# Patient Record
Sex: Male | Born: 1945 | Race: White | Hispanic: No | Marital: Married | Smoking: Former smoker
Health system: Southern US, Community
[De-identification: ages and names within clinical notes are randomized; demographics above are authoritative.]

## PROBLEM LIST (undated history)

## (undated) DIAGNOSIS — Z95810 Presence of automatic (implantable) cardiac defibrillator: Secondary | ICD-10-CM

## (undated) DIAGNOSIS — I428 Other cardiomyopathies: Secondary | ICD-10-CM

## (undated) DIAGNOSIS — I499 Cardiac arrhythmia, unspecified: Secondary | ICD-10-CM

## (undated) DIAGNOSIS — K432 Incisional hernia without obstruction or gangrene: Secondary | ICD-10-CM

## (undated) DIAGNOSIS — I4819 Other persistent atrial fibrillation: Secondary | ICD-10-CM

## (undated) DIAGNOSIS — I219 Acute myocardial infarction, unspecified: Secondary | ICD-10-CM

## (undated) DIAGNOSIS — I2510 Atherosclerotic heart disease of native coronary artery without angina pectoris: Secondary | ICD-10-CM

## (undated) DIAGNOSIS — F32A Depression, unspecified: Secondary | ICD-10-CM

## (undated) DIAGNOSIS — Z950 Presence of cardiac pacemaker: Secondary | ICD-10-CM

## (undated) DIAGNOSIS — I509 Heart failure, unspecified: Secondary | ICD-10-CM

## (undated) DIAGNOSIS — I739 Peripheral vascular disease, unspecified: Secondary | ICD-10-CM

## (undated) DIAGNOSIS — N189 Chronic kidney disease, unspecified: Secondary | ICD-10-CM

## (undated) DIAGNOSIS — E785 Hyperlipidemia, unspecified: Secondary | ICD-10-CM

## (undated) DIAGNOSIS — N529 Male erectile dysfunction, unspecified: Secondary | ICD-10-CM

## (undated) DIAGNOSIS — F419 Anxiety disorder, unspecified: Secondary | ICD-10-CM

## (undated) DIAGNOSIS — D649 Anemia, unspecified: Secondary | ICD-10-CM

## (undated) DIAGNOSIS — F329 Major depressive disorder, single episode, unspecified: Secondary | ICD-10-CM

## (undated) DIAGNOSIS — I255 Ischemic cardiomyopathy: Secondary | ICD-10-CM

## (undated) DIAGNOSIS — K219 Gastro-esophageal reflux disease without esophagitis: Secondary | ICD-10-CM

## (undated) DIAGNOSIS — IMO0001 Reserved for inherently not codable concepts without codable children: Secondary | ICD-10-CM

## (undated) HISTORY — DX: Other cardiomyopathies: I42.8

## (undated) HISTORY — DX: Other persistent atrial fibrillation: I48.19

## (undated) HISTORY — DX: Hyperlipidemia, unspecified: E78.5

## (undated) HISTORY — DX: Major depressive disorder, single episode, unspecified: F32.9

## (undated) HISTORY — PX: CORONARY STENT PLACEMENT: SHX1402

## (undated) HISTORY — DX: Depression, unspecified: F32.A

## (undated) HISTORY — DX: Male erectile dysfunction, unspecified: N52.9

## (undated) HISTORY — PX: MULTIPLE TOOTH EXTRACTIONS: SHX2053

## (undated) HISTORY — DX: Ischemic cardiomyopathy: I25.5

## (undated) HISTORY — DX: Chronic kidney disease, unspecified: N18.9

## (undated) HISTORY — PX: BREAST LUMPECTOMY: SHX2

## (undated) HISTORY — PX: INSERT / REPLACE / REMOVE PACEMAKER: SUR710

## (undated) HISTORY — DX: Acute myocardial infarction, unspecified: I21.9

## (undated) HISTORY — DX: Atherosclerotic heart disease of native coronary artery without angina pectoris: I25.10

## (undated) NOTE — *Deleted (*Deleted)
Discharge Summary  [name] VHQ:[id number] DOB: [birth year]  PCP: Gwenlyn Found, MD  Admit date: 08/25/2020 Discharge date: 09/26/2020  Time spent: , more than 50% time spent on coordination of care.   Recommendations for Outpatient Follow-up:  1. F/u with PCP within a week  for hospital discharge follow up, repeat cbc/bmp at follow up 2. Insurance denied inpatient rehab, patient declined snf placement 3. Maximize home health /home equipment  Discharge Diagnoses:  Active Hospital Problems   Diagnosis Date Noted  . Pneumonia due to COVID-19 virus 08/26/2020  . Acute respiratory failure with hypoxia (HCC)   . CKD (chronic kidney disease) stage 3, GFR 30-59 ml/min (HCC) 02/18/2016  . Nonischemic cardiomyopathy (HCC)   . Sleep apnea 04/05/2011  . CAD S/P LAD PCI- 2000 03/09/2010  . Essential hypertension 03/09/2010  . Atrial fibrillation (HCC) 03/09/2010  . Hyperlipidemia 03/09/2010  . Chronic systolic heart failure (HCC) 03/09/2010    Resolved Hospital Problems  No resolved problems to display.    Discharge Condition: stable  Diet recommendation: heart healthy/carb modified  Filed Weights   09/23/20 0525 09/25/20 0425 09/26/20 0500  Weight: 79.4 kg 79.8 kg 79.9 kg    History of present illness:  [facility] Course:  Principal Problem:   Pneumonia due to COVID-19 virus Active Problems:   Essential hypertension   CAD S/P LAD PCI- 2000   Atrial fibrillation (HCC)   Chronic systolic heart failure (HCC)   Sleep apnea   Nonischemic cardiomyopathy (HCC)   CKD (chronic kidney disease) stage 3, GFR 30-59 ml/min (HCC)   Acute respiratory failure with hypoxia (HCC)   Hyperlipidemia   Procedures:  *  Consultations:  *  Discharge Exam: BP 104/61 (BP Location: Right Arm)   Pulse 69   Temp (!) 97.4 F (36.3 C) (Oral)   Resp 16   Ht 5\' 10"  (1.778 m)   Wt 79.9 kg   SpO2 98%   BMI 25.27 kg/m   General: * Cardiovascular: * Respiratory: *   Discharge Instructions You were cared for by a hospitalist during your hospital stay. If you have any questions about your discharge medications or the care you received while you were in the hospital after you are discharged, you can call the unit and asked to speak with the hospitalist on call if the hospitalist that took care of you is not available. Once you are discharged, your primary care physician will handle any further medical issues. Please note that NO REFILLS for any discharge medications will be authorized once you are discharged, as it is imperative that you return to your primary care physician (or establish a relationship with a primary care physician if you do not have one) for your aftercare needs so that they can reassess your need for medications and monitor your lab values.  Discharge Instructions    Diet - low sodium heart healthy   Complete by: As directed    Increase activity slowly   Complete by: As directed      Allergies as of 09/26/2020      Reactions   Keflex [cephalexin] Other (See Comments)   dizziness    Med Rec must be completed prior to using this [facility]***        Durable Medical Equipment  (From admission, onward)         Start     Ordered   09/26/20 0931  DME 3-in-1  Once        09/26/20 0930  09/26/20 0930  DME Walker  Once       Question Answer Comment  Walker: With 5 Inch Wheels   Patient needs a walker to treat with the following condition SOB (shortness of breath)      09/26/20 0930   09/26/20 0925  For home use only DME oxygen  Once       Question Answer Comment  Length of Need 6 Months   Frequency Continuous (stationary and portable oxygen unit needed)   Oxygen conserving device Yes   Oxygen delivery system Gas      09/26/20 0924         Allergies  Allergen Reactions  . Keflex [Cephalexin] Other (See Comments)    dizziness    Follow-up Information    Home[facility] Follow up.   Specialty: Home Health Services  Why: HHPT Contact information: [address] [place] [address] [phone]        Gwenlyn Found, MD Follow up in 1 week(s).   Specialty: Family Medicine Why: hospital discharge follow up, repeat basic labs ( cbc/bmp ) at hospital discharge follow up pcp to repeat chest x ray in 2-3 weeks to ensure resolution of lung infiltrate.  Contact information: [address] [place] [address] [phone]        [place], Peter M, MD .   Specialty: Cardiology Contact information: [address] [place] [address] [phone]        Duke Salvia, MD .   Specialty: Cardiology Contact information: [phone] [address] [place] [address] [phone]                The results of significant diagnostics from this hospitalization (including imaging, microbiology, ancillary and laboratory) are listed below for reference.    Significant Diagnostic Studies: CT ANGIO CHEST PE W OR WO CONTRAST  Result Date: 09/01/2020 CLINICAL DATA:  COVID positive, positive D-dimer, PE suspected. EXAM: CT ANGIOGRAPHY CHEST WITH CONTRAST TECHNIQUE: Multidetector CT imaging of the chest was performed using the standard protocol during bolus administration of intravenous contrast. Multiplanar CT image reconstructions and MIPs were obtained to evaluate the vascular anatomy. CONTRAST:  OMNIPAQUE IOHEXOL 350 MG/ML SOLN COMPARISON:  Chest x-ray 08/31/2020, CT angio chest 02/22/2016 FINDINGS: Lines: Right chest wall three lead cardiac defibrillator and pacemaker. Cardiovascular: Satisfactory opacification of the pulmonary arteries to the segmental level. No evidence of pulmonary embolism. Prominent right atrium and left ventricle. No significant pericardial effusion. At least moderate 4 vessel coronary artery calcifications. 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 Mediastinum/Nodes: Palpable of prominent, likely reactive mediastinal lymph nodes with as an example a 1.2 cm right paratracheal lymph node (6:101) and a 1 cm precarinal lymph node on the left (6:109). There is suggestion of a 1 cm right infrahilar lymph node (6:159). As well as suggestion of a prominent left infrahilar lymph node (05/29/2040). No axillary lymph nodes. Thyroid gland, trachea, and esophagus demonstrate no significant findings. Lungs/Pleura: Diffuse, bilateral lower lobe and left upper lobe predominant, peripheral and peribronchovascular patchy and consolidative airspace opacities. No pleural effusion or pneumothorax bilaterally. Upper Abdomen: Partially visualized 4.1 cm fluid density lesion within the left kidney likely represents a renal cyst. Musculoskeletal: No chest wall abnormality No suspicious lytic or blastic osseous lesions. No acute displaced fracture. Multilevel degenerative changes of the spine. Review of the MIP images confirms  the above findings. IMPRESSION: 1. No central or segmental pulmonary embolus. Limited evaluation of the subsegmental level due to timing of contrast. 2. Diffuse pulmonary findings consistent with COVID infection with likely reactive infrahilar and mediastinal lymphadenopathy. 3. 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 4.  Aortic Atherosclerosis (ICD10-I70.0). Electronically Signed   By: Tish Frederickson M.D.   On: 09/01/2020 21:51   DG CHEST PORT 1 VIEW  Result Date: 09/08/2020 CLINICAL DATA:  Shortness of breath, COVID-19 positive EXAM: PORTABLE CHEST 1 VIEW COMPARISON:  08/31/2020, 09/01/2020 FINDINGS: Right-sided implanted cardiac device, unchanged. Cardiomegaly. Aortic atherosclerosis. Diffuse airspace consolidation throughout the left lung has become slightly more consolidative compared to the prior x-ray. Progressive predominantly interstitial opacities  throughout the right lung. No large pleural fluid collection. No pneumothorax identified. IMPRESSION: Slight interval worsening of diffuse bilateral airspace opacities, left greater than right. Electronically Signed   By: Duanne Guess D.O.   On: 09/08/2020 10:07   DG CHEST PORT 1 VIEW  Result Date: 08/31/2020 CLINICAL DATA:  Dyspnea. EXAM: PORTABLE CHEST 1 VIEW COMPARISON:  08/25/2020 FINDINGS: Right chest wall ICD is noted with leads in the right atrial appendage, coronary sinus and right ventricle. Stable cardiomediastinal contours. Aortic atherosclerotic calcifications noted. Diffuse airspace and interstitial opacities in the left lung have increased in the interval. New, diffuse interstitial opacities within the right lung are noted on today's exam. IMPRESSION: 1. Progressive interstitial and airspace opacities throughout the left lung with new diffuse interstitial opacities in the right lung. Electronically Signed   By: Signa Kell M.D.   On: 08/31/2020 09:36   VAS [place] LOWER EXTREMITY VENOUS (DVT)  Result Date: 09/01/2020  Lower Venous DVTStudy Indications: Elevated D dimer.  Risk Factors: + Covid. Performing Technologist: Jannet Askew RCT RDMS  Examination Guidelines: A complete evaluation includes B-mode imaging, spectral Doppler, color Doppler, and power Doppler as needed of all accessible portions of each vessel. Bilateral testing is considered an integral part of a complete examination. Limited examinations for reoccurring indications may be performed as noted. The reflux portion of the exam is performed with the patient in reverse Trendelenburg.  +---------+---------------+---------+-----------+----------+--------------+ RIGHT    CompressibilityPhasicitySpontaneityPropertiesThrombus Aging +---------+---------------+---------+-----------+----------+--------------+ CFV      Full           Yes      Yes                                  +---------+---------------+---------+-----------+----------+--------------+ SFJ      Full                                                        +---------+---------------+---------+-----------+----------+--------------+ FV Prox  Full                                                        +---------+---------------+---------+-----------+----------+--------------+ FV Mid   Full                                                        +---------+---------------+---------+-----------+----------+--------------+  FV DistalFull                                                        +---------+---------------+---------+-----------+----------+--------------+ PFV      Full                                                        +---------+---------------+---------+-----------+----------+--------------+ POP      Full           Yes      Yes                                 +---------+---------------+---------+-----------+----------+--------------+ PTV      Full                                                        +---------+---------------+---------+-----------+----------+--------------+ PERO     None                                         Acute          +---------+---------------+---------+-----------+----------+--------------+   +---------+---------------+---------+-----------+----------+--------------+ LEFT     CompressibilityPhasicitySpontaneityPropertiesThrombus Aging +---------+---------------+---------+-----------+----------+--------------+ CFV      Full           Yes      Yes                                 +---------+---------------+---------+-----------+----------+--------------+ SFJ      Full                                                        +---------+---------------+---------+-----------+----------+--------------+ FV Prox  Full                                                         +---------+---------------+---------+-----------+----------+--------------+ FV Mid   Full                                                        +---------+---------------+---------+-----------+----------+--------------+ FV DistalFull                                                        +---------+---------------+---------+-----------+----------+--------------+  PFV      Full                                                        +---------+---------------+---------+-----------+----------+--------------+ POP      Full           Yes      Yes                                 +---------+---------------+---------+-----------+----------+--------------+ PTV      Full                                                        +---------+---------------+---------+-----------+----------+--------------+ PERO     None                                         Acute          +---------+---------------+---------+-----------+----------+--------------+     Summary: RIGHT: - Findings consistent with acute deep vein thrombosis involving the right peroneal veins. - No cystic structure found in the popliteal fossa.  LEFT: - Findings consistent with acute deep vein thrombosis involving the left peroneal veins. - No cystic structure found in the popliteal fossa.  *See table(s) above for measurements and observations. Electronically signed by Gretta Began MD on 09/01/2020 at 5:00:44 PM.    Final    ECHOCARDIOGRAM LIMITED  Result Date: 09/17/2020    ECHOCARDIOGRAM LIMITED REPORT   Patient Name:   TRAEGER SULTANA [facility] Date of Exam: 09/17/2020 Medical Rec #:  [id number]       Height:       70.0 in Accession #:    [id number]      Weight:       180.8 lb Date of Birth:  08-13-1946       BSA:          2.000 m Patient Age:    73 years        BP:           104/59 mmHg Patient Gender: M               HR:           72 bpm. Exam Location:  Inpatient Procedure: Limited Echo, Intracardiac Opacification Agent, Cardiac  Doppler and            Limited Color Doppler Indications:    Acute Respiratory Insufficiency 518.82 / R06.89  History:        Patient has prior history of Echocardiogram examinations, most                 recent 10/01/2017. CHF, CAD and Previous Myocardial Infarction,                 Defibrillator, PAD, Arrythmias:Atrial Fibrillation; Risk                 Factors:Dyslipidemia. Rencent COVID pneumon, Ischemic  cardiomyopathy, Chronic renal insufficiency.  Sonographer:    Leta Jungling RDCS Referring Phys: [id number] [facility]  Sonographer Comments: Technically challenging study due to limited acoustic windows. Image acquisition challenging due to respiratory motion. IMPRESSIONS  1. There is akinesis of all mid-to-distal septal, mid-to-distal inferior, mid-to-distal anterior and apical segments. All other segments are severely hypokinetic.Marland Kitchen Left ventricular ejection fraction, by estimation, is 25 to 30%. The left ventricle has severely decreased function. The left ventricular internal cavity size was mildly dilated.  2. Right ventricular systolic function is normal. The right ventricular size is normal. There is mildly elevated pulmonary artery systolic pressure.  3. The aortic valve is tricuspid. There is mild calcification of the aortic valve. There is mild thickening of the aortic valve. Aortic valve regurgitation is not visualized. Comparison(s): No significant change from prior study. Compared to prior TTE in 2018, there is no significant change. FINDINGS  Left Ventricle: There is akinesis of all mid-to-distal septal, mid-to-distal inferior, mid-to-distal anterior and apical segments.All other segments are severely hypokinetic. Left ventricular ejection fraction, by estimation, is 25 to 30%. The left ventricle has severely decreased function. The left ventricular internal cavity size was moderately dilated. There is no left ventricular hypertrophy. Right Ventricle: The right ventricular size is  normal. Right ventricular systolic function is normal. There is mildly elevated pulmonary artery systolic pressure. The tricuspid regurgitant velocity is 2.76 m/s, and with an assumed right atrial pressure of 8 mmHg, the estimated right ventricular systolic pressure is 38.5 mmHg. Mitral Valve: There is mild thickening of the mitral valve leaflet(s). Mild mitral annular calcification. Tricuspid Valve: The tricuspid valve is normal in structure. Tricuspid valve regurgitation is mild. Aortic Valve: The aortic valve is tricuspid. There is mild calcification of the aortic valve. There is mild thickening of the aortic valve. Aortic valve regurgitation is not visualized. Pulmonic Valve: The pulmonic valve was grossly normal. Pulmonic valve regurgitation is trivial. Aorta: The aortic root and ascending aorta are structurally normal, with no evidence of dilitation. Additional Comments: A pacer wire is visualized. LEFT VENTRICLE PLAX 2D LVIDd:         6.30 cm      Diastology LVIDs:         4.80 cm      LV e' medial:    6.43 cm/s LV PW:         0.90 cm      LV E/e' medial:  9.5 LV IVS:        0.90 cm      LV e' lateral:   5.40 cm/s                             LV E/e' lateral: 11.3  LV Volumes (MOD) LV vol d, MOD A2C: 184.0 ml LV vol d, MOD A4C: 193.0 ml LV vol s, MOD A2C: 136.0 ml LV vol s, MOD A4C: 143.0 ml LV SV MOD A2C:     48.0 ml LV SV MOD A4C:     193.0 ml LV SV MOD BP:      50.0 ml LEFT ATRIUM         Index LA diam:    4.10 cm 2.05 cm/m  AORTIC VALVE LVOT Vmax:   75.10 cm/s LVOT Vmean:  46.400 cm/s LVOT VTI:    0.096 m MITRAL VALVE               TRICUSPID VALVE MV Area (PHT): 4.14 cm  TR Peak grad:   30.5 mmHg MV Decel Time: 183 msec    TR Vmax:        276.00 cm/s MV E velocity: 61.13 cm/s                            SHUNTS                            Systemic VTI: 0.10 m Laurance Flatten MD Electronically signed by Laurance Flatten MD Signature Date/Time: 09/17/2020/4:36:40 PM    Final     Microbiology: No  results found for this or any previous visit (from the past 240 hour(s)).   Labs: Basic Metabolic Panel: Recent Labs  Lab 09/22/20 0536 09/23/20 0731 09/24/20 0619 09/25/20 0324 09/26/20 0658  NA 142 141 142 140 142  K 3.6 3.8 3.3* 3.0* 3.7  CL 99 99 99 97* 99  CO2 30 27 31 31  32  GLUCOSE 88 88 95 108* 96  BUN 21 16 21 23 21   CREATININE 1.32* 1.40* 1.47* 1.46* 1.52*  CALCIUM 9.5 9.4 9.4 9.4 9.2   Liver Function Tests: No results for input(s): AST, ALT, ALKPHOS, BILITOT, PROT, ALBUMIN in the last 168 hours. No results for input(s): LIPASE, AMYLASE in the last 168 hours. No results for input(s): AMMONIA in the last 168 hours. CBC: Recent Labs  Lab 09/22/20 0536 09/23/20 0952 09/24/20 0619 09/25/20 0324 09/26/20 0658  WBC 8.3 8.7 9.1 8.4 7.8  HGB 11.8* 11.8* 12.1* 11.9* 11.5*  HCT 38.0* 38.9* 38.7* 38.9* 38.0*  MCV 82.8 81.7 82.2 81.7 82.3  PLT 340 237 375 372 385   Cardiac Enzymes: No results for input(s): CKTOTAL, CKMB, CKMBINDEX, TROPONINI in the last 168 hours. BNP: BNP (last 3 results) Recent Labs    08/25/20 1813  BNP 429.0*    ProBNP (last 3 results) No results for input(s): PROBNP in the last 8760 hours.  CBG: No results for input(s): GLUCAP in the last 168 hours.     Signed:  Albertine Grates MD, PhD, FACP  [facility] 09/26/2020, 9:30 AM

---

## 1970-12-19 HISTORY — PX: FRACTURE SURGERY: SHX138

## 1970-12-19 HISTORY — PX: WRIST FRACTURE SURGERY: SHX121

## 1998-12-19 DIAGNOSIS — I219 Acute myocardial infarction, unspecified: Secondary | ICD-10-CM

## 1998-12-19 HISTORY — PX: CORONARY ANGIOPLASTY: SHX604

## 1998-12-19 HISTORY — DX: Acute myocardial infarction, unspecified: I21.9

## 2006-04-05 ENCOUNTER — Encounter (INDEPENDENT_AMBULATORY_CARE_PROVIDER_SITE_OTHER): Payer: Self-pay | Admitting: *Deleted

## 2006-04-05 ENCOUNTER — Ambulatory Visit (HOSPITAL_COMMUNITY): Admission: RE | Admit: 2006-04-05 | Discharge: 2006-04-05 | Payer: Self-pay | Admitting: Gastroenterology

## 2007-12-20 HISTORY — PX: CARDIAC DEFIBRILLATOR PLACEMENT: SHX171

## 2008-02-01 ENCOUNTER — Ambulatory Visit: Payer: Self-pay | Admitting: Internal Medicine

## 2008-02-05 ENCOUNTER — Inpatient Hospital Stay (HOSPITAL_BASED_OUTPATIENT_CLINIC_OR_DEPARTMENT_OTHER): Admission: RE | Admit: 2008-02-05 | Discharge: 2008-02-05 | Payer: Self-pay | Admitting: *Deleted

## 2008-02-05 HISTORY — PX: CARDIAC CATHETERIZATION: SHX172

## 2008-02-14 ENCOUNTER — Ambulatory Visit: Payer: Self-pay | Admitting: Internal Medicine

## 2008-02-14 ENCOUNTER — Ambulatory Visit (HOSPITAL_COMMUNITY): Admission: RE | Admit: 2008-02-14 | Discharge: 2008-02-15 | Payer: Self-pay | Admitting: Internal Medicine

## 2008-02-27 ENCOUNTER — Ambulatory Visit: Payer: Self-pay

## 2008-04-22 ENCOUNTER — Ambulatory Visit: Payer: Self-pay | Admitting: Internal Medicine

## 2008-05-08 ENCOUNTER — Ambulatory Visit: Payer: Self-pay

## 2008-05-08 ENCOUNTER — Encounter: Payer: Self-pay | Admitting: Internal Medicine

## 2008-07-10 ENCOUNTER — Encounter (HOSPITAL_COMMUNITY): Admission: RE | Admit: 2008-07-10 | Discharge: 2008-10-08 | Payer: Self-pay | Admitting: *Deleted

## 2009-02-13 ENCOUNTER — Encounter: Payer: Self-pay | Admitting: Internal Medicine

## 2009-02-17 ENCOUNTER — Ambulatory Visit: Payer: Self-pay | Admitting: Internal Medicine

## 2009-12-04 ENCOUNTER — Encounter: Payer: Self-pay | Admitting: Internal Medicine

## 2009-12-23 ENCOUNTER — Encounter: Payer: Self-pay | Admitting: Internal Medicine

## 2010-03-09 ENCOUNTER — Ambulatory Visit: Payer: Self-pay | Admitting: Internal Medicine

## 2010-03-09 DIAGNOSIS — I2510 Atherosclerotic heart disease of native coronary artery without angina pectoris: Secondary | ICD-10-CM | POA: Insufficient documentation

## 2010-03-09 DIAGNOSIS — I5022 Chronic systolic (congestive) heart failure: Secondary | ICD-10-CM

## 2010-03-09 DIAGNOSIS — N259 Disorder resulting from impaired renal tubular function, unspecified: Secondary | ICD-10-CM | POA: Insufficient documentation

## 2010-03-09 DIAGNOSIS — I4891 Unspecified atrial fibrillation: Secondary | ICD-10-CM | POA: Insufficient documentation

## 2010-03-09 DIAGNOSIS — Z9861 Coronary angioplasty status: Secondary | ICD-10-CM

## 2010-03-09 DIAGNOSIS — I10 Essential (primary) hypertension: Secondary | ICD-10-CM | POA: Insufficient documentation

## 2010-03-09 DIAGNOSIS — E785 Hyperlipidemia, unspecified: Secondary | ICD-10-CM

## 2010-05-26 ENCOUNTER — Encounter: Payer: Self-pay | Admitting: Internal Medicine

## 2010-05-26 ENCOUNTER — Ambulatory Visit: Payer: Self-pay

## 2010-08-05 ENCOUNTER — Ambulatory Visit: Payer: Self-pay | Admitting: Cardiology

## 2010-08-30 ENCOUNTER — Ambulatory Visit: Payer: Self-pay

## 2010-08-30 ENCOUNTER — Encounter: Payer: Self-pay | Admitting: Internal Medicine

## 2010-09-02 ENCOUNTER — Ambulatory Visit: Payer: Self-pay | Admitting: Cardiology

## 2010-09-30 ENCOUNTER — Ambulatory Visit: Payer: Self-pay | Admitting: Cardiology

## 2010-10-28 ENCOUNTER — Ambulatory Visit: Payer: Self-pay | Admitting: Cardiology

## 2010-11-24 ENCOUNTER — Ambulatory Visit: Payer: Self-pay

## 2010-11-25 ENCOUNTER — Ambulatory Visit: Payer: Self-pay | Admitting: Cardiology

## 2010-12-19 HISTORY — PX: TRANSTHORACIC ECHOCARDIOGRAM: SHX275

## 2011-01-18 NOTE — Cardiovascular Report (Signed)
Summary: Office Visit   Office Visit   Imported By: Roderic Ovens 06/04/2010 12:00:11  _____________________________________________________________________  External Attachment:    Type:   Image     Comment:   External Document

## 2011-01-18 NOTE — Letter (Signed)
Summary: [facility]   Imported By: Kassie Mends 01/27/2010 09:20:23  _____________________________________________________________________  External Attachment:    Type:   Image     Comment:   External Document

## 2011-01-18 NOTE — Procedures (Signed)
Summary: device/saf   Current Medications (verified): 1)  Coreg 6.25 Mg Tabs (Carvedilol) .... Take One Tablet Once Daily 2)  Furosemide 40 Mg Tabs (Furosemide) .... Take One Tablet Once Daily 3)  Potassium Chloride Crys Cr 20 Meq Cr-Tabs (Potassium Chloride Crys Cr) .... Take 1/2 Tablet Once Daily 4)  Zoloft 100 Mg Tabs (Sertraline Hcl) .... Once Daily 5)  Tricor 145 Mg Tabs (Fenofibrate) .... Take One Tablet Once Daily 6)  Simvastatin 40 Mg Tabs (Simvastatin) .... Take One Tablet Once Daily 7)  Coumadin 5 Mg Tabs (Warfarin Sodium) .... Take 2.5 Mg 5 Days Weekly, Take 5 Mg The Remaining 2 Days 8)  Aspirin 81 Mg Tabs (Aspirin) .... Take One Tablet Once Daily 9)  Vitamin C 250 Mg Tabs (Ascorbic Acid) .... Take One Tablet Once Daily 10)  Multivitamins  Tabs (Multiple Vitamin) .... Once Daily 11)  Lisinopril 10 Mg Tabs (Lisinopril) .... Take One Tablet Once Daily 12)  Folic Acid 800 Mcg Tabs (Folic Acid) .... Once Daily 13)  Eplerenone 25 Mg Tabs (Eplerenone) .... Take One Tablet Daily 14)  Pacerone 100 Mg Tabs (Amiodarone Hcl) .... Take One Tablet Once Daily 15)  Lorazepam 1 Mg Tabs (Lorazepam) .... As Needed 16)  Nitrostat 0.4 Mg Subl (Nitroglycerin) .... Uad  Allergies (verified): No Known Drug Allergies   ICD Specifications Following MD:  Sherryl Manges, MD     ICD Vendor:  [employer]     ICD Model Number:  [id number]     ICD Serial Number:  [id number] ICD DOI:  0226/2009     ICD Implanting MD:  Sherryl Manges, MD  Lead 1:    Location: RA     DOI: 0226/2009     Model #: [id number]     Serial #: [id number]     Status: active Lead 2:    Location: RV     DOI: 02/14/2008     Model #: [id number]     Serial #: [id number]     Status: active Lead 3:    Location: LV     DOI: 02/14/2008     Model #: [id number]     Serial #: [id number]     Status: active  Indications::  ICM&NICM  Explantation Comments: Follows with Dr. Reyes Ivan  ICD Follow Up Remote Check?  No Battery Voltage:  3.11 V     Charge Time:  10.0 seconds      Underlying rhythm:  SR ICD Dependent:  No       ICD Device Measurements Atrium:  Amplitude: 3.0 mV, Impedance: 520 ohms, Threshold: 0.5 V at 0.4 msec Right Ventricle:  Amplitude: 13.8 mV, Impedance: 424 ohms, Threshold: 1.0 V at 0.6 msec Left Ventricle:  Impedance: 832 ohms, Threshold: 1.5 V at 0.4 msec Shock Impedance: 56/72 ohms   Episodes MS Episodes:  0     Percent Mode Switch:  0     Coumadin:  Yes Shock:  0     ATP:  0     Nonsustained:  0     Atrial Therapies:  0 Atrial Pacing:  3%     Ventricular Pacing:  99%  Brady Parameters Mode DDD     Lower Rate Limit:  50     Upper Rate Limit 130 PAV 130     Sensed AV Delay:  80  Tachy Zones VF:  200     VT:  176     Tech Comments:  NORMAL DEVICE FUNCTION.  INCREASE IN OPTIVOL FROM 05-14-10 AND  ONGOING. PT GIVEN INSTRUCTIONS TO INCREASE LASIX AND POTASSIUM IF NOTICE 2 LB WEIGHT INCREASE DAILY OR 5 LB INCREASE WEEKLY.  NO CHANGES MADE.  Vella Kohler  May 26, 2010 11:31 AM

## 2011-01-18 NOTE — Assessment & Plan Note (Signed)
Summary: defib [email or web address]   CC:  defib Curator.  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 He is status post CRT-D for the above.  He also has atrial fibrillation previously and now again on amiodarone.  surveillance laboratories have not been drawn recently.    Current Medications (verified): 1)  Coreg 6.25 Mg Tabs (Carvedilol) .... Take One Tablet Once Daily 2)  Furosemide 40 Mg Tabs (Furosemide) .... Take One Tablet Once Daily 3)  Potassium Chloride Crys Cr 20 Meq Cr-Tabs (Potassium Chloride Crys Cr) .... Take 1/2 Tablet Once Daily 4)  Zoloft 100 Mg Tabs (Sertraline Hcl) .... Once Daily 5)  Tricor 145 Mg Tabs (Fenofibrate) .... Take One Tablet Once Daily 6)  Simvastatin 40 Mg Tabs (Simvastatin) .... Take One Tablet Once Daily 7)  Coumadin 5 Mg Tabs (Warfarin Sodium) .... Take 2.5 Mg 5 Days Weekly, Take 5 Mg The Remaining 2 Days 8)  Aspirin 81 Mg Tabs (Aspirin) .... Take One Tablet Once Daily 9)  Vitamin C 250 Mg Tabs (Ascorbic Acid) .... Take One Tablet Once Daily 10)  Multivitamins  Tabs (Multiple Vitamin) .... Once Daily 11)  Lisinopril 10 Mg Tabs (Lisinopril) .... Take One Tablet Once Daily 12)  Folic Acid 800 Mcg Tabs (Folic Acid) .... Once Daily 13)  Eplerenone 25 Mg Tabs (Eplerenone) .... Take One Tablet Daily 14)  Pacerone 100 Mg Tabs (Amiodarone Hcl) .... Take One Tablet Once Daily 15)  Lorazepam 1 Mg Tabs (Lorazepam) .... As Needed 16)  Nitrostat 0.4 Mg Subl (Nitroglycerin) .... Uad  Allergies (verified): No Known Drug Allergies  Past History:  Past Surgical History: Last updated: 03/09/2010 ICD placement-Medtronic Concerto [id number]  Family History: Last updated: 03/09/2010  Positive for heart disease with his father who died at   age 64 from a heart attack.  His mother had breast cancer and   depression.   Social History: Last  updated: 03/09/2010  He is married.  He smoked x34 pack years.  He quit 8   years ago.  No alcohol or caffeine.      Past Medical History:  1. History of anterior wall myocardial infarction 2000.       a.     Emergent stent to the proximal LAD.   2. Ischemia of cardiomyopathy. Echocardiogram April 2008.  Ejection       fraction of 30-35%. -Medtronic Concerto [id number]      a.     Myoview study January 03, 2008. 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   3. Hypertension.   4. Mild renal insufficiency.   5. Dyslipidemia.   6. New York Heart Association Class II chronic systolic congestive       heart failure.  7. history of alcohol and cigarette addictions     Vital Signs:  Patient profile:   65 year old male Height:      71 inches Weight:      240 pounds BMI:     33.59 Pulse rate:   67 / minute Pulse rhythm:   regular BP sitting:   132 / 66  (left arm) Cuff size:   regular  Vitals Entered By: Judithe Modest CMA (March 09, 2010 2:02 PM)  Physical Exam  General:  The patient  was alert and oriented in no acute distress. HEENT Normal.  Neck veins were flat, carotids were brisk.  Lungs were clear.  Heart sounds were regular without murmurs or gallops.  Abdomen was soft with active bowel sounds. There is no clubbing cyanosis or edema. Skin Warm and dry     ICD Specifications Following MD:  Sherryl Manges, MD     ICD Vendor:  Medtronic     ICD Model Number:  [phone]     ICD Serial Number:  [id number] ICD DOI:  0226/2009     ICD Implanting MD:  Sherryl Manges, MD  Lead 1:    Location: RA     DOI: 0226/2009     Model #: 1324     Serial #: [id number]     Status: active Lead 2:    Location: RV     DOI: 02/14/2008     Model #: 6440     Serial #: [id number]     Status: active Lead 3:    Location: LV     DOI: 02/14/2008     Model #: 4194     Serial #: [id number]     Status: active  Indications::   ICM&NICM  Explantation Comments: Follows with Dr. Reyes Ivan  ICD Follow Up Remote Check?  No Battery Voltage:  3.11 V     Charge Time:  10.0 seconds     Underlying rhythm:  SR ICD Dependent:  No       ICD Device Measurements Atrium:  Amplitude: 3.6 mV, Impedance: 512 ohms, Threshold: 0.5 V at 0.4 msec Right Ventricle:  Amplitude: 13.8 mV, Impedance: 424 ohms, Threshold: 1.0 V at 0.6 msec Left Ventricle:  Impedance: 768 ohms, Threshold: 1.0 V at 0.4 msec Shock Impedance: 54/62 ohms   Episodes MS Episodes:  41     Percent Mode Switch:  <0.1%     Coumadin:  Yes Shock:  0     ATP:  0     Nonsustained:  0     Atrial Pacing:  1.8%     Ventricular Pacing:  100%  Brady Parameters Mode DDD     Lower Rate Limit:  50     Upper Rate Limit 130 PAV 130     Sensed AV Delay:  80  Tachy Zones VF:  200     VT:  176     Next Cardiology Appt Due:  05/19/2010 Tech Comments:  RV 2.5@0 .4.  [name] will follow with [place] for his EP care.  He will let us know if he wants to do [facility].  At this point we will schedule him to be seen in the clinic in 3 months. Altha Harm, LPN  March 09, 2010 2:18 PM   Impression & Recommendations:  Problem # 1:  IMPLANTATION OF DEFIBRILLATOR-MEDTRONIC CONCERTO [id number] (ICD-V45.02) Device parameters and data were reviewed and no changes were made  Problem # 2:  CHF (ICD-428.0)  His updated medication list for this problem includes:    Coreg 6.25 Mg Tabs (Carvedilol) .Marland Kitchen... Take one tablet once daily    Furosemide 40 Mg Tabs (Furosemide) .Marland Kitchen... Take one tablet once daily    Coumadin 5 Mg Tabs (Warfarin sodium) .Marland Kitchen... Take 2.5 mg 5 days weekly, take 5 mg the remaining 2 days    Aspirin 81 Mg Tabs (Aspirin) .Marland Kitchen... Take one tablet once daily    Lisinopril 10 Mg Tabs (Lisinopril) .Marland Kitchen... Take one tablet once daily    Pacerone 100 Mg Tabs (Amiodarone hcl) .Marland KitchenMarland KitchenMarland KitchenMarland Kitchen  Take one tablet once daily    Nitrostat 0.4 Mg Subl (Nitroglycerin) ..... Uad  Problem # 3:  CARDIOMYOPATHY, ISCHEMIC  AND NONISCHEMIC (ICD-414.8) stable on current medications His updated medication list for this problem includes:    Coreg 6.25 Mg Tabs (Carvedilol) .Marland Kitchen... Take one tablet once daily    Furosemide 40 Mg Tabs (Furosemide) .Marland Kitchen... Take one tablet once daily    Coumadin 5 Mg Tabs (Warfarin sodium) .Marland Kitchen... Take 2.5 mg 5 days weekly, take 5 mg the remaining 2 days    Aspirin 81 Mg Tabs (Aspirin) .Marland Kitchen... Take one tablet once daily    Lisinopril 10 Mg Tabs (Lisinopril) .Marland Kitchen... Take one tablet once daily    Pacerone 100 Mg Tabs (Amiodarone hcl) .Marland Kitchen... Take one tablet once daily    Nitrostat 0.4 Mg Subl (Nitroglycerin) ..... Uad  Problem # 4:  ATRIAL FIBRILLATION (ICD-427.31) atrial fibrillation remains quiet back on amiodarone. Surveillance laboratories are deferred to Dr.Jordan His updated medication list for this problem includes:    Coreg 6.25 Mg Tabs (Carvedilol) .Marland Kitchen... Take one tablet once daily    Coumadin 5 Mg Tabs (Warfarin sodium) .Marland Kitchen... Take 2.5 mg 5 days weekly, take 5 mg the remaining 2 days    Aspirin 81 Mg Tabs (Aspirin) .Marland Kitchen... Take one tablet once daily    Pacerone 100 Mg Tabs (Amiodarone hcl) .Marland Kitchen... Take one tablet once daily  Patient Instructions: 1)  Your physician recommends that you schedule a follow-up appointment in:  3 months with device clinic.  2)  Your physician recommends that you continue on your current medications as directed. Please refer to the Current Medication list given to you today.

## 2011-01-18 NOTE — Letter (Signed)
Summary: [facility] Office Note  [facility] Office Note   Imported By: Roderic Ovens 03/26/2010 16:07:58  _____________________________________________________________________  External Attachment:    Type:   Image     Comment:   External Document

## 2011-01-18 NOTE — Procedures (Signed)
Summary: DEVICE/SAF   Current Medications (verified): 1)  Coreg 6.25 Mg Tabs (Carvedilol) .... Take One Tablet Once Daily 2)  Furosemide 40 Mg Tabs (Furosemide) .... Take One Tablet Once Daily 3)  Potassium Chloride Crys Cr 20 Meq Cr-Tabs (Potassium Chloride Crys Cr) .... Take 1/2 Tablet Once Daily 4)  Zoloft 100 Mg Tabs (Sertraline Hcl) .... Once Daily 5)  Tricor 145 Mg Tabs (Fenofibrate) .... Take One Tablet Once Daily 6)  Coumadin 5 Mg Tabs (Warfarin Sodium) .... Take 2.5 Mg 5 Days Weekly, Take 5 Mg The Remaining 2 Days 7)  Aspirin 81 Mg Tabs (Aspirin) .... Take One Tablet Once Daily 8)  Vitamin C 250 Mg Tabs (Ascorbic Acid) .... Take One Tablet Once Daily 9)  Multivitamins  Tabs (Multiple Vitamin) .... Once Daily 10)  Lisinopril 10 Mg Tabs (Lisinopril) .... Take One Tablet Once Daily 11)  Folic Acid 800 Mcg Tabs (Folic Acid) .... Once Daily 12)  Eplerenone 25 Mg Tabs (Eplerenone) .... Take One Tablet Daily 13)  Pacerone 100 Mg Tabs (Amiodarone Hcl) .... Take One Tablet Once Daily 14)  Lorazepam 1 Mg Tabs (Lorazepam) .... As Needed 15)  Nitrostat 0.4 Mg Subl (Nitroglycerin) .... Uad 16)  Pravastatin Sodium 20 Mg Tabs (Pravastatin Sodium) .... Take 1 Tablet By Mouth Once Daily 17)  Digoxin 0.125 Mg Tabs (Digoxin) .... Take 1 Tablet By Mouth Once Daily 18)  Omeprazole 40 Mg Cpdr (Omeprazole) .... Take 1 Capsule By Mouth Once Daily  Allergies (verified): No Known Drug Allergies   ICD Specifications Following MD:  Sherryl Manges, MD     ICD Vendor:  Medtronic     ICD Model Number:  [phone]     ICD Serial Number:  [id number] ICD DOI:  0226/2009     ICD Implanting MD:  Sherryl Manges, MD  Lead 1:    Location: RA     DOI: 0226/2009     Model #: 2130     Serial #: [id number]     Status: active Lead 2:    Location: RV     DOI: 02/14/2008     Model #: 9528     Serial #: [id number]     Status: active Lead 3:    Location: LV     DOI: 02/14/2008     Model #: 2725     Serial #: [id number]      Status: active  Indications::  ICM&NICM  Explantation Comments: Follows with Dr. Reyes Ivan  ICD Follow Up Battery Voltage:  3.09 V     Charge Time:  10.4 seconds     Underlying rhythm:  SR ICD Dependent:  No       ICD Device Measurements Atrium:  Amplitude: 4.3 mV, Impedance: 624 ohms, Threshold: 0.5 V at 0.4 msec Right Ventricle:  Amplitude: 14.2 mV, Impedance: 432 ohms, Threshold: 1.0 V at 0.6 msec Left Ventricle:  Impedance: 824 ohms, Threshold: 1.0 V at 0.4 msec Shock Impedance: 58/76 ohms   Episodes MS Episodes:  88     Percent Mode Switch:  0.6%     Coumadin:  Yes Shock:  0     ATP:  0     Nonsustained:  0     Atrial Therapies:  0 Atrial Pacing:  2.6%     Ventricular Pacing:  99.7%  Brady Parameters Mode DDD     Lower Rate Limit:  50     Upper Rate Limit 130 PAV 130     Sensed AV Delay:  80  Tachy Zones VF:  200     VT:  176     Next Cardiology Appt Due:  03/15/2011 Tech Comments:  89 AF EPISODES--LONGEST WAS 3 HRS. + COUMADIN.  NORMAL DEVICE FUNCTION.  NO CHANGES MADE. DEMONSTRATED TONE FOR PT AND PT UNDERSTANDS PROTOCOL.  SPOKE W/PT ABOUT CARELINK AND PT TO THINK ABOUT IT AND WILL LET KNOW AT NEXT VISIT. ROV 03-15-11 W/SK. Vella Kohler  November 24, 2010 10:03 AM   Patient Instructions: 1)  Return office visit with Dr Graciela Husbands in 3 months.

## 2011-01-18 NOTE — Procedures (Signed)
Summary: device/saf   Current Medications (verified): 1)  Coreg 6.25 Mg Tabs (Carvedilol) .... Take One Tablet Once Daily 2)  Furosemide 40 Mg Tabs (Furosemide) .... Take One Tablet Once Daily 3)  Potassium Chloride Crys Cr 20 Meq Cr-Tabs (Potassium Chloride Crys Cr) .... Take 1/2 Tablet Once Daily 4)  Zoloft 100 Mg Tabs (Sertraline Hcl) .... Once Daily 5)  Tricor 145 Mg Tabs (Fenofibrate) .... Take One Tablet Once Daily 6)  Simvastatin 40 Mg Tabs (Simvastatin) .... Take 1/2 Tablet By Mouth Once Daily 7)  Coumadin 5 Mg Tabs (Warfarin Sodium) .... Take 2.5 Mg 5 Days Weekly, Take 5 Mg The Remaining 2 Days 8)  Aspirin 81 Mg Tabs (Aspirin) .... Take One Tablet Once Daily 9)  Vitamin C 250 Mg Tabs (Ascorbic Acid) .... Take One Tablet Once Daily 10)  Multivitamins  Tabs (Multiple Vitamin) .... Once Daily 11)  Lisinopril 10 Mg Tabs (Lisinopril) .... Take One Tablet Once Daily 12)  Folic Acid 800 Mcg Tabs (Folic Acid) .... Once Daily 13)  Eplerenone 25 Mg Tabs (Eplerenone) .... Take One Tablet Daily 14)  Pacerone 100 Mg Tabs (Amiodarone Hcl) .... Take One Tablet Once Daily 15)  Lorazepam 1 Mg Tabs (Lorazepam) .... As Needed 16)  Nitrostat 0.4 Mg Subl (Nitroglycerin) .... Uad  Allergies (verified): No Known Drug Allergies   ICD Specifications Following MD:  Sherryl Manges, MD     ICD Vendor:  Medtronic     ICD Model Number:  [phone]     ICD Serial Number:  [id number] ICD DOI:  0226/2009     ICD Implanting MD:  Sherryl Manges, MD  Lead 1:    Location: RA     DOI: 0226/2009     Model #: 1478     Serial #: [id number]     Status: active Lead 2:    Location: RV     DOI: 02/14/2008     Model #: 5784     Serial #: [id number]     Status: active Lead 3:    Location: LV     DOI: 02/14/2008     Model #: 1324     Serial #: [id number]     Status: active  Indications::  ICM&NICM  Explantation Comments: Follows with Dr. Reyes Ivan  ICD Follow Up Battery Voltage:  3.09 V     Charge Time:  10.4 seconds      Underlying rhythm:  SR ICD Dependent:  No       ICD Device Measurements Atrium:  Amplitude: 4.6 mV, Impedance: 576 ohms, Threshold: 0.5 V at 0.4 msec Right Ventricle:  Amplitude: 14.4 mV, Impedance: 440 ohms, Threshold: 1.0 V at 0.6 msec Left Ventricle:  Impedance: 848 ohms, Threshold: 1.0 V at 0.4 msec Shock Impedance: 59/82 ohms   Episodes MS Episodes:  0     Coumadin:  Yes Shock:  0     ATP:  0     Nonsustained:  0     Atrial Therapies:  0 Atrial Pacing:  3.0%     Ventricular Pacing:  99.9%  Brady Parameters Mode DDD     Lower Rate Limit:  50     Upper Rate Limit 130 PAV 130     Sensed AV Delay:  80  Tachy Zones VF:  200     VT:  176     Next Cardiology Appt Due:  11/18/2010 Tech Comments:  NORMAL DEVICE FUNCTION.  OPTIVOL INCREASE FROM 07-24-10 THRU 08-01-10--PT WAS ON VACATION AND SKIPPED  SOME DOSES OF FUROSEMIDE.  NO EPISODES SINCE LAST CHECK.  TURNED 1:1 SVT ON.  ROV IN 3 MTHS W/DEVICE CLINIC. Vella Kohler  August 30, 2010 9:38 AM

## 2011-01-18 NOTE — Cardiovascular Report (Signed)
Summary: Office Visit   Office Visit   Imported By: Roderic Ovens 03/10/2010 14:17:11  _____________________________________________________________________  External Attachment:    Type:   Image     Comment:   External Document

## 2011-01-18 NOTE — Cardiovascular Report (Signed)
Summary: Office Visit   Office Visit   Imported By: Roderic Ovens 09/13/2010 16:20:29  _____________________________________________________________________  External Attachment:    Type:   Image     Comment:   External Document

## 2011-04-05 ENCOUNTER — Ambulatory Visit (INDEPENDENT_AMBULATORY_CARE_PROVIDER_SITE_OTHER): Payer: 59 | Admitting: Internal Medicine

## 2011-04-05 ENCOUNTER — Encounter: Payer: Self-pay | Admitting: Internal Medicine

## 2011-04-05 DIAGNOSIS — I428 Other cardiomyopathies: Secondary | ICD-10-CM

## 2011-04-05 DIAGNOSIS — I4891 Unspecified atrial fibrillation: Secondary | ICD-10-CM

## 2011-04-05 DIAGNOSIS — I2510 Atherosclerotic heart disease of native coronary artery without angina pectoris: Secondary | ICD-10-CM

## 2011-04-05 DIAGNOSIS — Z95810 Presence of automatic (implantable) cardiac defibrillator: Secondary | ICD-10-CM | POA: Insufficient documentation

## 2011-04-05 DIAGNOSIS — I509 Heart failure, unspecified: Secondary | ICD-10-CM

## 2011-04-05 DIAGNOSIS — G4730 Sleep apnea, unspecified: Secondary | ICD-10-CM

## 2011-04-05 NOTE — Assessment & Plan Note (Signed)
Stable on current medications. We spent more than 40 minutes reviewing the value of his medications in terms of risk reduction of death as well as progression of heart failure. I told did not know about the value of the fenofibrate in this regard.

## 2011-04-05 NOTE — Patient Instructions (Signed)
Your physician recommends that you schedule a follow-up appointment in: 3 MONTHS WITH KRISTIN AND PAULA Your physician has recommended you make the following change in your medication: STOP ASPIRIN

## 2011-04-05 NOTE — Assessment & Plan Note (Signed)
The patient's device was interrogated.  The information was reviewed. No changes were made in the programming.    He may benefit from AV optimization

## 2011-04-05 NOTE — Assessment & Plan Note (Signed)
Pt hsas symtoms consistent with sleep apnea,  Given his concern about the value of testing and procedures we will wait to hear from him about this prior to settig up a sleep study

## 2011-04-05 NOTE — Progress Notes (Signed)
  HPI  [name] is a 65 y.o. male seen in followup for congestive heart failure in the setting of a mixed cardiomyopathy. He is status post CRT-D for the above.  He also has atrial fibrillation previously and now again on amiodarone.  surveillance laboratories have not been drawn recently.    He comes in today extremely frustrated with care, cost of medications, and the lack of improvement in his symptoms despite multiple interventions.  The patient denies chest pain, shortness of breath, nocturnal dyspnea, orthopnea or peripheral edema.  There have been no palpitations, lightheadedness or syncope.   He has significant snoring. He has daytime somnolence and chronic fatigue  No past medical history on file.  No past surgical history on file.  Current Outpatient Prescriptions  Medication Sig Dispense Refill  . amiodarone (PACERONE) 200 MG tablet Take 200 mg by mouth daily.        . Ascorbic Acid (VITAMIN C) 250 MG tablet Take 250 mg by mouth daily.        Marland Kitchen aspirin 81 MG tablet Take 81 mg by mouth daily.        . carvedilol (COREG) 6.25 MG tablet Take 6.25 mg by mouth. 1/2 TAB  BID       . digoxin (LANOXIN) 0.125 MG tablet Take 125 mcg by mouth daily.        Marland Kitchen eplerenone (INSPRA) 25 MG tablet Take 25 mg by mouth daily.        . fenofibrate 160 MG tablet Take 160 mg by mouth daily.        . folic acid (FOLVITE) 800 MCG tablet Take 400 mcg by mouth daily.        . furosemide (LASIX) 40 MG tablet Take 40 mg by mouth 2 (two) times daily. 1/2 tablet         . lisinopril (PRINIVIL,ZESTRIL) 10 MG tablet Take 10 mg by mouth daily.        Marland Kitchen LORazepam (ATIVAN) 1 MG tablet Take 1 mg by mouth every 8 (eight) hours.        . Multiple Vitamins-Minerals (MULTIVITAMIN WITH MINERALS) tablet Take 1 tablet by mouth daily.        . nitroGLYCERIN (NITROSTAT) 0.4 MG SL tablet Place 0.4 mg under the tongue every 5 (five) minutes as needed.        Marland Kitchen omeprazole (PRILOSEC) 40 MG capsule Take 40 mg by  mouth daily.        . potassium chloride (KLOR-CON) 10 MEQ CR tablet Take 10 mEq by mouth daily.        . pravastatin (PRAVACHOL) 40 MG tablet Take 40 mg by mouth daily.        . sertraline (ZOLOFT) 100 MG tablet Take 100 mg by mouth daily.        Marland Kitchen warfarin (COUMADIN) 5 MG tablet Take 5 mg by mouth daily. UAD         No Known Allergies  Review of Systems negative except from HPI and PMH  Physical Exam Well developed and well nourished in no acute distress HENT normal E scleral and icterus clear Neck Supple JVP flat; carotids brisk and full Clear to ausculation Regular rate and rhythm, no murmurs gallops or rub Soft with active bowel sounds No clubbing cyanosis and edema Alert and oriented, grossly normal motor and sensory function Skin Warm and Dry      Assessment and  Plan

## 2011-04-05 NOTE — Assessment & Plan Note (Addendum)
He has had infrequent atrial fibrillation since the resumption of amiodarone. Amiodarone surveillance laboratories are due at his next visit. He is not get his INR checked because of frustrations with cough. I've encouraged him to followup with [name] about this  I've asked him to stop his aspirin

## 2011-04-06 ENCOUNTER — Telehealth: Payer: Self-pay | Admitting: Cardiology

## 2011-04-06 NOTE — Telephone Encounter (Signed)
SAW DR. Seward Speck AND HE TOOK HIM OFF LOW DOSE ASPRIN. HE HAS NOT HAD HIS COUMADIN CHECKED IN A WHILE. HOW LONG DOES HE NEED TO WAIT TO HAVE IT RECHECKED.

## 2011-04-06 NOTE — Telephone Encounter (Signed)
Spoke with patient and apologized for not calling him sooner.  He expressed his concern that he has to pay a co-pay for his coumadin check.  Gave him the option to have primary care doctor check.  He told me about his appointment with Dr. Marikay Alar and that he was advised to stop the low dose asa (81mg ).  He also mentioned other blood thinning medications but that they would be expensive.  He was very nice but had some financial concerns with regard to coumadin checks.  Also to mention he has not had his coumadin checked since December 2011.

## 2011-04-07 NOTE — Telephone Encounter (Signed)
Spoke w/ Mr. Bordas at great length. He has talked w/ Natasha Bence about his co-pay for Coumadin. Has not been checked since Dec. He does not feel he can afford the charge. Per [name] gave him alternative of using Xarelto or Pradaxa or seeing Dr. Doristine Counter to monitor his coumadin.  States he expressed  to Dr. Clide Cliff that he  had the ICD done and adjusts his medicine as instructed but still does not feel any better. Does not know why he needs to be seen 4 times a year "when I do everything the doctors say and I still don't feel any better. Explained to him the importance of being seen due to all of his medical problems and the need to get coumadin checked on regular basis. He will call Dr. Mellody Life office to see what their charge is for INR check. Will relay his concerns to [name]. Is on recall to be seen in July here. Will call us back re coumadin.

## 2011-05-03 NOTE — Letter (Signed)
February 01, 2008    Rosine Abe, M.D.  [address]  [place], [place] [address]   RE:  [name], [name]  MRN:  [id number]  /  DOB:  26-Jun-1946   Dear Aurther Loft:   It was a pleasure to see Buddy Loeffelholz today at your request regarding  ICD implantation.   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  He has had progressive problems with LV dysfunction as  manifested by changing and deteriorating ejection fraction most recently  assessed at 25% as well as progressive functional impairment, short of  breath climbing up from his driveway, short of breath walking behind his  lawnmower for 10 or 15 minutes.  He has not had nocturnal dyspnea.  He  does have occasional peripheral edema.   He has a history of recurrent tachy palpitations that last about 5  minutes accompanied by shortness of breath.  They are without  lightheadedness.  They are frog negative and diuretic negative.   He also has some lightheadedness with exertion as well as some with  orthostasis, the former occurring after walking what is for him  relatively prolonged periods of time, i.e. 5-10 minutes.  He has had no  syncope except something a bizillion years ago.   PAST MEDICAL HISTORY:  In addition to the above is notable for GE reflux  disease, anxiety/depression.   PAST SURGICAL HISTORY:  His past surgical history is notable for  multiple cystectomies and a fracture of his right leg.   SOCIAL HISTORY:  As noted previously he does not use cigarettes, alcohol  or recreational drugs.  He has 2 children, both of whom are in the fire  department in [facility] and they have moved down from [place]  about 4 years ago.   MEDICATIONS:  1. His medications up titration which has been limited by hypotension      include carvedilol 6.25 b.i.d.  2. Digoxin 0.125.  3. Diovan 80.  4. Furosemide 40.  5.  Metolazone 2.5 every other day.  6. Potassium.  7. Protonix.  8. Sertraline.  9. Simvastatin 40.  10.Warfarin 5.  (I failed to ask him why he was on the warfarin).  11.Aspirin.   ALLERGIES:  He has no known drug allergies.   PHYSICAL EXAMINATION:  GENERAL:  He is a middle to older Caucasian male  appearing somewhat older than his stated age of 58.  VITAL SIGNS:  His blood pressure is 101/63.  His pulse was 70.  His  weight was 240.  HEENT:  Demonstrated no icterus or xanthoma.  NECK:  The neck veins were flat.  The carotids were brisk and full  bilaterally without bruits.  BACK:  Without kyphosis, scoliosis.  LUNGS:  Lungs were clear.  HEART:  Sounds were regular with an S4 without significant murmurs.  ABDOMEN:  Soft with active bowel sounds.  It was protuberant.  Femoral  pulses were 2+.  Distal pulses were intact.  EXTREMITIES:  There was no clubbing, cyanosis or edema.  NEUROLOGICAL:  Exam was grossly normal.  SKIN:  His skin was warm and dry.   Electrocardiogram which you kindly sent from your office demonstrated  sinus rhythm with a QRS duration of 130 milliseconds with a left bundle  branch block like appearance although the axis was northwestward.   IMPRESSION:  1. Ischemic cardiomyopathy.  a.     With prior myocardial infarction.      b.     Ejection fraction of 23%.      c.     Negative Myoview.      d.     No recent catheterization.  2. Congestive heart failure - class III.  3. Broad QRS with a northwest axis.  4. Abdominal obesity.  5. Question obstructive sleep apnea.   DISCUSSION:  Aurther Loft, Mr. Rohleder has significant ischemic cardiomyopathy  and concomitant congestive heart failure.  His Myoview is very  reassuring but as we spoke about on the phone a catheterization to  exclude underlying and balanced ischemia would be important prior to  proceed with ICD implantation.   In the event that that is negative proceeding with CRT-D implantation  would be  appropriate given his symptoms, depressed LV function and broad  QRS.  I have reviewed with him and his wife potential benefits as well  as potential risks and limitations including but not limited to death,  perforation, lead dislodgement, infection, lack of response.  He  understands these risks and would like to proceed.   Thank you very much for the consultation.    Sincerely,      Duke Salvia, MD, [facility]  Electronically Signed    SCK/MedQ  DD: 02/01/2008  DT: 02/03/2008  Job #: [id number]   CC:    Evelena Peat, M.D.

## 2011-05-03 NOTE — Op Note (Signed)
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   POSTOPERATIVE DIAGNOSIS:  Ischemic and nonischemic cardiomyopathy with  left bundle branch block and class III congestive failure;  recurrent atrial arrhythmias including atrial flutter with rapid  ventricular response.   Following obtaining informed consent, the patient was brought to the  electrophysiology laboratory and placed on the fluoroscopic table in the  supine position.  After routine prep and drape of his right upper chest,  lidocaine was infiltrated in the prepectoral subclavicular region.  Incision was made and carried down to the layer of the prepectoral  fascia using electrocautery and sharp dissection.  A pocket was formed  similarly.  Hemostasis was obtained.   Thereafter, attention was turned to gaining access to the extrathoracic  right subclavian vein which was accomplished without difficulty without  the aspiration of air or puncture of the artery.  Three separate  venipunctures were accomplished.  Guidewires were placed and retained  and a 0 silk suture was placed in a figure-of-eight fashion and allowed  to hang loosely.   Sequentially a 9-French and 9.5 [place] and 7-French sheath were placed,  through which were passed a Medtronic 6947, 65 cm dual coil active  fixation defibrillator lead, serial number [id number] and Medtronic MB2  coronary sinus cannulation catheter through which was ultimately passed  a 4194, 88-cm bipolar left ventricular lead, serial number [id number],  and a  Medtronic 5076, 52 cm active fixation atrial lead, serial number  [id number].  Under fluoroscopic guidance the initial lead was moved to  the right ventricular apex where the bipolar R-wave was 13.8 with a pace  impedance of 1138 ohms, a pacing threshold of 1.3 volts at 0.5 ms.  Current of threshold was 1.7 mA.  There was no diaphragmatic pacing at  10 volts and the current of injury was brisk.  This lead was secured to  the prepectoral fascia.   The coronary sinus was then cannulated with the aforementioned sheath  and a Wholey wire with a little difficulty.  A total of 4 mL of contrast  was utilized in a 2:1 mixture to first demonstrate patency of the  coronary sinus and then an occlusive venogram demonstrated a high  lateral branch which was then targeted with the aforementioned lead.  In  its bipolar configuration there was diaphragmatic stimulation.  In the  ring coil configuration there was none and in this configuration the  sensed L-wave was 23 with a pace impedance of 46 and a threshold of 1  volt at 0.5 ms and the current of threshold was 2.3 mA.  The 9.5-French  sheath was removed and the aforementioned atrial lead was manipulated to  the right atrial appendage where the bipolar flutter wave was 3 with a  pace impedance of 1126 ohms and the current of injury was also brisk.  These leads were secured to the prepectoral fascia.  With the  aforementioned acceptable parameters recorded, all the leads were  secured to the prepectoral fascia and the coronary sinus delivery system  was removed, the leads reinterrogated, parameters confirmed and leads  secured.  The leads were then attached to a Medtronic Concerto bi-V ICD  model number [id number]  , serial number [id number].  Through the device  the bipolar now sinus P-wave was 1.6 with a pace impedance of 800 ohms,  a threshold of 1 volt at 0.2, the R-wave was 16.5 with impedance of 664,  and a threshold of 1 volt at 0.2, and the LV  impedance was 920 with a  threshold 1.5 volts at 0.3.  Proximal coil impedance was 63, distal coil  impedance was 53.   DFT testing was undertaken.   Ventricular fibrillation was induced via the T-wave shock after a total  duration of 7 seconds, a 20 joule shock was delivered through a measured  resistance of 48 ohms terminating ventricular fibrillation and restoring  sinus rhythm.  At this point the device was implanted.  The pocket was  copiously irrigated with antibiotic containing saline solution.  Hemostasis was assured and the leads and the pulse generator were placed  in the pocket and secured to the prepectoral fascia.  A little bit of  Surgicel was used at the caudal aspect of the pocket.  The wound was  then closed in 3 layers in the normal fashion.  The wound was washed,  dried and a benzoin and Steri-Strip dressing was applied.  Needle  counts, sponge counts and instrument counts were correct at the end of  procedure according to the staff.  The patient tolerated the procedure  without apparent complication.      Duke Salvia, MD, [facility]  Electronically Signed     SCK/MEDQ  D:  02/14/2008  T:  02/15/2008  Job:  [id number]   cc:   Elmore Guise., M.D.  Electrophysiiology Laboratory  [facility]

## 2011-05-03 NOTE — Assessment & Plan Note (Signed)
[facility]                         ELECTROPHYSIOLOGY OFFICE NOTE   Kori, Colin GREYSYN VANDERBERG                      MRN:          [id number]  DATE:02/17/2009                            DOB:          1946/05/22    Mr. Geiger is seen in followup for an ICD implanted for congestive heart  failure.  He received a CRT device.  He has had no significant  improvement, although as he tells his story now he is clearly not  symptomatic as he previously was.   MEDICATIONS:  1. Carvedilol 6.25 b.i.d.  2. Furosemide.  3. Potassium.  4. Sertraline.  5. Protonix.  6. Simvastatin 40.  7. Warfarin.  8. Lisinopril 10  9. Amiodarone has been intercurrently discontinued.    He has had a little bit of a cough since the ACE inhibitor was resumed.   PHYSICAL EXAMINATION:  VITAL SIGNS:  His blood pressure was 120/60, his  pulse was 73, weight was 229 which represents a 14-pound weight loss  since May.  LUNGS:  Clear.  HEART:  Sounds were regular.  ABDOMEN:  Soft.  EXTREMITIES:  Without edema.   Interrogation of his Medtronic ICD demonstrates a 2.6 mV.  P-wave with  impedance of 744.  Threshold volt of 0.4.  The RV was 14.7 with  impedance of 440 with threshold volt of 0.6.  The LV impedance was 840  with threshold volt of 0.4.  Battery voltage was 3.16.  He was 99%  ventricularly paced and 4% atrial paced.  Review of his cardiac compass  demonstrates no episodes of atrial fibrillation, since the middle of  July and just 1 since the beginning of June.  Intercurrently, I should  note that his amiodarone has been discontinued.   IMPRESSION:  1. Ischemic and nonischemic cardiomyopathy.  2. Status post CRTD for the above.  3. Congestive heart failure - chronic - systolic - somewhat improved.  4. Atrial fibrillation, now quiescent.  5. Amiodarone therapy previously for #4 now discontinued.  6. Ongoing Coumadin therapy for the above.   Mr. Housey is doing quite well.  I  agree with Dr. Reyes Ivan that he should  be on Coumadin indefinitely.  He should be on Coumadin at this point  until we have better data to help [place] understand when Coumadin can be  discontinued in patients in whom  internal monitoring demonstrates no  atrial fibrillation.   We will see him again in 1 year's time.  He will follow up with Dr.  Reyes Ivan in the interim.     Duke Salvia, MD, [facility]  Electronically Signed    SCK/MedQ  DD: 02/17/2009  DT: 02/18/2009  Job #: [id number]   cc:   Elmore Guise., M.D.

## 2011-05-03 NOTE — H&P (Signed)
NAME:  AMIT, LEECE NO.:  [id number]   MEDICAL RECORD NO.:  [id number]           PATIENT TYPE:   LOCATION:                                 FACILITY:   PHYSICIAN:  Elmore Guise., M.D.DATE OF BIRTH:  12-13-1946   DATE OF ADMISSION:  02/04/2008  DATE OF DISCHARGE:                              HISTORY & PHYSICAL   REASON FOR ADMISSION:  Evaluate for balanced ischemia prior to  biventricular defibrillator placement.   HISTORY OF PRESENT ILLNESS:  [name] is a very pleasant, 65 year old,  white male with past medical history of ischemic cardiomyopathy (EF  approximately 20%), history of single vessel coronary artery disease  (status post emergent PCI in 2000), dyslipidemia, hypertension, chronic  renal insufficiency with baseline creatinine 1.8 to 1.2 who has had  increasing dyspnea.  We have been limited on pushing his  antihypertensives because of his blood pressure running a little on the  lower side.  He has been running anywhere from 90/50 to a low of 85/60.  He had a stress test done on January 03, 2008.  This was abnormal  showing a moderate to large, fixed, anteroseptal and periapical defect  more with moderate LV enlargement and severe LV dysfunction with an EF  of 23%.  He was then sent to [name] for evaluation of  resynchronization therapy.  He does have known large, left bundle type  of defect.  He also had brief episode of rapid atrial fibrillation  during his Cardiolite, but has had no recurrences with that.  He denies  any exertional chest pain, just dyspnea.  He can walk up to a block or  two at a time, but then will have to sit down and rest.  He has had no  fever or cough.  He has stable two-pillow orthopnea.   CURRENT MEDICATIONS:  1. Coumadin (currently on hold).  2. Coreg 6.25 mg twice daily.  3. Digoxin 0.125 mg daily.  4. Lasix 40 mg daily.  5. Metolazone 2.5 mg every other day.  6. Diovan 80 mg daily.  7. Inspra 25 mg  daily.  8. Zoloft 75 mg daily.  9. Potassium 10 mEq twice daily.  10.Lorazepam p.r.n.  11.Aspirin 81 mg daily.  12.Vitamin C 250 mg daily.  13.Multivitamin once daily.  14.Folic acid 1 mg daily.  15.Protonix 40 mg daily.  16.Tricor 145 mg daily.  17.Simvastatin 40 mg daily.   ALLERGIES:  No known drug allergies.   FAMILY HISTORY:  Positive for heart disease with his father who died at  age 48 from a heart attack.  His mother had breast cancer and  depression.   SOCIAL HISTORY:  He is married.  He smoked x34 pack years.  He quit 8  years ago.  No alcohol or caffeine.   PHYSICAL EXAMINATION:  VITAL SIGNS:  Weight 240 pounds, blood pressure  84/58, heart rate 56 and regular.  GENERAL:  He is a very pleasant, middle-age, white male, alert and  oriented x4 in no acute distress.  NECK:  No JVD.  LUNGS:  Clear with good  breath sounds to the bases.  HEART:  Regular with 2/6 holosystolic murmur noted.  ABDOMEN:  Soft, nontender, nondistended, no rebound or guarding.  EXTREMITIES:  Warm with 2+ pulses and trace pretibial edema.  His EKG  shows normal sinus rhythm with old anterolateral MI.  He has a  nonspecific intraventricular conduction delay with QRS of 127 msec.  His  blood work today shows a BUN and creatinine of 37 and 2.0 with potassium  level of 3.8.  He had a white blood cell count of 7.9, hemoglobin 13.5  and platelet count 346.  His PT/INR is 16.2 and 1.3.   IMPRESSION:  1. History of old anterior myocardial infarction, now referred for      cardiac catheterization to rule out balanced ischemia prior to      resynchronization therapy.  2. History of dilated cardiomyopathy with an ejection fraction of 20-      25%.  3. Hypertension.  4. Dyslipidemia.   PLAN:  The patient does have mild chronic renal insufficiency.  He will  be started on Mucomyst 600 mg twice daily.  We will hold his Lasix,  Diovan, Inspra as well as metolazone prior to the procedure.  His  Coumadin  has been on hold and his INR is safe to do his catheterization.  Will try to limit his contrast exposure.  I will hydrate him with bicarb  infusion once he arrives for his catheterization.  All his questions  were answered.      Elmore Guise., M.D.  Electronically Signed     TWK/MEDQ  D:  02/04/2008  T:  02/04/2008  Job:  [id number]

## 2011-05-03 NOTE — Letter (Signed)
Apr 22, 2008    Rosine Abe, M.D.  [address]  [place], [place] [address]   RE:  [name], [name]  MRN:  [id number]  /  DOB:  09-18-1946   Dear Aurther Loft:   [name] came in today in follow-up for his CRT device.  This  appointment was moved up for reasons that were not entirely clear, but  he is strikingly unimpressed with the benefits from his device.  At this  point I am not sure if he going to grow into it or not.  There has  been some diaphragmatic stimulation, which has been positional.   His medication list is lengthy and is unchanged.  He is on amiodarone at  200 mg a day for frequent atrial fibrillation episodes.  He did describe  an episode about 3 or 4 weeks ago where he was severely dyspneic with  exertion (see below).   His other medications, including the above, include:  1. Carvedilol at a relatively low dose of 6.25 mg b.i.d.  2. He is also on Lanoxin 0.125 mg.  3. Diovan 80 mg.  4. Furosemide 40 mg.  5. Sertraline 50 mg.  6. Simvastatin.  7. Warfarin.  8. Eplerenone.   EXAMINATION:  His blood pressure was 124/70 with a pulse of 55.  His lungs were clear.  His heart sounds were regular.  His neck veins were flat.  His abdomen was soft.  The extremities were without edema.  His right-sided device pocket was well-healed.   Interrogation of his Medtronic device demonstrated a Concerto with a P  wave of 2.6 with impedance of 704, threshold of 1 V at 0.4.  The R wave  was 32 with an impedance of 416 and a threshold of 1.5 at 0.4 or 1 V at  0.6.  LV impedance was 760 with a threshold of 1 V at 0.4.   The device was reprogrammed to maximize longevity.  There were no  intercurrent episodes.  There was episode of atrial fibrillation that  corresponded in rough time with the episode that he described above that  was both prolonged and associated with a rapid ventricular response.  There has been a significant decrease in the frequency of his atrial  fibrillation as well as the duration of the atrial fibrillation episodes  over the last couple of weeks.  This has been associated with increased  ventricular pacing but, unfortunately, not attended with an improvement  in his symptoms.   IMPRESSION:  1. Congestive heart failure. class III.  2. Ischemic and nonischemic cardiomyopathy.  3. Status post CRT-D for the above.  4. Paroxysmal atrial arrhythmias with a rapid ventricular response on      amiodarone, with a significant decrease in the frequency and      duration of episodes.   Chest x-ray today demonstrated good lead position.   DISCUSSION:  Aurther Loft, [name] is quite is quite clearly not responding  to CRT.  Based on the recent [facility] algorithm, we went ahead  and checked a chest x-ray today, which is okay, and we will submit him  for AV optimization echo in the next couple of weeks.   I trust that you are following his amiodarone surveillance laboratories.   If there is anything further I can do in the interim, please do not  hesitate to contact me.    Sincerely,      Duke Salvia, MD, [facility]  Electronically Signed    SCK/MedQ  DD: 04/22/2008  DT: 04/22/2008  Job #: [id number]

## 2011-05-03 NOTE — Discharge Summary (Signed)
NAMECRIAG, WICKLUND NO.:  [id number]   MEDICAL RECORD NO.:  [id number]          PATIENT TYPE:  OIB   LOCATION:  2004                         FACILITY:  [facility]   PHYSICIAN:  Rosine Abe, M.D.  DATE OF BIRTH:  1946-10-10   DATE OF ADMISSION:  02/14/2008  DATE OF DISCHARGE:  02/15/2008                               DISCHARGE SUMMARY   PRIMARY CARE PHYSICIAN:  Evelena Peat, M.D.   Dictation and examination and preparation are greater than 45 minutes.   ALLERGIES:  THIS PATIENT HAS NO KNOWN DRUG ALLERGIES.   FINAL DIAGNOSES:  1. Discharge day 1: Status post implant of Medtronic CONCERTO CRT-D      system.  2. Elevated creatinine to 1.94;  day of discharge the patient received      IV hydration and will have close followup Monday, March 2nd for a      complete metabolic panel to check creatinine also liver function      studies.  3. Paroxysmal atrial flutter with  a rather incessant on/off pattern      this admission.      a.     Possibly contributing to cardiomyopathy.      b.     Patient will start on Amiodarone therapy this admission.      c.     Possible implant in the future after complete healing of the       cardiac resynchronization system.   SECONDARY DIAGNOSES:  1. History of anterior wall myocardial infarction 2000.      a.     Emergent stent to the proximal LAD.  2. Ischemia of cardiomyopathy. Echocardiogram April 2008.  Ejection      fraction of 30-35%.      a.     Myoview study January 03, 2008. Ejection fraction 23% with a       finding of moderate to large fixed, anterior, anteroseptal,       periapical defects.  3. Hypertension.  4. Mild renal insufficiency.  5. Dyslipidemia.  6. New York Heart Association Class II chronic systolic congestive      heart failure.   PROCEDURE:  February 14, 2008: Implant of a Medtronic CONCERTO BiV  cardioverter-defibrillator, Dr. Duke Salvia.   BRIEF HISTORY:  Mr. Downs is a 65 year old male  with widow maker acute  myocardial infarction in 2000. He had a stent placed emergently to the  proximal LAD.   The patient has declining ejection fraction. The last Myoview on January 03, 2008 showed an ejection fraction of 23%. No ischemia, but there were  fixed defects as mentioned above. The patient has New York Heart  Association Class II-III chronic systolic congestive heart failure  symptoms. However, he has never been hospitalized for acute and chronic  decompensation of his congestive failure. The patient has a wide QRS  with left bundle branch block-like appearance.   The patient had an office visit with [name].  [name] suggested  implantation of a BiV ICD. The patient had catheterization on February 05, 2008. The left main was normal.  The LAD had a proximal stent with a  20-30% in-stent restenosis. The LAD had mid to distal irregularities.  The left circumflex had mild luminal irregularities. The right coronary  artery had mild to moderate irregularities. It was the dominant artery.  Ejection fraction 25-30%. The patient has no history of syncope. The  patient does note  recurrent and recent tachy palpitations. He is on  Coumadin for thrombosis prophylaxis due to a weak left ventricular, but  also secondary to atrial arrhythmias, most likely atrial flutter.  The  patient has a history of dyslipidemia and a strong family history of  premature coronary artery disease and occasional lower extremity edema.  He denies diabetes, stroke, or GI bleed. He has mild renal  insufficiency. His baseline is 1.8 to 2.0 creatinine. INR today on  admission was 1.4. He is on Coumadin 2.5/5.0 alternating. The plan was  to admit the patient to implant BiV ICD on the right side.   HOSPITAL COURSE:  The patient presented electively February 26th. He  underwent implantation of a Medtronic CONCERTO CRT-D system by Dr.  Sherryl Manges. He had mild incisional pain and soreness which was well   controlled with Percocet, but the patient says that he will be fine on  Tylenol going home. He did have incessant paroxysms of atrial flutter,  rates to 140, 150 throughout his postprocedure course. [name] and Dr.  Reyes Ivan have talked well.  The patient will be started on Amiodarone in  addition to his other medical management. I would imagine that after the  device is healed in well, the patient could be a candidate for atrial  flutter ablation. This will be discussed with the patient and Dr. Reyes Ivan  in followup appointments:  1. At [facility] blood work will be done Monday, March 24th      anytime after 8:30 and then he will see Dr. Reyes Ivan on Thursday,      March 5th at 1:45 p.m. . He is asked to keep his right arm quiet      for the next 4 days especially and to keep his incision dry for the      next 7 days, to sponge bathe until Thursday, March 5th.  2. He follows up at [facility] [address]  3. For the ICD Clinic to check the incision Wednesday, March 11th at 9      o'clock and he will see [name] in June. His office will call      with that appointment. Once again, the question of atrial flutter      ablation is posed and is a possibility for the future.   MEDICATIONS AT DISCHARGE:  1. Coreg 6.25 mg twice daily.  2. Digoxin 0.125 mg daily.  3. Diovan 80 mg daily.  4. Lasix 40 mg daily.  5. Metolazone 2.5 mg every other day.  6. Potassium chloride 20 mEq daily.  7. Protonix 40 mg daily.  8. Sertraline 50 mg in the morning.  9. Tricor 145 mg at bedtime.  10.Simvastatin 40 mg at bedtime.  11.Enteric coated aspirin 81 mg daily.  12.Folic acid 800 mg daily.  13.Vitamin C, 250 mg daily.  14.Inspra, the patient is to take it as he has prior to this      admission.  15.Coumadin 5 mg alternating with 2.5 mg daily and the new medication      which is Amiodarone, he is to add taking this. These are 200 mg  tablets, 2 tabs 3 times daily,  basically at 7 o'clock in the      morning, at 3 o'clock in the afternoon, and 10 o'clock in the      evening. He is to take them from February 27th to March 8th. Then      starting March 9th, he is to take 200 mg 2 tabs daily, 400 mg      daily.   LABORATORY STUDIES THIS ADMISSION:  Serum electrolytes: Sodium is 141,  potassium 3.5, chloride 98, carbonate 32, BUN 36, creatinine 1.94,  glucose 109. Complete blood count: White cells are 9.2, hemoglobin 13,  hematocrit 39.2, and platelets 291. Pro time is 19. INR 1.6.      Maple Mirza, PA      ______________________________  Rosine Abe, M.D.    GM/MEDQ  D:  02/15/2008  T:  02/16/2008  Job:  [id number]   cc:   Duke Salvia, MD, [facility]  Evelena Peat, M.D.

## 2011-05-03 NOTE — Cardiovascular Report (Signed)
NAME:  [name], [name] NO.:  [id number]   MEDICAL RECORD NO.:  [id number]          PATIENT TYPE:  OIB   LOCATION:  1963                         FACILITY:  [facility]   PHYSICIAN:  Elmore Guise., M.D.DATE OF BIRTH:  October 30, 1946   DATE OF PROCEDURE:  02/05/2008  DATE OF DISCHARGE:                            CARDIAC CATHETERIZATION   INDICATIONS FOR PROCEDURE:  Evaluate for balance ischemia prior to  possible bi-V ICD implant.   HISTORY OF PRESENT ILLNESS:  [name] is a very pleasant 65 year old  white male with past medical history of ischemic cardiomyopathy (EF  approximately 20%), history of single-vessel coronary artery disease  status post emergent PCI of his proximal LAD in 2000), dyslipidemia,  hypertension, chronic renal insufficiency, who now is referred for a  cardiac catheterization.   DESCRIPTION OF PROCEDURE:  The patient was brought to the cardiac  catheterization lab.  After appropriate informed consent, he was prepped  and draped in sterile fashion.  Approximately 10 mL of 1% lidocaine was  used for local anesthesia.  A 5-French sheath was placed in the right  femoral artery without difficulty.  Coronary angiography and LV  angiography were performed.  The patient tolerated the procedure well  with no apparent complications.  The patient was noted to have  paroxysmal rapid atrial flutter and given metoprolol 2.5 mg x3 with rate  control and conversion to normal sinus rhythm.   FINDINGS:  1. Left Main:  Normal-appearing.  2. LAD:  Patent proximal stent with mild 20-30% in-stent restenosis,      with mid and distal luminal irregularities.  3. LCX:  Nondominant with mild luminal irregularities.  4. OM-1/OM-2:  Moderate-sized vessels with mild to moderate luminal      irregularities.  5. RCA:  Dominant with mild to moderate luminal irregularities.  6. LV:  EF is 25-30%.  LVEDP is 25 mmHg.   IMPRESSION:  1. Nonobstructive coronary arteries with  patent proximal left anterior      descending artery stent with mild 20-30% in-stent restenosis noted.  2. Moderate to severe left ventricular dysfunction with an ejection      fraction of 25-30%.  3. Patent left circumflex, right coronary artery with no significant      obstructive disease noted.   PLAN:  At this time we will continue maximal medical treatment and the  patient will be referred for a bi-V ICD implant.  He does have mild  chronic renal insufficiency with baseline creatinine 1.8 to 2..0.  He  was given hydration with bicarb drip, Mucomyst.  We will hold his Lasix,  Inspra, metolazone and Diovan today.  He can resume his Coumadin  tomorrow.  He is on Coumadin because of his intermittent atrial  arrhythmias.      Elmore Guise., M.D.  Electronically Signed     TWK/MEDQ  D:  02/05/2008  T:  02/06/2008  Job:  [id number]   cc:   Duke Salvia, MD, [facility]

## 2011-05-06 NOTE — Op Note (Signed)
[name] [name]               ACCOUNT NO.:  [id number]   MEDICAL RECORD NO.:  [id number]          PATIENT TYPE:  AMB   LOCATION:  ENDO                         FACILITY:  [facility]   PHYSICIAN:  [name] [name], MDDATE OF BIRTH:  [birth year]   DATE OF PROCEDURE:  04/05/2006  DATE OF DISCHARGE:                                 OPERATIVE REPORT   PROCEDURE:  Upper endoscopy.   INDICATIONS:  Barrett's esophagus.   MEDICATIONS:  Fentanyl 75 mcg IV, Versed 7 mg IV .   FINDINGS:  The endoscope was inserted through the oropharynx and the  esophagus was intubated.  The esophagus was normal in its entirety.  The GE  junction was noted to be approximately 42 cm.  No salmon-colored mucosa and  no Barrett-s appearing esophageal mucosa was seen.  The endoscope was  advanced down into the stomach, which revealed a normal body, antrum, cardia  and fundus.  The endoscope was straightened and there were mild erythematous  linear streaks in the antrum consistent with mild gastritis.  No ulcer was  seen.  The endoscope was advanced down to the duodenal bulb and the second  portion of the duodenum, which were both normal.  The endoscope was then  withdrawn and the esophagus was then reevaluated and again no Barrett's  esophagus was seen.  The endoscope was withdrawn, confirming the above  findings.   ASSESSMENT:  1.  Mild antral gastritis.  2.  No Barrett's esophagus seen endoscopically.   PLAN:  1.  Repeat upper endoscopy in three years due to history of Barrett's      esophagus.  2.  Proceed with colonoscopy.      [name] Friar, MD  Electronically Signed     VCS/MEDQ  D:  04/05/2006  T:  04/06/2006  Job:  [id number]   cc:   [name] [name]., M.D.  Fax: [phone]

## 2011-05-06 NOTE — Op Note (Signed)
[name], [name]               ACCOUNT NO.:  [id number]   MEDICAL RECORD NO.:  [id number]          PATIENT TYPE:  AMB   LOCATION:  ENDO                         FACILITY:  [facility]   PHYSICIAN:  Shirley Friar, MDDATE OF BIRTH:  Apr 22, 1946   DATE OF PROCEDURE:  04/05/2006  DATE OF DISCHARGE:                                 OPERATIVE REPORT   INDICATIONS:  History of colon polyps.   MEDICATIONS:  Fentanyl 25 mcg IV, Versed 4 mg IV.  See EGD report for  remaining sedation.   FINDINGS:  An adult adjustable colonoscope was inserted into an adequately  prepped colon and advanced to the cecum, where the ileocecal valve and  appendiceal orifice were identified.  The terminal ileum was intubated and  was normal in appearance.  Careful withdrawal of the colonoscope revealed  scattered sigmoid shallow diverticulosis.  A 2 mm sessile polyp was noted in  the sigmoid colon, which was removed with cold biopsy with minimal bleeding.  No other polyps and no other mucosal abnormalities were seen.  Retroflexion  was normal in appearance.   ASSESSMENT:  1.  Scattered diverticulosis.  2.  Colon polyp (2 mm), removed with cold biopsy, otherwise normal      colonoscopy.   PLAN:  1.  High fiber diet.  2.  Follow up on pathology.  3.  Hold Coumadin for the next 10 days.  4.  Hold aspirin products for the next 14 days.  5.  Repeat colonoscopy in 3-5 years.      Shirley Friar, MD  Electronically Signed     VCS/MEDQ  D:  04/05/2006  T:  04/06/2006  Job:  [id number]   cc:   Elmore Guise., M.D.  Fax: [phone]   Evelena Peat, M.D.  Fax: [phone]

## 2011-05-26 ENCOUNTER — Other Ambulatory Visit: Payer: Self-pay | Admitting: Cardiology

## 2011-05-26 MED ORDER — AMIODARONE HCL 200 MG PO TABS: 200.0000 mg | ORAL_TABLET | Freq: Every day | ORAL | Status: DC

## 2011-05-26 NOTE — Telephone Encounter (Signed)
Called requesting refill on Amiodarone be sent to [facility]. Done.

## 2011-05-26 NOTE — Telephone Encounter (Signed)
Called in wanting a refill of Amiodarone 200mg  90 day at [facility]. And he sends his warmest reguards.

## 2011-06-02 ENCOUNTER — Encounter: Payer: Self-pay | Admitting: Cardiology

## 2011-06-08 ENCOUNTER — Encounter: Payer: Self-pay | Admitting: Cardiology

## 2011-06-08 ENCOUNTER — Ambulatory Visit (INDEPENDENT_AMBULATORY_CARE_PROVIDER_SITE_OTHER): Payer: 59 | Admitting: Cardiology

## 2011-06-08 VITALS — BP 130/78 | HR 60 | Wt 264.0 lb

## 2011-06-08 DIAGNOSIS — I509 Heart failure, unspecified: Secondary | ICD-10-CM

## 2011-06-08 DIAGNOSIS — E785 Hyperlipidemia, unspecified: Secondary | ICD-10-CM

## 2011-06-08 DIAGNOSIS — I4891 Unspecified atrial fibrillation: Secondary | ICD-10-CM

## 2011-06-08 DIAGNOSIS — I2510 Atherosclerotic heart disease of native coronary artery without angina pectoris: Secondary | ICD-10-CM

## 2011-06-08 DIAGNOSIS — Z91199 Patient's noncompliance with other medical treatment and regimen due to unspecified reason: Secondary | ICD-10-CM

## 2011-06-08 DIAGNOSIS — E7800 Pure hypercholesterolemia, unspecified: Secondary | ICD-10-CM

## 2011-06-08 DIAGNOSIS — Z9119 Patient's noncompliance with other medical treatment and regimen: Secondary | ICD-10-CM

## 2011-06-08 MED ORDER — FUROSEMIDE 40 MG PO TABS: 40.0000 mg | ORAL_TABLET | Freq: Two times a day (BID) | ORAL | Status: DC

## 2011-06-08 MED ORDER — CARVEDILOL 6.25 MG PO TABS: ORAL_TABLET | ORAL | Status: DC

## 2011-06-08 NOTE — Patient Instructions (Signed)
I recommend you continue on your current medications.  Sodium restriction.  You need fasting lab work to monitor your medical care.

## 2011-06-08 NOTE — Assessment & Plan Note (Signed)
He has chronic congestive heart failure with class 2-3 symptoms. He does appear to be volume overloaded today. He has been noncompliant with his dietary therapy. He is on appropriate therapy with carvedilol, ACE inhibitors, digoxin, diuretics, and Inspra. I question his medical compliance. He is status post biventricular ICD. I stressed the importance of dietary restriction particularly of sodium. He needs to lose weight. We will continue with his current medical therapy pending the results of his lab work.

## 2011-06-08 NOTE — Assessment & Plan Note (Signed)
His atrial fibrillation appears to be well-controlled on amiodarone therapy. He needs followup surveillance blood work including livers studies, electrolytes, dig level, and TSH. We will schedule him for fasting lab work.

## 2011-06-08 NOTE — Progress Notes (Signed)
[name] Date of Birth: [birth year]   History of Present Illness: [name] is seen for followup today. His last visit was in July of 2011. He has failed his followup visits. Since his last visit he has gained 22 pounds. He admits that he been noncompliant with his diet and is drinking a lot of sodas. He is not exercising. He has chronic shortness of breath with activity which has been stable. He has had some increased lower extremity edema. He denies any significant orthopnea or cough. He denies any chest pain. He's had no defibrillator discharges or palpitations. His last defibrillator check with [name] showed rare episodes of atrial fibrillation. He remains on amiodarone.  [name] is upset today. He complains about the high cost of his medical care and the fact that he doesn't feel any better despite advanced cardiac therapy. He reports that he previously lost down to 180 pounds and is exercising regularly but that he felt no better and so he didn't see the point in continuing. He is frustrated that he doesn't feel any better and questioned why he should continue to take medications and have regular medical followup when he doesn't feel any different. I note  on his evaluation with [name] he complained of similar issues.  Current Outpatient Prescriptions on File Prior to Visit  Medication Sig Dispense Refill  . amiodarone (PACERONE) 200 MG tablet Take 1 tablet (200 mg total) by mouth daily.  90 tablet  3  . Ascorbic Acid (VITAMIN C) 250 MG tablet Take 250 mg by mouth daily.        . digoxin (LANOXIN) 0.125 MG tablet Take 125 mcg by mouth daily.       Marland Kitchen eplerenone (INSPRA) 25 MG tablet Take 25 mg by mouth daily.        . fenofibrate 160 MG tablet Take 160 mg by mouth daily.        . folic acid (FOLVITE) 800 MCG tablet Take 400 mcg by mouth daily.        Marland Kitchen lisinopril (PRINIVIL,ZESTRIL) 10 MG tablet Take 10 mg by mouth daily.        Marland Kitchen LORazepam (ATIVAN) 1 MG tablet Take 1 mg by  mouth every 8 (eight) hours.        . Multiple Vitamins-Minerals (MULTIVITAMIN WITH MINERALS) tablet Take 1 tablet by mouth daily.        . nitroGLYCERIN (NITROSTAT) 0.4 MG SL tablet Place 0.4 mg under the tongue every 5 (five) minutes as needed.        Marland Kitchen omeprazole (PRILOSEC) 40 MG capsule Take 40 mg by mouth daily.        . potassium chloride (KLOR-CON) 10 MEQ CR tablet Take 10 mEq by mouth daily.        . pravastatin (PRAVACHOL) 40 MG tablet Take 40 mg by mouth daily.        . sertraline (ZOLOFT) 100 MG tablet Take 100 mg by mouth daily.        Marland Kitchen warfarin (COUMADIN) 5 MG tablet Take 5 mg by mouth daily. UAD       . DISCONTD: carvedilol (COREG) 6.25 MG tablet Take 6.25 mg by mouth. 1/2 TAB  BID       . DISCONTD: furosemide (LASIX) 40 MG tablet Take 40 mg by mouth 2 (two) times daily. 1/2 tablet         . DISCONTD: aspirin 81 MG tablet Take 81 mg by mouth daily.  No Known Allergies  Past Medical History  Diagnosis Date  . Coronary artery disease   . MI (myocardial infarction)     ANTERIOR  . Ischemic cardiomyopathy     EF 23%  . PAF (paroxysmal atrial fibrillation)   . Chronic renal insufficiency   . Dyslipidemia   . Depression   . ED (erectile dysfunction)     Past Surgical History  Procedure Date  . Cardiac catheterization 02/05/2008    MODERATE TO SEVERE LEFT VENTRICULAR  DYSFUNCTION WITH EF 25-30%  . Coronary stent placement     LAD  . Transthoracic echocardiogram 12/2010    EF 30-35%  . Cardiac defibrillator placement     BI-VENTRICULAR ICD  . Wrist fracture surgery   . Breast lumpectomy     History  Smoking status  . Former Smoker  . Types: Cigarettes  . Quit date: 12/23/1998  Smokeless tobacco  . Not on file    History  Alcohol Use: Not on file    Family History  Problem Relation Age of Onset  . Breast cancer Mother   . Depression Mother   . Heart attack Father   . Breast cancer Sister     Review of Systems: The review of systems is  positive for chronic dyspnea on exertion.  He has chronic fatigue. He has insomnia. He has lower extremity edema. He has a 22 pound weight gain.All other systems were reviewed and are negative.  Physical Exam: BP 130/78  Pulse 60  Wt 264 lb (119.75 kg) He is an obese white male in no acute distress. His HEENT exam is unremarkable. He is normocephalic, atraumatic. Pupils are round and reactive. Sclera clear. Oropharynx is clear. Neck is supple without JVD, adenopathy, or bruits. Lungs are clear. Cardiac exam reveals a regular rate and rhythm with a grade 2/6 systolic murmur at apex. There is no S3. Abdomen is obese, soft, nontender. He has no masses or bruits. He has 2+ lower extremity edema. Skin is warm and dry. He is alert and oriented x3. Cranial nerves II through XII are intact. LABORATORY DATA:   Assessment / Plan:

## 2011-06-08 NOTE — Assessment & Plan Note (Signed)
He has had no followup of his lipid status in the past year. We will schedule him for fasting lab work.

## 2011-06-08 NOTE — Assessment & Plan Note (Signed)
I spent over 30 minutes with the patient today discussing the goals of his medical therapy. I understand his frustration in not feeling better but I stressed the importance of other goals including reduction in mortality and prevention of progression of his disease. He has very little understanding of this concept. I think there is a good chance with his noncompliance that he will negate the positive effects of his aggressive medical therapy. I think it is clear that he can certainly be a lot worse and without appropriate medical therapy I think he will be. I stressed the importance of regular laboratory data to followup on his medications which are complex and high risk. This includes regular monitoring of his Coumadin. It also includes regular monitoring of his chemistries and thyroid studies on amiodarone. I recommended followup with me in 6 months. He is to continue followup with [name] in our pacemaker clinic. [name] in mention a sleep study to evaluate him for sleep apnea to see if this would offer an avenue for improvement in his symptoms. He is not interested in our ICD clinic. I told him that if he was not happy with the care we were providing that he had the option of obtaining a second opinion or receiving care elsewhere.

## 2011-06-08 NOTE — Assessment & Plan Note (Addendum)
The patient has had optimal revascularization. He is having no current anginal symptoms. We'll continue with his medical therapy.

## 2011-06-09 ENCOUNTER — Other Ambulatory Visit: Payer: Self-pay | Admitting: *Deleted

## 2011-06-09 NOTE — Telephone Encounter (Signed)
Received fax from [facility] to clarify Furosemide dose. Pt is taking 40 mg 1/2 tablet twice a day; faxed back to [facility].

## 2011-06-24 ENCOUNTER — Encounter: Payer: Self-pay | Admitting: Internal Medicine

## 2011-07-06 ENCOUNTER — Ambulatory Visit (INDEPENDENT_AMBULATORY_CARE_PROVIDER_SITE_OTHER): Payer: 59 | Admitting: *Deleted

## 2011-07-06 DIAGNOSIS — I428 Other cardiomyopathies: Secondary | ICD-10-CM

## 2011-07-06 NOTE — Progress Notes (Signed)
ICD checked in device clinic. 

## 2011-07-12 ENCOUNTER — Other Ambulatory Visit: Payer: Self-pay | Admitting: Cardiology

## 2011-07-12 ENCOUNTER — Other Ambulatory Visit (INDEPENDENT_AMBULATORY_CARE_PROVIDER_SITE_OTHER): Payer: 59 | Admitting: *Deleted

## 2011-07-12 DIAGNOSIS — I2510 Atherosclerotic heart disease of native coronary artery without angina pectoris: Secondary | ICD-10-CM

## 2011-07-12 DIAGNOSIS — E7800 Pure hypercholesterolemia, unspecified: Secondary | ICD-10-CM

## 2011-07-12 LAB — LIPID PANEL
Cholesterol: 238 mg/dL — ABNORMAL HIGH (ref 0–200)
VLDL: 40.2 mg/dL — ABNORMAL HIGH (ref 0.0–40.0)

## 2011-07-12 LAB — HEPATIC FUNCTION PANEL
AST: 30 U/L (ref 0–37)
Albumin: 4.2 g/dL (ref 3.5–5.2)
Alkaline Phosphatase: 34 U/L — ABNORMAL LOW (ref 39–117)
Total Protein: 7.6 g/dL (ref 6.0–8.3)

## 2011-07-12 LAB — BASIC METABOLIC PANEL
BUN: 26 mg/dL — ABNORMAL HIGH (ref 6–23)
GFR: 40.37 mL/min — ABNORMAL LOW (ref >60.00)
Potassium: 4.5 mEq/L (ref 3.5–5.1)

## 2011-07-14 ENCOUNTER — Telehealth: Payer: Self-pay | Admitting: *Deleted

## 2011-07-14 NOTE — Telephone Encounter (Signed)
Notified of lab results. Will send copy to Dr. Burnett 

## 2011-07-14 NOTE — Telephone Encounter (Signed)
Message copied by Lorayne Bender on Thu Jul 14, 2011  3:58 PM ------      Message from: [place], PETER M      Created: Wed Jul 13, 2011  9:47 AM       Dig level is good.

## 2011-07-14 NOTE — Telephone Encounter (Signed)
Message copied by Lorayne Bender on Thu Jul 14, 2011  4:05 PM ------      Message from: [place], PETER M      Created: Wed Jul 13, 2011  9:48 AM       Maybe Mr. Pullara is still being seen at [facility]. May need to send results there.            ----- Message -----         From: Kristian Covey         Sent: 07/12/2011   9:44 PM           To: Peter [place], MD            Theron Arista,            This patient's labs were forwarded to me.  I have not seen him since leaving [place].            Bruce      ----- Message -----         From: Peter [place], MD         Sent: 07/12/2011   8:50 PM           To: Lorayne Bender, RN, Elberta Fortis Burchette            Chemistries are significant for elevation of his creatnine. I have no baseline for comparison.      Tsh and liver function look good. Lipids are elevated consistent with his lack of compliance with his dietary recommendations and significant weight gain. I would continue his prescribed meds and encourage a heart healthy diet and weight loss.

## 2011-08-19 ENCOUNTER — Other Ambulatory Visit: Payer: Self-pay | Admitting: Cardiology

## 2011-08-19 NOTE — Telephone Encounter (Signed)
escribe medication per fax request  

## 2011-09-09 LAB — CBC
HCT: 39.2
Hemoglobin: 13
RBC: 4.99
WBC: 9.2

## 2011-09-09 LAB — PROTIME-INR
INR: 1.4
Prothrombin Time: 19 — ABNORMAL HIGH

## 2011-09-09 LAB — BASIC METABOLIC PANEL
BUN: 36 — ABNORMAL HIGH
CO2: 32
Chloride: 98
Creatinine, Ser: 1.94 — ABNORMAL HIGH

## 2011-09-09 LAB — APTT: aPTT: 27

## 2011-09-29 ENCOUNTER — Other Ambulatory Visit: Payer: Self-pay | Admitting: Cardiology

## 2011-09-30 ENCOUNTER — Other Ambulatory Visit: Payer: Self-pay

## 2011-09-30 ENCOUNTER — Other Ambulatory Visit: Payer: Self-pay | Admitting: Cardiology

## 2011-09-30 MED ORDER — LISINOPRIL 10 MG PO TABS: 10.0000 mg | ORAL_TABLET | Freq: Every day | ORAL | Status: DC

## 2011-10-03 ENCOUNTER — Other Ambulatory Visit: Payer: Self-pay | Admitting: Cardiology

## 2011-10-06 ENCOUNTER — Ambulatory Visit (INDEPENDENT_AMBULATORY_CARE_PROVIDER_SITE_OTHER): Payer: 59 | Admitting: *Deleted

## 2011-10-06 ENCOUNTER — Encounter: Payer: Self-pay | Admitting: Internal Medicine

## 2011-10-06 DIAGNOSIS — I428 Other cardiomyopathies: Secondary | ICD-10-CM

## 2011-10-06 DIAGNOSIS — I509 Heart failure, unspecified: Secondary | ICD-10-CM

## 2011-10-06 LAB — ICD DEVICE OBSERVATION
AL AMPLITUDE: 7.9976 mv
AL IMPEDENCE ICD: 752 Ohm
ATRIAL PACING ICD: 4.58 pct
BAMS-0001: 170 {beats}/min
BATTERY VOLTAGE: 3.02 V
CHARGE TIME: 10.94 s
FVT: 0
LV LEAD IMPEDENCE ICD: 752 Ohm
RV LEAD THRESHOLD: 0.5 V
TZAT-0001ATACH: 2
TZAT-0002ATACH: NEGATIVE
TZAT-0004SLOWVT: 8
TZAT-0004SLOWVT: 8
TZAT-0005SLOWVT: 88 pct
TZAT-0011SLOWVT: 10 ms
TZAT-0011SLOWVT: 10 ms
TZAT-0012ATACH: 150 ms
TZAT-0012ATACH: 150 ms
TZAT-0012FASTVT: 200 ms
TZAT-0012SLOWVT: 200 ms
TZAT-0012SLOWVT: 200 ms
TZAT-0018ATACH: NEGATIVE
TZAT-0018FASTVT: NEGATIVE
TZAT-0019ATACH: 6 V
TZAT-0020ATACH: 1.5 ms
TZAT-0020ATACH: 1.5 ms
TZAT-0020ATACH: 1.5 ms
TZAT-0020FASTVT: 1.5 ms
TZAT-0020SLOWVT: 1.5 ms
TZON-0003ATACH: 350 ms
TZON-0003SLOWVT: 340 ms
TZON-0003VSLOWVT: 400 ms
TZST-0001ATACH: 4
TZST-0001FASTVT: 2
TZST-0001FASTVT: 6
TZST-0001SLOWVT: 4
TZST-0001SLOWVT: 5
TZST-0002FASTVT: NEGATIVE
TZST-0002FASTVT: NEGATIVE
TZST-0002FASTVT: NEGATIVE
TZST-0003SLOWVT: 20 J
TZST-0003SLOWVT: 35 J
TZST-0003SLOWVT: 35 J
VF: 0

## 2011-10-06 NOTE — Progress Notes (Signed)
ICD with ICM

## 2011-10-20 ENCOUNTER — Other Ambulatory Visit: Payer: Self-pay | Admitting: Cardiology

## 2011-10-25 ENCOUNTER — Telehealth: Payer: Self-pay | Admitting: Internal Medicine

## 2011-10-25 NOTE — Telephone Encounter (Signed)
Called pt to make an appt for fu of a-fib, pt having a lot of questions and requests call from paula

## 2011-10-28 NOTE — Telephone Encounter (Signed)
Pt calling to speak with Gunnar Fusi. Please call back.

## 2011-10-28 NOTE — Telephone Encounter (Signed)
Patient was questioning why he needed to come in for an office visit.  I explained there was and increase in the % of time he was in A-fib and [name] wanted to see him.  Patient is in agreement.  Visit scheduled for 11/27 @ 12:15pm.

## 2011-11-15 ENCOUNTER — Encounter: Payer: 59 | Admitting: Internal Medicine

## 2011-11-22 ENCOUNTER — Other Ambulatory Visit: Payer: Self-pay | Admitting: Cardiology

## 2011-12-09 ENCOUNTER — Encounter: Payer: Self-pay | Admitting: Internal Medicine

## 2011-12-09 ENCOUNTER — Telehealth: Payer: Self-pay | Admitting: Internal Medicine

## 2011-12-09 ENCOUNTER — Ambulatory Visit (INDEPENDENT_AMBULATORY_CARE_PROVIDER_SITE_OTHER): Payer: 59 | Admitting: Internal Medicine

## 2011-12-09 ENCOUNTER — Other Ambulatory Visit: Payer: Self-pay | Admitting: Internal Medicine

## 2011-12-09 VITALS — BP 118/68 | HR 60 | Ht 70.0 in | Wt 268.0 lb

## 2011-12-09 DIAGNOSIS — Z95810 Presence of automatic (implantable) cardiac defibrillator: Secondary | ICD-10-CM

## 2011-12-09 DIAGNOSIS — I428 Other cardiomyopathies: Secondary | ICD-10-CM | POA: Insufficient documentation

## 2011-12-09 DIAGNOSIS — Z9119 Patient's noncompliance with other medical treatment and regimen: Secondary | ICD-10-CM

## 2011-12-09 DIAGNOSIS — Z91199 Patient's noncompliance with other medical treatment and regimen due to unspecified reason: Secondary | ICD-10-CM

## 2011-12-09 DIAGNOSIS — I4891 Unspecified atrial fibrillation: Secondary | ICD-10-CM

## 2011-12-09 DIAGNOSIS — E785 Hyperlipidemia, unspecified: Secondary | ICD-10-CM

## 2011-12-09 DIAGNOSIS — I2589 Other forms of chronic ischemic heart disease: Secondary | ICD-10-CM

## 2011-12-09 DIAGNOSIS — I509 Heart failure, unspecified: Secondary | ICD-10-CM

## 2011-12-09 DIAGNOSIS — G4730 Sleep apnea, unspecified: Secondary | ICD-10-CM

## 2011-12-09 LAB — ICD DEVICE OBSERVATION
AL THRESHOLD: 0.5 V
BAMS-0001: 170 {beats}/min
BATTERY VOLTAGE: 3 V
LV LEAD THRESHOLD: 1 V
PACEART VT: 0
RV LEAD AMPLITUDE: 12.7082 mv
TOT-0002: 0
TZAT-0001ATACH: 2
TZAT-0001ATACH: 3
TZAT-0001FASTVT: 1
TZAT-0001SLOWVT: 1
TZAT-0001SLOWVT: 2
TZAT-0002ATACH: NEGATIVE
TZAT-0002FASTVT: NEGATIVE
TZAT-0004SLOWVT: 8
TZAT-0004SLOWVT: 8
TZAT-0005SLOWVT: 88 pct
TZAT-0005SLOWVT: 91 pct
TZAT-0012ATACH: 150 ms
TZAT-0012ATACH: 150 ms
TZAT-0012ATACH: 150 ms
TZAT-0012FASTVT: 200 ms
TZAT-0013SLOWVT: 2
TZAT-0018ATACH: NEGATIVE
TZAT-0018ATACH: NEGATIVE
TZAT-0018FASTVT: NEGATIVE
TZAT-0019FASTVT: 8 V
TZAT-0020ATACH: 1.5 ms
TZAT-0020SLOWVT: 1.5 ms
TZAT-0020SLOWVT: 1.5 ms
TZON-0003ATACH: 350 ms
TZON-0003VSLOWVT: 400 ms
TZON-0004VSLOWVT: 20
TZST-0001ATACH: 6
TZST-0001FASTVT: 4
TZST-0001FASTVT: 6
TZST-0001SLOWVT: 3
TZST-0001SLOWVT: 5
TZST-0002ATACH: NEGATIVE
TZST-0002FASTVT: NEGATIVE
TZST-0002FASTVT: NEGATIVE
TZST-0002FASTVT: NEGATIVE
TZST-0002FASTVT: NEGATIVE
TZST-0003SLOWVT: 20 J
TZST-0003SLOWVT: 25 J
TZST-0003SLOWVT: 35 J
VENTRICULAR PACING ICD: 92.11 pct
VF: 0

## 2011-12-09 MED ORDER — CARVEDILOL 12.5 MG PO TABS: 12.5000 mg | ORAL_TABLET | Freq: Two times a day (BID) | ORAL | Status: DC

## 2011-12-09 NOTE — Assessment & Plan Note (Signed)
Patient has an increasing burden of atrial fibrillation up to about 16 or 17%. As noted there is no clear correlation between his symptoms of fatigue which are somewhat variable and atrial fibrillation. He is unaware of his atrial fibrillation. Because of that we have elected to stop the amiodarone and the digoxin and see how he does over the next 3-6 months.

## 2011-12-09 NOTE — Assessment & Plan Note (Signed)
I am not sure why he is not on a statin but this needs to be reviewed at his next visit

## 2011-12-09 NOTE — Patient Instructions (Addendum)
Your physician recommends that you schedule a follow-up appointment in: 3 months with the device clinic and 6 months with Dr Graciela Husbands  Your physician has recommended you make the following change in your medication:  1) STOP Amiodarone 2) STOP Digoxin 3) Increase Carvedilol to 6.25mg  twice daily

## 2011-12-09 NOTE — Assessment & Plan Note (Signed)
The patient's device was interrogated.  The information was reviewed. No changes were made in the programming.    

## 2011-12-09 NOTE — Assessment & Plan Note (Signed)
Stable but not  very functional

## 2011-12-09 NOTE — Telephone Encounter (Signed)
ordered

## 2011-12-09 NOTE — Assessment & Plan Note (Signed)
I again encouraged him to get a sleep study

## 2011-12-09 NOTE — Progress Notes (Signed)
HPI  [name] is a 65 y.o. male seen in followup for congestive heart failure in the setting of a mixed cardiomyopathy. He is status post CRT-D for the above.  He also has atrial fibrillation previously and now again on amiodarone.  surveillance laboratories have not been drawn recently.    He comes in today extremely frustrated with care, cost of medications, and the lack of improvement in his symptoms despite multiple interventions.  The patient denies chest pain, shortness of breath, nocturnal dyspnea, orthopnea or peripheral edema.  There have been no palpitations, lightheadedness or syncope.   He has significant snoring. He has daytime somnolence and chronic fatigue. He has not been inclined to have a sleep study.  At his most recent visit with [name] there is a recommendation that with his frustration perhaps he would be best served by getting another opinion.  The patient has variable exercise tolerance. He had an increasing burden of atrial fibrillation. He is unaware of his atrial fibrillation. He is not all clear whether There is a relationship between exercise tolerance and the presence of atrial fibrillation   Current Outpatient Prescriptions  Medication Sig Dispense Refill  . amiodarone (PACERONE) 200 MG tablet Take 200 mg by mouth daily.        . Ascorbic Acid (VITAMIN C) 250 MG tablet Take 250 mg by mouth daily.        Marland Kitchen aspirin 81 MG tablet Take 81 mg by mouth daily.        . carvedilol (COREG) 6.25 MG tablet Take 6.25 mg by mouth. 1/2 TAB  BID       . digoxin (LANOXIN) 0.125 MG tablet Take 125 mcg by mouth daily.        Marland Kitchen eplerenone (INSPRA) 25 MG tablet Take 25 mg by mouth daily.        . fenofibrate 160 MG tablet Take 160 mg by mouth daily.        . folic acid (FOLVITE) 800 MCG tablet Take 400 mcg by mouth daily.        . furosemide (LASIX) 40 MG tablet Take 40 mg by mouth 2 (two) times daily. 1/2 tablet         . lisinopril (PRINIVIL,ZESTRIL) 10 MG tablet  Take 10 mg by mouth daily.        Marland Kitchen LORazepam (ATIVAN) 1 MG tablet Take 1 mg by mouth every 8 (eight) hours.        . Multiple Vitamins-Minerals (MULTIVITAMIN WITH MINERALS) tablet Take 1 tablet by mouth daily.        . nitroGLYCERIN (NITROSTAT) 0.4 MG SL tablet Place 0.4 mg under the tongue every 5 (five) minutes as needed.        Marland Kitchen omeprazole (PRILOSEC) 40 MG capsule Take 40 mg by mouth daily.        . potassium chloride (KLOR-CON) 10 MEQ CR tablet Take 10 mEq by mouth daily.        . pravastatin (PRAVACHOL) 40 MG tablet Take 40 mg by mouth daily.        . sertraline (ZOLOFT) 100 MG tablet Take 100 mg by mouth daily.        Marland Kitchen warfarin (COUMADIN) 5 MG tablet Take 5 mg by mouth daily. UAD        Past Medical History  Diagnosis Date  . Coronary artery disease   . MI (myocardial infarction)     ANTERIOR  . Ischemic cardiomyopathy     EF  23%  . PAF (paroxysmal atrial fibrillation)   . Chronic renal insufficiency   . Dyslipidemia   . Depression   . ED (erectile dysfunction)   . Other primary cardiomyopathies      No Known Allergies  Review of Systems negative except from HPI and PMH  Physical Exam Well developed and well nourished in no acute distress HENT normal E scleral and icterus clear Neck Supple JVP flat; carotids brisk and full Clear to ausculation Regular rate and rhythm, no murmurs gallops or rub Soft with active bowel sounds No clubbing cyanosis and edema Alert and oriented, grossly normal motor and sensory function Skin Warm and Dry Affect flat      Assessment and  Plan

## 2011-12-09 NOTE — Telephone Encounter (Signed)
New msg Pt was here today and was told to call back about carvedilol He get it by [employer] thru [employer]

## 2011-12-09 NOTE — Assessment & Plan Note (Signed)
I would not say that noncompliance as much of the issue as it is continuing frustration with his lack of feeling better. I have Suggested to him that he consider a university-based second opinion where his records can be reviewed in [place].

## 2011-12-26 ENCOUNTER — Other Ambulatory Visit: Payer: Self-pay | Admitting: Cardiology

## 2012-01-04 ENCOUNTER — Ambulatory Visit (INDEPENDENT_AMBULATORY_CARE_PROVIDER_SITE_OTHER): Payer: 59 | Admitting: Cardiovascular Disease

## 2012-01-04 ENCOUNTER — Encounter: Payer: Self-pay | Admitting: Cardiovascular Disease

## 2012-01-04 DIAGNOSIS — I509 Heart failure, unspecified: Secondary | ICD-10-CM

## 2012-01-04 DIAGNOSIS — I2510 Atherosclerotic heart disease of native coronary artery without angina pectoris: Secondary | ICD-10-CM

## 2012-01-04 DIAGNOSIS — E785 Hyperlipidemia, unspecified: Secondary | ICD-10-CM

## 2012-01-04 DIAGNOSIS — Z95810 Presence of automatic (implantable) cardiac defibrillator: Secondary | ICD-10-CM

## 2012-01-04 NOTE — Assessment & Plan Note (Signed)
He's not having any episodes of angina. He seems to be frustrated by his lack of progress. He'll continue the same medications. He'll return to see [name] in 6 months.

## 2012-01-04 NOTE — Patient Instructions (Signed)
Your physician wants you to follow-up in: 6 months  You will receive a reminder letter in the mail two months in advance. If you don't receive a letter, please call our office to schedule the follow-up appointment.  Your physician recommends that you return for a FASTING lipid profile: 6 months   

## 2012-01-04 NOTE — Progress Notes (Signed)
[name] Date of Birth  Aug 08, 1946 [facility]     [place] Office  [address]    [address]   [address] [place], [place]  [address]    [place], [place]  [address] [phone]  Fax  [phone]  [phone]  Fax [phone]   History of Present Illness:  [name] is a 66 yo gentleman with a hx of CAD - status post anterior wall myocardial infarction. He's had a stent to the left anterior descending artery in July 2000. He has a subsequent cardiomyopathy with an ejection fraction of around 30%. He is an  Adult nurse pacer /  ICD placed. Has history of atrial fibrillation. He is on chronic Coumadin therapy.  He has a hx of hyperlipidemia, chronic renal insufficiency.  He is a former smoker and a former alcoholic.  He is a retired Transport planner.   He has his coumadin checked by [name].  He was last seen in our office in June, 2012.  He continues to struggle with controlling his diet.  He does all of his yard work without much difficulty.  He takes frequent breaks but is overall able to work for several hours.    He does have some dyspnea which has worsened since he has gained weight.   He is frustrated with the fact that does not feel any better.  He has tried Zocor, lipitor, and crestor and can only take Pravachol.  He is also on Tricor.  He typically sees [name] but was put on my schedule today for reasons that are not entirely clear.  Current Outpatient Prescriptions on File Prior to Visit  Medication Sig Dispense Refill  . Ascorbic Acid (VITAMIN C) 250 MG tablet Take 250 mg by mouth daily.        . carvedilol (COREG) 12.5 MG tablet Take 1 tablet (12.5 mg total) by mouth 2 (two) times daily.  180 tablet  3  . eplerenone (INSPRA) 25 MG tablet TAKE 1 TABLET DAILY  90 tablet  1  . fenofibrate 160 MG tablet Take 160 mg by mouth daily.        . folic acid (FOLVITE) 800 MCG tablet Take 400 mcg by mouth daily.        .  furosemide (LASIX) 40 MG tablet Take 20 mg by mouth 2 (two) times daily. 1/2 tablet         . lisinopril (PRINIVIL,ZESTRIL) 10 MG tablet TAKE ONE TABLET BY MOUTH DAILY.  90 tablet  3  . LORazepam (ATIVAN) 1 MG tablet Take 1 mg by mouth as needed.       . Multiple Vitamins-Minerals (MULTIVITAMIN WITH MINERALS) tablet Take 1 tablet by mouth daily.        . nitroGLYCERIN (NITROSTAT) 0.4 MG SL tablet Place 0.4 mg under the tongue every 5 (five) minutes as needed.        Marland Kitchen omeprazole (PRILOSEC) 40 MG capsule Take 40 mg by mouth daily.        . potassium chloride SA (K-DUR,KLOR-CON) 20 MEQ tablet TAKE 1 TABLET DAILY  90 tablet  3  . pravastatin (PRAVACHOL) 40 MG tablet TAKE 1 TABLET AT BEDTIME  90 tablet  2  . sertraline (ZOLOFT) 100 MG tablet Take 100 mg by mouth daily.        Marland Kitchen warfarin (COUMADIN) 5 MG tablet Take 5 mg by mouth daily. UAD         No Known Allergies  Past Medical History  Diagnosis Date  . Coronary artery disease   . MI (myocardial infarction)     ANTERIOR  . Ischemic cardiomyopathy     EF 23%  . PAF (paroxysmal atrial fibrillation)   . Chronic renal insufficiency   . Dyslipidemia   . Depression   . ED (erectile dysfunction)   . Other primary cardiomyopathies     Past Surgical History  Procedure Date  . Cardiac catheterization 02/05/2008    MODERATE TO SEVERE LEFT VENTRICULAR  DYSFUNCTION WITH EF 25-30%  . Coronary stent placement     LAD  . Transthoracic echocardiogram 12/2010    EF 30-35%  . Cardiac defibrillator placement     BI-VENTRICULAR ICD  . Wrist fracture surgery   . Breast lumpectomy     History  Smoking status  . Former Smoker  . Types: Cigarettes  . Quit date: 12/23/1998  Smokeless tobacco  . Not on file    History  Alcohol Use: Not on file    Family History  Problem Relation Age of Onset  . Breast cancer Mother   . Depression Mother   . Heart attack Father   . Breast cancer Sister     Reviw of Systems:  Reviewed in the HPI.   All other systems are negative.  Physical Exam: BP 133/74  Pulse 64  Ht 5\' 10"  (1.778 m)  Wt 266 lb 12.8 oz (121.02 kg)  BMI 38.28 kg/m2 The patient is alert and oriented x 3.  The mood and affect are normal.   Skin: warm and dry.  Color is normal.    HEENT:   [place]/AT, normal carotids, no JVD  Lungs: clear   Heart: RR, normal S1,S2    Abdomen: mild obesity  Extremities:  Trace - 1+ bilateral leg edema  Neuro:  nonfocal    ECG: NSR with Ventricular pacing  Assessment / Plan:

## 2012-01-04 NOTE — Assessment & Plan Note (Signed)
The patient has an ischemic cardiopathy. He's a bit frustrated at his lack of feeling better.  I think he feels a little bit better after talking to [name]. Graciela Husbands informed him that he may not feel better but that he would live longer if he took these medications. We will have him see [name] in 6 months for followup visit.

## 2012-01-06 ENCOUNTER — Other Ambulatory Visit (INDEPENDENT_AMBULATORY_CARE_PROVIDER_SITE_OTHER): Payer: 59 | Admitting: *Deleted

## 2012-01-06 DIAGNOSIS — E785 Hyperlipidemia, unspecified: Secondary | ICD-10-CM

## 2012-01-06 DIAGNOSIS — I2510 Atherosclerotic heart disease of native coronary artery without angina pectoris: Secondary | ICD-10-CM

## 2012-01-06 LAB — LDL CHOLESTEROL, DIRECT: Direct LDL: 137.6 mg/dL

## 2012-01-06 LAB — BASIC METABOLIC PANEL
Chloride: 101 mEq/L (ref 96–112)
Potassium: 4.2 mEq/L (ref 3.5–5.1)
Sodium: 142 mEq/L (ref 135–145)

## 2012-01-06 LAB — LIPID PANEL
Cholesterol: 203 mg/dL — ABNORMAL HIGH (ref 0–200)
Total CHOL/HDL Ratio: 4
Triglycerides: 110 mg/dL (ref 0.0–149.0)
VLDL: 22 mg/dL (ref 0.0–40.0)

## 2012-01-06 LAB — HEPATIC FUNCTION PANEL
AST: 18 U/L (ref 0–37)
Albumin: 4.1 g/dL (ref 3.5–5.2)

## 2012-01-09 ENCOUNTER — Telehealth: Payer: Self-pay | Admitting: Cardiology

## 2012-01-09 NOTE — Telephone Encounter (Signed)
After reviewing pt's chart, it does not look like he needs an appt this week.  He was notified of that and is requesting that his appt be canceled and is requesting a note be made in his chart regarding this.  Appt was canceled per his request.

## 2012-01-09 NOTE — Telephone Encounter (Signed)
Pt calling re appt 1-23, said didn't know anything about it, didn't agree to it and doesn't know why he needs it and why it's with stuckey, pls advise

## 2012-01-10 NOTE — Telephone Encounter (Signed)
[name] states he does not need a return call.

## 2012-01-10 NOTE — Telephone Encounter (Signed)
Fu call °Patient returning your call °

## 2012-01-11 ENCOUNTER — Ambulatory Visit: Payer: 59 | Admitting: Cardiology

## 2012-02-15 ENCOUNTER — Other Ambulatory Visit: Payer: Self-pay | Admitting: Cardiology

## 2012-03-12 ENCOUNTER — Ambulatory Visit (INDEPENDENT_AMBULATORY_CARE_PROVIDER_SITE_OTHER): Payer: 59 | Admitting: *Deleted

## 2012-03-12 ENCOUNTER — Encounter: Payer: Self-pay | Admitting: Internal Medicine

## 2012-03-12 DIAGNOSIS — I509 Heart failure, unspecified: Secondary | ICD-10-CM

## 2012-03-12 DIAGNOSIS — I428 Other cardiomyopathies: Secondary | ICD-10-CM

## 2012-03-12 LAB — ICD DEVICE OBSERVATION
AL IMPEDENCE ICD: 520 Ohm
BATTERY VOLTAGE: 2.97 V
BRDY-0004LV: 120 {beats}/min
LV LEAD IMPEDENCE ICD: 720 Ohm
LV LEAD THRESHOLD: 1.5 V
TOT-0002: 0
TOT-0006: 20090226000000
TZAT-0001ATACH: 1
TZAT-0001ATACH: 2
TZAT-0001ATACH: 3
TZAT-0001SLOWVT: 1
TZAT-0001SLOWVT: 2
TZAT-0002ATACH: NEGATIVE
TZAT-0002ATACH: NEGATIVE
TZAT-0002FASTVT: NEGATIVE
TZAT-0004SLOWVT: 8
TZAT-0004SLOWVT: 8
TZAT-0005SLOWVT: 88 pct
TZAT-0005SLOWVT: 91 pct
TZAT-0012ATACH: 150 ms
TZAT-0012ATACH: 150 ms
TZAT-0012FASTVT: 200 ms
TZAT-0018ATACH: NEGATIVE
TZAT-0018ATACH: NEGATIVE
TZAT-0018ATACH: NEGATIVE
TZAT-0018FASTVT: NEGATIVE
TZAT-0018SLOWVT: NEGATIVE
TZAT-0018SLOWVT: NEGATIVE
TZAT-0019ATACH: 6 V
TZAT-0019ATACH: 6 V
TZAT-0019FASTVT: 8 V
TZAT-0020ATACH: 1.5 ms
TZAT-0020SLOWVT: 1.5 ms
TZAT-0020SLOWVT: 1.5 ms
TZON-0003VSLOWVT: 400 ms
TZON-0004VSLOWVT: 20
TZST-0001ATACH: 6
TZST-0001FASTVT: 5
TZST-0001FASTVT: 6
TZST-0001SLOWVT: 3
TZST-0001SLOWVT: 5
TZST-0001SLOWVT: 6
TZST-0002ATACH: NEGATIVE
TZST-0002ATACH: NEGATIVE
TZST-0002FASTVT: NEGATIVE
TZST-0002FASTVT: NEGATIVE
TZST-0003SLOWVT: 25 J
TZST-0003SLOWVT: 35 J
VENTRICULAR PACING ICD: 89.79 pct

## 2012-03-12 NOTE — Progress Notes (Signed)
ICD check with ICM 

## 2012-05-19 ENCOUNTER — Other Ambulatory Visit: Payer: Self-pay | Admitting: Cardiology

## 2012-06-01 ENCOUNTER — Encounter: Payer: Self-pay | Admitting: *Deleted

## 2012-06-14 ENCOUNTER — Encounter: Payer: Self-pay | Admitting: Internal Medicine

## 2012-06-14 ENCOUNTER — Ambulatory Visit (INDEPENDENT_AMBULATORY_CARE_PROVIDER_SITE_OTHER): Payer: 59 | Admitting: Internal Medicine

## 2012-06-14 VITALS — BP 121/78 | HR 71 | Ht 70.0 in | Wt 258.1 lb

## 2012-06-14 DIAGNOSIS — I4891 Unspecified atrial fibrillation: Secondary | ICD-10-CM

## 2012-06-14 DIAGNOSIS — I2510 Atherosclerotic heart disease of native coronary artery without angina pectoris: Secondary | ICD-10-CM

## 2012-06-14 DIAGNOSIS — Z95810 Presence of automatic (implantable) cardiac defibrillator: Secondary | ICD-10-CM

## 2012-06-14 DIAGNOSIS — I509 Heart failure, unspecified: Secondary | ICD-10-CM

## 2012-06-14 LAB — ICD DEVICE OBSERVATION
AL AMPLITUDE: 3.5497 mv
ATRIAL PACING ICD: 7.76 pct
CHARGE TIME: 11.411 s
LV LEAD IMPEDENCE ICD: 744 Ohm
RV LEAD AMPLITUDE: 11.1 mv
RV LEAD IMPEDENCE ICD: 416 Ohm
TOT-0001: 1
TOT-0006: 20090226000000
TZAT-0001SLOWVT: 1
TZAT-0001SLOWVT: 2
TZAT-0002ATACH: NEGATIVE
TZAT-0002ATACH: NEGATIVE
TZAT-0002FASTVT: NEGATIVE
TZAT-0011SLOWVT: 10 ms
TZAT-0011SLOWVT: 10 ms
TZAT-0012FASTVT: 200 ms
TZAT-0018ATACH: NEGATIVE
TZAT-0018SLOWVT: NEGATIVE
TZAT-0018SLOWVT: NEGATIVE
TZAT-0019ATACH: 6 V
TZAT-0019ATACH: 6 V
TZAT-0019SLOWVT: 8 V
TZAT-0019SLOWVT: 8 V
TZAT-0020ATACH: 1.5 ms
TZAT-0020SLOWVT: 1.5 ms
TZAT-0020SLOWVT: 1.5 ms
TZON-0005SLOWVT: 12
TZST-0001ATACH: 5
TZST-0001FASTVT: 3
TZST-0001FASTVT: 4
TZST-0001FASTVT: 5
TZST-0001SLOWVT: 4
TZST-0002ATACH: NEGATIVE
TZST-0002ATACH: NEGATIVE
TZST-0002FASTVT: NEGATIVE
TZST-0002FASTVT: NEGATIVE
TZST-0003SLOWVT: 35 J
TZST-0003SLOWVT: 35 J
VF: 0

## 2012-06-14 NOTE — Assessment & Plan Note (Signed)
Intercurrent AF 18%>>4%

## 2012-06-14 NOTE — Patient Instructions (Signed)
Your physician recommends that you continue on your current medications as directed. Please refer to the Current Medication list given to you today.  Your physician recommends that you schedule a follow-up appointment in: 3 months with [name] for a device check.  Your physician wants you to follow-up in: 6 months with [name] & 1 year with [name]. You will receive a reminder letter in the mail two months in advance. If you don't receive a letter, please call our office to schedule the follow-up appointment.

## 2012-06-14 NOTE — Assessment & Plan Note (Signed)
withougt chest pain

## 2012-06-14 NOTE — Assessment & Plan Note (Signed)
Continue Guideline directed medical therapy euvolemic

## 2012-06-14 NOTE — Progress Notes (Signed)
HPI  [name] is a 66 y.o. male seen in followup for congestive heart failure in the setting of a mixed cardiomyopathy. He is status post CRT-D for the above.  He also has atrial fibrillation previously and now again on amiodarone  We stopped his amio He is doing pretty well without worseing of dyspnea and edema    Past Medical History  Diagnosis Date  . Coronary artery disease   . MI (myocardial infarction)     ANTERIOR  . Ischemic cardiomyopathy     EF 23%  . PAF (paroxysmal atrial fibrillation)   . Chronic renal insufficiency   . Dyslipidemia   . Depression   . ED (erectile dysfunction)   . Other primary cardiomyopathies     Past Surgical History  Procedure Date  . Cardiac catheterization 02/05/2008    MODERATE TO SEVERE LEFT VENTRICULAR  DYSFUNCTION WITH EF 25-30%  . Coronary stent placement     LAD  . Transthoracic echocardiogram 12/2010    EF 30-35%  . Cardiac defibrillator placement     BI-VENTRICULAR ICD  . Wrist fracture surgery   . Breast lumpectomy     Current Outpatient Prescriptions  Medication Sig Dispense Refill  . acetaminophen-codeine (TYLENOL #3) 300-30 MG per tablet Take 1-2 tablets by mouth every 6 (six) hours as needed. As needed for dental procedure.      . Ascorbic Acid (VITAMIN C) 250 MG tablet Take 250 mg by mouth daily.        . carvedilol (COREG) 12.5 MG tablet Take 1 tablet (12.5 mg total) by mouth 2 (two) times daily.  180 tablet  3  . doxycycline (VIBRA-TABS) 100 MG tablet Take 100 mg by mouth 2 (two) times daily.       Marland Kitchen eplerenone (INSPRA) 25 MG tablet TAKE 1 TABLET DAILY  90 tablet  0  . fenofibrate 160 MG tablet TAKE 1 TABLET DAILY  90 tablet  2  . folic acid (FOLVITE) 800 MCG tablet Take 400 mcg by mouth daily.        . furosemide (LASIX) 40 MG tablet Take 20 mg by mouth 2 (two) times daily. 1/2 tablet         . lisinopril (PRINIVIL,ZESTRIL) 10 MG tablet TAKE ONE TABLET BY MOUTH DAILY.  90 tablet  3  . LORazepam (ATIVAN) 1 MG  tablet Take 1 mg by mouth as needed.       . Multiple Vitamins-Minerals (MULTIVITAMIN WITH MINERALS) tablet Take 1 tablet by mouth daily.        . nitroGLYCERIN (NITROSTAT) 0.4 MG SL tablet Place 0.4 mg under the tongue every 5 (five) minutes as needed.        Marland Kitchen omeprazole (PRILOSEC) 40 MG capsule Take 40 mg by mouth daily.        . potassium chloride SA (K-DUR,KLOR-CON) 20 MEQ tablet Take 10 mEq by mouth daily.       . pravastatin (PRAVACHOL) 40 MG tablet TAKE 1 TABLET AT BEDTIME  90 tablet  2  . sertraline (ZOLOFT) 100 MG tablet Take 100 mg by mouth daily.        Marland Kitchen warfarin (COUMADIN) 5 MG tablet Take 5 mg by mouth daily. UAD         No Known Allergies  Review of Systems negative except from HPI and PMH  Physical Exam BP 121/78  Pulse 71  Ht 5\' 10"  (1.778 m)  Wt 258 lb 1.9 oz (117.082 kg)  BMI 37.04 kg/m2 Well  developed and well nourished in no acute distress HENT normal E scleral and icterus clear Neck Supple JVP flat; carotids brisk and full Clear to ausculation Regular rate and rhythm, no murmurs gallops or rub Soft with active bowel sounds No clubbing cyanosis none Edema Alert and oriented, grossly normal motor and sensory function Skin Warm and Dry    Assessment and  Plan

## 2012-06-14 NOTE — Assessment & Plan Note (Signed)
The patient's device was interrogated.  The information was reviewed. No changes were made in the programming.    

## 2012-06-17 ENCOUNTER — Other Ambulatory Visit: Payer: Self-pay | Admitting: Cardiology

## 2012-07-22 ENCOUNTER — Other Ambulatory Visit: Payer: Self-pay | Admitting: Cardiology

## 2012-07-23 NOTE — Telephone Encounter (Signed)
Fax Received. Refill Completed. [name] (R.M.A)   

## 2012-08-29 ENCOUNTER — Telehealth: Payer: Self-pay

## 2012-08-29 MED ORDER — EPLERENONE 25 MG PO TABS: 25.0000 mg | ORAL_TABLET | Freq: Every day | ORAL | Status: DC

## 2012-08-29 NOTE — Telephone Encounter (Signed)
Received fax request for refill on eplerenone 25 mg daily.Refill sent to [facility].

## 2012-09-03 ENCOUNTER — Other Ambulatory Visit: Payer: Self-pay | Admitting: Internal Medicine

## 2012-09-03 NOTE — Telephone Encounter (Signed)
[address] [facility]  30 day supply .

## 2012-09-04 NOTE — Telephone Encounter (Signed)
Pt called stated he has not received his eplerenone (INSPRA) 25 MH tablets - 1 qd - 90 day supple from [facility], he was requesting a 30 day supple be sent to [facility] [address] [place], [place].  Caralee Ates,  CMA  Called in eplerenone (INSPRA) 25 MH tablets - 1 qd - 30 day supply with 1 refill.  [facility] [address] [place], [place].  Caralee Ates, CMA  Called the pt back informed him I had just called and spoke with the pharmacy, and that I had called in eplerenone (INSPRA) 25 MH tablets - 1 qd - 30 day supply with 1 refill.  Caralee Ates, CMA

## 2012-09-17 ENCOUNTER — Ambulatory Visit (INDEPENDENT_AMBULATORY_CARE_PROVIDER_SITE_OTHER): Payer: 59 | Admitting: *Deleted

## 2012-09-17 ENCOUNTER — Encounter: Payer: Self-pay | Admitting: Internal Medicine

## 2012-09-17 DIAGNOSIS — I509 Heart failure, unspecified: Secondary | ICD-10-CM

## 2012-09-17 DIAGNOSIS — I428 Other cardiomyopathies: Secondary | ICD-10-CM

## 2012-09-17 LAB — ICD DEVICE OBSERVATION
AL IMPEDENCE ICD: 512 Ohm
ATRIAL PACING ICD: 11.07 pct
CHARGE TIME: 11.931 s
PACEART VT: 0
RV LEAD AMPLITUDE: 12.7082 mv
RV LEAD IMPEDENCE ICD: 408 Ohm
TOT-0001: 1
TOT-0002: 0
TZAT-0001ATACH: 1
TZAT-0001ATACH: 3
TZAT-0001FASTVT: 1
TZAT-0001SLOWVT: 1
TZAT-0001SLOWVT: 2
TZAT-0002ATACH: NEGATIVE
TZAT-0002FASTVT: NEGATIVE
TZAT-0012ATACH: 150 ms
TZAT-0012ATACH: 150 ms
TZAT-0012SLOWVT: 200 ms
TZAT-0012SLOWVT: 200 ms
TZAT-0013SLOWVT: 2
TZAT-0013SLOWVT: 2
TZAT-0018ATACH: NEGATIVE
TZAT-0018ATACH: NEGATIVE
TZAT-0018FASTVT: NEGATIVE
TZAT-0018SLOWVT: NEGATIVE
TZAT-0018SLOWVT: NEGATIVE
TZAT-0019ATACH: 6 V
TZAT-0020ATACH: 1.5 ms
TZAT-0020SLOWVT: 1.5 ms
TZAT-0020SLOWVT: 1.5 ms
TZON-0003ATACH: 350 ms
TZON-0003SLOWVT: 340 ms
TZON-0004SLOWVT: 16
TZON-0004VSLOWVT: 20
TZON-0005SLOWVT: 12
TZST-0001ATACH: 4
TZST-0001ATACH: 5
TZST-0001ATACH: 6
TZST-0001FASTVT: 3
TZST-0001FASTVT: 4
TZST-0001SLOWVT: 3
TZST-0001SLOWVT: 5
TZST-0001SLOWVT: 6
TZST-0002ATACH: NEGATIVE
TZST-0002ATACH: NEGATIVE
TZST-0002ATACH: NEGATIVE
TZST-0002FASTVT: NEGATIVE
TZST-0002FASTVT: NEGATIVE
TZST-0002FASTVT: NEGATIVE
TZST-0003SLOWVT: 25 J
TZST-0003SLOWVT: 35 J

## 2012-09-17 NOTE — Progress Notes (Signed)
ICD check with ICM 

## 2012-09-28 ENCOUNTER — Telehealth: Payer: Self-pay | Admitting: Cardiology

## 2012-09-28 NOTE — Telephone Encounter (Signed)
Potassium chloride er , [facility] mail order 90 days, as well as 30 day supply at [facility], pls call when done pt now out [phone] ok to leave message, pt is now out said [facility]-30 as of today no response, this also happened last month where they faxed twice with no response, pt would like [place] to fin out what the problem is so he doesn't have to go through this every time, pt very understanding but would like to get it resolved

## 2012-10-01 NOTE — Telephone Encounter (Signed)
RX sent into [facility] verbally and RX called into [facility] on [address] for a 30 day supply due to the patient being out of medications.

## 2012-10-03 ENCOUNTER — Other Ambulatory Visit: Payer: Self-pay | Admitting: Gastroenterology

## 2012-11-07 ENCOUNTER — Encounter: Payer: Self-pay | Admitting: Cardiology

## 2012-11-21 ENCOUNTER — Other Ambulatory Visit: Payer: Self-pay

## 2012-11-21 MED ORDER — FENOFIBRATE 160 MG PO TABS: 160.0000 mg | ORAL_TABLET | Freq: Every day | ORAL | Status: DC

## 2012-11-28 ENCOUNTER — Other Ambulatory Visit: Payer: Self-pay

## 2012-11-28 ENCOUNTER — Telehealth: Payer: Self-pay

## 2012-11-28 MED ORDER — FENOFIBRATE 160 MG PO TABS: 160.0000 mg | ORAL_TABLET | Freq: Every day | ORAL | Status: DC

## 2012-11-28 NOTE — Telephone Encounter (Signed)
Called and spoke with pt and he agreed to plan of sending in [facility] day supply to [facility] [address] and a 90 day supply to [facility]

## 2012-11-28 NOTE — Telephone Encounter (Signed)
Doctor, general practice pharmacy and spoke to pharmacy tech KIM to make sure they receiver refill for Fenofibrate 160 MG 30 day supply no refills and that it will be ready for pickup by pt.

## 2012-12-05 ENCOUNTER — Encounter: Payer: Self-pay | Admitting: Cardiology

## 2012-12-05 LAB — PROTIME-INR: INR: 2.2 — AB (ref 0.9–1.1)

## 2012-12-17 ENCOUNTER — Ambulatory Visit (INDEPENDENT_AMBULATORY_CARE_PROVIDER_SITE_OTHER): Payer: 59 | Admitting: *Deleted

## 2012-12-17 ENCOUNTER — Encounter: Payer: Self-pay | Admitting: Internal Medicine

## 2012-12-17 DIAGNOSIS — Z95810 Presence of automatic (implantable) cardiac defibrillator: Secondary | ICD-10-CM

## 2012-12-17 DIAGNOSIS — I428 Other cardiomyopathies: Secondary | ICD-10-CM

## 2012-12-17 DIAGNOSIS — I509 Heart failure, unspecified: Secondary | ICD-10-CM

## 2012-12-17 LAB — ICD DEVICE OBSERVATION
ATRIAL PACING ICD: 4.01 pct
BAMS-0001: 170 {beats}/min
CHARGE TIME: 11.931 s
LV LEAD IMPEDENCE ICD: 768 Ohm
RV LEAD IMPEDENCE ICD: 392 Ohm
RV LEAD THRESHOLD: 1 V
TOT-0001: 1
TOT-0006: 20090226000000
TZAT-0001FASTVT: 1
TZAT-0002ATACH: NEGATIVE
TZAT-0011SLOWVT: 10 ms
TZAT-0011SLOWVT: 10 ms
TZAT-0012ATACH: 150 ms
TZAT-0012SLOWVT: 200 ms
TZAT-0012SLOWVT: 200 ms
TZAT-0013SLOWVT: 2
TZAT-0013SLOWVT: 2
TZAT-0018ATACH: NEGATIVE
TZAT-0018SLOWVT: NEGATIVE
TZAT-0018SLOWVT: NEGATIVE
TZAT-0019ATACH: 6 V
TZAT-0019ATACH: 6 V
TZAT-0019SLOWVT: 8 V
TZAT-0019SLOWVT: 8 V
TZAT-0020ATACH: 1.5 ms
TZAT-0020ATACH: 1.5 ms
TZAT-0020FASTVT: 1.5 ms
TZON-0003ATACH: 350 ms
TZON-0003SLOWVT: 340 ms
TZON-0005SLOWVT: 12
TZST-0001ATACH: 4
TZST-0001ATACH: 5
TZST-0001FASTVT: 2
TZST-0001FASTVT: 3
TZST-0001FASTVT: 4
TZST-0001SLOWVT: 4
TZST-0002ATACH: NEGATIVE
TZST-0002ATACH: NEGATIVE
TZST-0002FASTVT: NEGATIVE
TZST-0003SLOWVT: 20 J
TZST-0003SLOWVT: 35 J
VENTRICULAR PACING ICD: 79.02 pct
VF: 0

## 2012-12-17 NOTE — Progress Notes (Signed)
Pt seen in clinic for follow up of ICD.  No complaints of chest pain, shortness of breath, dizziness, palpitations, or shocks.  Device functioning normally at this time. Pt in underlying atrial flutter with RVR today at a rate of 110bpm.  Pt denies symptoms currently.  Will let Dr Graciela Husbands and [place] know.   For full details, see [facility] report.  No programming changes made today.  Plan to follow up in 3 months with clinic  Gypsy Balsam, RN, BSN 12/17/2012 9:49 AM

## 2012-12-19 HISTORY — PX: IMPLANTABLE CARDIOVERTER DEFIBRILLATOR GENERATOR CHANGE: SHX5859

## 2012-12-25 ENCOUNTER — Ambulatory Visit (INDEPENDENT_AMBULATORY_CARE_PROVIDER_SITE_OTHER): Payer: 59 | Admitting: Cardiology

## 2012-12-25 ENCOUNTER — Encounter: Payer: Self-pay | Admitting: Cardiology

## 2012-12-25 ENCOUNTER — Telehealth: Payer: Self-pay | Admitting: Cardiology

## 2012-12-25 VITALS — BP 104/70 | HR 103 | Ht 70.0 in | Wt 261.2 lb

## 2012-12-25 DIAGNOSIS — Z95810 Presence of automatic (implantable) cardiac defibrillator: Secondary | ICD-10-CM

## 2012-12-25 DIAGNOSIS — I4891 Unspecified atrial fibrillation: Secondary | ICD-10-CM

## 2012-12-25 DIAGNOSIS — I509 Heart failure, unspecified: Secondary | ICD-10-CM

## 2012-12-25 DIAGNOSIS — I10 Essential (primary) hypertension: Secondary | ICD-10-CM

## 2012-12-25 DIAGNOSIS — I2510 Atherosclerotic heart disease of native coronary artery without angina pectoris: Secondary | ICD-10-CM

## 2012-12-25 MED ORDER — NITROGLYCERIN 0.4 MG SL SUBL: 0.4000 mg | SUBLINGUAL_TABLET | SUBLINGUAL | Status: DC | PRN

## 2012-12-25 MED ORDER — CARVEDILOL 25 MG PO TABS: 25.0000 mg | ORAL_TABLET | Freq: Two times a day (BID) | ORAL | Status: DC

## 2012-12-25 NOTE — Telephone Encounter (Signed)
pt calling back re dosage of carvedilol , 12.5mg  , ok to call and leave a message if needed

## 2012-12-25 NOTE — Patient Instructions (Addendum)
Continue your current medication except we will double your dose of carvedilol. Let me know what size tablet you have.  I will see you again in 6 months

## 2012-12-25 NOTE — Telephone Encounter (Signed)
Patient called stated he is taking carvedilol 12.5 mg twice a day.Dr.Jordan advised to increase to 25 mg twice a day.

## 2012-12-25 NOTE — Progress Notes (Signed)
[name] Date of Birth: 06-06-1946 Medical Record #[id number]  History of Present Illness: [name] is seen today for followup. He was last seen by me in June of 2012. He has a history of remote anterior myocardial infarction with an ischemic cardiomyopathy and ejection fraction of 23%. He is status post CRT D. he also has a history of atrial fibrillation. He was on amiodarone and digoxin in the past. These were discontinued this past year since he was having little episodes of atrial fibrillation and complained of constant fatigue. He actually is asymptomatic from his episodes of atrial fibrillation. On his recent pacemaker evaluation he was noted to have more frequent atrial fibrillation up to 45% of the time and his heart rate was not adequately controlled. He is unaware of any of this. He currently denies any dyspnea. He has had no chest pain or syncope. His weight has been stable.  Current Outpatient Prescriptions on File Prior to Visit  Medication Sig Dispense Refill  . acetaminophen-codeine (TYLENOL #3) 300-30 MG per tablet Take 1-2 tablets by mouth every 6 (six) hours as needed. As needed for dental procedure.      . Ascorbic Acid (VITAMIN C) 250 MG tablet Take 250 mg by mouth daily.        Marland Kitchen doxycycline (VIBRA-TABS) 100 MG tablet Take 100 mg by mouth 2 (two) times daily.       Marland Kitchen eplerenone (INSPRA) 25 MG tablet Take 1 tablet (25 mg total) by mouth daily.  90 tablet  3  . fenofibrate (TRICOR) 48 MG tablet Take 48 mg by mouth daily.      . fenofibrate 160 MG tablet Take 1 tablet (160 mg total) by mouth daily.  90 tablet  1  . folic acid (FOLVITE) 800 MCG tablet Take 400 mcg by mouth daily.        . furosemide (LASIX) 40 MG tablet Take 20 mg by mouth 2 (two) times daily. 1/2 tablet         . lisinopril (PRINIVIL,ZESTRIL) 10 MG tablet TAKE ONE TABLET BY MOUTH DAILY.  90 tablet  3  . LORazepam (ATIVAN) 1 MG tablet Take 1 mg by mouth as needed.       . Multiple Vitamins-Minerals  (MULTIVITAMIN WITH MINERALS) tablet Take 1 tablet by mouth daily.        . nitroGLYCERIN (NITROSTAT) 0.4 MG SL tablet Place 1 tablet (0.4 mg total) under the tongue every 5 (five) minutes as needed.  25 tablet  6  . omeprazole (PRILOSEC) 40 MG capsule Take 40 mg by mouth daily.        . potassium chloride SA (K-DUR,KLOR-CON) 20 MEQ tablet Take 10 mEq by mouth daily.       . pravastatin (PRAVACHOL) 40 MG tablet TAKE 1 TABLET AT BEDTIME  90 tablet  3  . sertraline (ZOLOFT) 100 MG tablet Take 100 mg by mouth daily.        Marland Kitchen warfarin (COUMADIN) 5 MG tablet Take 5 mg by mouth daily. UAD         No Known Allergies  Past Medical History  Diagnosis Date  . Coronary artery disease   . MI (myocardial infarction)     ANTERIOR  . Ischemic cardiomyopathy     EF 23%  . PAF (paroxysmal atrial fibrillation)   . Chronic renal insufficiency   . Dyslipidemia   . Depression   . ED (erectile dysfunction)   . Other primary cardiomyopathies  Past Surgical History  Procedure Date  . Cardiac catheterization 02/05/2008    MODERATE TO SEVERE LEFT VENTRICULAR  DYSFUNCTION WITH EF 25-30%  . Coronary stent placement     LAD  . Transthoracic echocardiogram 12/2010    EF 30-35%  . Cardiac defibrillator placement     BI-VENTRICULAR ICD  . Wrist fracture surgery   . Breast lumpectomy     History  Smoking status  . Former Smoker  . Types: Cigarettes  . Quit date: 12/23/1998  Smokeless tobacco  . Not on file    History  Alcohol Use: Not on file    Family History  Problem Relation Age of Onset  . Breast cancer Mother   . Depression Mother   . Heart attack Father   . Breast cancer Sister     Review of Systems: The review of systems is positive for laser, surgery this year. He did fall a couple of months ago and has some persistent pain below his left knee. All other systems were reviewed and are negative.  Physical Exam: BP 104/70  Pulse 103  Ht 5\' 10"  (1.778 m)  Wt 261 lb 3.2 oz  (118.48 kg)  BMI 37.48 kg/m2  SpO2 97% He is an obese white male in no acute distress. HEENT: Unremarkable No JVD or bruits. Lungs: Clear Cardiovascular: Regular rate and rhythm, normal S1 and S2. Grade 2/6 systolic murmur at apex. Abdomen: Obese, soft, nontender. No masses or bruits. Extremities: Chronic 2+ edema below the knees. Skin: Warm and dry Neuro: Alert oriented x3. Cranial nerves II through XII are intact. LABORATORY DATA: ECG today demonstrates atrial fibrillation with paced ventricular rhythm and rate of 108 beats per minute.  Assessment / Plan: 1. Atrial fibrillation. He has more frequent breakthrough now that his amiodarone was was discontinued last year. He is asymptomatic. I recommended long-term therapy with rate control and anticoagulation. We will increase his carvedilol to 25 mg twice a day. We'll reassess his heart rate control and his next pacemaker check.  2. Coronary disease with remote anterior myocardial infarction. He is asymptomatic.  3. Chronic systolic congestive heart failure. He is on optimal therapy with carvedilol, ACE inhibitor, diuretics, and Inspra. I stressed the importance of dietary sodium restriction. I recommended regular aerobic exercise to improve his overall cardiovascular conditioning.

## 2012-12-28 ENCOUNTER — Telehealth: Payer: Self-pay | Admitting: Cardiology

## 2012-12-28 MED ORDER — LISINOPRIL 10 MG PO TABS: 10.0000 mg | ORAL_TABLET | Freq: Every day | ORAL | Status: DC

## 2012-12-28 NOTE — Telephone Encounter (Signed)
New Problem:    Patient called in needing a 90 day refill of his lisinopril (PRINIVIL,ZESTRIL) 10 MG tablet.  Please call back once this has been filled.

## 2013-01-25 ENCOUNTER — Telehealth: Payer: Self-pay | Admitting: Internal Medicine

## 2013-01-25 NOTE — Telephone Encounter (Signed)
New problem    C/O weight gain of  5 lbs. Swelling in ankles & feet . Should lasix be adjustment.

## 2013-01-25 NOTE — Telephone Encounter (Signed)
Spoke to patient he stated he has gained 5 lbs over the past week.States he has swelling in ankles and feet,sob.States he has ate more this past week. Advised of a better diet.  Dr.Jordan is out of office today will check with Norma Fredrickson NP and call back.

## 2013-01-25 NOTE — Telephone Encounter (Signed)
Patient called was told spoke to Dr.Brackbill he advised ok to increase lasix to 40 mg twice a day for 2 days then back to normal dose 20 mg twice a day.Advised needs bmet in 1 week.Patient stated he will have done at PCP Dr.Burnett's office.Advised to have faxed to Dr.Jordan.

## 2013-01-25 NOTE — Telephone Encounter (Signed)
[name] is the patient's primary cardiologist. He just saw him on 12/25/12- will forward to [facility], Dr. Elvis Coil nurse.

## 2013-02-26 ENCOUNTER — Encounter (HOSPITAL_BASED_OUTPATIENT_CLINIC_OR_DEPARTMENT_OTHER): Payer: Self-pay

## 2013-02-26 ENCOUNTER — Emergency Department (HOSPITAL_BASED_OUTPATIENT_CLINIC_OR_DEPARTMENT_OTHER): Payer: 59

## 2013-02-26 ENCOUNTER — Other Ambulatory Visit: Payer: Self-pay

## 2013-02-26 ENCOUNTER — Emergency Department (HOSPITAL_BASED_OUTPATIENT_CLINIC_OR_DEPARTMENT_OTHER)
Admission: EM | Admit: 2013-02-26 | Discharge: 2013-02-26 | Disposition: A | Discharge status: Home / Self Care | Payer: 59 | Attending: Emergency Medicine | Admitting: Emergency Medicine

## 2013-02-26 DIAGNOSIS — I252 Old myocardial infarction: Secondary | ICD-10-CM | POA: Insufficient documentation

## 2013-02-26 DIAGNOSIS — N189 Chronic kidney disease, unspecified: Secondary | ICD-10-CM | POA: Insufficient documentation

## 2013-02-26 DIAGNOSIS — J209 Acute bronchitis, unspecified: Secondary | ICD-10-CM | POA: Insufficient documentation

## 2013-02-26 DIAGNOSIS — Z87891 Personal history of nicotine dependence: Secondary | ICD-10-CM | POA: Insufficient documentation

## 2013-02-26 DIAGNOSIS — F329 Major depressive disorder, single episode, unspecified: Secondary | ICD-10-CM | POA: Insufficient documentation

## 2013-02-26 DIAGNOSIS — R5383 Other fatigue: Secondary | ICD-10-CM | POA: Insufficient documentation

## 2013-02-26 DIAGNOSIS — J3489 Other specified disorders of nose and nasal sinuses: Secondary | ICD-10-CM | POA: Insufficient documentation

## 2013-02-26 DIAGNOSIS — Z87448 Personal history of other diseases of urinary system: Secondary | ICD-10-CM | POA: Insufficient documentation

## 2013-02-26 DIAGNOSIS — Z8673 Personal history of transient ischemic attack (TIA), and cerebral infarction without residual deficits: Secondary | ICD-10-CM | POA: Insufficient documentation

## 2013-02-26 DIAGNOSIS — Z7901 Long term (current) use of anticoagulants: Secondary | ICD-10-CM | POA: Insufficient documentation

## 2013-02-26 DIAGNOSIS — E785 Hyperlipidemia, unspecified: Secondary | ICD-10-CM | POA: Insufficient documentation

## 2013-02-26 DIAGNOSIS — Z8679 Personal history of other diseases of the circulatory system: Secondary | ICD-10-CM | POA: Insufficient documentation

## 2013-02-26 DIAGNOSIS — R5381 Other malaise: Secondary | ICD-10-CM | POA: Insufficient documentation

## 2013-02-26 DIAGNOSIS — F3289 Other specified depressive episodes: Secondary | ICD-10-CM | POA: Insufficient documentation

## 2013-02-26 DIAGNOSIS — I2510 Atherosclerotic heart disease of native coronary artery without angina pectoris: Secondary | ICD-10-CM | POA: Insufficient documentation

## 2013-02-26 DIAGNOSIS — Z79899 Other long term (current) drug therapy: Secondary | ICD-10-CM | POA: Insufficient documentation

## 2013-02-26 LAB — CBC
MCH: 26 pg (ref 26.0–34.0)
MCV: 78.9 fL (ref 78.0–100.0)
Platelets: 227 10*3/uL (ref 150–400)
RDW: 15.3 % (ref 11.5–15.5)

## 2013-02-26 LAB — COMPREHENSIVE METABOLIC PANEL
Alkaline Phosphatase: 35 U/L — ABNORMAL LOW (ref 39–117)
BUN: 33 mg/dL — ABNORMAL HIGH (ref 6–23)
Calcium: 9.6 mg/dL (ref 8.4–10.5)
GFR calc Af Amer: 39 mL/min — ABNORMAL LOW (ref >90)
Glucose, Bld: 105 mg/dL — ABNORMAL HIGH (ref 70–99)
Total Protein: 7.2 g/dL (ref 6.0–8.3)

## 2013-02-26 MED ORDER — BENZONATATE 100 MG PO CAPS: 100.0000 mg | ORAL_CAPSULE | Freq: Three times a day (TID) | ORAL | Status: DC | PRN

## 2013-02-26 MED ORDER — ALBUTEROL SULFATE HFA 108 (90 BASE) MCG/ACT IN AERS
1.0000 | INHALATION_SPRAY | RESPIRATORY_TRACT | Status: DC
  Administered 2013-02-26: 2 via RESPIRATORY_TRACT
  Filled 2013-02-26: qty 6.7

## 2013-02-26 MED ORDER — GUAIFENESIN ER 1200 MG PO TB12: 1.0000 | ORAL_TABLET | Freq: Two times a day (BID) | ORAL | Status: DC

## 2013-02-26 NOTE — ED Notes (Signed)
Chest/head congestion/chills/prod cough/fatigue x 4 days

## 2013-02-26 NOTE — ED Provider Notes (Signed)
History    CSN: [id number] Arrival date & time 02/26/13  1353 First MD Initiated Contact with Patient 02/26/13 1404      Chief Complaint  Patient presents with  . Cough    HPI The patient presents to the emergency room with complaints of cough and congestion. He said symptoms for at least the last 4 days. Started with nasal and sinus congestion. He has also had a productive cough as well as some fatigue for the last 4 days. Patient does have history of ischemic cardiomyopathy and he states generally he does get some shortness of breath when he exerts himself.  This may be slightly worse but not significantly so. He  also had some  Mild to moderatediscomfort in his chest when he coughs. It's in the bilateral lower chest area. He denies any leg swelling. He does not have any history of pulmonary and was DVT  Patient went to see his primary doctor today. They were unable to do an x-ray in the office. He was sent to the emergency room for further evaluation  Past Medical History  Diagnosis Date  . Coronary artery disease   . MI (myocardial infarction)     ANTERIOR  . Ischemic cardiomyopathy     EF 23%  . PAF (paroxysmal atrial fibrillation)   . Chronic renal insufficiency   . Dyslipidemia   . Depression   . ED (erectile dysfunction)   . Other primary cardiomyopathies     Past Surgical History  Procedure Laterality Date  . Cardiac catheterization  02/05/2008    MODERATE TO SEVERE LEFT VENTRICULAR  DYSFUNCTION WITH EF 25-30%  . Coronary stent placement      LAD  . Transthoracic echocardiogram  12/2010    EF 30-35%  . Cardiac defibrillator placement      BI-VENTRICULAR ICD  . Wrist fracture surgery    . Breast lumpectomy    . Cardiac defibrillator placement      Family History  Problem Relation Age of Onset  . Breast cancer Mother   . Depression Mother   . Heart attack Father   . Breast cancer Sister     History  Substance Use Topics  . Smoking status: Former Smoker   Types: Cigarettes    Quit date: 12/23/1998  . Smokeless tobacco: Not on file  . Alcohol Use: No      Review of Systems  All other systems reviewed and are negative.    Allergies  Review of patient's allergies indicates no known allergies.  Home Medications   Current Outpatient Rx  Name  Route  Sig  Dispense  Refill  . acetaminophen-codeine (TYLENOL #3) 300-30 MG per tablet   Oral   Take 1-2 tablets by mouth every 6 (six) hours as needed. As needed for dental procedure.         . Ascorbic Acid (VITAMIN C) 250 MG tablet   Oral   Take 250 mg by mouth daily.           . benzonatate (TESSALON) 100 MG capsule   Oral   Take 1 capsule (100 mg total) by mouth 3 (three) times daily as needed for cough.   21 capsule   0   . carvedilol (COREG) 25 MG tablet   Oral   Take 1 tablet (25 mg total) by mouth 2 (two) times daily.   180 tablet   3   . doxycycline (VIBRA-TABS) 100 MG tablet   Oral   Take 100 mg by  mouth 2 (two) times daily.          Marland Kitchen eplerenone (INSPRA) 25 MG tablet   Oral   Take 1 tablet (25 mg total) by mouth daily.   90 tablet   3   . fenofibrate (TRICOR) 48 MG tablet   Oral   Take 48 mg by mouth daily.         . fenofibrate 160 MG tablet   Oral   Take 1 tablet (160 mg total) by mouth daily.   90 tablet   1   . folic acid (FOLVITE) 800 MCG tablet   Oral   Take 400 mcg by mouth daily.           . furosemide (LASIX) 40 MG tablet   Oral   Take 20 mg by mouth 2 (two) times daily. 1/2 tablet            . Guaifenesin 1200 MG TB12   Oral   Take 1 tablet (1,200 mg total) by mouth 2 (two) times daily at 10 AM and 5 PM.   14 each   0   . lisinopril (PRINIVIL,ZESTRIL) 10 MG tablet   Oral   Take 1 tablet (10 mg total) by mouth daily.   90 tablet   3   . LORazepam (ATIVAN) 1 MG tablet   Oral   Take 1 mg by mouth as needed.          . Multiple Vitamins-Minerals (MULTIVITAMIN WITH MINERALS) tablet   Oral   Take 1 tablet by mouth  daily.           . nitroGLYCERIN (NITROSTAT) 0.4 MG SL tablet   Sublingual   Place 1 tablet (0.4 mg total) under the tongue every 5 (five) minutes as needed.   25 tablet   6   . omeprazole (PRILOSEC) 40 MG capsule   Oral   Take 40 mg by mouth daily.           . potassium chloride SA (K-DUR,KLOR-CON) 20 MEQ tablet   Oral   Take 10 mEq by mouth daily.          . pravastatin (PRAVACHOL) 40 MG tablet      TAKE 1 TABLET AT BEDTIME   90 tablet   3   . sertraline (ZOLOFT) 100 MG tablet   Oral   Take 100 mg by mouth daily.           Marland Kitchen warfarin (COUMADIN) 5 MG tablet   Oral   Take 5 mg by mouth daily. UAD            BP 109/67  Pulse 110  Temp(Src) 98.6 F (37 C) (Oral)  Resp 18  SpO2 95%  Physical Exam  Nursing note and vitals reviewed. Constitutional: No distress.  Obese  HENT:  Head: Normocephalic and atraumatic.  Right Ear: External ear normal.  Left Ear: External ear normal.  Nasal congestion, nasal sounding voice  Eyes: Conjunctivae are normal. Right eye exhibits no discharge. Left eye exhibits no discharge. No scleral icterus.  Neck: Neck supple. No tracheal deviation present.  Cardiovascular: Normal rate and intact distal pulses.  An irregular rhythm present.  Pulmonary/Chest: Effort normal and breath sounds normal. No stridor. No respiratory distress. He has no wheezes. He has no rales.  Abdominal: Soft. Bowel sounds are normal. He exhibits no distension. There is no tenderness. There is no rebound and no guarding.  Musculoskeletal: He exhibits no edema and no tenderness.  Neurological: He is alert. He has normal strength. No sensory deficit. Cranial nerve deficit:  no gross defecits noted. He exhibits normal muscle tone. He displays no seizure activity. Coordination normal.  Skin: Skin is warm and dry. No rash noted.  Psychiatric: He has a normal mood and affect.    ED Course  Procedures (including critical care time) EKG  rate 106 Ventricular  paced with underlying atrial fibrillation Wide-complex QRS Normal ST-T wave No significant changes when compared to prior EKG   Labs Reviewed  CBC - Abnormal; Notable for the following:    WBC 14.2 (*)    Hemoglobin 12.3 (*)    HCT 37.3 (*)    All other components within normal limits  PRO B NATRIURETIC PEPTIDE - Abnormal; Notable for the following:    Pro B Natriuretic peptide (BNP) 4401.0 (*)    All other components within normal limits  COMPREHENSIVE METABOLIC PANEL - Abnormal; Notable for the following:    Glucose, Bld 105 (*)    BUN 33 (*)    Creatinine, Ser 2.00 (*)    Alkaline Phosphatase 35 (*)    GFR calc non Af Amer 33 (*)    GFR calc Af Amer 39 (*)    All other components within normal limits  D-DIMER, QUANTITATIVE   Dg Chest 2 View  02/26/2013  *RADIOLOGY REPORT*  Clinical Data: Cough.  Chills.  CHEST - 2 VIEW  Comparison: 02/15/2008  Findings: There is a right chest wall AICD with lead in the right atrial appendage, right ventricle, and coronary sinus.  The heart size appears normal.  No pleural effusion or edema.  No airspace consolidation.  The visualized osseous structures are unremarkable.  IMPRESSION:  1.  No acute cardiopulmonary abnormalities.   Original Report Authenticated By: Signa Kell, M.D.      1. Bronchitis       MDM  No sign of pna.  Low risk for pe with negative d dimer.  His symptoms are consistent with a URI, nasal congestion cough.  BNP is elevated but he does not have pulmonary edema or leg swelling.  I suspect this is more chronically associated with his ischemic cardiomyopathy and CHF.  Continue current medications.  Monitor for worsening symptoms.        Celene Kras, MD 02/26/13 [phone]

## 2013-02-26 NOTE — ED Notes (Signed)
Pt states he was sent from PCP office

## 2013-02-26 NOTE — ED Notes (Signed)
Patient was instructed on proper MDI technique with spacer. Patient demonstrated technique well and has no questions currently. Patient is in no apparent distress and RT will continue to monitor.

## 2013-03-18 ENCOUNTER — Ambulatory Visit (INDEPENDENT_AMBULATORY_CARE_PROVIDER_SITE_OTHER): Payer: 59 | Admitting: *Deleted

## 2013-03-18 ENCOUNTER — Encounter: Payer: Self-pay | Admitting: Internal Medicine

## 2013-03-18 ENCOUNTER — Other Ambulatory Visit: Payer: Self-pay

## 2013-03-18 DIAGNOSIS — I4891 Unspecified atrial fibrillation: Secondary | ICD-10-CM

## 2013-03-18 DIAGNOSIS — I509 Heart failure, unspecified: Secondary | ICD-10-CM

## 2013-03-18 DIAGNOSIS — I428 Other cardiomyopathies: Secondary | ICD-10-CM

## 2013-03-18 LAB — ICD DEVICE OBSERVATION
CHARGE TIME: 13.012 s
FVT: 0
LV LEAD IMPEDENCE ICD: 760 Ohm
LV LEAD THRESHOLD: 1 V
RV LEAD IMPEDENCE ICD: 408 Ohm
RV LEAD THRESHOLD: 1 V
TOT-0001: 1
TOT-0002: 0
TZAT-0001ATACH: 1
TZAT-0002ATACH: NEGATIVE
TZAT-0002ATACH: NEGATIVE
TZAT-0005SLOWVT: 88 pct
TZAT-0005SLOWVT: 91 pct
TZAT-0011SLOWVT: 10 ms
TZAT-0011SLOWVT: 10 ms
TZAT-0012ATACH: 150 ms
TZAT-0012SLOWVT: 200 ms
TZAT-0012SLOWVT: 200 ms
TZAT-0013SLOWVT: 2
TZAT-0018ATACH: NEGATIVE
TZAT-0018SLOWVT: NEGATIVE
TZAT-0018SLOWVT: NEGATIVE
TZAT-0019ATACH: 6 V
TZAT-0019ATACH: 6 V
TZAT-0019FASTVT: 8 V
TZAT-0020ATACH: 1.5 ms
TZAT-0020FASTVT: 1.5 ms
TZON-0003SLOWVT: 340 ms
TZON-0004SLOWVT: 32
TZON-0005SLOWVT: 12
TZST-0001ATACH: 5
TZST-0001FASTVT: 2
TZST-0001FASTVT: 3
TZST-0001SLOWVT: 4
TZST-0001SLOWVT: 6
TZST-0002ATACH: NEGATIVE
TZST-0002FASTVT: NEGATIVE
TZST-0002FASTVT: NEGATIVE
TZST-0003SLOWVT: 35 J

## 2013-03-18 NOTE — Progress Notes (Signed)
Dual chamber ICD check in clinic. Normal device function. Pt in AF 100% of time. + Warfarin. Battery voltage 2.66 V---pt aware of alert tone. Changed VF NID to 30/40 and VT NID to 32. ROV 06-14-13 @ 1015 with SK.

## 2013-03-19 ENCOUNTER — Other Ambulatory Visit: Payer: Self-pay | Admitting: Cardiology

## 2013-03-23 ENCOUNTER — Encounter (HOSPITAL_COMMUNITY): Payer: Self-pay | Admitting: *Deleted

## 2013-03-23 ENCOUNTER — Emergency Department (HOSPITAL_COMMUNITY)
Admission: EM | Admit: 2013-03-23 | Discharge: 2013-03-23 | Disposition: A | Discharge status: Home / Self Care | Payer: 59 | Attending: Emergency Medicine | Admitting: Emergency Medicine

## 2013-03-23 ENCOUNTER — Emergency Department (HOSPITAL_COMMUNITY): Payer: 59

## 2013-03-23 DIAGNOSIS — E785 Hyperlipidemia, unspecified: Secondary | ICD-10-CM | POA: Insufficient documentation

## 2013-03-23 DIAGNOSIS — Y9389 Activity, other specified: Secondary | ICD-10-CM | POA: Insufficient documentation

## 2013-03-23 DIAGNOSIS — Z8679 Personal history of other diseases of the circulatory system: Secondary | ICD-10-CM | POA: Insufficient documentation

## 2013-03-23 DIAGNOSIS — Z87891 Personal history of nicotine dependence: Secondary | ICD-10-CM | POA: Insufficient documentation

## 2013-03-23 DIAGNOSIS — S51011A Laceration without foreign body of right elbow, initial encounter: Secondary | ICD-10-CM

## 2013-03-23 DIAGNOSIS — S51009A Unspecified open wound of unspecified elbow, initial encounter: Secondary | ICD-10-CM | POA: Insufficient documentation

## 2013-03-23 DIAGNOSIS — F3289 Other specified depressive episodes: Secondary | ICD-10-CM | POA: Insufficient documentation

## 2013-03-23 DIAGNOSIS — Z79899 Other long term (current) drug therapy: Secondary | ICD-10-CM | POA: Insufficient documentation

## 2013-03-23 DIAGNOSIS — W1789XA Other fall from one level to another, initial encounter: Secondary | ICD-10-CM | POA: Insufficient documentation

## 2013-03-23 DIAGNOSIS — Z87448 Personal history of other diseases of urinary system: Secondary | ICD-10-CM | POA: Insufficient documentation

## 2013-03-23 DIAGNOSIS — I252 Old myocardial infarction: Secondary | ICD-10-CM | POA: Insufficient documentation

## 2013-03-23 DIAGNOSIS — W19XXXA Unspecified fall, initial encounter: Secondary | ICD-10-CM

## 2013-03-23 DIAGNOSIS — Z23 Encounter for immunization: Secondary | ICD-10-CM | POA: Insufficient documentation

## 2013-03-23 DIAGNOSIS — I2510 Atherosclerotic heart disease of native coronary artery without angina pectoris: Secondary | ICD-10-CM | POA: Insufficient documentation

## 2013-03-23 DIAGNOSIS — N189 Chronic kidney disease, unspecified: Secondary | ICD-10-CM | POA: Insufficient documentation

## 2013-03-23 DIAGNOSIS — I4891 Unspecified atrial fibrillation: Secondary | ICD-10-CM | POA: Insufficient documentation

## 2013-03-23 DIAGNOSIS — F329 Major depressive disorder, single episode, unspecified: Secondary | ICD-10-CM | POA: Insufficient documentation

## 2013-03-23 DIAGNOSIS — Y92009 Unspecified place in unspecified non-institutional (private) residence as the place of occurrence of the external cause: Secondary | ICD-10-CM | POA: Insufficient documentation

## 2013-03-23 DIAGNOSIS — Z7901 Long term (current) use of anticoagulants: Secondary | ICD-10-CM | POA: Insufficient documentation

## 2013-03-23 LAB — PROTIME-INR: Prothrombin Time: 28.3 seconds — ABNORMAL HIGH (ref 11.6–15.2)

## 2013-03-23 MED ORDER — TETANUS-DIPHTH-ACELL PERTUSSIS 5-2.5-18.5 LF-MCG/0.5 IM SUSP
0.5000 mL | Freq: Once | INTRAMUSCULAR | Status: AC
  Administered 2013-03-23: 0.5 mL via INTRAMUSCULAR
  Filled 2013-03-23: qty 0.5

## 2013-03-23 NOTE — ED Provider Notes (Signed)
History     CSN: [id number]  Arrival date & time 03/23/13  1514   First MD Initiated Contact with Patient 03/23/13 1516      Chief Complaint  Patient presents with  . Fall  . Extremity Laceration    (Consider location/radiation/quality/duration/timing/severity/associated sxs/prior treatment) Patient is a 67 y.o. male presenting with fall.  Fall The accident occurred less than 1 hour ago. Fall occurred: while working in the yard. Distance fallen: 2 feet. Impact surface: wheelbarrow. The volume of blood lost was moderate. Point of impact: right elbow. Pain location: r elbow. The pain is mild. He was ambulatory at the scene. Pertinent negatives include no numbness, no vomiting, no hematuria, no loss of consciousness and no tingling. The symptoms are aggravated by use of the injured limb. He has tried nothing for the symptoms.    Past Medical History  Diagnosis Date  . Coronary artery disease   . MI (myocardial infarction)     ANTERIOR  . Ischemic cardiomyopathy     EF 23%  . PAF (paroxysmal atrial fibrillation)   . Chronic renal insufficiency   . Dyslipidemia   . Depression   . ED (erectile dysfunction)   . Other primary cardiomyopathies     Past Surgical History  Procedure Laterality Date  . Cardiac catheterization  02/05/2008    MODERATE TO SEVERE LEFT VENTRICULAR  DYSFUNCTION WITH EF 25-30%  . Coronary stent placement      LAD  . Transthoracic echocardiogram  12/2010    EF 30-35%  . Cardiac defibrillator placement      BI-VENTRICULAR ICD  . Wrist fracture surgery    . Breast lumpectomy    . Cardiac defibrillator placement      Family History  Problem Relation Age of Onset  . Breast cancer Mother   . Depression Mother   . Heart attack Father   . Breast cancer Sister     History  Substance Use Topics  . Smoking status: Former Smoker    Types: Cigarettes    Quit date: 12/23/1998  . Smokeless tobacco: Not on file  . Alcohol Use: No      Review of Systems   Gastrointestinal: Negative for vomiting.  Genitourinary: Negative for hematuria.  Neurological: Positive for dizziness. Negative for tingling, loss of consciousness and numbness.  All other systems reviewed and are negative.    Allergies  Review of patient's allergies indicates no known allergies.  Home Medications   Current Outpatient Rx  Name  Route  Sig  Dispense  Refill  . acetaminophen-codeine (TYLENOL #3) 300-30 MG per tablet   Oral   Take 1-2 tablets by mouth every 6 (six) hours as needed. As needed for dental procedure.         . Ascorbic Acid (VITAMIN C) 250 MG tablet   Oral   Take 250 mg by mouth daily.           . benzonatate (TESSALON) 100 MG capsule   Oral   Take 1 capsule (100 mg total) by mouth 3 (three) times daily as needed for cough.   21 capsule   0   . carvedilol (COREG) 25 MG tablet   Oral   Take 1 tablet (25 mg total) by mouth 2 (two) times daily.   180 tablet   3   . eplerenone (INSPRA) 25 MG tablet   Oral   Take 1 tablet (25 mg total) by mouth daily.   90 tablet   3   .  fenofibrate 160 MG tablet      Take 1 tablet by mouth  daily   90 tablet   1   . folic acid (FOLVITE) 800 MCG tablet   Oral   Take 400 mcg by mouth daily.           . furosemide (LASIX) 40 MG tablet   Oral   Take 20 mg by mouth 2 (two) times daily. 1/2 tablet            . lisinopril (PRINIVIL,ZESTRIL) 10 MG tablet   Oral   Take 1 tablet (10 mg total) by mouth daily.   90 tablet   3   . LORazepam (ATIVAN) 1 MG tablet   Oral   Take 1 mg by mouth as needed.          . Multiple Vitamins-Minerals (MULTIVITAMIN WITH MINERALS) tablet   Oral   Take 1 tablet by mouth daily.           . nitroGLYCERIN (NITROSTAT) 0.4 MG SL tablet   Sublingual   Place 1 tablet (0.4 mg total) under the tongue every 5 (five) minutes as needed.   25 tablet   6   . omeprazole (PRILOSEC) 40 MG capsule   Oral   Take 40 mg by mouth daily.           . potassium chloride  SA (K-DUR,KLOR-CON) 20 MEQ tablet   Oral   Take 10 mEq by mouth daily.          . pravastatin (PRAVACHOL) 40 MG tablet      TAKE 1 TABLET AT BEDTIME   90 tablet   3   . sertraline (ZOLOFT) 100 MG tablet   Oral   Take 100 mg by mouth daily.           Marland Kitchen warfarin (COUMADIN) 5 MG tablet   Oral   Take 5 mg by mouth daily. UAD            BP 106/50  Pulse 80  Temp(Src) 98.6 F (37 C) (Oral)  Ht 5\' 10"  (1.778 m)  Wt 255 lb (115.667 kg)  BMI 36.59 kg/m2  SpO2 98%  Physical Exam  Nursing note and vitals reviewed. Constitutional: He is oriented to person, place, and time. He appears well-developed and well-nourished. No distress.  HENT:  Head: Normocephalic and atraumatic.  Mouth/Throat: Oropharynx is clear and moist.  Eyes: Conjunctivae are normal. Pupils are equal, round, and reactive to light. No scleral icterus.  Neck: Normal range of motion. Neck supple. No spinous process tenderness and no muscular tenderness present. Normal range of motion present.  Cardiovascular: Normal rate, regular rhythm, normal heart sounds and intact distal pulses.   No murmur heard. Pulmonary/Chest: Effort normal and breath sounds normal. No stridor. No respiratory distress. He has no wheezes. He has no rales.  Abdominal: Soft. He exhibits no distension. There is no tenderness.  Musculoskeletal: Normal range of motion. He exhibits no edema.       Right elbow: He exhibits laceration (large laceration over posterior distal elbow.  Tendon visible but not damaged.  NV intact distally.  Tendon function normal.  Full ROM of elbow.  ).       Thoracic back: He exhibits no bony tenderness.       Lumbar back: He exhibits no bony tenderness.  Neurological: He is alert and oriented to person, place, and time.  Skin: Skin is warm and dry. No rash noted.  Psychiatric: He has  a normal mood and affect. His behavior is normal.    ED Course  LACERATION REPAIR Date/Time: 03/23/2013 5:11 PM Performed by:  Rennis Petty Authorized by: Eber Hong D Consent: Verbal consent obtained. Risks and benefits: risks, benefits and alternatives were discussed Body area: upper extremity Location details: right elbow Laceration length: 5 cm Foreign bodies: no foreign bodies Tendon involvement: none Nerve involvement: none Vascular damage: no Local anesthetic: lidocaine 2% with epinephrine Preparation: Patient was prepped and draped in the usual sterile fashion. Irrigation solution: saline Irrigation method: jet lavage Amount of cleaning: extensive Skin closure: 3-0 Prolene Mucous membrane closure: 3-0 Chromic gut Number of sutures: 2 deep, 10 skin. Technique: simple Approximation: close Approximation difficulty: complex Dressing: antibiotic ointment Patient tolerance: Patient tolerated the procedure well with no immediate complications.   (including critical care time)  Labs Reviewed  PROTIME-INR - Abnormal; Notable for the following:    Prothrombin Time 28.3 (*)    INR 2.83 (*)    All other components within normal limits   Dg Elbow Complete Right  03/23/2013  *RADIOLOGY REPORT*  Clinical Data: Fall.  Extremity laceration.  RIGHT ELBOW - COMPLETE 3+ VIEW  Comparison: None  Findings: Large soft tissue defect is identified along the dorsal the aspect of the elbow. No radiopaque foreign bodies are identified.  No fracture or subluxation noted.  IMPRESSION: Soft tissue laceration.  No acute bony abnormality.   Original Report Authenticated By: Signa Kell, M.D.   All radiology studies independently viewed by me.      Date: 03/23/2013  Rate: 90  Rhythm: atrial fibrillation  QRS Axis: left  Intervals: QT prolonged  ST/T Wave abnormalities: nonspecific T wave changes  Conduction Disutrbances:left posterior fascicular block  Narrative Interpretation: a fib with occasional paced beats.   Old EKG Reviewed: changes noted   1. Fall, initial encounter   2. Elbow laceration, right,  initial encounter       MDM   67 yo male on coumadin for A fib presenting after a fall during which he sustained a large right elbow laceration.  No other injuries based on history or exam.  Did not hit head, did not lose consciousness.  He stated he stood up too fast and got dizzy, causing the fall.  Did not syncopize.  He states this happens from time to time when he stands up too fast.  Xray of elbow pending, plan to suture lac.    Lac repaired without complication.      Rennis Petty, MD 03/23/13 [phone]

## 2013-03-23 NOTE — ED Notes (Addendum)
Fell approx 2 feet landing on a wheel barrow. Large lac to right elbow, bleeding presently controlled by drsg applied by EMS, no obvious deformity. Denies LOC, or other injuries. Given fentanyl 150 mcg enroute

## 2013-03-23 NOTE — Progress Notes (Signed)
Orthopedic Tech Progress Note Patient Details:  [name] 07/04/1946 [id number]  Ortho Devices Type of Ortho Device: Arm sling Ortho Device/Splint Location: (R) UE Ortho Device/Splint Interventions: Ordered;Application   Jennye Moccasin 03/23/2013, 6:02 PM

## 2013-03-24 NOTE — ED Provider Notes (Signed)
I saw and evaluated the patient, reviewed the resident's note and I agree with the findings and plan.  Pertinent History: Pt with large laceration onset prior to arrival over the R prox forearm / elbow Pertinent Exam findings: large laceration, RUE, NV intact, bleeding controlled  I was personally present and directly supervised the following procedures:  Laceration repair of R elbow   I personally interpreted the EKG as well as the resident and agree with the interpretation on the resident's chart.   Vida Roller, MD 03/24/13 [phone]

## 2013-04-22 ENCOUNTER — Other Ambulatory Visit: Payer: Self-pay | Admitting: *Deleted

## 2013-04-22 DIAGNOSIS — I428 Other cardiomyopathies: Secondary | ICD-10-CM

## 2013-04-22 MED ORDER — PRAVASTATIN SODIUM 40 MG PO TABS: ORAL_TABLET | ORAL | Status: DC

## 2013-04-22 MED ORDER — FUROSEMIDE 40 MG PO TABS: 20.0000 mg | ORAL_TABLET | Freq: Two times a day (BID) | ORAL | Status: DC

## 2013-04-25 ENCOUNTER — Telehealth: Payer: Self-pay

## 2013-04-25 NOTE — Telephone Encounter (Signed)
Got another refill request from [facility] for Furosemide and Pravastatin. Called [facility] and explained they were refilled already on 04/22/13 90 tabs with 1 refill remaining for each. [facility] stated they never got the refills so I approved them over the phone.

## 2013-05-14 ENCOUNTER — Other Ambulatory Visit: Payer: Self-pay

## 2013-05-14 MED ORDER — POTASSIUM CHLORIDE CRYS ER 20 MEQ PO TBCR: 10.0000 meq | EXTENDED_RELEASE_TABLET | Freq: Every day | ORAL | Status: DC

## 2013-05-14 NOTE — Telephone Encounter (Signed)
potassium chloride SA (K-DUR,KLOR-CON) 20 MEQ tablet  Take 10 mEq by mouth daily.  Patient Instructions  Continue your current medication except we will double your dose of carvedilol. Let me know what size tablet you have. I will see you again in 6 months Patient Instructions History Recorded  Chart Reviewed By  Charna Elizabeth, LPN  on 12/24/1094  3:51 PM  Previous Visit  Provider Department Encounter #  12/17/2012  9:30 AM Peter [place], MD [facility] [place] [id number]

## 2013-05-21 ENCOUNTER — Telehealth: Payer: Self-pay | Admitting: Cardiology

## 2013-05-21 DIAGNOSIS — E785 Hyperlipidemia, unspecified: Secondary | ICD-10-CM

## 2013-05-21 NOTE — Telephone Encounter (Signed)
New Prob     Pt called in requesting some bloodwork. Scheduled for 6/23. Orders needed in EPIC.

## 2013-05-21 NOTE — Telephone Encounter (Signed)
Returned call to patient he stated he needed fasting lab work before he sees Dr.Jordan in July.Fasting lab scheduled 06/10/13.

## 2013-06-10 ENCOUNTER — Other Ambulatory Visit (INDEPENDENT_AMBULATORY_CARE_PROVIDER_SITE_OTHER): Payer: 59

## 2013-06-10 DIAGNOSIS — E785 Hyperlipidemia, unspecified: Secondary | ICD-10-CM

## 2013-06-10 LAB — LIPID PANEL
Cholesterol: 156 mg/dL (ref 0–200)
HDL: 42.4 mg/dL (ref >39.00)
LDL Cholesterol: 90 mg/dL (ref 0–99)
VLDL: 23.2 mg/dL (ref 0.0–40.0)

## 2013-06-10 LAB — HEPATIC FUNCTION PANEL
ALT: 22 U/L (ref 0–53)
AST: 24 U/L (ref 0–37)
Albumin: 4 g/dL (ref 3.5–5.2)
Alkaline Phosphatase: 27 U/L — ABNORMAL LOW (ref 39–117)
Bilirubin, Direct: 0.1 mg/dL (ref 0.0–0.3)
Total Bilirubin: 0.8 mg/dL (ref 0.3–1.2)
Total Protein: 7 g/dL (ref 6.0–8.3)

## 2013-06-10 LAB — BASIC METABOLIC PANEL
BUN: 39 mg/dL — ABNORMAL HIGH (ref 6–23)
CO2: 27 mEq/L (ref 19–32)
Calcium: 9.4 mg/dL (ref 8.4–10.5)
Chloride: 106 mEq/L (ref 96–112)
Creatinine, Ser: 1.8 mg/dL — ABNORMAL HIGH (ref 0.4–1.5)
GFR: 40.91 mL/min — ABNORMAL LOW (ref >60.00)
Glucose, Bld: 90 mg/dL (ref 70–99)
Potassium: 3.9 mEq/L (ref 3.5–5.1)
Sodium: 140 mEq/L (ref 135–145)

## 2013-06-14 ENCOUNTER — Ambulatory Visit (INDEPENDENT_AMBULATORY_CARE_PROVIDER_SITE_OTHER): Payer: 59 | Admitting: Internal Medicine

## 2013-06-14 ENCOUNTER — Encounter: Payer: Self-pay | Admitting: Internal Medicine

## 2013-06-14 ENCOUNTER — Encounter: Payer: Self-pay | Admitting: *Deleted

## 2013-06-14 VITALS — BP 108/62 | HR 85 | Ht 70.0 in | Wt 229.5 lb

## 2013-06-14 DIAGNOSIS — I509 Heart failure, unspecified: Secondary | ICD-10-CM

## 2013-06-14 DIAGNOSIS — I4891 Unspecified atrial fibrillation: Secondary | ICD-10-CM

## 2013-06-14 DIAGNOSIS — I428 Other cardiomyopathies: Secondary | ICD-10-CM

## 2013-06-14 LAB — ICD DEVICE OBSERVATION
AL AMPLITUDE: 1.2 mv
BATTERY VOLTAGE: 2.64 V
LV LEAD IMPEDENCE ICD: 760 Ohm
LV LEAD THRESHOLD: 1 V
RV LEAD AMPLITUDE: 9 mv
RV LEAD IMPEDENCE ICD: 400 Ohm
RV LEAD THRESHOLD: 1 V
TOT-0002: 0
TOT-0006: 20090226000000
TZAT-0001ATACH: 2
TZAT-0001SLOWVT: 1
TZAT-0001SLOWVT: 2
TZAT-0002ATACH: NEGATIVE
TZAT-0002ATACH: NEGATIVE
TZAT-0002ATACH: NEGATIVE
TZAT-0002FASTVT: NEGATIVE
TZAT-0005SLOWVT: 88 pct
TZAT-0011SLOWVT: 10 ms
TZAT-0011SLOWVT: 10 ms
TZAT-0012ATACH: 150 ms
TZAT-0012FASTVT: 200 ms
TZAT-0018ATACH: NEGATIVE
TZAT-0018FASTVT: NEGATIVE
TZAT-0018SLOWVT: NEGATIVE
TZAT-0018SLOWVT: NEGATIVE
TZAT-0019ATACH: 6 V
TZAT-0019ATACH: 6 V
TZAT-0019ATACH: 6 V
TZAT-0019FASTVT: 8 V
TZAT-0019SLOWVT: 8 V
TZAT-0019SLOWVT: 8 V
TZAT-0020ATACH: 1.5 ms
TZAT-0020FASTVT: 1.5 ms
TZON-0003VSLOWVT: 400 ms
TZON-0004VSLOWVT: 36
TZON-0005SLOWVT: 12
TZST-0001ATACH: 5
TZST-0001FASTVT: 5
TZST-0001SLOWVT: 3
TZST-0001SLOWVT: 5
TZST-0002ATACH: NEGATIVE
TZST-0002FASTVT: NEGATIVE
TZST-0002FASTVT: NEGATIVE
TZST-0002FASTVT: NEGATIVE
TZST-0003SLOWVT: 20 J
TZST-0003SLOWVT: 35 J
VENTRICULAR PACING ICD: 62.4 pct
VF: 0

## 2013-06-14 MED ORDER — CARVEDILOL 12.5 MG PO TABS: 12.5000 mg | ORAL_TABLET | Freq: Two times a day (BID) | ORAL | Status: DC

## 2013-06-14 MED ORDER — DIGOXIN 125 MCG PO TABS: 0.1250 mg | ORAL_TABLET | Freq: Every day | ORAL | Status: DC

## 2013-06-14 NOTE — Patient Instructions (Addendum)
Your physician has requested that you have an echocardiogram. Echocardiography is a painless test that uses sound waves to create images of your heart. It provides your doctor with information about the size and shape of your heart and how well your heart's chambers and valves are working. This procedure takes approximately one hour. There are no restrictions for this procedure.  MEDICATION CHANGES:  Decrease Carvedilol to 12.5mg  twice a day                                                       Start:     Digoxin 0.125mg  daily    You will need lab work in the next 10 days: Digoxin level   Same day as your echo if possible.  Your physician recommends that you schedule a follow-up appointment in: 3 months with the device clinic  Your physician wants you to follow-up in: 1 year with [name]. You will receive a reminder letter in the mail two months in advance. If you don't receive a letter, please call our office to schedule the follow-up appointment.

## 2013-06-14 NOTE — Assessment & Plan Note (Addendum)
His atrial fibrillation is now persistent. Heart rates are modestly elevated although they improved with the up titration of his carvedilol. This was unfortunately however associated with dropping blood pressure. Hence we will decrease his carvedilol to 25--12.5 and use digoxin 0.125 mg to see if not augment rate control without affecting his blood pressure adversely.  He has not previously tolerated amiodarone. He is QT interval is too long for dofetilide. We'll undertake an echo to look at left atrial dimension to assess question as to whether he might be a candidate for catheter ablation. It is not clear however that he has significant symptoms associated with the change to persistent atrial fibrillation identified at the end of the fall.  i spent > 40 min reviewing th above

## 2013-06-14 NOTE — Progress Notes (Signed)
f Patient Care Team: Delorse Lek, MD as PCP - General (Family Medicine)   HPI  [name] is a 67 y.o. male seen in followup for congestive heart failure in the setting of a mixed cardiomyopathy. He is status post CRT-D for the above.   He also has atrial fibrillation previously and was on amio .  He has been having more atrial fibrillation of late with rapid rates and Dr. Garth Bigness increased his carvedilol at his last visit January 2014 He is doing pretty well without worseing of dyspnea and edema is actually working on losing weight and there has been no significant edema.  His exercise tolerance remains unchanged.  He has had problems with low blood pressure was measured numbers in the 80s and 90s as well as lightheadedness with standing and bending of his head.   Past Medical History  Diagnosis Date  . Coronary artery disease   . MI (myocardial infarction)     ANTERIOR  . Ischemic cardiomyopathy     EF 23%  . PAF (paroxysmal atrial fibrillation)   . Chronic renal insufficiency   . Dyslipidemia   . Depression   . ED (erectile dysfunction)   . Other primary cardiomyopathies     Past Surgical History  Procedure Laterality Date  . Cardiac catheterization  02/05/2008    MODERATE TO SEVERE LEFT VENTRICULAR  DYSFUNCTION WITH EF 25-30%  . Coronary stent placement      LAD  . Transthoracic echocardiogram  12/2010    EF 30-35%  . Cardiac defibrillator placement      BI-VENTRICULAR ICD  . Wrist fracture surgery    . Breast lumpectomy    . Cardiac defibrillator placement      Current Outpatient Prescriptions  Medication Sig Dispense Refill  . Ascorbic Acid (VITAMIN C) 250 MG tablet Take 250 mg by mouth daily.        . benzonatate (TESSALON) 100 MG capsule Take 1 capsule (100 mg total) by mouth 3 (three) times daily as needed for cough.  21 capsule  0  . carvedilol (COREG) 25 MG tablet Take 1 tablet (25 mg total) by mouth 2 (two) times daily.  180 tablet  3  . eplerenone  (INSPRA) 25 MG tablet Take 1 tablet (25 mg total) by mouth daily.  90 tablet  3  . fenofibrate 160 MG tablet Take 1 tablet by mouth  daily  90 tablet  1  . folic acid (FOLVITE) 800 MCG tablet Take 800 mcg by mouth daily.       . furosemide (LASIX) 40 MG tablet Take 0.5 tablets (20 mg total) by mouth 2 (two) times daily. 1/2 tablet at breakfast and 1/2 at lunch  90 tablet  1  . lisinopril (PRINIVIL,ZESTRIL) 10 MG tablet Take 1 tablet (10 mg total) by mouth daily.  90 tablet  3  . LORazepam (ATIVAN) 1 MG tablet Take 1 mg by mouth as needed for anxiety.       . Multiple Vitamins-Minerals (MULTIVITAMIN WITH MINERALS) tablet Take 1 tablet by mouth daily.        . nitroGLYCERIN (NITROSTAT) 0.4 MG SL tablet Place 1 tablet (0.4 mg total) under the tongue every 5 (five) minutes as needed.  25 tablet  6  . potassium chloride SA (K-DUR,KLOR-CON) 20 MEQ tablet Take 0.5 tablets (10 mEq total) by mouth daily.  90 tablet  1  . pravastatin (PRAVACHOL) 40 MG tablet TAKE 1 TABLET AT BEDTIME  90 tablet  1  .  sertraline (ZOLOFT) 100 MG tablet Take 100 mg by mouth daily.        Marland Kitchen warfarin (COUMADIN) 5 MG tablet Take 2.5-5 mg by mouth daily. 5mg  on Thursday and 2.5mg  rest of week       No current facility-administered medications for this visit.    No Known Allergies  Review of Systems negative except from HPI and PMH  Physical Exam BP 108/62  Pulse 85  Ht 5\' 10"  (1.778 m)  Wt 229 lb 8 oz (104.101 kg)  BMI 32.93 kg/m2 Well developed and nourished in no acute distress HENT normal Neck supple with JVP-flat Carotids brisk and full without bruits Clear Regular rate and rhythm, no murmurs or gallops Abd-soft with active BS without hepatomegaly No Clubbing cyanosis edema Skin-warm and dry A & Oriented  Grossly normal sensory and motor function  ECG today demonstrates atrial fibrillation with intermittent ventricular pacing QTC is 500+ milliseconds with a JtC of greater than 350 milliseconds   Assessment  and  Plan

## 2013-06-14 NOTE — Assessment & Plan Note (Signed)
The patient's device was interrogated and the information was fully reviewed.  The device was reprogrammed to DD I. Prevent tracking of his atrial fibrillation

## 2013-06-14 NOTE — Assessment & Plan Note (Signed)
Blood pressures low. We will adjust as noted above.

## 2013-06-14 NOTE — Addendum Note (Signed)
Addended by: Lisabeth Devoid F on: 06/14/2013 01:05 PM   Modules accepted: Orders

## 2013-06-14 NOTE — Assessment & Plan Note (Signed)
Without symptoms of angina. Continue current medications

## 2013-06-24 ENCOUNTER — Other Ambulatory Visit: Payer: 59

## 2013-06-24 ENCOUNTER — Ambulatory Visit (HOSPITAL_COMMUNITY): Payer: 59 | Attending: Cardiology | Admitting: Radiology

## 2013-06-24 ENCOUNTER — Other Ambulatory Visit (HOSPITAL_COMMUNITY): Payer: Self-pay | Admitting: Cardiology

## 2013-06-24 DIAGNOSIS — I4891 Unspecified atrial fibrillation: Secondary | ICD-10-CM

## 2013-06-24 DIAGNOSIS — I509 Heart failure, unspecified: Secondary | ICD-10-CM

## 2013-06-24 DIAGNOSIS — I428 Other cardiomyopathies: Secondary | ICD-10-CM

## 2013-06-24 DIAGNOSIS — I2589 Other forms of chronic ischemic heart disease: Secondary | ICD-10-CM

## 2013-06-24 DIAGNOSIS — I2510 Atherosclerotic heart disease of native coronary artery without angina pectoris: Secondary | ICD-10-CM

## 2013-06-24 MED ORDER — PERFLUTREN PROTEIN A MICROSPH IV SUSP
3.0000 mL | Freq: Once | INTRAVENOUS | Status: AC
  Administered 2013-06-24: 3 mL via INTRAVENOUS

## 2013-06-24 NOTE — Progress Notes (Signed)
Echocardiogram performed with [employer].  

## 2013-06-25 LAB — DIGOXIN LEVEL: Digoxin Level: 1.2 ng/mL (ref 0.8–2.0)

## 2013-06-28 ENCOUNTER — Ambulatory Visit (INDEPENDENT_AMBULATORY_CARE_PROVIDER_SITE_OTHER): Payer: 59 | Admitting: Cardiology

## 2013-06-28 ENCOUNTER — Encounter: Payer: Self-pay | Admitting: Cardiology

## 2013-06-28 VITALS — BP 90/60 | HR 90 | Ht 70.0 in | Wt 224.2 lb

## 2013-06-28 DIAGNOSIS — I5022 Chronic systolic (congestive) heart failure: Secondary | ICD-10-CM

## 2013-06-28 DIAGNOSIS — Z95810 Presence of automatic (implantable) cardiac defibrillator: Secondary | ICD-10-CM

## 2013-06-28 DIAGNOSIS — I4891 Unspecified atrial fibrillation: Secondary | ICD-10-CM

## 2013-06-28 DIAGNOSIS — N259 Disorder resulting from impaired renal tubular function, unspecified: Secondary | ICD-10-CM

## 2013-06-28 DIAGNOSIS — I509 Heart failure, unspecified: Secondary | ICD-10-CM

## 2013-06-28 DIAGNOSIS — I2510 Atherosclerotic heart disease of native coronary artery without angina pectoris: Secondary | ICD-10-CM

## 2013-06-28 NOTE — Patient Instructions (Addendum)
Reduce Lasix to 20 mg once a day.  Reduce lisinopril to 5 mg daily.  Continue your other medication.  I will discuss with [name] options for ablation.  I will see you in 3 months.

## 2013-06-28 NOTE — Progress Notes (Signed)
[name] Date of Birth: 1946/01/18 Medical Record #[id number]  History of Present Illness: [name] is seen today for followup.  He has a history of remote anterior myocardial infarction with an ischemic cardiomyopathy and ejection fraction of 23%. He is status post CRT D. He also has a history of atrial fibrillation. He is intolerant of amiodarone. His QT interval is too long for Tikosyn. He previously had attempted to increase his carvedilol dose but he developed significant hypotension. When seen last month by [name] his dose was reduced and he was started on digoxin. At that time he was noted to be in persistent atrial fibrillation with ventricular rates of 100-140 beats per minute. He still has intermittent lightheadedness. He has joined [employer] and has lost 36 pounds. He states that his stamina is the same. He denies any edema. He has had no chest pain or significant shortness of breath.  Current Outpatient Prescriptions on File Prior to Visit  Medication Sig Dispense Refill  . Ascorbic Acid (VITAMIN C) 250 MG tablet Take 250 mg by mouth daily.        . carvedilol (COREG) 12.5 MG tablet Take 1 tablet (12.5 mg total) by mouth 2 (two) times daily.  180 tablet  3  . digoxin (LANOXIN) 0.125 MG tablet Take 1 tablet (0.125 mg total) by mouth daily.  30 tablet  3  . eplerenone (INSPRA) 25 MG tablet Take 1 tablet (25 mg total) by mouth daily.  90 tablet  3  . fenofibrate 160 MG tablet Take 1 tablet by mouth  daily  90 tablet  1  . folic acid (FOLVITE) 800 MCG tablet Take 800 mcg by mouth daily.       . furosemide (LASIX) 40 MG tablet Take 0.5 tablets (20 mg total) by mouth 2 (two) times daily. 1/2 tablet at breakfast and 1/2 at lunch  90 tablet  1  . lisinopril (PRINIVIL,ZESTRIL) 10 MG tablet Take 1 tablet (10 mg total) by mouth daily.  90 tablet  3  . LORazepam (ATIVAN) 1 MG tablet Take 1 mg by mouth as needed for anxiety.       . Multiple Vitamins-Minerals (MULTIVITAMIN WITH  MINERALS) tablet Take 1 tablet by mouth daily.        . nitroGLYCERIN (NITROSTAT) 0.4 MG SL tablet Place 1 tablet (0.4 mg total) under the tongue every 5 (five) minutes as needed.  25 tablet  6  . potassium chloride SA (K-DUR,KLOR-CON) 20 MEQ tablet Take 0.5 tablets (10 mEq total) by mouth daily.  90 tablet  1  . pravastatin (PRAVACHOL) 40 MG tablet TAKE 1 TABLET AT BEDTIME  90 tablet  1  . sertraline (ZOLOFT) 100 MG tablet Take 100 mg by mouth daily.        Marland Kitchen warfarin (COUMADIN) 5 MG tablet Take 2.5-5 mg by mouth daily. 5mg  on Thursday and 2.5mg  rest of week       No current facility-administered medications on file prior to visit.    No Known Allergies  Past Medical History  Diagnosis Date  . Coronary artery disease   . MI (myocardial infarction)     ANTERIOR  . Ischemic cardiomyopathy     EF 23%  . PAF (paroxysmal atrial fibrillation)   . Chronic renal insufficiency   . Dyslipidemia   . Depression   . ED (erectile dysfunction)   . Other primary cardiomyopathies     Past Surgical History  Procedure Laterality Date  . Cardiac catheterization  02/05/2008    MODERATE TO SEVERE LEFT VENTRICULAR  DYSFUNCTION WITH EF 25-30%  . Coronary stent placement      LAD  . Transthoracic echocardiogram  12/2010    EF 30-35%  . Cardiac defibrillator placement      BI-VENTRICULAR ICD  . Wrist fracture surgery    . Breast lumpectomy    . Cardiac defibrillator placement      History  Smoking status  . Former Smoker  . Types: Cigarettes  . Quit date: 12/23/1998  Smokeless tobacco  . Not on file    History  Alcohol Use No    Family History  Problem Relation Age of Onset  . Breast cancer Mother   . Depression Mother   . Heart attack Father   . Breast cancer Sister     Review of Systems: The review of systems is positive for a recent fall in his backyard resulting in a laceration of his elbow which required stitches. All other systems were reviewed and are negative.  Physical  Exam: BP 90/60  Pulse 90  Ht 5\' 10"  (1.778 m)  Wt 224 lb 3.2 oz (101.696 kg)  BMI 32.17 kg/m2  SpO2 98% He is an  white male in no acute distress. HEENT: Unremarkable No JVD or bruits. Lungs: Clear Cardiovascular: Regular rate and rhythm, normal S1 and S2. Grade 2/6 systolic murmur at apex. Abdomen: Obese, soft, nontender. No masses or bruits. Extremities: Trace edema below the knees. Large venous varicosities. Skin: Warm and dry Neuro: Alert oriented x3. Cranial nerves II through XII are intact. LABORATORY DATA: Echo:Study Conclusions  - Left ventricle: The cavity size was moderately dilated. Wall thickness was normal. Systolic function was severely reduced. The estimated ejection fraction was in the range of 25% to 30%. Diffuse hypokinesis. There is akinesis of the anteroseptal myocardium. - Mitral valve: Mild regurgitation. - Left atrium: The atrium was moderately to severely dilated. - Right ventricle: Systolic function was mildly reduced. - Right atrium: The atrium was mildly dilated. - Pulmonary arteries: PA peak pressure: 31mm Hg (S).    Assessment / Plan: 1. Atrial fibrillation. Now persistent with poor ventricular rate control. Digoxin was added last month. Recent dig level was 1.2. Recent echo shows moderate to severe left atrial enlargement so I think that he is unlikely to be a good candidate for atrial fibrillation ablation. He is not a candidate for antiarrhythmic drug therapy. The question at this point is whether he should have AV nodal ablation. I will pose this question to [name]. The patient is are ready paced.  2. Coronary disease with remote anterior myocardial infarction. He is asymptomatic.  3. Chronic systolic congestive heart failure. He is on optimal therapy with carvedilol, ACE inhibitor, diuretics, and Inspra. He is hypotensive. His carvedilol dose was reduced last month. We will reduce his Lasix to 20 mg daily and his lisinopril to 5 mg daily.  Ejection fraction is 25-30%. Continue sodium restriction. I am very pleased with his efforts at weight loss and he will continue with [employer].

## 2013-07-02 ENCOUNTER — Telehealth: Payer: Self-pay | Admitting: *Deleted

## 2013-07-02 NOTE — Telephone Encounter (Signed)
Notes Recorded by Duke Salvia, MD on 06/30/2013 at 1:34 PM i called and spoke with pt and told him dig level was too high and he should cut his dose in half. Also we should get a dig level when he returns on 7/31 for his device check Thanks i also told him hsi LA dimension was stillwithin range for catheter ablation, but at this point the paucity of symptoms does not prompt referral for that

## 2013-07-17 ENCOUNTER — Telehealth: Payer: Self-pay | Admitting: Internal Medicine

## 2013-07-17 DIAGNOSIS — R7889 Finding of other specified substances, not normally found in blood: Secondary | ICD-10-CM

## 2013-07-17 NOTE — Telephone Encounter (Signed)
Pt called re getting digoxin level checked while here tomorrow for his device check, lab order not in, per heather mcghee's message : i called and spoke with pt and told him dig level was too high and he should cut his dose in half. Also we should get a dig level when he returns on 7/31 for his device check, can oder be place and pt will go to lab after device check at 1000a/mt

## 2013-07-17 NOTE — Telephone Encounter (Signed)
Order has been placed for lab

## 2013-07-18 ENCOUNTER — Other Ambulatory Visit: Payer: 59

## 2013-07-18 ENCOUNTER — Ambulatory Visit (INDEPENDENT_AMBULATORY_CARE_PROVIDER_SITE_OTHER): Payer: 59 | Admitting: *Deleted

## 2013-07-18 DIAGNOSIS — R7889 Finding of other specified substances, not normally found in blood: Secondary | ICD-10-CM

## 2013-07-18 DIAGNOSIS — I509 Heart failure, unspecified: Secondary | ICD-10-CM

## 2013-07-18 DIAGNOSIS — I5022 Chronic systolic (congestive) heart failure: Secondary | ICD-10-CM

## 2013-07-18 DIAGNOSIS — I428 Other cardiomyopathies: Secondary | ICD-10-CM

## 2013-07-18 LAB — ICD DEVICE OBSERVATION
ATRIAL PACING ICD: 0.7 pct
BAMS-0001: 170 {beats}/min
BATTERY VOLTAGE: 2.62 V
LV LEAD IMPEDENCE ICD: 712 Ohm
TZAT-0001ATACH: 1
TZAT-0001ATACH: 2
TZAT-0001SLOWVT: 1
TZAT-0001SLOWVT: 2
TZAT-0002ATACH: NEGATIVE
TZAT-0002ATACH: NEGATIVE
TZAT-0002FASTVT: NEGATIVE
TZAT-0004SLOWVT: 8
TZAT-0004SLOWVT: 8
TZAT-0005SLOWVT: 88 pct
TZAT-0005SLOWVT: 91 pct
TZAT-0012ATACH: 150 ms
TZAT-0018ATACH: NEGATIVE
TZAT-0018ATACH: NEGATIVE
TZAT-0018ATACH: NEGATIVE
TZAT-0018FASTVT: NEGATIVE
TZAT-0019ATACH: 6 V
TZAT-0019FASTVT: 8 V
TZAT-0020FASTVT: 1.5 ms
TZON-0003ATACH: 350 ms
TZON-0004SLOWVT: 32
TZON-0004VSLOWVT: 36
TZST-0001FASTVT: 6
TZST-0001SLOWVT: 3
TZST-0001SLOWVT: 6
TZST-0002ATACH: NEGATIVE
TZST-0002ATACH: NEGATIVE
TZST-0002FASTVT: NEGATIVE
TZST-0002FASTVT: NEGATIVE
TZST-0002FASTVT: NEGATIVE
TZST-0003SLOWVT: 20 J
TZST-0003SLOWVT: 35 J
VENTRICULAR PACING ICD: 62.5 pct

## 2013-07-18 NOTE — Progress Notes (Signed)
Battery check for estimated longevity with ICM in office.

## 2013-07-29 NOTE — Progress Notes (Signed)
BiV ICD check in device clinic for battery. Patient is doing well without complaints.  Normal BiV ICD function. No programming changes made. Not PPM dependent. No ventricular arrhythmias. Battery voltage 2.62 but not at RRT yet. ROV with me in 1 month.

## 2013-07-30 ENCOUNTER — Ambulatory Visit (INDEPENDENT_AMBULATORY_CARE_PROVIDER_SITE_OTHER): Payer: 59 | Admitting: Cardiology

## 2013-07-30 ENCOUNTER — Encounter: Payer: Self-pay | Admitting: Cardiology

## 2013-07-30 VITALS — BP 122/66 | HR 71 | Ht 70.0 in | Wt 224.4 lb

## 2013-07-30 DIAGNOSIS — I2589 Other forms of chronic ischemic heart disease: Secondary | ICD-10-CM

## 2013-07-30 DIAGNOSIS — I255 Ischemic cardiomyopathy: Secondary | ICD-10-CM

## 2013-07-30 DIAGNOSIS — Z4502 Encounter for adjustment and management of automatic implantable cardiac defibrillator: Secondary | ICD-10-CM

## 2013-07-30 DIAGNOSIS — I4891 Unspecified atrial fibrillation: Secondary | ICD-10-CM

## 2013-07-30 DIAGNOSIS — I5022 Chronic systolic (congestive) heart failure: Secondary | ICD-10-CM

## 2013-07-30 DIAGNOSIS — Z95810 Presence of automatic (implantable) cardiac defibrillator: Secondary | ICD-10-CM

## 2013-07-30 LAB — ICD DEVICE OBSERVATION
BAMS-0001: 170 {beats}/min
BATTERY VOLTAGE: 2.62 V
FVT: 0
LV LEAD THRESHOLD: 1 V
PACEART VT: 0
TZAT-0001ATACH: 1
TZAT-0001FASTVT: 1
TZAT-0004SLOWVT: 8
TZAT-0004SLOWVT: 8
TZAT-0011SLOWVT: 10 ms
TZAT-0011SLOWVT: 10 ms
TZAT-0012ATACH: 150 ms
TZAT-0012ATACH: 150 ms
TZAT-0012ATACH: 150 ms
TZAT-0012SLOWVT: 200 ms
TZAT-0012SLOWVT: 200 ms
TZAT-0013SLOWVT: 2
TZAT-0013SLOWVT: 2
TZAT-0018ATACH: NEGATIVE
TZAT-0018ATACH: NEGATIVE
TZAT-0020ATACH: 1.5 ms
TZAT-0020ATACH: 1.5 ms
TZAT-0020ATACH: 1.5 ms
TZAT-0020FASTVT: 1.5 ms
TZAT-0020SLOWVT: 1.5 ms
TZAT-0020SLOWVT: 1.5 ms
TZON-0003ATACH: 350 ms
TZON-0003SLOWVT: 340 ms
TZON-0003VSLOWVT: 400 ms
TZON-0004SLOWVT: 32
TZST-0001ATACH: 4
TZST-0001FASTVT: 2
TZST-0001FASTVT: 4
TZST-0001FASTVT: 6
TZST-0001SLOWVT: 4
TZST-0001SLOWVT: 6
TZST-0002ATACH: NEGATIVE
TZST-0002ATACH: NEGATIVE
TZST-0002FASTVT: NEGATIVE
TZST-0002FASTVT: NEGATIVE
TZST-0003SLOWVT: 20 J
TZST-0003SLOWVT: 25 J
TZST-0003SLOWVT: 35 J
VF: 0

## 2013-07-30 NOTE — Patient Instructions (Addendum)
Your physician recommends that you schedule a follow-up appointment in: 1 MONTH WITH BROOKE EDMISTEN-PA  Your physician recommends that you continue on your current medications as directed. Please refer to the Current Medication list given to you today.

## 2013-08-02 NOTE — Telephone Encounter (Signed)
Spoke with pt, aware of lab results and to keep his meds the same.

## 2013-08-02 NOTE — Telephone Encounter (Signed)
Follow Up ° ° ° °Pt is following up on test results. Please call. °

## 2013-08-21 ENCOUNTER — Encounter: Payer: Self-pay | Admitting: Internal Medicine

## 2013-08-22 ENCOUNTER — Telehealth: Payer: Self-pay | Admitting: Internal Medicine

## 2013-08-22 NOTE — Telephone Encounter (Signed)
Spoke with pt about option to come into the devic clinic to have alarm turned off. Pt states that it has been alarming for one week now and he just realized what it was. He states that he is  fine to wait until follow up with [facility] next week. I will let device clinic know. Advised him to contact us with any issues that arise

## 2013-08-22 NOTE — Telephone Encounter (Signed)
New prob  Pt states the alarm went off on his device. He would like to speak with someone.

## 2013-08-30 ENCOUNTER — Encounter: Payer: 59 | Admitting: Cardiology

## 2013-09-03 ENCOUNTER — Encounter: Payer: Self-pay | Admitting: Cardiology

## 2013-09-03 ENCOUNTER — Ambulatory Visit (INDEPENDENT_AMBULATORY_CARE_PROVIDER_SITE_OTHER): Payer: 59 | Admitting: Cardiology

## 2013-09-03 ENCOUNTER — Encounter: Payer: Self-pay | Admitting: *Deleted

## 2013-09-03 VITALS — BP 120/72 | HR 110 | Ht 70.0 in | Wt 222.2 lb

## 2013-09-03 DIAGNOSIS — I509 Heart failure, unspecified: Secondary | ICD-10-CM

## 2013-09-03 DIAGNOSIS — I255 Ischemic cardiomyopathy: Secondary | ICD-10-CM

## 2013-09-03 DIAGNOSIS — I5022 Chronic systolic (congestive) heart failure: Secondary | ICD-10-CM

## 2013-09-03 DIAGNOSIS — Z95810 Presence of automatic (implantable) cardiac defibrillator: Secondary | ICD-10-CM

## 2013-09-03 DIAGNOSIS — I4891 Unspecified atrial fibrillation: Secondary | ICD-10-CM

## 2013-09-03 DIAGNOSIS — Z4502 Encounter for adjustment and management of automatic implantable cardiac defibrillator: Secondary | ICD-10-CM

## 2013-09-03 DIAGNOSIS — I2589 Other forms of chronic ischemic heart disease: Secondary | ICD-10-CM

## 2013-09-03 LAB — ICD DEVICE OBSERVATION
AL AMPLITUDE: 2.6 mv
ATRIAL PACING ICD: 0.4 pct
FVT: 0
TZAT-0001ATACH: 1
TZAT-0001ATACH: 2
TZAT-0001ATACH: 3
TZAT-0001FASTVT: 1
TZAT-0001SLOWVT: 1
TZAT-0001SLOWVT: 2
TZAT-0002ATACH: NEGATIVE
TZAT-0002FASTVT: NEGATIVE
TZAT-0004SLOWVT: 8
TZAT-0004SLOWVT: 8
TZAT-0005SLOWVT: 88 pct
TZAT-0005SLOWVT: 91 pct
TZAT-0012ATACH: 150 ms
TZAT-0012ATACH: 150 ms
TZAT-0012ATACH: 150 ms
TZAT-0012SLOWVT: 200 ms
TZAT-0013SLOWVT: 2
TZAT-0013SLOWVT: 2
TZAT-0018ATACH: NEGATIVE
TZAT-0018ATACH: NEGATIVE
TZAT-0019ATACH: 6 V
TZAT-0020ATACH: 1.5 ms
TZAT-0020SLOWVT: 1.5 ms
TZAT-0020SLOWVT: 1.5 ms
TZON-0003ATACH: 350 ms
TZON-0003VSLOWVT: 400 ms
TZON-0004VSLOWVT: 36
TZON-0005SLOWVT: 12
TZST-0001ATACH: 4
TZST-0001ATACH: 6
TZST-0001FASTVT: 3
TZST-0001FASTVT: 4
TZST-0001FASTVT: 5
TZST-0001SLOWVT: 3
TZST-0001SLOWVT: 5
TZST-0001SLOWVT: 6
TZST-0002FASTVT: NEGATIVE
TZST-0002FASTVT: NEGATIVE
TZST-0003SLOWVT: 20 J
TZST-0003SLOWVT: 25 J
TZST-0003SLOWVT: 35 J
VENTRICULAR PACING ICD: 62.4 pct
VF: 0

## 2013-09-03 NOTE — Patient Instructions (Addendum)
Your physician has recommended that you have a defibrillator inserted. An implantable cardioverter defibrillator (ICD) is a small device that is placed in your chest or, in rare cases, your abdomen. This device uses electrical pulses or shocks to help control life-threatening, irregular heartbeats that could lead the heart to suddenly stop beating (sudden cardiac arrest). Leads are attached to the ICD that goes into your heart. This is done in the hospital and usually requires an overnight stay. Please see the instruction sheet given to you today for more information.  Your physician has recommended you make the following change in your medication:   HOLD LASIX(FUROSEMIDE) THE MORNING OF PROCEDURE

## 2013-09-03 NOTE — Progress Notes (Signed)
 ELECTROPHYSIOLOGY OFFICE NOTE  Patient ID: Deo W Dauphinais MRN: [id number], DOB/AGE: 08/21/1946   Date of Visit: 09/03/2013  Primary Physician: BURNETT,BRENT A, MD Primary Cardiologist: Jordan, MD / Klein, MD Reason for Visit: EP/device follow-up  History of Present Illness  [name] is a 67 y.o. male with a mixed ischemic/nonischemic CM, EF 20-25%, s/p CRT-D, chronic systolic HF, CAD and AF who presents today for routine electrophysiology followup. His BiV ICD battery is at ERI.   Since last being seen in our clinic, he reports he is doing well and has no complaints. He has SOB with moderate exertion but this is not new or worse for him. He states he is able to walk his dog every day about 1/2 mile without stopping to rest or catch his breath. He denies any worsening DOE. He denies chest pain. He denies palpitations, dizziness, near syncope or syncope. He has intermittent LE swelling but denies worsening. He denies orthopnea, PND or recent weight gain. He is compliant and tolerating medications without difficulty.  Past Medical History Past Medical History  Diagnosis Date  . Coronary artery disease   . MI (myocardial infarction)     ANTERIOR  . Ischemic cardiomyopathy     EF 23%  . PAF (paroxysmal atrial fibrillation)   . Chronic renal insufficiency   . Dyslipidemia   . Depression   . ED (erectile dysfunction)   . Other primary cardiomyopathies     Past Surgical History Past Surgical History  Procedure Laterality Date  . Cardiac catheterization  02/05/2008    MODERATE TO SEVERE LEFT VENTRICULAR  DYSFUNCTION WITH EF 25-30%  . Coronary stent placement      LAD  . Transthoracic echocardiogram  12/2010    EF 30-35%  . Cardiac defibrillator placement      BI-VENTRICULAR ICD  . Wrist fracture surgery    . Breast lumpectomy    . Cardiac defibrillator placement      Allergies/Intolerances No Known Allergies  Current Home Medications Current Outpatient Prescriptions    Medication Sig Dispense Refill  . Ascorbic Acid (VITAMIN C) 250 MG tablet Take 250 mg by mouth daily.        . carvedilol (COREG) 12.5 MG tablet Take 1 tablet (12.5 mg total) by mouth 2 (two) times daily.  180 tablet  3  . digoxin (LANOXIN) 0.125 MG tablet Take 1 tablet (0.125 mg total) by mouth daily.  30 tablet  3  . eplerenone (INSPRA) 25 MG tablet Take 1 tablet (25 mg total) by mouth daily.  90 tablet  3  . fenofibrate 160 MG tablet Take 1 tablet by mouth  daily  90 tablet  1  . folic acid (FOLVITE) 800 MCG tablet Take 800 mcg by mouth daily.       . furosemide (LASIX) 40 MG tablet Take 10 mg by mouth daily.       . lisinopril (PRINIVIL,ZESTRIL) 10 MG tablet Take 10 mg by mouth daily. 1/2 (5mg) daily      . LORazepam (ATIVAN) 1 MG tablet Take 1 mg by mouth as needed for anxiety.       . Multiple Vitamins-Minerals (MULTIVITAMIN WITH MINERALS) tablet Take 1 tablet by mouth daily.        . nitroGLYCERIN (NITROSTAT) 0.4 MG SL tablet Place 1 tablet (0.4 mg total) under the tongue every 5 (five) minutes as needed.  25 tablet  6  . potassium chloride SA (K-DUR,KLOR-CON) 20 MEQ tablet Take 0.5 tablets (10 mEq total)   by mouth daily.  90 tablet  1  . pravastatin (PRAVACHOL) 40 MG tablet TAKE 1 TABLET AT BEDTIME  90 tablet  1  . sertraline (ZOLOFT) 100 MG tablet Take 100 mg by mouth daily.        . warfarin (COUMADIN) 5 MG tablet Take by mouth as directed.        No current facility-administered medications for this visit.    Social History History   Social History  . Marital Status: Married    Spouse Name: N/A    Number of Children: 2  . Years of Education: N/A   Occupational History  . disabled    Social History Main Topics  . Smoking status: Former Smoker    Types: Cigarettes    Quit date: 12/23/1998  . Smokeless tobacco: Not on file  . Alcohol Use: No  . Drug Use: No  . Sexual Activity: Not on file   Other Topics Concern  . Not on file   Social History Narrative  . No  narrative on file     Review of Systems General: No chills, fever, night sweats or weight changes Cardiovascular: No chest pain, dyspnea on exertion, edema, orthopnea, palpitations, paroxysmal nocturnal dyspnea Dermatological: No rash, lesions or masses Respiratory: No cough, dyspnea Urologic: No hematuria, dysuria Abdominal: No nausea, vomiting, diarrhea, bright red blood per rectum, melena, or hematemesis Neurologic: No visual changes, weakness, changes in mental status All other systems reviewed and are otherwise negative except as noted above.  Physical Exam Vitals: Blood pressure 120/72, pulse 110, height 5' 10" (1.778 m), weight 222 lb 3.2 oz (100.789 kg).  General: Well developed, well appearing 67 y.o. male in no acute distress. HEENT: Normocephalic, atraumatic. EOMs intact. Sclera nonicteric. Oropharynx clear.  Neck: Supple without bruits. No JVD. Lungs: Respirations regular and unlabored, CTA bilaterally. No wheezes, rales or rhonchi. Heart: RRR. S1, S2 present. No murmurs, rub, S3 or S4. Abdomen: Soft, non-tender, non-distended. BS present x 4 quadrants. No hepatosplenomegaly.  Extremities: No clubbing, cyanosis or edema. DP/PT/Radials 2+ and equal bilaterally. Psych: Normal affect. Neuro: Alert and oriented X 3. Moves all extremities spontaneously.   Diagnostics Device interrogation today - Battery at RRT. Thresholds and sensing consistent with previous device measurements. Lead impedance trends stable over time. Persistent Afib (100%) since Dec 2013. No ventricular arrhythmia episodes recorded. Patient bi-ventricularly pacing 66.8% of time, VSR pacing 33.6% of time. Device programmed with appropriate safety margins. Heart failure diagnostics reviewed and elevated since June 2014. Patient denies worsening HF symptoms. No changes made this session. Scheduled for BiV ICD gen change with Dr. Klein in next 1-2 weeks.  Assessment and Plan 1. BiV ICD battery at RRT 2. Mixed  ischemic/nonischemic CM, EF 25-30% 3. Chronic systolic HF, OptiVol increased although Mr. Voit denies worsening HF symptoms and is not volume overloaded on exam 4. CAD 5. Atrial fibrillation, LA dimension 48 mm  Mr. Altamura presents for EP/device follow-up. His BiV ICD battery is at RRT. I discussed the need for BiV ICD generator change with him. I reviewed the procedure involved, including risks and benefits, in detail. These risks include but are not limited to bleeding, infection and/or lead dislodgement / malfunction. Mr. Emminger expressed verbal understanding of the procedure including risks and benefits and wishes to proceed. This will be scheduled with Dr. Klein at the next available time.   Signed, Cannen Dupras, PA-C 09/03/2013, 11:33 AM    

## 2013-09-09 ENCOUNTER — Encounter (HOSPITAL_COMMUNITY): Payer: Self-pay | Admitting: Pharmacy Technician

## 2013-09-11 ENCOUNTER — Other Ambulatory Visit (INDEPENDENT_AMBULATORY_CARE_PROVIDER_SITE_OTHER): Payer: 59

## 2013-09-11 DIAGNOSIS — Z4502 Encounter for adjustment and management of automatic implantable cardiac defibrillator: Secondary | ICD-10-CM

## 2013-09-11 DIAGNOSIS — I5022 Chronic systolic (congestive) heart failure: Secondary | ICD-10-CM

## 2013-09-11 DIAGNOSIS — I4891 Unspecified atrial fibrillation: Secondary | ICD-10-CM

## 2013-09-11 DIAGNOSIS — Z95810 Presence of automatic (implantable) cardiac defibrillator: Secondary | ICD-10-CM

## 2013-09-11 DIAGNOSIS — I2589 Other forms of chronic ischemic heart disease: Secondary | ICD-10-CM

## 2013-09-11 DIAGNOSIS — I509 Heart failure, unspecified: Secondary | ICD-10-CM

## 2013-09-11 DIAGNOSIS — I255 Ischemic cardiomyopathy: Secondary | ICD-10-CM

## 2013-09-11 LAB — CBC WITH DIFFERENTIAL/PLATELET
Basophils Relative: 0.4 % (ref 0.0–3.0)
Eosinophils Relative: 1.7 % (ref 0.0–5.0)
HCT: 43.7 % (ref 39.0–52.0)
Lymphs Abs: 2.2 10*3/uL (ref 0.7–4.0)
MCV: 81.1 fl (ref 78.0–100.0)
Monocytes Absolute: 0.7 10*3/uL (ref 0.1–1.0)
Monocytes Relative: 8.2 % (ref 3.0–12.0)
Neutrophils Relative %: 65.9 % (ref 43.0–77.0)
Platelets: 259 10*3/uL (ref 150.0–400.0)
RBC: 5.39 Mil/uL (ref 4.22–5.81)
WBC: 9.1 10*3/uL (ref 4.5–10.5)

## 2013-09-11 LAB — BASIC METABOLIC PANEL
BUN: 29 mg/dL — ABNORMAL HIGH (ref 6–23)
CO2: 31 mEq/L (ref 19–32)
Chloride: 105 mEq/L (ref 96–112)
Creatinine, Ser: 1.5 mg/dL (ref 0.4–1.5)
GFR: 49.8 mL/min — ABNORMAL LOW (ref >60.00)
Potassium: 4.3 mEq/L (ref 3.5–5.1)
Sodium: 140 mEq/L (ref 135–145)

## 2013-09-11 LAB — PROTIME-INR
INR: 2.5 ratio — ABNORMAL HIGH (ref 0.8–1.0)
Prothrombin Time: 25.8 s — ABNORMAL HIGH (ref 10.2–12.4)

## 2013-09-13 ENCOUNTER — Telehealth: Payer: Self-pay | Admitting: Internal Medicine

## 2013-09-13 NOTE — Telephone Encounter (Signed)
I spoke with patient and explained that [name] has not seen labs yet, but that he should review them before patient's procedure. I sent [name] a message and told patient that we would be in touch if there were any instructions on his Coumadin. Patient agreeable to plan.

## 2013-09-13 NOTE — Telephone Encounter (Signed)
New problem:  Test results.  

## 2013-09-15 MED ORDER — SODIUM CHLORIDE 0.9 % IR SOLN
80.0000 mg | Status: AC
  Filled 2013-09-15: qty 2

## 2013-09-15 MED ORDER — CEFAZOLIN SODIUM-DEXTROSE 2-3 GM-% IV SOLR
2.0000 g | INTRAVENOUS | Status: AC
  Filled 2013-09-15: qty 50

## 2013-09-16 ENCOUNTER — Encounter (HOSPITAL_COMMUNITY)
Admission: RE | Disposition: A | Discharge status: Home / Self Care | Payer: Self-pay | Source: Ambulatory Visit | Attending: Internal Medicine

## 2013-09-16 ENCOUNTER — Ambulatory Visit (HOSPITAL_COMMUNITY)
Admission: RE | Admit: 2013-09-16 | Discharge: 2013-09-16 | Disposition: A | Discharge status: Home / Self Care | Payer: 59 | Source: Ambulatory Visit | Attending: Internal Medicine | Admitting: Internal Medicine

## 2013-09-16 DIAGNOSIS — I2510 Atherosclerotic heart disease of native coronary artery without angina pectoris: Secondary | ICD-10-CM | POA: Insufficient documentation

## 2013-09-16 DIAGNOSIS — E785 Hyperlipidemia, unspecified: Secondary | ICD-10-CM | POA: Insufficient documentation

## 2013-09-16 DIAGNOSIS — I2589 Other forms of chronic ischemic heart disease: Secondary | ICD-10-CM | POA: Insufficient documentation

## 2013-09-16 DIAGNOSIS — I5022 Chronic systolic (congestive) heart failure: Secondary | ICD-10-CM | POA: Insufficient documentation

## 2013-09-16 DIAGNOSIS — I4891 Unspecified atrial fibrillation: Secondary | ICD-10-CM | POA: Insufficient documentation

## 2013-09-16 DIAGNOSIS — Z9861 Coronary angioplasty status: Secondary | ICD-10-CM | POA: Insufficient documentation

## 2013-09-16 DIAGNOSIS — Z4502 Encounter for adjustment and management of automatic implantable cardiac defibrillator: Secondary | ICD-10-CM | POA: Insufficient documentation

## 2013-09-16 DIAGNOSIS — I428 Other cardiomyopathies: Secondary | ICD-10-CM | POA: Insufficient documentation

## 2013-09-16 HISTORY — PX: BIV ICD GENERTAOR CHANGE OUT: SHX5745

## 2013-09-16 LAB — SURGICAL PCR SCREEN
MRSA, PCR: NEGATIVE
Staphylococcus aureus: NEGATIVE

## 2013-09-16 LAB — PROTIME-INR
INR: 2.03 — ABNORMAL HIGH (ref 0.00–1.49)
Prothrombin Time: 22.3 seconds — ABNORMAL HIGH (ref 11.6–15.2)

## 2013-09-16 SURGERY — BIV ICD GENERTAOR CHANGE OUT
Anesthesia: LOCAL

## 2013-09-16 MED ORDER — ACETAMINOPHEN 325 MG PO TABS: 325.0000 mg | ORAL_TABLET | ORAL | Status: DC | PRN

## 2013-09-16 MED ORDER — FENTANYL CITRATE 0.05 MG/ML IJ SOLN
INTRAMUSCULAR | Status: AC
  Filled 2013-09-16: qty 2

## 2013-09-16 MED ORDER — MIDAZOLAM HCL 5 MG/5ML IJ SOLN
INTRAMUSCULAR | Status: AC
  Filled 2013-09-16: qty 5

## 2013-09-16 MED ORDER — ONDANSETRON HCL 4 MG/2ML IJ SOLN: 4.0000 mg | Freq: Four times a day (QID) | INTRAMUSCULAR | Status: DC | PRN

## 2013-09-16 MED ORDER — MUPIROCIN 2 % EX OINT: TOPICAL_OINTMENT | Freq: Two times a day (BID) | CUTANEOUS | Status: DC

## 2013-09-16 MED ORDER — MUPIROCIN 2 % EX OINT
TOPICAL_OINTMENT | CUTANEOUS | Status: AC
  Administered 2013-09-16: 1
  Filled 2013-09-16: qty 22

## 2013-09-16 MED ORDER — SODIUM CHLORIDE 0.9 % IV SOLN: INTRAVENOUS | Status: AC

## 2013-09-16 MED ORDER — SODIUM CHLORIDE 0.9 % IV SOLN
INTRAVENOUS | Status: DC
  Administered 2013-09-16: 10:00:00 via INTRAVENOUS

## 2013-09-16 MED ORDER — LIDOCAINE HCL (PF) 1 % IJ SOLN
INTRAMUSCULAR | Status: AC
  Filled 2013-09-16: qty 60

## 2013-09-16 MED ORDER — CHLORHEXIDINE GLUCONATE 4 % EX LIQD: 60.0000 mL | Freq: Once | CUTANEOUS | Status: DC

## 2013-09-16 NOTE — CV Procedure (Signed)
Preoperative diagnosis  Prev CRT-ICD with device at ERI; perm AFib  Postoperative diagnosis same/   Procedure: Generator replacement  Pocket revision  Following informed consent the patient was brought to the electrophysiology laboratory in place of the fluoroscopic table in the supine position after routine prep and drape lidocaine was infiltrated in the region of the previous incision and carried down to later the device pocket using sharp dissection and electrocautery. The pocket was opened the device was freed up and was explanted.  Interrogation of the previously implanted ICD ventricular lead Medtronic [phone]  demonstrated an R wave of 9.2  millivolts., and impedance of 440 ohms, and a pacing threshold of 0.9 volts at 0.5 msec.  HV impedance 330 prox and distal about       Interrogation of the previously implanted Left ventricular lead Medtronic [id number]  demonstrated an R wave of 20  millivolts., and impedance of 934 ohms, and a pacing threshold of 0.7 volts at 0.5 msec.    The previously implanted atrial lead Medtronic [id number] demonstrated a P-wave amplitude of 4.2_FIB milllivolts  and impedance of  399 ohms   The leads were inspected. Repair was not needed. The leads were then attached to a Medtronic VIVA  pulse generator, serial number [id number].    Through the device the P-wave amplitude  Was  3.0 milllivolts and impedance of  456 ohms,  ; the ICD ventricular lead demonstrated an R wave of 9.1  millivolts., and impedance of 380 ohms, and a pacing threshold of 1 volts at 0.6 msec and the  Left ventricular lead demonstrated an  impedance of 934 ohms, and a pacing threshold of 1 volts at 0.6 msec.    High voltage impedances were 60 and 52 ohms  The pocket was irrigated with antibiotic containing saline solution hemostasis was assured and the leads and the device were placed in the pocket. The wound was then closed in 3 layers in normal fashion. Dermabond was applied  The patient tolerated the  procedure without apparent complication.  DFT testing was not performed  Sherryl Manges   \  .

## 2013-09-16 NOTE — Interval H&P Note (Signed)
ICD Criteria  Current LVEF (within 6 months):25%  NYHA Functional Classification: Class II  Heart Failure History:  Yes, Duration of heart failure since onset is > 9 months  Non-Ischemic Dilated Cardiomyopathy History:  Yes, timeframe is > 9 months  Atrial Fibrillation/Atrial Flutter:  Yes, A-Fib/A-Flutter type: Permanent (>1 year).  Ventricular Tachycardia History:  No.  Cardiac Arrest History:  No  History of Syndromes with Risk of Sudden Death:  No.  Previous ICD:  Yes, ICD Type:  CRT-D, Reason for ICD:  Primary prevention.  25%  Electrophysiology Study: No.  Anticoagulation Therapy:  Patient is on anticoagulation therapy, anticoagulation was NOT held prior to procedure.   Beta Blocker Therapy:  Yes.   Ace Inhibitor/ARB Therapy:  Yes.History and Physical Interval Note:  09/16/2013 10:20 AM  [name]  has presented today for surgery, with the diagnosis of afib  The various methods of treatment have been discussed with the patient and family. After consideration of risks, benefits and other options for treatment, the patient has consented to  Procedure(s): BIV ICD GENERTAOR CHANGE OUT (N/A) as a surgical intervention .  The patient's history has been reviewed, patient examined, no change in status, stable for surgery.  I have reviewed the patient's chart and labs.  Questions were answered to the patient's satisfaction.     Sherryl Manges

## 2013-09-16 NOTE — H&P (View-Only) (Signed)
ELECTROPHYSIOLOGY OFFICE NOTE  Patient ID: [name] MRN: [id number], DOB/AGE: 1946-11-19   Date of Visit: 09/03/2013  Primary Physician: Delorse Lek, MD Primary Cardiologist: [place], MD / Graciela Husbands, MD Reason for Visit: EP/device follow-up  History of Present Illness  [name] is a 67 y.o. male with a mixed ischemic/nonischemic CM, EF 20-25%, s/p CRT-D, chronic systolic HF, CAD and AF who presents today for routine electrophysiology followup. His BiV ICD battery is at [facility].   Since last being seen in our clinic, he reports he is doing well and has no complaints. He has SOB with moderate exertion but this is not new or worse for him. He states he is able to walk his dog every day about 1/2 mile without stopping to rest or catch his breath. He denies any worsening DOE. He denies chest pain. He denies palpitations, dizziness, near syncope or syncope. He has intermittent LE swelling but denies worsening. He denies orthopnea, PND or recent weight gain. He is compliant and tolerating medications without difficulty.  Past Medical History Past Medical History  Diagnosis Date  . Coronary artery disease   . MI (myocardial infarction)     ANTERIOR  . Ischemic cardiomyopathy     EF 23%  . PAF (paroxysmal atrial fibrillation)   . Chronic renal insufficiency   . Dyslipidemia   . Depression   . ED (erectile dysfunction)   . Other primary cardiomyopathies     Past Surgical History Past Surgical History  Procedure Laterality Date  . Cardiac catheterization  02/05/2008    MODERATE TO SEVERE LEFT VENTRICULAR  DYSFUNCTION WITH EF 25-30%  . Coronary stent placement      LAD  . Transthoracic echocardiogram  12/2010    EF 30-35%  . Cardiac defibrillator placement      BI-VENTRICULAR ICD  . Wrist fracture surgery    . Breast lumpectomy    . Cardiac defibrillator placement      Allergies/Intolerances No Known Allergies  Current Home Medications Current Outpatient Prescriptions    Medication Sig Dispense Refill  . Ascorbic Acid (VITAMIN C) 250 MG tablet Take 250 mg by mouth daily.        . carvedilol (COREG) 12.5 MG tablet Take 1 tablet (12.5 mg total) by mouth 2 (two) times daily.  180 tablet  3  . digoxin (LANOXIN) 0.125 MG tablet Take 1 tablet (0.125 mg total) by mouth daily.  30 tablet  3  . eplerenone (INSPRA) 25 MG tablet Take 1 tablet (25 mg total) by mouth daily.  90 tablet  3  . fenofibrate 160 MG tablet Take 1 tablet by mouth  daily  90 tablet  1  . folic acid (FOLVITE) 800 MCG tablet Take 800 mcg by mouth daily.       . furosemide (LASIX) 40 MG tablet Take 10 mg by mouth daily.       Marland Kitchen lisinopril (PRINIVIL,ZESTRIL) 10 MG tablet Take 10 mg by mouth daily. 1/2 (5mg ) daily      . LORazepam (ATIVAN) 1 MG tablet Take 1 mg by mouth as needed for anxiety.       . Multiple Vitamins-Minerals (MULTIVITAMIN WITH MINERALS) tablet Take 1 tablet by mouth daily.        . nitroGLYCERIN (NITROSTAT) 0.4 MG SL tablet Place 1 tablet (0.4 mg total) under the tongue every 5 (five) minutes as needed.  25 tablet  6  . potassium chloride SA (K-DUR,KLOR-CON) 20 MEQ tablet Take 0.5 tablets (10 mEq total)  by mouth daily.  90 tablet  1  . pravastatin (PRAVACHOL) 40 MG tablet TAKE 1 TABLET AT BEDTIME  90 tablet  1  . sertraline (ZOLOFT) 100 MG tablet Take 100 mg by mouth daily.        Marland Kitchen warfarin (COUMADIN) 5 MG tablet Take by mouth as directed.        No current facility-administered medications for this visit.    Social History History   Social History  . Marital Status: Married    Spouse Name: N/A    Number of Children: 2  . Years of Education: N/A   Occupational History  . disabled    Social History Main Topics  . Smoking status: Former Smoker    Types: Cigarettes    Quit date: 12/23/1998  . Smokeless tobacco: Not on file  . Alcohol Use: No  . Drug Use: No  . Sexual Activity: Not on file   Other Topics Concern  . Not on file   Social History Narrative  . No  narrative on file     Review of Systems General: No chills, fever, night sweats or weight changes Cardiovascular: No chest pain, dyspnea on exertion, edema, orthopnea, palpitations, paroxysmal nocturnal dyspnea Dermatological: No rash, lesions or masses Respiratory: No cough, dyspnea Urologic: No hematuria, dysuria Abdominal: No nausea, vomiting, diarrhea, bright red blood per rectum, melena, or hematemesis Neurologic: No visual changes, weakness, changes in mental status All other systems reviewed and are otherwise negative except as noted above.  Physical Exam Vitals: Blood pressure 120/72, pulse 110, height 5\' 10"  (1.778 m), weight 222 lb 3.2 oz (100.789 kg).  General: Well developed, well appearing 67 y.o. male in no acute distress. HEENT: Normocephalic, atraumatic. EOMs intact. Sclera nonicteric. Oropharynx clear.  Neck: Supple without bruits. No JVD. Lungs: Respirations regular and unlabored, CTA bilaterally. No wheezes, rales or rhonchi. Heart: RRR. S1, S2 present. No murmurs, rub, S3 or S4. Abdomen: Soft, non-tender, non-distended. BS present x 4 quadrants. No hepatosplenomegaly.  Extremities: No clubbing, cyanosis or edema. DP/PT/Radials 2+ and equal bilaterally. Psych: Normal affect. Neuro: Alert and oriented X 3. Moves all extremities spontaneously.   Diagnostics Device interrogation today - Battery at [facility]. Thresholds and sensing consistent with previous device measurements. Lead impedance trends stable over time. Persistent Afib (100%) since Dec 2013. No ventricular arrhythmia episodes recorded. Patient bi-ventricularly pacing 65.8% of time, VSR pacing 33.6% of time. Device programmed with appropriate safety margins. Heart failure diagnostics reviewed and elevated since June 2014. Patient denies worsening HF symptoms. No changes made this session. Scheduled for BiV ICD gen change with [name] in next 1-2 weeks.  Assessment and Plan 1. BiV ICD battery at RRT 2. Mixed  ischemic/nonischemic CM, EF 25-30% 3. Chronic systolic HF, OptiVol increased although [name] denies worsening HF symptoms and is not volume overloaded on exam 4. CAD 5. Atrial fibrillation, LA dimension 48 mm  [name] presents for EP/device follow-up. His BiV ICD battery is at [facility]. I discussed the need for BiV ICD generator change with him. I reviewed the procedure involved, including risks and benefits, in detail. These risks include but are not limited to bleeding, infection and/or lead dislodgement / malfunction. [name] expressed verbal understanding of the procedure including risks and benefits and wishes to proceed. This will be scheduled with [name] at the next available time.   Signed, Rick Duff, PA-C 09/03/2013, 11:33 AM

## 2013-09-16 NOTE — Interval H&P Note (Signed)
History and Physical Interval Note:  09/16/2013 11:22 AM  [name]  has presented today for surgery, with the diagnosis of afib  The various methods of treatment have been discussed with the patient and family. After consideration of risks, benefits and other options for treatment, the patient has consented to  Procedure(s): BIV ICD GENERTAOR CHANGE OUT (N/A) as a surgical intervention .  The patient's history has been reviewed, patient examined, no change in status, stable for surgery.  I have reviewed the patient's chart and labs.  Questions were answered to the patient's satisfaction.     Kamri Gotsch   

## 2013-09-16 NOTE — Interval H&P Note (Signed)
History and Physical Interval Note:  09/16/2013 11:22 AM  [name]  has presented today for surgery, with the diagnosis of afib  The various methods of treatment have been discussed with the patient and family. After consideration of risks, benefits and other options for treatment, the patient has consented to  Procedure(s): BIV ICD GENERTAOR CHANGE OUT (N/A) as a surgical intervention .  The patient's history has been reviewed, patient examined, no change in status, stable for surgery.  I have reviewed the patient's chart and labs.  Questions were answered to the patient's satisfaction.     Sherryl Manges

## 2013-09-19 ENCOUNTER — Telehealth: Payer: Self-pay | Admitting: Internal Medicine

## 2013-09-19 NOTE — Telephone Encounter (Signed)
New problem  Pt wants to know if we are sharing information with his primary care at family practice. Please advise.

## 2013-09-23 ENCOUNTER — Other Ambulatory Visit: Payer: Self-pay | Admitting: Cardiology

## 2013-09-24 ENCOUNTER — Encounter: Payer: Self-pay | Admitting: Internal Medicine

## 2013-10-02 ENCOUNTER — Ambulatory Visit (INDEPENDENT_AMBULATORY_CARE_PROVIDER_SITE_OTHER): Payer: 59 | Admitting: *Deleted

## 2013-10-02 DIAGNOSIS — I428 Other cardiomyopathies: Secondary | ICD-10-CM

## 2013-10-02 DIAGNOSIS — I5022 Chronic systolic (congestive) heart failure: Secondary | ICD-10-CM

## 2013-10-02 DIAGNOSIS — I509 Heart failure, unspecified: Secondary | ICD-10-CM

## 2013-10-02 DIAGNOSIS — I4891 Unspecified atrial fibrillation: Secondary | ICD-10-CM

## 2013-10-02 LAB — ICD DEVICE OBSERVATION
AL AMPLITUDE: 1 mv
AL IMPEDENCE ICD: 513 Ohm
ATRIAL PACING ICD: 0 pct
FVT: 0
MODE SWITCH EPISODES: 1
PACEART VT: 0
RV LEAD AMPLITUDE: 16.1 mv
RV LEAD IMPEDENCE ICD: 399 Ohm
TZAT-0004SLOWVT: 8
TZAT-0011SLOWVT: 10 ms
TZAT-0013SLOWVT: 2
TZAT-0018SLOWVT: NEGATIVE
TZAT-0018SLOWVT: NEGATIVE
TZON-0003ATACH: 350.8 ms
TZON-0003SLOWVT: 340.9 ms
TZON-0003VSLOWVT: 400 ms
TZON-0004SLOWVT: 32
TZON-0005SLOWVT: 12
TZST-0001SLOWVT: 4
TZST-0001SLOWVT: 5
TZST-0003SLOWVT: 20 J
TZST-0003SLOWVT: 25 J
TZST-0003SLOWVT: 35 J

## 2013-10-02 NOTE — Progress Notes (Signed)
Wound check appointment. Steri-strips removed. Wound without redness or edema. Incision edges approximated, wound well healed. Normal device function. Thresholds, sensing, and impedances consistent with implant measurements. Histogram distribution appropriate for patient and level of activity. Patient has been in AF since at least his implant (9-29)---currently BiV pacing 65.5% (99.1% with 33.6% as VSRp). No ventricular arrhythmias. Patient educated about wound care, arm mobility, lifting restrictions, shock plan. ROV in 3 months with SK.

## 2013-10-03 ENCOUNTER — Encounter: Payer: Self-pay | Admitting: Internal Medicine

## 2013-10-04 NOTE — Telephone Encounter (Signed)
Last office visit and recent labs sent to patient's PCP per his request.

## 2013-10-14 ENCOUNTER — Ambulatory Visit (INDEPENDENT_AMBULATORY_CARE_PROVIDER_SITE_OTHER): Payer: 59 | Admitting: Cardiology

## 2013-10-14 ENCOUNTER — Encounter: Payer: Self-pay | Admitting: Cardiology

## 2013-10-14 VITALS — BP 110/79 | HR 75 | Ht 70.0 in | Wt 213.1 lb

## 2013-10-14 DIAGNOSIS — I509 Heart failure, unspecified: Secondary | ICD-10-CM

## 2013-10-14 DIAGNOSIS — E785 Hyperlipidemia, unspecified: Secondary | ICD-10-CM

## 2013-10-14 DIAGNOSIS — I4891 Unspecified atrial fibrillation: Secondary | ICD-10-CM

## 2013-10-14 DIAGNOSIS — I2510 Atherosclerotic heart disease of native coronary artery without angina pectoris: Secondary | ICD-10-CM

## 2013-10-14 DIAGNOSIS — I5022 Chronic systolic (congestive) heart failure: Secondary | ICD-10-CM

## 2013-10-14 MED ORDER — NITROGLYCERIN 0.4 MG SL SUBL: 0.4000 mg | SUBLINGUAL_TABLET | SUBLINGUAL | Status: DC | PRN

## 2013-10-14 NOTE — Patient Instructions (Signed)
Continue your current therapy  I will see you in 6 months.   

## 2013-10-14 NOTE — Progress Notes (Signed)
[name] Date of Birth: 1946/05/24 Medical Record #[id number]  History of Present Illness: [name] is seen today for followup.  He has a history of remote anterior myocardial infarction with an ischemic cardiomyopathy and ejection fraction of 23%. He is status post CRT D. He had a generator replacement in September 2014. He also has a history of atrial fibrillation. He is intolerant of amiodarone. His QT interval is too long for Tikosyn. He previously had attempted to increase his carvedilol dose but he developed significant hypotension. He is on Lanoxin for rate control. His last defibrillator check demonstrated that he was paced two thirds of the time and had intrinsic conduction one third of the time. He reports he is doing very well. He has lost an additional 11 pounds since July. He is doing this primarily by eating a healthy diet. His activity level is still fairly low. He denies any dyspnea, chest pain, edema, or palpitations.  Current Outpatient Prescriptions on File Prior to Visit  Medication Sig Dispense Refill  . Ascorbic Acid (VITAMIN C) 250 MG tablet Take 250 mg by mouth daily.        . carvedilol (COREG) 25 MG tablet Take 12.5 mg by mouth 2 (two) times daily with a meal.      . digoxin (LANOXIN) 0.125 MG tablet Take 0.0625 mg by mouth daily.      Marland Kitchen eplerenone (INSPRA) 25 MG tablet Take 1 tablet by mouth  daily  90 tablet  2  . fenofibrate 160 MG tablet Take 1 tablet by mouth  daily  90 tablet  2  . folic acid (FOLVITE) 800 MCG tablet Take 800 mcg by mouth daily.       . furosemide (LASIX) 40 MG tablet Take 20 mg by mouth daily.       Marland Kitchen lisinopril (PRINIVIL,ZESTRIL) 10 MG tablet Take 5 mg by mouth daily.       Marland Kitchen LORazepam (ATIVAN) 1 MG tablet Take 1 mg by mouth as needed for anxiety.       . Multiple Vitamins-Minerals (MULTIVITAMIN WITH MINERALS) tablet Take 1 tablet by mouth daily.        . potassium chloride SA (K-DUR,KLOR-CON) 20 MEQ tablet Take 10 mEq by mouth daily.       . pravastatin (PRAVACHOL) 40 MG tablet Take 40 mg by mouth at bedtime.      . sertraline (ZOLOFT) 100 MG tablet Take 100 mg by mouth daily.        Marland Kitchen warfarin (COUMADIN) 5 MG tablet Take 2.5-5 mg by mouth daily. Take one tablet (5mg ) on Monday and Thursday. Take half-tablet (2.5mg ) on Sunday, Tuesday, Wednesday, Friday, and Saturday.       No current facility-administered medications on file prior to visit.    No Known Allergies  Past Medical History  Diagnosis Date  . Coronary artery disease   . MI (myocardial infarction)     ANTERIOR  . Ischemic cardiomyopathy     EF 23%  . PAF (paroxysmal atrial fibrillation)   . Chronic renal insufficiency   . Dyslipidemia   . Depression   . ED (erectile dysfunction)   . Other primary cardiomyopathies     Past Surgical History  Procedure Laterality Date  . Cardiac catheterization  02/05/2008    MODERATE TO SEVERE LEFT VENTRICULAR  DYSFUNCTION WITH EF 25-30%  . Coronary stent placement      LAD  . Transthoracic echocardiogram  12/2010    EF 30-35%  . Cardiac  defibrillator placement      BI-VENTRICULAR ICD  . Wrist fracture surgery    . Breast lumpectomy    . Cardiac defibrillator placement      History  Smoking status  . Former Smoker  . Types: Cigarettes  . Quit date: 12/23/1998  Smokeless tobacco  . Not on file    History  Alcohol Use No    Family History  Problem Relation Age of Onset  . Breast cancer Mother   . Depression Mother   . Heart attack Father   . Breast cancer Sister     Review of Systems: As noted in history of present illness. All other systems were reviewed and are negative.  Physical Exam: BP 110/79  Pulse 75  Ht 5\' 10"  (1.778 m)  Wt 213 lb 1.9 oz (96.671 kg)  BMI 30.58 kg/m2  SpO2 99% He is an  white male in no acute distress. HEENT: Unremarkable No JVD or bruits. Lungs: Clear Cardiovascular: Regular rate and rhythm, normal S1 and S2. Grade 2/6 systolic murmur at apex. Abdomen: Obese,  soft, nontender. No masses or bruits. Extremities: Trace edema below the knees. Large venous varicosities. Skin: Warm and dry Neuro: Alert oriented x3. Cranial nerves II through XII are intact. LABORATORY DATA:     Assessment / Plan: 1. Atrial fibrillation. Rate control has improved with the addition of digoxin. He appears to be asymptomatic. We'll followup with [name]. If rate control is inadequate he could be considered for A-V node ablation.  2. Coronary disease with remote anterior myocardial infarction. He is asymptomatic.  3. Chronic systolic congestive heart failure. He is on optimal therapy with carvedilol, ACE inhibitor, diuretics, and Inspra. Further titration was limited by hypotension. Ejection fraction is 25-30%. Continue sodium restriction. I am very pleased with his efforts at weight loss and he will continue with [employer].

## 2013-10-29 ENCOUNTER — Encounter: Payer: Self-pay | Admitting: Internal Medicine

## 2013-11-18 ENCOUNTER — Telehealth: Payer: Self-pay | Admitting: Internal Medicine

## 2013-11-18 NOTE — Telephone Encounter (Signed)
New message    Have question about getting scheduled transmissions on defib from home.  He has a box but has not heard anything from [place].  He does not have a transmission schedule and want to know why?

## 2013-11-18 NOTE — Telephone Encounter (Signed)
Left message for patient that we will begin remote follow up after his January office visit.

## 2013-12-24 ENCOUNTER — Other Ambulatory Visit: Payer: Self-pay | Admitting: Internal Medicine

## 2014-01-09 ENCOUNTER — Encounter: Payer: Self-pay | Admitting: Internal Medicine

## 2014-01-09 ENCOUNTER — Ambulatory Visit (INDEPENDENT_AMBULATORY_CARE_PROVIDER_SITE_OTHER): Payer: 59 | Admitting: Internal Medicine

## 2014-01-09 VITALS — BP 107/70 | HR 75 | Ht 70.0 in | Wt 207.0 lb

## 2014-01-09 DIAGNOSIS — Z95810 Presence of automatic (implantable) cardiac defibrillator: Secondary | ICD-10-CM

## 2014-01-09 DIAGNOSIS — I4891 Unspecified atrial fibrillation: Secondary | ICD-10-CM

## 2014-01-09 DIAGNOSIS — I509 Heart failure, unspecified: Secondary | ICD-10-CM

## 2014-01-09 DIAGNOSIS — I255 Ischemic cardiomyopathy: Secondary | ICD-10-CM

## 2014-01-09 DIAGNOSIS — I428 Other cardiomyopathies: Secondary | ICD-10-CM

## 2014-01-09 DIAGNOSIS — I2589 Other forms of chronic ischemic heart disease: Secondary | ICD-10-CM

## 2014-01-09 DIAGNOSIS — I5022 Chronic systolic (congestive) heart failure: Secondary | ICD-10-CM

## 2014-01-09 LAB — MDC_IDC_ENUM_SESS_TYPE_INCLINIC
Battery Voltage: 3.03 V
Brady Statistic AP VS Percent: 0 %
Brady Statistic AS VP Percent: 65.2 %
Brady Statistic RA Percent Paced: 0.06 %
Brady Statistic RV Percent Paced: 67.31 %
Date Time Interrogation Session: 20150122120659
HIGH POWER IMPEDANCE MEASURED VALUE: 209 Ohm
HIGH POWER IMPEDANCE MEASURED VALUE: 81 Ohm
HighPow Impedance: 59 Ohm
Lead Channel Impedance Value: 323 Ohm
Lead Channel Impedance Value: 437 Ohm
Lead Channel Impedance Value: 513 Ohm
Lead Channel Impedance Value: 551 Ohm
Lead Channel Sensing Intrinsic Amplitude: 13.25 mV
Lead Channel Sensing Intrinsic Amplitude: 5.5 mV
Lead Channel Setting Pacing Amplitude: 2 V
MDC IDC MSMT BATTERY REMAINING LONGEVITY: 101 mo
MDC IDC MSMT LEADCHNL LV IMPEDANCE VALUE: 722 Ohm
MDC IDC MSMT LEADCHNL RA SENSING INTR AMPL: 1.125 mV
MDC IDC MSMT LEADCHNL RV SENSING INTR AMPL: 14.5 mV
MDC IDC SET LEADCHNL LV PACING AMPLITUDE: 2 V
MDC IDC SET LEADCHNL LV PACING PULSEWIDTH: 0.6 ms
MDC IDC SET LEADCHNL RV PACING PULSEWIDTH: 0.6 ms
MDC IDC SET LEADCHNL RV SENSING SENSITIVITY: 0.3 mV
MDC IDC SET ZONE DETECTION INTERVAL: 340 ms
MDC IDC STAT BRADY AP VP PERCENT: 0.06 %
MDC IDC STAT BRADY AS VS PERCENT: 34.74 %
Zone Setting Detection Interval: 300 ms
Zone Setting Detection Interval: 350 ms
Zone Setting Detection Interval: 400 ms

## 2014-01-09 NOTE — Assessment & Plan Note (Signed)
Chronic DOE but imporved with 50 lb weight loss

## 2014-01-09 NOTE — Patient Instructions (Addendum)
Your physician recommends that you continue on your current medications as directed. Please refer to the Current Medication list given to you today.  Remote monitoring is used to monitor your Pacemaker of ICD from home. This monitoring reduces the number of office visits required to check your device to one time per year. It allows [place] to keep an eye on the functioning of your device to ensure it is working properly. You are scheduled for a device check from home on 04/14/2014. You may send your transmission at any time that day. If you have a wireless device, the transmission will be sent automatically. After your physician reviews your transmission, you will receive a postcard with your next transmission date.  Your physician wants you to follow-up in: 9 months with Dr. Caryl Comes.  You will receive a reminder letter in the mail two months in advance. If you don't receive a letter, please call our office to schedule the follow-up appointment.   Your physician recommends that you schedule a follow-up appointment for new eval with Dr. Rayann Heman for Atrial Fibrillation ablation

## 2014-01-09 NOTE — Progress Notes (Signed)
Patient Care Team: [name], MD as PCP - General (Family Medicine)   HPI  [name] is a 68 y.o. male seen in followup for congestive heart failure in the setting of a mixed cardiomyopathy. He is status post CRT-D for the above. Underwent generator replacement 08/2013 and a  He also has atrial fibrillation previously and was on amio . He has been having more atrial fibrillation of late with rapid rates and Dr. Shirlee More increased his carvedilol at his last visit January 2014 cx by hypotension.    His left atrial dimension 7/14 was 48/2.17.  Dyspnes is stable   Weight loss >50 over 6 months     Past Medical History  Diagnosis Date  . Coronary artery disease   . MI (myocardial infarction)     ANTERIOR  . Ischemic cardiomyopathy     EF 23%  . PAF (paroxysmal atrial fibrillation)   . Chronic renal insufficiency   . Dyslipidemia   . Depression   . ED (erectile dysfunction)   . Other primary cardiomyopathies     Past Surgical History  Procedure Laterality Date  . Cardiac catheterization  02/05/2008    MODERATE TO SEVERE LEFT VENTRICULAR  DYSFUNCTION WITH EF 25-30%  . Coronary stent placement      LAD  . Transthoracic echocardiogram  12/2010    EF 30-35%  . Cardiac defibrillator placement      BI-VENTRICULAR ICD  . Wrist fracture surgery    . Breast lumpectomy    . Cardiac defibrillator placement      Current Outpatient Prescriptions  Medication Sig Dispense Refill  . Ascorbic Acid (VITAMIN C) 250 MG tablet Take 250 mg by mouth daily.        . carvedilol (COREG) 25 MG tablet Take 12.5 mg by mouth 2 (two) times daily with a meal.      . digoxin (LANOXIN) 0.125 MG tablet Take 0.0625 mg by mouth daily.      Marland Kitchen eplerenone (INSPRA) 25 MG tablet Take 1 tablet by mouth  daily  90 tablet  2  . fenofibrate 160 MG tablet Take 1 tablet by mouth  daily  90 tablet  2  . folic acid (FOLVITE) 630 MCG tablet Take 800 mcg by mouth daily.       . furosemide (LASIX) 40 MG  tablet Take 20 mg by mouth daily.       Marland Kitchen lisinopril (PRINIVIL,ZESTRIL) 10 MG tablet Take 5 mg by mouth daily.       Marland Kitchen LORazepam (ATIVAN) 1 MG tablet Take 1 mg by mouth as needed for anxiety.       . Multiple Vitamins-Minerals (MULTIVITAMIN WITH MINERALS) tablet Take 1 tablet by mouth daily.        . nitroGLYCERIN (NITROSTAT) 0.4 MG SL tablet Place 1 tablet (0.4 mg total) under the tongue every 5 (five) minutes as needed.  25 tablet  6  . potassium chloride SA (K-DUR,KLOR-CON) 20 MEQ tablet Take 10 mEq by mouth daily.      . pravastatin (PRAVACHOL) 40 MG tablet Take 40 mg by mouth at bedtime.      . sertraline (ZOLOFT) 100 MG tablet Take 100 mg by mouth daily.        Marland Kitchen warfarin (COUMADIN) 5 MG tablet Take 2.5-5 mg by mouth daily. Take one tablet (5mg ) on Monday and Thursday. Take half-tablet (2.5mg ) on Sunday, Tuesday, Wednesday, Friday, and Saturday.       No current facility-administered  medications for this visit.    No Known Allergies  Review of Systems negative except from HPI and PMH  Physical Exam BP 107/70  Pulse 75  Ht 5\' 10"  (1.778 m)  Wt 207 lb (93.895 kg)  BMI 29.70 kg/m2 Well developed and nourished in no acute distress HENT normal Neck supple with JVP-flat Device pocket well healed; without hematoma or erythema.  There is no tethering Clear Regular rate and rhythm, no murmurs or gallops Abd-soft with active BS No Clubbing cyanosis edema Skin-warm and dry A & Oriented  Grossly normal sensory and motor function     Assessment and  Plan

## 2014-01-09 NOTE — Assessment & Plan Note (Signed)
Very lengthy converstion regarding the options with atrial fibrillation, AVablation  Vs AFib ablation with data showing improved survival with Vpacing >98% pacing vs less and data showing improved survival with Afib ablation v AV node ablation in pts with CHF  His LA dimensions in 7/14 were reasonable for consideration of Afib ablation so we will ask him to see Dr Greggory Brandy and he is agreeable tothis  He is concerned as he was a non responder to CRT  > 50 min

## 2014-01-09 NOTE — Assessment & Plan Note (Signed)
The patient's device was interrogated.  The information was reviewed. No changes were made in the programming.    

## 2014-01-09 NOTE — Assessment & Plan Note (Signed)
Continue meds as above

## 2014-01-21 ENCOUNTER — Other Ambulatory Visit: Payer: Self-pay | Admitting: Cardiology

## 2014-01-23 ENCOUNTER — Ambulatory Visit (INDEPENDENT_AMBULATORY_CARE_PROVIDER_SITE_OTHER): Payer: 59 | Admitting: Internal Medicine

## 2014-01-23 ENCOUNTER — Encounter: Payer: Self-pay | Admitting: Internal Medicine

## 2014-01-23 VITALS — BP 128/68 | HR 76 | Ht 70.0 in | Wt 217.0 lb

## 2014-01-23 DIAGNOSIS — I509 Heart failure, unspecified: Secondary | ICD-10-CM

## 2014-01-23 DIAGNOSIS — I4891 Unspecified atrial fibrillation: Secondary | ICD-10-CM

## 2014-01-23 DIAGNOSIS — I5022 Chronic systolic (congestive) heart failure: Secondary | ICD-10-CM

## 2014-01-23 DIAGNOSIS — Z95810 Presence of automatic (implantable) cardiac defibrillator: Secondary | ICD-10-CM

## 2014-01-23 NOTE — Patient Instructions (Signed)
Your physician recommends that you schedule a follow-up appointment with Dr Caryl Comes as scheduled

## 2014-01-23 NOTE — Progress Notes (Signed)
Primary Care Physician: [name], MD Referring Physician:  Dr Caryl Comes Primary Cardiologist:  Dr Martinique  [name] is a 68 y.o. male with a h/o cad, ischemic CM, and persistent afib.  He reports having afib for several years.  He is mostly unaware of his afib.  He has occasional fluttering.  He has had difficulty with heart failure and is s/p BiV ICD.  He also has chronic renal disease.  He describes his overall health state as "fair".  He has decreased exercise tolerance.  He is SOB with moderate activity.  One flight of steps is about his limit.   He is concerned that he has not had significant improvement with medical therapy or CRT.  He has failed medical therapy with amiodarone. Today, he denies symptoms of chest pain, orthopnea, PND,  dizziness, presyncope, syncope, or neurologic sequela.  He has mild BLE edema.  The patient is tolerating medications without difficulties and is otherwise without complaint today.   Past Medical History  Diagnosis Date  . Coronary artery disease   . MI (myocardial infarction) 2000    ANTERIOR, s/p PCI  . Ischemic cardiomyopathy     EF 23%  . Persistent atrial fibrillation     on coumadin  . Chronic renal insufficiency   . Dyslipidemia   . Depression   . ED (erectile dysfunction)   . Other primary cardiomyopathies    Past Surgical History  Procedure Laterality Date  . Cardiac catheterization  02/05/2008    MODERATE TO SEVERE LEFT VENTRICULAR  DYSFUNCTION WITH EF 25-30%  . Coronary stent placement      LAD  . Transthoracic echocardiogram  12/2010    EF 30-35%  . Cardiac defibrillator placement  2009    MDT BI-VENTRICULAR ICD by Dr Caryl Comes  . Wrist fracture surgery    . Breast lumpectomy    . Implantable cardioverter defibrillator generator change  2014    Current Outpatient Prescriptions  Medication Sig Dispense Refill  . Ascorbic Acid (VITAMIN C) 250 MG tablet Take 250 mg by mouth daily.        . carvedilol (COREG) 25 MG tablet Take  12.5 mg by mouth 2 (two) times daily with a meal.      . digoxin (LANOXIN) 0.125 MG tablet Take 0.0625 mg by mouth daily.      Marland Kitchen eplerenone (INSPRA) 25 MG tablet Take 1 tablet by mouth  daily  90 tablet  2  . fenofibrate 160 MG tablet Take 1 tablet by mouth  daily  90 tablet  2  . folic acid (FOLVITE) 244 MCG tablet Take 800 mcg by mouth daily.       . furosemide (LASIX) 40 MG tablet Take 20 mg by mouth daily.       Marland Kitchen lisinopril (PRINIVIL,ZESTRIL) 10 MG tablet Take 5 mg by mouth daily.       Marland Kitchen LORazepam (ATIVAN) 1 MG tablet Take 1 mg by mouth as needed for anxiety.       . Multiple Vitamins-Minerals (MULTIVITAMIN WITH MINERALS) tablet Take 1 tablet by mouth daily.        . nitroGLYCERIN (NITROSTAT) 0.4 MG SL tablet Place 1 tablet (0.4 mg total) under the tongue every 5 (five) minutes as needed.  25 tablet  6  . potassium chloride SA (K-DUR,KLOR-CON) 20 MEQ tablet Take 10 mEq by mouth daily.      . pravastatin (PRAVACHOL) 40 MG tablet Take 1 tablet by mouth  every night at bedtime  90  tablet  1  . sertraline (ZOLOFT) 100 MG tablet Take 100 mg by mouth daily.        Marland Kitchen warfarin (COUMADIN) 5 MG tablet Take 2.5-5 mg by mouth daily. Take one tablet (5mg ) on Monday and Thursday. Take half-tablet (2.5mg ) on Sunday, Tuesday, Wednesday, Friday, and Saturday.       No current facility-administered medications for this visit.    No Known Allergies  History   Social History  . Marital Status: Married    Spouse Name: N/A    Number of Children: 2  . Years of Education: N/A   Occupational History  . disabled    Social History Main Topics  . Smoking status: Former Smoker    Types: Cigarettes    Quit date: 12/23/1998  . Smokeless tobacco: Not on file  . Alcohol Use: No  . Drug Use: No  . Sexual Activity: Not on file   Other Topics Concern  . Not on file   Social History Narrative   Lives in [place].  Retired Teacher, English as a foreign language          Family History  Problem Relation Age of Onset    . Breast cancer Mother   . Depression Mother   . Heart attack Father   . Breast cancer Sister     ROS- All systems are reviewed and negative except as per the HPI above  Physical Exam: Filed Vitals:   01/23/14 1535  BP: 128/68  Pulse: 76  Height: 5\' 10"  (1.778 m)  Weight: 217 lb (98.431 kg)    GEN- The patient is well appearing, alert and oriented x 3 today.   Head- normocephalic, atraumatic Eyes-  Sclera clear, conjunctiva pink Ears- hearing intact Oropharynx- clear Neck- supple, Lungs- Clear to ausculation bilaterally, normal work of breathing Heart- irregular rate and rhythm  GI- soft, NT, ND, + BS Extremities- no clubbing, cyanosis, +1 chronic edema MS- no significant deformity or atrophy Skin- no rash or lesion Psych- euthymic mood, full affect Neuro- strength and sensation are intact  EKGs and device interrogations are reviewed extensively Dr Aquilla Hacker notes are reviewed  Additional epic records are reviewed  Assessment and Plan:   1. Persistent afib/ chronic systolic dysfunction The patient has medicine refractory persistent afib.  He has advanced structural heart disease including an LVEDD of 71 and LA size of 34mm.  He appears to be asymptomatic with afib.  Upon review of notes, one that particularly stands out is Dr Lanny Hurst note from 01/04/12.  At that time, he was in sinus and biv pacing > 97%.  Dr Acie Fredrickson reports that the patient had SOB and was "frustrated with the fact that does not feel any better".  This would be the exact same description of the patient today and leads me to believe that I would not likely improve his symptoms with sinus or additional biv pacing presently. The patient does indeed seem frustrated with his overall health and this has been the case for at least several years.  He seems somewhat sceptical of medical care.  I do not think for many reasons that he would be a good candidate for catheter ablation.  Though AV nodal ablation may help with  increasing his % BiV pacing, I am not convinced that he would personally do better with this.  I will defer this decision to Dr Caryl Comes. The patient will follow-up with Dr Caryl Comes for further discussion.

## 2014-02-14 ENCOUNTER — Telehealth: Payer: Self-pay | Admitting: Internal Medicine

## 2014-02-14 NOTE — Telephone Encounter (Signed)
Per Dr Jackalyn Lombard note needs to have a discussion with Dr Caryl Comes about AV node ablation

## 2014-02-14 NOTE — Telephone Encounter (Signed)
New Message  Pt called. Requests a call back to discuss moving forward with ablation// Please call back to discuss.

## 2014-02-17 NOTE — Telephone Encounter (Signed)
Informed pt that I am looking into this and will be in touch with him once I discuss with Dr. Caryl Comes. Pt agreeable to plan.

## 2014-02-21 ENCOUNTER — Other Ambulatory Visit: Payer: Self-pay | Admitting: *Deleted

## 2014-02-21 MED ORDER — CARVEDILOL 25 MG PO TABS: 12.5000 mg | ORAL_TABLET | Freq: Two times a day (BID) | ORAL | Status: DC

## 2014-02-27 ENCOUNTER — Telehealth: Payer: Self-pay | Admitting: Internal Medicine

## 2014-02-27 ENCOUNTER — Other Ambulatory Visit: Payer: Self-pay | Admitting: *Deleted

## 2014-02-27 MED ORDER — CARVEDILOL 25 MG PO TABS: 12.5000 mg | ORAL_TABLET | Freq: Two times a day (BID) | ORAL | Status: DC

## 2014-02-27 NOTE — Telephone Encounter (Signed)
Called pt to set up fu with klein , pt has questions re what the appointment will entail, was here to see klein 1-22, and allred 2-5 , wasn't sure what all would be done, I told him it looked like a discussion to talk about next step for tx of his a-fib, he was also worried re insurance not paying for all these visits, I checked with Pam in billing and as long as Dr. Caryl Comes has documented ov notes and a diagnosis insurance will cover these visits. Pt aware Sherri out Thursday and will wait for a call back Friday.

## 2014-02-28 NOTE — Telephone Encounter (Signed)
Pt more concerned with making sure insurance will cover all these recent doctor visits - I explained what Pam from billing stated. Pt verbalized understanding   Late entry - pt & discussed need for f/u w/ Caryl Comes earlier this week via phone call.  Pt understandable of need for f/u

## 2014-03-03 ENCOUNTER — Encounter: Payer: Self-pay | Admitting: Internal Medicine

## 2014-03-03 ENCOUNTER — Ambulatory Visit (INDEPENDENT_AMBULATORY_CARE_PROVIDER_SITE_OTHER): Payer: 59 | Admitting: Internal Medicine

## 2014-03-03 VITALS — BP 111/72 | HR 76 | Ht 70.0 in | Wt 211.0 lb

## 2014-03-03 DIAGNOSIS — I509 Heart failure, unspecified: Secondary | ICD-10-CM

## 2014-03-03 DIAGNOSIS — I5022 Chronic systolic (congestive) heart failure: Secondary | ICD-10-CM

## 2014-03-03 DIAGNOSIS — I4891 Unspecified atrial fibrillation: Secondary | ICD-10-CM

## 2014-03-03 DIAGNOSIS — I428 Other cardiomyopathies: Secondary | ICD-10-CM

## 2014-03-03 LAB — MDC_IDC_ENUM_SESS_TYPE_INCLINIC
Battery Remaining Longevity: 97 mo
Brady Statistic AP VP Percent: 0 %
Brady Statistic AP VS Percent: 0 %
Brady Statistic AS VP Percent: 63.91 %
Brady Statistic AS VS Percent: 36.09 %
Brady Statistic RA Percent Paced: 0 %
Brady Statistic RV Percent Paced: 67.23 %
Date Time Interrogation Session: 20150316093747
HIGH POWER IMPEDANCE MEASURED VALUE: 209 Ohm
HighPow Impedance: 57 Ohm
HighPow Impedance: 83 Ohm
Lead Channel Impedance Value: 342 Ohm
Lead Channel Impedance Value: 551 Ohm
Lead Channel Impedance Value: 551 Ohm
Lead Channel Impedance Value: 779 Ohm
Lead Channel Pacing Threshold Pulse Width: 0.6 ms
Lead Channel Sensing Intrinsic Amplitude: 2.625 mV
Lead Channel Setting Pacing Amplitude: 2.5 V
Lead Channel Setting Pacing Pulse Width: 0.6 ms
MDC IDC MSMT BATTERY VOLTAGE: 3 V
MDC IDC MSMT LEADCHNL LV PACING THRESHOLD AMPLITUDE: 0.75 V
MDC IDC MSMT LEADCHNL LV PACING THRESHOLD PULSEWIDTH: 0.6 ms
MDC IDC MSMT LEADCHNL RV IMPEDANCE VALUE: 437 Ohm
MDC IDC MSMT LEADCHNL RV PACING THRESHOLD AMPLITUDE: 0.75 V
MDC IDC MSMT LEADCHNL RV SENSING INTR AMPL: 11 mV
MDC IDC SET LEADCHNL LV PACING AMPLITUDE: 2 V
MDC IDC SET LEADCHNL RV PACING PULSEWIDTH: 0.6 ms
MDC IDC SET LEADCHNL RV SENSING SENSITIVITY: 0.3 mV
Zone Setting Detection Interval: 300 ms
Zone Setting Detection Interval: 340 ms
Zone Setting Detection Interval: 350 ms
Zone Setting Detection Interval: 400 ms

## 2014-03-03 NOTE — Patient Instructions (Signed)
Your physician has requested that you have an exercise tolerance test. For further information please visit [email or web address]. Please also follow instruction sheet, as given.  Your physician recommends that you continue on your current medications as directed. Please refer to the Current Medication list given to you today.  Remote monitoring is used to monitor your Pacemaker of ICD from home. This monitoring reduces the number of office visits required to check your device to one time per year. It allows [place] to keep an eye on the functioning of your device to ensure it is working properly. You are scheduled for a device check from home on 06/04/14. You may send your transmission at any time that day. If you have a wireless device, the transmission will be sent automatically. After your physician reviews your transmission, you will receive a postcard with your next transmission date.  Your physician wants you to follow-up in: 1 year with Dr. Caryl Comes.  You will receive a reminder letter in the mail two months in advance. If you don't receive a letter, please call our office to schedule the follow-up appointment.

## 2014-03-03 NOTE — Progress Notes (Signed)
Patient Care Team: [name], MD as PCP - General (Family Medicine)   HPI  [name] is a 68 y.o. male Seen in followup of afib for which he recently saw Dr Greggory Brandy and there was a decision to not pursue PVI He also has ischemic cardiomyopathy and HFrEF with prior CRT-D from which he garnered little benefit. CXR demonstrates good lead location  He struggles with depression   It is not clear that he had his symptoms are related to his atrial fibrillation. Clearly he has no palpitations. Is also not certain that he can recall previously when his atrial fibrillation is paroxysmal that his symptoms were worse while in AF.  Past Medical History  Diagnosis Date  . Coronary artery disease   . MI (myocardial infarction) 2000    ANTERIOR, s/p PCI  . Ischemic cardiomyopathy     EF 23%  . Persistent atrial fibrillation     on coumadin  . Chronic renal insufficiency   . Dyslipidemia   . Depression   . ED (erectile dysfunction)   . Other primary cardiomyopathies     Past Surgical History  Procedure Laterality Date  . Cardiac catheterization  02/05/2008    MODERATE TO SEVERE LEFT VENTRICULAR  DYSFUNCTION WITH EF 25-30%  . Coronary stent placement      LAD  . Transthoracic echocardiogram  12/2010    EF 30-35%  . Cardiac defibrillator placement  2009    MDT BI-VENTRICULAR ICD by Dr Caryl Comes  . Wrist fracture surgery    . Breast lumpectomy    . Implantable cardioverter defibrillator generator change  2014    Current Outpatient Prescriptions  Medication Sig Dispense Refill  . Ascorbic Acid (VITAMIN C) 250 MG tablet Take 250 mg by mouth daily.        . carvedilol (COREG) 25 MG tablet Take 0.5 tablets (12.5 mg total) by mouth 2 (two) times daily with a meal.  90 tablet  0  . digoxin (LANOXIN) 0.125 MG tablet Take 0.0625 mg by mouth daily.      Marland Kitchen eplerenone (INSPRA) 25 MG tablet Take 1 tablet by mouth  daily  90 tablet  2  . fenofibrate 160 MG tablet Take 1 tablet by mouth   daily  90 tablet  2  . folic acid (FOLVITE) 989 MCG tablet Take 800 mcg by mouth daily.       . furosemide (LASIX) 40 MG tablet Take 20 mg by mouth daily.       Marland Kitchen lisinopril (PRINIVIL,ZESTRIL) 10 MG tablet Take 5 mg by mouth daily.       Marland Kitchen LORazepam (ATIVAN) 1 MG tablet Take 1 mg by mouth as needed for anxiety.       . Multiple Vitamins-Minerals (MULTIVITAMIN WITH MINERALS) tablet Take 1 tablet by mouth daily.        . nitroGLYCERIN (NITROSTAT) 0.4 MG SL tablet Place 1 tablet (0.4 mg total) under the tongue every 5 (five) minutes as needed.  25 tablet  6  . potassium chloride SA (K-DUR,KLOR-CON) 20 MEQ tablet Take 10 mEq by mouth daily.      . pravastatin (PRAVACHOL) 40 MG tablet Take 1 tablet by mouth  every night at bedtime  90 tablet  1  . sertraline (ZOLOFT) 100 MG tablet Take 100 mg by mouth daily.        Marland Kitchen warfarin (COUMADIN) 5 MG tablet Take 2.5-5 mg by mouth daily. Take one tablet (5mg ) on Monday, Wednesday,  and Friday. Take half-tablet (2.5mg ) on Tuesday, Thursday, Saturday, and Sunday.       No current facility-administered medications for this visit.    No Known Allergies  Review of Systems negative except from HPI and PMH  Physical Exam BP 111/72  Pulse 76  Ht 5\' 10"  (1.778 m)  Wt 211 lb (95.709 kg)  BMI 30.28 kg/m2 Well developed and nourished in no acute distress HENT normal Neck supple with JVP-flat Carotids brisk and full without bruits Clear Irregularly irregular rate and rhythm with controlled ventricular response, no murmurs or gallops Abd-soft with active BS without hepatomegaly No Clubbing cyanosis edema Skin-warm and dry A & Oriented  Grossly normal sensory and motor function    Assessment and  Plan  Atrial fibrillation   Congestive heart failure-chronic-systolic  Ischemic cardiomyopathy  CRT-D.  Depression  We spent about 1 hr with him and wife discussing various issues related to atrial fibrillation and his symptoms of dyspnea on exertion  which are limiting. We went over some of these points multiple times. He always asked good questions. He asked why he needed another ECG today. I told him we could use the last 1.  At the end, the point that I was trying to make was that one target of therapy is atrial fibrillation with a poorly controlled ventricular rate. His histograms would not suggest this but his symptoms are so limiting it is worth considering. Treadmill testing I think is the best choice for this and again he was agreeable to proceeding with this.  Atrial fibrillation is persistent and atrial dimension 48/2.2 in 7/14

## 2014-03-27 ENCOUNTER — Telehealth (HOSPITAL_COMMUNITY): Payer: Self-pay

## 2014-04-01 ENCOUNTER — Ambulatory Visit (HOSPITAL_COMMUNITY)
Admission: RE | Admit: 2014-04-01 | Discharge: 2014-04-01 | Disposition: A | Discharge status: Home / Self Care | Payer: 59 | Source: Ambulatory Visit | Attending: Cardiovascular Disease | Admitting: Cardiovascular Disease

## 2014-04-01 DIAGNOSIS — I4891 Unspecified atrial fibrillation: Secondary | ICD-10-CM | POA: Insufficient documentation

## 2014-04-01 DIAGNOSIS — I5022 Chronic systolic (congestive) heart failure: Secondary | ICD-10-CM

## 2014-04-01 DIAGNOSIS — I509 Heart failure, unspecified: Secondary | ICD-10-CM

## 2014-04-01 DIAGNOSIS — I428 Other cardiomyopathies: Secondary | ICD-10-CM | POA: Insufficient documentation

## 2014-04-17 ENCOUNTER — Telehealth: Payer: Self-pay | Admitting: Internal Medicine

## 2014-04-17 NOTE — Telephone Encounter (Signed)
New message     Want stress test results.  Pt went to [facility] to have this.  OK to leave msg on vm

## 2014-04-21 ENCOUNTER — Other Ambulatory Visit: Payer: Self-pay | Admitting: Cardiology

## 2014-04-23 NOTE — Telephone Encounter (Signed)
Informed pt that stress test showed a fast rate with exercise and Dr. Caryl Comes spoke with Dr. Martinique about this. Dr. Martinique will address this with patient at office visit 5/15. Patient verbalized understanding and agreeable to plan.

## 2014-05-02 ENCOUNTER — Ambulatory Visit (INDEPENDENT_AMBULATORY_CARE_PROVIDER_SITE_OTHER): Payer: 59 | Admitting: Cardiology

## 2014-05-02 ENCOUNTER — Encounter: Payer: Self-pay | Admitting: Cardiology

## 2014-05-02 VITALS — BP 110/70 | HR 72 | Ht 70.0 in | Wt 211.0 lb

## 2014-05-02 DIAGNOSIS — E785 Hyperlipidemia, unspecified: Secondary | ICD-10-CM

## 2014-05-02 DIAGNOSIS — I5022 Chronic systolic (congestive) heart failure: Secondary | ICD-10-CM

## 2014-05-02 DIAGNOSIS — I10 Essential (primary) hypertension: Secondary | ICD-10-CM

## 2014-05-02 DIAGNOSIS — I4891 Unspecified atrial fibrillation: Secondary | ICD-10-CM

## 2014-05-02 DIAGNOSIS — I2510 Atherosclerotic heart disease of native coronary artery without angina pectoris: Secondary | ICD-10-CM

## 2014-05-02 DIAGNOSIS — I509 Heart failure, unspecified: Secondary | ICD-10-CM

## 2014-05-02 NOTE — Progress Notes (Signed)
[name] Date of Birth: [birth year] Medical Record #[id number]  History of Present Illness: [name] is seen today for followup.  He has a history of remote anterior myocardial infarction with an ischemic cardiomyopathy and ejection fraction of 23%. He is status post CRT D. He had a generator replacement in September 2014. He also has a history of atrial fibrillation. He is intolerant of amiodarone. His QT interval is too long for Tikosyn. He previously had attempted to increase his carvedilol dose but he developed significant hypotension. He is on Lanoxin for rate control. His last defibrillator check demonstrated that he was paced two thirds of the time and had intrinsic conduction one third of the time. He reports he is doing very well.  His activity level is still fairly low. He denies any dyspnea, chest pain, edema, or palpitations. He does report feeling lightheaded with activity. Recent stress test results noted below.  Current Outpatient Prescriptions on File Prior to Visit  Medication Sig Dispense Refill  . Ascorbic Acid (VITAMIN C) 250 MG tablet Take 250 mg by mouth daily.        . carvedilol (COREG) 25 MG tablet Take 0.5 tablets (12.5 mg total) by mouth 2 (two) times daily with a meal.  90 tablet  0  . digoxin (LANOXIN) 0.125 MG tablet Take 0.0625 mg by mouth daily.      Marland Kitchen eplerenone (INSPRA) 25 MG tablet Take 1 tablet by mouth  daily  90 tablet  2  . fenofibrate 160 MG tablet Take 1 tablet by mouth  daily  90 tablet  2  . folic acid (FOLVITE) 956 MCG tablet Take 800 mcg by mouth daily.       . furosemide (LASIX) 40 MG tablet Take 20 mg by mouth daily.       . furosemide (LASIX) 40 MG tablet Take 0.5 tablets (20 mg total) by mouth daily.  15 tablet  0  . lisinopril (PRINIVIL,ZESTRIL) 10 MG tablet Take 5 mg by mouth daily.       Marland Kitchen LORazepam (ATIVAN) 1 MG tablet Take 1 mg by mouth as needed for anxiety.       . Multiple Vitamins-Minerals (MULTIVITAMIN WITH MINERALS) tablet Take 1  tablet by mouth daily.        . nitroGLYCERIN (NITROSTAT) 0.4 MG SL tablet Place 1 tablet (0.4 mg total) under the tongue every 5 (five) minutes as needed.  25 tablet  6  . potassium chloride SA (K-DUR,KLOR-CON) 20 MEQ tablet Take 10 mEq by mouth daily.      . pravastatin (PRAVACHOL) 40 MG tablet Take 1 tablet by mouth  every night at bedtime  90 tablet  1  . sertraline (ZOLOFT) 100 MG tablet Take 100 mg by mouth daily.        Marland Kitchen warfarin (COUMADIN) 5 MG tablet Take 2.5-5 mg by mouth daily. Take one tablet (5mg ) on Monday, Wednesday, and Friday. Take half-tablet (2.5mg ) on Tuesday, Thursday, Saturday, and Sunday.       No current facility-administered medications on file prior to visit.    No Known Allergies  Past Medical History  Diagnosis Date  . Coronary artery disease   . MI (myocardial infarction) 2000    ANTERIOR, s/p PCI  . Ischemic cardiomyopathy     EF 23%  . Persistent atrial fibrillation     on coumadin  . Chronic renal insufficiency   . Dyslipidemia   . Depression   . ED (erectile dysfunction)   .  Other primary cardiomyopathies     Past Surgical History  Procedure Laterality Date  . Cardiac catheterization  02/05/2008    MODERATE TO SEVERE LEFT VENTRICULAR  DYSFUNCTION WITH EF 25-30%  . Coronary stent placement      LAD  . Transthoracic echocardiogram  12/2010    EF 30-35%  . Cardiac defibrillator placement  2009    MDT BI-VENTRICULAR ICD by Dr [name]  . Wrist fracture surgery    . Breast lumpectomy    . Implantable cardioverter defibrillator generator change  2014    History  Smoking status  . Former Smoker  . Types: Cigarettes  . Quit date: 12/23/1998  Smokeless tobacco  . Not on file    History  Alcohol Use No    Family History  Problem Relation Age of Onset  . Breast cancer Mother   . Depression Mother   . Heart attack Father   . Breast cancer Sister     Review of Systems: As noted in history of present illness. All other systems were  reviewed and are negative.  Physical Exam: BP 110/70  Pulse 72  Ht 5\' 10"  (1.778 m)  Wt 211 lb (95.709 kg)  BMI 30.28 kg/m2 He is an overweight white male in no acute distress. HEENT: Unremarkable No JVD or bruits. Lungs: Clear Cardiovascular: Regular rate and rhythm, normal S1 and S2. Grade 2/6 systolic murmur at apex. Abdomen: Obese, soft, nontender. No masses or bruits. Extremities: Trace edema below the knees. Large venous varicosities. Skin: Warm and dry Neuro: Alert oriented x3. Cranial nerves II through XII are intact.  LABORATORY DATA:  EXERCISE STRESS TEST REPORT [name], [name] DOB: [id number] Patient ID: [id number] Age: 68 yr Height: 1 in Gender: Male Weight: 211 lb Race: Caucasian Study Date: 01-Apr-2014 Referring Physician: [name] Test Type: ETT Attending Physician: -- Protocol: CVN-BRUCE Technician: [name] Medications: -- Medical History: AICD, ISCHEMIC CARDIOMYOPATHY, MI, CAD, PTCAS, AF, LIPIDS, HTN, HX. SMOKING Reason for Exercise Test: DOE Exercise Test Summary Phase Stage Time Speed Grade HR BP Comment Name Name in Stage (mph) (%) (bpm) (mmHg) PRETEST STANDING 04:36 0.0 0.0 111 119/89 WARM-UP 00:27 1.0 0.0 109 Exercise STAGE 1 03:00 1.7 10.0 153 114/66 STAGE 2 01:02 2.5 12.0 164 Recovery 06:13 0.0 0.0 97 117/66 The patient exercised according to the CVN-BRUCE for 04:01 min:s, achieving a work level of Max. METS: 5.8. The resting heart rate of 95 bpm rose to a maximal heart rate of 176 bpm. This value represents 114 % of the maximal, age-predicted heart rate. The resting blood pressure of 119/89 mmHg , rose to a maximum blood pressure of 133/69 mmHg. The exercise test was stopped due to FATIGUE, SOB. Interpretation Summary: Functional Capacity: Decreased (> 40%). HR Response to Exercise: Exaggerated Overall HR Response To Exercise. BP Response to Exercise: Normal Resting BP with Appropriate Response. Chest Pain: No  Chest Pain. Arrhythmias: Arrhythmias Remain During Exercise. ST Changes: NONDIAGNOSTIC D/T BASELINE EKG. Conclusions Baseline ECG show atrial fibrillation, nonspecific IVCD and triggered BiV pacing. Overall BiV pacing percentage is low. Heart rate response during exercise is exaggerated with complete loss of BiV pacing after only 2 minutes in the first stage of the Bruce protocol ECG stress test is uninterpretable for ischemia due to baseline major IVCD. Confirmed by Sallyanne Kuster, MD, [facility] [phone]) on 04/01/2014 6:40:48 PM Also confirmed by Sallyanne Kuster, MD, [place] [phone]) on 04/01/2014 6:42:03 PM   Assessment / Plan: 1. Atrial fibrillation. Rate control at rest is adequate but poor  with activity. HR with Afib increased to 153 bpm within one minute of exercise. He will not tolerate additional coreg as noted above. Increasing digoxin is not likely to impact his exercise HR. Really no medical options. We discussed the possibility of AV node ablation. The advantage would be to increase his BiV pacing which may equate with improvement in CHF. It is unclear whether he would actually feel better. He is interested in considering this and after extended discussion we will refer him back to Dr. Caryl [name] to consider.   2. Coronary disease with remote anterior myocardial infarction. He is asymptomatic.  3. Chronic systolic congestive heart failure. He is on optimal therapy with carvedilol, ACE inhibitor, diuretics, and Inspra. Further titration was limited by hypotension. Ejection fraction is 25-30%. Continue sodium restriction. I am very pleased with his efforts at weight loss and he will continue with [employer].

## 2014-05-02 NOTE — Patient Instructions (Signed)
Continue your current therapy.  We will schedule follow up with Dr. Caryl Comes   I will see you in 6 months.

## 2014-05-14 ENCOUNTER — Encounter: Payer: Self-pay | Admitting: Internal Medicine

## 2014-05-14 ENCOUNTER — Other Ambulatory Visit: Payer: Self-pay | Admitting: Internal Medicine

## 2014-06-04 ENCOUNTER — Ambulatory Visit (INDEPENDENT_AMBULATORY_CARE_PROVIDER_SITE_OTHER): Payer: 59 | Admitting: *Deleted

## 2014-06-04 ENCOUNTER — Encounter: Payer: Self-pay | Admitting: Internal Medicine

## 2014-06-04 DIAGNOSIS — I5022 Chronic systolic (congestive) heart failure: Secondary | ICD-10-CM

## 2014-06-04 DIAGNOSIS — I428 Other cardiomyopathies: Secondary | ICD-10-CM

## 2014-06-04 DIAGNOSIS — I509 Heart failure, unspecified: Secondary | ICD-10-CM

## 2014-06-04 NOTE — Progress Notes (Signed)
Remote ICD transmission.   

## 2014-06-09 LAB — MDC_IDC_ENUM_SESS_TYPE_REMOTE
Battery Remaining Longevity: 91 mo
Brady Statistic AS VP Percent: 64 %
Brady Statistic AS VS Percent: 36 %
HighPow Impedance: 51 Ohm
Lead Channel Impedance Value: 380 Ohm
Lead Channel Impedance Value: 703 Ohm
Lead Channel Pacing Threshold Pulse Width: 0.6 ms
Lead Channel Sensing Intrinsic Amplitude: 1.5 mV
Lead Channel Setting Pacing Amplitude: 2.5 V
Lead Channel Setting Pacing Pulse Width: 0.6 ms
MDC IDC MSMT LEADCHNL LV PACING THRESHOLD AMPLITUDE: 0.625 V
MDC IDC MSMT LEADCHNL RV SENSING INTR AMPL: 9.9 mV
MDC IDC SET LEADCHNL LV PACING AMPLITUDE: 2 V
MDC IDC SET LEADCHNL RV PACING PULSEWIDTH: 0.6 ms
MDC IDC SET LEADCHNL RV SENSING SENSITIVITY: 0.3 mV
Zone Setting Detection Interval: 300 ms
Zone Setting Detection Interval: 340 ms
Zone Setting Detection Interval: 350 ms
Zone Setting Detection Interval: 400 ms

## 2014-06-14 ENCOUNTER — Other Ambulatory Visit: Payer: Self-pay | Admitting: Cardiology

## 2014-06-18 ENCOUNTER — Other Ambulatory Visit: Payer: Self-pay | Admitting: Cardiology

## 2014-06-18 ENCOUNTER — Other Ambulatory Visit: Payer: Self-pay | Admitting: Internal Medicine

## 2014-06-18 NOTE — Telephone Encounter (Signed)
Encounter complete. 

## 2014-06-24 ENCOUNTER — Encounter: Payer: Self-pay | Admitting: Cardiology

## 2014-07-08 ENCOUNTER — Telehealth: Payer: Self-pay | Admitting: *Deleted

## 2014-07-09 NOTE — Telephone Encounter (Signed)
Pt still have not received his Lisnopril #90.Please call today,he is completely out of it. Please call to [phone].

## 2014-07-10 ENCOUNTER — Other Ambulatory Visit: Payer: Self-pay

## 2014-07-10 MED ORDER — LISINOPRIL 10 MG PO TABS: 5.0000 mg | ORAL_TABLET | Freq: Every day | ORAL | Status: DC

## 2014-07-11 ENCOUNTER — Other Ambulatory Visit: Payer: Self-pay

## 2014-07-11 MED ORDER — FUROSEMIDE 40 MG PO TABS: 20.0000 mg | ORAL_TABLET | Freq: Every day | ORAL | Status: DC

## 2014-07-14 ENCOUNTER — Other Ambulatory Visit: Payer: Self-pay

## 2014-07-14 MED ORDER — FUROSEMIDE 40 MG PO TABS: 20.0000 mg | ORAL_TABLET | Freq: Every day | ORAL | Status: DC

## 2014-08-11 ENCOUNTER — Other Ambulatory Visit: Payer: Self-pay | Admitting: Cardiology

## 2014-08-15 ENCOUNTER — Other Ambulatory Visit: Payer: Self-pay

## 2014-08-15 ENCOUNTER — Other Ambulatory Visit: Payer: Self-pay | Admitting: Internal Medicine

## 2014-08-15 MED ORDER — DIGOXIN 125 MCG PO TABS: 0.0625 mg | ORAL_TABLET | Freq: Every day | ORAL | Status: DC

## 2014-08-15 MED ORDER — LISINOPRIL 10 MG PO TABS: 5.0000 mg | ORAL_TABLET | Freq: Every day | ORAL | Status: DC

## 2014-09-08 ENCOUNTER — Encounter: Payer: Self-pay | Admitting: Internal Medicine

## 2014-09-08 ENCOUNTER — Ambulatory Visit (INDEPENDENT_AMBULATORY_CARE_PROVIDER_SITE_OTHER): Payer: 59 | Admitting: *Deleted

## 2014-09-08 DIAGNOSIS — I428 Other cardiomyopathies: Secondary | ICD-10-CM

## 2014-09-08 DIAGNOSIS — I509 Heart failure, unspecified: Secondary | ICD-10-CM

## 2014-09-08 DIAGNOSIS — I5022 Chronic systolic (congestive) heart failure: Secondary | ICD-10-CM

## 2014-09-08 LAB — MDC_IDC_ENUM_SESS_TYPE_REMOTE
Battery Remaining Longevity: 89 mo
Battery Voltage: 2.99 V
Brady Statistic AP VP Percent: 0 %
Brady Statistic AS VS Percent: 35.92 %
Date Time Interrogation Session: 20150921084225
HIGH POWER IMPEDANCE MEASURED VALUE: 48 Ohm
HighPow Impedance: 171 Ohm
HighPow Impedance: 54 Ohm
Lead Channel Impedance Value: 380 Ohm
Lead Channel Impedance Value: 456 Ohm
Lead Channel Impedance Value: 494 Ohm
Lead Channel Pacing Threshold Amplitude: 0.75 V
Lead Channel Pacing Threshold Pulse Width: 0.6 ms
Lead Channel Sensing Intrinsic Amplitude: 1.25 mV
Lead Channel Sensing Intrinsic Amplitude: 1.25 mV
Lead Channel Sensing Intrinsic Amplitude: 9.5 mV
Lead Channel Sensing Intrinsic Amplitude: 9.5 mV
Lead Channel Setting Pacing Amplitude: 1.75 V
Lead Channel Setting Pacing Pulse Width: 0.6 ms
Lead Channel Setting Sensing Sensitivity: 0.3 mV
MDC IDC MSMT LEADCHNL LV IMPEDANCE VALUE: 285 Ohm
MDC IDC MSMT LEADCHNL LV IMPEDANCE VALUE: 665 Ohm
MDC IDC SET LEADCHNL LV PACING PULSEWIDTH: 0.6 ms
MDC IDC SET LEADCHNL RV PACING AMPLITUDE: 2.5 V
MDC IDC SET ZONE DETECTION INTERVAL: 340 ms
MDC IDC SET ZONE DETECTION INTERVAL: 350 ms
MDC IDC SET ZONE DETECTION INTERVAL: 400 ms
MDC IDC STAT BRADY AP VS PERCENT: 0 %
MDC IDC STAT BRADY AS VP PERCENT: 64.08 %
MDC IDC STAT BRADY RA PERCENT PACED: 0 %
MDC IDC STAT BRADY RV PERCENT PACED: 67.47 %
Zone Setting Detection Interval: 300 ms

## 2014-09-08 NOTE — Progress Notes (Signed)
Remote ICD transmission.   

## 2014-09-22 ENCOUNTER — Encounter: Payer: Self-pay | Admitting: Cardiology

## 2014-09-29 ENCOUNTER — Other Ambulatory Visit: Payer: Self-pay | Admitting: Cardiology

## 2014-11-07 ENCOUNTER — Encounter: Payer: Self-pay | Admitting: Cardiology

## 2014-11-07 ENCOUNTER — Ambulatory Visit (INDEPENDENT_AMBULATORY_CARE_PROVIDER_SITE_OTHER): Payer: 59 | Admitting: Cardiology

## 2014-11-07 VITALS — BP 110/75 | HR 69 | Ht 70.0 in | Wt 214.9 lb

## 2014-11-07 DIAGNOSIS — I10 Essential (primary) hypertension: Secondary | ICD-10-CM

## 2014-11-07 DIAGNOSIS — I480 Paroxysmal atrial fibrillation: Secondary | ICD-10-CM

## 2014-11-07 DIAGNOSIS — I5022 Chronic systolic (congestive) heart failure: Secondary | ICD-10-CM

## 2014-11-07 DIAGNOSIS — I2510 Atherosclerotic heart disease of native coronary artery without angina pectoris: Secondary | ICD-10-CM

## 2014-11-07 MED ORDER — NITROGLYCERIN 0.4 MG SL SUBL: 0.4000 mg | SUBLINGUAL_TABLET | SUBLINGUAL | Status: DC | PRN

## 2014-11-07 NOTE — Progress Notes (Signed)
[name] Date of Birth: 12-27-1945 Medical Record #[id number]  History of Present Illness: [name] is seen today for followup of atrial fibrillation and CAD.  He has a history of remote anterior myocardial infarction with an ischemic cardiomyopathy and ejection fraction of 23%. He is status post CRT D. He had a generator replacement in September 2014. He also has a history of atrial fibrillation. He is intolerant of amiodarone. His QT interval is too long for Tikosyn. He previously had attempted to increase his carvedilol dose but he developed significant hypotension. He is on Lanoxin for rate control. His last defibrillator check demonstrated that he was paced two thirds of the time and had intrinsic conduction one third of the time. His last stress test showed poor HR control with any exertion. We had requested follow up with Dr. Caryl [name] to discuss this but this never happened. He reports he is doing very well.  His activity level is still fairly low. He denies any dyspnea, chest pain, edema, or palpitations. He does report feeling lightheaded with activity or bending over.   Current Outpatient Prescriptions on File Prior to Visit  Medication Sig Dispense Refill  . Ascorbic Acid (VITAMIN C) 250 MG tablet Take 250 mg by mouth daily.      . carvedilol (COREG) 25 MG tablet Take one-half tablet by  mouth twice a day with  meals 90 tablet 2  . digoxin (LANOXIN) 0.125 MG tablet Take 0.5 tablets (0.0625 mg total) by mouth daily. 45 tablet 3  . eplerenone (INSPRA) 25 MG tablet Take 1 tablet by mouth  daily 90 tablet 1  . fenofibrate 160 MG tablet Take 1 tablet by mouth  daily 90 tablet 2  . folic acid (FOLVITE) 086 MCG tablet Take 800 mcg by mouth daily.     . furosemide (LASIX) 40 MG tablet Take 0.5 tablets (20 mg total) by mouth daily. 45 tablet 1  . lisinopril (PRINIVIL,ZESTRIL) 10 MG tablet Take 0.5 tablets (5 mg total) by mouth daily. 45 tablet 1  . LORazepam (ATIVAN) 1 MG tablet Take 1 mg  by mouth as needed for anxiety.     . Multiple Vitamins-Minerals (MULTIVITAMIN WITH MINERALS) tablet Take 1 tablet by mouth daily.      . potassium chloride SA (K-DUR,KLOR-CON) 20 MEQ tablet Take 10 mEq by mouth daily.    . pravastatin (PRAVACHOL) 40 MG tablet Take 1 tablet by mouth  every night at bedtime 90 tablet 2  . sertraline (ZOLOFT) 100 MG tablet Take 100 mg by mouth daily.      Marland Kitchen warfarin (COUMADIN) 5 MG tablet Take 2.5-5 mg by mouth daily. Take one tablet (5mg ) on Monday, Wednesday, and Friday. Take half-tablet (2.5mg ) on Tuesday, Thursday, Saturday, and Sunday.     No current facility-administered medications on file prior to visit.    No Known Allergies  Past Medical History  Diagnosis Date  . Coronary artery disease   . MI (myocardial infarction) 2000    ANTERIOR, s/p PCI  . Ischemic cardiomyopathy     EF 23%  . Persistent atrial fibrillation     on coumadin  . Chronic renal insufficiency   . Dyslipidemia   . Depression   . ED (erectile dysfunction)   . Other primary cardiomyopathies     Past Surgical History  Procedure Laterality Date  . Cardiac catheterization  02/05/2008    MODERATE TO SEVERE LEFT VENTRICULAR  DYSFUNCTION WITH EF 25-30%  . Coronary stent placement  LAD  . Transthoracic echocardiogram  12/2010    EF 30-35%  . Cardiac defibrillator placement  2009    MDT BI-VENTRICULAR ICD by Dr [name]  . Wrist fracture surgery    . Breast lumpectomy    . Implantable cardioverter defibrillator generator change  2014    History  Smoking status  . Former Smoker  . Types: Cigarettes  . Quit date: 12/23/1998  Smokeless tobacco  . Not on file    History  Alcohol Use No    Family History  Problem Relation Age of Onset  . Breast cancer Mother   . Depression Mother   . Heart attack Father   . Breast cancer Sister     Review of Systems: As noted in history of present illness. All other systems were reviewed and are negative.  Physical Exam: BP  110/75 mmHg  Pulse 69  Ht 5\' 10"  (1.778 m)  Wt 214 lb 14.4 oz (97.478 kg)  BMI 30.83 kg/m2 He is an overweight white male in no acute distress. HEENT: Unremarkable No JVD or bruits. Lungs: Clear Cardiovascular: Regular rate and rhythm, normal S1 and S2. Grade 2/6 systolic murmur at apex. Abdomen: Obese, soft, nontender. No masses or bruits. Extremities: Trace edema below the knees. Large venous varicosities. Skin: Warm and dry Neuro: Alert oriented x3. Cranial nerves II through XII are intact.  LABORATORY DATA:  EXERCISE STRESS TEST REPORT [name], [name] DOB: [id number] Patient ID: [id number] Age: 40 yr Height: 63 in Gender: Male Weight: 211 lb Race: Caucasian Study Date: 01-Apr-2014 Referring Physician: [name] Test Type: ETT Attending Physician: -- Protocol: CVN-BRUCE Technician: [name] Medications: -- Medical History: AICD, ISCHEMIC CARDIOMYOPATHY, MI, CAD, PTCAS, AF, LIPIDS, HTN, HX. SMOKING Reason for Exercise Test: DOE Exercise Test Summary Phase Stage Time Speed Grade HR BP Comment Name Name in Stage (mph) (%) (bpm) (mmHg) PRETEST STANDING 04:36 0.0 0.0 111 119/89 WARM-UP 00:27 1.0 0.0 109 Exercise STAGE 1 03:00 1.7 10.0 153 114/66 STAGE 2 01:02 2.5 12.0 164 Recovery 06:13 0.0 0.0 97 117/66 The patient exercised according to the CVN-BRUCE for 04:01 min:s, achieving a work level of Max. METS: 5.8. The resting heart rate of 95 bpm rose to a maximal heart rate of 176 bpm. This value represents 114 % of the maximal, age-predicted heart rate. The resting blood pressure of 119/89 mmHg , rose to a maximum blood pressure of 133/69 mmHg. The exercise test was stopped due to FATIGUE, SOB. Interpretation Summary: Functional Capacity: Decreased (> 40%). HR Response to Exercise: Exaggerated Overall HR Response To Exercise. BP Response to Exercise: Normal Resting BP with Appropriate Response. Chest Pain: No Chest Pain. Arrhythmias: Arrhythmias  Remain During Exercise. ST Changes: NONDIAGNOSTIC D/T BASELINE EKG. Conclusions Baseline ECG show atrial fibrillation, nonspecific IVCD and triggered BiV pacing. Overall BiV pacing percentage is low. Heart rate response during exercise is exaggerated with complete loss of BiV pacing after only 2 minutes in the first stage of the Bruce protocol ECG stress test is uninterpretable for ischemia due to baseline major IVCD. Confirmed by [name] Kuster, MD, [facility] [phone]) on 04/01/2014 6:40:48 PM Also confirmed by [name] Kuster, MD, [place] [phone]) on 04/01/2014 6:42:03 PM   Assessment / Plan: 1. Atrial fibrillation. Rate control at rest is adequate but poor with activity. HR with Afib increased to 153 bpm within one minute of exercise. He will not tolerate additional coreg as noted above. Increasing digoxin is not likely to impact his exercise HR. Really no medical options. Will schedule appointment  with Dr. Caryl [name] to discuss. ? If AV node ablation beneficial.   2. Coronary disease with remote anterior myocardial infarction. He is asymptomatic.  3. Chronic systolic congestive heart failure. He is on optimal therapy with carvedilol, ACE inhibitor, diuretics, and Inspra. Further titration was limited by hypotension. Ejection fraction is 25-30%. Continue sodium restriction.

## 2014-11-07 NOTE — Patient Instructions (Signed)
Continue your current therapy   We will get you an appointment with Dr. Caryl Comes  I will see you in 6 months.

## 2014-11-12 ENCOUNTER — Telehealth: Payer: Self-pay

## 2014-11-12 NOTE — Telephone Encounter (Signed)
Spoke to patient appointment scheduled with Dr.Klein 11/28/14 at 8:15 am.

## 2014-11-20 ENCOUNTER — Other Ambulatory Visit: Payer: Self-pay | Admitting: Cardiology

## 2014-11-27 ENCOUNTER — Encounter (HOSPITAL_COMMUNITY): Payer: Self-pay | Admitting: Internal Medicine

## 2014-11-28 ENCOUNTER — Encounter: Payer: Self-pay | Admitting: Internal Medicine

## 2014-11-28 ENCOUNTER — Ambulatory Visit (INDEPENDENT_AMBULATORY_CARE_PROVIDER_SITE_OTHER): Payer: 59 | Admitting: Internal Medicine

## 2014-11-28 VITALS — BP 114/70 | HR 71 | Ht 70.0 in | Wt 217.6 lb

## 2014-11-28 DIAGNOSIS — I482 Chronic atrial fibrillation, unspecified: Secondary | ICD-10-CM

## 2014-11-28 DIAGNOSIS — I255 Ischemic cardiomyopathy: Secondary | ICD-10-CM

## 2014-11-28 DIAGNOSIS — I5022 Chronic systolic (congestive) heart failure: Secondary | ICD-10-CM

## 2014-11-28 DIAGNOSIS — Z4502 Encounter for adjustment and management of automatic implantable cardiac defibrillator: Secondary | ICD-10-CM

## 2014-11-28 DIAGNOSIS — Z95810 Presence of automatic (implantable) cardiac defibrillator: Secondary | ICD-10-CM

## 2014-11-28 LAB — MDC_IDC_ENUM_SESS_TYPE_INCLINIC
Battery Remaining Longevity: 86 mo
Battery Voltage: 2.97 V
Brady Statistic AP VP Percent: 0 %
Brady Statistic AS VS Percent: 36.35 %
Brady Statistic RV Percent Paced: 67.13 %
HIGH POWER IMPEDANCE MEASURED VALUE: 171 Ohm
HighPow Impedance: 58 Ohm
HighPow Impedance: 80 Ohm
Lead Channel Sensing Intrinsic Amplitude: 10 mV
Lead Channel Sensing Intrinsic Amplitude: 14.125 mV
Lead Channel Sensing Intrinsic Amplitude: 2.375 mV
Lead Channel Setting Pacing Amplitude: 1.75 V
Lead Channel Setting Pacing Amplitude: 2.5 V
Lead Channel Setting Pacing Pulse Width: 0.6 ms
Lead Channel Setting Sensing Sensitivity: 0.3 mV
MDC IDC MSMT LEADCHNL LV IMPEDANCE VALUE: 342 Ohm
MDC IDC MSMT LEADCHNL LV IMPEDANCE VALUE: 570 Ohm
MDC IDC MSMT LEADCHNL LV IMPEDANCE VALUE: 722 Ohm
MDC IDC MSMT LEADCHNL LV PACING THRESHOLD AMPLITUDE: 0.75 V
MDC IDC MSMT LEADCHNL LV PACING THRESHOLD PULSEWIDTH: 0.6 ms
MDC IDC MSMT LEADCHNL RA IMPEDANCE VALUE: 551 Ohm
MDC IDC MSMT LEADCHNL RA SENSING INTR AMPL: 1.875 mV
MDC IDC MSMT LEADCHNL RV IMPEDANCE VALUE: 380 Ohm
MDC IDC SESS DTM: 20151211090931
MDC IDC SET LEADCHNL RV PACING PULSEWIDTH: 0.6 ms
MDC IDC SET ZONE DETECTION INTERVAL: 300 ms
MDC IDC STAT BRADY AP VS PERCENT: 0 %
MDC IDC STAT BRADY AS VP PERCENT: 63.65 %
MDC IDC STAT BRADY RA PERCENT PACED: 0 %
Zone Setting Detection Interval: 340 ms
Zone Setting Detection Interval: 350 ms
Zone Setting Detection Interval: 400 ms

## 2014-11-28 MED ORDER — EPLERENONE 25 MG PO TABS: ORAL_TABLET | ORAL | Status: DC

## 2014-11-28 MED ORDER — METOPROLOL SUCCINATE ER 25 MG PO TB24: 25.0000 mg | ORAL_TABLET | Freq: Every day | ORAL | Status: DC

## 2014-11-28 NOTE — Patient Instructions (Addendum)
Your physician has recommended you make the following change in your medication:  1) DECREASE Inspra (Eplerenone) to 1/2 tablet daily 2) START Metorpolol Succinate 25 mg daily  Lab today: Digoxin level  Your physician wants you to follow-up in: 3 months with Dr. Caryl Comes. You will receive a reminder letter in the mail two months in advance. If you don't receive a letter, please call our office to schedule the follow-up appointment.  Please call in 6 weeks and let us know progress/how things are going.

## 2014-11-28 NOTE — Progress Notes (Signed)
Patient Care Team: [name], MD as PCP - General (Family Medicine)   HPI  [name] is a 68 y.o. male Seen in followup of afib for which he recently saw Dr Greggory Brandy and there was a decision to not pursue PVI He also has ischemic cardiomyopathy and HFrEF with prior CRT-D from which he garnered little benefit. CXR demonstrates good lead location  He struggles with depression   It is not clear that he had his symptoms are related to his atrial fibrillation. Clearly he has no palpitations. Is also not certain that he can recall previously when his atrial fibrillation is paroxysmal that his symptoms were worse while in AF.  Past Medical History  Diagnosis Date  . Coronary artery disease   . MI (myocardial infarction) 2000    ANTERIOR, s/p PCI  . Ischemic cardiomyopathy     EF 23%  . Persistent atrial fibrillation     on coumadin  . Chronic renal insufficiency   . Dyslipidemia   . Depression   . ED (erectile dysfunction)   . Other primary cardiomyopathies     Past Surgical History  Procedure Laterality Date  . Cardiac catheterization  02/05/2008    MODERATE TO SEVERE LEFT VENTRICULAR  DYSFUNCTION WITH EF 25-30%  . Coronary stent placement      LAD  . Transthoracic echocardiogram  12/2010    EF 30-35%  . Cardiac defibrillator placement  2009    MDT BI-VENTRICULAR ICD by Dr Caryl Comes  . Wrist fracture surgery    . Breast lumpectomy    . Implantable cardioverter defibrillator generator change  2014  . Biv icd genertaor change out N/A 09/16/2013    Procedure: BIV ICD GENERTAOR CHANGE OUT;  Surgeon: [name], MD;  Location: [facility] CATH LAB;  Service: Cardiovascular;  Laterality: N/A;    Current Outpatient Prescriptions  Medication Sig Dispense Refill  . Ascorbic Acid (VITAMIN C) 250 MG tablet Take 250 mg by mouth daily.      . carvedilol (COREG) 25 MG tablet Take one-half tablet by  mouth twice a day with  meals 90 tablet 2  . digoxin (LANOXIN) 0.125 MG tablet Take  0.5 tablets (0.0625 mg total) by mouth daily. 45 tablet 3  . eplerenone (INSPRA) 25 MG tablet Take 1 tablet by mouth  daily 90 tablet 1  . fenofibrate 160 MG tablet Take 1 tablet by mouth  daily 90 tablet 2  . folic acid (FOLVITE) 599 MCG tablet Take 800 mcg by mouth daily.     . furosemide (LASIX) 40 MG tablet Take 0.5 tablets (20 mg total) by mouth daily. 45 tablet 1  . lisinopril (PRINIVIL,ZESTRIL) 10 MG tablet Take 0.5 tablets (5 mg total) by mouth daily. 45 tablet 1  . LORazepam (ATIVAN) 1 MG tablet Take 1 mg by mouth as needed for anxiety.     . Multiple Vitamins-Minerals (MULTIVITAMIN WITH MINERALS) tablet Take 1 tablet by mouth daily.      . nitroGLYCERIN (NITROSTAT) 0.4 MG SL tablet Place 1 tablet (0.4 mg total) under the tongue every 5 (five) minutes as needed. 25 tablet 11  . potassium chloride SA (K-DUR,KLOR-CON) 20 MEQ tablet Take one-half tablet by  mouth daily 45 tablet 1  . pravastatin (PRAVACHOL) 40 MG tablet Take 1 tablet by mouth  every night at bedtime 90 tablet 2  . sertraline (ZOLOFT) 100 MG tablet Take 100 mg by mouth daily.      Marland Kitchen warfarin (COUMADIN)  5 MG tablet Take 2.5-5 mg by mouth daily. Take one tablet (2.5mg ) on Monday, Wednesday, and Friday. Take half-tablet (5mg ) on Tuesday, Thursday, Saturday, and Sunday.     No current facility-administered medications for this visit.    No Known Allergies  Review of Systems negative except from HPI and PMH  Physical Exam BP 114/70 mmHg  Pulse 71  Ht 5\' 10"  (1.778 m)  Wt 217 lb 9.6 oz (98.703 kg)  BMI 31.22 kg/m2 Well developed and nourished in no acute distress HENT normal Neck supple with JVP-7 Carotids brisk and full without bruits Clear Irregularly irregular rate and rhythm with controlled ventricular response, no murmurs or gallops Abd-soft with active BS without hepatomegaly No Clubbing cyanosis edema Skin-warm and dry A & Oriented  Grossly normal sensory and motor function    Assessment and   Plan  Atrial fibrillation  Congestive heart failure-chronic-systolic  Ischemic cardiomyopathy  Hypotension  CRT-D.  Depression  His treadmill test which is lost to my follow-up from May had demonstrated very rapid increase in heart rate response to exercise rapidly exceeding 150 bpm. However, it is been clear over the years that we have not been able to clearly describe symptoms to atrial fibrillation compared to sinus. No matter what has been done, his dyspnea and his fatigue has been relatively constant not withstanding the very rapid atrial fibrillation ventricular rate response.  Hence, all we have discussed AV ablation and referral to [facility] for consideration of pulmonary vein isolation, in the absence of clearly ascribable symptoms I think neither of these procedures is indicated currently.  Hence, we will undertake augmented rate control by adding a little bit of an alternative beta blocker to his carvedilol using metoprolol succinate 25 mg. He will let us know next 4-6 weeks if there is any change in either his symptoms or his fatigue. So as to mitigate his tendency towards low perclosure we will decrease his eplerenone from 25--12.5 mg a day  We spent more than 50% of our >40 min visit in face to face counseling regarding the above

## 2014-11-29 LAB — DIGOXIN LEVEL: Digoxin Level: 0.8 ng/mL (ref 0.8–2.0)

## 2015-01-07 ENCOUNTER — Other Ambulatory Visit: Payer: Self-pay | Admitting: Cardiology

## 2015-01-07 NOTE — Telephone Encounter (Signed)
Pt need a prescription for his Carvedilol 25 mg #30. Please call to [phone].He needs this until his mail order comes in. Please call him,when you called this in.

## 2015-01-14 ENCOUNTER — Other Ambulatory Visit: Payer: Self-pay | Admitting: *Deleted

## 2015-01-14 MED ORDER — CARVEDILOL 25 MG PO TABS: ORAL_TABLET | ORAL | Status: DC

## 2015-01-19 ENCOUNTER — Encounter: Payer: Self-pay | Admitting: Internal Medicine

## 2015-01-19 ENCOUNTER — Ambulatory Visit (INDEPENDENT_AMBULATORY_CARE_PROVIDER_SITE_OTHER): Payer: 59 | Admitting: Internal Medicine

## 2015-01-19 VITALS — BP 118/66 | HR 90 | Ht 70.0 in | Wt 220.6 lb

## 2015-01-19 DIAGNOSIS — I4819 Other persistent atrial fibrillation: Secondary | ICD-10-CM

## 2015-01-19 DIAGNOSIS — I481 Persistent atrial fibrillation: Secondary | ICD-10-CM

## 2015-01-19 DIAGNOSIS — I255 Ischemic cardiomyopathy: Secondary | ICD-10-CM

## 2015-01-19 DIAGNOSIS — Z4502 Encounter for adjustment and management of automatic implantable cardiac defibrillator: Secondary | ICD-10-CM

## 2015-01-19 DIAGNOSIS — I5022 Chronic systolic (congestive) heart failure: Secondary | ICD-10-CM

## 2015-01-19 LAB — MDC_IDC_ENUM_SESS_TYPE_INCLINIC
Battery Remaining Longevity: 86 mo
Brady Statistic AP VS Percent: 0 %
Brady Statistic AS VP Percent: 63.18 %
Brady Statistic AS VS Percent: 36.82 %
Brady Statistic RA Percent Paced: 0 %
Date Time Interrogation Session: 20160201100643
HIGH POWER IMPEDANCE MEASURED VALUE: 171 Ohm
HighPow Impedance: 52 Ohm
HighPow Impedance: 67 Ohm
Lead Channel Impedance Value: 323 Ohm
Lead Channel Impedance Value: 551 Ohm
Lead Channel Impedance Value: 760 Ohm
Lead Channel Pacing Threshold Amplitude: 1 V
Lead Channel Pacing Threshold Pulse Width: 0.6 ms
Lead Channel Sensing Intrinsic Amplitude: 1.75 mV
Lead Channel Sensing Intrinsic Amplitude: 2.875 mV
Lead Channel Setting Pacing Amplitude: 1.75 V
Lead Channel Setting Pacing Amplitude: 2.5 V
Lead Channel Setting Pacing Pulse Width: 0.6 ms
Lead Channel Setting Sensing Sensitivity: 0.3 mV
MDC IDC MSMT BATTERY VOLTAGE: 2.98 V
MDC IDC MSMT LEADCHNL LV PACING THRESHOLD AMPLITUDE: 0.75 V
MDC IDC MSMT LEADCHNL LV PACING THRESHOLD PULSEWIDTH: 0.6 ms
MDC IDC MSMT LEADCHNL RA IMPEDANCE VALUE: 494 Ohm
MDC IDC MSMT LEADCHNL RV IMPEDANCE VALUE: 380 Ohm
MDC IDC MSMT LEADCHNL RV SENSING INTR AMPL: 10.5 mV
MDC IDC MSMT LEADCHNL RV SENSING INTR AMPL: 11.25 mV
MDC IDC SET LEADCHNL LV PACING PULSEWIDTH: 0.6 ms
MDC IDC SET ZONE DETECTION INTERVAL: 300 ms
MDC IDC SET ZONE DETECTION INTERVAL: 350 ms
MDC IDC SET ZONE DETECTION INTERVAL: 400 ms
MDC IDC STAT BRADY AP VP PERCENT: 0 %
MDC IDC STAT BRADY RV PERCENT PACED: 67.12 %
Zone Setting Detection Interval: 340 ms

## 2015-01-19 NOTE — Patient Instructions (Addendum)
Your physician has recommended you make the following change in your medication:  1) CHANGE Furosemide (Lasix) - take 20 mg twice daily, next day 20 mg once, next day 20 mg twice, next day 20 mg once - alternate this pattern for 10 days. Return to normal dosing at the end of the 10 days. 2) After the 10 days with Lasix - hold Metoprolol for 2 weeks.  Call Yazlyn Wentzel, RN at the end of this and let her know how things are going. [phone]  Your physician recommends that you schedule a follow-up appointment in: 4 months with Dr Caryl Comes.

## 2015-01-19 NOTE — Progress Notes (Signed)
Patient Care Team: [name], MD as PCP - General (Family Medicine)   HPI  [name] is a 69 y.o. male Seen in followup of afib for which he recently saw Dr Greggory Brandy and the This is true not withstandingre was a decision to not pursue P  VI He also has ichemic cardiomyopathy and HFrEF with prior CRT-D from which he garnered little benefit. CXR demonstrates good lead location  He struggles with depression   It is not clear that  he had his symptoms of dyspnea are related to his atrial fibrillation. Clearly he has no palpitations. Is also not certain that he can recall previously when his atrial fibrillation is paroxysmal that his symptoms were worse while in AF. At the last visit after long discussions, we decided to pursue augmented rate control with the introduction of low-dose beta blockers.  The absence of significant symptoms we also decided that pursuing ablation either of the AV node or of the pulmonary veins was not appropriate.  Last metabolic profile was 5/64. Dig level 0.8 on 12/15  Past Medical History  Diagnosis Date  . Coronary artery disease   . MI (myocardial infarction) 2000    ANTERIOR, s/p PCI  . Ischemic cardiomyopathy     EF 23%  . Persistent atrial fibrillation     on coumadin  . Chronic renal insufficiency   . Dyslipidemia   . Depression   . ED (erectile dysfunction)   . Other primary cardiomyopathies     Past Surgical History  Procedure Laterality Date  . Cardiac catheterization  02/05/2008    MODERATE TO SEVERE LEFT VENTRICULAR  DYSFUNCTION WITH EF 25-30%  . Coronary stent placement      LAD  . Transthoracic echocardiogram  12/2010    EF 30-35%  . Cardiac defibrillator placement  2009    MDT BI-VENTRICULAR ICD by Dr Caryl Comes  . Wrist fracture surgery    . Breast lumpectomy    . Implantable cardioverter defibrillator generator change  2014  . Biv icd genertaor change out N/A 09/16/2013    Procedure: BIV ICD GENERTAOR CHANGE OUT;   Surgeon: [name], MD;  Location: [facility] CATH LAB;  Service: Cardiovascular;  Laterality: N/A;    Current Outpatient Prescriptions  Medication Sig Dispense Refill  . Ascorbic Acid (VITAMIN C) 250 MG tablet Take 250 mg by mouth daily.      . carvedilol (COREG) 25 MG tablet Take one-half tablet by  mouth twice a day with  meals 30 tablet 0  . digoxin (LANOXIN) 0.125 MG tablet Take 0.5 tablets (0.0625 mg total) by mouth daily. 45 tablet 3  . eplerenone (INSPRA) 25 MG tablet Take 1 tablet by mouth  daily 90 tablet 2  . fenofibrate 160 MG tablet Take 1 tablet by mouth  daily 90 tablet 2  . folic acid (FOLVITE) 332 MCG tablet Take 800 mcg by mouth daily.     . furosemide (LASIX) 40 MG tablet Take one-half tablet by  mouth daily 45 tablet 2  . lisinopril (PRINIVIL,ZESTRIL) 10 MG tablet Take 0.5 tablets (5 mg total) by mouth daily. 45 tablet 1  . LORazepam (ATIVAN) 1 MG tablet Take 1 mg by mouth as needed for anxiety.     . metoprolol succinate (TOPROL-XL) 25 MG 24 hr tablet Take 1 tablet (25 mg total) by mouth daily. Take with or immediately following a meal. 30 tablet 2  . Multiple Vitamins-Minerals (MULTIVITAMIN WITH MINERALS) tablet Take 1  tablet by mouth daily.      . nitroGLYCERIN (NITROSTAT) 0.4 MG SL tablet Place 1 tablet (0.4 mg total) under the tongue every 5 (five) minutes as needed. 25 tablet 11  . potassium chloride SA (K-DUR,KLOR-CON) 20 MEQ tablet Take one-half tablet by  mouth daily 45 tablet 1  . pravastatin (PRAVACHOL) 40 MG tablet Take 1 tablet by mouth  every night at bedtime 90 tablet 2  . sertraline (ZOLOFT) 100 MG tablet Take 100 mg by mouth daily.      Marland Kitchen warfarin (COUMADIN) 5 MG tablet Take 2.5-5 mg by mouth daily. Take one tablet (2.5mg ) on Monday, Wednesday, and Friday. Take half-tablet (5mg ) on Tuesday, Thursday, Saturday, and Sunday.     No current facility-administered medications for this visit.    No Known Allergies  Review of Systems negative except from HPI and  PMH  Physical Exam There were no vitals taken for this visit. Well developed and nourished in no acute distress HENT normal Neck supple with JVP-7 Carotids brisk and full without bruits Clear Irregularly irregular rate and rhythm with controlled ventricular response, no murmurs or gallops Abd-soft with active BS without hepatomegaly No Clubbing cyanosis edema Skin-warm and dry A & Oriented  Grossly normal sensory and motor function    Assessment and  Plan  Atrial fibrillation  Congestive heart failure-chronic-systolic  Ischemic cardiomyopathy  Hypotension  CRT-D.  The patient's device was interrogated.  The information was reviewed. No changes were made in the programming.    Is permanent.  Depression  Irritability   He notes that his dyspnea is better on metoprolol.  This is true notwithstanding the fact that is heart rate excursion is no different  He also thinks he is more irritable and so we will plan an elimination trial and if this improves try to use nebivolol which is less lipophilic  He has accumulated modest volume  We will increase his furosemide to bid alternating Qd form 10 days and have discussed the importance of volume restrictions  We also discussed the importance of monitoring of his electrolytes especially in the context of his eplerenone. This needs to be done every 3 months. He would like to have this done with his PCP.  Blood pressure is well-controlled.  Without symptoms of ischemia  We spent more than 50% of our > 40  min visit in face to face counseling regarding the above

## 2015-02-19 ENCOUNTER — Telehealth: Payer: Self-pay | Admitting: Internal Medicine

## 2015-02-19 NOTE — Telephone Encounter (Signed)
New message      Calling to give [name] an update on his medication dosage change.

## 2015-02-19 NOTE — Telephone Encounter (Signed)
Calling stating he was suppose to call Sherri to let her know his status after Dr. Caryl Comes increase his dosage of Lasix.  States he is still taking Lasix 20 mg daily alternating with 40 mg.  According to Dr. Olin Pia note he was to do this for 10 days but states he has continued to take as he instructed on 01/19/15.  States he can't tell any difference in the swelling but he always has swelling in feet.  States he did stop the Metoprolol but is back on that now. No SOB.  Also states his case manager for CHF wants him to check with Dr. Caryl Comes since he is taking both Carvedilol 25 mg 1/2 tab BID; and Metoprolol XL 25 mg 1/2 tablet daily.  Advised will forward to Dr. Caryl Comes and Alver Sorrow for recommendations.  Advised him to go back to taking the 20 mg daily as Dr. Caryl Comes suggested in his note.

## 2015-02-24 ENCOUNTER — Encounter: Payer: Self-pay | Admitting: Cardiology

## 2015-02-25 NOTE — Telephone Encounter (Signed)
Dr. Caryl Comes seen, knows patient is on both medications (his words - this is intentional)

## 2015-03-20 ENCOUNTER — Other Ambulatory Visit: Payer: Self-pay | Admitting: *Deleted

## 2015-03-20 MED ORDER — LISINOPRIL 10 MG PO TABS: 5.0000 mg | ORAL_TABLET | Freq: Every day | ORAL | Status: DC

## 2015-05-20 ENCOUNTER — Other Ambulatory Visit: Payer: Self-pay | Admitting: Cardiology

## 2015-05-25 ENCOUNTER — Encounter: Payer: Self-pay | Admitting: Cardiology

## 2015-05-25 ENCOUNTER — Ambulatory Visit (INDEPENDENT_AMBULATORY_CARE_PROVIDER_SITE_OTHER): Payer: 59 | Admitting: Cardiology

## 2015-05-25 VITALS — BP 98/52 | HR 84 | Ht 70.0 in | Wt 217.3 lb

## 2015-05-25 DIAGNOSIS — I2510 Atherosclerotic heart disease of native coronary artery without angina pectoris: Secondary | ICD-10-CM

## 2015-05-25 DIAGNOSIS — Z95810 Presence of automatic (implantable) cardiac defibrillator: Secondary | ICD-10-CM

## 2015-05-25 DIAGNOSIS — I482 Chronic atrial fibrillation, unspecified: Secondary | ICD-10-CM

## 2015-05-25 DIAGNOSIS — I5022 Chronic systolic (congestive) heart failure: Secondary | ICD-10-CM | POA: Diagnosis not present

## 2015-05-25 MED ORDER — CARVEDILOL 25 MG PO TABS: ORAL_TABLET | ORAL | Status: DC

## 2015-05-25 MED ORDER — LOSARTAN POTASSIUM 25 MG PO TABS: 25.0000 mg | ORAL_TABLET | Freq: Every day | ORAL | Status: DC

## 2015-05-25 NOTE — Patient Instructions (Signed)
Stop taking lisinopril  Start taking losartan 25 mg daily. I think you will notice an improvement in the cough.  I will see you in 6 months.

## 2015-05-25 NOTE — Progress Notes (Signed)
[name] Date of Birth: 20-Nov-1946 Medical Record #[id number]  History of Present Illness: Mr. Lamagna is seen today for followup of atrial fibrillation and CAD.  He has a history of remote anterior myocardial infarction with an ischemic cardiomyopathy and ejection fraction of 23%. He is status post CRT D. He had a generator replacement in September 2014. He also has a history of atrial fibrillation. He is intolerant of amiodarone. His QT interval is too long for Tikosyn. He previously had attempted to increase his carvedilol dose but he developed significant hypotension. He is on Lanoxin and low dose metoprolol for rate control. Seen by Dr. Caryl Comes who felt that without significant symptoms Ablation was not recommended.   He reports he is doing very well.  His activity level is still fairly low. He denies any dyspnea, chest pain, edema, or palpitations. He does complain of a dry chronic cough.   Current Outpatient Prescriptions on File Prior to Visit  Medication Sig Dispense Refill  . Ascorbic Acid (VITAMIN C) 250 MG tablet Take 250 mg by mouth daily.      . digoxin (LANOXIN) 0.125 MG tablet Take 0.5 tablets (0.0625 mg total) by mouth daily. 45 tablet 3  . eplerenone (INSPRA) 25 MG tablet Take 1 tablet by mouth  daily 90 tablet 2  . fenofibrate 160 MG tablet Take 1 tablet by mouth  daily 90 tablet 2  . folic acid (FOLVITE) 696 MCG tablet Take 800 mcg by mouth daily.     . furosemide (LASIX) 40 MG tablet Take one-half tablet by  mouth daily 45 tablet 2  . LORazepam (ATIVAN) 1 MG tablet Take 1 mg by mouth as needed for anxiety.     . Multiple Vitamins-Minerals (MULTIVITAMIN WITH MINERALS) tablet Take 1 tablet by mouth daily.      . nitroGLYCERIN (NITROSTAT) 0.4 MG SL tablet Place 1 tablet (0.4 mg total) under the tongue every 5 (five) minutes as needed. 25 tablet 11  . potassium chloride SA (K-DUR,KLOR-CON) 20 MEQ tablet Take one-half tablet by  mouth daily 45 tablet 0  . pravastatin  (PRAVACHOL) 40 MG tablet Take 1 tablet by mouth  every night at bedtime 90 tablet 2  . sertraline (ZOLOFT) 100 MG tablet Take 100 mg by mouth daily.      Marland Kitchen warfarin (COUMADIN) 5 MG tablet Take 2.5-5 mg by mouth daily. Take one tablet (2.5mg ) on Monday, Wednesday, and Friday. Take half-tablet (5mg ) on Tuesday, Thursday, Saturday, and Sunday.     No current facility-administered medications on file prior to visit.    No Known Allergies  Past Medical History  Diagnosis Date  . Coronary artery disease   . MI (myocardial infarction) 2000    ANTERIOR, s/p PCI  . Ischemic cardiomyopathy     EF 23%  . Persistent atrial fibrillation     on coumadin  . Chronic renal insufficiency   . Dyslipidemia   . Depression   . ED (erectile dysfunction)   . Other primary cardiomyopathies     Past Surgical History  Procedure Laterality Date  . Cardiac catheterization  02/05/2008    MODERATE TO SEVERE LEFT VENTRICULAR  DYSFUNCTION WITH EF 25-30%  . Coronary stent placement      LAD  . Transthoracic echocardiogram  12/2010    EF 30-35%  . Cardiac defibrillator placement  2009    MDT BI-VENTRICULAR ICD by Dr Caryl Comes  . Wrist fracture surgery    . Breast lumpectomy    . Implantable  cardioverter defibrillator generator change  2014  . Biv icd genertaor change out N/A 09/16/2013    Procedure: BIV ICD GENERTAOR CHANGE OUT;  Surgeon: [name], MD;  Location: [facility] CATH LAB;  Service: Cardiovascular;  Laterality: N/A;    History  Smoking status  . Former Smoker  . Types: Cigarettes  . Quit date: 12/23/1998  Smokeless tobacco  . Not on file    History  Alcohol Use No    Family History  Problem Relation Age of Onset  . Breast cancer Mother   . Depression Mother   . Heart attack Father   . Breast cancer Sister     Review of Systems: As noted in history of present illness. All other systems were reviewed and are negative.  Physical Exam: BP 98/52 mmHg  Pulse 84  Ht 5\' 10"  (1.778 m)  Wt  98.567 kg (217 lb 4.8 oz)  BMI 31.18 kg/m2 He is an overweight white male in no acute distress. HEENT: Unremarkable No JVD or bruits. Lungs: Clear Cardiovascular: Regular rate and rhythm, normal S1 and S2. Grade 2/6 systolic murmur at apex. Abdomen: Obese, soft, nontender. No masses or bruits. Extremities: Trace edema below the knees. Large venous varicosities. Skin: Warm and dry Neuro: Alert oriented x3. Cranial nerves II through XII are intact.  LABORATORY DATA:   Assessment / Plan: 1. Atrial fibrillation. Rate control at rest is adequate. With prior stress test HR with Afib increased to 153 bpm within one minute of exercise. Followed by Dr. Caryl Comes who recommends continued rate control strategy with addition of low dose metoprolol.   2. Coronary disease with remote anterior myocardial infarction. He is asymptomatic.  3. Chronic systolic congestive heart failure. He is on optimal therapy with carvedilol, ACE inhibitor, diuretics, and Inspra. Further titration was limited by hypotension. Ejection fraction is 25-30%. Continue sodium restriction.   4. Cough. Probably related to ACEi. Will switch lisinopril to losartan 25 mg daily.   I will follow up in 6 months.

## 2015-07-09 ENCOUNTER — Other Ambulatory Visit: Payer: Self-pay | Admitting: Internal Medicine

## 2015-07-14 ENCOUNTER — Other Ambulatory Visit: Payer: Self-pay | Admitting: Cardiology

## 2015-07-14 ENCOUNTER — Other Ambulatory Visit: Payer: Self-pay | Admitting: Internal Medicine

## 2015-07-14 NOTE — Telephone Encounter (Signed)
Rx(s) sent to pharmacy electronically.  

## 2015-07-16 ENCOUNTER — Other Ambulatory Visit: Payer: Self-pay | Admitting: Cardiology

## 2015-07-16 NOTE — Telephone Encounter (Signed)
REFILL 

## 2015-08-18 ENCOUNTER — Other Ambulatory Visit: Payer: Self-pay | Admitting: Cardiology

## 2015-10-12 ENCOUNTER — Other Ambulatory Visit: Payer: Self-pay | Admitting: Cardiology

## 2015-10-28 ENCOUNTER — Telehealth: Payer: Self-pay | Admitting: Cardiology

## 2015-10-28 MED ORDER — NITROGLYCERIN 0.4 MG SL SUBL
0.4000 mg | SUBLINGUAL_TABLET | SUBLINGUAL | Status: DC | PRN
Start: 2015-10-28 — End: 2016-03-02

## 2015-10-28 NOTE — Telephone Encounter (Signed)
Per answering service:   *STAT* If patient is at the pharmacy, call can be transferred to refill team.   1. Which medications need to be refilled? (please list name of each medication and dose if known) Nitrostat   2. Which pharmacy/location (including street and city if local pharmacy) is medication to be sent to? [facility] on [address]   3. Do they need a 30 day or 90 day supply? [place]

## 2015-10-28 NOTE — Telephone Encounter (Signed)
Pt's medication was sent to his pharmacy. Confirmation received

## 2015-11-16 ENCOUNTER — Other Ambulatory Visit: Payer: Self-pay | Admitting: Cardiology

## 2015-11-16 NOTE — Telephone Encounter (Signed)
Rx(s) sent to pharmacy electronically.  

## 2015-12-13 ENCOUNTER — Other Ambulatory Visit: Payer: Self-pay | Admitting: Cardiology

## 2015-12-25 ENCOUNTER — Ambulatory Visit (INDEPENDENT_AMBULATORY_CARE_PROVIDER_SITE_OTHER): Payer: 59 | Admitting: Cardiology

## 2015-12-25 ENCOUNTER — Encounter: Payer: Self-pay | Admitting: Cardiology

## 2015-12-25 VITALS — BP 120/68 | HR 89 | Ht 70.0 in | Wt 233.0 lb

## 2015-12-25 DIAGNOSIS — I482 Chronic atrial fibrillation, unspecified: Secondary | ICD-10-CM

## 2015-12-25 DIAGNOSIS — Z95810 Presence of automatic (implantable) cardiac defibrillator: Secondary | ICD-10-CM | POA: Diagnosis not present

## 2015-12-25 DIAGNOSIS — I5022 Chronic systolic (congestive) heart failure: Secondary | ICD-10-CM

## 2015-12-25 DIAGNOSIS — I10 Essential (primary) hypertension: Secondary | ICD-10-CM

## 2015-12-25 NOTE — Patient Instructions (Signed)
Continue your current therapy  Work on losing weight.  We will schedule you for an Echocardiogram  We will check on your device follow up.

## 2015-12-25 NOTE — Progress Notes (Signed)
[name] Date of Birth: 10/10/1946 Medical Record [id number]  History of Present Illness: [name] is seen today for followup of atrial fibrillation and CAD.  He has a history of remote anterior myocardial infarction with an ischemic cardiomyopathy and ejection fraction of 23%. He is status post CRT D. He had a generator replacement in September 2014. He also has a history of atrial fibrillation. He is intolerant of amiodarone. His QT interval is too long for Tikosyn. He previously had attempted to increase his carvedilol dose but he developed significant hypotension.   He reports he is doing very well.  His activity level is still fairly low. He denies any dyspnea, chest pain, edema, or palpitations. He has gained 10-11 lbs over the holidays.  Recently he has had symptoms of nocturia, urinary frequency and some incontinence. He is being evaluated by urology for this.   Current Outpatient Prescriptions on File Prior to Visit  Medication Sig Dispense Refill  . Ascorbic Acid (VITAMIN C) 250 MG tablet Take 250 mg by mouth daily.      . carvedilol (COREG) 25 MG tablet Take one-half tablet by  mouth twice a day with  meals 90 tablet 3  . DIGOX 125 MCG tablet Take one-half tablet by  mouth daily 45 tablet 3  . eplerenone (INSPRA) 25 MG tablet Take 1 tablet by mouth  daily 90 tablet 1  . fenofibrate 160 MG tablet Take 1 tablet by mouth  daily 90 tablet 2  . folic acid (FOLVITE) Q000111Q MCG tablet Take 800 mcg by mouth daily.     . furosemide (LASIX) 40 MG tablet Take one-half tablet by  mouth daily 45 tablet 2  . LORazepam (ATIVAN) 1 MG tablet Take 1 mg by mouth as needed for anxiety.     Marland Kitchen losartan (COZAAR) 25 MG tablet Take 1 tablet (25 mg total) by mouth daily. 90 tablet 3  . metoprolol succinate (TOPROL-XL) 25 MG 24 hr tablet TAKE 1 TABLET BY MOUTH DAILY. TAKE WITH OR IMMEDIATELY FOLLOWING A MEAL 30 tablet 5  . nitroGLYCERIN (NITROSTAT) 0.4 MG SL tablet Place 1 tablet (0.4 mg total) under  the tongue every 5 (five) minutes as needed. 25 tablet 7  . potassium chloride SA (K-DUR,KLOR-CON) 20 MEQ tablet Take one-half tablet by  mouth daily 45 tablet 6  . pravastatin (PRAVACHOL) 40 MG tablet Take 1 tablet by mouth  daily 90 tablet 1  . sertraline (ZOLOFT) 100 MG tablet Take 100 mg by mouth daily.      Marland Kitchen warfarin (COUMADIN) 5 MG tablet Take 2.5-5 mg by mouth daily. Take one tablet (2.5mg ) on Monday, Wednesday, and Friday. Take half-tablet (5mg ) on Tuesday, Thursday, Saturday, and Sunday.     No current facility-administered medications on file prior to visit.    No Known Allergies  Past Medical History  Diagnosis Date  . Coronary artery disease   . MI (myocardial infarction) ([place]) 2000    ANTERIOR, s/p PCI  . Ischemic cardiomyopathy     EF 23%  . Persistent atrial fibrillation ([facility])     on coumadin  . Chronic renal insufficiency   . Dyslipidemia   . Depression   . ED (erectile dysfunction)   . Other primary cardiomyopathies     Past Surgical History  Procedure Laterality Date  . Cardiac catheterization  02/05/2008    MODERATE TO SEVERE LEFT VENTRICULAR  DYSFUNCTION WITH EF 25-30%  . Coronary stent placement      LAD  .  Transthoracic echocardiogram  12/2010    EF 30-35%  . Cardiac defibrillator placement  2009    MDT BI-VENTRICULAR ICD by Dr Caryl Comes  . Wrist fracture surgery    . Breast lumpectomy    . Implantable cardioverter defibrillator generator change  2014  . Biv icd genertaor change out N/A 09/16/2013    Procedure: BIV ICD GENERTAOR CHANGE OUT;  Surgeon: [name], MD;  Location: [facility] CATH LAB;  Service: Cardiovascular;  Laterality: N/A;    History  Smoking status  . Former Smoker  . Types: Cigarettes  . Quit date: 12/23/1998  Smokeless tobacco  . Not on file    History  Alcohol Use No    Family History  Problem Relation Age of Onset  . Breast cancer Mother   . Depression Mother   . Heart attack Father   . Breast cancer Sister     Review  of Systems: As noted in history of present illness. All other systems were reviewed and are negative.  Physical Exam: BP 120/68 mmHg  Pulse 89  Ht 5\' 10"  (1.778 m)  Wt 105.688 kg (233 lb)  BMI 33.43 kg/m2 He is an overweight white male in no acute distress. HEENT: Unremarkable No JVD or bruits. Lungs: Clear Cardiovascular: Regular rate and rhythm, normal S1 and S2. Grade 2/6 systolic murmur at apex. Abdomen: Obese, soft, nontender. No masses or bruits. Extremities: Trace edema below the knees. Large venous varicosities. Skin: Warm and dry Neuro: Alert oriented x3. Cranial nerves II through XII are intact.  LABORATORY DATA: Ecg today shows ventricular paced rhythm with occ. PVCs. Rate 87 bpm. Underlying Afib. I have personally reviewed and interpreted this study.   Assessment / Plan: 1. Atrial fibrillation. Rate control appears to be adequate. Continue rate control and anticoagulation.  2. Coronary disease with remote anterior myocardial infarction. He is asymptomatic.  3. Chronic systolic congestive heart failure. He is on optimal therapy with carvedilol, ARB, diuretics, and Inspra. Further titration was limited by hypotension. Ejection fraction is 25-30% in July 2014. Will update Echo. Continue sodium restriction.   4. Cough. Improved off ACEi. Now on ARB.  5. Urinary frequency and nocturia. Per urology  6. S/p CRT-D. No follow up since Feb 2016. Will check with device clinic about appropriate follow up .  I will follow up in 6 months.

## 2015-12-29 ENCOUNTER — Telehealth: Payer: Self-pay | Admitting: Cardiology

## 2015-12-29 NOTE — Telephone Encounter (Signed)
Expand All Collapse All   Pt needs clearance for a TURP.Please fax clearance [phone] Att:Pam       Letter drafted and sent to give cardiac clearance and permission to hold coumadin for 5 days prior to surgery

## 2015-12-29 NOTE — Telephone Encounter (Signed)
He is cleared for TURP from a cardiac standpoint. He may hold coumadin for 5 days prior to surgery.  Shanin Szymanowski Martinique MD, [facility]

## 2015-12-29 NOTE — Telephone Encounter (Signed)
Routed request to Dr. Martinique for clearance for TURP

## 2015-12-29 NOTE — Telephone Encounter (Signed)
Pt needs clearance for a TURP.Please fax clearance [phone] Att:Pam

## 2015-12-30 ENCOUNTER — Telehealth: Payer: Self-pay | Admitting: Internal Medicine

## 2015-12-30 NOTE — Telephone Encounter (Signed)
Spoke w/ pt and instructed him to send a manual transmission from home monitor b/c we have not received one since December 2015. Pt verbalized understanding. I also informed pt that it is the MD's decision weather or not he needs to be seen before surgery. Instructed pt that it is ok to wait for 1 year follow appt w/ SK after surgery. Pt verbalized understanding.

## 2015-12-30 NOTE — Telephone Encounter (Signed)
Pt had appt 01-19-15, wants to know when he needs to be seen, I told him it was a year appt and was due in February, he wanted to talk to nurse before scheduling appt also has a question re transmitter [phone]

## 2016-01-01 ENCOUNTER — Telehealth: Payer: Self-pay

## 2016-01-01 NOTE — Telephone Encounter (Signed)
Patient called spoke to Parks at [facility]'s office she verified they have been receiving Dr.Jordan's office notes.

## 2016-01-04 ENCOUNTER — Telehealth: Payer: Self-pay

## 2016-01-04 NOTE — Telephone Encounter (Signed)
Received surgical clearance from [facility].Dr.Jordan cleared patient for surgery.Advised ok to hold coumadin 5 days prior to TURP.Form faxed back to fax # [phone].

## 2016-01-06 ENCOUNTER — Other Ambulatory Visit: Payer: Self-pay | Admitting: Urology

## 2016-01-06 ENCOUNTER — Other Ambulatory Visit: Payer: Self-pay

## 2016-01-06 ENCOUNTER — Ambulatory Visit (HOSPITAL_COMMUNITY): Payer: Medicare Other | Attending: Cardiovascular Disease

## 2016-01-06 DIAGNOSIS — I482 Chronic atrial fibrillation, unspecified: Secondary | ICD-10-CM

## 2016-01-06 DIAGNOSIS — I252 Old myocardial infarction: Secondary | ICD-10-CM | POA: Insufficient documentation

## 2016-01-06 DIAGNOSIS — I517 Cardiomegaly: Secondary | ICD-10-CM | POA: Diagnosis not present

## 2016-01-06 DIAGNOSIS — I10 Essential (primary) hypertension: Secondary | ICD-10-CM

## 2016-01-06 DIAGNOSIS — Z95810 Presence of automatic (implantable) cardiac defibrillator: Secondary | ICD-10-CM | POA: Insufficient documentation

## 2016-01-06 DIAGNOSIS — R29898 Other symptoms and signs involving the musculoskeletal system: Secondary | ICD-10-CM | POA: Diagnosis not present

## 2016-01-06 DIAGNOSIS — I340 Nonrheumatic mitral (valve) insufficiency: Secondary | ICD-10-CM | POA: Insufficient documentation

## 2016-01-06 DIAGNOSIS — I5022 Chronic systolic (congestive) heart failure: Secondary | ICD-10-CM | POA: Diagnosis present

## 2016-01-06 MED ORDER — PERFLUTREN LIPID MICROSPHERE
1.0000 mL | INTRAVENOUS | Status: AC | PRN
  Administered 2016-01-06: 2 mL via INTRAVENOUS

## 2016-01-11 ENCOUNTER — Telehealth: Payer: Self-pay | Admitting: Internal Medicine

## 2016-01-11 NOTE — Telephone Encounter (Deleted)
Error

## 2016-01-14 ENCOUNTER — Encounter: Payer: Self-pay | Admitting: Internal Medicine

## 2016-01-14 ENCOUNTER — Ambulatory Visit (INDEPENDENT_AMBULATORY_CARE_PROVIDER_SITE_OTHER): Payer: 59 | Admitting: *Deleted

## 2016-01-14 DIAGNOSIS — I5022 Chronic systolic (congestive) heart failure: Secondary | ICD-10-CM

## 2016-01-14 DIAGNOSIS — Z95810 Presence of automatic (implantable) cardiac defibrillator: Secondary | ICD-10-CM

## 2016-01-14 DIAGNOSIS — I255 Ischemic cardiomyopathy: Secondary | ICD-10-CM

## 2016-01-16 LAB — CUP PACEART INCLINIC DEVICE CHECK
Brady Statistic AP VP Percent: 0 %
Brady Statistic AP VS Percent: 0 %
Brady Statistic RV Percent Paced: 68.21 %
HIGH POWER IMPEDANCE MEASURED VALUE: 56 Ohm
HighPow Impedance: 75 Ohm
Implantable Lead Implant Date: 20090226
Implantable Lead Location: 753859
Lead Channel Impedance Value: 399 Ohm
Lead Channel Impedance Value: 608 Ohm
Lead Channel Impedance Value: 817 Ohm
Lead Channel Sensing Intrinsic Amplitude: 1.375 mV
Lead Channel Sensing Intrinsic Amplitude: 14.5 mV
Lead Channel Setting Pacing Amplitude: 1.75 V
Lead Channel Setting Sensing Sensitivity: 0.3 mV
MDC IDC LEAD IMPLANT DT: 20090226
MDC IDC LEAD IMPLANT DT: 20090226
MDC IDC LEAD LOCATION: 753858
MDC IDC LEAD LOCATION: 753860
MDC IDC LEAD MODEL: 4194
MDC IDC MSMT BATTERY REMAINING LONGEVITY: 59 mo
MDC IDC MSMT BATTERY VOLTAGE: 2.96 V
MDC IDC MSMT LEADCHNL LV IMPEDANCE VALUE: 342 Ohm
MDC IDC MSMT LEADCHNL LV PACING THRESHOLD AMPLITUDE: 0.75 V
MDC IDC MSMT LEADCHNL LV PACING THRESHOLD PULSEWIDTH: 0.6 ms
MDC IDC MSMT LEADCHNL RA IMPEDANCE VALUE: 551 Ohm
MDC IDC MSMT LEADCHNL RV IMPEDANCE VALUE: 342 Ohm
MDC IDC MSMT LEADCHNL RV PACING THRESHOLD AMPLITUDE: 0.75 V
MDC IDC MSMT LEADCHNL RV PACING THRESHOLD PULSEWIDTH: 0.6 ms
MDC IDC SESS DTM: 20170126225629
MDC IDC SET LEADCHNL LV PACING PULSEWIDTH: 0.6 ms
MDC IDC SET LEADCHNL RV PACING AMPLITUDE: 2.5 V
MDC IDC SET LEADCHNL RV PACING PULSEWIDTH: 0.6 ms
MDC IDC STAT BRADY AS VP PERCENT: 64.42 %
MDC IDC STAT BRADY AS VS PERCENT: 35.58 %
MDC IDC STAT BRADY RA PERCENT PACED: 0 %

## 2016-01-16 NOTE — Progress Notes (Signed)
CRT-D device check in office. Thresholds and sensing consistent with previous device measurements. Lead impedance trends stable over time. 1 AT/AF episode (100% burden), AF +warfarin. No ventricular arrhythmia episodes recorded. Patient bi-ventricularly pacing 68.2%, VSRP 30.7% due to AF. Device programmed with appropriate safety margins. Heart failure diagnostics reviewed and trends are stable for patient. Audible alerts demonstrated for patient. No changes made this session. Estimated longevity 4.9 years. Patient enrolled in remote follow up. Patient education completed including shock plan. ROV with SK in 3 months per patient preference (12 month f/u was due in 12/2015).

## 2016-01-18 ENCOUNTER — Telehealth: Payer: Self-pay | Admitting: *Deleted

## 2016-01-18 NOTE — Progress Notes (Signed)
01/15/2016-Perioperative Prescription for Implanted Cardiac Device Programming consent from on chart from Dr. Caryl Comes. 01/14/2016-noted last device check in EPIC. 12/29/2015-Pre-operative clearance from Dr. Peter Martinique on chart . 12/25/2015-noted EKG in EPIC.

## 2016-01-18 NOTE — Patient Instructions (Addendum)
[name]  01/18/2016   Your procedure is scheduled on: Monday 01/25/2016  Report to [facility] Main  Entrance take [place]  elevators to 3rd floor to  [place] at  [place] AM.  Call this number if you have problems the morning of surgery [phone]   Remember: ONLY 1 PERSON MAY GO WITH YOU TO SHORT STAY TO GET  READY MORNING OF [place].   Do not eat food or drink liquids :After Midnight.     Take these medicines the morning of surgery with A SIP OF WATER: Carvedilol (Coreg), Metoprolol, Sertraline (Zoloft), Digox,                                 You may not have any metal on your body including hair pins and              piercings  Do not wear jewelry, make-up, lotions, powders or perfumes, deodorant             Do not wear nail polish.  Do not shave  48 hours prior to surgery.              Men may shave face and neck.   Do not bring valuables to the hospital. [place].  Contacts, dentures or bridgework may not be worn into surgery.  Leave suitcase in the car. After surgery it may be brought to your room.                   Please read over the following fact sheets you were given: _____________________________________________________________________             [facility] - Preparing for Surgery Before surgery, you can play an important role.  Because skin is not sterile, your skin needs to be as free of germs as possible.  You can reduce the number of germs on your skin by washing with CHG (chlorahexidine gluconate) soap before surgery.  CHG is an antiseptic cleaner which kills germs and bonds with the skin to continue killing germs even after washing. Please DO NOT use if you have an allergy to CHG or antibacterial soaps.  If your skin becomes reddened/irritated stop using the CHG and inform your nurse when you arrive at Short Stay. Do not shave (including legs and underarms) for at  least 48 hours prior to the first CHG shower.  You may shave your face/neck. Please follow these instructions carefully:  1.  Shower with CHG Soap the night before surgery and the  morning of Surgery.  2.  If you choose to wash your hair, wash your hair first as usual with your  normal  shampoo.  3.  After you shampoo, rinse your hair and body thoroughly to remove the  shampoo.                           4.  Use CHG as you would any other liquid soap.  You can apply chg directly  to the skin and wash                       Gently with a scrungie  or clean washcloth.  5.  Apply the CHG Soap to your body ONLY FROM THE NECK DOWN.   Do not use on face/ open                           Wound or open sores. Avoid contact with eyes, ears mouth and genitals (private parts).                       Wash face,  Genitals (private parts) with your normal soap.             6.  Wash thoroughly, paying special attention to the area where your surgery  will be performed.  7.  Thoroughly rinse your body with warm water from the neck down.  8.  DO NOT shower/wash with your normal soap after using and rinsing off  the CHG Soap.                9.  Pat yourself dry with a clean towel.            10.  Wear clean pajamas.            11.  Place clean sheets on your bed the night of your first shower and do not  sleep with pets. Day of Surgery : Do not apply any lotions/deodorants the morning of surgery.  Please wear clean clothes to the hospital/surgery center.  FAILURE TO FOLLOW THESE INSTRUCTIONS MAY RESULT IN THE CANCELLATION OF YOUR SURGERY PATIENT SIGNATURE_________________________________  NURSE SIGNATURE__________________________________  ________________________________________________________________________   [name]  An incentive spirometer is a tool that can help keep your lungs clear and active. This tool measures how well you are filling your lungs with each breath. Taking long deep breaths  may help reverse or decrease the chance of developing breathing (pulmonary) problems (especially infection) following:  A long period of time when you are unable to move or be active. BEFORE THE PROCEDURE   If the spirometer includes an indicator to show your best effort, your nurse or respiratory therapist will set it to a desired goal.  If possible, sit up straight or lean slightly forward. Try not to slouch.  Hold the incentive spirometer in an upright position. INSTRUCTIONS FOR USE  1. Sit on the edge of your bed if possible, or sit up as far as you can in bed or on a chair. 2. Hold the incentive spirometer in an upright position. 3. Breathe out normally. 4. Place the mouthpiece in your mouth and seal your lips tightly around it. 5. Breathe in slowly and as deeply as possible, raising the piston or the ball toward the top of the column. 6. Hold your breath for 3-5 seconds or for as long as possible. Allow the piston or ball to fall to the bottom of the column. 7. Remove the mouthpiece from your mouth and breathe out normally. 8. Rest for a few seconds and repeat Steps 1 through 7 at least 10 times every 1-2 hours when you are awake. Take your time and take a few normal breaths between deep breaths. 9. The spirometer may include an indicator to show your best effort. Use the indicator as a goal to work toward during each repetition. 10. After each set of 10 deep breaths, practice coughing to be sure your lungs are clear. If you have an incision (the cut made at the time of surgery), support your incision when  coughing by placing a pillow or rolled up towels firmly against it. Once you are able to get out of bed, walk around indoors and cough well. You may stop using the incentive spirometer when instructed by your caregiver.  RISKS AND COMPLICATIONS  Take your time so you do not get dizzy or light-headed.  If you are in pain, you may need to take or ask for pain medication before doing  incentive spirometry. It is harder to take a deep breath if you are having pain. AFTER USE  Rest and breathe slowly and easily.  It can be helpful to keep track of a log of your progress. Your caregiver can provide you with a simple table to help with this. If you are using the spirometer at home, follow these instructions: [place] IF:   You are having difficultly using the spirometer.  You have trouble using the spirometer as often as instructed.  Your pain medication is not giving enough relief while using the spirometer.  You develop fever of 100.5 F (38.1 C) or higher. SEEK IMMEDIATE MEDICAL CARE IF:   You cough up bloody sputum that had not been present before.  You develop fever of 102 F (38.9 C) or greater.  You develop worsening pain at or near the incision site. MAKE SURE YOU:   Understand these instructions.  Will watch your condition.  Will get help right away if you are not doing well or get worse. Document Released: 04/17/2007 Document Revised: 02/27/2012 Document Reviewed: 06/18/2007 [facility] Patient Information 2014 [place], [place].   ________________________________________________________________________

## 2016-01-18 NOTE — Telephone Encounter (Signed)
Called patient to update him about error code 5007.  Per [employer], this code means that the analog phone adapter is not connecting to the monitor.  This could be due to phone line updates in his area.  Their recommendation is to either use monitor without the analog adapter or to change patient's DOI in [employer] temporarily so that a 2490 monitor and Wire[employer] can be ordered.  Patient requested that I call him back tomorrow afternoon to discuss.  He denies questions at this time.

## 2016-01-19 ENCOUNTER — Ambulatory Visit (HOSPITAL_COMMUNITY)
Admission: RE | Admit: 2016-01-19 | Discharge: 2016-01-19 | Disposition: A | Discharge status: Home / Self Care | Payer: Medicare Other | Source: Ambulatory Visit | Attending: Anesthesiology | Admitting: Anesthesiology

## 2016-01-19 ENCOUNTER — Encounter (HOSPITAL_COMMUNITY): Payer: Self-pay

## 2016-01-19 ENCOUNTER — Encounter (HOSPITAL_COMMUNITY)
Admission: RE | Admit: 2016-01-19 | Discharge: 2016-01-19 | Disposition: A | Discharge status: Home / Self Care | Payer: Medicare Other | Source: Ambulatory Visit | Attending: Urology | Admitting: Urology

## 2016-01-19 DIAGNOSIS — Z01818 Encounter for other preprocedural examination: Secondary | ICD-10-CM | POA: Diagnosis not present

## 2016-01-19 DIAGNOSIS — Z955 Presence of coronary angioplasty implant and graft: Secondary | ICD-10-CM | POA: Diagnosis not present

## 2016-01-19 DIAGNOSIS — I509 Heart failure, unspecified: Secondary | ICD-10-CM | POA: Diagnosis present

## 2016-01-19 DIAGNOSIS — Z87891 Personal history of nicotine dependence: Secondary | ICD-10-CM | POA: Diagnosis not present

## 2016-01-19 DIAGNOSIS — I2510 Atherosclerotic heart disease of native coronary artery without angina pectoris: Secondary | ICD-10-CM | POA: Insufficient documentation

## 2016-01-19 HISTORY — DX: Heart failure, unspecified: I50.9

## 2016-01-19 HISTORY — DX: Cardiac arrhythmia, unspecified: I49.9

## 2016-01-19 HISTORY — DX: Presence of automatic (implantable) cardiac defibrillator: Z95.810

## 2016-01-19 HISTORY — DX: Presence of cardiac pacemaker: Z95.0

## 2016-01-19 HISTORY — DX: Reserved for inherently not codable concepts without codable children: IMO0001

## 2016-01-19 LAB — CBC
HCT: 46.4 % (ref 39.0–52.0)
Hemoglobin: 14.9 g/dL (ref 13.0–17.0)
MCH: 27 pg (ref 26.0–34.0)
MCHC: 32.1 g/dL (ref 30.0–36.0)
MCV: 84.2 fL (ref 78.0–100.0)
PLATELETS: 219 10*3/uL (ref 150–400)
RBC: 5.51 MIL/uL (ref 4.22–5.81)
RDW: 15.1 % (ref 11.5–15.5)
WBC: 7 10*3/uL (ref 4.0–10.5)

## 2016-01-19 LAB — BASIC METABOLIC PANEL
Anion gap: 7 (ref 5–15)
BUN: 32 mg/dL — AB (ref 6–20)
CO2: 29 mmol/L (ref 22–32)
Calcium: 9.7 mg/dL (ref 8.9–10.3)
Chloride: 105 mmol/L (ref 101–111)
Creatinine, Ser: 1.65 mg/dL — ABNORMAL HIGH (ref 0.61–1.24)
GFR calc Af Amer: 48 mL/min — ABNORMAL LOW (ref >60)
GFR, EST NON AFRICAN AMERICAN: 41 mL/min — AB (ref >60)
GLUCOSE: 98 mg/dL (ref 65–99)
POTASSIUM: 4.3 mmol/L (ref 3.5–5.1)
Sodium: 141 mmol/L (ref 135–145)

## 2016-01-19 LAB — PSA: PSA: 3.19 ng/mL (ref 0.00–4.00)

## 2016-01-19 LAB — PROTIME-INR
INR: 2.65 — ABNORMAL HIGH (ref 0.00–1.49)
PROTHROMBIN TIME: 27.9 s — AB (ref 11.6–15.2)

## 2016-01-19 LAB — APTT: APTT: 37 s (ref 24–37)

## 2016-01-19 NOTE — Progress Notes (Signed)
   01/19/16 1108  OBSTRUCTIVE SLEEP APNEA  Have you ever been diagnosed with sleep apnea through a sleep study? No  Do you snore loudly (loud enough to be heard through closed doors)?  1  Do you often feel tired, fatigued, or sleepy during the daytime (such as falling asleep during driving or talking to someone)? 1  Has anyone observed you stop breathing during your sleep? 0  Do you have, or are you being treated for high blood pressure? 0  BMI more than 35 kg/m2? 0  Age > 50 (1-yes) 1  Neck circumference greater than:Male 16 inches or larger, Male 17inches or larger? 1  Male Gender (Yes=1) 1  Obstructive Sleep Apnea Score 5  Score 5 or greater  Results sent to PCP

## 2016-01-19 NOTE — Telephone Encounter (Signed)
Called patient back to update him about the monitor troubleshooting options.  He will attempt to send a manual transmission without the analog adapter tonight and I will call him back tomorrow to let him know if it was received.  Patient verbalizes understanding of instructions and denies questions or concerns at this time.

## 2016-01-22 NOTE — Telephone Encounter (Signed)
Called patient back to let him know that transmission was successfully received.  Advised that we should anticipate that monitor will transmit automatically on remote transmission dates, but that he will receive a call on the day his transmission is due if it is not received automatically.  Patient is aware that he is due for follow-up with Dr. Caryl Comes.  He states he will call our office "soon" to schedule this appointment.  3 month recall letter added.  Patient is appreciative of call and denies questions or concerns at this time.

## 2016-01-24 MED ORDER — GENTAMICIN SULFATE 40 MG/ML IJ SOLN
420.0000 mg | INTRAVENOUS | Status: DC
  Filled 2016-01-24: qty 10.5

## 2016-01-24 MED ORDER — GENTAMICIN SULFATE 40 MG/ML IJ SOLN
440.0000 mg | INTRAVENOUS | Status: AC
  Administered 2016-01-25: 440 mg via INTRAVENOUS
  Filled 2016-01-24 (×2): qty 11

## 2016-01-25 ENCOUNTER — Inpatient Hospital Stay (HOSPITAL_COMMUNITY): Payer: Medicare Other | Admitting: Certified Registered"

## 2016-01-25 ENCOUNTER — Observation Stay (HOSPITAL_COMMUNITY)
Admission: RE | Admit: 2016-01-25 | Discharge: 2016-01-26 | Disposition: A | Discharge status: Home Health | Payer: Medicare Other | Source: Ambulatory Visit | Attending: Urology | Admitting: Urology

## 2016-01-25 ENCOUNTER — Encounter (HOSPITAL_COMMUNITY)
Admission: RE | Disposition: A | Discharge status: Home Health | Payer: Self-pay | Source: Ambulatory Visit | Attending: Urology

## 2016-01-25 ENCOUNTER — Encounter (HOSPITAL_COMMUNITY): Payer: Self-pay | Admitting: *Deleted

## 2016-01-25 DIAGNOSIS — K219 Gastro-esophageal reflux disease without esophagitis: Secondary | ICD-10-CM | POA: Diagnosis not present

## 2016-01-25 DIAGNOSIS — N138 Other obstructive and reflux uropathy: Secondary | ICD-10-CM | POA: Insufficient documentation

## 2016-01-25 DIAGNOSIS — N418 Other inflammatory diseases of prostate: Secondary | ICD-10-CM | POA: Insufficient documentation

## 2016-01-25 DIAGNOSIS — I509 Heart failure, unspecified: Secondary | ICD-10-CM | POA: Diagnosis not present

## 2016-01-25 DIAGNOSIS — I2510 Atherosclerotic heart disease of native coronary artery without angina pectoris: Secondary | ICD-10-CM | POA: Diagnosis not present

## 2016-01-25 DIAGNOSIS — I4891 Unspecified atrial fibrillation: Secondary | ICD-10-CM | POA: Insufficient documentation

## 2016-01-25 DIAGNOSIS — G4730 Sleep apnea, unspecified: Secondary | ICD-10-CM | POA: Insufficient documentation

## 2016-01-25 DIAGNOSIS — Z950 Presence of cardiac pacemaker: Secondary | ICD-10-CM | POA: Insufficient documentation

## 2016-01-25 DIAGNOSIS — F419 Anxiety disorder, unspecified: Secondary | ICD-10-CM | POA: Diagnosis not present

## 2016-01-25 DIAGNOSIS — N400 Enlarged prostate without lower urinary tract symptoms: Secondary | ICD-10-CM | POA: Diagnosis present

## 2016-01-25 DIAGNOSIS — Z79899 Other long term (current) drug therapy: Secondary | ICD-10-CM | POA: Diagnosis not present

## 2016-01-25 DIAGNOSIS — Z87891 Personal history of nicotine dependence: Secondary | ICD-10-CM | POA: Diagnosis not present

## 2016-01-25 DIAGNOSIS — N401 Enlarged prostate with lower urinary tract symptoms: Principal | ICD-10-CM | POA: Insufficient documentation

## 2016-01-25 DIAGNOSIS — I110 Hypertensive heart disease with heart failure: Secondary | ICD-10-CM | POA: Diagnosis not present

## 2016-01-25 DIAGNOSIS — R39198 Other difficulties with micturition: Secondary | ICD-10-CM | POA: Insufficient documentation

## 2016-01-25 DIAGNOSIS — I252 Old myocardial infarction: Secondary | ICD-10-CM | POA: Diagnosis not present

## 2016-01-25 DIAGNOSIS — Z955 Presence of coronary angioplasty implant and graft: Secondary | ICD-10-CM | POA: Diagnosis not present

## 2016-01-25 DIAGNOSIS — I5020 Unspecified systolic (congestive) heart failure: Secondary | ICD-10-CM | POA: Insufficient documentation

## 2016-01-25 DIAGNOSIS — Z7901 Long term (current) use of anticoagulants: Secondary | ICD-10-CM | POA: Diagnosis not present

## 2016-01-25 DIAGNOSIS — F329 Major depressive disorder, single episode, unspecified: Secondary | ICD-10-CM | POA: Insufficient documentation

## 2016-01-25 HISTORY — PX: CYSTOSCOPY: SHX5120

## 2016-01-25 HISTORY — PX: TRANSURETHRAL RESECTION OF PROSTATE: SHX73

## 2016-01-25 LAB — PROTIME-INR
INR: 1.29 (ref 0.00–1.49)
PROTHROMBIN TIME: 16.3 s — AB (ref 11.6–15.2)

## 2016-01-25 LAB — APTT: APTT: 28 s (ref 24–37)

## 2016-01-25 SURGERY — CYSTOSCOPY
Anesthesia: General

## 2016-01-25 MED ORDER — ZOLPIDEM TARTRATE 5 MG PO TABS: 5.0000 mg | ORAL_TABLET | Freq: Every evening | ORAL | Status: DC | PRN

## 2016-01-25 MED ORDER — SPIRONOLACTONE 25 MG PO TABS
25.0000 mg | ORAL_TABLET | Freq: Every day | ORAL | Status: DC
  Administered 2016-01-25 – 2016-01-26 (×2): 25 mg via ORAL
  Filled 2016-01-25 (×2): qty 1

## 2016-01-25 MED ORDER — HYDROMORPHONE HCL 1 MG/ML IJ SOLN: 0.5000 mg | INTRAMUSCULAR | Status: DC | PRN

## 2016-01-25 MED ORDER — SODIUM CHLORIDE 0.9 % IR SOLN
Status: DC | PRN
  Administered 2016-01-25: 12000 mL

## 2016-01-25 MED ORDER — LACTATED RINGERS IV SOLN
INTRAVENOUS | Status: DC | PRN
  Administered 2016-01-25: 10:00:00 via INTRAVENOUS

## 2016-01-25 MED ORDER — BELLADONNA ALKALOIDS-OPIUM 16.2-60 MG RE SUPP
RECTAL | Status: AC
  Filled 2016-01-25: qty 1

## 2016-01-25 MED ORDER — SULFAMETHOXAZOLE-TRIMETHOPRIM 800-160 MG PO TABS
1.0000 | ORAL_TABLET | Freq: Two times a day (BID) | ORAL | Status: DC
  Administered 2016-01-25 – 2016-01-26 (×3): 1 via ORAL
  Filled 2016-01-25 (×4): qty 1

## 2016-01-25 MED ORDER — CARVEDILOL 25 MG PO TABS
25.0000 mg | ORAL_TABLET | Freq: Two times a day (BID) | ORAL | Status: DC
  Administered 2016-01-25 – 2016-01-26 (×2): 25 mg via ORAL
  Filled 2016-01-25 (×2): qty 1

## 2016-01-25 MED ORDER — FENTANYL CITRATE (PF) 100 MCG/2ML IJ SOLN
INTRAMUSCULAR | Status: AC
  Filled 2016-01-25: qty 2

## 2016-01-25 MED ORDER — FENTANYL CITRATE (PF) 100 MCG/2ML IJ SOLN
25.0000 ug | INTRAMUSCULAR | Status: DC | PRN
  Administered 2016-01-25: 50 ug via INTRAVENOUS
  Administered 2016-01-25 (×2): 25 ug via INTRAVENOUS

## 2016-01-25 MED ORDER — FOLIC ACID 1 MG PO TABS
1.0000 mg | ORAL_TABLET | Freq: Every day | ORAL | Status: DC
  Administered 2016-01-25 – 2016-01-26 (×2): 1 mg via ORAL
  Filled 2016-01-25 (×2): qty 1

## 2016-01-25 MED ORDER — NITROGLYCERIN 0.4 MG SL SUBL: 0.4000 mg | SUBLINGUAL_TABLET | SUBLINGUAL | Status: DC | PRN

## 2016-01-25 MED ORDER — BELLADONNA ALKALOIDS-OPIUM 16.2-60 MG RE SUPP
RECTAL | Status: DC | PRN
  Administered 2016-01-25: 1 via RECTAL

## 2016-01-25 MED ORDER — LORAZEPAM 1 MG PO TABS: 1.0000 mg | ORAL_TABLET | Freq: Three times a day (TID) | ORAL | Status: DC | PRN

## 2016-01-25 MED ORDER — PHENYLEPHRINE 40 MCG/ML (10ML) SYRINGE FOR IV PUSH (FOR BLOOD PRESSURE SUPPORT)
PREFILLED_SYRINGE | INTRAVENOUS | Status: AC
  Filled 2016-01-25: qty 10

## 2016-01-25 MED ORDER — ETOMIDATE 2 MG/ML IV SOLN
INTRAVENOUS | Status: DC | PRN
  Administered 2016-01-25: 16 mg via INTRAVENOUS

## 2016-01-25 MED ORDER — DEXAMETHASONE SODIUM PHOSPHATE 10 MG/ML IJ SOLN
INTRAMUSCULAR | Status: DC | PRN
  Administered 2016-01-25: 5 mg via INTRAVENOUS

## 2016-01-25 MED ORDER — 0.9 % SODIUM CHLORIDE (POUR BTL) OPTIME
TOPICAL | Status: DC | PRN
  Administered 2016-01-25: 1000 mL

## 2016-01-25 MED ORDER — SODIUM CHLORIDE 0.45 % IV SOLN
INTRAVENOUS | Status: DC
  Administered 2016-01-25: 14:00:00 via INTRAVENOUS

## 2016-01-25 MED ORDER — MIDAZOLAM HCL 2 MG/2ML IJ SOLN
INTRAMUSCULAR | Status: AC
  Filled 2016-01-25: qty 2

## 2016-01-25 MED ORDER — POTASSIUM CHLORIDE CRYS ER 20 MEQ PO TBCR
20.0000 meq | EXTENDED_RELEASE_TABLET | Freq: Once | ORAL | Status: AC
  Administered 2016-01-25: 20 meq via ORAL
  Filled 2016-01-25: qty 1

## 2016-01-25 MED ORDER — PHENYLEPHRINE HCL 10 MG/ML IJ SOLN
INTRAMUSCULAR | Status: DC | PRN
  Administered 2016-01-25 (×2): 80 ug via INTRAVENOUS

## 2016-01-25 MED ORDER — OXYCODONE-ACETAMINOPHEN 5-325 MG PO TABS: 1.0000 | ORAL_TABLET | ORAL | Status: DC | PRN

## 2016-01-25 MED ORDER — FENOFIBRATE 160 MG PO TABS
160.0000 mg | ORAL_TABLET | Freq: Every day | ORAL | Status: DC
  Administered 2016-01-25 – 2016-01-26 (×2): 160 mg via ORAL
  Filled 2016-01-25 (×2): qty 1

## 2016-01-25 MED ORDER — STERILE WATER FOR IRRIGATION IR SOLN
Status: DC | PRN
  Administered 2016-01-25: 30 mL

## 2016-01-25 MED ORDER — LOSARTAN POTASSIUM 25 MG PO TABS
25.0000 mg | ORAL_TABLET | Freq: Every day | ORAL | Status: DC
  Administered 2016-01-25 – 2016-01-26 (×2): 25 mg via ORAL
  Filled 2016-01-25 (×2): qty 1

## 2016-01-25 MED ORDER — KETOROLAC TROMETHAMINE 15 MG/ML IJ SOLN
15.0000 mg | Freq: Four times a day (QID) | INTRAMUSCULAR | Status: DC
Start: 2016-01-25 — End: 2016-01-26
  Administered 2016-01-25 – 2016-01-26 (×4): 15 mg via INTRAVENOUS
  Filled 2016-01-25 (×8): qty 1

## 2016-01-25 MED ORDER — SENNOSIDES-DOCUSATE SODIUM 8.6-50 MG PO TABS
2.0000 | ORAL_TABLET | Freq: Every day | ORAL | Status: DC
  Administered 2016-01-25: 2 via ORAL
  Filled 2016-01-25: qty 2

## 2016-01-25 MED ORDER — CEFAZOLIN SODIUM-DEXTROSE 2-3 GM-% IV SOLR
2.0000 g | INTRAVENOUS | Status: AC
  Administered 2016-01-25: 2 g via INTRAVENOUS

## 2016-01-25 MED ORDER — DIGOXIN 125 MCG PO TABS
0.1250 mg | ORAL_TABLET | Freq: Every day | ORAL | Status: DC
  Administered 2016-01-25 – 2016-01-26 (×2): 0.125 mg via ORAL
  Filled 2016-01-25 (×2): qty 1

## 2016-01-25 MED ORDER — LIDOCAINE HCL (CARDIAC) 20 MG/ML IV SOLN
INTRAVENOUS | Status: DC | PRN
  Administered 2016-01-25: 80 mg via INTRAVENOUS

## 2016-01-25 MED ORDER — FUROSEMIDE 20 MG PO TABS
20.0000 mg | ORAL_TABLET | Freq: Every day | ORAL | Status: DC
  Administered 2016-01-25 – 2016-01-26 (×2): 20 mg via ORAL
  Filled 2016-01-25 (×2): qty 1

## 2016-01-25 MED ORDER — ACETAMINOPHEN 500 MG PO TABS
1000.0000 mg | ORAL_TABLET | Freq: Four times a day (QID) | ORAL | Status: DC
  Administered 2016-01-25 – 2016-01-26 (×3): 1000 mg via ORAL
  Filled 2016-01-25 (×6): qty 2

## 2016-01-25 MED ORDER — BACITRACIN-NEOMYCIN-POLYMYXIN 400-5-5000 EX OINT: 1.0000 "application " | TOPICAL_OINTMENT | Freq: Three times a day (TID) | CUTANEOUS | Status: DC | PRN

## 2016-01-25 MED ORDER — SERTRALINE HCL 100 MG PO TABS
100.0000 mg | ORAL_TABLET | Freq: Every day | ORAL | Status: DC
  Administered 2016-01-26: 100 mg via ORAL
  Filled 2016-01-25: qty 1

## 2016-01-25 MED ORDER — PROPOFOL 10 MG/ML IV BOLUS
INTRAVENOUS | Status: AC
  Filled 2016-01-25: qty 20

## 2016-01-25 MED ORDER — ETOMIDATE 2 MG/ML IV SOLN
INTRAVENOUS | Status: AC
  Filled 2016-01-25: qty 10

## 2016-01-25 MED ORDER — DEXAMETHASONE SODIUM PHOSPHATE 10 MG/ML IJ SOLN
INTRAMUSCULAR | Status: AC
  Filled 2016-01-25: qty 1

## 2016-01-25 MED ORDER — ONDANSETRON HCL 4 MG/2ML IJ SOLN: 4.0000 mg | INTRAMUSCULAR | Status: DC | PRN

## 2016-01-25 MED ORDER — ONDANSETRON HCL 4 MG/2ML IJ SOLN
INTRAMUSCULAR | Status: DC | PRN
  Administered 2016-01-25 (×4): 2 mg via INTRAVENOUS

## 2016-01-25 MED ORDER — PRAVASTATIN SODIUM 40 MG PO TABS
40.0000 mg | ORAL_TABLET | Freq: Every day | ORAL | Status: DC
  Administered 2016-01-25 – 2016-01-26 (×2): 40 mg via ORAL
  Filled 2016-01-25 (×2): qty 1

## 2016-01-25 MED ORDER — PHENYLEPHRINE HCL 10 MG/ML IJ SOLN
10.0000 mg | INTRAVENOUS | Status: DC | PRN
  Administered 2016-01-25: 40 ug/min via INTRAVENOUS

## 2016-01-25 MED ORDER — METOPROLOL SUCCINATE ER 25 MG PO TB24
25.0000 mg | ORAL_TABLET | Freq: Every day | ORAL | Status: DC
  Administered 2016-01-26: 25 mg via ORAL
  Filled 2016-01-25: qty 1

## 2016-01-25 MED ORDER — FENTANYL CITRATE (PF) 100 MCG/2ML IJ SOLN
INTRAMUSCULAR | Status: DC | PRN
  Administered 2016-01-25: 25 ug via INTRAVENOUS
  Administered 2016-01-25: 50 ug via INTRAVENOUS
  Administered 2016-01-25: 25 ug via INTRAVENOUS

## 2016-01-25 MED ORDER — PROPOFOL 10 MG/ML IV BOLUS
INTRAVENOUS | Status: DC | PRN
  Administered 2016-01-25: 40 mg via INTRAVENOUS

## 2016-01-25 MED ORDER — CEFAZOLIN SODIUM-DEXTROSE 2-3 GM-% IV SOLR
INTRAVENOUS | Status: AC
  Filled 2016-01-25: qty 50

## 2016-01-25 MED ORDER — ONDANSETRON HCL 4 MG/2ML IJ SOLN
INTRAMUSCULAR | Status: AC
  Filled 2016-01-25: qty 4

## 2016-01-25 MED ORDER — CETYLPYRIDINIUM CHLORIDE 0.05 % MT LIQD
7.0000 mL | Freq: Two times a day (BID) | OROMUCOSAL | Status: DC
  Administered 2016-01-25 – 2016-01-26 (×3): 7 mL via OROMUCOSAL

## 2016-01-25 SURGICAL SUPPLY — 16 items
BAG URINE DRAINAGE (UROLOGICAL SUPPLIES) IMPLANT
BAG URO CATCHER STRL LF (MISCELLANEOUS) ×3 IMPLANT
CATH HEMA 3WAY 30CC 22FR COUDE (CATHETERS) ×3 IMPLANT
CLOTH BEACON ORANGE TIMEOUT ST (SAFETY) ×3 IMPLANT
GLOVE BIOGEL M STRL SZ7.5 (GLOVE) ×3 IMPLANT
GOWN STRL REUS W/TWL LRG LVL3 (GOWN DISPOSABLE) ×3 IMPLANT
GOWN STRL REUS W/TWL XL LVL3 (GOWN DISPOSABLE) ×3 IMPLANT
HOLDER FOLEY CATH W/STRAP (MISCELLANEOUS) IMPLANT
KIT ASPIRATION TUBING (SET/KITS/TRAYS/PACK) ×1 IMPLANT
LOOP CUT BIPOLAR 24F LRG (ELECTROSURGICAL) ×2 IMPLANT
MANIFOLD NEPTUNE II (INSTRUMENTS) ×3 IMPLANT
PACK CYSTO (CUSTOM PROCEDURE TRAY) ×3 IMPLANT
SYR 30ML LL (SYRINGE) IMPLANT
SYRINGE IRR TOOMEY STRL 70CC (SYRINGE) ×3 IMPLANT
TUBING CONNECTING 10 (TUBING) ×4 IMPLANT
TUBING CONNECTING 10' (TUBING) ×2

## 2016-01-25 NOTE — H&P (Signed)
Reason For Visit Cystoscopy, PUS and review urodynamics   Active Problems Problems  1. 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 Intermittent urinary stream (R39.13)  History of Present Illness 70 male returns today for a cystoscopy, prostate u/s and to review urodynamic results. Originally referred by Dr. Tollie Pizza for evaluation of bladder outlet symptoms, with international prostate symptom score of 22. He complains of urinary urgency, frequency, nocturia, and urinary incontinence. His symptoms are influenced by taking Lasix 20 mg by mouth per day. On physical examination he has a 4+ prostate. He has an implantable pacemaker defibrillator.    Urodynamics are preformed in the sitting position, with maximum cystometric capacity of 175 cc. The first sensation of filling occurred at 78 cc with normal desire at 158 cc strong desire at 159 cc. The bladder does have an unstable bladder contraction at that occurs at 141 cc, with an unstable bladder contraction pressure of 78 cm of water pressure. The patient voids off this contraction.    The patient did not leak for leak point pressure determination, and abdominal pressure generation of 101 7 L of water pressure.    Pressure flow study shows a voided volume of 137 cc with a maximum flow rate of 7 cc/s and a detrusor pressure at maximum flow of 64 cm of water, and a post void residual of 20 cc/s. The flow pattern is obstructed.    VCUG shows trabeculation, but no reflux. Multiple diverticuli are noted. There were no bladder stones noted.    The patient has a maximum capacity which is small, of approximately 175 cc. He demonstrates instability, with multiple unstable contractions during the study. He voids off his unstable contraction. After refilling his bladder, he is able to generate a voluntary contraction, and voids with an obstructed flow pattern. He is able to empty somewhat efficiently, and  leaves only 20 cc residual. He has trabeculation, and diverticula, noted on cystogram. There is no reflux.   Past Medical History Problems  1. History of Anxiety (F41.9) 2. History of atrial fibrillation (Z86.79) 3. History of cardiac disorder (Z86.79) 4. History of depression (Z86.59) 5. History of esophageal reflux (Z87.19) 6. History of heart failure (Z86.79) 7. History of myocardial infarction (I25.2)  Surgical History Problems  1. History of Heart Surgery 2. History of Implantable Cardioverter-Defibrillator  Current Meds 1. Carvedilol 6.25 MG Oral Tablet;  Therapy: (Recorded:04Nov2016) to Recorded 2. Eplerenone 25 MG Oral Tablet;  Therapy: (Recorded:04Nov2016) to Recorded 3. Fenofibrate 160 MG Oral Tablet;  Therapy: (Recorded:04Nov2016) to Recorded 4. Folic Acid TABS;  Therapy: (Recorded:04Nov2016) to Recorded 5. Furosemide 20 MG Oral Tablet;  Therapy: (Recorded:04Nov2016) to Recorded 6. LORazepam 1 MG Oral Tablet;  Therapy: (Recorded:04Nov2016) to Recorded 7. Losartan Potassium 25 MG Oral Tablet;  Therapy: (Recorded:04Nov2016) to Recorded 8. Metoprolol Succinate ER TB24;  Therapy: (Recorded:04Nov2016) to Recorded 9. Multi Vitamin/Minerals TABS;  Therapy: (Recorded:04Nov2016) to Recorded 10. Nitrostat SUBL;   Therapy: (Recorded:04Nov2016) to Recorded 11. Potassium TABS;   Therapy: (Recorded:04Nov2016) to Recorded 12. Pravastatin Sodium 40 MG Oral Tablet;   Therapy: (Recorded:04Nov2016) to Recorded 13. Sertraline HCl - 100 MG Oral Tablet;   Therapy: (Recorded:04Nov2016) to Recorded 14. Vitamin C TABS;   Therapy: (Recorded:04Nov2016) to Recorded 15. Warfarin Sodium TABS;   Therapy: (Recorded:04Nov2016) to Recorded  Allergies Medication  1. No Known Drug Allergies  Family History Problems  1. Family history of myocardial infarction (Z82.49) : Father, Paternal Grandmother  Social History Problems  1. Denied: History of Alcohol use 2.  Denied: History of  Caffeine use 3. Former smoker [phone])   1 ppd/25 years- quit 2000 4. Married 5. Occupation   mfg designer  Review of Systems Genitourinary, constitutional, skin, eye, otolaryngeal, hematologic/lymphatic, cardiovascular, pulmonary, endocrine, musculoskeletal, gastrointestinal, neurological and psychiatric system(s) were reviewed and pertinent findings if present are noted and are otherwise negative.  Genitourinary: urinary frequency, feelings of urinary urgency, nocturia, incontinence, difficulty starting the urinary stream, weak urinary stream, urinary stream starts and stops, hematuria and erectile dysfunction.  Cardiovascular: leg swelling.  Psychiatric: depression.    Vitals Vital Signs [Data Includes: Last 1 Day]  Recorded: [id number] 02:12PM  Blood Pressure: 149 / 72 Temperature: 98.1 F Heart Rate: 88  Physical Exam Constitutional: Well nourished and well developed . No acute distress.  ENT:. The ears and nose are normal in appearance.  Neck: The appearance of the neck is normal and no neck mass is present.  Cardiovascular:. No peripheral edema.  Abdomen: The abdomen is mildly obese, but not distended. The abdomen is soft and nontender. No masses are palpated. The abdomen is normal to percussion. No CVA tenderness. No hernias are palpable. No hepatosplenomegaly noted.  Rectal: Rectal exam demonstrates normal sphincter tone. Estimated prostate size is 4+. Normal rectal tone, no rectal masses, prostate is smooth, symmetric and non-tender.  Genitourinary: Examination of the penis demonstrates no discharge, no masses, no lesions and a normal meatus. The penis is uncircumcised. The scrotum is without lesions. Examination of the right scrotum demonstrates no mass. Examination of the left scrotum demostrates no mass. The right vas deferens is is palpably normal. The left vas deferens is palpably normal. The right epididymis is palpably normal and non-tender. The left epididymis is  palpably normal and non-tender. The right testis is palpably normal, non-tender and without masses. The left testis is normal, non-tender and without masses.  Lymphatics: The femoral and inguinal nodes are not enlarged or tender.  Skin: Normal skin turgor, no visible rash and no visible skin lesions.  Neuro/Psych:. Mood and affect are appropriate.    Results/Data Urine [Data Includes: Last 1 Day]   [id number]  COLOR YELLOW   APPEARANCE CLEAR   SPECIFIC GRAVITY 1.020   pH 5.0   GLUCOSE NEGATIVE   BILIRUBIN NEGATIVE   KETONE NEGATIVE   BLOOD 1+   PROTEIN NEGATIVE   NITRITE NEGATIVE   LEUKOCYTE ESTERASE NEGATIVE   SQUAMOUS EPITHELIAL/HPF NONE SEEN HPF  WBC 0-5 WBC/HPF  RBC 0-2 RBC/HPF  BACTERIA NONE SEEN HPF  CRYSTALS NONE SEEN HPF  CASTS NONE SEEN LPF  Yeast NONE SEEN HPF   Procedure Prostate u/s today: Length - 4.97cm, Height - 4.20cm and Width - 4.66cm. Total volume - 50.99 grams. Some calcifications were noted.   Procedure: Cystoscopy  Chaperone Present: kim lewis.  Indication: Lower Urinary Tract Symptoms.  Informed Consent: Risks, benefits, and potential adverse events were discussed and informed consent was obtained from the patient.  Prep: The patient was prepped with betadine.  Anesthesia:. Local anesthesia was administered intraurethrally with 2% lidocaine jelly.  Antibiotic prophylaxis: Ciprofloxacin.  Procedure Note:  Urethral meatus:. No abnormalities.  Anterior urethra: No abnormalities.  Prostatic urethra:. The lateral and median prostatic lobes were enlarged. An enlarged intravesical median lobe was visualized.  Bladder: The ureteral orifices were in the normal anatomic position bilaterally. The mucosa was smooth without abnormalities. Examination of the bladder demonstrated severe, grade 3 trabeculation and a diverticulum, but no clot within the bladder cellules, but no fistula, no erythematous mucosa, no ulcer and no  edema. The patient tolerated the procedure  well.  Complications: None.    Assessment Assessed  1. 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 Intermittent urinary stream (R39.13)  70 yo male with significant bladder outlet obstruction, with significant median lobe and lateral lobes on cystoscopy, and significant IPSS= 22, and poor voiding with peak flow of 7cc/sec ( nml 25-35cc/sec; with high pressures of 137cm H20-should be 25-35cm H20).    We have reviewed the urodynamic findings, and have discussed the findings of trabeculation and cellules and diverticular formation.We have discussed his symptoms and his incontinence ( urge), and need for TURP. He is unsure if he has had PSA done this year. I will ask pt to get 2016 labs from Dr. Marlyn Corporal. ( PSA and kidney function). ( 45 minutes)   Plan Benign localized hyperplasia of prostate with urinary obstruction  1. Follow-up Schedule Surgery Office  Follow-up  Status: Hold For - Appointment   Requested for: [id number] Health Maintenance  2. UA With REFLEX; [Do Not Release]; Status:Resulted - Requires Verification;   [id number] 01:39PM  1. Pt to see Dr. Peter Martinique re: Coumadin around the time of TURP in pt with pacemaker/defibrillator.   2. Dr. Jerilynn Birkenhead: arranges Coumadin.   Discussion/Summary 1. Dr. Peter Martinique  2. Dr. Jerilynn Birkenhead     Signatures Electronically signed by : Carolan Clines, M.D.; Dec 28 2015  3:23PM EST

## 2016-01-25 NOTE — Transfer of Care (Signed)
Immediate Anesthesia Transfer of Care Note  Patient: [name]  Procedure(s) Performed: Procedure(s): CYSTOSCOPY (N/A) TRANSURETHRAL RESECTION OF THE PROSTATE (TURP) (N/A)  Patient Location: PACU  Anesthesia Type:General  Level of Consciousness:  sedated, patient cooperative and responds to stimulation  Airway & Oxygen Therapy:Patient Spontanous Breathing and Patient connected to face mask oxgen  Post-op Assessment:  Report given to PACU RN and Post -op Vital signs reviewed and stable  Post vital signs:  Reviewed and stable  Last Vitals:  Filed Vitals:   01/25/16 0808  BP: 115/60  Pulse: 80  Temp: 36.4 C  Resp: 18    Complications: No apparent anesthesia complications

## 2016-01-25 NOTE — Anesthesia Preprocedure Evaluation (Addendum)
Anesthesia Evaluation  Patient identified by MRN, date of birth, ID band Patient awake    Reviewed: Allergy & Precautions, H&P , NPO status , Patient's Chart, lab work & pertinent test results, reviewed documented beta blocker date and time   Airway Mallampati: II  TM Distance: >3 FB Neck ROM: full    Dental  (+) Dental Advisory Given, Missing Left upper front lateral incisor missing and one lower front tooth:   Pulmonary shortness of breath and with exertion, sleep apnea , former smoker,    Pulmonary exam normal breath sounds clear to auscultation       Cardiovascular hypertension, Pt. on home beta blockers and Pt. on medications + CAD, + Past MI, + Cardiac Stents and +CHF  Normal cardiovascular exam+ dysrhythmias Atrial Fibrillation + pacemaker + Cardiac Defibrillator  Rhythm:regular Rate:Normal  Systolic CHF.  EF 25%   Neuro/Psych negative neurological ROS  negative psych ROS   GI/Hepatic negative GI ROS, Neg liver ROS,   Endo/Other  negative endocrine ROS  Renal/GU Renal diseaseCRT 1.65  negative genitourinary   Musculoskeletal   Abdominal   Peds  Hematology negative hematology ROS (+)   Anesthesia Other Findings   Reproductive/Obstetrics negative OB ROS                           Anesthesia Physical Anesthesia Plan  ASA: IV  Anesthesia Plan: General   Post-op Pain Management:    Induction: Intravenous  Airway Management Planned: LMA  Additional Equipment:   Intra-op Plan:   Post-operative Plan:   Informed Consent: I have reviewed the patients History and Physical, chart, labs and discussed the procedure including the risks, benefits and alternatives for the proposed anesthesia with the patient or authorized representative who has indicated his/her understanding and acceptance.   Dental Advisory Given  Plan Discussed with: CRNA and Surgeon  Anesthesia Plan Comments:          Anesthesia Quick Evaluation

## 2016-01-25 NOTE — Interval H&P Note (Signed)
History and Physical Interval Note:  01/25/2016 10:55 AM  [name]  has presented today for surgery, with the diagnosis of BENIGN PROSTATIC HYPERPLASIA  The various methods of treatment have been discussed with the patient and family. After consideration of risks, benefits and other options for treatment, the patient has consented to  Procedure(s): CYSTOSCOPY (N/A) [place] (TURP) (N/A) as a surgical intervention .  The patient's history has been reviewed, patient examined, no change in status, stable for surgery.  I have reviewed the patient's chart and labs.  Questions were answered to the patient's satisfaction.     Corrina Steffensen I Angelin Cutrone

## 2016-01-25 NOTE — Anesthesia Postprocedure Evaluation (Signed)
Anesthesia Post Note  Patient: [name]  Procedure(s) Performed: Procedure(s) (LRB): CYSTOSCOPY (N/A) TRANSURETHRAL RESECTION OF THE PROSTATE (TURP) (N/A)  Patient location during evaluation: PACU Anesthesia Type: General Level of consciousness: awake and alert Pain management: pain level controlled Vital Signs Assessment: post-procedure vital signs reviewed and stable Respiratory status: spontaneous breathing, nonlabored ventilation, respiratory function stable and patient connected to nasal cannula oxygen Cardiovascular status: blood pressure returned to baseline and stable Postop Assessment: no signs of nausea or vomiting Anesthetic complications: no    Last Vitals:  Filed Vitals:   01/25/16 1300 01/25/16 1315  BP: 117/67 114/69  Pulse: 89 62  Temp: 36.3 C   Resp: 19 16    Last Pain:  Filed Vitals:   01/25/16 1318  PainSc: 5                  Tirza Senteno L

## 2016-01-25 NOTE — Op Note (Signed)
Pre-operative diagnosis :   BPH  Postoperative diagnosis:  Same  Operation:  Cystoscopy, TURP  Surgeon:  S. Gaynelle Arabian, MD  First assistant:  None  Anesthesia:  General LMA  Preparation:  After appropriate preanesthesia, the patient was brought to the operating, placed on the operating table in the dorsal supine position where general LMA anesthesia was introduced. The armband was double checked. The history was allergic. He was replaced in the dorsal lithotomy position with pubis was prepped with Betadine solution and draped in usual fashion.  Review history:  Active Problems Problems  1. 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 Intermittent urinary stream (R39.13)  History of Present Illness 76 male returns today for a cystoscopy, prostate u/s and to review urodynamic results. Originally referred by Dr. Tollie Pizza for evaluation of bladder outlet symptoms, with international prostate symptom score of 22. He complains of urinary urgency, frequency, nocturia, and urinary incontinence. His symptoms are influenced by taking Lasix 20 mg by mouth per day. On physical examination he has a 4+ prostate. He has an implantable pacemaker defibrillator.    Urodynamics are preformed in the sitting position, with maximum cystometric capacity of 175 cc. The first sensation of filling occurred at 78 cc with normal desire at 158 cc strong desire at 159 cc. The bladder does have an unstable bladder contraction at that occurs at 141 cc, with an unstable bladder contraction pressure of 78 cm of water pressure. The patient voids off this contraction.    The patient did not leak for leak point pressure determination, and abdominal pressure generation of 101 7 L of water pressure.    Pressure flow study shows a voided volume of 137 cc with a maximum flow rate of 7 cc/s and a detrusor pressure at maximum flow of 64 cm of water, and a post void residual of 20 cc/s. The  flow pattern is obstructed.    VCUG shows trabeculation, but no reflux. Multiple diverticuli are noted. There were no bladder stones noted.    The patient has a maximum capacity which is small, of approximately 175 cc. He demonstrates instability, with multiple unstable contractions during the study. He voids off his unstable contraction. After refilling his bladder, he is able to generate a voluntary contraction, and voids with an obstructed flow pattern. He is able to empty somewhat efficiently, and leaves only 20 cc residual. He has trabeculation, and diverticula, noted on cystogram. There is no reflux.   Past Medical History  Statement of  Likelihood of Success: Excellent. TIME-OUT observed.:  Procedure:  Cystourethroscopy was accomplished, and the patient was noted to have large lateral prostatic lobes and elevated bladder neck. A 0 was identified, and maintained in position for the entire procedure. The bladder showed normal trigone, with clear reflux of both overseas. Trabeculation was noted, moderate in nature. There was no evidence of bladder stone tumor or diverticular formation. It was early cellule formation noted.  Resection was accomplished from the 7:00 to the 5:00 position, from the 10:00 to the 7:00 position, and from the 1:00 to the 5:00 positions. Chips were then evacuated from the bladder, and extensive cauterization of the prostatic bed was accomplished. A size 22 three-way Foley catheter was then placed, 2 traction and 3-way irrigation. The chips were sent to the laboratory for examination after a second tissue timeout was obtained.  The patient was awakened and taken to recovery room in good condition. A B and O suppository was placed at the beginning of  the procedure.

## 2016-01-25 NOTE — Progress Notes (Signed)
Patient received from PACU, alert and oriented, VS obtained, telemetry applied, CBI , f/c and SCDs intact, oriented to unit, call light placed in reach.

## 2016-01-25 NOTE — Anesthesia Procedure Notes (Signed)
Procedure Name: LMA Insertion Date/Time: 01/25/2016 11:33 AM Performed by: Freddie Breech Pre-anesthesia Checklist: Patient identified, Emergency Drugs available, Suction available and Timeout performed Patient Re-evaluated:Patient Re-evaluated prior to inductionOxygen Delivery Method: Circle system utilized Preoxygenation: Pre-oxygenation with 100% oxygen Intubation Type: IV induction LMA: LMA inserted LMA Size: 4.0 Number of attempts: 1 Placement Confirmation: positive ETCO2,  CO2 detector and breath sounds checked- equal and bilateral Tube secured with: Tape Dental Injury: Teeth and Oropharynx as per pre-operative assessment

## 2016-01-26 DIAGNOSIS — N401 Enlarged prostate with lower urinary tract symptoms: Secondary | ICD-10-CM | POA: Diagnosis not present

## 2016-01-26 LAB — BASIC METABOLIC PANEL
ANION GAP: 10 (ref 5–15)
BUN: 37 mg/dL — ABNORMAL HIGH (ref 6–20)
CALCIUM: 8.9 mg/dL (ref 8.9–10.3)
CO2: 22 mmol/L (ref 22–32)
CREATININE: 1.93 mg/dL — AB (ref 0.61–1.24)
Chloride: 107 mmol/L (ref 101–111)
GFR, EST AFRICAN AMERICAN: 39 mL/min — AB (ref >60)
GFR, EST NON AFRICAN AMERICAN: 34 mL/min — AB (ref >60)
Glucose, Bld: 126 mg/dL — ABNORMAL HIGH (ref 65–99)
Potassium: 4.9 mmol/L (ref 3.5–5.1)
Sodium: 139 mmol/L (ref 135–145)

## 2016-01-26 LAB — HEMOGLOBIN AND HEMATOCRIT, BLOOD
HEMATOCRIT: 42.7 % (ref 39.0–52.0)
Hemoglobin: 13.6 g/dL (ref 13.0–17.0)

## 2016-01-26 MED ORDER — CEPHALEXIN 500 MG PO CAPS: 500.0000 mg | ORAL_CAPSULE | Freq: Two times a day (BID) | ORAL | Status: DC

## 2016-01-26 MED ORDER — UROGESIC-BLUE 81.6 MG PO TABS: ORAL_TABLET | ORAL | Status: DC

## 2016-01-26 MED ORDER — OXYCODONE-ACETAMINOPHEN 5-325 MG PO TABS: 1.0000 | ORAL_TABLET | ORAL | Status: DC | PRN

## 2016-01-26 NOTE — Progress Notes (Signed)
Patient is stable for discharge. Discharge instructions and medications have been reviewed with the patient and his wife and all questions answered. Reviewed foley care and leg bag teaching with the patient and his wife. Also reviewed how to shower and clean the foley catheter tubing and how to apply neosporin per MD orders. Patient showered without difficulty prior to discharge.   Othella Boyer [facility] 01/26/2016

## 2016-01-26 NOTE — Discharge Summary (Signed)
Physician Discharge Summary  Patient ID: [name] MRN: [id number] DOB/AGE: 07/12/1946 70 y.o.  Admit date: 01/25/2016 Discharge date: 01/26/2016  Admission Diagnoses: BENIGN PROSTATIC HYPERPLASIA  Discharge Diagnoses:  Active Problems:   Benign prostatic hypertrophy   Discharged Condition: stable  Hospital Course:  TURP  Significant Diagnostic Studies:  Discharge Exam: Blood pressure 102/57, pulse 76, temperature 97.9 F (36.6 C), temperature source Oral, resp. rate 16, height 5\' 10"  (1.778 m), weight 109.6 kg (241 lb 10 oz), SpO2 97 %.   Disposition: 01-Home or Self Care  Discharge Instructions    Care order/instruction    Complete by:  As directed      Continue foley catheter    Complete by:  As directed      Continuous Bladder Irrigation    Complete by:  As directed   Discontinue this AM. Place neosporin on penis after shower at junction of the catheter and penis.     Discharge patient    Complete by:  As directed      Discharge patient    Complete by:  As directed      Discontinue IV    Complete by:  As directed             Medication List    STOP taking these medications        warfarin 5 MG tablet  Commonly known as:  COUMADIN      TAKE these medications        carvedilol 25 MG tablet  Commonly known as:  COREG  Take one-half tablet by  mouth twice a day with  meals     cephALEXin 500 MG capsule  Commonly known as:  KEFLEX  Take 1 capsule (500 mg total) by mouth 2 (two) times daily.     DIGOX 0.125 MG tablet  Generic drug:  digoxin  Take one-half tablet by  mouth daily     eplerenone 25 MG tablet  Commonly known as:  INSPRA  Take 1 tablet by mouth  daily     fenofibrate 160 MG tablet  Take 1 tablet by mouth  daily     folic acid [id number] MCG tablet  Commonly known as:  FOLVITE  Take 800 mcg by mouth daily.     furosemide 40 MG tablet  Commonly known as:  LASIX  Take one-half tablet by  mouth daily     LORazepam 1 MG tablet   Commonly known as:  ATIVAN  Take 1 mg by mouth every 8 (eight) hours as needed for anxiety.     losartan 25 MG tablet  Commonly known as:  COZAAR  Take 1 tablet (25 mg total) by mouth daily.     metoprolol succinate 25 MG 24 hr tablet  Commonly known as:  TOPROL-XL  TAKE 1 TABLET BY MOUTH DAILY. TAKE WITH OR IMMEDIATELY FOLLOWING A MEAL     multivitamin with minerals Tabs tablet  Take 1 tablet by mouth daily.     nitroGLYCERIN 0.4 MG SL tablet  Commonly known as:  NITROSTAT  Place 1 tablet (0.4 mg total) under the tongue every 5 (five) minutes as needed.     oxyCODONE-acetaminophen 5-325 MG tablet  Commonly known as:  ROXICET  Take 1 tablet by mouth every 4 (four) hours as needed for severe pain.     potassium chloride SA 20 MEQ tablet  Commonly known as:  K-DUR,KLOR-CON  Take one-half tablet by  mouth daily     pravastatin 40  MG tablet  Commonly known as:  PRAVACHOL  Take 1 tablet by mouth  daily     sertraline 100 MG tablet  Commonly known as:  ZOLOFT  Take 100 mg by mouth daily.     UROGESIC-BLUE 81.6 MG Tabs  Take 1 tablet 2-4 x/day as needed for urinary spasm or  burning     vitamin C 1000 MG tablet  Take 500 mg by mouth daily.           Follow-up Information    Follow up with Ailene Rud, MD.   Specialty:  Urology   Why:  call Kim at [phone] for Thursday appointment for voiding trial   Contact information:   [place] [address] [phone]      D/c today. Shower. Triple antibiotic ointment to penis. Catheter to straight drain. Teach how to empty bag.  Keflex 500mg  BID Hold Coumadin until cather removed RTC Thursday for voiding trial: call Kim at [phone] for time to RTC for voiding trial  Signed: Akshar Starnes I Phil Michels 01/26/2016, 8:20 AM

## 2016-01-26 NOTE — Discharge Instructions (Signed)
Benign Prostatic Hyperplasia An enlarged prostate (benign prostatic hyperplasia) is common in older men. You may experience the following:  Weak urine stream.  Dribbling.  Feeling like the bladder has not emptied completely.  Difficulty starting urination.  Getting up frequently at night to urinate.  Urinating more frequently during the day. HOME CARE INSTRUCTIONS  Monitor your prostatic hyperplasia for any changes. The following actions may help to alleviate any discomfort you are experiencing:  Give yourself time when you urinate.  Stay away from alcohol.  Avoid beverages containing caffeine, such as coffee, tea, and colas, because they can make the problem worse.  Avoid decongestants, antihistamines, and some prescription medicines that can make the problem worse.  Follow up with your health care provider for further treatment as recommended. SEEK MEDICAL CARE IF:  You are experiencing progressive difficulty voiding.  Your urine stream is progressively getting narrower.  You are awaking from sleep with the urge to void more frequently.  You are constantly feeling the need to void.  You experience loss of urine, especially in small amounts. SEEK IMMEDIATE MEDICAL CARE IF:   You develop increased pain with urination or are unable to urinate.  You develop severe abdominal pain, vomiting, a high fever, or fainting.  You develop back pain or blood in your urine. MAKE SURE YOU:   Understand these instructions.  Will watch your condition.  Will get help right away if you are not doing well or get worse.   This information is not intended to replace advice given to you by your health care provider. Make sure you discuss any questions you have with your health care provider.   Document Released: 12/05/2005 Document Revised: 12/26/2014 Document Reviewed: 05/07/2013 Elsevier Interactive Patient Education 2016 [place] transurethral resection of the prostate  (TURP) instructions  Your recent prostate surgery requires very special post hospital care. Despite the fact that no skin incisions were used the area around the prostate incision is quite raw and is covered with a scab to promote healing and prevent bleeding. Certain cautions are needed to assure that the scab is not disturbed of the next 2-3 weeks while the healing proceeds.  Because the raw surface in your prostate and the irritating effects of urine you may expect frequency of urination and/or urgency (a stronger desire to urinate) and perhaps even getting up at night more often. This will usually resolve or improve slowly over the healing period. You may see some blood in your urine over the first 6 weeks. Do not be alarmed, even if the urine was clear for a while. Get off your feet and drink lots of fluids until clearing occurs. If you start to pass clots or don't improve call us.  Diet:  You may return to your normal diet immediately. Because of the raw surface of your bladder, alcohol, spicy foods, foods high in acid and drinks with caffeine may cause irritation or frequency and should be used in moderation. To keep your urine flowing freely and avoid constipation, drink plenty of fluids during the day (8-10 glasses). Tip: Avoid cranberry juice because it is very acidic.  Activity:  Your physical activity doesn't need to be restricted. However, if you are very active, you may see some blood in the urine. We suggest that you reduce your activity under the circumstances until the bleeding has stopped.  Bowels:  It is important to keep your bowels regular during the postoperative period. Straining with bowel movements can cause bleeding. A bowel  movement every other day is reasonable. Use a mild laxative if needed, such as milk of magnesia 2-3 tablespoons, or 2 Dulcolax tablets. Call if you continue to have problems. If you had been taking narcotics for pain, before, during or after your  surgery, you may be constipated. Take a laxative if necessary.  Medication:  You should resume your pre-surgery medications unless told not to. In addition you may be given an antibiotic to prevent or treat infection. Antibiotics are not always necessary. All medication should be taken as prescribed until the bottles are finished unless you are having an unusual reaction to one of the drugs.     Problems you should report to [place]:  a. Fever greater than 101F. b. Heavy bleeding, or clots (see notes above about blood in urine). c. Inability to urinate. d. Drug reactions (hives, rash, nausea, vomiting, diarrhea). e. Severe burning or pain with urination that is not improving. [place]

## 2016-01-26 NOTE — Progress Notes (Signed)
Urology Progress Note  1 Day Post-Op   Subjective: post TURP. Up and walking.     O: + BM, Urine clear with CBI. No spsms. Eating well.     A: Stable today. Will allow d/c today. He will switch from septra to keflex in anticipation of re-start of Coumadin Thursday.      P: D/c today.      No acute urologic events overnight. Ambulation:   positive Flatus:    positive Bowel movement  positive  Pain: complete resolution  Objective:  Blood pressure 102/57, pulse 76, temperature 97.9 F (36.6 C), temperature source Oral, resp. rate 16, height 5\' 10"  (1.778 m), weight 109.6 kg (241 lb 10 oz), SpO2 97 %.  Physical Exam:  General:  No acute distress, awake Extremities: extremities normal, atraumatic, no cyanosis or edema Genitourinary:   normal Foley: clear. Off traction. CBI removed.     I/O last 3 completed shifts: In: 59 [P.O.:822; I.V.:1845; Other:5800] Out: 8750 [Urine:8750]  Recent Labs     01/26/16  0603  HGB  13.6    Recent Labs     01/26/16  0603  NA  139  K  4.9  CL  107  CO2  22  BUN  37*  CREATININE  1.93*  CALCIUM  8.9  GFRNONAA  34*  GFRAA  39*     Recent Labs     01/25/16  0845  INR  1.29  APTT  28     Invalid input(s): ABG  Assessment/Plan:  Wound care discussed. Ok to shower:  D/c.

## 2016-01-31 ENCOUNTER — Emergency Department (HOSPITAL_COMMUNITY): Payer: Medicare Other

## 2016-01-31 ENCOUNTER — Emergency Department (HOSPITAL_COMMUNITY)
Admission: EM | Admit: 2016-01-31 | Discharge: 2016-02-01 | Disposition: A | Discharge status: Home / Self Care | Payer: Medicare Other | Attending: Emergency Medicine | Admitting: Emergency Medicine

## 2016-01-31 ENCOUNTER — Encounter (HOSPITAL_COMMUNITY): Payer: Self-pay | Admitting: Emergency Medicine

## 2016-01-31 DIAGNOSIS — K5900 Constipation, unspecified: Secondary | ICD-10-CM | POA: Diagnosis not present

## 2016-01-31 DIAGNOSIS — E785 Hyperlipidemia, unspecified: Secondary | ICD-10-CM | POA: Insufficient documentation

## 2016-01-31 DIAGNOSIS — R140 Abdominal distension (gaseous): Secondary | ICD-10-CM

## 2016-01-31 DIAGNOSIS — I509 Heart failure, unspecified: Secondary | ICD-10-CM | POA: Insufficient documentation

## 2016-01-31 DIAGNOSIS — Z950 Presence of cardiac pacemaker: Secondary | ICD-10-CM | POA: Insufficient documentation

## 2016-01-31 DIAGNOSIS — Z79899 Other long term (current) drug therapy: Secondary | ICD-10-CM | POA: Insufficient documentation

## 2016-01-31 DIAGNOSIS — F329 Major depressive disorder, single episode, unspecified: Secondary | ICD-10-CM | POA: Insufficient documentation

## 2016-01-31 DIAGNOSIS — Z9889 Other specified postprocedural states: Secondary | ICD-10-CM | POA: Insufficient documentation

## 2016-01-31 DIAGNOSIS — Z87891 Personal history of nicotine dependence: Secondary | ICD-10-CM | POA: Insufficient documentation

## 2016-01-31 DIAGNOSIS — I2510 Atherosclerotic heart disease of native coronary artery without angina pectoris: Secondary | ICD-10-CM | POA: Insufficient documentation

## 2016-01-31 DIAGNOSIS — R0600 Dyspnea, unspecified: Secondary | ICD-10-CM | POA: Insufficient documentation

## 2016-01-31 DIAGNOSIS — I252 Old myocardial infarction: Secondary | ICD-10-CM | POA: Diagnosis not present

## 2016-01-31 DIAGNOSIS — Z9861 Coronary angioplasty status: Secondary | ICD-10-CM | POA: Diagnosis not present

## 2016-01-31 DIAGNOSIS — R079 Chest pain, unspecified: Secondary | ICD-10-CM | POA: Diagnosis present

## 2016-01-31 DIAGNOSIS — Z87448 Personal history of other diseases of urinary system: Secondary | ICD-10-CM | POA: Insufficient documentation

## 2016-01-31 DIAGNOSIS — I481 Persistent atrial fibrillation: Secondary | ICD-10-CM | POA: Diagnosis not present

## 2016-01-31 DIAGNOSIS — N529 Male erectile dysfunction, unspecified: Secondary | ICD-10-CM | POA: Insufficient documentation

## 2016-01-31 LAB — BASIC METABOLIC PANEL
Anion gap: 12 (ref 5–15)
BUN: 31 mg/dL — AB (ref 6–20)
CALCIUM: 10.9 mg/dL — AB (ref 8.9–10.3)
CO2: 24 mmol/L (ref 22–32)
CREATININE: 2.12 mg/dL — AB (ref 0.61–1.24)
Chloride: 103 mmol/L (ref 101–111)
GFR calc Af Amer: 35 mL/min — ABNORMAL LOW (ref >60)
GFR calc non Af Amer: 30 mL/min — ABNORMAL LOW (ref >60)
GLUCOSE: 153 mg/dL — AB (ref 65–99)
Potassium: 4.2 mmol/L (ref 3.5–5.1)
Sodium: 139 mmol/L (ref 135–145)

## 2016-01-31 LAB — CBC
HCT: 40.1 % (ref 39.0–52.0)
HEMOGLOBIN: 13.1 g/dL (ref 13.0–17.0)
MCH: 26.5 pg (ref 26.0–34.0)
MCHC: 32.7 g/dL (ref 30.0–36.0)
MCV: 81 fL (ref 78.0–100.0)
PLATELETS: 236 10*3/uL (ref 150–400)
RBC: 4.95 MIL/uL (ref 4.22–5.81)
RDW: 14.6 % (ref 11.5–15.5)
WBC: 9.8 10*3/uL (ref 4.0–10.5)

## 2016-01-31 LAB — PROTIME-INR
INR: 1.4 (ref 0.00–1.49)
Prothrombin Time: 17.2 seconds — ABNORMAL HIGH (ref 11.6–15.2)

## 2016-01-31 LAB — I-STAT TROPONIN, ED: TROPONIN I, POC: 0.03 ng/mL (ref 0.00–0.08)

## 2016-01-31 NOTE — ED Notes (Addendum)
Pt c/o left sided chest pain, was in hospital Monday for prostate resection, went home Tuesday, had catheter taken out Thursday-- has had "fullness in abd " since Thursday. Pt spoke with physician's office, told to take mg citrate-- took with relief.   Was off Coumadin for 5 days prior to surgery and restarted on Thursday-- has been feeling short of breath.

## 2016-01-31 NOTE — ED Provider Notes (Addendum)
CSN: [id number]     Arrival date & time 01/31/16  1903 History   First MD Initiated Contact with Patient 01/31/16 2118     Chief Complaint  Patient presents with  . Chest Pain      Patient is a 70 y.o. male presenting with chest pain. The history is provided by the patient.  Chest Pain  patient presents with slight chest pain and shortness of breath. Earlier this week he had a prostate resection. Since then he has felt fatigued. States he has felt a little bloated in his abdomen. No fevers or chills. No dysuria. States he was given some magnesium citrate for constipation. No unilateral leg swelling but states his legs will do some chronic swelling. He has been on Coumadin for atrial fibrillation but was off it for the surgery. Surgery back on around 4 days ago. Was not given a Lovenox bridge.  Past Medical History  Diagnosis Date  . Coronary artery disease   . MI (myocardial infarction) ([place]) 2000    ANTERIOR, s/p PCI  . Ischemic cardiomyopathy     EF 23%  . Persistent atrial fibrillation ([facility])     on coumadin  . Chronic renal insufficiency   . Dyslipidemia   . Depression   . ED (erectile dysfunction)   . Other primary cardiomyopathies   . Presence of permanent cardiac pacemaker   . AICD (automatic cardioverter/defibrillator) present   . Dysrhythmia     atrial fibrillation  . CHF (congestive heart failure) ([place])   . Shortness of breath dyspnea     on exertion   Past Surgical History  Procedure Laterality Date  . Cardiac catheterization  02/05/2008    MODERATE TO SEVERE LEFT VENTRICULAR  DYSFUNCTION WITH EF 25-30%  . Coronary stent placement      LAD  . Transthoracic echocardiogram  12/2010    EF 30-35%  . Cardiac defibrillator placement  2009    MDT BI-VENTRICULAR ICD by Dr Caryl Comes  . Wrist fracture surgery    . Breast lumpectomy    . Implantable cardioverter defibrillator generator change  2014  . Biv icd genertaor change out N/A 09/16/2013    Procedure: BIV ICD GENERTAOR  CHANGE OUT;  Surgeon: [name], MD;  Location[facility] C[facility] LAB;  Service: Cardiovascular;  Laterality: N/A;  . Insert / replace / remove pacemaker      ICD  . Coronary angioplasty  2000  . Cystoscopy N/A 01/25/2016    Procedure: CYSTOSCOPY;  Surgeon: Carolan Clines, MD;  Location: [facility] ORS;  Service: Urology;  Laterality: N/A;  . Transurethral resection of prostate N/A 01/25/2016    Procedure: TRANSURETHRAL RESECTION OF THE PROSTATE (TURP);  Surgeon: Carolan Clines, MD;  Location: [facility] ORS;  Service: Urology;  Laterality: N/A;   Family History  Problem Relation Age of Onset  . Breast cancer Mother   . Depression Mother   . Heart attack Father   . Breast cancer Sister    Social History  Substance Use Topics  . Smoking status: Former Smoker    Types: Cigarettes    Quit date: 12/23/1998  . Smokeless tobacco: None  . Alcohol Use: No    Review of Systems  Cardiovascular: Positive for chest pain.      Allergies  Keflex  Home Medications   Prior to Admission medications   Medication Sig Start Date End Date Taking? Authorizing Provider  Ascorbic Acid (VITAMIN C) 1000 MG tablet Take 500 mg by mouth daily.   Yes Historical  Provider, MD  carvedilol (COREG) 25 MG tablet Take one-half tablet by  mouth twice a day with  meals 05/25/15  Yes Peter M Martinique, MD  [facility] 125 MCG tablet Take one-half tablet by  mouth daily 07/14/15  Yes [name], MD  eplerenone (INSPRA) 25 MG tablet Take 1 tablet by mouth  daily Patient taking differently: Take 1 tablet by mouth  daily at 12 noon 10/13/15  Yes Peter M Martinique, MD  fenofibrate 160 MG tablet Take 1 tablet by mouth  daily 11/16/15  Yes Peter M Martinique, MD  folic acid (FOLVITE) Q000111Q MCG tablet Take 800 mcg by mouth daily.    Yes Historical Provider, MD  furosemide (LASIX) 40 MG tablet Take one-half tablet by  mouth daily 01/07/15  Yes Peter M Martinique, MD  LORazepam (ATIVAN) 1 MG tablet Take 1 mg by mouth every 8 (eight) hours as needed for  anxiety.    Yes Historical Provider, MD  losartan (COZAAR) 25 MG tablet Take 1 tablet (25 mg total) by mouth daily. 05/25/15  Yes Peter M Martinique, MD  Methen-Hyosc-Meth Blue-Na Phos (UROGESIC-BLUE) 81.6 MG TABS Take 1 tablet 2-4 x/day as needed for urinary spasm or  burning 01/26/16  Yes Carolan Clines, MD  metoprolol succinate (TOPROL-XL) 25 MG 24 hr tablet TAKE 1 TABLET BY MOUTH DAILY. TAKE WITH OR IMMEDIATELY FOLLOWING A MEAL 07/10/15  Yes [name], MD  Multiple Vitamin (MULTIVITAMIN WITH MINERALS) TABS tablet Take 1 tablet by mouth daily.   Yes Historical Provider, MD  nitroGLYCERIN (NITROSTAT) 0.4 MG SL tablet Place 1 tablet (0.4 mg total) under the tongue every 5 (five) minutes as needed. 10/28/15  Yes Peter M Martinique, MD  potassium chloride SA (K-DUR,KLOR-CON) 20 MEQ tablet Take one-half tablet by  mouth daily 07/14/15  Yes Peter M Martinique, MD  pravastatin (PRAVACHOL) 40 MG tablet Take 1 tablet by mouth  daily Patient taking differently: Take 1 tablet by mouth  daily in evening 12/15/15  Yes Peter M Martinique, MD  sertraline (ZOLOFT) 100 MG tablet Take 100 mg by mouth daily.     Yes Historical Provider, MD  cephALEXin (KEFLEX) 500 MG capsule Take 1 capsule (500 mg total) by mouth 2 (two) times daily. Patient not taking: Reported on 01/31/2016 01/26/16   Carolan Clines, MD  oxyCODONE-acetaminophen (ROXICET) 5-325 MG tablet Take 1 tablet by mouth every 4 (four) hours as needed for severe pain. Patient not taking: Reported on 01/31/2016 01/26/16   Carolan Clines, MD   BP 132/71 mmHg  Pulse 83  Temp(Src) 99.4 F (37.4 C) (Oral)  Resp 18  Ht 5\' 11"  (1.803 m)  Wt 230 lb (104.327 kg)  BMI 32.09 kg/m2  SpO2 95% Physical Exam  Constitutional: He appears well-developed.  HENT:  Head: Atraumatic.  Neck: Neck supple.  Cardiovascular: Normal rate.   Pulmonary/Chest: Effort normal. He has no wheezes.  Abdominal: Soft.  Fair mild diffuse tenderness without rebound or guarding.   Musculoskeletal: He exhibits no edema.  Neurological: He is alert.  Skin: Skin is warm.    ED Course  Procedures (including critical care time) Labs Review Labs Reviewed  BASIC METABOLIC PANEL - Abnormal; Notable for the following:    Glucose, Bld 153 (*)    BUN 31 (*)    Creatinine, Ser 2.12 (*)    Calcium 10.9 (*)    GFR calc non Af Amer 30 (*)    GFR calc Af Amer 35 (*)    All other components within  normal limits  PROTIME-INR - Abnormal; Notable for the following:    Prothrombin Time 17.2 (*)    All other components within normal limits  CBC  I-STAT TROPOININ, ED    Imaging Review Dg Chest 2 View  01/31/2016  CLINICAL DATA:  70 year old male with shortness of breath EXAM: CHEST  2 VIEW COMPARISON:  Radiograph dated 01/19/2016 FINDINGS: Two views of the chest do not demonstrate a focal consolidation. There is no pleural effusion or pneumothorax. The cardiac silhouette is within normal limits. Right pectoral AICD device. The osseous structures appear unremarkable. IMPRESSION: No active cardiopulmonary disease. Electronically Signed   By: Anner Crete M.D.   On: 01/31/2016 21:16   Nm Pulmonary Perf And Vent  02/01/2016  CLINICAL DATA:  Shortness of breath and left-sided chest pain. EXAM: NUCLEAR MEDICINE VENTILATION - PERFUSION LUNG SCAN TECHNIQUE: Ventilation images were obtained in multiple projections using inhaled aerosol Tc-13m DTPA. Perfusion images were obtained in multiple projections after intravenous injection of Tc-74m MAA. RADIOPHARMACEUTICALS:  [id number] millicuries [id number] DTPA aerosol inhalation and [id number] millicuries [id number] MAA IV COMPARISON:  Chest radiograph 01/31/2016 FINDINGS: Ventilation: Focal defect in the right upper lung corresponding to cardiac pacemaker. Otherwise, no significant ventilation defect is identified. Perfusion: No wedge shaped peripheral perfusion defects to suggest acute pulmonary embolism. IMPRESSION: Very low probability of  pulmonary embolus. Electronically Signed   By: Lucienne Capers M.D.   On: 02/01/2016 01:18   Dg Abd 2 Views  01/31/2016  CLINICAL DATA:  70 year old male with abdominal pain and bloating constipation. EXAM: ABDOMEN - 2 VIEW COMPARISON:  Chest radiograph dated 01/31/2016 FINDINGS: Constipation. There is no evidence of bowel obstruction or free air. No radiopaque calculi noted. There is degenerative changes of the spine. No acute fracture. AICD leads partially visualized. IMPRESSION: Constipation.  No bowel obstruction or free air. Electronically Signed   By: Anner Crete M.D.   On: 01/31/2016 23:20   I have personally reviewed and evaluated these images and lab results as part of my medical decision-making.   EKG Interpretation   Date/Time:  Sunday January 31 2016 19:10:19 EST Ventricular Rate:  102 PR Interval:    QRS Duration: 150 QT Interval:  388 QTC Calculation: 505 R Axis:   -80 Text Interpretation:  Ventricular-paced rhythm Abnormal ECG Confirmed by  Alvino Chapel  MD, Ovid Curd [phone]) on 01/31/2016 9:20:03 PM      MDM   Final diagnoses:  Dyspnea  Bloating  Constipation, unspecified constipation type    Patient with shortness of breath fatigue and abdominal pain with constipation. Has some worsening renal insufficiency. VQ scan do to recent surgery. No pulmonary embolism. X-ray showed some constipation. Will treat with mineral lax as needed. Discharge home to follow-up    Davonna Belling, MD 02/01/16 [place], MD 03/17/16 [phone]

## 2016-01-31 NOTE — ED Notes (Signed)
Pt states he was having some nausea, distension, reflux called his urologist who ordered mag citrate. States he took it and had a better BM but did not have relief.

## 2016-01-31 NOTE — ED Notes (Signed)
MD at bedside. 

## 2016-02-01 MED ORDER — TECHNETIUM TO 99M ALBUMIN AGGREGATED
4.0500 | Freq: Once | INTRAVENOUS | Status: AC | PRN
  Administered 2016-02-01: 4 via INTRAVENOUS

## 2016-02-01 MED ORDER — TECHNETIUM TC 99M DIETHYLENETRIAME-PENTAACETIC ACID: 31.7000 | Freq: Once | INTRAVENOUS | Status: DC | PRN

## 2016-02-01 NOTE — Discharge Instructions (Signed)

## 2016-02-05 DIAGNOSIS — K219 Gastro-esophageal reflux disease without esophagitis: Secondary | ICD-10-CM | POA: Insufficient documentation

## 2016-02-05 DIAGNOSIS — Z860100 Personal history of colon polyps, unspecified: Secondary | ICD-10-CM | POA: Insufficient documentation

## 2016-02-05 DIAGNOSIS — I429 Cardiomyopathy, unspecified: Secondary | ICD-10-CM | POA: Insufficient documentation

## 2016-02-07 ENCOUNTER — Other Ambulatory Visit: Payer: Self-pay | Admitting: Cardiology

## 2016-02-08 NOTE — Telephone Encounter (Signed)
Rx(s) sent to pharmacy electronically.  

## 2016-02-18 ENCOUNTER — Emergency Department (HOSPITAL_COMMUNITY): Payer: Medicare Other

## 2016-02-18 ENCOUNTER — Inpatient Hospital Stay (HOSPITAL_COMMUNITY)
Admission: EM | Admit: 2016-02-18 | Discharge: 2016-03-02 | DRG: 329 | Disposition: A | Discharge status: Skilled Nursing Facility | Payer: Medicare Other | Attending: Internal Medicine | Admitting: Internal Medicine

## 2016-02-18 ENCOUNTER — Inpatient Hospital Stay (HOSPITAL_COMMUNITY): Payer: Medicare Other

## 2016-02-18 ENCOUNTER — Encounter (HOSPITAL_COMMUNITY): Payer: Self-pay

## 2016-02-18 DIAGNOSIS — I130 Hypertensive heart and chronic kidney disease with heart failure and stage 1 through stage 4 chronic kidney disease, or unspecified chronic kidney disease: Secondary | ICD-10-CM | POA: Diagnosis present

## 2016-02-18 DIAGNOSIS — D500 Iron deficiency anemia secondary to blood loss (chronic): Secondary | ICD-10-CM | POA: Diagnosis present

## 2016-02-18 DIAGNOSIS — Z87891 Personal history of nicotine dependence: Secondary | ICD-10-CM | POA: Diagnosis not present

## 2016-02-18 DIAGNOSIS — R1010 Upper abdominal pain, unspecified: Secondary | ICD-10-CM | POA: Diagnosis not present

## 2016-02-18 DIAGNOSIS — A0472 Enterocolitis due to Clostridium difficile, not specified as recurrent: Secondary | ICD-10-CM | POA: Diagnosis present

## 2016-02-18 DIAGNOSIS — T368X5A Adverse effect of other systemic antibiotics, initial encounter: Secondary | ICD-10-CM | POA: Diagnosis present

## 2016-02-18 DIAGNOSIS — T45515A Adverse effect of anticoagulants, initial encounter: Secondary | ICD-10-CM | POA: Diagnosis present

## 2016-02-18 DIAGNOSIS — N529 Male erectile dysfunction, unspecified: Secondary | ICD-10-CM | POA: Diagnosis present

## 2016-02-18 DIAGNOSIS — F329 Major depressive disorder, single episode, unspecified: Secondary | ICD-10-CM | POA: Diagnosis present

## 2016-02-18 DIAGNOSIS — N183 Chronic kidney disease, stage 3 unspecified: Secondary | ICD-10-CM | POA: Insufficient documentation

## 2016-02-18 DIAGNOSIS — R739 Hyperglycemia, unspecified: Secondary | ICD-10-CM | POA: Diagnosis present

## 2016-02-18 DIAGNOSIS — I9589 Other hypotension: Secondary | ICD-10-CM | POA: Diagnosis not present

## 2016-02-18 DIAGNOSIS — Z79899 Other long term (current) drug therapy: Secondary | ICD-10-CM | POA: Diagnosis not present

## 2016-02-18 DIAGNOSIS — G9341 Metabolic encephalopathy: Secondary | ICD-10-CM | POA: Diagnosis not present

## 2016-02-18 DIAGNOSIS — K275 Chronic or unspecified peptic ulcer, site unspecified, with perforation: Secondary | ICD-10-CM | POA: Diagnosis not present

## 2016-02-18 DIAGNOSIS — F419 Anxiety disorder, unspecified: Secondary | ICD-10-CM | POA: Diagnosis present

## 2016-02-18 DIAGNOSIS — I482 Chronic atrial fibrillation, unspecified: Secondary | ICD-10-CM | POA: Diagnosis present

## 2016-02-18 DIAGNOSIS — E876 Hypokalemia: Secondary | ICD-10-CM | POA: Diagnosis present

## 2016-02-18 DIAGNOSIS — N400 Enlarged prostate without lower urinary tract symptoms: Secondary | ICD-10-CM | POA: Diagnosis present

## 2016-02-18 DIAGNOSIS — R339 Retention of urine, unspecified: Secondary | ICD-10-CM | POA: Diagnosis present

## 2016-02-18 DIAGNOSIS — R0602 Shortness of breath: Secondary | ICD-10-CM

## 2016-02-18 DIAGNOSIS — D649 Anemia, unspecified: Secondary | ICD-10-CM | POA: Diagnosis present

## 2016-02-18 DIAGNOSIS — Z95828 Presence of other vascular implants and grafts: Secondary | ICD-10-CM

## 2016-02-18 DIAGNOSIS — I959 Hypotension, unspecified: Secondary | ICD-10-CM | POA: Diagnosis present

## 2016-02-18 DIAGNOSIS — I2510 Atherosclerotic heart disease of native coronary artery without angina pectoris: Secondary | ICD-10-CM

## 2016-02-18 DIAGNOSIS — Z7901 Long term (current) use of anticoagulants: Secondary | ICD-10-CM | POA: Diagnosis not present

## 2016-02-18 DIAGNOSIS — I4891 Unspecified atrial fibrillation: Secondary | ICD-10-CM | POA: Diagnosis present

## 2016-02-18 DIAGNOSIS — Z955 Presence of coronary angioplasty implant and graft: Secondary | ICD-10-CM

## 2016-02-18 DIAGNOSIS — Z9889 Other specified postprocedural states: Secondary | ICD-10-CM

## 2016-02-18 DIAGNOSIS — E785 Hyperlipidemia, unspecified: Secondary | ICD-10-CM | POA: Diagnosis present

## 2016-02-18 DIAGNOSIS — I82402 Acute embolism and thrombosis of unspecified deep veins of left lower extremity: Secondary | ICD-10-CM | POA: Diagnosis not present

## 2016-02-18 DIAGNOSIS — R791 Abnormal coagulation profile: Secondary | ICD-10-CM | POA: Diagnosis present

## 2016-02-18 DIAGNOSIS — Z9079 Acquired absence of other genital organ(s): Secondary | ICD-10-CM | POA: Diagnosis not present

## 2016-02-18 DIAGNOSIS — I82439 Acute embolism and thrombosis of unspecified popliteal vein: Secondary | ICD-10-CM | POA: Diagnosis not present

## 2016-02-18 DIAGNOSIS — R338 Other retention of urine: Secondary | ICD-10-CM | POA: Diagnosis present

## 2016-02-18 DIAGNOSIS — K265 Chronic or unspecified duodenal ulcer with perforation: Secondary | ICD-10-CM | POA: Diagnosis not present

## 2016-02-18 DIAGNOSIS — I252 Old myocardial infarction: Secondary | ICD-10-CM

## 2016-02-18 DIAGNOSIS — I5022 Chronic systolic (congestive) heart failure: Secondary | ICD-10-CM | POA: Diagnosis present

## 2016-02-18 DIAGNOSIS — Z9861 Coronary angioplasty status: Secondary | ICD-10-CM

## 2016-02-18 DIAGNOSIS — A047 Enterocolitis due to Clostridium difficile: Secondary | ICD-10-CM | POA: Diagnosis not present

## 2016-02-18 DIAGNOSIS — Z95810 Presence of automatic (implantable) cardiac defibrillator: Secondary | ICD-10-CM | POA: Diagnosis not present

## 2016-02-18 DIAGNOSIS — R1013 Epigastric pain: Secondary | ICD-10-CM | POA: Diagnosis not present

## 2016-02-18 DIAGNOSIS — R1084 Generalized abdominal pain: Secondary | ICD-10-CM

## 2016-02-18 DIAGNOSIS — K922 Gastrointestinal hemorrhage, unspecified: Secondary | ICD-10-CM | POA: Diagnosis present

## 2016-02-18 DIAGNOSIS — Z881 Allergy status to other antibiotic agents status: Secondary | ICD-10-CM | POA: Diagnosis not present

## 2016-02-18 DIAGNOSIS — Z818 Family history of other mental and behavioral disorders: Secondary | ICD-10-CM

## 2016-02-18 DIAGNOSIS — E860 Dehydration: Secondary | ICD-10-CM | POA: Diagnosis present

## 2016-02-18 DIAGNOSIS — J9601 Acute respiratory failure with hypoxia: Secondary | ICD-10-CM | POA: Diagnosis not present

## 2016-02-18 DIAGNOSIS — K659 Peritonitis, unspecified: Secondary | ICD-10-CM | POA: Diagnosis present

## 2016-02-18 DIAGNOSIS — M79606 Pain in leg, unspecified: Secondary | ICD-10-CM | POA: Diagnosis not present

## 2016-02-18 DIAGNOSIS — I255 Ischemic cardiomyopathy: Secondary | ICD-10-CM | POA: Diagnosis present

## 2016-02-18 DIAGNOSIS — K269 Duodenal ulcer, unspecified as acute or chronic, without hemorrhage or perforation: Secondary | ICD-10-CM

## 2016-02-18 DIAGNOSIS — D62 Acute posthemorrhagic anemia: Secondary | ICD-10-CM | POA: Diagnosis present

## 2016-02-18 DIAGNOSIS — R109 Unspecified abdominal pain: Secondary | ICD-10-CM | POA: Diagnosis present

## 2016-02-18 DIAGNOSIS — Z452 Encounter for adjustment and management of vascular access device: Secondary | ICD-10-CM

## 2016-02-18 DIAGNOSIS — K266 Chronic or unspecified duodenal ulcer with both hemorrhage and perforation: Secondary | ICD-10-CM | POA: Diagnosis present

## 2016-02-18 LAB — COMPREHENSIVE METABOLIC PANEL
ALT: 10 U/L — AB (ref 17–63)
AST: 16 U/L (ref 15–41)
Albumin: 3 g/dL — ABNORMAL LOW (ref 3.5–5.0)
Alkaline Phosphatase: 23 U/L — ABNORMAL LOW (ref 38–126)
Anion gap: 9 (ref 5–15)
BUN: 58 mg/dL — ABNORMAL HIGH (ref 6–20)
CHLORIDE: 112 mmol/L — AB (ref 101–111)
CO2: 23 mmol/L (ref 22–32)
Calcium: 8.7 mg/dL — ABNORMAL LOW (ref 8.9–10.3)
Creatinine, Ser: 1.71 mg/dL — ABNORMAL HIGH (ref 0.61–1.24)
GFR, EST AFRICAN AMERICAN: 46 mL/min — AB (ref >60)
GFR, EST NON AFRICAN AMERICAN: 39 mL/min — AB (ref >60)
Glucose, Bld: 109 mg/dL — ABNORMAL HIGH (ref 65–99)
POTASSIUM: 4.7 mmol/L (ref 3.5–5.1)
SODIUM: 144 mmol/L (ref 135–145)
TOTAL PROTEIN: 5.7 g/dL — AB (ref 6.5–8.1)
Total Bilirubin: 0.4 mg/dL (ref 0.3–1.2)

## 2016-02-18 LAB — URINALYSIS, ROUTINE W REFLEX MICROSCOPIC
BILIRUBIN URINE: NEGATIVE
Glucose, UA: NEGATIVE mg/dL
Ketones, ur: NEGATIVE mg/dL
NITRITE: NEGATIVE
PROTEIN: NEGATIVE mg/dL
SPECIFIC GRAVITY, URINE: 1.015 (ref 1.005–1.030)
pH: 5.5 (ref 5.0–8.0)

## 2016-02-18 LAB — RETICULOCYTES
RBC.: 3.42 MIL/uL — AB (ref 4.22–5.81)
Retic Count, Absolute: 54.7 10*3/uL (ref 19.0–186.0)
Retic Ct Pct: 1.6 % (ref 0.4–3.1)

## 2016-02-18 LAB — CBC WITH DIFFERENTIAL/PLATELET
BASOS ABS: 0 10*3/uL (ref 0.0–0.1)
Basophils Relative: 0 %
EOS ABS: 0.2 10*3/uL (ref 0.0–0.7)
EOS PCT: 3 %
HCT: 29 % — ABNORMAL LOW (ref 39.0–52.0)
Hemoglobin: 9.8 g/dL — ABNORMAL LOW (ref 13.0–17.0)
LYMPHS ABS: 1.9 10*3/uL (ref 0.7–4.0)
Lymphocytes Relative: 20 %
MCH: 27.2 pg (ref 26.0–34.0)
MCHC: 33.8 g/dL (ref 30.0–36.0)
MCV: 80.6 fL (ref 78.0–100.0)
MONO ABS: 0.6 10*3/uL (ref 0.1–1.0)
Monocytes Relative: 6 %
Neutro Abs: 6.9 10*3/uL (ref 1.7–7.7)
Neutrophils Relative %: 71 %
PLATELETS: 251 10*3/uL (ref 150–400)
RBC: 3.6 MIL/uL — AB (ref 4.22–5.81)
RDW: 14.5 % (ref 11.5–15.5)
WBC: 9.6 10*3/uL (ref 4.0–10.5)

## 2016-02-18 LAB — PROTIME-INR
INR: 8.68 — AB (ref 0.00–1.49)
INR: 3.28 — ABNORMAL HIGH (ref 0.00–1.49)
PROTHROMBIN TIME: 68.1 s — AB (ref 11.6–15.2)
Prothrombin Time: 32.7 seconds — ABNORMAL HIGH (ref 11.6–15.2)

## 2016-02-18 LAB — LIPASE, BLOOD: LIPASE: 54 U/L — AB (ref 11–51)

## 2016-02-18 LAB — URINE MICROSCOPIC-ADD ON

## 2016-02-18 LAB — IRON AND TIBC
IRON: 100 ug/dL (ref 45–182)
SATURATION RATIOS: 26 % (ref 17.9–39.5)
TIBC: 385 ug/dL (ref 250–450)
UIBC: 285 ug/dL

## 2016-02-18 LAB — TYPE AND SCREEN
ABO/RH(D): B POS
ANTIBODY SCREEN: NEGATIVE

## 2016-02-18 LAB — I-STAT CG4 LACTIC ACID, ED: Lactic Acid, Venous: 0.83 mmol/L (ref 0.5–2.0)

## 2016-02-18 LAB — BRAIN NATRIURETIC PEPTIDE: B Natriuretic Peptide: 156.7 pg/mL — ABNORMAL HIGH (ref 0.0–100.0)

## 2016-02-18 LAB — FOLATE: FOLATE: 17.2 ng/mL (ref >5.9)

## 2016-02-18 LAB — POC OCCULT BLOOD, ED: Fecal Occult Bld: POSITIVE — AB

## 2016-02-18 LAB — I-STAT TROPONIN, ED: Troponin i, poc: 0.03 ng/mL (ref 0.00–0.08)

## 2016-02-18 LAB — FERRITIN: Ferritin: 184 ng/mL (ref 24–336)

## 2016-02-18 LAB — TSH: TSH: 3.082 u[IU]/mL (ref 0.350–4.500)

## 2016-02-18 LAB — DIGOXIN LEVEL: DIGOXIN LVL: 0.3 ng/mL — AB (ref 0.8–2.0)

## 2016-02-18 LAB — VITAMIN B12: Vitamin B-12: 248 pg/mL (ref 180–914)

## 2016-02-18 LAB — ABO/RH: ABO/RH(D): B POS

## 2016-02-18 MED ORDER — BOOST / RESOURCE BREEZE PO LIQD
1.0000 | Freq: Three times a day (TID) | ORAL | Status: DC
  Administered 2016-02-19: 1 via ORAL
  Administered 2016-02-19: 0.9875 mL via ORAL
  Administered 2016-02-19 – 2016-02-21 (×6): 1 via ORAL

## 2016-02-18 MED ORDER — VITAMIN K1 10 MG/ML IJ SOLN
10.0000 mg | Freq: Once | INTRAVENOUS | Status: AC
  Administered 2016-02-18: 10 mg via INTRAVENOUS
  Filled 2016-02-18: qty 1

## 2016-02-18 MED ORDER — ACETAMINOPHEN 325 MG PO TABS
650.0000 mg | ORAL_TABLET | Freq: Four times a day (QID) | ORAL | Status: DC | PRN
  Administered 2016-02-19: 650 mg via ORAL
  Filled 2016-02-18: qty 2

## 2016-02-18 MED ORDER — DILTIAZEM HCL 25 MG/5ML IV SOLN
5.0000 mg | Freq: Once | INTRAVENOUS | Status: DC | PRN
  Filled 2016-02-18: qty 5

## 2016-02-18 MED ORDER — VITAMIN C 500 MG PO TABS
500.0000 mg | ORAL_TABLET | Freq: Every day | ORAL | Status: DC
  Administered 2016-02-19 – 2016-02-22 (×4): 500 mg via ORAL
  Filled 2016-02-18 (×4): qty 1

## 2016-02-18 MED ORDER — PRAVASTATIN SODIUM 40 MG PO TABS
40.0000 mg | ORAL_TABLET | Freq: Every day | ORAL | Status: DC
  Administered 2016-02-18 – 2016-02-21 (×4): 40 mg via ORAL
  Filled 2016-02-18 (×4): qty 1

## 2016-02-18 MED ORDER — SODIUM CHLORIDE 0.9 % IV SOLN
INTRAVENOUS | Status: DC
  Administered 2016-02-18 (×2): via INTRAVENOUS

## 2016-02-18 MED ORDER — ACETAMINOPHEN 650 MG RE SUPP: 650.0000 mg | Freq: Four times a day (QID) | RECTAL | Status: DC | PRN

## 2016-02-18 MED ORDER — DIGOXIN 125 MCG PO TABS
0.1250 mg | ORAL_TABLET | Freq: Every day | ORAL | Status: DC
  Administered 2016-02-18 – 2016-02-22 (×5): 0.125 mg via ORAL
  Filled 2016-02-18 (×5): qty 1

## 2016-02-18 MED ORDER — ONDANSETRON HCL 4 MG/2ML IJ SOLN
4.0000 mg | Freq: Four times a day (QID) | INTRAMUSCULAR | Status: DC | PRN
  Administered 2016-02-19 – 2016-03-01 (×4): 4 mg via INTRAVENOUS
  Filled 2016-02-18 (×4): qty 2

## 2016-02-18 MED ORDER — SERTRALINE HCL 100 MG PO TABS
100.0000 mg | ORAL_TABLET | Freq: Every day | ORAL | Status: DC
  Administered 2016-02-18 – 2016-02-22 (×5): 100 mg via ORAL
  Filled 2016-02-18: qty 2
  Filled 2016-02-18 (×4): qty 1

## 2016-02-18 MED ORDER — SODIUM CHLORIDE 0.9 % IV BOLUS (SEPSIS)
500.0000 mL | Freq: Once | INTRAVENOUS | Status: AC
  Administered 2016-02-18: 500 mL via INTRAVENOUS

## 2016-02-18 MED ORDER — ONDANSETRON HCL 4 MG PO TABS
4.0000 mg | ORAL_TABLET | Freq: Four times a day (QID) | ORAL | Status: DC | PRN
  Administered 2016-02-20 – 2016-02-21 (×2): 4 mg via ORAL
  Filled 2016-02-18 (×3): qty 1

## 2016-02-18 MED ORDER — RISAQUAD PO CAPS
2.0000 | ORAL_CAPSULE | Freq: Every day | ORAL | Status: DC
  Administered 2016-02-19 – 2016-02-22 (×3): 2 via ORAL
  Filled 2016-02-18 (×3): qty 2

## 2016-02-18 MED ORDER — FOLIC ACID 1 MG PO TABS
1000.0000 ug | ORAL_TABLET | Freq: Every day | ORAL | Status: DC
  Administered 2016-02-19 – 2016-02-22 (×4): 1 mg via ORAL
  Filled 2016-02-18 (×4): qty 1

## 2016-02-18 MED ORDER — POLYETHYLENE GLYCOL 3350 17 G PO PACK
17.0000 g | PACK | Freq: Every day | ORAL | Status: DC
  Filled 2016-02-18: qty 1

## 2016-02-18 MED ORDER — SODIUM CHLORIDE 0.9% FLUSH
3.0000 mL | Freq: Two times a day (BID) | INTRAVENOUS | Status: DC
  Administered 2016-02-18 – 2016-02-22 (×5): 3 mL via INTRAVENOUS

## 2016-02-18 MED ORDER — LORAZEPAM 1 MG PO TABS
1.0000 mg | ORAL_TABLET | Freq: Three times a day (TID) | ORAL | Status: DC | PRN
  Administered 2016-02-19 – 2016-02-22 (×4): 1 mg via ORAL
  Filled 2016-02-18 (×4): qty 1

## 2016-02-18 MED ORDER — POTASSIUM CHLORIDE CRYS ER 20 MEQ PO TBCR
20.0000 meq | EXTENDED_RELEASE_TABLET | Freq: Every day | ORAL | Status: DC
  Administered 2016-02-18 – 2016-02-22 (×5): 20 meq via ORAL
  Filled 2016-02-18 (×5): qty 1

## 2016-02-18 NOTE — H&P (Signed)
[facility] History and Physical  [name] [id number] DOB: [birth year] DOA: 02/18/2016  Referring physician: Dr Phylis Bougie PCP: [name], MD   Chief Complaint: Hypotension and dizziness  HPI: [name] is a 70 y.o. male   Patient presenting today for complaints of hypotension and dizziness. Patient states that he's had various somatic complaints ever since he had a TURP procedure performed on 01/25/2016. Of note he complains of intermittent abdominal cramping, bloating, constipation. Over the last 24 hours he is experienced extreme fatigue, dizziness upon standing and shortness of breath or dyspnea on exertion. Patient has been taking ciprofloxacin ever since his surgery. Patient also states that his blood pressure medications were changed 2 weeks ago due to hypotension. His dose of carvedilol was decreased and his losartan was stopped. Patient is also had some mild nausea without vomiting but denies chest pain, palpitations, neck stiffness, headache, LOC, head trauma. Patient has taken BP at home and states systolic as low as [id number].   Review of Systems:  Constitutional:  No weight loss, night sweats, Fevers, chills, fatigue.  HEENT:  No headaches, Difficulty swallowing,Tooth/dental problems,Sore throat, Cardio-vascular:  No chest pain, Orthopnea, PND, swelling in lower extremities, anasarca, dizziness, palpitations  GI: Per HPI Resp:   No excess mucus, no productive cough, No non-productive cough, No coughing up of blood.No change in color of mucus.No wheezing.No chest wall deformity  Skin:  no rash or lesions.  GU:  no dysuria, change in color of urine, no urgency or frequency. No flank pain.  Musculoskeletal:   No joint pain or swelling. No decreased range of motion. No back pain.  Psych:  No change in mood or affect. No depression or anxiety. No memory loss.  Neuro:  No change in sensation, unilateral strength, or cognitive abilities  All other systems  were reviewed and are negative.  Past Medical History  Diagnosis Date  . Coronary artery disease   . MI (myocardial infarction) ([place]) 2000    ANTERIOR, s/p PCI  . Ischemic cardiomyopathy     EF 23%  . Persistent atrial fibrillation (HCC)     on coumadin  . Chronic renal insufficiency   . Dyslipidemia   . Depression   . ED (erectile dysfunction)   . Other primary cardiomyopathies   . Presence of permanent cardiac pacemaker   . AICD (automatic cardioverter/defibrillator) present   . Dysrhythmia     atrial fibrillation  . CHF (congestive heart failure) ([place])   . Shortness of breath dyspnea     on exertion   Past Surgical History  Procedure Laterality Date  . Cardiac catheterization  02/05/2008    MODERATE TO SEVERE LEFT VENTRICULAR  DYSFUNCTION WITH EF 25-30%  . Coronary stent placement      LAD  . Transthoracic echocardiogram  12/2010    EF 30-35%  . Cardiac defibrillator placement  2009    MDT BI-VENTRICULAR ICD by Dr Caryl Comes  . Wrist fracture surgery    . Breast lumpectomy    . Implantable cardioverter defibrillator generator change  2014  . Biv icd genertaor change out N/A 09/16/2013    Procedure: BIV ICD GENERTAOR CHANGE OUT;  Surgeon: [name], MD;  Location: [facility] CATH LAB;  Service: Cardiovascular;  Laterality: N/A;  . Insert / replace / remove pacemaker      ICD  . Coronary angioplasty  2000  . Cystoscopy N/A 01/25/2016    Procedure: CYSTOSCOPY;  Surgeon: Carolan Clines, MD;  Location: [facility] ORS;  Service: Urology;  Laterality: N/A;  . Transurethral resection of prostate N/A 01/25/2016    Procedure: TRANSURETHRAL RESECTION OF THE PROSTATE (TURP);  Surgeon: Carolan Clines, MD;  Location: [facility] ORS;  Service: Urology;  Laterality: N/A;   Social History:  reports that he quit smoking about 17 years ago. His smoking use included Cigarettes. He does not have any smokeless tobacco history on file. He reports that he does not drink alcohol or use illicit drugs.  Allergies    Allergen Reactions  . Keflex [Cephalexin] Shortness Of Breath and Other (See Comments)    dizziness    Family History  Problem Relation Age of Onset  . Breast cancer Mother   . Depression Mother   . Heart attack Father   . Breast cancer Sister      Prior to Admission medications   Medication Sig Start Date End Date Taking? Authorizing Provider  Ascorbic Acid (VITAMIN C) 1000 MG tablet Take 500 mg by mouth daily.   Yes Historical Provider, MD  carvedilol (COREG) 25 MG tablet Take one-half tablet by  mouth twice a day with  meals Patient taking differently: Take 6.25 mg by mouth daily.  05/25/15  Yes Peter M Martinique, MD  ciprofloxacin (CIPRO) 500 MG tablet Take 500 mg by mouth 2 (two) times daily. Started on 02-11-16 patients on day 13.   Yes Historical Provider, MD  Door 125 MCG tablet Take one-half tablet by  mouth daily 07/14/15  Yes [name], MD  eplerenone (INSPRA) 25 MG tablet Take 1 tablet by mouth  daily Patient taking differently: Take 1 tablet by mouth  daily at 12 noon 10/13/15  Yes Peter M Martinique, MD  fenofibrate 160 MG tablet Take 1 tablet by mouth  daily 11/16/15  Yes Peter M Martinique, MD  furosemide (LASIX) 40 MG tablet Take 0.5 tablets (20 mg total) by mouth daily. 02/08/16  Yes Peter M Martinique, MD  losartan (COZAAR) 25 MG tablet Take 1 tablet (25 mg total) by mouth daily. 05/25/15  Yes Peter M Martinique, MD  Methen-Hyosc-Meth Blue-Na Phos (UROGESIC-BLUE) 81.6 MG TABS Take 1 tablet 2-4 x/day as needed for urinary spasm or  burning 01/26/16  Yes Carolan Clines, MD  metoprolol succinate (TOPROL-XL) 25 MG 24 hr tablet TAKE 1 TABLET BY MOUTH DAILY. TAKE WITH OR IMMEDIATELY FOLLOWING A MEAL 07/10/15  Yes [name], MD  metoprolol succinate (TOPROL-XL) 25 MG 24 hr tablet Take 12.5 mg by mouth daily.   Yes Historical Provider, MD  Multiple Vitamin (MULTIVITAMIN WITH MINERALS) TABS tablet Take 1 tablet by mouth daily.   Yes Historical Provider, MD  nitroGLYCERIN (NITROSTAT) 0.4 MG  SL tablet Place 1 tablet (0.4 mg total) under the tongue every 5 (five) minutes as needed. 10/28/15  Yes Peter M Martinique, MD  potassium chloride SA (K-DUR,KLOR-CON) 20 MEQ tablet Take one-half tablet by  mouth daily 07/14/15  Yes Peter M Martinique, MD  pravastatin (PRAVACHOL) 40 MG tablet Take 1 tablet by mouth  daily Patient taking differently: Take 1 tablet by mouth  daily in evening 12/15/15  Yes Peter M Martinique, MD  sertraline (ZOLOFT) 100 MG tablet Take 100 mg by mouth daily.     Yes Historical Provider, MD  cephALEXin (KEFLEX) 500 MG capsule Take 1 capsule (500 mg total) by mouth 2 (two) times daily. Patient not taking: Reported on 01/31/2016 01/26/16   Carolan Clines, MD  folic acid (FOLVITE) Q000111Q MCG tablet Take 800 mcg by mouth daily.     Historical Provider, MD  LORazepam (ATIVAN) 1 MG tablet Take 1 mg by mouth every 8 (eight) hours as needed for anxiety.     Historical Provider, MD  oxyCODONE-acetaminophen (ROXICET) 5-325 MG tablet Take 1 tablet by mouth every 4 (four) hours as needed for severe pain. Patient not taking: Reported on 01/31/2016 01/26/16   Carolan Clines, MD   Physical Exam: Filed Vitals:   02/18/16 1145 02/18/16 1230 02/18/16 1245 02/18/16 1315  BP: 105/65 116/68 111/65 110/68  Pulse: 106 105 99 97  Resp: 15 16 23 19   SpO2: 99% 99% 99% 99%    Wt Readings from Last 3 Encounters:  01/31/16 104.327 kg (230 lb)  01/25/16 109.6 kg (241 lb 10 oz)  01/19/16 107.502 kg (237 lb)    General:  Appears calm and comfortable Eyes:  PERRL, EOMI, normal lids, iris ENT:  grossly normal hearing, lips & tongue Neck:  no LAD, masses or thyromegaly Cardiovascular:  RRR, no m/r/g. No LE edema.  Respiratory:  CTA bilaterally, no w/r/r. Normal respiratory effort. Abdomen:  soft, ntnd Skin:  no rash or induration seen on limited exam Musculoskeletal:  grossly normal tone BUE/BLE Psychiatric:  grossly normal mood and affect, speech fluent and appropriate Neurologic:  CN 2-12 grossly  intact, moves all extremities in coordinated fashion.          Labs on Admission:  Basic Metabolic Panel:  Recent Labs Lab 02/18/16 0940  NA 144  K 4.7  CL 112*  CO2 23  GLUCOSE 109*  BUN 58*  CREATININE 1.71*  CALCIUM 8.7*   Liver Function Tests:  Recent Labs Lab 02/18/16 0940  AST 16  ALT 10*  ALKPHOS 23*  BILITOT 0.4  PROT 5.7*  ALBUMIN 3.0*    Recent Labs Lab 02/18/16 0940  LIPASE 54*   No results for input(s): AMMONIA in the last 168 hours. CBC:  Recent Labs Lab 02/18/16 0940  WBC 9.6  NEUTROABS 6.9  HGB 9.8*  HCT 29.0*  MCV 80.6  PLT 251   Cardiac Enzymes: No results for input(s): CKTOTAL, CKMB, CKMBINDEX, TROPONINI in the last 168 hours.  BNP (last 3 results)  Recent Labs  02/18/16 0940  BNP 156.7*    ProBNP (last 3 results) No results for input(s): PROBNP in the last 8760 hours.   Creatinine clearance cannot be calculated (Unknown ideal weight.)  CBG: No results for input(s): GLUCAP in the last 168 hours.  Radiological Exams on Admission: Dg Chest 2 View  02/18/2016  CLINICAL DATA:  Dizziness. EXAM: CHEST  2 VIEW COMPARISON:  January 31, 2016. FINDINGS: The heart size and mediastinal contours are within normal limits. Right-sided pacemaker is unchanged in position. No pneumothorax or pleural effusion is noted. Both lungs are clear. The visualized skeletal structures are unremarkable. IMPRESSION: No active cardiopulmonary disease. Electronically Signed   By: Marijo Conception, M.D.   On: 02/18/2016 10:05    EKG: Independently reviewed. Afib, paced, no ACS   Assessment/Plan Active Problems:   Chronic systolic CHF (congestive heart failure) (HCC)   GI bleed   Hypotension   Symptomatic anemia   Chronic a-fib (HCC)   S/P TURP   CKD (chronic kidney disease) stage 3, GFR 30-59 ml/min  GI bleed: FOBT positive with malonic stools. Hgb 9.8 with baseline 14. Symptomatic hypotension. INR 8.68 (stopped home Vit K and on cipro x2 wks).  Last colonoscopy approximate 4 years ago which showed few colonic polyps. No previous GI bleed. Vit K given in ED.  - Step down - Trend  INR - consider more Vit K if remains supratherapeutic - GI if continues to have bleed - consider NM RBC study to locate bleed if needed  Hypotension: suspect volume depletion while on antihypertensives. PT states baseline BP ~110 while on multiple medications.  - hold metop, coreg (Patient takes 2 bblockers - confirmed), , lasix - IVF   Symptomatic Anemia: Hgb 9.8 with baseline 14. Secondary to problem #1 - Transfuse if hemoglobin drops below 8 - Anemia panel, if low iron recommend IV iron infusion  Abdominal discomfort: Patient on ciprofloxacin for approximately the last 4 weeks status post TURP. These were not continued by urology but by PCP. C. difficile is a possibility as well as discomfort caused by GI bleed but cannot rule out other etiology. Patient typically with daily soft bowel movements. - C. difficile, enteric precautions - KUB - Probiotic - Miralax - treatment for GI bleed as above  Afib/systolic CHF: s/p AICD placement. Systolic CHF 123456. No evidence of fluid overload or RVR. CHA2DS2-VASc = 4  - Hold metop, coreg, Inspra, Lasix due to profound hypotension - continue Digoxin - IV dilt if rate >120  Abnormal UA: UA showing many bacteria, WBCs too numerous to count, 0-5 squamous cells. Patient has been on ciprofloxacin since surgery. Patient asymptomatic. Patient 4 weeks status post TURP. Urine culture sent. Discussed with urology on call Dr. Noah Delaine who agrees with culture that no antibiotics unless patient becomes symptomatic - Follow-up urine culture - Start antibiotic if develops symptoms  CKD: Cr 1.7 ~baseline - Mild IVF - BMET in am  ANxiety: - continue Ativan   Code Status: FULL  DVT Prophylaxis: SCD Family Communication: wife Disposition Plan: Pending Improvement    Nicholis Stepanek Lenna Sciara, MD Family Medicine [facility] [email or web address] Password [email or web address]

## 2016-02-18 NOTE — ED Notes (Signed)
Accepting nurse to call back for report 

## 2016-02-18 NOTE — ED Provider Notes (Addendum)
CSN: [id number]     Arrival date & time 02/18/16  [id number] History   First MD Initiated Contact with Patient 02/18/16 0831     Chief Complaint  Patient presents with  . Hypotension  . Dizziness     (Consider location/radiation/quality/duration/timing/severity/associated sxs/prior Treatment) HPI Comments: Patient is a 70 year old male with a history coronary artery disease, MI, ischemic cardiomyopathy with persistent atrial fibrillation on Coumadin status post pacemaker and defibrillator placement, chronic renal insufficiency and CHF who underwent a urethral surgery at the beginning of February presenting today with worsening symptoms. He states work every since the surgery has been slow with intermittent GI complaints, bloating and constipation but in the last 24 hours he has noted extreme fatigue, dizziness upon standing and intermittent shortness of breath. He denies any palpitations or chest pain. He has been taking Cipro for over 2 weeks and he is not sure why. Also a proximally 2 weeks ago his blood pressure medication was changed. He decreased his carvedilol and stop taking his losartan. He states he's just not feeling any better. He had an episode of nausea last night but denies any vomiting. He has noticed dark stools in the last 2 days. He has been taking Mylanta and Maalox for GI upset and bloating. He has also been taking an aspirin for some low back pain. He has never had GI bleeding before but also takes Coumadin for his A. fib. He has a blood pressure cuff at home and when he took his blood pressure today it was Q000111Q systolic and that's what prompted him to come to the emergency room. Currently he is only complaining of feeling generally fatigued. He has not taken any medications this morning.  Patient is a 70 y.o. male presenting with dizziness. The history is provided by the patient.  Dizziness Quality:  Lightheadedness Severity:  Moderate Onset quality:  Sudden Duration:  1 day Timing:   Constant Progression:  Unchanged Chronicity:  New Context: standing up   Relieved by:  Lying down Exacerbated by: Activity. Associated symptoms: nausea, shortness of breath and weakness   Associated symptoms: no chest pain, no diarrhea, no palpitations and no vomiting   Associated symptoms comment:  Black stool that last 2 days Risk factors: heart disease, multiple medications and new medications     Past Medical History  Diagnosis Date  . Coronary artery disease   . MI (myocardial infarction) ([place]) 2000    ANTERIOR, s/p PCI  . Ischemic cardiomyopathy     EF 23%  . Persistent atrial fibrillation (H[facility])     on coumadin  . Chronic renal insufficiency   . Dyslipidemia   . Depression   . ED (erectile dysfunction)   . Other primary cardiomyopathies   . Presence of permanent cardiac pacemaker   . AICD (automatic cardioverter/defibrillator) present   . Dysrhythmia     atrial fibrillation  . CHF (congestive heart failure) ([place])   . Shortness of breath dyspnea     on exertion   Past Surgical History  Procedure Laterality Date  . Cardiac catheterization  02/05/2008    MODERATE TO SEVERE LEFT VENTRICULAR  DYSFUNCTION WITH EF 25-30%  . Coronary stent placement      LAD  . Transthoracic echocardiogram  12/2010    EF 30-35%  . Cardiac defibrillator placement  2009    MDT BI-VENTRICULAR ICD by Dr Caryl Comes  . Wrist fracture surgery    . Breast lumpectomy    . Implantable cardioverter defibrillator generator change  2014  .  Biv icd genertaor change out N/A 09/16/2013    Procedure: BIV ICD GENERTAOR CHANGE OUT;  Surgeon: [name], MD;  Location: [facility] CATH LAB;  Service: Cardiovascular;  Laterality: N/A;  . Insert / replace / remove pacemaker      ICD  . Coronary angioplasty  2000  . Cystoscopy N/A 01/25/2016    Procedure: CYSTOSCOPY;  Surgeon: Carolan Clines, MD;  Location: [facility] ORS;  Service: Urology;  Laterality: N/A;  . Transurethral resection of prostate N/A 01/25/2016     Procedure: TRANSURETHRAL RESECTION OF THE PROSTATE (TURP);  Surgeon: Carolan Clines, MD;  Location: [facility] ORS;  Service: Urology;  Laterality: N/A;   Family History  Problem Relation Age of Onset  . Breast cancer Mother   . Depression Mother   . Heart attack Father   . Breast cancer Sister    Social History  Substance Use Topics  . Smoking status: Former Smoker    Types: Cigarettes    Quit date: 12/23/1998  . Smokeless tobacco: None  . Alcohol Use: No    Review of Systems  Respiratory: Positive for shortness of breath.   Cardiovascular: Negative for chest pain and palpitations.  Gastrointestinal: Positive for nausea. Negative for vomiting and diarrhea.  Neurological: Positive for dizziness and weakness.  All other systems reviewed and are negative.     Allergies  Keflex  Home Medications   Prior to Admission medications   Medication Sig Start Date End Date Taking? Authorizing Provider  Ascorbic Acid (VITAMIN C) 1000 MG tablet Take 500 mg by mouth daily.    Historical Provider, MD  carvedilol (COREG) 25 MG tablet Take one-half tablet by  mouth twice a day with  meals 05/25/15   Peter M Martinique, MD  cephALEXin (KEFLEX) 500 MG capsule Take 1 capsule (500 mg total) by mouth 2 (two) times daily. Patient not taking: Reported on 01/31/2016 01/26/16   Carolan Clines, MD  [place] 125 MCG tablet Take one-half tablet by  mouth daily 07/14/15   [name], MD  eplerenone (INSPRA) 25 MG tablet Take 1 tablet by mouth  daily Patient taking differently: Take 1 tablet by mouth  daily at 12 noon 10/13/15   Peter M Martinique, MD  fenofibrate 160 MG tablet Take 1 tablet by mouth  daily 11/16/15   Peter M Martinique, MD  folic acid (FOLVITE) Q000111Q MCG tablet Take 800 mcg by mouth daily.     Historical Provider, MD  furosemide (LASIX) 40 MG tablet Take 0.5 tablets (20 mg total) by mouth daily. 02/08/16   Peter M Martinique, MD  LORazepam (ATIVAN) 1 MG tablet Take 1 mg by mouth every 8 (eight) hours as  needed for anxiety.     Historical Provider, MD  losartan (COZAAR) 25 MG tablet Take 1 tablet (25 mg total) by mouth daily. 05/25/15   Peter M Martinique, MD  Methen-Hyosc-Meth Blue-Na Phos (UROGESIC-BLUE) 81.6 MG TABS Take 1 tablet 2-4 x/day as needed for urinary spasm or  burning 01/26/16   Carolan Clines, MD  metoprolol succinate (TOPROL-XL) 25 MG 24 hr tablet TAKE 1 TABLET BY MOUTH DAILY. TAKE WITH OR IMMEDIATELY FOLLOWING A MEAL 07/10/15   [name], MD  Multiple Vitamin (MULTIVITAMIN WITH MINERALS) TABS tablet Take 1 tablet by mouth daily.    Historical Provider, MD  nitroGLYCERIN (NITROSTAT) 0.4 MG SL tablet Place 1 tablet (0.4 mg total) under the tongue every 5 (five) minutes as needed. 10/28/15   Peter M Martinique, MD  oxyCODONE-acetaminophen (ROXICET) [phone]  MG tablet Take 1 tablet by mouth every 4 (four) hours as needed for severe pain. Patient not taking: Reported on 01/31/2016 01/26/16   Carolan Clines, MD  potassium chloride SA (K-DUR,KLOR-CON) 20 MEQ tablet Take one-half tablet by  mouth daily 07/14/15   Peter M Martinique, MD  pravastatin (PRAVACHOL) 40 MG tablet Take 1 tablet by mouth  daily Patient taking differently: Take 1 tablet by mouth  daily in evening 12/15/15   Peter M Martinique, MD  sertraline (ZOLOFT) 100 MG tablet Take 100 mg by mouth daily.      Historical Provider, MD   BP 102/60 mmHg  Pulse 100  Resp 14  SpO2 100% Physical Exam  Constitutional: He is oriented to person, place, and time. He appears well-developed and well-nourished. No distress.  HENT:  Head: Normocephalic and atraumatic.  Mouth/Throat: Oropharynx is clear and moist. Mucous membranes are dry.  Tongue is pale and dry  Eyes: Conjunctivae and EOM are normal. Pupils are equal, round, and reactive to light.  Neck: Normal range of motion. Neck supple.  Cardiovascular: Intact distal pulses.  An irregularly irregular rhythm present. Tachycardia present.   No murmur heard. Pulmonary/Chest: Effort normal and  breath sounds normal. No respiratory distress. He has no wheezes. He has no rales.  Abdominal: Soft. He exhibits no distension. There is no rebound and no guarding.  Mild slight tenderness throughout the abdomen but no localized pain  Musculoskeletal: Normal range of motion. He exhibits edema. He exhibits no tenderness.  Trace edema bilaterally in the ankles. No calf pain or swelling  Neurological: He is alert and oriented to person, place, and time.  Skin: Skin is warm and dry. No rash noted. No erythema.  Psychiatric: He has a normal mood and affect. His behavior is normal.  Nursing note and vitals reviewed.   ED Course  Procedures (including critical care time) Labs Review Labs Reviewed  CBC WITH DIFFERENTIAL/PLATELET - Abnormal; Notable for the following:    RBC 3.60 (*)    Hemoglobin 9.8 (*)    HCT 29.0 (*)    All other components within normal limits  COMPREHENSIVE METABOLIC PANEL - Abnormal; Notable for the following:    Chloride 112 (*)    Glucose, Bld 109 (*)    BUN 58 (*)    Creatinine, Ser 1.71 (*)    Calcium 8.7 (*)    Total Protein 5.7 (*)    Albumin 3.0 (*)    ALT 10 (*)    Alkaline Phosphatase 23 (*)    GFR calc non Af Amer 39 (*)    GFR calc Af Amer 46 (*)    All other components within normal limits  URINALYSIS, ROUTINE W REFLEX MICROSCOPIC (NOT AT [facility]) - Abnormal; Notable for the following:    APPearance CLOUDY (*)    Hgb urine dipstick LARGE (*)    Leukocytes, UA LARGE (*)    All other components within normal limits  LIPASE, BLOOD - Abnormal; Notable for the following:    Lipase 54 (*)    All other components within normal limits  BRAIN NATRIURETIC PEPTIDE - Abnormal; Notable for the following:    B Natriuretic Peptide 156.7 (*)    All other components within normal limits  PROTIME-INR - Abnormal; Notable for the following:    Prothrombin Time 68.1 (*)    INR 8.68 (*)    All other components within normal limits  URINE MICROSCOPIC-ADD ON -  Abnormal; Notable for the following:    Squamous  Epithelial / LPF 0-5 (*)    Bacteria, UA MANY (*)    Casts HYALINE CASTS (*)    All other components within normal limits  POC OCCULT BLOOD, ED - Abnormal; Notable for the following:    Fecal Occult Bld POSITIVE (*)    All other components within normal limits  URINE CULTURE  TSH  I-STAT TROPOININ, ED  I-STAT CG4 LACTIC ACID, ED  TYPE AND SCREEN  ABO/RH    Imaging Review Dg Chest 2 View  02/18/2016  CLINICAL DATA:  Dizziness. EXAM: CHEST  2 VIEW COMPARISON:  January 31, 2016. FINDINGS: The heart size and mediastinal contours are within normal limits. Right-sided pacemaker is unchanged in position. No pneumothorax or pleural effusion is noted. Both lungs are clear. The visualized skeletal structures are unremarkable. IMPRESSION: No active cardiopulmonary disease. Electronically Signed   By: Marijo Conception, M.D.   On: 02/18/2016 10:05   I have personally reviewed and evaluated these images and lab results as part of my medical decision-making.   EKG Interpretation   Date/Time:  Thursday February 18 2016 08:30:23 EST Ventricular Rate:  114 PR Interval:    QRS Duration: 148 QT Interval:  410 QTC Calculation: 565 R Axis:   -108 Text Interpretation:  venticular paced rhythm Atrial fibrillation  Nonspecific IVCD with LAD Inferior infarct, old No significant change  since last tracing Confirmed by [facility]  MD, Loree Fee ([id number]) on 02/18/2016  9:16:10 AM      MDM   Final diagnoses:  Gastrointestinal hemorrhage, unspecified gastritis, unspecified gastrointestinal hemorrhage type  Supratherapeutic INR  Anemia, unspecified anemia type    Patient is a 71 year old male with worsening fatigue, shortness of breath since having surgery at the beginning of February. Patient states yesterday he suddenly developed extreme exhaustion, shortness of breath with exertion but denies any chest pain. He states since the surgery he has had some GI issues  where his stomach feels upset with intermittent constipation however in the last 2 days he's noticed dark stool. He is taking Mylanta. He denies any urinary issues since his surgery. He has been taking Cipro for the last 2 weeks but he is not sure why. Also when he saw his PCP he recently stopped taking his losartan and decreased his carvedilol. This morning he started to feel dizzy which she also felt yesterday for the first time and we checked his blood pressure at home it was 75/59. Here patient's blood pressure has been in the low 100s with a heart rate between 95 and 115. He is in atrial fibrillation which is chronic. He denies any abdominal pain but did have an episode of nausea last night. Currently he denies shortness of breath but is still complaining of feeling extremely tired.  Patient has no obvious signs of fluid overload. He denies any fever at home. No abdominal pain.  Given patient's extensive heart history is possible that this is CHF versus atypical ACS versus orthostatic hypotension from dehydration versus GI bleed versus thyroid disease.  CBC, CMP, UA, lipase, TSH, BMP, INR, troponin, lactic acid pending. Patient had orthostatics done and upon standing his heart rate went to 130 and his blood pressure dropped to 72/39. As soon as patient is confirmed to not have CHF he will be given IV fluids. He has not taken any of his medications this morning.  10:14 AM Patient's Hemoccult card is positive for GI bleeding. CBC with a 3 g drop in hemoglobin from 2 weeks ago from 13-9.8.  Patient's  INR is also 8.68 today which is most likely a result of him taking Cipro and not adjusting his Coumadin. Creatinine is stable at 1.7. Patient's urine does show too numerous to count white blood cells and a large leukocytes and culture was done. Patient given IV vitamin K. We'll discuss with medicine for admission  CRITICAL CARE Performed by: Blanchie Dessert Total critical care time: 30 minutes Critical  care time was exclusive of separately billable procedures and treating other patients. Critical care was necessary to treat or prevent imminent or life-threatening deterioration. Critical care was time spent personally by me on the following activities: development of treatment plan with patient and/or surrogate as well as nursing, discussions with consultants, evaluation of patient's response to treatment, examination of patient, obtaining history from patient or surrogate, ordering and performing treatments and interventions, ordering and review of laboratory studies, ordering and review of radiographic studies, pulse oximetry and re-evaluation of patient's condition.   Blanchie Dessert, MD 02/18/16 [place], MD 02/18/16 1151

## 2016-02-18 NOTE — ED Notes (Signed)
Pt. Presents with complaint of dizziness/hypotension beginning this AM. Pt. States BP was 76/59 at home. Pt. Has been seen multiple times recently for weakness and not feeling well.

## 2016-02-18 NOTE — ED Notes (Signed)
Amy, RN accepts report at this time.

## 2016-02-19 LAB — CBC
HEMATOCRIT: 26.3 % — AB (ref 39.0–52.0)
HEMOGLOBIN: 8.2 g/dL — AB (ref 13.0–17.0)
MCH: 25.5 pg — ABNORMAL LOW (ref 26.0–34.0)
MCHC: 31.2 g/dL (ref 30.0–36.0)
MCV: 81.9 fL (ref 78.0–100.0)
Platelets: 230 10*3/uL (ref 150–400)
RBC: 3.21 MIL/uL — ABNORMAL LOW (ref 4.22–5.81)
RDW: 14.6 % (ref 11.5–15.5)
WBC: 8.2 10*3/uL (ref 4.0–10.5)

## 2016-02-19 LAB — PROTIME-INR
INR: 1.48 (ref 0.00–1.49)
INR: 1.54 — ABNORMAL HIGH (ref 0.00–1.49)
INR: 1.68 — AB (ref 0.00–1.49)
PROTHROMBIN TIME: 19.8 s — AB (ref 11.6–15.2)
PROTHROMBIN TIME: 18.5 s — AB (ref 11.6–15.2)
Prothrombin Time: 18 seconds — ABNORMAL HIGH (ref 11.6–15.2)

## 2016-02-19 LAB — BASIC METABOLIC PANEL
Anion gap: 10 (ref 5–15)
BUN: 47 mg/dL — AB (ref 6–20)
CHLORIDE: 115 mmol/L — AB (ref 101–111)
CO2: 21 mmol/L — ABNORMAL LOW (ref 22–32)
CREATININE: 1.41 mg/dL — AB (ref 0.61–1.24)
Calcium: 8.6 mg/dL — ABNORMAL LOW (ref 8.9–10.3)
GFR calc Af Amer: 58 mL/min — ABNORMAL LOW (ref >60)
GFR calc non Af Amer: 50 mL/min — ABNORMAL LOW (ref >60)
Glucose, Bld: 98 mg/dL (ref 65–99)
Potassium: 4.3 mmol/L (ref 3.5–5.1)
Sodium: 146 mmol/L — ABNORMAL HIGH (ref 135–145)

## 2016-02-19 LAB — URINE CULTURE: CULTURE: NO GROWTH

## 2016-02-19 LAB — C DIFFICILE QUICK SCREEN W PCR REFLEX
C DIFFICLE (CDIFF) ANTIGEN: POSITIVE — AB
C Diff interpretation: POSITIVE
C Diff toxin: POSITIVE — AB

## 2016-02-19 LAB — MRSA PCR SCREENING: MRSA by PCR: NEGATIVE

## 2016-02-19 MED ORDER — PROMETHAZINE HCL 25 MG/ML IJ SOLN
12.5000 mg | Freq: Four times a day (QID) | INTRAMUSCULAR | Status: DC | PRN
  Administered 2016-02-20 – 2016-02-22 (×5): 12.5 mg via INTRAVENOUS
  Filled 2016-02-19 (×6): qty 1

## 2016-02-19 MED ORDER — VANCOMYCIN 50 MG/ML ORAL SOLUTION
125.0000 mg | Freq: Four times a day (QID) | ORAL | Status: DC
  Administered 2016-02-19 – 2016-02-24 (×22): 125 mg via ORAL
  Filled 2016-02-19 (×6): qty 2.5
  Filled 2016-02-19: qty 3
  Filled 2016-02-19 (×2): qty 2.5
  Filled 2016-02-19: qty 3
  Filled 2016-02-19 (×2): qty 2.5
  Filled 2016-02-19: qty 3
  Filled 2016-02-19 (×4): qty 2.5
  Filled 2016-02-19: qty 3
  Filled 2016-02-19 (×7): qty 2.5
  Filled 2016-02-19: qty 3
  Filled 2016-02-19 (×8): qty 2.5

## 2016-02-19 MED ORDER — SODIUM CHLORIDE 0.45 % IV SOLN
INTRAVENOUS | Status: DC
  Administered 2016-02-19 – 2016-02-21 (×2): via INTRAVENOUS

## 2016-02-19 MED ORDER — CARVEDILOL 6.25 MG PO TABS
6.2500 mg | ORAL_TABLET | Freq: Two times a day (BID) | ORAL | Status: DC
  Administered 2016-02-19 – 2016-02-21 (×5): 6.25 mg via ORAL
  Filled 2016-02-19 (×6): qty 1

## 2016-02-19 NOTE — Progress Notes (Signed)
[facility] TEAM 1 - Stepdown/ICU TEAM PROGRESS NOTE  SEARS HAAG [id number] DOB: 06-Jan-1946 DOA: 02/18/2016 PCP: [name], MD  Admit HPI / Brief Narrative: 70 y.o. male who presented w/ complaints of hypotension and dizziness. Patient stated he had various somatic complaints ever since he had a TURP on 01/25/2016, to include intermittent abdominal cramping, bloating, constipation. Over the 24 hours prior to his admit he experienced extreme fatigue, dizziness upon standing, and shortness of breath on exertion. Patient had been taking ciprofloxacin since his surgery. Patient stated his blood pressure medications were changed 2 weeks prior due to hypotension. Patient took his BP at home and stated systolic was in the Q000111Q.   HPI/Subjective: Pt states he continues to feel severely nauseated.  He denies cp, vomiting, or sob.  He c/o diffuse abdom discomfort.    Assessment/Plan:  GI bleed - FOBT positive / melena  colonoscopy approximate 4 years ago which showed few colonic polyps - likely due spontaneous bleeding in setting of coagulopathy - follow Hgb - if continues to drop w/ correction of of coagulopathy may require GI w/u   Acute blood loss anemia  Baseline Hgb 14 - Hgb 9.8 at presentation - recheck now and in AM - appears hemodynamically stable at present   Coagulopathy due to supratherapeutic warfarin  Due to warfarin + cipro - reversed - follow INR trend - hold anticoag until GIB status clear   Hypotension Resolved w/ volume expansion   C diff colitis  Due to prolonged course of cipro - oral vanc for 14 days - avoid other abx as able   Systolic CHF - Ischemic - s/p CRT D EF 20% - well compensated at present after coming in dehydrated - follow Is/Os and weights   Afib  CHA2DS2-VASc 4 - intolerant of amio - QT too long for Tikosyn - rate presently controlled - followed by Dr. Martinique   CAD Remote anterior MI - keep Hgb 8.0 or >  Abnormal UA Avoid abx if able - f/u  culture   CKD stage 3 Baseline crt 1.7 - crt better than baseline presently   Code Status: FULL Family Communication: no family present at time of exam Disposition Plan: SDU  Consultants: none  Procedures: none  Antibiotics: none  DVT prophylaxis: SCDs  Objective: Blood pressure 146/75, pulse 108, temperature 98.3 F (36.8 C), temperature source Oral, resp. rate 19, height 5\' 10"  (1.778 m), weight 100.1 kg (220 lb 10.9 oz), SpO2 98 %.  Intake/Output Summary (Last 24 hours) at 02/19/16 1547 Last data filed at 02/19/16 1001  Gross per 24 hour  Intake   1150 ml  Output    825 ml  Net    325 ml   Exam: General: No acute respiratory distress Lungs: Clear to auscultation bilaterally without wheezes or crackles Cardiovascular: Regular rate without murmur gallop or rub normal S1 and S2 Abdomen: Mildly tender diffusely, nondistended, soft, bowel sounds positive, no rebound, no ascites, no appreciable mass Extremities: No significant cyanosis, clubbing, or edema bilateral lower extremities  Data Reviewed:  Basic Metabolic Panel:  Recent Labs Lab 02/18/16 0940 02/19/16 0007  NA 144 146*  K 4.7 4.3  CL 112* 115*  CO2 23 21*  GLUCOSE 109* 98  BUN 58* 47*  CREATININE 1.71* 1.41*  CALCIUM 8.7* 8.6*    CBC:  Recent Labs Lab 02/18/16 0940  WBC 9.6  NEUTROABS 6.9  HGB 9.8*  HCT 29.0*  MCV 80.6  PLT 251    Liver Function  Tests:  Recent Labs Lab 02/18/16 0940  AST 16  ALT 10*  ALKPHOS 23*  BILITOT 0.4  PROT 5.7*  ALBUMIN 3.0*    Recent Labs Lab 02/18/16 0940  LIPASE 54*   Coags:  Recent Labs Lab 02/18/16 1002 02/18/16 1505 02/19/16 0006 02/19/16 0904  INR 8.68* 3.28* 1.68* 1.54*    Recent Results (from the past 240 hour(s))  Urine culture     Status: None   Collection Time: 02/18/16  9:35 AM  Result Value Ref Range Status   Specimen Description URINE, CLEAN CATCH  Final   Special Requests NONE  Final   Culture NO GROWTH 1 DAY  Final    Report Status 02/19/2016 FINAL  Final  MRSA PCR Screening     Status: None   Collection Time: 02/18/16  8:41 PM  Result Value Ref Range Status   MRSA by PCR NEGATIVE NEGATIVE Final    Comment:        The GeneXpert MRSA Assay (FDA approved for NASAL specimens only), is one component of a comprehensive MRSA colonization surveillance program. It is not intended to diagnose MRSA infection nor to guide or monitor treatment for MRSA infections.   C difficile quick scan w PCR reflex     Status: Abnormal   Collection Time: 02/18/16 11:00 PM  Result Value Ref Range Status   C Diff antigen POSITIVE (A) NEGATIVE Final   C Diff toxin POSITIVE (A) NEGATIVE Final   C Diff interpretation Positive for toxigenic C. difficile  Final    Comment: CRITICAL RESULT CALLED TO, READ BACK BY AND VERIFIED WITH: AMY KORSLIEN,RN @0108  02/19/16 MKELLY      Studies:   Recent x-ray studies have been reviewed in detail by the Attending Physician  Scheduled Meds:  Scheduled Meds: . acidophilus  2 capsule Oral Daily  . digoxin  0.125 mg Oral Daily  . feeding supplement  1 Container Oral TID BM  . folic acid  [id number] mcg Oral Daily  . polyethylene glycol  17 g Oral Daily  . potassium chloride SA  20 mEq Oral Daily  . pravastatin  40 mg Oral q1800  . sertraline  100 mg Oral Daily  . sodium chloride flush  3 mL Intravenous Q12H  . vancomycin  125 mg Oral QID  . vitamin C  500 mg Oral Daily    Time spent on care of this patient: 35 mins   Gabriela Giannelli T , MD   [facility] Office  [phone] Pager - Text Page per Shea Evans as per below:  On-Call/Text Page:      [email or web address]      password [email or web address]  If 7PM-7AM, please contact night-coverage [email or web address] Password [email or web address] 02/19/2016, 3:47 PM   LOS: 1 day

## 2016-02-19 NOTE — Progress Notes (Signed)
Pharmacy Antibiotic Note  ROZAY GILLELAND is a 70 y.o. male admitted on 02/18/2016 with Hypotension/Dizziness.  Pharmacy has been consulted for C diff management. This is the patients first occurrence per NP.   Plan: -Vancomycin oral solution 125 mg PO QID x 14 days  Height: 5\' 10"  (177.8 cm) Weight: 215 lb (97.523 kg) IBW/kg (Calculated) : 73  Temp (24hrs), Avg:98.1 F (36.7 C), Min:98 F (36.7 C), Max:98.4 F (36.9 C)   Recent Labs Lab 02/18/16 0907 02/18/16 0940 02/19/16 0007  WBC  --  9.6  --   CREATININE  --  1.71* 1.41*  LATICACIDVEN 0.83  --   --     Estimated Creatinine Clearance: 58.7 mL/min (by C-G formula based on Cr of 1.41).    Allergies  Allergen Reactions  . Keflex [Cephalexin] Shortness Of Breath and Other (See Comments)    dizziness    Narda Bonds 02/19/2016 4:12 AM

## 2016-02-20 LAB — COMPREHENSIVE METABOLIC PANEL
ALBUMIN: 2.8 g/dL — AB (ref 3.5–5.0)
ALT: 10 U/L — ABNORMAL LOW (ref 17–63)
ANION GAP: 6 (ref 5–15)
AST: 17 U/L (ref 15–41)
Alkaline Phosphatase: 23 U/L — ABNORMAL LOW (ref 38–126)
BILIRUBIN TOTAL: 0.6 mg/dL (ref 0.3–1.2)
BUN: 20 mg/dL (ref 6–20)
CHLORIDE: 112 mmol/L — AB (ref 101–111)
CO2: 24 mmol/L (ref 22–32)
Calcium: 8.8 mg/dL — ABNORMAL LOW (ref 8.9–10.3)
Creatinine, Ser: 1.32 mg/dL — ABNORMAL HIGH (ref 0.61–1.24)
GFR calc Af Amer: >60 mL/min (ref >60)
GFR calc non Af Amer: 54 mL/min — ABNORMAL LOW (ref >60)
GLUCOSE: 115 mg/dL — AB (ref 65–99)
POTASSIUM: 3.9 mmol/L (ref 3.5–5.1)
Sodium: 142 mmol/L (ref 135–145)
TOTAL PROTEIN: 5.6 g/dL — AB (ref 6.5–8.1)

## 2016-02-20 LAB — DIGOXIN LEVEL: DIGOXIN LVL: 0.4 ng/mL — AB (ref 0.8–2.0)

## 2016-02-20 LAB — PROTIME-INR
INR: 1.46 (ref 0.00–1.49)
PROTHROMBIN TIME: 17.9 s — AB (ref 11.6–15.2)

## 2016-02-20 LAB — CBC
HEMATOCRIT: 27.6 % — AB (ref 39.0–52.0)
Hemoglobin: 8.5 g/dL — ABNORMAL LOW (ref 13.0–17.0)
MCH: 25.3 pg — ABNORMAL LOW (ref 26.0–34.0)
MCHC: 30.8 g/dL (ref 30.0–36.0)
MCV: 82.1 fL (ref 78.0–100.0)
PLATELETS: 266 10*3/uL (ref 150–400)
RBC: 3.36 MIL/uL — ABNORMAL LOW (ref 4.22–5.81)
RDW: 14.6 % (ref 11.5–15.5)
WBC: 9.5 10*3/uL (ref 4.0–10.5)

## 2016-02-20 MED ORDER — SIMETHICONE 40 MG/0.6ML PO SUSP
40.0000 mg | Freq: Four times a day (QID) | ORAL | Status: DC | PRN
  Administered 2016-02-20 – 2016-02-22 (×2): 40 mg via ORAL
  Filled 2016-02-20 (×3): qty 0.6

## 2016-02-20 MED ORDER — ZOLPIDEM TARTRATE 5 MG PO TABS
5.0000 mg | ORAL_TABLET | Freq: Every evening | ORAL | Status: DC | PRN
  Administered 2016-02-20 – 2016-02-21 (×2): 5 mg via ORAL
  Filled 2016-02-20 (×2): qty 1

## 2016-02-20 NOTE — Plan of Care (Signed)
Problem: Bowel/Gastric: Goal: Will show no signs and symptoms of gastrointestinal bleeding No BM yet for today  Problem: Fluid Volume: Goal: Will show no signs and symptoms of excessive bleeding CBC ordered   Problem: Physical Regulation: Goal: Complications related to the disease process, condition or treatment will be avoided or minimized Pt shows no active s&s of bleeding

## 2016-02-20 NOTE — Progress Notes (Signed)
Report called and given to Joellen Jersey, RN

## 2016-02-20 NOTE — Progress Notes (Signed)
[facility] TEAM 1 - Stepdown/ICU TEAM PROGRESS NOTE  [name] [id number] DOB: 10-26-1946 DOA: 02/18/2016 PCP: [name], MD  Admit HPI / Brief Narrative: 70 y.o. male who presented w/ complaints of hypotension and dizziness. Patient stated he had various somatic complaints ever since he had a TURP on 01/25/2016, to include intermittent abdominal cramping, bloating, constipation. Over the 24 hours prior to his admit he experienced extreme fatigue, dizziness upon standing, and shortness of breath on exertion. Patient had been taking ciprofloxacin since his surgery. Patient stated his blood pressure medications were changed 2 weeks prior due to hypotension. Patient took his BP at home and stated systolic was in the Q000111Q.   HPI/Subjective: The patient continues to complain of bloating and nausea but states that it did improve after using Phenergan.  He denies chest pain shortness of breath fevers or chills.  Assessment/Plan:  GI bleed - FOBT positive / melena  colonoscopy approximate 4 years ago which showed few colonic polyps - likely due spontaneous bleeding in setting of signif coagulopathy - Hgb appears to be stabilizing coincident w/ correction of coagulopathy - follow w/ no plan for endoscopic exam unless clear bleeding resumes   Acute blood loss anemia  Baseline Hgb 14 - Hgb 9.8 at presentation - appears hemodynamically stable at present - Hgb stable/improving at this time - follow in serial fashion    Coagulopathy due to supratherapeutic warfarin  Due to warfarin + cipro - reversed - follow INR trend - hold anticoag until GIB status clear (consider resuming w/o overlap Sun or Monday  Hypotension Resolved w/ volume expansion   C diff colitis  Due to prolonged course of cipro - oral vanc for 14 days - avoid other abx as able   Systolic CHF - Ischemic - s/p CRT D EF 20% - well compensated at present after coming in dehydrated - follow Is/Os and weights   Afib    CHA2DS2-VASc 4 - intolerant of amio - QT too long for Tikosyn - rate presently controlled - followed by Dr. Martinique - rate reasonably controlled at present - need to hole coumadin and risk of CVA associated w/ this explained to pt - consider resuming warfarin in 24-48hrs   CAD Remote anterior MI - keep Hgb 8.0 or >  Abnormal UA Avoid abx if able - culture unrevealing thus far   CKD stage 3 Baseline crt 1.7 - crt better than baseline presently   Code Status: FULL Family Communication: no family present at time of exam Disposition Plan: stable for transfer to tele bed - begin PT/OT - eventual d/c home (2-3 days?)  Consultants: none  Procedures: none  Antibiotics: Oral Vanc 3/2 >  DVT prophylaxis: SCDs  Objective: Blood pressure 105/78, pulse 102, temperature 98.2 F (36.8 C), temperature source Axillary, resp. rate 16, height 5\' 10"  (1.778 m), weight 101 kg (222 lb 10.6 oz), SpO2 97 %.  Intake/Output Summary (Last 24 hours) at 02/20/16 1134 Last data filed at 02/20/16 0600  Gross per 24 hour  Intake 1200.83 ml  Output    925 ml  Net 275.83 ml   Exam: General: No acute respiratory distress - alert  Lungs: Clear to auscultation bilaterally without wheezes  Cardiovascular: Regular rate without murmur gallop or rub  Abdomen: Mildly tender diffusely, nondistended, soft, bowel sounds positive, no rebound Extremities: No significant cyanosis, clubbing, edema bilateral lower extremities  Data Reviewed:  Basic Metabolic Panel:  Recent Labs Lab 02/18/16 0940 02/19/16 0007 02/20/16 0208  NA 144  146* 142  K 4.7 4.3 3.9  CL 112* 115* 112*  CO2 23 21* 24  GLUCOSE 109* 98 115*  BUN 58* 47* 20  CREATININE 1.71* 1.41* 1.32*  CALCIUM 8.7* 8.6* 8.8*    CBC:  Recent Labs Lab 02/18/16 0940 02/19/16 1645 02/20/16 0208  WBC 9.6 8.2 9.5  NEUTROABS 6.9  --   --   HGB 9.8* 8.2* 8.5*  HCT 29.0* 26.3* 27.6*  MCV 80.6 81.9 82.1  PLT 251 230 266    Liver Function  Tests:  Recent Labs Lab 02/18/16 0940 02/20/16 0208  AST 16 17  ALT 10* 10*  ALKPHOS 23* 23*  BILITOT 0.4 0.6  PROT 5.7* 5.6*  ALBUMIN 3.0* 2.8*    Recent Labs Lab 02/18/16 0940  LIPASE 54*   Coags:  Recent Labs Lab 02/18/16 1505 02/19/16 0006 02/19/16 0904 02/19/16 1549 02/20/16 0208  INR 3.28* 1.68* 1.54* 1.48 1.46    Recent Results (from the past 240 hour(s))  Urine culture     Status: None   Collection Time: 02/18/16  9:35 AM  Result Value Ref Range Status   Specimen Description URINE, CLEAN CATCH  Final   Special Requests NONE  Final   Culture NO GROWTH 1 DAY  Final   Report Status 02/19/2016 FINAL  Final  MRSA PCR Screening     Status: None   Collection Time: 02/18/16  8:41 PM  Result Value Ref Range Status   MRSA by PCR NEGATIVE NEGATIVE Final    Comment:        The GeneXpert MRSA Assay (FDA approved for NASAL specimens only), is one component of a comprehensive MRSA colonization surveillance program. It is not intended to diagnose MRSA infection nor to guide or monitor treatment for MRSA infections.   C difficile quick scan w PCR reflex     Status: Abnormal   Collection Time: 02/18/16 11:00 PM  Result Value Ref Range Status   C Diff antigen POSITIVE (A) NEGATIVE Final   C Diff toxin POSITIVE (A) NEGATIVE Final   C Diff interpretation Positive for toxigenic C. difficile  Final    Comment: CRITICAL RESULT CALLED TO, READ BACK BY AND VERIFIED WITH: AMY KORSLIEN,RN @0108  02/19/16 MKELLY      Studies:   Recent x-ray studies have been reviewed in detail by the Attending Physician  Scheduled Meds:  Scheduled Meds: . acidophilus  2 capsule Oral Daily  . carvedilol  6.25 mg Oral BID WC  . digoxin  0.125 mg Oral Daily  . feeding supplement  1 Container Oral TID BM  . folic acid  [id number] mcg Oral Daily  . potassium chloride SA  20 mEq Oral Daily  . pravastatin  40 mg Oral q1800  . sertraline  100 mg Oral Daily  . sodium chloride flush  3 mL  Intravenous Q12H  . vancomycin  125 mg Oral QID  . vitamin C  500 mg Oral Daily    Time spent on care of this patient: 35 mins   Nour Rodrigues T , MD   [facility] Office  [phone] Pager - Text Page per Shea Evans as per below:  On-Call/Text Page:      [email or web address]      password [email or web address]  If 7PM-7AM, please contact night-coverage [email or web address] Password [email or web address] 02/20/2016, 11:34 AM   LOS: 2 days

## 2016-02-20 NOTE — Progress Notes (Signed)
NURSING PROGRESS NOTE  [name] [id number] Transfer Data: 02/20/2016 2:13 PM Attending Provider: Cherene Altes, MD [id number], MD Code Status: FULL  [name] is a 70 y.o. male patient transferred from [place]  -No acute distress noted.  -No complaints of shortness of breath.  -No complaints of chest pain.   Blood pressure 120/63, pulse 113, temperature 98.2 F (36.8 C), temperature source Oral, resp. rate 18, height 5\' 10"  (1.778 m), weight 101 kg (222 lb 10.6 oz), SpO2 96 %.   IV Fluids:  IV in place,may refer to [facility] Allergies:  Keflex  Past Medical History:   has a past medical history of Coronary artery disease; MI (myocardial infarction) ([place]) (2000); Ischemic cardiomyopathy; Persistent atrial fibrillation ([place]); Chronic renal insufficiency; Dyslipidemia; Depression; ED (erectile dysfunction); Other primary cardiomyopathies; Presence of permanent cardiac pacemaker; AICD (automatic cardioverter/defibrillator) present; Dysrhythmia; CHF (congestive heart failure) ([place]); and Shortness of breath dyspnea.  Past Surgical History:   has past surgical history that includes Cardiac catheterization (02/05/2008); Coronary stent placement; transthoracic echocardiogram (12/2010); Cardiac defibrillator placement (2009); Wrist fracture surgery; Breast lumpectomy; Implantable cardioverter defibrillator generator change (2014); Biv icd genertaor change out (N/A, 09/16/2013); Insert / replace / remove pacemaker; Coronary angioplasty (2000); Cystoscopy (N/A, 01/25/2016); and Transurethral resection of prostate (N/A, 01/25/2016).  Social History:   reports that he quit smoking about 17 years ago. His smoking use included Cigarettes. He does not have any smokeless tobacco history on file. He reports that he does not drink alcohol or use illicit drugs.  Skin: Intact  Patient/Family orientated to room. Information packet given to patient/family. Admission inpatient armband information verified  with patient/family to include name and date of birth and placed on patient arm. Side rails up x 2, fall assessment and education completed with patient/family. Patient/family able to verbalize understanding of risk associated with falls and verbalized understanding to call for assistance before getting out of bed. Call light within reach. Patient/family able to voice and demonstrate understanding of unit orientation instructions.    Will continue to evaluate and treat per MD orders.

## 2016-02-21 LAB — CBC
HEMATOCRIT: 26.1 % — AB (ref 39.0–52.0)
HEMOGLOBIN: 8.1 g/dL — AB (ref 13.0–17.0)
MCH: 25.4 pg — ABNORMAL LOW (ref 26.0–34.0)
MCHC: 31 g/dL (ref 30.0–36.0)
MCV: 81.8 fL (ref 78.0–100.0)
Platelets: 228 10*3/uL (ref 150–400)
RBC: 3.19 MIL/uL — AB (ref 4.22–5.81)
RDW: 14.6 % (ref 11.5–15.5)
WBC: 7.8 10*3/uL (ref 4.0–10.5)

## 2016-02-21 LAB — COMPREHENSIVE METABOLIC PANEL
ALK PHOS: 24 U/L — AB (ref 38–126)
ALT: 10 U/L — AB (ref 17–63)
AST: 16 U/L (ref 15–41)
Albumin: 2.7 g/dL — ABNORMAL LOW (ref 3.5–5.0)
Anion gap: 9 (ref 5–15)
BILIRUBIN TOTAL: 0.7 mg/dL (ref 0.3–1.2)
BUN: 11 mg/dL (ref 6–20)
CHLORIDE: 109 mmol/L (ref 101–111)
CO2: 25 mmol/L (ref 22–32)
CREATININE: 1.26 mg/dL — AB (ref 0.61–1.24)
Calcium: 9 mg/dL (ref 8.9–10.3)
GFR calc Af Amer: >60 mL/min (ref >60)
GFR, EST NON AFRICAN AMERICAN: 57 mL/min — AB (ref >60)
Glucose, Bld: 95 mg/dL (ref 65–99)
Potassium: 4.1 mmol/L (ref 3.5–5.1)
Sodium: 143 mmol/L (ref 135–145)
TOTAL PROTEIN: 5.3 g/dL — AB (ref 6.5–8.1)

## 2016-02-21 LAB — PROTIME-INR
INR: 1.46 (ref 0.00–1.49)
PROTHROMBIN TIME: 17.8 s — AB (ref 11.6–15.2)

## 2016-02-21 NOTE — Progress Notes (Signed)
[place] TEAM 1 - Stepdown/ICU TEAM PROGRESS NOTE  [name] [id number] DOB: 1946-07-08 DOA: 02/18/2016 PCP: [name], MD  Admit HPI / Brief Narrative: 70 y.o. male who presented w/ complaints of hypotension and dizziness. Patient stated he had various somatic complaints ever since he had a TURP on 01/25/2016, to include intermittent abdominal cramping, bloating, constipation. Over the 24 hours prior to his admit he experienced extreme fatigue, dizziness upon standing, and shortness of breath on exertion. Patient had been taking ciprofloxacin since his surgery. Patient stated his blood pressure medications were changed 2 weeks prior due to hypotension. Patient took his BP at home and stated systolic was in the Q000111Q.   HPI/Subjective:  DENIES ANY RECURRENT BLEEDING,  He denies chest pain shortness of breath fevers or chills.  Assessment/Plan:  GI bleed - FOBT positive / melena  colonoscopy approximate 4 years ago which showed few colonic polyps - likely due spontaneous bleeding in setting of signif coagulopathy - Hgb appears to be stabilizing coincident w/ correction of coagulopathy - follow w/ no plan for endoscopic exam  PATIENT ADVANCE TO SOFT DIET,  Acute blood loss anemia  Baseline Hgb 14 - Hgb 9.8> 8.1 today - appears hemodynamically stable at present - Hgb stable/improving at this time - follow in serial fashion    Coagulopathy due to supratherapeutic warfarin  Due to warfarin + cipro - reversed - follow INR trend - hold anticoag until GIB status clear  , anticipate resuming Coumadin tomorrow   Hypotension Resolved w/ volume expansion   C diff colitis  Due to prolonged course of cipro - oral vanc for 14 days - avoid other abx as able   Systolic CHF - Ischemic - s/p CRT D EF 20% - well compensated at present after coming in dehydrated - follow Is/Os and weights   Afib  CHA2DS2-VASc 4 - intolerant of amio - QT too long for Tikosyn - rate presently controlled -  followed by Dr. Martinique - rate reasonably controlled at present - need to hole coumadin and risk of CVA associated w/ this explained to pt - consider resuming warfarin in 24-48hrs   CAD Remote anterior MI - keep Hgb 8.0 or >  Abnormal UA Avoid abx if able - culture unrevealing thus far   CKD stage 3 Baseline crt 1.7 - crt better than baseline presently   Code Status: FULL Family Communication: no family present at time of exam Disposition Plan: continue telemetry, PT OT consultation   Consultants: none  Procedures: none  Antibiotics: Oral Vanc 3/2 >  DVT prophylaxis: SCDs  Objective: Blood pressure 122/78, pulse 100, temperature 98 F (36.7 C), temperature source Oral, resp. rate 18, height 5\' 10"  (1.778 m), weight 96.616 kg (213 lb), SpO2 100 %.  Intake/Output Summary (Last 24 hours) at 02/21/16 1236 Last data filed at 02/21/16 0900  Gross per 24 hour  Intake  795.5 ml  Output    915 ml  Net -119.5 ml   Exam: General: No acute respiratory distress - alert  Lungs: Clear to auscultation bilaterally without wheezes  Cardiovascular: Regular rate without murmur gallop or rub  Abdomen: Mildly tender diffusely, nondistended, soft, bowel sounds positive, no rebound Extremities: No significant cyanosis, clubbing, edema bilateral lower extremities  Data Reviewed:  Basic Metabolic Panel:  Recent Labs Lab 02/18/16 0940 02/19/16 0007 02/20/16 0208 02/21/16 0705  NA 144 146* 142 143  K 4.7 4.3 3.9 4.1  CL 112* 115* 112* 109  CO2 23 21* 24 25  GLUCOSE 109* 98 115* 95  BUN 58* 47* 20 11  CREATININE 1.71* 1.41* 1.32* 1.26*  CALCIUM 8.7* 8.6* 8.8* 9.0    CBC:  Recent Labs Lab 02/18/16 0940 02/19/16 1645 02/20/16 0208 02/21/16 0705  WBC 9.6 8.2 9.5 7.8  NEUTROABS 6.9  --   --   --   HGB 9.8* 8.2* 8.5* 8.1*  HCT 29.0* 26.3* 27.6* 26.1*  MCV 80.6 81.9 82.1 81.8  PLT 251 230 266 228    Liver Function Tests:  Recent Labs Lab 02/18/16 0940 02/20/16 0208  02/21/16 0705  AST 16 17 16   ALT 10* 10* 10*  ALKPHOS 23* 23* 24*  BILITOT 0.4 0.6 0.7  PROT 5.7* 5.6* 5.3*  ALBUMIN 3.0* 2.8* 2.7*    Recent Labs Lab 02/18/16 0940  LIPASE 54*   Coags:  Recent Labs Lab 02/19/16 0006 02/19/16 0904 02/19/16 1549 02/20/16 0208 02/21/16 0705  INR 1.68* 1.54* 1.48 1.46 1.46    Recent Results (from the past 240 hour(s))  Urine culture     Status: None   Collection Time: 02/18/16  9:35 AM  Result Value Ref Range Status   Specimen Description URINE, CLEAN CATCH  Final   Special Requests NONE  Final   Culture NO GROWTH 1 DAY  Final   Report Status 02/19/2016 FINAL  Final  MRSA PCR Screening     Status: None   Collection Time: 02/18/16  8:41 PM  Result Value Ref Range Status   MRSA by PCR NEGATIVE NEGATIVE Final    Comment:        The GeneXpert MRSA Assay (FDA approved for NASAL specimens only), is one component of a comprehensive MRSA colonization surveillance program. It is not intended to diagnose MRSA infection nor to guide or monitor treatment for MRSA infections.   C difficile quick scan w PCR reflex     Status: Abnormal   Collection Time: 02/18/16 11:00 PM  Result Value Ref Range Status   C Diff antigen POSITIVE (A) NEGATIVE Final   C Diff toxin POSITIVE (A) NEGATIVE Final   C Diff interpretation Positive for toxigenic C. difficile  Final    Comment: CRITICAL RESULT CALLED TO, READ BACK BY AND VERIFIED WITH: AMY KORSLIEN,RN @0108  02/19/16 MKELLY      Studies:   Recent x-ray studies have been reviewed in detail by the Attending Physician  Scheduled Meds:  Scheduled Meds: . acidophilus  2 capsule Oral Daily  . carvedilol  6.25 mg Oral BID WC  . digoxin  0.125 mg Oral Daily  . feeding supplement  1 Container Oral TID BM  . folic acid  [id number] mcg Oral Daily  . potassium chloride SA  20 mEq Oral Daily  . pravastatin  40 mg Oral q1800  . sertraline  100 mg Oral Daily  . sodium chloride flush  3 mL Intravenous Q12H    . vancomycin  125 mg Oral QID  . vitamin C  500 mg Oral Daily    Time spent on care of this patient: 71 mins   Reyne Dumas , MD   [facility] Office  [phone] Pager - Text Page per Shea Evans as per below:  On-Call/Text Page:      [email or web address]      password [email or web address]  If 7PM-7AM, please contact night-coverage [email or web address] Password [email or web address] 02/21/2016, 12:36 PM   LOS: 3 days

## 2016-02-22 ENCOUNTER — Encounter (HOSPITAL_COMMUNITY)
Admission: EM | Disposition: A | Discharge status: Skilled Nursing Facility | Payer: Self-pay | Source: Home / Self Care | Attending: Internal Medicine

## 2016-02-22 ENCOUNTER — Inpatient Hospital Stay (HOSPITAL_COMMUNITY): Payer: Medicare Other

## 2016-02-22 ENCOUNTER — Encounter (HOSPITAL_COMMUNITY): Payer: Self-pay | Admitting: Radiology

## 2016-02-22 ENCOUNTER — Inpatient Hospital Stay (HOSPITAL_COMMUNITY): Payer: Medicare Other | Admitting: Anesthesiology

## 2016-02-22 DIAGNOSIS — I482 Chronic atrial fibrillation: Secondary | ICD-10-CM

## 2016-02-22 DIAGNOSIS — K275 Chronic or unspecified peptic ulcer, site unspecified, with perforation: Secondary | ICD-10-CM | POA: Diagnosis present

## 2016-02-22 DIAGNOSIS — J9601 Acute respiratory failure with hypoxia: Secondary | ICD-10-CM | POA: Diagnosis not present

## 2016-02-22 DIAGNOSIS — Z7901 Long term (current) use of anticoagulants: Secondary | ICD-10-CM

## 2016-02-22 DIAGNOSIS — I255 Ischemic cardiomyopathy: Secondary | ICD-10-CM | POA: Diagnosis present

## 2016-02-22 DIAGNOSIS — A0472 Enterocolitis due to Clostridium difficile, not specified as recurrent: Secondary | ICD-10-CM | POA: Diagnosis present

## 2016-02-22 DIAGNOSIS — I951 Orthostatic hypotension: Secondary | ICD-10-CM

## 2016-02-22 DIAGNOSIS — I2510 Atherosclerotic heart disease of native coronary artery without angina pectoris: Secondary | ICD-10-CM

## 2016-02-22 DIAGNOSIS — Z9861 Coronary angioplasty status: Secondary | ICD-10-CM

## 2016-02-22 DIAGNOSIS — R1013 Epigastric pain: Secondary | ICD-10-CM | POA: Diagnosis not present

## 2016-02-22 HISTORY — PX: LAPAROTOMY: SHX154

## 2016-02-22 LAB — POCT I-STAT 3, ART BLOOD GAS (G3+)
ACID-BASE DEFICIT: 3 mmol/L — AB (ref 0.0–2.0)
Bicarbonate: 21.9 mEq/L (ref 20.0–24.0)
O2 Saturation: 99 %
PH ART: 7.385 (ref 7.350–7.450)
PO2 ART: 122 mmHg — AB (ref 80.0–100.0)
TCO2: 23 mmol/L (ref 0–100)
pCO2 arterial: 36.7 mmHg (ref 35.0–45.0)

## 2016-02-22 LAB — COMPREHENSIVE METABOLIC PANEL
ALBUMIN: 2.7 g/dL — AB (ref 3.5–5.0)
ALT: 11 U/L — AB (ref 17–63)
AST: 17 U/L (ref 15–41)
Alkaline Phosphatase: 30 U/L — ABNORMAL LOW (ref 38–126)
Anion gap: 8 (ref 5–15)
BUN: 14 mg/dL (ref 6–20)
CALCIUM: 8.6 mg/dL — AB (ref 8.9–10.3)
CO2: 21 mmol/L — AB (ref 22–32)
Chloride: 111 mmol/L (ref 101–111)
Creatinine, Ser: 1.3 mg/dL — ABNORMAL HIGH (ref 0.61–1.24)
GFR calc Af Amer: >60 mL/min (ref >60)
GFR calc non Af Amer: 55 mL/min — ABNORMAL LOW (ref >60)
GLUCOSE: 130 mg/dL — AB (ref 65–99)
POTASSIUM: 4.1 mmol/L (ref 3.5–5.1)
SODIUM: 140 mmol/L (ref 135–145)
TOTAL PROTEIN: 5.3 g/dL — AB (ref 6.5–8.1)
Total Bilirubin: 0.6 mg/dL (ref 0.3–1.2)

## 2016-02-22 LAB — PROTIME-INR
INR: 1.61 — AB (ref 0.00–1.49)
Prothrombin Time: 19.2 seconds — ABNORMAL HIGH (ref 11.6–15.2)

## 2016-02-22 LAB — LIPASE, BLOOD: Lipase: 87 U/L — ABNORMAL HIGH (ref 11–51)

## 2016-02-22 LAB — PREPARE RBC (CROSSMATCH)

## 2016-02-22 LAB — TROPONIN I: Troponin I: <0.03 ng/mL (ref <0.031)

## 2016-02-22 LAB — D-DIMER, QUANTITATIVE: D-Dimer, Quant: 1.6 ug/mL-FEU — ABNORMAL HIGH (ref 0.00–0.50)

## 2016-02-22 LAB — SURGICAL PCR SCREEN
MRSA, PCR: NEGATIVE
STAPHYLOCOCCUS AUREUS: NEGATIVE

## 2016-02-22 SURGERY — LAPAROTOMY, EXPLORATORY
Anesthesia: General | Site: Abdomen

## 2016-02-22 MED ORDER — MORPHINE SULFATE (PF) 2 MG/ML IV SOLN
1.0000 mg | Freq: Once | INTRAVENOUS | Status: AC
  Administered 2016-02-22: 1 mg via INTRAVENOUS

## 2016-02-22 MED ORDER — PANTOPRAZOLE SODIUM 40 MG IV SOLR: 40.0000 mg | Freq: Two times a day (BID) | INTRAVENOUS | Status: DC

## 2016-02-22 MED ORDER — METRONIDAZOLE IN NACL 5-0.79 MG/ML-% IV SOLN
500.0000 mg | Freq: Three times a day (TID) | INTRAVENOUS | Status: DC
  Administered 2016-02-22 – 2016-02-24 (×5): 500 mg via INTRAVENOUS
  Filled 2016-02-22 (×5): qty 100

## 2016-02-22 MED ORDER — MIDAZOLAM HCL 2 MG/2ML IJ SOLN
INTRAMUSCULAR | Status: AC
  Filled 2016-02-22: qty 2

## 2016-02-22 MED ORDER — PROPOFOL 10 MG/ML IV BOLUS
INTRAVENOUS | Status: AC
  Filled 2016-02-22: qty 20

## 2016-02-22 MED ORDER — SODIUM CHLORIDE 0.9 % IV BOLUS (SEPSIS)
250.0000 mL | Freq: Once | INTRAVENOUS | Status: AC
  Administered 2016-02-22: 250 mL via INTRAVENOUS

## 2016-02-22 MED ORDER — SODIUM CHLORIDE 0.9 % IV SOLN
INTRAVENOUS | Status: AC
  Administered 2016-02-22: 12:00:00 via INTRAVENOUS

## 2016-02-22 MED ORDER — 0.9 % SODIUM CHLORIDE (POUR BTL) OPTIME
TOPICAL | Status: DC | PRN
  Administered 2016-02-22 (×2): 1000 mL

## 2016-02-22 MED ORDER — SUCCINYLCHOLINE CHLORIDE 20 MG/ML IJ SOLN
INTRAMUSCULAR | Status: AC
  Filled 2016-02-22: qty 1

## 2016-02-22 MED ORDER — WARFARIN - PHARMACIST DOSING INPATIENT: Freq: Every day | Status: DC

## 2016-02-22 MED ORDER — IOHEXOL 350 MG/ML SOLN
100.0000 mL | Freq: Once | INTRAVENOUS | Status: AC | PRN
  Administered 2016-02-22: 100 mL via INTRAVENOUS

## 2016-02-22 MED ORDER — SODIUM CHLORIDE 0.9 % IV SOLN
INTRAVENOUS | Status: DC | PRN
  Administered 2016-02-22: 19:00:00 via INTRAVENOUS

## 2016-02-22 MED ORDER — GLYCOPYRROLATE 0.2 MG/ML IJ SOLN
INTRAMUSCULAR | Status: AC
  Filled 2016-02-22: qty 3

## 2016-02-22 MED ORDER — METRONIDAZOLE IN NACL 5-0.79 MG/ML-% IV SOLN: 500.0000 mg | Freq: Three times a day (TID) | INTRAVENOUS | Status: DC

## 2016-02-22 MED ORDER — ASPIRIN 325 MG PO TABS
ORAL_TABLET | ORAL | Status: AC
  Filled 2016-02-22: qty 1

## 2016-02-22 MED ORDER — GI COCKTAIL ~~LOC~~
30.0000 mL | Freq: Once | ORAL | Status: AC
  Administered 2016-02-22: 30 mL via ORAL
  Filled 2016-02-22: qty 30

## 2016-02-22 MED ORDER — ASPIRIN 81 MG PO CHEW
CHEWABLE_TABLET | ORAL | Status: AC
  Filled 2016-02-22: qty 1

## 2016-02-22 MED ORDER — SODIUM CHLORIDE 0.9 % IV SOLN
8.0000 mg/h | INTRAVENOUS | Status: DC
  Filled 2016-02-22 (×2): qty 80

## 2016-02-22 MED ORDER — SUCCINYLCHOLINE CHLORIDE 20 MG/ML IJ SOLN
INTRAMUSCULAR | Status: DC | PRN
  Administered 2016-02-22: 100 mg via INTRAVENOUS

## 2016-02-22 MED ORDER — PROPOFOL 1000 MG/100ML IV EMUL
5.0000 ug/kg/min | INTRAVENOUS | Status: DC
  Administered 2016-02-22: 50 ug/kg/min via INTRAVENOUS
  Filled 2016-02-22 (×2): qty 100

## 2016-02-22 MED ORDER — PROPOFOL 1000 MG/100ML IV EMUL
0.0000 ug/kg/min | INTRAVENOUS | Status: DC
  Administered 2016-02-23: 50 ug/kg/min via INTRAVENOUS
  Administered 2016-02-23: 20 ug/kg/min via INTRAVENOUS
  Administered 2016-02-23: 40 ug/kg/min via INTRAVENOUS
  Filled 2016-02-22 (×3): qty 100

## 2016-02-22 MED ORDER — LIDOCAINE HCL (CARDIAC) 20 MG/ML IV SOLN
INTRAVENOUS | Status: AC
  Filled 2016-02-22: qty 5

## 2016-02-22 MED ORDER — ETOMIDATE 2 MG/ML IV SOLN
INTRAVENOUS | Status: DC | PRN
  Administered 2016-02-22: 14 mg via INTRAVENOUS

## 2016-02-22 MED ORDER — ANTISEPTIC ORAL RINSE SOLUTION (CORINZ)
7.0000 mL | Freq: Four times a day (QID) | OROMUCOSAL | Status: DC
  Administered 2016-02-23 – 2016-02-26 (×14): 7 mL via OROMUCOSAL

## 2016-02-22 MED ORDER — SIMETHICONE 40 MG/0.6ML PO SUSP
40.0000 mg | Freq: Once | ORAL | Status: AC
  Administered 2016-02-22: 40 mg via ORAL
  Filled 2016-02-22: qty 0.6

## 2016-02-22 MED ORDER — ETOMIDATE 2 MG/ML IV SOLN
INTRAVENOUS | Status: AC
  Filled 2016-02-22: qty 20

## 2016-02-22 MED ORDER — PIPERACILLIN-TAZOBACTAM 3.375 G IVPB
3.3750 g | Freq: Three times a day (TID) | INTRAVENOUS | Status: DC
  Administered 2016-02-23 – 2016-02-25 (×8): 3.375 g via INTRAVENOUS
  Filled 2016-02-22 (×10): qty 50

## 2016-02-22 MED ORDER — SODIUM CHLORIDE 0.9 % IV SOLN
INTRAVENOUS | Status: AC
  Administered 2016-02-22 – 2016-02-23 (×2): via INTRAVENOUS

## 2016-02-22 MED ORDER — LACTATED RINGERS IV SOLN
INTRAVENOUS | Status: DC | PRN
  Administered 2016-02-22: 18:00:00 via INTRAVENOUS

## 2016-02-22 MED ORDER — ROCURONIUM BROMIDE 50 MG/5ML IV SOLN
INTRAVENOUS | Status: AC
  Filled 2016-02-22: qty 3

## 2016-02-22 MED ORDER — ASPIRIN 325 MG PO TABS
325.0000 mg | ORAL_TABLET | Freq: Once | ORAL | Status: AC
  Administered 2016-02-22: 325 mg via ORAL

## 2016-02-22 MED ORDER — BISACODYL 10 MG RE SUPP: 10.0000 mg | Freq: Every day | RECTAL | Status: DC | PRN

## 2016-02-22 MED ORDER — MORPHINE SULFATE (PF) 2 MG/ML IV SOLN
INTRAVENOUS | Status: AC
  Administered 2016-02-22: 1 mg via INTRAVENOUS
  Filled 2016-02-22: qty 1

## 2016-02-22 MED ORDER — CHLORHEXIDINE GLUCONATE 0.12% ORAL RINSE (MEDLINE KIT)
15.0000 mL | Freq: Two times a day (BID) | OROMUCOSAL | Status: DC
  Administered 2016-02-22 – 2016-02-28 (×11): 15 mL via OROMUCOSAL

## 2016-02-22 MED ORDER — SODIUM CHLORIDE 0.9 % IV SOLN: Freq: Once | INTRAVENOUS | Status: DC

## 2016-02-22 MED ORDER — LIDOCAINE HCL (CARDIAC) 20 MG/ML IV SOLN
INTRAVENOUS | Status: DC | PRN
  Administered 2016-02-22: 100 mg via INTRAVENOUS

## 2016-02-22 MED ORDER — ONDANSETRON HCL 4 MG/2ML IJ SOLN
INTRAMUSCULAR | Status: DC | PRN
  Administered 2016-02-22: 4 mg via INTRAVENOUS

## 2016-02-22 MED ORDER — SODIUM CHLORIDE 0.9 % IV SOLN
80.0000 mg | Freq: Once | INTRAVENOUS | Status: DC
  Filled 2016-02-22: qty 80

## 2016-02-22 MED ORDER — DIGOXIN 0.25 MG/ML IJ SOLN
0.1250 mg | Freq: Once | INTRAMUSCULAR | Status: AC
  Administered 2016-02-22: 0.125 mg via INTRAVENOUS
  Filled 2016-02-22: qty 0.5
  Filled 2016-02-22: qty 2

## 2016-02-22 MED ORDER — FENTANYL CITRATE (PF) 100 MCG/2ML IJ SOLN
50.0000 ug | INTRAMUSCULAR | Status: DC | PRN
  Administered 2016-02-23: 25 ug via INTRAVENOUS
  Filled 2016-02-22: qty 2

## 2016-02-22 MED ORDER — NITROGLYCERIN 0.4 MG SL SUBL
SUBLINGUAL_TABLET | SUBLINGUAL | Status: AC
  Filled 2016-02-22: qty 1

## 2016-02-22 MED ORDER — FENTANYL CITRATE (PF) 100 MCG/2ML IJ SOLN
INTRAMUSCULAR | Status: DC | PRN
  Administered 2016-02-22: 100 ug via INTRAVENOUS
  Administered 2016-02-22: 50 ug via INTRAVENOUS
  Administered 2016-02-22: 100 ug via INTRAVENOUS
  Administered 2016-02-22: 50 ug via INTRAVENOUS
  Administered 2016-02-22 (×2): 100 ug via INTRAVENOUS

## 2016-02-22 MED ORDER — CLINDAMYCIN PHOSPHATE 600 MG/50ML IV SOLN
600.0000 mg | INTRAVENOUS | Status: AC
  Administered 2016-02-22: 600 mg via INTRAVENOUS
  Filled 2016-02-22: qty 50

## 2016-02-22 MED ORDER — FOLIC ACID 5 MG/ML IJ SOLN
1.0000 mg | Freq: Every day | INTRAMUSCULAR | Status: DC
  Administered 2016-02-22 – 2016-02-24 (×3): 1 mg via INTRAVENOUS
  Filled 2016-02-22 (×3): qty 0.2

## 2016-02-22 MED ORDER — WARFARIN SODIUM 5 MG PO TABS: 5.0000 mg | ORAL_TABLET | Freq: Once | ORAL | Status: DC

## 2016-02-22 MED ORDER — ONDANSETRON HCL 4 MG/2ML IJ SOLN
INTRAMUSCULAR | Status: AC
  Filled 2016-02-22: qty 2

## 2016-02-22 MED ORDER — ROCURONIUM BROMIDE 100 MG/10ML IV SOLN
INTRAVENOUS | Status: DC | PRN
  Administered 2016-02-22 (×2): 20 mg via INTRAVENOUS
  Administered 2016-02-22: 30 mg via INTRAVENOUS

## 2016-02-22 MED ORDER — ARTIFICIAL TEARS OP OINT
TOPICAL_OINTMENT | OPHTHALMIC | Status: DC | PRN
  Administered 2016-02-22: 1 via OPHTHALMIC

## 2016-02-22 MED ORDER — FENTANYL CITRATE (PF) 250 MCG/5ML IJ SOLN
INTRAMUSCULAR | Status: AC
  Filled 2016-02-22: qty 5

## 2016-02-22 MED ORDER — FAMOTIDINE IN NACL 20-0.9 MG/50ML-% IV SOLN
20.0000 mg | Freq: Two times a day (BID) | INTRAVENOUS | Status: DC
  Administered 2016-02-23 (×3): 20 mg via INTRAVENOUS
  Filled 2016-02-22 (×4): qty 50

## 2016-02-22 MED ORDER — ENOXAPARIN SODIUM 40 MG/0.4ML ~~LOC~~ SOLN
40.0000 mg | SUBCUTANEOUS | Status: DC
  Administered 2016-02-22: 40 mg via SUBCUTANEOUS
  Filled 2016-02-22: qty 0.4

## 2016-02-22 MED ORDER — FENTANYL CITRATE (PF) 100 MCG/2ML IJ SOLN: 25.0000 ug | INTRAMUSCULAR | Status: DC | PRN

## 2016-02-22 MED ORDER — PROPOFOL 500 MG/50ML IV EMUL
INTRAVENOUS | Status: DC | PRN
  Administered 2016-02-22: 50 ug/kg/min via INTRAVENOUS

## 2016-02-22 MED ORDER — SODIUM CHLORIDE 0.9 % IV BOLUS (SEPSIS): 250.0000 mL | Freq: Once | INTRAVENOUS | Status: DC

## 2016-02-22 MED ORDER — FENTANYL CITRATE (PF) 100 MCG/2ML IJ SOLN
50.0000 ug | INTRAMUSCULAR | Status: DC | PRN
  Administered 2016-02-23 – 2016-02-24 (×3): 50 ug via INTRAVENOUS
  Filled 2016-02-22 (×3): qty 2

## 2016-02-22 MED ORDER — NITROGLYCERIN 0.4 MG SL SUBL
0.4000 mg | SUBLINGUAL_TABLET | SUBLINGUAL | Status: DC | PRN
  Administered 2016-02-22 (×2): 0.4 mg via SUBLINGUAL

## 2016-02-22 MED ORDER — MORPHINE SULFATE (PF) 2 MG/ML IV SOLN: 1.0000 mg | INTRAVENOUS | Status: DC | PRN

## 2016-02-22 MED ORDER — IPRATROPIUM-ALBUTEROL 0.5-2.5 (3) MG/3ML IN SOLN
3.0000 mL | Freq: Four times a day (QID) | RESPIRATORY_TRACT | Status: DC
  Administered 2016-02-23 – 2016-02-24 (×5): 3 mL via RESPIRATORY_TRACT
  Filled 2016-02-22 (×5): qty 3

## 2016-02-22 MED ORDER — MIDAZOLAM HCL 5 MG/5ML IJ SOLN
INTRAMUSCULAR | Status: DC | PRN
  Administered 2016-02-22: 0.5 mg via INTRAVENOUS

## 2016-02-22 MED ORDER — SODIUM CHLORIDE 0.9 % IJ SOLN
INTRAMUSCULAR | Status: AC
  Filled 2016-02-22: qty 10

## 2016-02-22 MED ORDER — DEXTROSE 5 % IV SOLN
10.0000 mg | INTRAVENOUS | Status: DC | PRN
  Administered 2016-02-22 (×2): 50 ug/min via INTRAVENOUS

## 2016-02-22 SURGICAL SUPPLY — 49 items
BIOPATCH RED 1 DISK 7.0 (GAUZE/BANDAGES/DRESSINGS) ×1 IMPLANT
BIOPATCH RED 1IN DISK 7.0MM (GAUZE/BANDAGES/DRESSINGS) ×1
BLADE SURG ROTATE 9660 (MISCELLANEOUS) IMPLANT
CANISTER SUCTION 2500CC (MISCELLANEOUS) ×3 IMPLANT
CANISTER WOUND CARE 500ML ATS (WOUND CARE) ×2 IMPLANT
CHLORAPREP W/TINT 26ML (MISCELLANEOUS) ×3 IMPLANT
COVER MAYO STAND STRL (DRAPES) ×2 IMPLANT
COVER SURGICAL LIGHT HANDLE (MISCELLANEOUS) ×3 IMPLANT
DRAIN CHANNEL 19F RND (DRAIN) ×2 IMPLANT
DRAPE LAPAROSCOPIC ABDOMINAL (DRAPES) ×3 IMPLANT
DRAPE WARM FLUID 44X44 (DRAPE) ×3 IMPLANT
DRSG OPSITE POSTOP 4X10 (GAUZE/BANDAGES/DRESSINGS) IMPLANT
DRSG OPSITE POSTOP 4X8 (GAUZE/BANDAGES/DRESSINGS) IMPLANT
DRSG VAC ATS MED SENSATRAC (GAUZE/BANDAGES/DRESSINGS) ×2 IMPLANT
ELECT BLADE 6.5 EXT (BLADE) IMPLANT
ELECT CAUTERY BLADE 6.4 (BLADE) ×5 IMPLANT
ELECT REM PT RETURN 9FT ADLT (ELECTROSURGICAL) ×3
ELECTRODE REM PT RTRN 9FT ADLT (ELECTROSURGICAL) ×1 IMPLANT
EVACUATOR SILICONE 100CC (DRAIN) ×2 IMPLANT
GLOVE BIO SURGEON STRL SZ 6.5 (GLOVE) ×1 IMPLANT
GLOVE BIO SURGEON STRL SZ7 (GLOVE) ×2 IMPLANT
GLOVE BIO SURGEONS STRL SZ 6.5 (GLOVE) ×1
GLOVE BIOGEL PI IND STRL 7.0 (GLOVE) IMPLANT
GLOVE BIOGEL PI IND STRL 7.5 (GLOVE) IMPLANT
GLOVE BIOGEL PI INDICATOR 7.0 (GLOVE) ×4
GLOVE BIOGEL PI INDICATOR 7.5 (GLOVE) ×2
GLOVE EUDERMIC 7 POWDERFREE (GLOVE) ×3 IMPLANT
GOWN STRL REUS W/ TWL LRG LVL3 (GOWN DISPOSABLE) ×1 IMPLANT
GOWN STRL REUS W/ TWL XL LVL3 (GOWN DISPOSABLE) ×1 IMPLANT
GOWN STRL REUS W/TWL LRG LVL3 (GOWN DISPOSABLE) ×3
GOWN STRL REUS W/TWL XL LVL3 (GOWN DISPOSABLE) ×3
KIT BASIN OR (CUSTOM PROCEDURE TRAY) ×3 IMPLANT
KIT ROOM TURNOVER OR (KITS) ×3 IMPLANT
LIGASURE IMPACT 36 18CM CVD LR (INSTRUMENTS) ×2 IMPLANT
NS IRRIG 1000ML POUR BTL (IV SOLUTION) ×6 IMPLANT
PACK GENERAL/GYN (CUSTOM PROCEDURE TRAY) ×3 IMPLANT
PAD ARMBOARD 7.5X6 YLW CONV (MISCELLANEOUS) ×3 IMPLANT
SPECIMEN JAR LARGE (MISCELLANEOUS) IMPLANT
SPONGE LAP 18X18 X RAY DECT (DISPOSABLE) ×2 IMPLANT
STAPLER VISISTAT 35W (STAPLE) ×3 IMPLANT
SUCTION POOLE TIP (SUCTIONS) ×5 IMPLANT
SUT PDS AB 1 TP1 96 (SUTURE) ×6 IMPLANT
SUT SILK 2 0 SH CR/8 (SUTURE) ×5 IMPLANT
SUT SILK 3 0 SH CR/8 (SUTURE) ×3 IMPLANT
SUT SILK 3 0 TIES 10X30 (SUTURE) ×3 IMPLANT
TOWEL OR 17X24 6PK STRL BLUE (TOWEL DISPOSABLE) ×3 IMPLANT
TOWEL OR 17X26 10 PK STRL BLUE (TOWEL DISPOSABLE) ×3 IMPLANT
TRAY FOLEY CATH 16FRSI W/METER (SET/KITS/TRAYS/PACK) IMPLANT
YANKAUER SUCT BULB TIP NO VENT (SUCTIONS) ×2 IMPLANT

## 2016-02-22 NOTE — Consult Note (Signed)
PULMONARY / CRITICAL CARE MEDICINE   Name: [name] MRN: [id number] DOB: December 07, 1946    ADMISSION DATE:  02/18/2016 CONSULTATION DATE:  02/22/16  REFERRING MD:  Donne Hazel  CHIEF COMPLAINT:  VDRF post op  HISTORY OF PRESENT ILLNESS:  Pt is encephelopathic; therefore, this HPI is obtained from chart review. [name] is a 70 y.o. male with PMH as outlined below. He was admitted 02/18/16 with abdominal pain, nausea, lower back pain.  He had a TURP done 02/04/16 and he felt symptoms started after that.  He has been on cipro since TURP was done. Pt with cdif related to cipro use.   On 02/18/16, he had SBP in 70's at home and felt so bad that he came to ED.  In ED, he was found to have GI bleed with elevated INR of 8.68, hypotension, anemia, a.fib  He was subsequently admitted for further evaluation and management.  On 02/22/16, he had CT of the abdomen and pelvis that revealed pneumoperitoneum suspicious for a perforated peptic ulcer.  He was seen by [facility] who took him to the OR for ex-lap.  He was found to have a perforated duodenal ulcer which was patched.  Post operatively, he returned to the ICU on the ventilator so PCCM was called for medical management.  PAST MEDICAL HISTORY :  He  has a past medical history of Coronary artery disease; MI (myocardial infarction) ([place]) (2000); Ischemic cardiomyopathy; Persistent atrial fibrillation ([place]); Chronic renal insufficiency; Dyslipidemia; Depression; ED (erectile dysfunction); Other primary cardiomyopathies; Presence of permanent cardiac pacemaker; AICD (automatic cardioverter/defibrillator) present; Dysrhythmia; CHF (congestive heart failure) ([place]); and Shortness of breath dyspnea.  PAST SURGICAL HISTORY: He  has past surgical history that includes Cardiac catheterization (02/05/2008); Coronary stent placement; transthoracic echocardiogram (12/2010); Cardiac defibrillator placement (2009); Wrist fracture surgery; Breast lumpectomy;  Implantable cardioverter defibrillator generator change (2014); Biv icd genertaor change out (N/A, 09/16/2013); Insert / replace / remove pacemaker; Coronary angioplasty (2000); Cystoscopy (N/A, 01/25/2016); and Transurethral resection of prostate (N/A, 01/25/2016).  Allergies  Allergen Reactions  . Keflex [Cephalexin] Shortness Of Breath and Other (See Comments)    dizziness    No current facility-administered medications on file prior to encounter.   Current Outpatient Prescriptions on File Prior to Encounter  Medication Sig  . Ascorbic Acid (VITAMIN C) 1000 MG tablet Take 500 mg by mouth daily.  . carvedilol (COREG) 25 MG tablet Take one-half tablet by  mouth twice a day with  meals (Patient taking differently: Take 6.25 mg by mouth daily. )  . DIGOX 125 MCG tablet Take one-half tablet by  mouth daily  . eplerenone (INSPRA) 25 MG tablet Take 1 tablet by mouth  daily (Patient taking differently: Take 1 tablet by mouth  daily at 12 noon)  . fenofibrate 160 MG tablet Take 1 tablet by mouth  daily  . furosemide (LASIX) 40 MG tablet Take 0.5 tablets (20 mg total) by mouth daily.  Marland Kitchen losartan (COZAAR) 25 MG tablet Take 1 tablet (25 mg total) by mouth daily.  . Methen-Hyosc-Meth Blue-Na Phos (UROGESIC-BLUE) 81.6 MG TABS Take 1 tablet 2-4 x/day as needed for urinary spasm or  burning  . metoprolol succinate (TOPROL-XL) 25 MG 24 hr tablet TAKE 1 TABLET BY MOUTH DAILY. TAKE WITH OR IMMEDIATELY FOLLOWING A MEAL  . Multiple Vitamin (MULTIVITAMIN WITH MINERALS) TABS tablet Take 1 tablet by mouth daily.  . nitroGLYCERIN (NITROSTAT) 0.4 MG SL tablet Place 1 tablet (0.4 mg total) under the tongue every 5 (five) minutes  as needed.  . potassium chloride SA (K-DUR,KLOR-CON) 20 MEQ tablet Take one-half tablet by  mouth daily  . pravastatin (PRAVACHOL) 40 MG tablet Take 1 tablet by mouth  daily (Patient taking differently: Take 1 tablet by mouth  daily in evening)  . sertraline (ZOLOFT) 100 MG tablet Take 100 mg by  mouth daily.    . cephALEXin (KEFLEX) 500 MG capsule Take 1 capsule (500 mg total) by mouth 2 (two) times daily. (Patient not taking: Reported on 01/31/2016)  . folic acid (FOLVITE) Q000111Q MCG tablet Take 800 mcg by mouth daily.   Marland Kitchen LORazepam (ATIVAN) 1 MG tablet Take 1 mg by mouth every 8 (eight) hours as needed for anxiety.   Marland Kitchen oxyCODONE-acetaminophen (ROXICET) 5-325 MG tablet Take 1 tablet by mouth every 4 (four) hours as needed for severe pain. (Patient not taking: Reported on 01/31/2016)    FAMILY HISTORY:  His indicated that his mother is deceased. He indicated that his father is deceased. He indicated that his sister is alive.   SOCIAL HISTORY: He  reports that he quit smoking about 17 years ago. His smoking use included Cigarettes. He does not have any smokeless tobacco history on file. He reports that he does not drink alcohol or use illicit drugs.  REVIEW OF SYSTEMS:  Unable to obtain as pt is encephalopathic.  SUBJECTIVE: On vent.  Non-responsive.  VITAL SIGNS: BP 98/58 mmHg  Pulse 112  Temp(Src) 99.2 F (37.3 C) (Oral)  Resp 15  Ht 5\' 10"  (1.778 m)  Wt 141.341 kg (311 lb 9.6 oz)  BMI 44.71 kg/m2  SpO2 100%  HEMODYNAMICS:    VENTILATOR SETTINGS: Vent Mode:  [-] PRVC FiO2 (%):  [50 %] 50 % Set Rate:  [15 bmp] 15 bmp Vt Set:  [590 mL] 590 mL PEEP:  [5 cmH20] 5 cmH20 Plateau Pressure:  [18 cmH20] 18 cmH20  INTAKE / OUTPUT: I/O last 3 completed shifts: In: [id number] [P.O.:860; I.V.:484] Out: 67 [Urine:925]   PHYSICAL EXAMINATION: General: Adult male, in NAD. Neuro: Sedated.  Does not follow commands. HEENT: [place]/AT. PERRL, sclerae anicteric. Cardiovascular: IRIR, no M/R/G.  Lungs: Respirations even and unlabored.  CTA bilaterally, No W/R/R. Abdomen: Midline incision with dressings C/D/I, vac in place.  BS hypoactive.  Musculoskeletal: No gross deformities, no edema.  Skin: Intact, warm, no rashes.  LABS:  BMET  Recent Labs Lab 02/20/16 0208 02/21/16 0705  02/22/16 0932  NA 142 143 140  K 3.9 4.1 4.1  CL 112* 109 111  CO2 24 25 21*  BUN 20 11 14   CREATININE 1.32* 1.26* 1.30*  GLUCOSE 115* 95 130*    Electrolytes  Recent Labs Lab 02/20/16 0208 02/21/16 0705 02/22/16 0932  CALCIUM 8.8* 9.0 8.6*    CBC  Recent Labs Lab 02/19/16 1645 02/20/16 0208 02/21/16 0705  WBC 8.2 9.5 7.8  HGB 8.2* 8.5* 8.1*  HCT 26.3* 27.6* 26.1*  PLT 230 266 228    Coag's  Recent Labs Lab 02/20/16 0208 02/21/16 0705 02/22/16 1607  INR 1.46 1.46 1.61*    Sepsis Markers  Recent Labs Lab 02/18/16 0907  LATICACIDVEN 0.83    ABG No results for input(s): PHART, PCO2ART, PO2ART in the last 168 hours.  Liver Enzymes  Recent Labs Lab 02/20/16 0208 02/21/16 0705 02/22/16 0932  AST 17 16 17   ALT 10* 10* 11*  ALKPHOS 23* 24* 30*  BILITOT 0.6 0.7 0.6  ALBUMIN 2.8* 2.7* 2.7*    Cardiac Enzymes  Recent Labs Lab 02/22/16 0932 02/22/16  [place] <0.03 <0.03    Glucose No results for input(s): GLUCAP in the last 168 hours.  Imaging Ct Angio Chest Pe W/cm &/or Wo Cm  02/22/2016  CLINICAL DATA:  Shortness of breath and chest pain. History of coronary artery disease, myocardial infarction and congestive heart failure. EXAM: CT ANGIOGRAPHY CHEST CT ABDOMEN AND PELVIS WITH CONTRAST TECHNIQUE: Multidetector CT imaging of the chest was performed using the standard protocol during bolus administration of intravenous contrast. Multiplanar CT image reconstructions and MIPs were obtained to evaluate the vascular anatomy. Multidetector CT imaging of the abdomen and pelvis was performed using the standard protocol during bolus administration of intravenous contrast. CONTRAST:  137mL OMNIPAQUE IOHEXOL 350 MG/ML SOLN COMPARISON:  Chest radiographs 02/22/2016 and 02/18/2016. Nuclear medicine pulmonary ventilation and perfusion study 01/31/2016. FINDINGS: CTA CHEST FINDINGS Mediastinum: The pulmonary arteries are well opacified with contrast.  There is no evidence of acute pulmonary embolism. There is atherosclerosis of the aorta, great vessels and coronary arteries.The heart is enlarged. There is a right subclavian pacemaker with leads in the right atrium, right ventricle and coronary sinus. There is no significant pericardial effusion. There are no enlarged mediastinal, hilar or axillary lymph nodes. The thyroid gland, trachea and esophagus demonstrate no significant findings. Lungs/Pleura: There is trace pleural fluid bilaterally. No pneumothorax or pneumomediastinum.Dependent pulmonary opacities bilaterally are most consistent with atelectasis. There is no consolidation or endobronchial lesion. Musculoskeletal/Chest wall: No chest wall lesion or acute osseous findings. CT ABDOMEN and PELVIS FINDINGS Hepatobiliary: The liver is normal in density without focal abnormality. There is a small gallstone. There is no significant gallbladder wall thickening or biliary dilatation. There is no pneumobilia. Pancreas: Unremarkable. No pancreatic ductal dilatation or surrounding inflammatory changes. Spleen: Normal in size without focal abnormality. Adrenals/Urinary Tract: Both adrenal glands appear normal. There are multiple exophytic low-density renal lesions bilaterally which are likely all cysts. Some of these are too small to optimally characterize. No evidence of enhancing renal mass, urinary tract calculus or hydronephrosis. There is bladder wall thickening with a recent trans urethral resection defect in the prostate gland. Stomach/Bowel: The distal stomach and proximal duodenum demonstrate irregular wall thickening and surrounding inflammation. There is an extraluminal fluid collection extending posteriorly from the duodenal bulb with surrounding extraluminal fluid and air, highly suspicious for perforation of an ulcer. There is a small amount of fluid surrounding the liver and spleen as well as a moderate amount of free intraperitoneal air. Extraluminal  air extends into the porta hepatis. No other bowel wall thickening or significant distention. The appendix appears normal. Vascular/Lymphatic: There are no enlarged abdominal or pelvic lymph nodes. There is diffuse atherosclerosis of the aorta, its branches and the iliac arteries. Reproductive: As above, there is a recent TUR defect within the prostate gland which is heterogeneous in density. Other: As above, pneumoperitoneum with free fluid in the upper abdomen. No generalized ascites or focal extraluminal fluid collection identified. Musculoskeletal: No acute or significant osseous findings. Review of the MIP images confirms the above findings. IMPRESSION: 1. Pneumoperitoneum with extraluminal air and fluid surrounding the distal stomach and proximal duodenum highly suspicious for a perforated peptic ulcer. General surgical evaluation recommended. 2. No evidence of bowel obstruction or abscess. 3. No evidence of acute pulmonary embolism. 4. Small bilateral pleural effusions with mild dependent atelectasis at both lung bases. 5. Moderate diffuse atherosclerosis. 6. Cholelithiasis, bilateral renal cysts and recent postsurgical changes in the prostate gland. 7. Critical Value/emergent results were called by telephone at the time of  interpretation on 02/22/2016 at 3:29 pm to Dr. Reyne Dumas , who verbally acknowledged these results. Electronically Signed   By: Richardean Sale M.D.   On: 02/22/2016 15:34   Ct Abdomen Pelvis W Contrast  02/22/2016  CLINICAL DATA:  Shortness of breath and chest pain. History of coronary artery disease, myocardial infarction and congestive heart failure. EXAM: CT ANGIOGRAPHY CHEST CT ABDOMEN AND PELVIS WITH CONTRAST TECHNIQUE: Multidetector CT imaging of the chest was performed using the standard protocol during bolus administration of intravenous contrast. Multiplanar CT image reconstructions and MIPs were obtained to evaluate the vascular anatomy. Multidetector CT imaging of the  abdomen and pelvis was performed using the standard protocol during bolus administration of intravenous contrast. CONTRAST:  14mL OMNIPAQUE IOHEXOL 350 MG/ML SOLN COMPARISON:  Chest radiographs 02/22/2016 and 02/18/2016. Nuclear medicine pulmonary ventilation and perfusion study 01/31/2016. FINDINGS: CTA CHEST FINDINGS Mediastinum: The pulmonary arteries are well opacified with contrast. There is no evidence of acute pulmonary embolism. There is atherosclerosis of the aorta, great vessels and coronary arteries.The heart is enlarged. There is a right subclavian pacemaker with leads in the right atrium, right ventricle and coronary sinus. There is no significant pericardial effusion. There are no enlarged mediastinal, hilar or axillary lymph nodes. The thyroid gland, trachea and esophagus demonstrate no significant findings. Lungs/Pleura: There is trace pleural fluid bilaterally. No pneumothorax or pneumomediastinum.Dependent pulmonary opacities bilaterally are most consistent with atelectasis. There is no consolidation or endobronchial lesion. Musculoskeletal/Chest wall: No chest wall lesion or acute osseous findings. CT ABDOMEN and PELVIS FINDINGS Hepatobiliary: The liver is normal in density without focal abnormality. There is a small gallstone. There is no significant gallbladder wall thickening or biliary dilatation. There is no pneumobilia. Pancreas: Unremarkable. No pancreatic ductal dilatation or surrounding inflammatory changes. Spleen: Normal in size without focal abnormality. Adrenals/Urinary Tract: Both adrenal glands appear normal. There are multiple exophytic low-density renal lesions bilaterally which are likely all cysts. Some of these are too small to optimally characterize. No evidence of enhancing renal mass, urinary tract calculus or hydronephrosis. There is bladder wall thickening with a recent trans urethral resection defect in the prostate gland. Stomach/Bowel: The distal stomach and proximal  duodenum demonstrate irregular wall thickening and surrounding inflammation. There is an extraluminal fluid collection extending posteriorly from the duodenal bulb with surrounding extraluminal fluid and air, highly suspicious for perforation of an ulcer. There is a small amount of fluid surrounding the liver and spleen as well as a moderate amount of free intraperitoneal air. Extraluminal air extends into the porta hepatis. No other bowel wall thickening or significant distention. The appendix appears normal. Vascular/Lymphatic: There are no enlarged abdominal or pelvic lymph nodes. There is diffuse atherosclerosis of the aorta, its branches and the iliac arteries. Reproductive: As above, there is a recent TUR defect within the prostate gland which is heterogeneous in density. Other: As above, pneumoperitoneum with free fluid in the upper abdomen. No generalized ascites or focal extraluminal fluid collection identified. Musculoskeletal: No acute or significant osseous findings. Review of the MIP images confirms the above findings. IMPRESSION: 1. Pneumoperitoneum with extraluminal air and fluid surrounding the distal stomach and proximal duodenum highly suspicious for a perforated peptic ulcer. General surgical evaluation recommended. 2. No evidence of bowel obstruction or abscess. 3. No evidence of acute pulmonary embolism. 4. Small bilateral pleural effusions with mild dependent atelectasis at both lung bases. 5. Moderate diffuse atherosclerosis. 6. Cholelithiasis, bilateral renal cysts and recent postsurgical changes in the prostate gland. 7. Critical Value/emergent results  were called by telephone at the time of interpretation on 23-Feb-2016 at 3:29 pm to Dr. Reyne Dumas , who verbally acknowledged these results. Electronically Signed   By: Richardean Sale M.D.   On: 2016-02-23 15:34   Portable Chest Xray  Feb 23, 2016  CLINICAL DATA:  Acute respiratory failure with hypoxia EXAM: PORTABLE CHEST 1 VIEW COMPARISON:   02/23/2016 FINDINGS: Endotracheal tube 4.3 cm above the carina. Internal jugular central line on the right with tip at the cavoatrial junction. Two lead cardiac pacer noted. NG tube appears to cross the gastroesophageal junction. Mild cardiac enlargement stable. Venous congestion without edema. Mild hypoventilatory change at both lung bases. IMPRESSION: Mild bibasilar opacity most consistent with atelectasis. Electronically Signed   By: Skipper Cliche M.D.   On: February 23, 2016 21:07   Dg Chest Port 1 View  02-23-16  CLINICAL DATA:  Onset of chest pain and shortness of breath now. EXAM: PORTABLE CHEST 1 VIEW COMPARISON:  PA and lateral chest x-ray of February 18, 2016 FINDINGS: The lungs are mildly hypoinflated. The interstitial markings are increased bilaterally. The cardiac silhouette is enlarged and the pulmonary vascularity is engorged. The permanent pacemaker defibrillator is in stable position. The mediastinum is normal in width. There is no pleural effusion or pneumothorax. IMPRESSION: CHF with mild pulmonary interstitial edema. There is no alveolar edema nor pneumonia. Electronically Signed   By: David  Martinique M.D.   On: 23-Feb-2016 08:24     STUDIES:  CT A / P and CTA chest 23-Feb-2023 > pneumoperitoneum with extraluminal air and fluid surrounding the distal stomach and proximal duodenum.  No evidence of bowel obstruction or abscess.  No PE.  CULTURES: 03/02 > C.diff POS  ANTIBIOTICS: Clinda February 23, 2023 x 1 Vanc 03/03 >  Flagyl03/06 > Zosyn 3/6 >  SIGNIFICANT EVENTS: 03/02 > admitted with abd pain. 23-Feb-2023 > to OR for ex lap and repair of perforated duodenal ulcer.  LINES/TUBES: ETT 02-23-23 > Abd vac drain Feb 23, 2023 >  DISCUSSION: 70 y.o. M admitted 03/02 with abd pain.  Found to have pneumoperitoneum 02-23-2023 so taken to OR for ex lap with repair of perforated duodenal ulcer.  Returned to ICU on vent.   ASSESSMENT / PLAN:  GASTROINTESTINAL A:   Perforated duodenal ulcer - s/p surgical repair 02-23-16  (Dr. Donne Hazel).  C.difficile colitis. GI bleed - ? Resolved as of 02-23-2023. GI prophylaxis. Nutrition. P:   Post op care per CCS. Abx per ID section. SUP: Famotidine, pantoprazole gtt. NPO.  PULMONARY A: VDRF - s/p ex lap for repair of perforated duodenal ulcer. P:   Full vent support. Wean as able.  VAP prevention measures.  Hold SBT until no further plans to return to OR.  CXR in AM.  CARDIOVASCULAR A:  Hx A.fib (on warfarin), CHF, CAD, MI, ICM (EF 23%), HLD. P:  Cardiology following. Would start heparin drip in AM if OK with surgery. Hold outpatient warfarin, pravachol, losartan, carvedilol, digoxin, fenofibrate, furosemide, toprol-xl, nitro.   RENAL A:   Hx CKD. Pseudohypocalcemia - corrects to 9.64. P:   NS @ 100. Assess ionized calcium. BMP in AM.  HEMATOLOGIC A:   Acute blood loss anemia - had GI bleed on admit, appears to be resolved now. Coagulopathy - received vitamin K and FFP. VTE Prophylaxis. P:  Transfuse for Hgb < 7 (though consider < 8 if becomes symptomatic). Would start heparin in AM in lieu of outpatient warfarin if OK with surgery. SCD's. CBC in AM.  INFECTIOUS A:   Peritonitis. C.difficile colitis P:  Abx as above (Vanc / flagyl).  Follow cultures as above.  ENDOCRINE A:   No acute issues.  P:   SSI.  NEUROLOGIC A:   Acute metabolic encephalopathy. Hx Depression. P:   Sedation:  Propofol gtt / Fentanyl PRN. RASS goal: 0 to -1. Daily WUA. Hold outpatient sertraline, ativan, roxicet,    Family updated: None.  Interdisciplinary Family Meeting v Palliative Care Meeting:  Due by: 03/12.  CC time: 35 minutes.   Montey Hora, [facility] Pager: [phone]  or [phone] 02/22/2016, 9:34 PM   ATTENDING NOTE: I have personally reviewed patient's available data, including medical history, events of note, physical examination and test results as part of my evaluation. I have  discussed with resident/NP Montey Hora  and other care team providers such as pharmacist, RN and RRT & co-ordinated with consultants.  70 y.o. year old male, admitted for abd pain, nausea. Was hypotensive and anemic at ER. INR was 8. Pt with several co-morbidities including : c diff colitis, CHF with EF 25%, CKD, Afib. CT scan of abd showed possible perforated duodenal ulcer. Fluid surrounding pneumoperitoneum. No obvious abscess. Chest CT scan did not reveal any filling defects, small bilateral effusions, atelectasis.  Patient went to surgery on 02/22/2016. He had a graham patch to perforated duodenal ulcer. He was kept intubated. Transferred to the ICU. PCCM consulted.   On exam, sedated. 125/66, pulse 100. O2 saturation 100% on the ventilator with 50% FiO2, rate 15, tidal volume 590, PEEP of 5. ET tube in place. Comfortable. Right IJ central line. Diminished breath sounds bilaterally. Crackles at the bases. Surgical dressing on the abdomen. Grade 1 edema. Rest of physical exam as documented by Rahul. Patient is 2.5 L positive since admission.   Labs reviewed. Pertinent labs include the following: Hemoglobin was 8. Hematocrit 26. White count of 8. ABG and event pH 7.39, PCO2 37, PO2 122 on 50% FiO2. Creatinine was 1.3. INR was 1.61.   Imaging reviewed personally. Chest x-ray postop showed atelectasis at the bases, beginning congestion.   Impression: S/P Graham patch 2/2 perforated duodenal ulcer. (02/22/16). Abdomen was left open. Acute hypoxemic respiratory failure secondary to unable to protect airway, beginning pulmonary edema. Chronic CHF, EF 25%. CAD. A. Fib. Recent upper GI bleed, stable. C. difficile colitis. CKD (recent IV contrast)  Plan : -Admit to ICU. -Keep intubated. Keep on the ventilator. Oxygenation is stable. Since abdomen is open, I anticipate patient will have further surgery. We'll hold off on weaning and extubating until surgery is done. Need to touch base with surgery in  the morning. -Patient is 2.5 L positive since admission. Recent IV contrast. We'll keep at saline 50 mls/hr. watch out for congestion. -May need diuresis in next 24 hours. -Suggest starting heparin drip in the morning for atrial fibrillation once okay with surgery. Need to watch out for further GI bleeding while on heparin drip. -Keep nothing by mouth -Vancomycin by mouth once able for C. difficile colitis. -Start Flagyl IV and Zosyn IV. Flagyl is for the C. difficile colitis. Need to ask pharmacy if we can give Vanco liquid per rectum. Zosyn  is for possible abdominal infection given  perforated duodenal ulcer with some fluid per CT scan. -Check lactic acid. -Check blood culture. -Continue other meds. Hold off on blood pressure meds. -DuoNeb 4 times a day       Code status : Full  The patient is critically  ill with multiple organ systems failure and requires high complexity decision making for assessment and support, frequent evaluation and titration of therapies, application of advanced monitoring technologies and extensive interpretation of multiple databases.   Critical Care Time devoted to patient care services described in this note independent of APP time is 45 minutes.   Monica Becton, MD 02/22/2016, 11:18 PM [facility] Pager [phone] After 3 pm or if no answer, call [phone]

## 2016-02-22 NOTE — Progress Notes (Addendum)
PT Cancellation Note  Patient Details Name: [name] MRN: [id number] DOB: 01/02/1946   Cancelled Treatment:    Reason Eval/Treat Not Completed: Medical issues which prohibited therapy. Pt with chest pain.  Will check back as schedule permits. Addendum: Checked back later in the day and pt still having chest pain. Will check on pt tomorrow.   Korianna Washer LUBECK 02/22/2016, 9:02 AM

## 2016-02-22 NOTE — Progress Notes (Signed)
eLink Physician-Brief Progress Note [name] DOB: [birth year] MRN: [id number]   Date of Service  02/22/2016  HPI/Events of Note  Patient transferred from PACU postoperative repair of ulcer perforation.  Currently intubated and sedated. Camera check shows patient comfortable and sedated. Nurse at bedside.  eICU Interventions  1. Vent bundle 2. ABG 3. Stat portable chest x-ray 4. Continuing propofol IV for sedation 5. Triglycerides daily 6. Fentanyl IV when necessary     Intervention Category Evaluation Type: New Patient Evaluation  Tera Partridge 02/22/2016, 8:55 PM

## 2016-02-22 NOTE — Transfer of Care (Signed)
Immediate Anesthesia Transfer of Care Note  Patient: [name]  Procedure(s) Performed: Procedure(s): EXPLORATORY LAPAROTOMY WITH  PATCH OF DUODENAL ULCER (N/A)  Patient Location: SICU  Anesthesia Type:General  Level of Consciousness: sedated and Patient remains intubated per anesthesia plan  Airway & Oxygen Therapy: Patient remains intubated per anesthesia plan and Patient placed on Ventilator (see vital sign flow sheet for setting)  Post-op Assessment: Report given to RN and Post -op Vital signs reviewed and stable  Post vital signs: Reviewed and stable  Last Vitals:  Filed Vitals:   02/22/16 0800 02/22/16 1158  BP: 95/60 112/55  Pulse: 56 102  Temp:    Resp:      Complications: No apparent anesthesia complications

## 2016-02-22 NOTE — Anesthesia Postprocedure Evaluation (Signed)
Anesthesia Post Note  Patient: DALEN LARGENT  Procedure(s) Performed: Procedure(s) (LRB): EXPLORATORY LAPAROTOMY WITH  PATCH OF DUODENAL ULCER (N/A)  Patient location during evaluation: SICU Anesthesia Type: General Level of consciousness: sedated Pain management: pain level controlled Vital Signs Assessment: post-procedure vital signs reviewed and stable Respiratory status: patient remains intubated per anesthesia plan Cardiovascular status: stable Anesthetic complications: no Comments: Remains intubated per plan with upper abdominal incision in patient with afib with elevated ventricular rate, bilateral crackles on exam preoperatively with oxygen requirement. No alteration was made to ICD or pacemaker preoperatively. Magnet removed from right upper chest generator location of AICD as instructed by [employer] rep.    Last Vitals:  Filed Vitals:   02/22/16 2030 02/22/16 2043  BP: 125/66 98/58  Pulse: 112   Temp:  37.3 C  Resp: 15     Last Pain:  Filed Vitals:   02/22/16 2057  PainSc: 5                  Zenaida Deed

## 2016-02-22 NOTE — Progress Notes (Signed)
ANTICOAGULATION CONSULT NOTE - Initial Consult  Pharmacy Consult for warfarin Indication: atrial fibrillation  Allergies  Allergen Reactions  . Keflex [Cephalexin] Shortness Of Breath and Other (See Comments)    dizziness    Patient Measurements: Height: 5\' 10"  (177.8 cm) Weight: (!) 311 lb 9.6 oz (141.341 kg) IBW/kg (Calculated) : 73  Vital Signs: Temp: 97.8 F (36.6 C) (03/06 0651) Temp Source: Oral (03/06 0651) BP: 112/55 mmHg (03/06 1158) Pulse Rate: 102 (03/06 1158)  Labs:  Recent Labs  02/19/16 1549  02/19/16 1645 02/20/16 0208 02/21/16 0705 02/22/16 0932  HGB  --   < > 8.2* 8.5* 8.1*  --   HCT  --   --  26.3* 27.6* 26.1*  --   PLT  --   --  230 266 228  --   LABPROT 18.0*  --   --  17.9* 17.8*  --   INR 1.48  --   --  1.46 1.46  --   CREATININE  --   --   --  1.32* 1.26* 1.30*  TROPONINI  --   --   --   --   --  <0.03  < > = values in this interval not displayed.  Estimated Creatinine Clearance: 77.2 mL/min (by C-G formula based on Cr of 1.3).   Medical History: Past Medical History  Diagnosis Date  . Coronary artery disease   . MI (myocardial infarction) ([place]) 2000    ANTERIOR, s/p PCI  . Ischemic cardiomyopathy     EF 23%  . Persistent atrial fibrillation (HCC)     on coumadin  . Chronic renal insufficiency   . Dyslipidemia   . Depression   . ED (erectile dysfunction)   . Other primary cardiomyopathies   . Presence of permanent cardiac pacemaker   . AICD (automatic cardioverter/defibrillator) present   . Dysrhythmia     atrial fibrillation  . CHF (congestive heart failure) ([place])   . Shortness of breath dyspnea     on exertion    Medications:  Prescriptions prior to admission  Medication Sig Dispense Refill Last Dose  . Ascorbic Acid (VITAMIN C) 1000 MG tablet Take 500 mg by mouth daily.   02/17/2016 at Unknown time  . carvedilol (COREG) 25 MG tablet Take one-half tablet by  mouth twice a day with  meals (Patient taking differently: Take  6.25 mg by mouth daily. ) 90 tablet 3 02/17/2016 at Unknown time  . DIGOX 125 MCG tablet Take one-half tablet by  mouth daily 45 tablet 3 02/17/2016 at Unknown time  . eplerenone (INSPRA) 25 MG tablet Take 1 tablet by mouth  daily (Patient taking differently: Take 1 tablet by mouth  daily at 12 noon) 90 tablet 1 02/17/2016 at Unknown time  . fenofibrate 160 MG tablet Take 1 tablet by mouth  daily 90 tablet 2 02/17/2016 at Unknown time  . furosemide (LASIX) 40 MG tablet Take 0.5 tablets (20 mg total) by mouth daily. 45 tablet 3 02/17/2016 at Unknown time  . losartan (COZAAR) 25 MG tablet Take 1 tablet (25 mg total) by mouth daily. 90 tablet 3 02/17/2016 at Unknown time  . Methen-Hyosc-Meth Blue-Na Phos (UROGESIC-BLUE) 81.6 MG TABS Take 1 tablet 2-4 x/day as needed for urinary spasm or  burning 60 tablet 3 02/17/2016 at Unknown time  . metoprolol succinate (TOPROL-XL) 25 MG 24 hr tablet TAKE 1 TABLET BY MOUTH DAILY. TAKE WITH OR IMMEDIATELY FOLLOWING A MEAL 30 tablet 5 02/17/2016  . metoprolol succinate (TOPROL-XL)  25 MG 24 hr tablet Take 12.5 mg by mouth daily.   02/17/2016 at [place]  . Multiple Vitamin (MULTIVITAMIN WITH MINERALS) TABS tablet Take 1 tablet by mouth daily.   Past Month at Unknown time  . nitroGLYCERIN (NITROSTAT) 0.4 MG SL tablet Place 1 tablet (0.4 mg total) under the tongue every 5 (five) minutes as needed. 25 tablet 7 Past Month at Unknown time  . potassium chloride SA (K-DUR,KLOR-CON) 20 MEQ tablet Take one-half tablet by  mouth daily 45 tablet 6 02/17/2016 at Unknown time  . pravastatin (PRAVACHOL) 40 MG tablet Take 1 tablet by mouth  daily (Patient taking differently: Take 1 tablet by mouth  daily in evening) 90 tablet 1 02/17/2016 at Unknown time  . sertraline (ZOLOFT) 100 MG tablet Take 100 mg by mouth daily.     02/17/2016 at Unknown time  . warfarin (COUMADIN) 5 MG tablet Take 2.5-5 mg by mouth daily at 6 PM. 5mg  daily except 2.5 mg on Monday, Wednesday, Friday   02/17/2016  . cephALEXin (KEFLEX) 500 MG  capsule Take 1 capsule (500 mg total) by mouth 2 (two) times daily. (Patient not taking: Reported on 01/31/2016) 10 capsule 0 Completed Course at 01/30/16  . folic acid (FOLVITE) Q000111Q MCG tablet Take 800 mcg by mouth daily.    01/31/2016 at Unknown time  . LORazepam (ATIVAN) 1 MG tablet Take 1 mg by mouth every 8 (eight) hours as needed for anxiety.    Past Month at Unknown time  . oxyCODONE-acetaminophen (ROXICET) 5-325 MG tablet Take 1 tablet by mouth every 4 (four) hours as needed for severe pain. (Patient not taking: Reported on 01/31/2016) 30 tablet 0 Not Taking at Unknown time   Scheduled:  . acidophilus  2 capsule Oral Daily  . carvedilol  6.25 mg Oral BID WC  . digoxin  0.125 mg Intravenous Once  . digoxin  0.125 mg Oral Daily  . enoxaparin (LOVENOX) injection  40 mg Subcutaneous Q24H  . feeding supplement  1 Container Oral TID BM  . folic acid  [id number] mcg Oral Daily  . potassium chloride SA  20 mEq Oral Daily  . pravastatin  40 mg Oral q1800  . sertraline  100 mg Oral Daily  . sodium chloride flush  3 mL Intravenous Q12H  . vancomycin  125 mg Oral QID  . vitamin C  500 mg Oral Daily  . warfarin  5 mg Oral ONCE-1800  . Warfarin - Pharmacist Dosing Inpatient   Does not apply q1800    Assessment: 44 yoM on warfarin PTA for AFib. Pt came in with elevated INR to 8.68 and warfarin has been held since admission d/t GIB. Pt received 10 mg IV vit K on 3/2 Restarting warfarin today and Lovenox ppx dose per MD until INR therapeutic. PO intake is reduced currently.  Home warfarin dose: 5mg  daily except 2.5 mg on MWF (last dose PTA on 3/1)   Goal of Therapy:  INR 2-3 Monitor platelets by anticoagulation protocol: Yes   Plan:  Give warfarin 5 mg po x 1 Check baseline INR Monitor daily INR, CBC, clinical course, s/sx of bleed, PO intake, DDI  Thank you for allowing [place] to participate in this patients care. Jens Som, PharmD Pager: [phone] 02/22/2016,2:18 PM

## 2016-02-22 NOTE — Progress Notes (Signed)
The patient remained stable but has not gone down to OR yet.  Dr. Rolm Bookbinder and I have discussed his care and we have recommended that Dr. Donne Hazel assume his care since he is on call tonight.  I discussed this with the patient, his wife, and his son.  They're perfectly agreeable to this.  Dr. Donne Hazel is coming in now to take care of his surgery.  The operating room is also aware.     I will assume his care for postop needs tomorrow morning.  Edsel Petrin. Dalbert Batman, M.D., [facility] General and Minimally invasive Surgery Breast and Colorectal Surgery

## 2016-02-22 NOTE — Anesthesia Procedure Notes (Signed)
Central Venous Catheter Insertion Performed by: anesthesiologist 02/22/2016 6:05 PM Patient location: Pre-op. Preanesthetic checklist: patient identified, IV checked, site marked, risks and benefits discussed, surgical consent, monitors and equipment checked, pre-op evaluation, timeout performed and anesthesia consent Position: Trendelenburg Lidocaine 1% used for infiltration Landmarks identified and Seldinger technique used Catheter size: 8 Fr Central line was placed.Double lumen Procedure performed using ultrasound guided technique. Attempts: 1 Following insertion, dressing applied, line sutured and Biopatch. Post procedure assessment: blood return through all ports. Patient tolerated the procedure well with no immediate complications.

## 2016-02-22 NOTE — Progress Notes (Signed)
Patient ID: [name], male   DOB: 1946-05-07, 70 y.o.   MRN: [id number] Have seen and evaluated patient, discussed with dr Dalbert Batman.  Ct reviewed.  Plan for elap and likely repair of perforated du.  I think he will likely end up in stepdown at least overnight.

## 2016-02-22 NOTE — Anesthesia Preprocedure Evaluation (Addendum)
Anesthesia Evaluation  Patient identified by MRN, date of birth, ID band Patient awake    Reviewed: Allergy & Precautions, NPO status , Patient's Chart, lab work & pertinent test results, reviewed documented beta blocker date and time   Airway Mallampati: II  TM Distance: >3 FB Neck ROM: Full    Dental  (+) Teeth Intact   Pulmonary sleep apnea , former smoker,    breath sounds clear to auscultation       Cardiovascular hypertension, Pt. on medications and Pt. on home beta blockers + CAD, + Past MI and +CHF  + dysrhythmias + pacemaker + Cardiac Defibrillator  Rhythm:Regular Rate:Tachycardia  Intermittently paced.    Neuro/Psych PSYCHIATRIC DISORDERS Depression negative neurological ROS     GI/Hepatic negative GI ROS, Neg liver ROS,   Endo/Other  negative endocrine ROS  Renal/GU Renal InsufficiencyRenal disease  negative genitourinary   Musculoskeletal negative musculoskeletal ROS (+)   Abdominal   Peds negative pediatric ROS (+)  Hematology   Anesthesia Other Findings   Reproductive/Obstetrics negative OB ROS                            Lab Results  Component Value Date   WBC 7.8 02/21/2016   HGB 8.1* 02/21/2016   HCT 26.1* 02/21/2016   MCV 81.8 02/21/2016   PLT 228 02/21/2016   Lab Results  Component Value Date   CREATININE 1.30* 02/22/2016   BUN 14 02/22/2016   NA 140 02/22/2016   K 4.1 02/22/2016   CL 111 02/22/2016   CO2 21* 02/22/2016   Lab Results  Component Value Date   INR 1.46 02/21/2016   INR 1.46 02/20/2016   INR 1.48 02/19/2016   02/2016 EKG: V paced.   12/2015 Echo - LV is severely dilated. Wall thickness was normal. Systolic function was severely reduced. The estimated ejection fraction was in the range of 20% to 25%. Dyskinesis of the apical myocardium. Akinesis and scarring of the entireanteroseptal, anterior, and anterolateral myocardium. Doppler  parameters are consistent with restrictive physiology, indicative of decreased left ventricular diastolic compliance and/or increased left atrial pressure. No evidence of thrombus. Acoustic contrast opacification revealed no evidence ofthrombus. Acoustic contrast opacification revealed no evidence ofthrombus. - Ventricular septum: Septal motion showed paradox. These changes are consistent with a left bundle branch block. - Mitral valve: Calcified annulus. There was mild to moderate regurgitation. - Left atrium: The atrium was moderately dilated. - Right atrium: The atrium was mildly dilated. - Atrial septum: No defect or patent foramen ovale was identified. - Pulmonary arteries: PA peak pressure: 32 mm Hg (S).    Anesthesia Physical Anesthesia Plan  ASA: IV and emergent  Anesthesia Plan: General   Post-op Pain Management:    Induction: Intravenous  Airway Management Planned: Oral ETT  Additional Equipment: Arterial line and CVP  Intra-op Plan:   Post-operative Plan: Possible Post-op intubation/ventilation  Informed Consent: I have reviewed the patients History and Physical, chart, labs and discussed the procedure including the risks, benefits and alternatives for the proposed anesthesia with the patient or authorized representative who has indicated his/her understanding and acceptance.   Dental advisory given  Plan Discussed with: CRNA  Anesthesia Plan Comments:         Anesthesia Quick Evaluation

## 2016-02-22 NOTE — Progress Notes (Addendum)
[place] TEAM 1 - Stepdown/ICU TEAM PROGRESS NOTE  [name] [id number] DOB: July 07, 1946 DOA: 02/18/2016 PCP: [name], MD  Admit HPI / Brief Narrative: 70 y.o. male who presented w/ complaints of hypotension and dizziness. Patient stated he had various somatic complaints ever since he had a TURP on 01/25/2016, to include intermittent abdominal cramping, bloating, constipation. Over the 24 hours prior to his admit he experienced extreme fatigue, dizziness upon standing, and shortness of breath on exertion. Patient had been taking ciprofloxacin since his surgery. Patient stated his blood pressure medications were changed 2 weeks prior due to hypotension. Patient took his BP at home and stated systolic was in the Q000111Q.   HPI/Subjective:  DENIES ANY RECURRENT BLEEDING,  rapid response this morning for chest pain epigastric pain right upper quadrant pain, since 7:50 this morning.    Assessment/Plan: Chest pain/abdominal pain Unclear etiology, need to rule out pulmonary embolism in the setting of being off anticoagulation in the setting of GI bleeding Abnormal d-dimer and therefore CT PE protocol and CT abdomen pelvis ordered Troponin negative, cardiology consultation, continue to monitor serial cardiac enzymes to ensure that they stay negative Lipase slightly elevated, CT abdomen  Shows perforated duodenal ulcer, gen surgery notifed    GI bleed - FOBT positive / melena  colonoscopy approximate 4 years ago which showed few colonic polyps - likely due spontaneous bleeding in setting of signif coagulopathy - Hgb appears to be stabilizing coincident w/ correction of coagulopathy - now attributed to perforated duodenal ulcer , will keep NPO Start protonix gtt, general  surgery consult , tx patient to step down     Acute blood loss anemia  Baseline Hgb 14 - Hgb 9.8> 8.1 today - appears hemodynamically stable at present - Hgb stable/improving at this time -   transfuse 2 units of PRBC  stat    Coagulopathy due to supratherapeutic warfarin  Due to warfarin + cipro - reversed - follow INR trend -   Restart Coumadin and bridge with Lovenox for DVT prophylaxis   Hypotension Resolved w/ volume expansion continue to hold ARB  C diff colitis  Due to prolonged course of cipro - oral vanc for 21 days - avoid other abx as able   Systolic CHF - Ischemic - s/p CRT D EF 20% - well compensated at present after coming in dehydrated - follow Is/Os and weights  Repeat 2-D echo   Afib  CHA2DS2-VASc 4 - intolerant of amio - QT too long for Tikosyn - rate presently controlled - followed by Dr. [place] - rate reasonably controlled at present - need to hold coumadin  Pending surgery recommendations  CAD Remote anterior MI - keep Hgb 9.0  Abnormal UA Avoid abx if able - culture unrevealing thus far   CKD stage 3 Baseline crt 1.7 - crt better than baseline presently   Code Status: FULL Family Communication: no family present at time of Disposition Plan: tx to stepdown,gen surgery consult   Consultants: none  Procedures: none  Antibiotics: Oral Vanc 3/2 >  DVT prophylaxis: SCDs  Objective: Blood pressure 95/60, pulse 56, temperature 97.8 F (36.6 C), temperature source Oral, resp. rate 16, height 5\' 10"  (1.778 m), weight 141.341 kg (311 lb 9.6 oz), SpO2 100 %.  Intake/Output Summary (Last 24 hours) at 02/22/16 1113 Last data filed at 02/22/16 0900  Gross per 24 hour  Intake    924 ml  Output    450 ml  Net    474 ml  Exam: General: No acute respiratory distress - alert  Lungs: Clear to auscultation bilaterally without wheezes  Cardiovascular: Regular rate without murmur gallop or rub  Abdomen: Mildly tender diffusely, nondistended, soft, bowel sounds positive, no rebound Extremities: No significant cyanosis, clubbing, edema bilateral lower extremities  Data Reviewed:  Basic Metabolic Panel:  Recent Labs Lab 02/18/16 0940 02/19/16 0007 02/20/16 0208  02/21/16 0705 02/22/16 0932  NA 144 146* 142 143 140  K 4.7 4.3 3.9 4.1 4.1  CL 112* 115* 112* 109 111  CO2 23 21* 24 25 21*  GLUCOSE 109* 98 115* 95 130*  BUN 58* 47* 20 11 14   CREATININE 1.71* 1.41* 1.32* 1.26* 1.30*  CALCIUM 8.7* 8.6* 8.8* 9.0 8.6*    CBC:  Recent Labs Lab 02/18/16 0940 02/19/16 1645 02/20/16 0208 02/21/16 0705  WBC 9.6 8.2 9.5 7.8  NEUTROABS 6.9  --   --   --   HGB 9.8* 8.2* 8.5* 8.1*  HCT 29.0* 26.3* 27.6* 26.1*  MCV 80.6 81.9 82.1 81.8  PLT 251 230 266 228    Liver Function Tests:  Recent Labs Lab 02/18/16 0940 02/20/16 0208 02/21/16 0705 02/22/16 0932  AST 16 17 16 17   ALT 10* 10* 10* 11*  ALKPHOS 23* 23* 24* 30*  BILITOT 0.4 0.6 0.7 0.6  PROT 5.7* 5.6* 5.3* 5.3*  ALBUMIN 3.0* 2.8* 2.7* 2.7*    Recent Labs Lab 02/18/16 0940 02/22/16 0932  LIPASE 54* 87*   Coags:  Recent Labs Lab 02/19/16 0006 02/19/16 0904 02/19/16 1549 02/20/16 0208 02/21/16 0705  INR 1.68* 1.54* 1.48 1.46 1.46    Recent Results (from the past 240 hour(s))  Urine culture     Status: None   Collection Time: 02/18/16  9:35 AM  Result Value Ref Range Status   Specimen Description URINE, CLEAN CATCH  Final   Special Requests NONE  Final   Culture NO GROWTH 1 DAY  Final   Report Status 02/19/2016 FINAL  Final  MRSA PCR Screening     Status: None   Collection Time: 02/18/16  8:41 PM  Result Value Ref Range Status   MRSA by PCR NEGATIVE NEGATIVE Final    Comment:        The GeneXpert MRSA Assay (FDA approved for NASAL specimens only), is one component of a comprehensive MRSA colonization surveillance program. It is not intended to diagnose MRSA infection nor to guide or monitor treatment for MRSA infections.   C difficile quick scan w PCR reflex     Status: Abnormal   Collection Time: 02/18/16 11:00 PM  Result Value Ref Range Status   C Diff antigen POSITIVE (A) NEGATIVE Final   C Diff toxin POSITIVE (A) NEGATIVE Final   C Diff interpretation  Positive for toxigenic C. difficile  Final    Comment: CRITICAL RESULT CALLED TO, READ BACK BY AND VERIFIED WITH: AMY KORSLIEN,RN @0108  02/19/16 MKELLY      Studies:   Recent x-ray studies have been reviewed in detail by the Attending Physician  Scheduled Meds:  Scheduled Meds: . acidophilus  2 capsule Oral Daily  . carvedilol  6.25 mg Oral BID WC  . digoxin  0.125 mg Intravenous Once  . digoxin  0.125 mg Oral Daily  . enoxaparin (LOVENOX) injection  40 mg Subcutaneous Q24H  . feeding supplement  1 Container Oral TID BM  . folic acid  [id number] mcg Oral Daily  . potassium chloride SA  20 mEq Oral Daily  . pravastatin  40  mg Oral q1800  . sertraline  100 mg Oral Daily  . sodium chloride flush  3 mL Intravenous Q12H  . vancomycin  125 mg Oral QID  . vitamin C  500 mg Oral Daily    Time spent on care of this patient: 45 mins   Reyne Dumas , MD   [facility] Office  [phone] Pager - Text Page per Shea Evans as per below:  On-Call/Text Page:      [email or web address]      password [email or web address]  If 7PM-7AM, please contact night-coverage [email or web address] Password [email or web address] 02/22/2016, 11:13 AM   LOS: 4 days

## 2016-02-22 NOTE — Progress Notes (Signed)
Spoke to [place] per Dr. Smith  and requested that she come in to interrogate device and make needed programming changes.

## 2016-02-22 NOTE — Significant Event (Signed)
Rapid Response Event Note  Overview: Time Called: 0801 Arrival Time: 0805 Event Type: Cardiac  Initial Focused Assessment:  Called by RN for patient with CP 8/10.  Upon my arrival to patients room, Rn at bedside.  Patient lying in bed with nasal cannula 2 LPM, a little pale, skin clammy.  Patient states pain started around 0750, came on suddenly and was like a band around his chest.  Prior to my arrival, EKG was done, 2 sl nitro were given, and BP now 82/53.  He states his pain goes across his chest and that he is having some labored breathing.  He points to the epigastric area for the location of his pain .   Interventions:  GI cocktail was given. Patient states his pain is subsided 6-7/10.  MD at bedside. Bp 105/52, HR 105, 100%, RR 13.  NS 250 cc bolus started.  Patient states his pain is coming back, sharp and bandlike, and point to his left upper abd,  now has nausea, zofran being given and 1mg  morphine to be given. Patient states pain is now 8/10.  MD and primary RN notified. BP 109/66, 110   Event Summary:  RN to call if assistance needed   at      at          [facility], Harlin Rain

## 2016-02-22 NOTE — Progress Notes (Signed)
PHARMACY - FLAGYL  70 y/o male admitted with GIB. He is s/p repair of perforated duodenal ulcer today and C diff positive on PO vancomycin. Pharmacy consulted to begin Flagyl for peritonitis and C diff. WBC are wnl, Tm is 99.2.  Flagyl 500 mg IV q8h Pharmacy signing off, please re-consult if needed  [facility], [place].D., BCPS Clinical Pharmacist Pager: [phone] 02/22/2016 10:34 PM

## 2016-02-22 NOTE — Progress Notes (Signed)
OT Cancellation Note  Patient Details Name: CHANDAN BEAULIEU MRN: [id number] DOB: Jul 15, 1946   Cancelled Treatment:    Reason Eval/Treat Not Completed: Patient at procedure or test/ unavailable. Pt being transferred off unit. Will check back as schedule allows for OT eval. Thank you for the order.   Chrys Racer , MS, OTR/L, CLT Pager: [phone]  02/22/2016, 2:12 PM

## 2016-02-22 NOTE — Progress Notes (Signed)
Report given to Izora Gala, CRNA

## 2016-02-22 NOTE — Progress Notes (Signed)
PT C/O crushing chest pain, 10/10 mid chest to left chest, EKG obtain, MD paged orders received. O@ applied at 2L,

## 2016-02-22 NOTE — Consult Note (Signed)
Reason for Consult:   Epigastric pain  Requesting Physician: [facility] Primary Cardiologist Dr Martinique  HPI:   70 y/o followed by Dr Martinique with CAD s/p remote anterior myocardial infarction in 2000 treated with an LAD PCI, patent in 2009 but with an ischemic cardiomyopathy, and ejection fraction of 23%. He is status post MDT CRT D 2009 with a generator replacement in September 2014. He also has a history of atrial fibrillation. He is intolerant of amiodarone. His QT interval is too long for Tikosyn. He previously had attempted to increase his carvedilol dose but he developed significant hypotension. He is on chronic Coumadin anticoagulation-CHADs VASc=4 (age, CHF, HTN, vascular disease).  He recently had a TUR- 01/25/16. He apparently had been on post procedure antibiotics (Cipro) for several weeks. He presented [facility] ED at [facility]/18/16 with dizziness and hypotension. His INR was greater than his Hgb- INR 8.6- Hgb 8.2 with positive stools. He was given Vit K in the ED and transfused. He was also found to have C. Diff colitis and this was treated. He was gradually improving till yesterday when he complained of epigastric pain- relieved with ant acid. This am rapid response was called for chest pain unrelieved with antacids. The describes epigastric discomfort, worse with inspiration (he is taking only shallow breaths).  NO improvement with changing position. EKG shows paced rythm, initial Troponin negative- D.Dimer elevated at 1.6.   PMHx:  Past Medical History  Diagnosis Date  . Coronary artery disease   . MI (myocardial infarction) ([name]) 2000    ANTERIOR, s/p PCI  . Ischemic cardiomyopathy     EF 23%  . Persistent atrial fibrillation (HCC)     on coumadin  . Chronic renal insufficiency   . Dyslipidemia   . Depression   . ED (erectile dysfunction)   . Other primary cardiomyopathies   . Presence of permanent cardiac pacemaker   . AICD (automatic cardioverter/defibrillator)  present   . Dysrhythmia     atrial fibrillation  . CHF (congestive heart failure) ([name])   . Shortness of breath dyspnea     on exertion    Past Surgical History  Procedure Laterality Date  . Cardiac catheterization  02/05/2008    MODERATE TO SEVERE LEFT VENTRICULAR  DYSFUNCTION WITH EF 25-30%  . Coronary stent placement      LAD  . Transthoracic echocardiogram  12/2010    EF 30-35%  . Cardiac defibrillator placement  2009    MDT BI-VENTRICULAR ICD by Dr Caryl Comes  . Wrist fracture surgery    . Breast lumpectomy    . Implantable cardioverter defibrillator generator change  2014  . Biv icd genertaor change out N/A 09/16/2013    Procedure: BIV ICD GENERTAOR CHANGE OUT;  Surgeon: [name] Sprang, MD;  Location: [facility] CATH LAB;  Service: Cardiovascular;  Laterality: N/A;  . Insert / replace / remove pacemaker      ICD  . Coronary angioplasty  2000  . Cystoscopy N/A 01/25/2016    Procedure: CYSTOSCOPY;  Surgeon: Carolan Clines, MD;  Location: [facility] ORS;  Service: Urology;  Laterality: N/A;  . Transurethral resection of prostate N/A 01/25/2016    Procedure: TRANSURETHRAL RESECTION OF THE PROSTATE (TURP);  Surgeon: Carolan Clines, MD;  Location: [facility] ORS;  Service: Urology;  Laterality: N/A;    SOCHx:  reports that he quit smoking about 17 years ago. His smoking use included Cigarettes. He does not have any smokeless tobacco history on  file. He reports that he does not drink alcohol or use illicit drugs.  FAMHx: Family History  Problem Relation Age of Onset  . Breast cancer Mother   . Depression Mother   . Heart attack Father   . Breast cancer Sister     ALLERGIES: Allergies  Allergen Reactions  . Keflex [Cephalexin] Shortness Of Breath and Other (See Comments)    dizziness    ROS: Review of Systems: General: negative for chills, fever, night sweats or weight changes.  Cardiovascular: negative for chest pain, dyspnea on exertion, edema, orthopnea, palpitations, paroxysmal nocturnal  dyspnea or shortness of breath HEENT: negative for any visual disturbances, blindness, glaucoma Dermatological: negative for rash Respiratory: negative for cough, hemoptysis, or wheezing Urologic: negative for hematuria or dysuria Abdominal: negative for nausea, vomiting, diarrhea, bright red blood per rectum, melena, or hematemesis Neurologic: negative for visual changes, syncope, or dizziness Musculoskeletal: negative for back pain, joint pain, or swelling Psych: cooperative and appropriate All other systems reviewed and are otherwise negative except as noted above.   HOME MEDICATIONS: Prior to Admission medications   Medication Sig Start Date End Date Taking? Authorizing Provider  Ascorbic Acid (VITAMIN C) 1000 MG tablet Take 500 mg by mouth daily.   Yes Historical Provider, MD  carvedilol (COREG) 25 MG tablet Take one-half tablet by  mouth twice a day with  meals Patient taking differently: Take 6.25 mg by mouth daily.  05/25/15  Yes Peter M Martinique, MD  Surprise 125 MCG tablet Take one-half tablet by  mouth daily 07/14/15  Yes [name] Sprang, MD  eplerenone (INSPRA) 25 MG tablet Take 1 tablet by mouth  daily Patient taking differently: Take 1 tablet by mouth  daily at 12 noon 10/13/15  Yes Peter M Martinique, MD  fenofibrate 160 MG tablet Take 1 tablet by mouth  daily 11/16/15  Yes Peter M Martinique, MD  furosemide (LASIX) 40 MG tablet Take 0.5 tablets (20 mg total) by mouth daily. 02/08/16  Yes Peter M Martinique, MD  losartan (COZAAR) 25 MG tablet Take 1 tablet (25 mg total) by mouth daily. 05/25/15  Yes Peter M Martinique, MD  Methen-Hyosc-Meth Blue-Na Phos (UROGESIC-BLUE) 81.6 MG TABS Take 1 tablet 2-4 x/day as needed for urinary spasm or  burning 01/26/16  Yes Carolan Clines, MD  metoprolol succinate (TOPROL-XL) 25 MG 24 hr tablet TAKE 1 TABLET BY MOUTH DAILY. TAKE WITH OR IMMEDIATELY FOLLOWING A MEAL 07/10/15  Yes [name] Sprang, MD  metoprolol succinate (TOPROL-XL) 25 MG 24 hr tablet Take 12.5 mg by  mouth daily.   Yes Historical Provider, MD  Multiple Vitamin (MULTIVITAMIN WITH MINERALS) TABS tablet Take 1 tablet by mouth daily.   Yes Historical Provider, MD  nitroGLYCERIN (NITROSTAT) 0.4 MG SL tablet Place 1 tablet (0.4 mg total) under the tongue every 5 (five) minutes as needed. 10/28/15  Yes Peter M Martinique, MD  potassium chloride SA (K-DUR,KLOR-CON) 20 MEQ tablet Take one-half tablet by  mouth daily 07/14/15  Yes Peter M Martinique, MD  pravastatin (PRAVACHOL) 40 MG tablet Take 1 tablet by mouth  daily Patient taking differently: Take 1 tablet by mouth  daily in evening 12/15/15  Yes Peter M Martinique, MD  sertraline (ZOLOFT) 100 MG tablet Take 100 mg by mouth daily.     Yes Historical Provider, MD  warfarin (COUMADIN) 5 MG tablet Take 2.5-5 mg by mouth daily at 6 PM. 5mg  daily except 2.5 mg on Monday, Wednesday, Friday   Yes Historical Provider, MD  cephALEXin (  KEFLEX) 500 MG capsule Take 1 capsule (500 mg total) by mouth 2 (two) times daily. Patient not taking: Reported on 01/31/2016 01/26/16   Carolan Clines, MD  folic acid (FOLVITE) Q000111Q MCG tablet Take 800 mcg by mouth daily.     Historical Provider, MD  LORazepam (ATIVAN) 1 MG tablet Take 1 mg by mouth every 8 (eight) hours as needed for anxiety.     Historical Provider, MD  oxyCODONE-acetaminophen (ROXICET) 5-325 MG tablet Take 1 tablet by mouth every 4 (four) hours as needed for severe pain. Patient not taking: Reported on 01/31/2016 01/26/16   Carolan Clines, MD    HOSPITAL MEDICATIONS: I have reviewed the patient's current medications.  VITALS: Blood pressure 95/60, pulse 56, temperature 97.8 F (36.6 C), temperature source Oral, resp. rate 16, height 5\' 10"  (1.778 m), weight 311 lb 9.6 oz (141.341 kg), SpO2 100 %.  PHYSICAL EXAM: General appearance: alert, cooperative, mild distress, mildly obese and pale Neck: no JVD Lungs: clear to auscultation bilaterally and unable to take a deep breath Heart: regular rate and  rhythm Abdomen: appears slightly distended, BS quiet, diffusely tender Extremities: no edema Pulses: diminnished Skin: pale, cool, dry Neurologic: Grossly normal  LABS: Results for orders placed or performed during the hospital encounter of 02/18/16 (from the past 24 hour(s))  Troponin I     Status: None   Collection Time: 02/22/16  9:32 AM  Result Value Ref Range   Troponin I <0.03 <0.031 ng/mL  D-dimer, quantitative (not at [facility])     Status: Abnormal   Collection Time: 02/22/16  9:32 AM  Result Value Ref Range   D-Dimer, Quant 1.60 (H) 0.00 - 0.50 ug/mL-FEU  Comprehensive metabolic panel     Status: Abnormal   Collection Time: 02/22/16  9:32 AM  Result Value Ref Range   Sodium 140 135 - 145 mmol/L   Potassium 4.1 3.5 - 5.1 mmol/L   Chloride 111 101 - 111 mmol/L   CO2 21 (L) 22 - 32 mmol/L   Glucose, Bld 130 (H) 65 - 99 mg/dL   BUN 14 6 - 20 mg/dL   Creatinine, Ser 1.30 (H) 0.61 - 1.24 mg/dL   Calcium 8.6 (L) 8.9 - 10.3 mg/dL   Total Protein 5.3 (L) 6.5 - 8.1 g/dL   Albumin 2.7 (L) 3.5 - 5.0 g/dL   AST 17 15 - 41 U/L   ALT 11 (L) 17 - 63 U/L   Alkaline Phosphatase 30 (L) 38 - 126 U/L   Total Bilirubin 0.6 0.3 - 1.2 mg/dL   GFR calc non Af Amer 55 (L) >60 mL/min   GFR calc Af Amer >60 >60 mL/min   Anion gap 8 5 - 15  Lipase, blood     Status: Abnormal   Collection Time: 02/22/16  9:32 AM  Result Value Ref Range   Lipase 87 (H) 11 - 51 U/L    EKG: Paced  IMAGING: Dg Chest Port 1 View  02/22/2016  CLINICAL DATA:  Onset of chest pain and shortness of breath now. EXAM: PORTABLE CHEST 1 VIEW COMPARISON:  PA and lateral chest x-ray of February 18, 2016 FINDINGS: The lungs are mildly hypoinflated. The interstitial markings are increased bilaterally. The cardiac silhouette is enlarged and the pulmonary vascularity is engorged. The permanent pacemaker defibrillator is in stable position. The mediastinum is normal in width. There is no pleural effusion or pneumothorax. IMPRESSION: CHF  with mild pulmonary interstitial edema. There is no alveolar edema nor pneumonia. Electronically Signed  By: David  Martinique M.D.   On: 02/22/2016 08:24    IMPRESSION: Principal Problems:   Symptomatic anemia /GI bleed    C. difficile colitis  Active Problems:   Chronic systolic CHF (congestive heart failure) (HCC)   Biventricular implantable cardioverter-defibrillator [employer]   Supratherapeutic INR   Cardiomyopathy, ischemic-EF 20-25%   Chronic a-fib (HCC)   Chronic anticoagulation-Couamdin   CAD S/P LAD PCI- 2000       BPH- s/p TURP 01/25/16 - followed by 4 weeks of Cipro      Hypotension   S/P TURP   CKD (chronic kidney disease) stage 3, GFR 30-59 ml/min   RECOMMENDATION: Cycle Troponin. Symptoms do not seem cardiac currently. He does look significantly uncomfortable. Will follow with you.   Time Spent Directly with Patient: 19 minutes  Kerin Ransom, [place] beeper 02/22/2016, 11:21 AM   I have seen, examined and evaluated the patient this PM along with Mr. Rosalyn Gess, [place].  After reviewing all the available data and chart,  I agree with his findings, examination as well as impression recommendations.  Pt with known CAD/Ischemic CM & Chronic Afib admitted for Abdominal pain & dyspnea. Noted to be anemic with elevated INR -now improved.  Noted to have C-diff colitis (likely related to Abx given post- TURP.  His HR is somewhat elevated, but is under notable distress & underlying cause is infection/C-diff + anemia.  Had significant distress this AM & we were consulted.  EKG is benign & initial Troponin is negative. Has had nausea after taking pills - if unable to take Carvedilol (& Dig) PO, will need to convert to PO to avoid Afib RVR.  Restart warfarin without bridge once Hgb is stable.   -- would transfuse for Hgb<8 & consider 1 unit with Hgb of 8.1 given presentation with "symptomatic anemia" & known Ischemic CM.  On Pravachol for CAD - can hold for now & restart  once taking PO. ICM/Chroncic CHF: Was previously on Losartan - has been on hold for hypotension -- would hold for now & consider re-starting as OP.   Leonie Man, M.D., M.S. Interventional Cardiologist   Pager # [phone] Phone # [phone] [address] [place], [place] [address]

## 2016-02-22 NOTE — Consult Note (Signed)
Reason for Consult: pneumoperitoneum  Referring Physician: Dr. Reyne Dumas   HPI: [name] is a 70 year old male with a history of CHF, CAD, MI, AICD, s/p TURP 01/25/16 admitted 02/18/16 with abdominal pain, nausea and low back pain.  He reports developing symptoms right after his turp.  He was placed on antibiotics, but unclear why.  He reports that last Tuesday he had worsening abdominal pain, dark stool and diarrhea.  Associated with fever and chills, poor appetite.  Denies any use of NSAIDs, alcohol use.  Takes coumadin for atrial fibrillation.  Last dose was Tuesday. Last INR 3/5 was 1.46. He was found to have c diff colitis.  CT today revealed pneumoperitoneum suspicious for a perforated peptic ulcer.  We have therefore been asked to evaluate.    Past Medical History  Diagnosis Date  . Coronary artery disease   . MI (myocardial infarction) ([place]) 2000    ANTERIOR, s/p PCI  . Ischemic cardiomyopathy     EF 23%  . Persistent atrial fibrillation ([facility])     on coumadin  . Chronic renal insufficiency   . Dyslipidemia   . Depression   . ED (erectile dysfunction)   . Other primary cardiomyopathies   . Presence of permanent cardiac pacemaker   . AICD (automatic cardioverter/defibrillator) present   . Dysrhythmia     atrial fibrillation  . CHF (congestive heart failure) ([place])   . Shortness of breath dyspnea     on exertion    Past Surgical History  Procedure Laterality Date  . Cardiac catheterization  02/05/2008    MODERATE TO SEVERE LEFT VENTRICULAR  DYSFUNCTION WITH EF 25-30%  . Coronary stent placement      LAD  . Transthoracic echocardiogram  12/2010    EF 30-35%  . Cardiac defibrillator placement  2009    MDT BI-VENTRICULAR ICD by Dr Caryl Comes  . Wrist fracture surgery    . Breast lumpectomy    . Implantable cardioverter defibrillator generator change  2014  . Biv icd genertaor change out N/A 09/16/2013    Procedure: BIV ICD GENERTAOR CHANGE OUT;  Surgeon: [name], MD;   Location: [facility] CATH LAB;  Service: Cardiovascular;  Laterality: N/A;  . Insert / replace / remove pacemaker      ICD  . Coronary angioplasty  2000  . Cystoscopy N/A 01/25/2016    Procedure: CYSTOSCOPY;  Surgeon: Carolan Clines, MD;  Location: [facility] ORS;  Service: Urology;  Laterality: N/A;  . Transurethral resection of prostate N/A 01/25/2016    Procedure: TRANSURETHRAL RESECTION OF THE PROSTATE (TURP);  Surgeon: Carolan Clines, MD;  Location: [facility] ORS;  Service: Urology;  Laterality: N/A;    Family History  Problem Relation Age of Onset  . Breast cancer Mother   . Depression Mother   . Heart attack Father   . Breast cancer Sister     Social History:  reports that he quit smoking about 17 years ago. His smoking use included Cigarettes. He does not have any smokeless tobacco history on file. He reports that he does not drink alcohol or use illicit drugs.  Allergies:  Allergies  Allergen Reactions  . Keflex [Cephalexin] Shortness Of Breath and Other (See Comments)    dizziness    Medications:  Scheduled Meds: . sodium chloride   Intravenous Once  . sodium chloride   Intravenous Once  . acidophilus  2 capsule Oral Daily  . carvedilol  6.25 mg Oral BID WC  . digoxin  0.125 mg  Intravenous Once  . digoxin  0.125 mg Oral Daily  . feeding supplement  1 Container Oral TID BM  . folic acid  5,784 mcg Oral Daily  . potassium chloride SA  20 mEq Oral Daily  . pravastatin  40 mg Oral q1800  . sertraline  100 mg Oral Daily  . sodium chloride  250 mL Intravenous Once  . sodium chloride flush  3 mL Intravenous Q12H  . vancomycin  125 mg Oral QID  . vitamin C  500 mg Oral Daily   Continuous Infusions: . sodium chloride 100 mL/hr at 02/22/16 1224   PRN Meds:.acetaminophen **OR** acetaminophen, LORazepam, morphine injection, nitroGLYCERIN, ondansetron **OR** ondansetron (ZOFRAN) IV, promethazine, simethicone, zolpidem   Results for orders placed or performed during the hospital encounter  of 02/18/16 (from the past 48 hour(s))  CBC     Status: Abnormal   Collection Time: 02/21/16  7:05 AM  Result Value Ref Range   WBC 7.8 4.0 - 10.5 K/uL   RBC 3.19 (L) 4.22 - 5.81 MIL/uL   Hemoglobin 8.1 (L) 13.0 - 17.0 g/dL   HCT 26.1 (L) 39.0 - 52.0 %   MCV 81.8 78.0 - 100.0 fL   MCH 25.4 (L) 26.0 - 34.0 pg   MCHC 31.0 30.0 - 36.0 g/dL   RDW 14.6 11.5 - 15.5 %   Platelets 228 150 - 400 K/uL  Comprehensive metabolic panel     Status: Abnormal   Collection Time: 02/21/16  7:05 AM  Result Value Ref Range   Sodium 143 135 - 145 mmol/L   Potassium 4.1 3.5 - 5.1 mmol/L   Chloride 109 101 - 111 mmol/L   CO2 25 22 - 32 mmol/L   Glucose, Bld 95 65 - 99 mg/dL   BUN 11 6 - 20 mg/dL   Creatinine, Ser 1.26 (H) 0.61 - 1.24 mg/dL   Calcium 9.0 8.9 - 10.3 mg/dL   Total Protein 5.3 (L) 6.5 - 8.1 g/dL   Albumin 2.7 (L) 3.5 - 5.0 g/dL   AST 16 15 - 41 U/L   ALT 10 (L) 17 - 63 U/L   Alkaline Phosphatase 24 (L) 38 - 126 U/L   Total Bilirubin 0.7 0.3 - 1.2 mg/dL   GFR calc non Af Amer 57 (L) >60 mL/min   GFR calc Af Amer >60 >60 mL/min    Comment: (NOTE) The eGFR has been calculated using the CKD EPI equation. This calculation has not been validated in all clinical situations. eGFR's persistently <60 mL/min signify possible Chronic Kidney Disease.    Anion gap 9 5 - 15  Protime-INR     Status: Abnormal   Collection Time: 02/21/16  7:05 AM  Result Value Ref Range   Prothrombin Time 17.8 (H) 11.6 - 15.2 seconds   INR 1.46 0.00 - 1.49  Troponin I     Status: None   Collection Time: 02/22/16  9:32 AM  Result Value Ref Range   Troponin I <0.03 <0.031 ng/mL    Comment:        NO INDICATION OF MYOCARDIAL INJURY.   D-dimer, quantitative (not at [facility])     Status: Abnormal   Collection Time: 02/22/16  9:32 AM  Result Value Ref Range   D-Dimer, Quant 1.60 (H) 0.00 - 0.50 ug/mL-FEU    Comment: (NOTE) At the manufacturer cut-off of 0.50 ug/mL FEU, this assay has been documented to exclude PE  with a sensitivity and negative predictive value of 97 to 99%.  At  this time, this assay has not been approved by the FDA to exclude DVT/VTE. Results should be correlated with clinical presentation.   Comprehensive metabolic panel     Status: Abnormal   Collection Time: 02/22/16  9:32 AM  Result Value Ref Range   Sodium 140 135 - 145 mmol/L   Potassium 4.1 3.5 - 5.1 mmol/L   Chloride 111 101 - 111 mmol/L   CO2 21 (L) 22 - 32 mmol/L   Glucose, Bld 130 (H) 65 - 99 mg/dL   BUN 14 6 - 20 mg/dL   Creatinine, Ser 1.30 (H) 0.61 - 1.24 mg/dL   Calcium 8.6 (L) 8.9 - 10.3 mg/dL   Total Protein 5.3 (L) 6.5 - 8.1 g/dL   Albumin 2.7 (L) 3.5 - 5.0 g/dL   AST 17 15 - 41 U/L   ALT 11 (L) 17 - 63 U/L   Alkaline Phosphatase 30 (L) 38 - 126 U/L   Total Bilirubin 0.6 0.3 - 1.2 mg/dL   GFR calc non Af Amer 55 (L) >60 mL/min   GFR calc Af Amer >60 >60 mL/min    Comment: (NOTE) The eGFR has been calculated using the CKD EPI equation. This calculation has not been validated in all clinical situations. eGFR's persistently <60 mL/min signify possible Chronic Kidney Disease.    Anion gap 8 5 - 15  Lipase, blood     Status: Abnormal   Collection Time: 02/22/16  9:32 AM  Result Value Ref Range   Lipase 87 (H) 11 - 51 U/L  Troponin I     Status: None   Collection Time: 02/22/16  1:38 PM  Result Value Ref Range   Troponin I <0.03 <0.031 ng/mL    Comment:        NO INDICATION OF MYOCARDIAL INJURY.     Ct Angio Chest Pe W/cm &/or Wo Cm  02/22/2016  CLINICAL DATA:  Shortness of breath and chest pain. History of coronary artery disease, myocardial infarction and congestive heart failure. EXAM: CT ANGIOGRAPHY CHEST CT ABDOMEN AND PELVIS WITH CONTRAST TECHNIQUE: Multidetector CT imaging of the chest was performed using the standard protocol during bolus administration of intravenous contrast. Multiplanar CT image reconstructions and MIPs were obtained to evaluate the vascular anatomy. Multidetector CT  imaging of the abdomen and pelvis was performed using the standard protocol during bolus administration of intravenous contrast. CONTRAST:  132m OMNIPAQUE IOHEXOL 350 MG/ML SOLN COMPARISON:  Chest radiographs 02/22/2016 and 02/18/2016. Nuclear medicine pulmonary ventilation and perfusion study 01/31/2016. FINDINGS: CTA CHEST FINDINGS Mediastinum: The pulmonary arteries are well opacified with contrast. There is no evidence of acute pulmonary embolism. There is atherosclerosis of the aorta, great vessels and coronary arteries.The heart is enlarged. There is a right subclavian pacemaker with leads in the right atrium, right ventricle and coronary sinus. There is no significant pericardial effusion. There are no enlarged mediastinal, hilar or axillary lymph nodes. The thyroid gland, trachea and esophagus demonstrate no significant findings. Lungs/Pleura: There is trace pleural fluid bilaterally. No pneumothorax or pneumomediastinum.Dependent pulmonary opacities bilaterally are most consistent with atelectasis. There is no consolidation or endobronchial lesion. Musculoskeletal/Chest wall: No chest wall lesion or acute osseous findings. CT ABDOMEN and PELVIS FINDINGS Hepatobiliary: The liver is normal in density without focal abnormality. There is a small gallstone. There is no significant gallbladder wall thickening or biliary dilatation. There is no pneumobilia. Pancreas: Unremarkable. No pancreatic ductal dilatation or surrounding inflammatory changes. Spleen: Normal in size without focal abnormality. Adrenals/Urinary Tract: Both adrenal glands  appear normal. There are multiple exophytic low-density renal lesions bilaterally which are likely all cysts. Some of these are too small to optimally characterize. No evidence of enhancing renal mass, urinary tract calculus or hydronephrosis. There is bladder wall thickening with a recent trans urethral resection defect in the prostate gland. Stomach/Bowel: The distal stomach  and proximal duodenum demonstrate irregular wall thickening and surrounding inflammation. There is an extraluminal fluid collection extending posteriorly from the duodenal bulb with surrounding extraluminal fluid and air, highly suspicious for perforation of an ulcer. There is a small amount of fluid surrounding the liver and spleen as well as a moderate amount of free intraperitoneal air. Extraluminal air extends into the porta hepatis. No other bowel wall thickening or significant distention. The appendix appears normal. Vascular/Lymphatic: There are no enlarged abdominal or pelvic lymph nodes. There is diffuse atherosclerosis of the aorta, its branches and the iliac arteries. Reproductive: As above, there is a recent TUR defect within the prostate gland which is heterogeneous in density. Other: As above, pneumoperitoneum with free fluid in the upper abdomen. No generalized ascites or focal extraluminal fluid collection identified. Musculoskeletal: No acute or significant osseous findings. Review of the MIP images confirms the above findings. IMPRESSION: 1. Pneumoperitoneum with extraluminal air and fluid surrounding the distal stomach and proximal duodenum highly suspicious for a perforated peptic ulcer. General surgical evaluation recommended. 2. No evidence of bowel obstruction or abscess. 3. No evidence of acute pulmonary embolism. 4. Small bilateral pleural effusions with mild dependent atelectasis at both lung bases. 5. Moderate diffuse atherosclerosis. 6. Cholelithiasis, bilateral renal cysts and recent postsurgical changes in the prostate gland. 7. Critical Value/emergent results were called by telephone at the time of interpretation on 02/22/2016 at 3:29 pm to Dr. Reyne Dumas , who verbally acknowledged these results. Electronically Signed   By: Richardean Sale M.D.   On: 02/22/2016 15:34   Ct Abdomen Pelvis W Contrast  02/22/2016  CLINICAL DATA:  Shortness of breath and chest pain. History of coronary  artery disease, myocardial infarction and congestive heart failure. EXAM: CT ANGIOGRAPHY CHEST CT ABDOMEN AND PELVIS WITH CONTRAST TECHNIQUE: Multidetector CT imaging of the chest was performed using the standard protocol during bolus administration of intravenous contrast. Multiplanar CT image reconstructions and MIPs were obtained to evaluate the vascular anatomy. Multidetector CT imaging of the abdomen and pelvis was performed using the standard protocol during bolus administration of intravenous contrast. CONTRAST:  176m OMNIPAQUE IOHEXOL 350 MG/ML SOLN COMPARISON:  Chest radiographs 02/22/2016 and 02/18/2016. Nuclear medicine pulmonary ventilation and perfusion study 01/31/2016. FINDINGS: CTA CHEST FINDINGS Mediastinum: The pulmonary arteries are well opacified with contrast. There is no evidence of acute pulmonary embolism. There is atherosclerosis of the aorta, great vessels and coronary arteries.The heart is enlarged. There is a right subclavian pacemaker with leads in the right atrium, right ventricle and coronary sinus. There is no significant pericardial effusion. There are no enlarged mediastinal, hilar or axillary lymph nodes. The thyroid gland, trachea and esophagus demonstrate no significant findings. Lungs/Pleura: There is trace pleural fluid bilaterally. No pneumothorax or pneumomediastinum.Dependent pulmonary opacities bilaterally are most consistent with atelectasis. There is no consolidation or endobronchial lesion. Musculoskeletal/Chest wall: No chest wall lesion or acute osseous findings. CT ABDOMEN and PELVIS FINDINGS Hepatobiliary: The liver is normal in density without focal abnormality. There is a small gallstone. There is no significant gallbladder wall thickening or biliary dilatation. There is no pneumobilia. Pancreas: Unremarkable. No pancreatic ductal dilatation or surrounding inflammatory changes. Spleen: Normal in size  without focal abnormality. Adrenals/Urinary Tract: Both adrenal  glands appear normal. There are multiple exophytic low-density renal lesions bilaterally which are likely all cysts. Some of these are too small to optimally characterize. No evidence of enhancing renal mass, urinary tract calculus or hydronephrosis. There is bladder wall thickening with a recent trans urethral resection defect in the prostate gland. Stomach/Bowel: The distal stomach and proximal duodenum demonstrate irregular wall thickening and surrounding inflammation. There is an extraluminal fluid collection extending posteriorly from the duodenal bulb with surrounding extraluminal fluid and air, highly suspicious for perforation of an ulcer. There is a small amount of fluid surrounding the liver and spleen as well as a moderate amount of free intraperitoneal air. Extraluminal air extends into the porta hepatis. No other bowel wall thickening or significant distention. The appendix appears normal. Vascular/Lymphatic: There are no enlarged abdominal or pelvic lymph nodes. There is diffuse atherosclerosis of the aorta, its branches and the iliac arteries. Reproductive: As above, there is a recent TUR defect within the prostate gland which is heterogeneous in density. Other: As above, pneumoperitoneum with free fluid in the upper abdomen. No generalized ascites or focal extraluminal fluid collection identified. Musculoskeletal: No acute or significant osseous findings. Review of the MIP images confirms the above findings. IMPRESSION: 1. Pneumoperitoneum with extraluminal air and fluid surrounding the distal stomach and proximal duodenum highly suspicious for a perforated peptic ulcer. General surgical evaluation recommended. 2. No evidence of bowel obstruction or abscess. 3. No evidence of acute pulmonary embolism. 4. Small bilateral pleural effusions with mild dependent atelectasis at both lung bases. 5. Moderate diffuse atherosclerosis. 6. Cholelithiasis, bilateral renal cysts and recent postsurgical changes in  the prostate gland. 7. Critical Value/emergent results were called by telephone at the time of interpretation on 02/22/2016 at 3:29 pm to Dr. Reyne Dumas , who verbally acknowledged these results. Electronically Signed   By: Richardean Sale M.D.   On: 02/22/2016 15:34   Dg Chest Port 1 View  02/22/2016  CLINICAL DATA:  Onset of chest pain and shortness of breath now. EXAM: PORTABLE CHEST 1 VIEW COMPARISON:  PA and lateral chest x-ray of February 18, 2016 FINDINGS: The lungs are mildly hypoinflated. The interstitial markings are increased bilaterally. The cardiac silhouette is enlarged and the pulmonary vascularity is engorged. The permanent pacemaker defibrillator is in stable position. The mediastinum is normal in width. There is no pleural effusion or pneumothorax. IMPRESSION: CHF with mild pulmonary interstitial edema. There is no alveolar edema nor pneumonia. Electronically Signed   By: David  Martinique M.D.   On: 02/22/2016 08:24    Review of Systems  Constitutional: Positive for fever, chills, weight loss and malaise/fatigue. Negative for diaphoresis.  Eyes: Negative for blurred vision, double vision, photophobia, pain, discharge and redness.  Respiratory: Negative for cough, hemoptysis, sputum production, shortness of breath and wheezing.   Cardiovascular: Negative for chest pain, palpitations, orthopnea, claudication, leg swelling and PND.  Gastrointestinal: Positive for nausea, abdominal pain, diarrhea and melena. Negative for heartburn, vomiting, constipation and blood in stool.  Genitourinary: Negative for dysuria, urgency, frequency, hematuria and flank pain.  Musculoskeletal: Positive for back pain. Negative for myalgias and neck pain.  Neurological: Negative for dizziness, tingling, tremors, sensory change, speech change, focal weakness, seizures, loss of consciousness and weakness.  Psychiatric/Behavioral: Negative for depression, suicidal ideas and substance abuse.   Blood pressure 112/55,  pulse 102, temperature 97.8 F (36.6 C), temperature source Oral, resp. rate 16, height '5\' 10"'  (1.778 m), weight 141.341 kg (311 lb  9.6 oz), SpO2 100 %. Physical Exam  Constitutional: He is oriented to person, place, and time. He appears well-developed and well-nourished. No distress.  Cardiovascular: Normal rate, regular rhythm, normal heart sounds and intact distal pulses.  Exam reveals no gallop and no friction rub.   No murmur heard. Respiratory: Effort normal and breath sounds normal.  GI: Soft. Bowel sounds are normal. He exhibits distension. He exhibits no mass. There is tenderness. There is rebound. There is no guarding.  Musculoskeletal: Normal range of motion. He exhibits no edema or tenderness.  Neurological: He is alert and oriented to person, place, and time.  Skin: Skin is warm and dry. No rash noted. He is not diaphoretic. No erythema.  Psychiatric: He has a normal mood and affect. His behavior is normal. Judgment and thought content normal.    Assessment/Plan: Pneumoperitoneum-likely a perforated peptic ulcer.  Will obtain a INR and type and screen. To OR urgently for a laparotomy.   Clindamycin on Call to to OR.  Maritsa Hunsucker ANP-BC 02/22/2016, 4:08 PM

## 2016-02-22 NOTE — Progress Notes (Signed)
Reason for Consult:   Epigastric pain  Requesting Physician: [facility] Primary Cardiologist Dr Martinique  HPI:   70 y/o followed by Dr Martinique with CAD s/p remote anterior myocardial infarction in 2000 treated with an LAD PCI, patent in 2009 but with an ischemic cardiomyopathy, and ejection fraction of 23%. He is status post MDT CRT D 2009 with a generator replacement in September 2014. He also has a history of atrial fibrillation. He is intolerant of amiodarone. His QT interval is too long for Tikosyn. He previously had attempted to increase his carvedilol dose but he developed significant hypotension. He is on chronic Coumadin anticoagulation-CHADs VASc=4 (age, CHF, HTN, vascular disease).  He recently had a TUR- 01/25/16. He apparently had been on post procedure antibiotics (Cipro) for several weeks. He presented to the ED at [facility] 02/18/16 with dizziness and hypotension. His INR was greater than his Hgb- INR 8.6- Hgb 8.2 with positive stools. He was given Vit K in the ED and transfused. He was also found to have C. Diff colitis and this was treated. He was gradually improving till yesterday when he complained of epigastric pain- relieved with ant acid. This am rapid response was called for chest pain unrelieved with antacids. The describes epigastric discomfort, worse with inspiration (he is taking only shallow breaths).  NO improvement with changing position. EKG shows paced rythm, initial Troponin negative- D.Dimer elevated at 1.6.   PMHx:  Past Medical History  Diagnosis Date  . Coronary artery disease   . MI (myocardial infarction) ([place]) 2000    ANTERIOR, s/p PCI  . Ischemic cardiomyopathy     EF 23%  . Persistent atrial fibrillation (HCC)     on coumadin  . Chronic renal insufficiency   . Dyslipidemia   . Depression   . ED (erectile dysfunction)   . Other primary cardiomyopathies   . Presence of permanent cardiac pacemaker   . AICD (automatic cardioverter/defibrillator)  present   . Dysrhythmia     atrial fibrillation  . CHF (congestive heart failure) ([place])   . Shortness of breath dyspnea     on exertion    Past Surgical History  Procedure Laterality Date  . Cardiac catheterization  02/05/2008    MODERATE TO SEVERE LEFT VENTRICULAR  DYSFUNCTION WITH EF 25-30%  . Coronary stent placement      LAD  . Transthoracic echocardiogram  12/2010    EF 30-35%  . Cardiac defibrillator placement  2009    MDT BI-VENTRICULAR ICD by Dr Caryl Comes  . Wrist fracture surgery    . Breast lumpectomy    . Implantable cardioverter defibrillator generator change  2014  . Biv icd genertaor change out N/A 09/16/2013    Procedure: BIV ICD GENERTAOR CHANGE OUT;  Surgeon: [name], MD;  Location: [facility] CATH LAB;  Service: Cardiovascular;  Laterality: N/A;  . Insert / replace / remove pacemaker      ICD  . Coronary angioplasty  2000  . Cystoscopy N/A 01/25/2016    Procedure: CYSTOSCOPY;  Surgeon: Carolan Clines, MD;  Location: [facility] ORS;  Service: Urology;  Laterality: N/A;  . Transurethral resection of prostate N/A 01/25/2016    Procedure: TRANSURETHRAL RESECTION OF THE PROSTATE (TURP);  Surgeon: Carolan Clines, MD;  Location: [facility] ORS;  Service: Urology;  Laterality: N/A;    SOCHx:  reports that he quit smoking about 17 years ago. His smoking use included Cigarettes. He does not have any smokeless tobacco history on  file. He reports that he does not drink alcohol or use illicit drugs.  FAMHx: Family History  Problem Relation Age of Onset  . Breast cancer Mother   . Depression Mother   . Heart attack Father   . Breast cancer Sister     ALLERGIES: Allergies  Allergen Reactions  . Keflex [Cephalexin] Shortness Of Breath and Other (See Comments)    dizziness    ROS: Review of Systems: General: negative for chills, fever, night sweats or weight changes.  Cardiovascular: negative for chest pain, dyspnea on exertion, edema, orthopnea, palpitations, paroxysmal nocturnal  dyspnea or shortness of breath HEENT: negative for any visual disturbances, blindness, glaucoma Dermatological: negative for rash Respiratory: negative for cough, hemoptysis, or wheezing Urologic: negative for hematuria or dysuria Abdominal: negative for nausea, vomiting, diarrhea, bright red blood per rectum, melena, or hematemesis Neurologic: negative for visual changes, syncope, or dizziness Musculoskeletal: negative for back pain, joint pain, or swelling Psych: cooperative and appropriate All other systems reviewed and are otherwise negative except as noted above.   HOME MEDICATIONS: Prior to Admission medications   Medication Sig Start Date End Date Taking? Authorizing Provider  Ascorbic Acid (VITAMIN C) 1000 MG tablet Take 500 mg by mouth daily.   Yes Historical Provider, MD  carvedilol (COREG) 25 MG tablet Take one-half tablet by  mouth twice a day with  meals Patient taking differently: Take 6.25 mg by mouth daily.  05/25/15  Yes Peter M Martinique, MD  [place] 125 MCG tablet Take one-half tablet by  mouth daily 07/14/15  Yes [name], MD  eplerenone (INSPRA) 25 MG tablet Take 1 tablet by mouth  daily Patient taking differently: Take 1 tablet by mouth  daily at 12 noon 10/13/15  Yes Peter M Martinique, MD  fenofibrate 160 MG tablet Take 1 tablet by mouth  daily 11/16/15  Yes Peter M Martinique, MD  furosemide (LASIX) 40 MG tablet Take 0.5 tablets (20 mg total) by mouth daily. 02/08/16  Yes Peter M Martinique, MD  losartan (COZAAR) 25 MG tablet Take 1 tablet (25 mg total) by mouth daily. 05/25/15  Yes Peter M Martinique, MD  Methen-Hyosc-Meth Blue-Na Phos (UROGESIC-BLUE) 81.6 MG TABS Take 1 tablet 2-4 x/day as needed for urinary spasm or  burning 01/26/16  Yes Carolan Clines, MD  metoprolol succinate (TOPROL-XL) 25 MG 24 hr tablet TAKE 1 TABLET BY MOUTH DAILY. TAKE WITH OR IMMEDIATELY FOLLOWING A MEAL 07/10/15  Yes [name], MD  metoprolol succinate (TOPROL-XL) 25 MG 24 hr tablet Take 12.5 mg by  mouth daily.   Yes Historical Provider, MD  Multiple Vitamin (MULTIVITAMIN WITH MINERALS) TABS tablet Take 1 tablet by mouth daily.   Yes Historical Provider, MD  nitroGLYCERIN (NITROSTAT) 0.4 MG SL tablet Place 1 tablet (0.4 mg total) under the tongue every 5 (five) minutes as needed. 10/28/15  Yes Peter M Martinique, MD  potassium chloride SA (K-DUR,KLOR-CON) 20 MEQ tablet Take one-half tablet by  mouth daily 07/14/15  Yes Peter M Martinique, MD  pravastatin (PRAVACHOL) 40 MG tablet Take 1 tablet by mouth  daily Patient taking differently: Take 1 tablet by mouth  daily in evening 12/15/15  Yes Peter M Martinique, MD  sertraline (ZOLOFT) 100 MG tablet Take 100 mg by mouth daily.     Yes Historical Provider, MD  warfarin (COUMADIN) 5 MG tablet Take 2.5-5 mg by mouth daily at 6 PM. 5mg  daily except 2.5 mg on Monday, Wednesday, Friday   Yes Historical Provider, MD  cephALEXin (  KEFLEX) 500 MG capsule Take 1 capsule (500 mg total) by mouth 2 (two) times daily. Patient not taking: Reported on 01/31/2016 01/26/16   Carolan Clines, MD  folic acid (FOLVITE) [id number] MCG tablet Take 800 mcg by mouth daily.     Historical Provider, MD  LORazepam (ATIVAN) 1 MG tablet Take 1 mg by mouth every 8 (eight) hours as needed for anxiety.     Historical Provider, MD  oxyCODONE-acetaminophen (ROXICET) 5-325 MG tablet Take 1 tablet by mouth every 4 (four) hours as needed for severe pain. Patient not taking: Reported on 01/31/2016 01/26/16   Carolan Clines, MD    HOSPITAL MEDICATIONS: I have reviewed the patient's current medications.  VITALS: Blood pressure 95/60, pulse 56, temperature 97.8 F (36.6 C), temperature source Oral, resp. rate 16, height 5\' 10"  (1.778 m), weight 311 lb 9.6 oz (141.341 kg), SpO2 100 %.  PHYSICAL EXAM: General appearance: alert, cooperative, mild distress, mildly obese and pale Neck: no JVD Lungs: clear to auscultation bilaterally and unable to take a deep breath Heart: regular rate and  rhythm Abdomen: appears slightly distended, BS quiet, diffusely tender Extremities: no edema Pulses: diminnished Skin: pale, cool, dry Neurologic: Grossly normal  LABS: Results for orders placed or performed during the hospital encounter of 02/18/16 (from the past 24 hour(s))  Troponin I     Status: None   Collection Time: 02/22/16  9:32 AM  Result Value Ref Range   Troponin I <0.03 <0.031 ng/mL  D-dimer, quantitative (not at [facility])     Status: Abnormal   Collection Time: 02/22/16  9:32 AM  Result Value Ref Range   D-Dimer, Quant 1.60 (H) 0.00 - 0.50 ug/mL-FEU  Comprehensive metabolic panel     Status: Abnormal   Collection Time: 02/22/16  9:32 AM  Result Value Ref Range   Sodium 140 135 - 145 mmol/L   Potassium 4.1 3.5 - 5.1 mmol/L   Chloride 111 101 - 111 mmol/L   CO2 21 (L) 22 - 32 mmol/L   Glucose, Bld 130 (H) 65 - 99 mg/dL   BUN 14 6 - 20 mg/dL   Creatinine, Ser 1.30 (H) 0.61 - 1.24 mg/dL   Calcium 8.6 (L) 8.9 - 10.3 mg/dL   Total Protein 5.3 (L) 6.5 - 8.1 g/dL   Albumin 2.7 (L) 3.5 - 5.0 g/dL   AST 17 15 - 41 U/L   ALT 11 (L) 17 - 63 U/L   Alkaline Phosphatase 30 (L) 38 - 126 U/L   Total Bilirubin 0.6 0.3 - 1.2 mg/dL   GFR calc non Af Amer 55 (L) >60 mL/min   GFR calc Af Amer >60 >60 mL/min   Anion gap 8 5 - 15  Lipase, blood     Status: Abnormal   Collection Time: 02/22/16  9:32 AM  Result Value Ref Range   Lipase 87 (H) 11 - 51 U/L    EKG: Paced  IMAGING: Dg Chest Port 1 View  02/22/2016  CLINICAL DATA:  Onset of chest pain and shortness of breath now. EXAM: PORTABLE CHEST 1 VIEW COMPARISON:  PA and lateral chest x-ray of February 18, 2016 FINDINGS: The lungs are mildly hypoinflated. The interstitial markings are increased bilaterally. The cardiac silhouette is enlarged and the pulmonary vascularity is engorged. The permanent pacemaker defibrillator is in stable position. The mediastinum is normal in width. There is no pleural effusion or pneumothorax. IMPRESSION: CHF  with mild pulmonary interstitial edema. There is no alveolar edema nor pneumonia. Electronically Signed  By: David  Martinique M.D.   On: 02/22/2016 08:24    IMPRESSION: Principal Problem:   Symptomatic anemia Active Problems:   GI bleed   Supratherapeutic INR   Epigastric pain   CAD S/P LAD PCI- [id number]   Chronic systolic CHF (congestive heart failure) (HCC)   Hypotension   Chronic a-fib (HCC)   CKD (chronic kidney disease) stage 3, GFR 30-59 ml/min   Cardiomyopathy, ischemic-EF 20-25%   Chronic anticoagulation-Couamdin   Biventricular implantable cardioverter-defibrillator Medtronic   BPH- s/p TURP 01/25/16 - followed by 4 weeks of Cipro   S/P TURP   C. difficile colitis   RECOMMENDATION: Cycle Troponin. Symptoms do not seem cardiac currently. He does look significantly uncomfortable. Will follow with you.   Time Spent Directly with Patient: 72 minutes  Kerin Ransom, [place] beeper 02/22/2016, 11:21 AM

## 2016-02-22 NOTE — OR Nursing (Signed)
First call made to unit [facility]South at [facility]. On the way call made @ 2020.

## 2016-02-22 NOTE — Op Note (Signed)
Preoperative diagnosis: likely perforated du Postoperative diagnosis: perforated duodenal ulcer Procedure: graham patch perforated duodenal ulcer Surgeon: Dr Serita Grammes Anes: general EBL: 50 cc Drains 19 Fr blake drain near patch Complications none Specimens none Fluids 2 U FFP given Sponge and needle count correct To icu critical  Indications: This is a 18 yom who presents with peritonitis and likely perforated duodenal ulcer.  His inr was greater than 1.5 so we gave him 2 units of ffp while beginning.  He also underwent placement of a central line.  He was given clindamycin per Dr Audie Pinto order.  He was placed under general anesthesia without complication.  He was prepped and draped in the standard sterile surgical fashion.  A surgical timeout was performed.  I made an upper midline incision and carried this into the peritoneum.  There was murky fluid and bilious fluid in the right upper quadrant.  I inserted a balfour retractor.  The colon appeared healthy.  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  I then placed 2-0 silk sutures on either side of this.  I then used the ligasure to create a tongue of omentum to make a patch.  I then placed this overlying the perforated ulcer and tied the silk sutures over it to hold in place. I placed several more to secure this.  This appeared to completely cover the perforation.  I then irrigated copiously.  I then placed a 19 Fr Blake drain near the patch and secured this with a 2-0 nylon suture. I closed the fascia with a #1 looped pds. I left the skin open and placed a vac. Due to afib, pulmonary status and preop delirium he will be transferred to the icu intubated.

## 2016-02-22 NOTE — Progress Notes (Signed)
[facility] Progress Note [name] DOB: 04-Oct-1946 MRN: [id number]   Date of Service  02/22/2016  HPI/Events of Note  Multiple orders for the same lab studies written by 3 different physicians.   eICU Interventions  Orders amended so that there is no duplication of lab studies.      Intervention Category Intermediate Interventions: Other:  Lysle Dingwall 02/22/2016, 11:58 PM

## 2016-02-23 ENCOUNTER — Ambulatory Visit (HOSPITAL_COMMUNITY): Payer: Medicare Other

## 2016-02-23 ENCOUNTER — Encounter (HOSPITAL_COMMUNITY): Payer: Self-pay | Admitting: General Surgery

## 2016-02-23 ENCOUNTER — Inpatient Hospital Stay (HOSPITAL_COMMUNITY): Payer: Medicare Other

## 2016-02-23 DIAGNOSIS — R109 Unspecified abdominal pain: Secondary | ICD-10-CM | POA: Diagnosis present

## 2016-02-23 DIAGNOSIS — K265 Chronic or unspecified duodenal ulcer with perforation: Secondary | ICD-10-CM | POA: Diagnosis present

## 2016-02-23 DIAGNOSIS — R791 Abnormal coagulation profile: Secondary | ICD-10-CM

## 2016-02-23 DIAGNOSIS — N400 Enlarged prostate without lower urinary tract symptoms: Secondary | ICD-10-CM

## 2016-02-23 DIAGNOSIS — M79606 Pain in leg, unspecified: Secondary | ICD-10-CM

## 2016-02-23 DIAGNOSIS — R1010 Upper abdominal pain, unspecified: Secondary | ICD-10-CM

## 2016-02-23 DIAGNOSIS — D649 Anemia, unspecified: Secondary | ICD-10-CM

## 2016-02-23 DIAGNOSIS — R1084 Generalized abdominal pain: Secondary | ICD-10-CM

## 2016-02-23 DIAGNOSIS — J9601 Acute respiratory failure with hypoxia: Secondary | ICD-10-CM

## 2016-02-23 LAB — COMPREHENSIVE METABOLIC PANEL
ALK PHOS: 27 U/L — AB (ref 38–126)
ALT: 24 U/L (ref 17–63)
AST: 45 U/L — AB (ref 15–41)
Albumin: 2.3 g/dL — ABNORMAL LOW (ref 3.5–5.0)
Anion gap: 10 (ref 5–15)
BUN: 18 mg/dL (ref 6–20)
CALCIUM: 8.5 mg/dL — AB (ref 8.9–10.3)
CHLORIDE: 110 mmol/L (ref 101–111)
CO2: 21 mmol/L — AB (ref 22–32)
CREATININE: 1.47 mg/dL — AB (ref 0.61–1.24)
GFR calc Af Amer: 55 mL/min — ABNORMAL LOW (ref >60)
GFR calc non Af Amer: 47 mL/min — ABNORMAL LOW (ref >60)
GLUCOSE: 114 mg/dL — AB (ref 65–99)
Potassium: 4.1 mmol/L (ref 3.5–5.1)
SODIUM: 141 mmol/L (ref 135–145)
Total Bilirubin: 0.5 mg/dL (ref 0.3–1.2)
Total Protein: 5.5 g/dL — ABNORMAL LOW (ref 6.5–8.1)

## 2016-02-23 LAB — CBC
HEMATOCRIT: 23.9 % — AB (ref 39.0–52.0)
HEMATOCRIT: 22.4 % — AB (ref 39.0–52.0)
HEMOGLOBIN: 7.9 g/dL — AB (ref 13.0–17.0)
HEMOGLOBIN: 7.4 g/dL — AB (ref 13.0–17.0)
MCH: 26.8 pg (ref 26.0–34.0)
MCH: 26.8 pg (ref 26.0–34.0)
MCHC: 33.1 g/dL (ref 30.0–36.0)
MCHC: 33 g/dL (ref 30.0–36.0)
MCV: 81 fL (ref 78.0–100.0)
MCV: 81.2 fL (ref 78.0–100.0)
PLATELETS: 203 10*3/uL (ref 150–400)
Platelets: 196 10*3/uL (ref 150–400)
RBC: 2.95 MIL/uL — AB (ref 4.22–5.81)
RBC: 2.76 MIL/uL — AB (ref 4.22–5.81)
RDW: 15.7 % — AB (ref 11.5–15.5)
RDW: 15.5 % (ref 11.5–15.5)
WBC: 10.3 10*3/uL (ref 4.0–10.5)
WBC: 10.8 10*3/uL — AB (ref 4.0–10.5)

## 2016-02-23 LAB — BASIC METABOLIC PANEL
Anion gap: 10 (ref 5–15)
BUN: 19 mg/dL (ref 6–20)
CALCIUM: 8.5 mg/dL — AB (ref 8.9–10.3)
CHLORIDE: 110 mmol/L (ref 101–111)
CO2: 20 mmol/L — ABNORMAL LOW (ref 22–32)
CREATININE: 1.52 mg/dL — AB (ref 0.61–1.24)
GFR calc Af Amer: 53 mL/min — ABNORMAL LOW (ref >60)
GFR calc non Af Amer: 45 mL/min — ABNORMAL LOW (ref >60)
Glucose, Bld: 109 mg/dL — ABNORMAL HIGH (ref 65–99)
Potassium: 4.1 mmol/L (ref 3.5–5.1)
SODIUM: 140 mmol/L (ref 135–145)

## 2016-02-23 LAB — PREPARE FRESH FROZEN PLASMA
UNIT DIVISION: 0
Unit division: 0

## 2016-02-23 LAB — POCT I-STAT 3, ART BLOOD GAS (G3+)
Acid-base deficit: 2 mmol/L (ref 0.0–2.0)
BICARBONATE: 21.6 meq/L (ref 20.0–24.0)
O2 SAT: 100 %
PCO2 ART: 31.8 mmHg — AB (ref 35.0–45.0)
PO2 ART: 193 mmHg — AB (ref 80.0–100.0)
TCO2: 23 mmol/L (ref 0–100)
pH, Arterial: 7.44 (ref 7.350–7.450)

## 2016-02-23 LAB — TROPONIN I: Troponin I: 0.03 ng/mL (ref <0.031)

## 2016-02-23 LAB — MAGNESIUM: Magnesium: 1.8 mg/dL (ref 1.7–2.4)

## 2016-02-23 LAB — PROTIME-INR
INR: 1.67 — AB (ref 0.00–1.49)
Prothrombin Time: 19.7 seconds — ABNORMAL HIGH (ref 11.6–15.2)

## 2016-02-23 LAB — PHOSPHORUS: PHOSPHORUS: 3.8 mg/dL (ref 2.5–4.6)

## 2016-02-23 MED ORDER — METOPROLOL TARTRATE 1 MG/ML IV SOLN
2.5000 mg | Freq: Four times a day (QID) | INTRAVENOUS | Status: DC
  Administered 2016-02-23 – 2016-02-26 (×12): 2.5 mg via INTRAVENOUS
  Filled 2016-02-23 (×12): qty 5

## 2016-02-23 MED ORDER — SODIUM CHLORIDE 0.9 % IV SOLN
80.0000 mg | Freq: Two times a day (BID) | INTRAVENOUS | Status: DC
  Administered 2016-02-23 – 2016-03-02 (×17): 80 mg via INTRAVENOUS
  Filled 2016-02-23 (×30): qty 80

## 2016-02-23 NOTE — Progress Notes (Addendum)
Initial Nutrition Assessment  DOCUMENTATION CODES:   Obesity unspecified  INTERVENTION:    Initiate nutrition support within next 24 to 48 hours (EN vs TPN)  NUTRITION DIAGNOSIS:   Inadequate oral intake related to inability to eat as evidenced by NPO status  GOAL:   Provide needs based on ASPEN/SCCM guidelines  MONITOR:   Vent status, Labs, Weight trends, Skin, I & O's  REASON FOR ASSESSMENT:   Ventilator  ASSESSMENT:   70 yo Male with PMH of CHF, CAD, MI, AICD. Status post TURP to 617 on Cipro since. Admitted 02/18/2016 with low-grade abdominal pain nausea. Found to be C. difficile PCR positive. Has had melena recently. CT scan shows low volume but clear-cut pneumoperitoneum, extraluminal air and fluid surrounding the distal stomach suspicious for perforated duodenal ulcer. No evidence of obstruction or abscess. No evidence of pulmonary embolism.-No significant bowel wall thickening. No radiographic evidence of acute colitis..  Patient s/p procedure 3/6: GRAHAM PATCH PERFORATED DUODENAL ULCER  Patient is currently intubated on ventilator support -- NGT to LIS  Temp (24hrs), Avg:98.3 F (36.8 C), Min:97.7 F (36.5 C), Max:99.2 F (37.3 C)   Propofol: 42.4 ml/hr ----> 1119 fat kcals  CCM note 3/6 reviewed.  Pt with acute respiratory failure due to inability to protect airway with pulmonary edema.    No muscle or subcutaneous fat depletion noticed.  Diet Order:  Diet NPO time specified  Skin:  Wound (see comment) (abdominal wound VAC)  Last BM:  3/5  Height:   Ht Readings from Last 1 Encounters:  02/18/16 5\' 10"  (1.778 m)    Weight:   Wt Readings from Last 1 Encounters:  02/23/16 218 lb 4.1 oz (99 kg)    Ideal Body Weight:  75.4 kg  BMI:  Body mass index is 31.32 kg/(m^2).  Estimated Nutritional Needs:   Kcal:  [id number]  Protein:  140-150 gm  Fluid:  per MD  EDUCATION NEEDS:   No education needs identified at this time  Arthur Holms, RD, LDN Pager #: [phone] After-Hours Pager #: [phone]

## 2016-02-23 NOTE — Progress Notes (Signed)
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 difficile colitis   Duodenal ulcer with perforation (HCC)   Chronic a-fib (HCC)   Chronic anticoagulation-Couamdin   CAD S/P LAD PCI- 2000   BPH- s/p TURP 01/25/16 - followed by 4 weeks of Cipro   GI bleed   Hypotension   S/P TURP   CKD (chronic kidney disease) stage 3, GFR 30-59 ml/min   Epigastric pain   Acute respiratory failure with hypoxia ([place])   Perforated ulcer ([place])   Admitted with Abd pain & fatigue - noted to be Anemic with supratherapeutic INR. Noted to have C-diff colitis, but on CT scan yesterday noted to have Duodenal Ulcer -- s/p Ex Lap & ulcer closure.   Subjective   Intubated.  On weaning protocol - starting to arouse. Notably uncomfortable.    Inpatient Medications    . antiseptic oral rinse  7 mL Mouth Rinse QID  . chlorhexidine gluconate  15 mL Mouth Rinse BID  . famotidine (PEPCID) IV  20 mg Intravenous Q12H  . folic acid  1 mg Intravenous QHS  . ipratropium-albuterol  3 mL Nebulization QID  . metronidazole  500 mg Intravenous 3 times per day  . piperacillin-tazobactam (ZOSYN)  IV  3.375 g Intravenous 3 times per day  . vancomycin  125 mg Oral QID    Vital Signs    Filed Vitals:   02/23/16 0823 02/23/16 0837 02/23/16 0900 02/23/16 1000  BP:   95/59 94/69  Pulse:   99 109  Temp:  98.5 F (36.9 C)    TempSrc:  Axillary    Resp:   15 15  Height:      Weight:      SpO2: 100%  100% 100%    Intake/Output Summary (Last 24 hours) at 02/23/16 1043 Last data filed at 02/23/16 1000  Gross per 24 hour  Intake 3841.3 ml  Output    780 ml  Net 3061.3 ml   Filed Weights   02/21/16 0702 02/22/16 0500 02/23/16 0422    Weight: 213 lb (96.616 kg) 311 lb 9.6 oz (141.341 kg) 218 lb 4.1 oz (99 kg)    Physical Exam    General: Pleasant, NAD. Neuro: Alert and oriented X 3. Moves all extremities spontaneously. Psych: Normal affect. HEENT:  Normal  Neck: Supple without bruits or JVD. Lungs:  Resp regular and unlabored, CTA. Heart: Irreg-Irreg (a bit fast during wean process) no s3, s4, or murmurs. Abdomen: Soft, non-tender, non-distended, BS + x 4.  Extremities: No clubbing, cyanosis or edema. DP/PT/Radials 2+ and equal bilaterally.  Labs    CBC  Recent Labs  02/23/16 0012 02/23/16 0808  WBC 10.8* 10.3  HGB 7.4* 7.9*  HCT 22.4* 23.9*  MCV 81.2 81.0  PLT 203 [id number]   Basic Metabolic Panel  Recent Labs  02/22/16 2300 02/23/16 0330  NA 140 141  K 4.1 4.1  CL 110 110  CO2 20* 21*  GLUCOSE 109* 114*  BUN 19 18  CREATININE 1.52* 1.47*  CALCIUM 8.5* 8.5*  MG  --  1.8  PHOS  --  3.8   Liver Function Tests  Recent Labs  02/22/16 0932 02/23/16 0330  AST 17 45*  ALT 11* 24  ALKPHOS  30* 27*  BILITOT 0.6 0.5  PROT 5.3* 5.5*  ALBUMIN 2.7* 2.3*    Recent Labs  02/22/16 0932  LIPASE 87*   Cardiac Enzymes  Recent Labs  02/22/16 0932 02/22/16 1338 02/22/16 2300  TROPONINI <0.03 <0.03 0.03   BNP Invalid input(s): POCBNP D-Dimer  Recent Labs  02/22/16 0932  DDIMER 1.60*   Hemoglobin A1C No results for input(s): HGBA1C in the last 72 hours. Fasting Lipid Panel No results for input(s): CHOL, HDL, LDLCALC, TRIG, CHOLHDL, LDLDIRECT in the last 72 hours. Thyroid Function Tests No results for input(s): TSH, T4TOTAL, T3FREE, THYROIDAB in the last 72 hours.  Invalid input(s): FREET3  Telemetry    Afib with PVCs in couplets & triplets.  Intermittently paced rates mostly in 90s until agitated  ECG    n/a  Radiology    Ct Angio Chest Pe W/cm &/or Wo Cm  02/22/2016  CLINICAL DATA:  Shortness of breath and chest pain. History of coronary artery disease, myocardial  infarction and congestive heart failure. EXAM: CT ANGIOGRAPHY CHEST CT ABDOMEN AND PELVIS WITH CONTRAST TECHNIQUE: Multidetector CT imaging of the chest was performed using the standard protocol during bolus administration of intravenous contrast. Multiplanar CT image reconstructions and MIPs were obtained to evaluate the vascular anatomy. Multidetector CT imaging of the abdomen and pelvis was performed using the standard protocol during bolus administration of intravenous contrast. CONTRAST:  173mL OMNIPAQUE IOHEXOL 350 MG/ML SOLN COMPARISON:  Chest radiographs 02/22/2016 and 02/18/2016. Nuclear medicine pulmonary ventilation and perfusion study 01/31/2016. FINDINGS: CTA CHEST FINDINGS Mediastinum: The pulmonary arteries are well opacified with contrast. There is no evidence of acute pulmonary embolism. There is atherosclerosis of the aorta, great vessels and coronary arteries.The heart is enlarged. There is a right subclavian pacemaker with leads in the right atrium, right ventricle and coronary sinus. There is no significant pericardial effusion. There are no enlarged mediastinal, hilar or axillary lymph nodes. The thyroid gland, trachea and esophagus demonstrate no significant findings. Lungs/Pleura: There is trace pleural fluid bilaterally. No pneumothorax or pneumomediastinum.Dependent pulmonary opacities bilaterally are most consistent with atelectasis. There is no consolidation or endobronchial lesion. Musculoskeletal/Chest wall: No chest wall lesion or acute osseous findings. CT ABDOMEN and PELVIS FINDINGS Hepatobiliary: The liver is normal in density without focal abnormality. There is a small gallstone. There is no significant gallbladder wall thickening or biliary dilatation. There is no pneumobilia. Pancreas: Unremarkable. No pancreatic ductal dilatation or surrounding inflammatory changes. Spleen: Normal in size without focal abnormality. Adrenals/Urinary Tract: Both adrenal glands appear normal. There  are multiple exophytic low-density renal lesions bilaterally which are likely all cysts. Some of these are too small to optimally characterize. No evidence of enhancing renal mass, urinary tract calculus or hydronephrosis. There is bladder wall thickening with a recent trans urethral resection defect in the prostate gland. Stomach/Bowel: The distal stomach and proximal duodenum demonstrate irregular wall thickening and surrounding inflammation. There is an extraluminal fluid collection extending posteriorly from the duodenal bulb with surrounding extraluminal fluid and air, highly suspicious for perforation of an ulcer. There is a small amount of fluid surrounding the liver and spleen as well as a moderate amount of free intraperitoneal air. Extraluminal air extends into the porta hepatis. No other bowel wall thickening or significant distention. The appendix appears normal. Vascular/Lymphatic: There are no enlarged abdominal or pelvic lymph nodes. There is diffuse atherosclerosis of the aorta, its branches and the iliac arteries. Reproductive: As above, there is a recent TUR defect within the prostate gland which  is heterogeneous in density. Other: As above, pneumoperitoneum with free fluid in the upper abdomen. No generalized ascites or focal extraluminal fluid collection identified. Musculoskeletal: No acute or significant osseous findings. Review of the MIP images confirms the above findings. IMPRESSION: 1. Pneumoperitoneum with extraluminal air and fluid surrounding the distal stomach and proximal duodenum highly suspicious for a perforated peptic ulcer. General surgical evaluation recommended. 2. No evidence of bowel obstruction or abscess. 3. No evidence of acute pulmonary embolism. 4. Small bilateral pleural effusions with mild dependent atelectasis at both lung bases. 5. Moderate diffuse atherosclerosis. 6. Cholelithiasis, bilateral renal cysts and recent postsurgical changes in the prostate gland. 7.  Critical Value/emergent results were called by telephone at the time of interpretation on 02/22/2016 at 3:29 pm to Dr. Reyne Dumas , who verbally acknowledged these results. Electronically Signed   By: Richardean Sale M.D.   On: 02/22/2016 15:34   Ct Abdomen Pelvis W Contrast  02/22/2016  CLINICAL DATA:  Shortness of breath and chest pain. History of coronary artery disease, myocardial infarction and congestive heart failure. EXAM: CT ANGIOGRAPHY CHEST CT ABDOMEN AND PELVIS WITH CONTRAST TECHNIQUE: Multidetector CT imaging of the chest was performed using the standard protocol during bolus administration of intravenous contrast. Multiplanar CT image reconstructions and MIPs were obtained to evaluate the vascular anatomy. Multidetector CT imaging of the abdomen and pelvis was performed using the standard protocol during bolus administration of intravenous contrast. CONTRAST:  121mL OMNIPAQUE IOHEXOL 350 MG/ML SOLN COMPARISON:  Chest radiographs 02/22/2016 and 02/18/2016. Nuclear medicine pulmonary ventilation and perfusion study 01/31/2016. FINDINGS: CTA CHEST FINDINGS Mediastinum: The pulmonary arteries are well opacified with contrast. There is no evidence of acute pulmonary embolism. There is atherosclerosis of the aorta, great vessels and coronary arteries.The heart is enlarged. There is a right subclavian pacemaker with leads in the right atrium, right ventricle and coronary sinus. There is no significant pericardial effusion. There are no enlarged mediastinal, hilar or axillary lymph nodes. The thyroid gland, trachea and esophagus demonstrate no significant findings. Lungs/Pleura: There is trace pleural fluid bilaterally. No pneumothorax or pneumomediastinum.Dependent pulmonary opacities bilaterally are most consistent with atelectasis. There is no consolidation or endobronchial lesion. Musculoskeletal/Chest wall: No chest wall lesion or acute osseous findings. CT ABDOMEN and PELVIS FINDINGS Hepatobiliary: The  liver is normal in density without focal abnormality. There is a small gallstone. There is no significant gallbladder wall thickening or biliary dilatation. There is no pneumobilia. Pancreas: Unremarkable. No pancreatic ductal dilatation or surrounding inflammatory changes. Spleen: Normal in size without focal abnormality. Adrenals/Urinary Tract: Both adrenal glands appear normal. There are multiple exophytic low-density renal lesions bilaterally which are likely all cysts. Some of these are too small to optimally characterize. No evidence of enhancing renal mass, urinary tract calculus or hydronephrosis. There is bladder wall thickening with a recent trans urethral resection defect in the prostate gland. Stomach/Bowel: The distal stomach and proximal duodenum demonstrate irregular wall thickening and surrounding inflammation. There is an extraluminal fluid collection extending posteriorly from the duodenal bulb with surrounding extraluminal fluid and air, highly suspicious for perforation of an ulcer. There is a small amount of fluid surrounding the liver and spleen as well as a moderate amount of free intraperitoneal air. Extraluminal air extends into the porta hepatis. No other bowel wall thickening or significant distention. The appendix appears normal. Vascular/Lymphatic: There are no enlarged abdominal or pelvic lymph nodes. There is diffuse atherosclerosis of the aorta, its branches and the iliac arteries. Reproductive: As above, there is a  recent TUR defect within the prostate gland which is heterogeneous in density. Other: As above, pneumoperitoneum with free fluid in the upper abdomen. No generalized ascites or focal extraluminal fluid collection identified. Musculoskeletal: No acute or significant osseous findings. Review of the MIP images confirms the above findings. IMPRESSION: 1. Pneumoperitoneum with extraluminal air and fluid surrounding the distal stomach and proximal duodenum highly suspicious for  a perforated peptic ulcer. General surgical evaluation recommended. 2. No evidence of bowel obstruction or abscess. 3. No evidence of acute pulmonary embolism. 4. Small bilateral pleural effusions with mild dependent atelectasis at both lung bases. 5. Moderate diffuse atherosclerosis. 6. Cholelithiasis, bilateral renal cysts and recent postsurgical changes in the prostate gland. 7. Critical Value/emergent results were called by telephone at the time of interpretation on 02/22/2016 at 3:29 pm to Dr. Reyne Dumas , who verbally acknowledged these results. Electronically Signed   By: Richardean Sale M.D.   On: 02/22/2016 15:34   Portable Chest Xray  02/22/2016  CLINICAL DATA:  Acute respiratory failure with hypoxia EXAM: PORTABLE CHEST 1 VIEW COMPARISON:  02/22/2016 FINDINGS: Endotracheal tube 4.3 cm above the carina. Internal jugular central line on the right with tip at the cavoatrial junction. Two lead cardiac pacer noted. NG tube appears to cross the gastroesophageal junction. Mild cardiac enlargement stable. Venous congestion without edema. Mild hypoventilatory change at both lung bases. IMPRESSION: Mild bibasilar opacity most consistent with atelectasis. Electronically Signed   By: Skipper Cliche M.D.   On: 02/22/2016 21:07   Dg Chest Port 1 View  02/22/2016  CLINICAL DATA:  Onset of chest pain and shortness of breath now. EXAM: PORTABLE CHEST 1 VIEW COMPARISON:  PA and lateral chest x-ray of February 18, 2016 FINDINGS: The lungs are mildly hypoinflated. The interstitial markings are increased bilaterally. The cardiac silhouette is enlarged and the pulmonary vascularity is engorged. The permanent pacemaker defibrillator is in stable position. The mediastinum is normal in width. There is no pleural effusion or pneumothorax. IMPRESSION: CHF with mild pulmonary interstitial edema. There is no alveolar edema nor pneumonia. Electronically Signed   By: Myla Mauriello  [place] M.D.   On: 02/22/2016 08:24    Assessment & Plan     Symptomatic anemia /GI bleed- Hgb 7.9 post -op.  Would have low threshold for transfusion of at least 1 unit PRBC given Cardiomyopathy -- would follow with Lasix.    Cardiomyopathy, ischemic-EF [id number] Chronic systolic CHF (congestive heart failure) (HCC) - no active HF exacerbation Sx. CXR looks clear.   Avoid XS volume load. -- Will need standing diuretic dosing. ~20 mg IV daily while NPO.    Biventricular implantable cardioverter-defibrillator Medtronic   Supratherapeutic INR - reversed.    Chronic a-fib ([place]) / Chronic anticoagulation-Couamdin held for surgery & GIB.    Rate currently elevated due to agitation, was stable when I first arrived. Will convert PO Metoprolol to IV 2.5 mg q6hr for now. -- will need to clarify his home regimen (has both Cored & Toprol listed)  Would restart warfarin (without bridge) once safe from a post-op standpoint.  Transfusion to increase Hgb will help with rate control.  CAD S/P LAD PCI- 2000 - no antecedent angina.  Would expect a low Troponin elevation with existing CAD & Cardiomyopathy, but would simply continue home Rx as best possible.   C. difficile colitis - per PCCM    Duodenal ulcer with perforation ([place]) - s/p repair yesterday.  Plan is TPN to start ~tomorrow -- would ensure that we use concentrated feeds  to aviod volume overload.     BPH- s/p TURP 01/25/16 - followed by 4 weeks of Cipro      Hypotension - stable    CKD (chronic kidney disease) stage 3, GFR 30-59 ml/min: stable      Acute respiratory failure with hypoxia (HCC) - Intubated for surgery with slow wean from Vent today.     Signed, Leonie Man, M.D., M.S. Interventional Cardiologist   Pager # [phone] Phone # [phone] [address]. [place], [place] [address]

## 2016-02-23 NOTE — Progress Notes (Signed)
[facility] Progress Note  1 Day Post-Op  Subjective: Pt intubated/sedated.  Does not arouse for me.  No visualized pain with palpation.  No family at bedside.  Objective: Vital signs in last 24 hours: Temp:  [97.7 F (36.5 C)-99.2 F (37.3 C)] 98.5 F (36.9 C) (03/07 0837) Pulse Rate:  [85-112] 98 (03/07 0800) Resp:  [15-22] 15 (03/07 0800) BP: (86-142)/(48-67) 112/67 mmHg (03/07 0800) SpO2:  [100 %] 100 % (03/07 0823) Arterial Line BP: (91-144)/(41-69) 122/50 mmHg (03/07 0800) FiO2 (%):  [40 %-50 %] 40 % (03/07 0823) Weight:  [99 kg (218 lb 4.1 oz)] 99 kg (218 lb 4.1 oz) (03/07 0422) Last BM Date: 02/21/16  Intake/Output from previous day: 03/06 0701 - 03/07 0700 In: 3578.3 [P.O.:240; I.V.:2648.3; Blood:440; IV Piggyback:250] Out: 530 [Urine:500; Blood:30] Intake/Output this shift: Total I/O In: 113 [I.V.:113] Out: 250 [Urine:150; Drains:100]  PE: Gen:  Alert, NAD, pleasant Card:  RRR, no M/G/R heard Pulm:  CTA, no W/R/R Abd: Soft, distended, NT, wound vac/JP drain in place with 13mL output, +BS, no HSM, wound vac sponge in place Ext:  No erythema, or tenderness, +edema to all 4 extremmities  Lab Results:   Recent Labs  02/23/16 0012 02/23/16 0808  WBC 10.8* 10.3  HGB 7.4* 7.9*  HCT 22.4* 23.9*  PLT 203 196   BMET  Recent Labs  02/22/16 2300 02/23/16 0330  NA 140 141  K 4.1 4.1  CL 110 110  CO2 20* 21*  GLUCOSE 109* 114*  BUN 19 18  CREATININE 1.52* 1.47*  CALCIUM 8.5* 8.5*   PT/INR  Recent Labs  02/22/16 1607 02/23/16 0330  LABPROT 19.2* 19.7*  INR 1.61* 1.67*   CMP     Component Value Date/Time   NA 141 02/23/2016 0330   K 4.1 02/23/2016 0330   CL 110 02/23/2016 0330   CO2 21* 02/23/2016 0330   GLUCOSE 114* 02/23/2016 0330   BUN 18 02/23/2016 0330   CREATININE 1.47* 02/23/2016 0330   CALCIUM 8.5* 02/23/2016 0330   PROT 5.5* 02/23/2016 0330   ALBUMIN 2.3* 02/23/2016 0330   AST 45* 02/23/2016 0330   ALT 24  02/23/2016 0330   ALKPHOS 27* 02/23/2016 0330   BILITOT 0.5 02/23/2016 0330   GFRNONAA 47* 02/23/2016 0330   GFRAA 55* 02/23/2016 0330   Lipase     Component Value Date/Time   LIPASE 87* 02/22/2016 0932       Studies/Results: Ct Angio Chest Pe W/cm &/or Wo Cm  02/22/2016  CLINICAL DATA:  Shortness of breath and chest pain. History of coronary artery disease, myocardial infarction and congestive heart failure. EXAM: CT ANGIOGRAPHY CHEST CT ABDOMEN AND PELVIS WITH CONTRAST TECHNIQUE: Multidetector CT imaging of the chest was performed using the standard protocol during bolus administration of intravenous contrast. Multiplanar CT image reconstructions and MIPs were obtained to evaluate the vascular anatomy. Multidetector CT imaging of the abdomen and pelvis was performed using the standard protocol during bolus administration of intravenous contrast. CONTRAST:  158mL OMNIPAQUE IOHEXOL 350 MG/ML SOLN COMPARISON:  Chest radiographs 02/22/2016 and 02/18/2016. Nuclear medicine pulmonary ventilation and perfusion study 01/31/2016. FINDINGS: CTA CHEST FINDINGS Mediastinum: The pulmonary arteries are well opacified with contrast. There is no evidence of acute pulmonary embolism. There is atherosclerosis of the aorta, great vessels and coronary arteries.The heart is enlarged. There is a right subclavian pacemaker with leads in the right atrium, right ventricle and coronary sinus. There is no significant pericardial effusion. There are no enlarged mediastinal, hilar  or axillary lymph nodes. The thyroid gland, trachea and esophagus demonstrate no significant findings. Lungs/Pleura: There is trace pleural fluid bilaterally. No pneumothorax or pneumomediastinum.Dependent pulmonary opacities bilaterally are most consistent with atelectasis. There is no consolidation or endobronchial lesion. Musculoskeletal/Chest wall: No chest wall lesion or acute osseous findings. CT ABDOMEN and PELVIS FINDINGS Hepatobiliary: The  liver is normal in density without focal abnormality. There is a small gallstone. There is no significant gallbladder wall thickening or biliary dilatation. There is no pneumobilia. Pancreas: Unremarkable. No pancreatic ductal dilatation or surrounding inflammatory changes. Spleen: Normal in size without focal abnormality. Adrenals/Urinary Tract: Both adrenal glands appear normal. There are multiple exophytic low-density renal lesions bilaterally which are likely all cysts. Some of these are too small to optimally characterize. No evidence of enhancing renal mass, urinary tract calculus or hydronephrosis. There is bladder wall thickening with a recent trans urethral resection defect in the prostate gland. Stomach/Bowel: The distal stomach and proximal duodenum demonstrate irregular wall thickening and surrounding inflammation. There is an extraluminal fluid collection extending posteriorly from the duodenal bulb with surrounding extraluminal fluid and air, highly suspicious for perforation of an ulcer. There is a small amount of fluid surrounding the liver and spleen as well as a moderate amount of free intraperitoneal air. Extraluminal air extends into the porta hepatis. No other bowel wall thickening or significant distention. The appendix appears normal. Vascular/Lymphatic: There are no enlarged abdominal or pelvic lymph nodes. There is diffuse atherosclerosis of the aorta, its branches and the iliac arteries. Reproductive: As above, there is a recent TUR defect within the prostate gland which is heterogeneous in density. Other: As above, pneumoperitoneum with free fluid in the upper abdomen. No generalized ascites or focal extraluminal fluid collection identified. Musculoskeletal: No acute or significant osseous findings. Review of the MIP images confirms the above findings. IMPRESSION: 1. Pneumoperitoneum with extraluminal air and fluid surrounding the distal stomach and proximal duodenum highly suspicious for  a perforated peptic ulcer. General surgical evaluation recommended. 2. No evidence of bowel obstruction or abscess. 3. No evidence of acute pulmonary embolism. 4. Small bilateral pleural effusions with mild dependent atelectasis at both lung bases. 5. Moderate diffuse atherosclerosis. 6. Cholelithiasis, bilateral renal cysts and recent postsurgical changes in the prostate gland. 7. Critical Value/emergent results were called by telephone at the time of interpretation on 02/22/2016 at 3:29 pm to Dr. Reyne Dumas , who verbally acknowledged these results. Electronically Signed   By: Richardean Sale M.D.   On: 02/22/2016 15:34   Ct Abdomen Pelvis W Contrast  02/22/2016  CLINICAL DATA:  Shortness of breath and chest pain. History of coronary artery disease, myocardial infarction and congestive heart failure. EXAM: CT ANGIOGRAPHY CHEST CT ABDOMEN AND PELVIS WITH CONTRAST TECHNIQUE: Multidetector CT imaging of the chest was performed using the standard protocol during bolus administration of intravenous contrast. Multiplanar CT image reconstructions and MIPs were obtained to evaluate the vascular anatomy. Multidetector CT imaging of the abdomen and pelvis was performed using the standard protocol during bolus administration of intravenous contrast. CONTRAST:  154mL OMNIPAQUE IOHEXOL 350 MG/ML SOLN COMPARISON:  Chest radiographs 02/22/2016 and 02/18/2016. Nuclear medicine pulmonary ventilation and perfusion study 01/31/2016. FINDINGS: CTA CHEST FINDINGS Mediastinum: The pulmonary arteries are well opacified with contrast. There is no evidence of acute pulmonary embolism. There is atherosclerosis of the aorta, great vessels and coronary arteries.The heart is enlarged. There is a right subclavian pacemaker with leads in the right atrium, right ventricle and coronary sinus. There is no significant  pericardial effusion. There are no enlarged mediastinal, hilar or axillary lymph nodes. The thyroid gland, trachea and esophagus  demonstrate no significant findings. Lungs/Pleura: There is trace pleural fluid bilaterally. No pneumothorax or pneumomediastinum.Dependent pulmonary opacities bilaterally are most consistent with atelectasis. There is no consolidation or endobronchial lesion. Musculoskeletal/Chest wall: No chest wall lesion or acute osseous findings. CT ABDOMEN and PELVIS FINDINGS Hepatobiliary: The liver is normal in density without focal abnormality. There is a small gallstone. There is no significant gallbladder wall thickening or biliary dilatation. There is no pneumobilia. Pancreas: Unremarkable. No pancreatic ductal dilatation or surrounding inflammatory changes. Spleen: Normal in size without focal abnormality. Adrenals/Urinary Tract: Both adrenal glands appear normal. There are multiple exophytic low-density renal lesions bilaterally which are likely all cysts. Some of these are too small to optimally characterize. No evidence of enhancing renal mass, urinary tract calculus or hydronephrosis. There is bladder wall thickening with a recent trans urethral resection defect in the prostate gland. Stomach/Bowel: The distal stomach and proximal duodenum demonstrate irregular wall thickening and surrounding inflammation. There is an extraluminal fluid collection extending posteriorly from the duodenal bulb with surrounding extraluminal fluid and air, highly suspicious for perforation of an ulcer. There is a small amount of fluid surrounding the liver and spleen as well as a moderate amount of free intraperitoneal air. Extraluminal air extends into the porta hepatis. No other bowel wall thickening or significant distention. The appendix appears normal. Vascular/Lymphatic: There are no enlarged abdominal or pelvic lymph nodes. There is diffuse atherosclerosis of the aorta, its branches and the iliac arteries. Reproductive: As above, there is a recent TUR defect within the prostate gland which is heterogeneous in density. Other: As  above, pneumoperitoneum with free fluid in the upper abdomen. No generalized ascites or focal extraluminal fluid collection identified. Musculoskeletal: No acute or significant osseous findings. Review of the MIP images confirms the above findings. IMPRESSION: 1. Pneumoperitoneum with extraluminal air and fluid surrounding the distal stomach and proximal duodenum highly suspicious for a perforated peptic ulcer. General surgical evaluation recommended. 2. No evidence of bowel obstruction or abscess. 3. No evidence of acute pulmonary embolism. 4. Small bilateral pleural effusions with mild dependent atelectasis at both lung bases. 5. Moderate diffuse atherosclerosis. 6. Cholelithiasis, bilateral renal cysts and recent postsurgical changes in the prostate gland. 7. Critical Value/emergent results were called by telephone at the time of interpretation on 02/22/2016 at 3:29 pm to Dr. Reyne Dumas , who verbally acknowledged these results. Electronically Signed   By: Richardean Sale M.D.   On: 02/22/2016 15:34   Portable Chest Xray  02/22/2016  CLINICAL DATA:  Acute respiratory failure with hypoxia EXAM: PORTABLE CHEST 1 VIEW COMPARISON:  02/22/2016 FINDINGS: Endotracheal tube 4.3 cm above the carina. Internal jugular central line on the right with tip at the cavoatrial junction. Two lead cardiac pacer noted. NG tube appears to cross the gastroesophageal junction. Mild cardiac enlargement stable. Venous congestion without edema. Mild hypoventilatory change at both lung bases. IMPRESSION: Mild bibasilar opacity most consistent with atelectasis. Electronically Signed   By: Skipper Cliche M.D.   On: 02/22/2016 21:07   Dg Chest Port 1 View  02/22/2016  CLINICAL DATA:  Onset of chest pain and shortness of breath now. EXAM: PORTABLE CHEST 1 VIEW COMPARISON:  PA and lateral chest x-ray of February 18, 2016 FINDINGS: The lungs are mildly hypoinflated. The interstitial markings are increased bilaterally. The cardiac silhouette is  enlarged and the pulmonary vascularity is engorged. The permanent pacemaker defibrillator is in  stable position. The mediastinum is normal in width. There is no pleural effusion or pneumothorax. IMPRESSION: CHF with mild pulmonary interstitial edema. There is no alveolar edema nor pneumonia. Electronically Signed   By: David  Martinique M.D.   On: 02/22/2016 08:24    Anti-infectives: Anti-infectives    Start     Dose/Rate Route Frequency Ordered Stop   02/23/16 0000  piperacillin-tazobactam (ZOSYN) IVPB 3.375 g    Comments:  Zosyn 3.375 g IV q8h for CrCl > 20 mL/min   3.375 g 12.5 mL/hr over 240 Minutes Intravenous 3 times per day 02/22/16 2351     02/22/16 2245  metroNIDAZOLE (FLAGYL) IVPB 500 mg     500 mg 100 mL/hr over 60 Minutes Intravenous 3 times per day 02/22/16 2230     02/22/16 2230  metroNIDAZOLE (FLAGYL) IVPB 500 mg  Status:  Discontinued     500 mg 100 mL/hr over 60 Minutes Intravenous Every 8 hours 02/22/16 2229 02/22/16 2230   02/22/16 1630  clindamycin (CLEOCIN) IVPB 600 mg    Comments:  Pharmacy may adjust dosing strength, interval, or rate of medication as needed for optimal therapy for the patient Send with patient on call to the OR.  Anesthesia to complete antibiotic administration <47min prior to incision per [facility].   600 mg 100 mL/hr over 30 Minutes Intravenous On call to O.R. 02/22/16 1617 02/22/16 1915   02/19/16 0430  vancomycin (VANCOCIN) 50 mg/mL oral solution 125 mg     125 mg Oral 4 times daily 02/19/16 0417 03/03/16 2159       Assessment/Plan UGI bleeding & Perforated DU POD #1 s/p Ex Lap, graham patch, and drain placement - Dr. Donne Hazel -NPO, NG tube, IVF, antiemetics, pain control -NG is not to come out until surgery clears, need to keep stomach decompressed -Await bowel function, may also need UGI to check for leak prior to feeding in 3-5 days -Mobilize and IS when able  -SCD's and start heparin/lovenox tomorrow if Hgb improving  VDRF - per  CCM AFIB, CHF, CAD, MI, ICM, HLD - per cards ABL anemia -resolving C.diff - vanc/flagyl FEN - consider TPN, but would be hesitant to start until he's had no nutrition for 7 days Disp - Continue ICU    LOS: 5 days    Nat Christen 02/23/2016, 8:47 AM Pager: [phone]  (7am - 4:30pm M-F; 7am - 11:30am Sa/Su)

## 2016-02-23 NOTE — Progress Notes (Signed)
eLink Physician-Brief Progress Note [name] DOB: Jun 11, 1946 MRN: [id number]   Date of Service  02/23/2016  HPI/Events of Note  Camera check on patient postextubation at approximately 11:42 AM. Patient breathing comfortably on nasal cannula oxygen no signs of distress. Family at bedside. Answered questions regarding surgical procedure very briefly. Patient accidentally pulled out his NG tube. Also patient had venous duplex bilaterally that showed DVT and right soleal vein as well as DVTs and left proximal tibial vein and peroneal veins. No superficial thromboses.  eICU Interventions  1. Holding on systemic anticoagulation given calf vein DVTs bbilaterally. 2. Requested that the nurse contact surgery service to discuss potential replacement of NG tube. 3. Continue close monitoring in the intensive care unit.     Intervention Category Major Interventions: Other:  Tera Partridge 02/23/2016, 4:41 PM

## 2016-02-23 NOTE — Progress Notes (Signed)
Patient was admitted to 2S from OR with perforated duodenal ulcer repair. Patient's one of the lower tooth is almost about to fall and it is stuck under his tongue with some mild bleeding. Propofol infusing at 30 mcg and the patient follows command. Vital signs were stable. Patient's central line leaked once through the tubing and had some blood loss through blood backing into the tubing and the floor. Tubing was changed and cleaned the floor and ordered CBC for hemoglobin recheck.

## 2016-02-23 NOTE — Progress Notes (Signed)
VASCULAR LAB PRELIMINARY  PRELIMINARY  PRELIMINARY  PRELIMINARY  Bilateral lower extremity venous duplex  completed.    Preliminary report:  Right:  DVT noted in the soleal vein .  No evidence of superficial thrombosis.  No Baker's cyst.  Left: DVT noted in the PTV and peroneal v.  No evidence of superficial thrombosis.  No Baker's cyst.   Markese Bloxham, RVT 02/23/2016, 3:40 PM

## 2016-02-23 NOTE — Consult Note (Addendum)
PULMONARY / CRITICAL CARE MEDICINE   Name: [name] MRN: [id number] DOB: [birth year]    ADMISSION DATE:  02/18/2016 CONSULTATION DATE:  02/22/16  REFERRING MD:  Donne Hazel  CHIEF COMPLAINT:  VDRF post op  HISTORY OF PRESENT ILLNESS:   [name] is a 70 y.o. male with PMH as outlined below. He was admitted 02/18/16 with abdominal pain, nausea, lower back pain.  He had a TURP done 02/04/16 and he felt symptoms started after that.  He has been on cipro since TURP was done. Pt with cdif related to cipro use.   On 02/18/16, he had SBP in 70's at home and felt so bad that he came to ED.  In ED, he was found to have GI bleed with elevated INR of 8.68, hypotension, anemia, a.fib  He was subsequently admitted for further evaluation and management.  On 02/22/16, he had CT of the abdomen and pelvis that revealed pneumoperitoneum suspicious for a perforated peptic ulcer.  He was seen by [facility] who took him to the OR for ex-lap.  He was found to have a perforated duodenal ulcer which was patched.  Post operatively, he returned to the ICU on the ventilator so PCCM was called for medical management.  SUBJECTIVE: On vent  VITAL SIGNS: BP 112/67 mmHg  Pulse 98  Temp(Src) 98.5 F (36.9 C) (Axillary)  Resp 15  Ht 5\' 10"  (1.778 m)  Wt 99 kg (218 lb 4.1 oz)  BMI 31.32 kg/m2  SpO2 100%  HEMODYNAMICS:    VENTILATOR SETTINGS: Vent Mode:  [-] PRVC FiO2 (%):  [40 %-50 %] 40 % Set Rate:  [15 bmp] 15 bmp Vt Set:  [590 mL] 590 mL PEEP:  [5 cmH20] 5 cmH20 Plateau Pressure:  [17 cmH20-18 cmH20] 18 cmH20  INTAKE / OUTPUT: I/O last 3 completed shifts: In: 3758.3 [P.O.:240; I.V.:2828.3; Blood:440; IV Piggyback:250] Out: 680 [Urine:650; Blood:30]   PHYSICAL EXAMINATION: General: Adult male, in NAD Neuro: Sedated.  Does follow commands. HEENT: [place]/AT. PERRL, sclerae anicteric. Cardiovascular: IRIR, no M/R/G Lungs: mild ronchi Abdomen: Midline incision with dressings C/D/I, vac in place.  BS  no Musculoskeletal: No gross deformities, no edema.  Skin: Intact, warm, no rashes.  LABS:  BMET  Recent Labs Lab 02/22/16 0932 02/22/16 2300 02/23/16 0330  NA 140 140 141  K 4.1 4.1 4.1  CL 111 110 110  CO2 21* 20* 21*  BUN 14 19 18   CREATININE 1.30* 1.52* 1.47*  GLUCOSE 130* 109* 114*    Electrolytes  Recent Labs Lab 02/22/16 0932 02/22/16 2300 02/23/16 0330  CALCIUM 8.6* 8.5* 8.5*  MG  --   --  1.8  PHOS  --   --  3.8    CBC  Recent Labs Lab 02/21/16 0705 02/23/16 0012 02/23/16 0808  WBC 7.8 10.8* 10.3  HGB 8.1* 7.4* 7.9*  HCT 26.1* 22.4* 23.9*  PLT 228 203 196    Coag's  Recent Labs Lab 02/21/16 0705 02/22/16 1607 02/23/16 0330  INR 1.46 1.61* 1.67*    Sepsis Markers  Recent Labs Lab 02/18/16 0907  LATICACIDVEN 0.83    ABG  Recent Labs Lab 02/22/16 2129  PHART 7.385  PCO2ART 36.7  PO2ART 122.0*    Liver Enzymes  Recent Labs Lab 02/21/16 0705 02/22/16 0932 02/23/16 0330  AST 16 17 45*  ALT 10* 11* 24  ALKPHOS 24* 30* 27*  BILITOT 0.7 0.6 0.5  ALBUMIN 2.7* 2.7* 2.3*    Cardiac Enzymes  Recent Labs Lab 02/22/16 0932  02/22/16 1338 02/22/16 2300  TROPONINI <0.03 <0.03 0.03    Glucose No results for input(s): GLUCAP in the last 168 hours.  Imaging Ct Angio Chest Pe W/cm &/or Wo Cm  02/22/2016  CLINICAL DATA:  Shortness of breath and chest pain. History of coronary artery disease, myocardial infarction and congestive heart failure. EXAM: CT ANGIOGRAPHY CHEST CT ABDOMEN AND PELVIS WITH CONTRAST TECHNIQUE: Multidetector CT imaging of the chest was performed using the standard protocol during bolus administration of intravenous contrast. Multiplanar CT image reconstructions and MIPs were obtained to evaluate the vascular anatomy. Multidetector CT imaging of the abdomen and pelvis was performed using the standard protocol during bolus administration of intravenous contrast. CONTRAST:  159mL OMNIPAQUE IOHEXOL 350 MG/ML  SOLN COMPARISON:  Chest radiographs 02/22/2016 and 02/18/2016. Nuclear medicine pulmonary ventilation and perfusion study 01/31/2016. FINDINGS: CTA CHEST FINDINGS Mediastinum: The pulmonary arteries are well opacified with contrast. There is no evidence of acute pulmonary embolism. There is atherosclerosis of the aorta, great vessels and coronary arteries.The heart is enlarged. There is a right subclavian pacemaker with leads in the right atrium, right ventricle and coronary sinus. There is no significant pericardial effusion. There are no enlarged mediastinal, hilar or axillary lymph nodes. The thyroid gland, trachea and esophagus demonstrate no significant findings. Lungs/Pleura: There is trace pleural fluid bilaterally. No pneumothorax or pneumomediastinum.Dependent pulmonary opacities bilaterally are most consistent with atelectasis. There is no consolidation or endobronchial lesion. Musculoskeletal/Chest wall: No chest wall lesion or acute osseous findings. CT ABDOMEN and PELVIS FINDINGS Hepatobiliary: The liver is normal in density without focal abnormality. There is a small gallstone. There is no significant gallbladder wall thickening or biliary dilatation. There is no pneumobilia. Pancreas: Unremarkable. No pancreatic ductal dilatation or surrounding inflammatory changes. Spleen: Normal in size without focal abnormality. Adrenals/Urinary Tract: Both adrenal glands appear normal. There are multiple exophytic low-density renal lesions bilaterally which are likely all cysts. Some of these are too small to optimally characterize. No evidence of enhancing renal mass, urinary tract calculus or hydronephrosis. There is bladder wall thickening with a recent trans urethral resection defect in the prostate gland. Stomach/Bowel: The distal stomach and proximal duodenum demonstrate irregular wall thickening and surrounding inflammation. There is an extraluminal fluid collection extending posteriorly from the duodenal  bulb with surrounding extraluminal fluid and air, highly suspicious for perforation of an ulcer. There is a small amount of fluid surrounding the liver and spleen as well as a moderate amount of free intraperitoneal air. Extraluminal air extends into the porta hepatis. No other bowel wall thickening or significant distention. The appendix appears normal. Vascular/Lymphatic: There are no enlarged abdominal or pelvic lymph nodes. There is diffuse atherosclerosis of the aorta, its branches and the iliac arteries. Reproductive: As above, there is a recent TUR defect within the prostate gland which is heterogeneous in density. Other: As above, pneumoperitoneum with free fluid in the upper abdomen. No generalized ascites or focal extraluminal fluid collection identified. Musculoskeletal: No acute or significant osseous findings. Review of the MIP images confirms the above findings. IMPRESSION: 1. Pneumoperitoneum with extraluminal air and fluid surrounding the distal stomach and proximal duodenum highly suspicious for a perforated peptic ulcer. General surgical evaluation recommended. 2. No evidence of bowel obstruction or abscess. 3. No evidence of acute pulmonary embolism. 4. Small bilateral pleural effusions with mild dependent atelectasis at both lung bases. 5. Moderate diffuse atherosclerosis. 6. Cholelithiasis, bilateral renal cysts and recent postsurgical changes in the prostate gland. 7. Critical Value/emergent results were called by telephone  at the time of interpretation on 02/22/2016 at 3:29 pm to Dr. Reyne Dumas , who verbally acknowledged these results. Electronically Signed   By: Richardean Sale M.D.   On: 02/22/2016 15:34   Ct Abdomen Pelvis W Contrast  02/22/2016  CLINICAL DATA:  Shortness of breath and chest pain. History of coronary artery disease, myocardial infarction and congestive heart failure. EXAM: CT ANGIOGRAPHY CHEST CT ABDOMEN AND PELVIS WITH CONTRAST TECHNIQUE: Multidetector CT imaging of the  chest was performed using the standard protocol during bolus administration of intravenous contrast. Multiplanar CT image reconstructions and MIPs were obtained to evaluate the vascular anatomy. Multidetector CT imaging of the abdomen and pelvis was performed using the standard protocol during bolus administration of intravenous contrast. CONTRAST:  165mL OMNIPAQUE IOHEXOL 350 MG/ML SOLN COMPARISON:  Chest radiographs 02/22/2016 and 02/18/2016. Nuclear medicine pulmonary ventilation and perfusion study 01/31/2016. FINDINGS: CTA CHEST FINDINGS Mediastinum: The pulmonary arteries are well opacified with contrast. There is no evidence of acute pulmonary embolism. There is atherosclerosis of the aorta, great vessels and coronary arteries.The heart is enlarged. There is a right subclavian pacemaker with leads in the right atrium, right ventricle and coronary sinus. There is no significant pericardial effusion. There are no enlarged mediastinal, hilar or axillary lymph nodes. The thyroid gland, trachea and esophagus demonstrate no significant findings. Lungs/Pleura: There is trace pleural fluid bilaterally. No pneumothorax or pneumomediastinum.Dependent pulmonary opacities bilaterally are most consistent with atelectasis. There is no consolidation or endobronchial lesion. Musculoskeletal/Chest wall: No chest wall lesion or acute osseous findings. CT ABDOMEN and PELVIS FINDINGS Hepatobiliary: The liver is normal in density without focal abnormality. There is a small gallstone. There is no significant gallbladder wall thickening or biliary dilatation. There is no pneumobilia. Pancreas: Unremarkable. No pancreatic ductal dilatation or surrounding inflammatory changes. Spleen: Normal in size without focal abnormality. Adrenals/Urinary Tract: Both adrenal glands appear normal. There are multiple exophytic low-density renal lesions bilaterally which are likely all cysts. Some of these are too small to optimally characterize. No  evidence of enhancing renal mass, urinary tract calculus or hydronephrosis. There is bladder wall thickening with a recent trans urethral resection defect in the prostate gland. Stomach/Bowel: The distal stomach and proximal duodenum demonstrate irregular wall thickening and surrounding inflammation. There is an extraluminal fluid collection extending posteriorly from the duodenal bulb with surrounding extraluminal fluid and air, highly suspicious for perforation of an ulcer. There is a small amount of fluid surrounding the liver and spleen as well as a moderate amount of free intraperitoneal air. Extraluminal air extends into the porta hepatis. No other bowel wall thickening or significant distention. The appendix appears normal. Vascular/Lymphatic: There are no enlarged abdominal or pelvic lymph nodes. There is diffuse atherosclerosis of the aorta, its branches and the iliac arteries. Reproductive: As above, there is a recent TUR defect within the prostate gland which is heterogeneous in density. Other: As above, pneumoperitoneum with free fluid in the upper abdomen. No generalized ascites or focal extraluminal fluid collection identified. Musculoskeletal: No acute or significant osseous findings. Review of the MIP images confirms the above findings. IMPRESSION: 1. Pneumoperitoneum with extraluminal air and fluid surrounding the distal stomach and proximal duodenum highly suspicious for a perforated peptic ulcer. General surgical evaluation recommended. 2. No evidence of bowel obstruction or abscess. 3. No evidence of acute pulmonary embolism. 4. Small bilateral pleural effusions with mild dependent atelectasis at both lung bases. 5. Moderate diffuse atherosclerosis. 6. Cholelithiasis, bilateral renal cysts and recent postsurgical changes in the prostate gland.  7. Critical Value/emergent results were called by telephone at the time of interpretation on 28-Feb-2016 at 3:29 pm to Dr. Reyne Dumas , who verbally  acknowledged these results. Electronically Signed   By: Richardean Sale M.D.   On: 02/28/2016 15:34   Portable Chest Xray  2016-02-28  CLINICAL DATA:  Acute respiratory failure with hypoxia EXAM: PORTABLE CHEST 1 VIEW COMPARISON:  Feb 28, 2016 FINDINGS: Endotracheal tube 4.3 cm above the carina. Internal jugular central line on the right with tip at the cavoatrial junction. Two lead cardiac pacer noted. NG tube appears to cross the gastroesophageal junction. Mild cardiac enlargement stable. Venous congestion without edema. Mild hypoventilatory change at both lung bases. IMPRESSION: Mild bibasilar opacity most consistent with atelectasis. Electronically Signed   By: Skipper Cliche M.D.   On: 02/28/16 21:07     STUDIES:  CT A / P and CTA chest 02/28/2023 > pneumoperitoneum with extraluminal air and fluid surrounding the distal stomach and proximal duodenum.  No evidence of bowel obstruction or abscess.  No PE.  CULTURES: 03/02 > C.diff POS  ANTIBIOTICS: Clinda 02/28/2023 x 1 Vanc 03/03 >  Flagyl 28-Feb-2023 > Oral vanc 3/6>>> Zosyn 3/6 >  SIGNIFICANT EVENTS: 03/02 > admitted with abd pain. 02-28-2023 > to OR for ex lap and repair of perforated duodenal ulcer.  LINES/TUBES: ETT Feb 28, 2023 > Abd vac drain Feb 28, 2023 >  DISCUSSION: 70 y.o. M admitted 03/02 with abd pain.  Found to have pneumoperitoneum 2023-02-28 so taken to OR for ex lap with repair of perforated duodenal ulcer.  Returned to ICU on vent.   ASSESSMENT / PLAN:  GASTROINTESTINAL A:   Perforated duodenal ulcer - s/p surgical repair 02-28-16 (Dr. Donne Hazel).  C.difficile colitis. GI bleed - ? Resolved as of 02/28/2023. GI prophylaxis. Nutrition. P:   Post op care per [facility]CS. Abx per ID section. SUP: Famotidine, pantoprazole ngt to keep if extubated NPO.  PULMONARY A: VDRF - s/p ex lap for repair of perforated duodenal ulcer. Lose tooth at risk asp P:   WUA, SBT cpap 5 ps5, goal 1 hr, assess rsbi Keep same rest MV Repeat pcxr in am  i consent  wife to pul tooth out  CARDIOVASCULAR A:  Hx A.fib (on warfarin), CHF, CAD, MI, ICM (EF 23%), HLD. P:  Cardiology following. Would start heparin drip in AM if OK with surgery, will d/w surgery Hold outpatient warfarin, pravachol, losartan, carvedilol, digoxin, fenofibrate, furosemide, toprol-xl, nitro.   RENAL A:   Hx CKD. Pseudohypocalcemia - corrects to 9.64. P:   NS @ 50 Assess ionized calcium. BMP in AM.  HEMATOLOGIC A:   Acute blood loss anemia - had GI bleed on admit, appears to be resolved now. Coagulopathy - received vitamin K and FFP. VTE Prophylaxis. P:  Transfuse for Hgb < 7 (though consider < 8 if becomes symptomatic). Would start heparin in AM in lieu of outpatient warfarin if OK with surgery. SCD's. CBC in AM.  INFECTIOUS A:   Peritonitis. C.difficile colitis P:   Abx as above (Vanc / flagyl) to use 10 days past empiric abdo coverage Follow cultures as above. Like to dc IV  vanc in am if no enteroc noted likely would use zosyn empiric for 5 days, please reference NEJM 4 days vs standard for all comers perf same outcomes Want to limit empiric abx as able with cdiff  ENDOCRINE A:   No acute issues.  P:   SSI.  NEUROLOGIC A:   Acute metabolic encephalopathy. Hx Depression. P:   Sedation:  Propofol gtt / Fentanyl PRN. Mandatory WUA RASS goal: 0 to -1. Daily WUA. Hold outpatient sertraline, ativan, roxicet,    Family updated: wife updated by me  Interdisciplinary Family Meeting v Palliative Care Meeting:  Due by: 03/12.  Ccm time 35 min   Lavon Paganini. Titus Mould, MD, [place] Pgr: [place] [facility]

## 2016-02-23 NOTE — Progress Notes (Signed)
Occupational Therapy Discharge Patient Details Name: [name] MRN: [id number] DOB: 16-Nov-1946 Today's Date: 02/23/2016 Time:  -     Patient discharged from OT services secondary to surgery - will need to re-order OT to resume therapy services. ( note moved to ICU 02/22/16 and now s/p surg for ulcer)  Please see latest therapy progress note for current level of functioning and progress toward goals.    Progress and discharge plan discussed with patient and/or caregiver: Patient unable to participate in discharge planning and no caregivers available  GO     Vonita Moss   OTR/L Pager: [phone] Office: [phone] .  02/23/2016, 6:52 AM

## 2016-02-23 NOTE — Procedures (Signed)
Extubation Procedure Note  Patient Details:   Name: [name] DOB: [birth year] MRN: [id number]   Airway Documentation:     Evaluation  O2 sats: stable throughout Complications: No apparent complications Patient did tolerate procedure well. Bilateral Breath Sounds: Rhonchi Suctioning: Airway Yes  Placed to 4l/min  Incentive spirometer instructed.  Revonda Standard 02/23/2016, 11:42 AM

## 2016-02-24 ENCOUNTER — Inpatient Hospital Stay (HOSPITAL_COMMUNITY): Payer: Medicare Other

## 2016-02-24 DIAGNOSIS — I9589 Other hypotension: Secondary | ICD-10-CM

## 2016-02-24 DIAGNOSIS — K265 Chronic or unspecified duodenal ulcer with perforation: Secondary | ICD-10-CM

## 2016-02-24 DIAGNOSIS — I824Y3 Acute embolism and thrombosis of unspecified deep veins of proximal lower extremity, bilateral: Secondary | ICD-10-CM

## 2016-02-24 DIAGNOSIS — A047 Enterocolitis due to Clostridium difficile: Secondary | ICD-10-CM

## 2016-02-24 DIAGNOSIS — Z95810 Presence of automatic (implantable) cardiac defibrillator: Secondary | ICD-10-CM

## 2016-02-24 LAB — HEMOGLOBIN AND HEMATOCRIT, BLOOD
HEMATOCRIT: 27.5 % — AB (ref 39.0–52.0)
HEMOGLOBIN: 9.3 g/dL — AB (ref 13.0–17.0)

## 2016-02-24 LAB — HEPARIN LEVEL (UNFRACTIONATED): Heparin Unfractionated: <0.1 IU/mL — ABNORMAL LOW (ref 0.30–0.70)

## 2016-02-24 LAB — CBC
HEMATOCRIT: 21.2 % — AB (ref 39.0–52.0)
HEMOGLOBIN: 7.1 g/dL — AB (ref 13.0–17.0)
MCH: 27.2 pg (ref 26.0–34.0)
MCHC: 33.5 g/dL (ref 30.0–36.0)
MCV: 81.2 fL (ref 78.0–100.0)
PLATELETS: 231 10*3/uL (ref 150–400)
RBC: 2.61 MIL/uL — AB (ref 4.22–5.81)
RDW: 15.6 % — ABNORMAL HIGH (ref 11.5–15.5)
WBC: 12.5 10*3/uL — AB (ref 4.0–10.5)

## 2016-02-24 LAB — COMPREHENSIVE METABOLIC PANEL
ALT: 19 U/L (ref 17–63)
ANION GAP: 10 (ref 5–15)
AST: 28 U/L (ref 15–41)
Albumin: 2 g/dL — ABNORMAL LOW (ref 3.5–5.0)
Alkaline Phosphatase: 37 U/L — ABNORMAL LOW (ref 38–126)
BUN: 17 mg/dL (ref 6–20)
CHLORIDE: 112 mmol/L — AB (ref 101–111)
CO2: 21 mmol/L — AB (ref 22–32)
Calcium: 8.4 mg/dL — ABNORMAL LOW (ref 8.9–10.3)
Creatinine, Ser: 1.51 mg/dL — ABNORMAL HIGH (ref 0.61–1.24)
GFR calc non Af Amer: 46 mL/min — ABNORMAL LOW (ref >60)
GFR, EST AFRICAN AMERICAN: 53 mL/min — AB (ref >60)
Glucose, Bld: 99 mg/dL (ref 65–99)
POTASSIUM: 4 mmol/L (ref 3.5–5.1)
SODIUM: 143 mmol/L (ref 135–145)
Total Bilirubin: 0.8 mg/dL (ref 0.3–1.2)
Total Protein: 5.1 g/dL — ABNORMAL LOW (ref 6.5–8.1)

## 2016-02-24 LAB — PREPARE RBC (CROSSMATCH)

## 2016-02-24 LAB — H. PYLORI ANTIBODY, IGG

## 2016-02-24 LAB — PROTIME-INR
INR: 1.67 — AB (ref 0.00–1.49)
Prothrombin Time: 19.7 seconds — ABNORMAL HIGH (ref 11.6–15.2)

## 2016-02-24 LAB — TRIGLYCERIDES: Triglycerides: 78 mg/dL (ref <150)

## 2016-02-24 MED ORDER — MAGNESIUM SULFATE IN D5W 10-5 MG/ML-% IV SOLN
1.0000 g | Freq: Once | INTRAVENOUS | Status: AC
  Administered 2016-02-24: 1 g via INTRAVENOUS
  Filled 2016-02-24: qty 100

## 2016-02-24 MED ORDER — HEPARIN (PORCINE) IN NACL 100-0.45 UNIT/ML-% IJ SOLN
2000.0000 [IU]/h | INTRAMUSCULAR | Status: DC
  Administered 2016-02-24: 1550 [IU]/h via INTRAVENOUS
  Administered 2016-02-25 – 2016-02-26 (×3): 1850 [IU]/h via INTRAVENOUS
  Administered 2016-02-26: 2100 [IU]/h via INTRAVENOUS
  Administered 2016-02-27 (×2): 2200 [IU]/h via INTRAVENOUS
  Administered 2016-02-29 – 2016-03-01 (×3): 2100 [IU]/h via INTRAVENOUS
  Administered 2016-03-02: 2000 [IU]/h via INTRAVENOUS
  Filled 2016-02-24 (×15): qty 250

## 2016-02-24 MED ORDER — FUROSEMIDE 10 MG/ML IJ SOLN
40.0000 mg | Freq: Once | INTRAMUSCULAR | Status: AC
  Administered 2016-02-24: 40 mg via INTRAVENOUS
  Filled 2016-02-24: qty 4

## 2016-02-24 MED ORDER — TRACE MINERALS CR-CU-MN-SE-ZN 10-1000-500-60 MCG/ML IV SOLN
INTRAVENOUS | Status: AC
  Administered 2016-02-24: 18:00:00 via INTRAVENOUS
  Filled 2016-02-24: qty 960

## 2016-02-24 MED ORDER — SODIUM CHLORIDE 0.9 % IV SOLN
INTRAVENOUS | Status: DC
  Administered 2016-02-24: 10 mL via INTRAVENOUS

## 2016-02-24 MED ORDER — FENTANYL CITRATE (PF) 100 MCG/2ML IJ SOLN
25.0000 ug | INTRAMUSCULAR | Status: DC | PRN
  Administered 2016-02-24 – 2016-02-28 (×18): 50 ug via INTRAVENOUS
  Filled 2016-02-24 (×17): qty 2

## 2016-02-24 MED ORDER — SODIUM CHLORIDE 0.9 % IV SOLN
Freq: Once | INTRAVENOUS | Status: AC
  Administered 2016-02-24: 13:00:00 via INTRAVENOUS

## 2016-02-24 MED ORDER — HEPARIN BOLUS VIA INFUSION
2000.0000 [IU] | Freq: Once | INTRAVENOUS | Status: AC
  Administered 2016-02-24: 2000 [IU] via INTRAVENOUS
  Filled 2016-02-24: qty 2000

## 2016-02-24 MED ORDER — FAT EMULSION 20 % IV EMUL
240.0000 mL | INTRAVENOUS | Status: AC
  Administered 2016-02-24: 240 mL via INTRAVENOUS
  Filled 2016-02-24: qty 250

## 2016-02-24 MED ORDER — SODIUM CHLORIDE 0.9% FLUSH
10.0000 mL | INTRAVENOUS | Status: DC | PRN
  Administered 2016-03-02: 10 mL
  Filled 2016-02-24: qty 40

## 2016-02-24 MED ORDER — IPRATROPIUM-ALBUTEROL 0.5-2.5 (3) MG/3ML IN SOLN: 3.0000 mL | RESPIRATORY_TRACT | Status: DC | PRN

## 2016-02-24 MED ORDER — INSULIN ASPART 100 UNIT/ML ~~LOC~~ SOLN
0.0000 [IU] | Freq: Four times a day (QID) | SUBCUTANEOUS | Status: DC
  Administered 2016-02-24 – 2016-02-25 (×3): 1 [IU] via SUBCUTANEOUS
  Administered 2016-02-25: 2 [IU] via SUBCUTANEOUS
  Administered 2016-02-26 – 2016-02-27 (×4): 1 [IU] via SUBCUTANEOUS

## 2016-02-24 MED ORDER — SODIUM CHLORIDE 0.9% FLUSH
10.0000 mL | Freq: Two times a day (BID) | INTRAVENOUS | Status: DC
  Administered 2016-02-24 – 2016-02-25 (×2): 10 mL
  Administered 2016-02-27: 20 mL
  Administered 2016-02-27: 10 mL
  Administered 2016-02-28: 20 mL
  Administered 2016-02-28 – 2016-03-01 (×3): 10 mL

## 2016-02-24 NOTE — Progress Notes (Signed)
PT Cancellation Note  Patient Details Name: [name] MRN: [id number] DOB: February 12, 1946   Cancelled Treatment:    Reason Eval/Treat Not Completed: Patient not medically ready Holding PT evaluation as pt not medically ready. Found to have acute DVT BLEs. Awaiting start of Heparin drip. Will follow up in 24 hours per protocol to perform PT evaluation.   Marguarite Arbour A Jeptha Hinnenkamp 02/24/2016, 9:10 AM Wray Kearns, [place], DPT [phone]

## 2016-02-24 NOTE — Progress Notes (Signed)
PULMONARY / CRITICAL CARE MEDICINE   Name: [name] MRN: [id number] DOB: April 10, 1946    ADMISSION DATE:  02/18/2016 CONSULTATION DATE:  02/22/2016  REFERRING MD:  [place]  CHIEF COMPLAINT:  Dizziness  SUBJECTIVE:  Feels thirsty.  VITAL SIGNS: BP 106/59 mmHg  Pulse 112  Temp(Src) 99.2 F (37.3 C) (Oral)  Resp 25  Ht 5\' 10"  (1.778 m)  Wt 216 lb 11.4 oz (98.3 kg)  BMI 31.09 kg/m2  SpO2 93%  INTAKE / OUTPUT: I/O last 3 completed shifts: In: 5351.3 [I.V.:3921.3; Blood:440; NG/GT:90; IV Piggyback:900] Out: [id number] [Urine:1920; Emesis/NG output:450; Drains:140; Blood:30]  PHYSICAL EXAMINATION: General:  alert Neuro:  Normal strength, follows commands HEENT: NG in, no stridor Cardiovascular: regular, no murmur Lungs: no wheeze Abdomen: wound dressing clean Musculoskeletal: no edema Skin: no rashes  LABS:  BMET  Recent Labs Lab 02/22/16 2300 02/23/16 0330 02/24/16 0450  NA 140 141 143  K 4.1 4.1 4.0  CL 110 110 112*  CO2 20* 21* 21*  BUN 19 18 17   CREATININE 1.52* 1.47* 1.51*  GLUCOSE 109* 114* 99    Electrolytes  Recent Labs Lab 02/22/16 2300 02/23/16 0330 02/24/16 0450  CALCIUM 8.5* 8.5* 8.4*  MG  --  1.8  --   PHOS  --  3.8  --     CBC  Recent Labs Lab 02/23/16 0012 02/23/16 0808 02/24/16 0846  WBC 10.8* 10.3 12.5*  HGB 7.4* 7.9* 7.1*  HCT 22.4* 23.9* 21.2*  PLT 203 196 231    Coag's  Recent Labs Lab 02/22/16 1607 02/23/16 0330 02/24/16 0450  INR 1.61* 1.67* 1.67*    Sepsis Markers  Recent Labs Lab 02/18/16 0907  LATICACIDVEN 0.83    ABG  Recent Labs Lab 02/22/16 2129 02/23/16 1124  PHART 7.385 7.440  PCO2ART 36.7 31.8*  PO2ART 122.0* 193.0*    Liver Enzymes  Recent Labs Lab 02/22/16 0932 02/23/16 0330 02/24/16 0450  AST 17 45* 28  ALT 11* 24 19  ALKPHOS 30* 27* 37*  BILITOT 0.6 0.5 0.8  ALBUMIN 2.7* 2.3* 2.0*    Cardiac Enzymes  Recent Labs Lab 02/22/16 0932 02/22/16 1338 02/22/16 2300   TROPONINI <0.03 <0.03 0.03    Glucose No results for input(s): GLUCAP in the last 168 hours.  Imaging Dg Abd 1 View  02/24/2016  CLINICAL DATA:  New NG tube position. EXAM: ABDOMEN - 1 VIEW COMPARISON:  02/18/2016 FINDINGS: Enteric tube tip is in the left upper quadrant consistent with location in the upper stomach. Proximal side hole is projected just below the expected location of the EG junction. Left pleural effusion. IMPRESSION: Enteric tube tip is position in the left upper quadrant consistent with location in the upper stomach. Electronically Signed   By: Lucienne Capers M.D.   On: 02/24/2016 06:01   Dg Chest Port 1 View  02/24/2016  CLINICAL DATA:  Acute respiratory failure with hypoxia, CHF, coronary artery disease, former smoker. EXAM: PORTABLE CHEST 1 VIEW COMPARISON:  Portable chest x-ray of February 22, 2016 FINDINGS: The endotracheal tube is not clearly evident and may have been removed. The lungs are mildly hypoinflated. Persistent increased density at both lung bases is demonstrated. There is further obscuration of the left hemidiaphragm. The cardiac silhouette is enlarged. The central pulmonary vascularity is prominent. The right internal jugular venous catheter tip projects over the midportion of the SVC. The esophagogastric tubes proximal port lies at or just below the GE junction. The pacemaker-defibrillator electrodes are in stable position. IMPRESSION: Probable  extubation of the trachea. Persistent mild hypoinflation with worsening bibasilar atelectasis or pneumonia on the left. A small amount of pleural fluid is likely present at both lung bases. Advancement of the nasogastric tube by 10 cm is recommended to assure that the proximal port remains below the GE junction. Electronically Signed   By: David  Martinique M.D.   On: 02/24/2016 07:31     STUDIES:  03-09-23 CT chest >> no PE 2023/03/09 CT abd/pelvis >> pneumoperitoneum 3/07 Doppler legs b/l >> DVT Rt soleal vein, DVT Lt peroneal  vein  CULTURES: 3/02 C diff PCR >> positive  ANTIBIOTICS: 3/02 Enteral vancomycin >> 03-09-2023 Zosyn >> 2023-03-09 Flagyl >> 3/08  SIGNIFICANT EVENTS: 3/02 Admit 03/09/23 Pneumoperitoneum on CT abd >> Surgery consulted; cardiology consulted 3/08 start heparin gtt  LINES/TUBES: 03/09/23 ETT >> 3/07  03/09/2023 Rt IJ CVL >> 03-09-2023 Lt radial aline >> 3/08  DISCUSSION: 70 yo male presented with dizziness, hypotension, abdominal pain, anemia from GI bleeding with supratherapeutic INR.  Found to have pneumoperitoneum from perforated duodenal ulcer, and had emergent laparotomy.    ASSESSMENT / PLAN:  PULMONARY A: Acute respiratory failure. P:   Oxygen to keep SpO2 > 92% Bronchial hygiene Change BD's to prn  CARDIOVASCULAR A:  Hypotension 2nd to acute blood loss from GI bleed. Chronic systolic CHF s/p AICD >> EF 20 to 25% from Echo 01/06/16. Hx of A fib, CAD, HLD. B/l lower extremity DVT noted 3/07. P:  Continue IV lopressor Okay per surgery to start heparin gtt w/o bolus 3/08  RENAL A:   CKD stage 3. S/p TURP 01/25/16. P:   Monitor renal fx, urine outpt  GASTROINTESTINAL A:   GI bleed from perforated duodenal ulcer s/p Phillip Heal patch March 09, 2023. P:   High protonix gtt per surgery TNA/nutrition, wound care per surgery  HEMATOLOGIC A:   Anemia from GI bleed and critical illness. P:  F/u CBC Transfuse for Hb < 7 SCD's  INFECTIOUS A:   C diff colitis. Peritonitis 2nd to perforated duodenal ulcer P:   Day 3 of zosyn D/c flagyl 3/08 Okay per surgery to continue enteral vancomycin  ENDOCRINE A:   Hyperglycemia. P:   SSI while on TNA  NEUROLOGIC A:   Post-op pain control. Hx of depression. P:   Prn fentanyl  Updated pt's son at bedside.  CC time 39 minutes.  Chesley Mires, MD [facility] Pulmonary/Critical Care 02/24/2016, 10:34 AM Pager:  [phone] After 3pm call: [phone]

## 2016-02-24 NOTE — Consult Note (Signed)
WOC wound consult note Reason for Consult:Consult requested for abd Vac dressing change.  Emina, PA for surgical team at bedside to assess the wound during the first post-op dressing change. Wound type: Full thickness midline abd wound Measurement: 11X3X.3cm Wound bed: Beefy red Drainage (amount, consistency, odor) Small amt red drainage, no odor Periwound: Intact skin surrounding Dressing procedure/placement/frequency: Applied one piece black foam to 158mm cont suction.  Pt medicated for pain prior to procedure and tolerated with minimal amt discomfort.  Plan for bedside nurse to change dressing Q M/W/F. Please re-consult if further assistance is needed.  Thank-you,  Julien Girt MSN, [place], [place], [place], [place]

## 2016-02-24 NOTE — Progress Notes (Signed)
PARENTERAL NUTRITION CONSULT NOTE - INITIAL  Pharmacy Consult:  TPN Indication:  Prolonged ileus  Allergies  Allergen Reactions  . Keflex [Cephalexin] Shortness Of Breath and Other (See Comments)    dizziness    Patient Measurements: Height: 5' 10" (177.8 cm) Weight: 216 lb 11.4 oz (98.3 kg) IBW/kg (Calculated) : 73  Vital Signs: Temp: 99.2 F (37.3 C) (03/08 0400) Temp Source: Oral (03/08 0400) BP: 106/59 mmHg (03/08 0900) Pulse Rate: 112 (03/08 0900) Intake/Output from previous day: 03/07 0701 - 03/08 0700 In: 1833 [I.V.:1093; NG/GT:90; IV Piggyback:650] Out: 2110 [Urine:1520; Emesis/NG output:450; Drains:140] Intake/Output from this shift: Total I/O In: 306.7 [I.V.:306.7] Out: 150 [Urine:150]  Labs:  Recent Labs  02/22/16 1607 02/23/16 0012 02/23/16 0330 02/23/16 0808 02/24/16 0450  WBC  --  10.8*  --  10.3  --   HGB  --  7.4*  --  7.9*  --   HCT  --  22.4*  --  23.9*  --   PLT  --  203  --  196  --   INR 1.61*  --  1.67*  --  1.67*     Recent Labs  02/22/16 0932 02/22/16 2300 02/23/16 0330 02/24/16 0450  NA 140 140 141 143  K 4.1 4.1 4.1 4.0  CL 111 110 110 112*  CO2 21* 20* 21* 21*  GLUCOSE 130* 109* 114* 99  BUN 14 19 18 17  CREATININE 1.30* 1.52* 1.47* 1.51*  CALCIUM 8.6* 8.5* 8.5* 8.4*  MG  --   --  1.8  --   PHOS  --   --  3.8  --   PROT 5.3*  --  5.5* 5.1*  ALBUMIN 2.7*  --  2.3* 2.0*  AST 17  --  45* 28  ALT 11*  --  24 19  ALKPHOS 30*  --  27* 37*  BILITOT 0.6  --  0.5 0.8  TRIG  --   --   --  78   Estimated Creatinine Clearance: 55 mL/min (by C-G formula based on Cr of 1.51).   No results for input(s): GLUCAP in the last 72 hours.  Medical History: Past Medical History  Diagnosis Date  . Coronary artery disease   . MI (myocardial infarction) (HCC) 2000    ANTERIOR, s/p PCI  . Ischemic cardiomyopathy     EF 23%  . Persistent atrial fibrillation (HCC)     on coumadin  . Chronic renal insufficiency   . Dyslipidemia   .  Depression   . ED (erectile dysfunction)   . Other primary cardiomyopathies   . Presence of permanent cardiac pacemaker   . AICD (automatic cardioverter/defibrillator) present   . Dysrhythmia     atrial fibrillation  . CHF (congestive heart failure) (HCC)   . Shortness of breath dyspnea     on exertion      Insulin Requirements in the past 24 hours:  Not on SSI  Assessment: 69 YOM presented on 02/18/16 with hypotension and dizziness from GIB.  Found to have perforated duodenum and was taken to the OR on 02/22/16 for ex-lap with Graham patch of duodenal ulcer.  Pharmacy consulted to initiate TPN for nutritional support given expected prolonged bowel rest.  GI: admitted with GIB on Pepcid IV + PPI IV Endo: TSH WNL.  No hx DM - AM glucose WNL Lytes: mildly elevated CL and low CO2, Mag 1.8 (goal >/= 2 for ileus) Renal: CKD3, s/p recent TURP (2/6) - SCr up 1.51, BUN    WNL - NS at 50 ml/hr Pulm: extubated 3/7 to RA - Duonebs Cards: CAD / ICM / AFib / DL / AICD / CHF - BP soft, tachy - Lopressor IV AC: Coumadin PTA for hx AFib >> held d/t GIB.  New DVT on 3/7 - hgb 7.1, plts WNL, INR 1.67 (anemia panel WNL) Hepatobil: mild elevated alk phos, others WNL.  TG WNL. Neuro: depression - A&O, pain score 0-5, PRN Fentanyl available ID: Flagyl/PO Vanc for C.diff colitis + Zosyn for intra-abd infxn, long course of Cipro PTA - afebrile, WBC 12.5 Best Practices: no VTE px in setting of GIB TPN Access: right IJ placed 02/22/16 TPN start date: 02/24/16  Current Nutrition:  NPO (off Propofol)  Nutritional Goals:  2000-2200 kCal, 115-130 grams of protein per day   Plan:  - Initiate Clinimix E 5/15 at 40 ml/hr and ILE 20% at 10 ml/hr - Daily multivitamin and trace elements in TPN - Start sensitive SSI.  D/C if CBGs remain controlled at goal TPN rate. - Mag sulfate 1gm IV x 1 - Reduce IVF to 10 ml/hr once TPN starts - Add folate to TPN tomorrow - Standard TPN labs in AM   Rollie Hynek D. Mina Marble, PharmD,  BCPS Pager:  [phone] 02/24/2016, 9:41 AM

## 2016-02-24 NOTE — Progress Notes (Signed)
2 Days Post-Op  Subjective: Extubated.  Alert.  Conversing well.  Slightly disoriented and confused but not agitated. SPO2 97% on room air.  Hemodynamically stable off pressors.  Good urine output.  NG had to be replaced by nursing staff last night.  Abdominal x-ray was ordered but I do not see that.  I have requested this be re-done now  Only labs back at present is INR of 1.67.  Lower extremity duplex ultrasound shows DVT in the right soleal vein and left peroneal vein. I agreed that he will need to be placed on heparin drip to prevent propagation.  High risk for this Suggest starting heparin drip without bolus this morning if hemoglobin stable.  Objective: Vital signs in last 24 hours: Temp:  [98.5 F (36.9 C)-99.6 F (37.6 C)] 99.2 F (37.3 C) (03/08 0400) Pulse Rate:  [94-112] 106 (03/08 0500) Resp:  [10-38] 21 (03/08 0500) BP: (86-129)/(44-80) 115/66 mmHg (03/08 0400) SpO2:  [91 %-100 %] 97 % (03/08 0500) Arterial Line BP: (93-143)/(38-69) 105/48 mmHg (03/08 0500) FiO2 (%):  [40 %] 40 % (03/07 1030) Weight:  [98.3 kg (216 lb 11.4 oz)] 98.3 kg (216 lb 11.4 oz) (03/08 0400) Last BM Date: 02/21/16  Intake/Output from previous day: 03/07 0701 - 03/08 0700 In: 1833 [I.V.:1093; NG/GT:90; IV Piggyback:650] Out: 1995 [id number]; Emesis/NG output:450; Drains:125] Intake/Output this shift: Total I/O In: 520 [I.V.:320; IV Piggyback:200] Out: 885 [Urine:620; Emesis/NG output:250; Drains:15]  General appearance: Alert.  Cooperative.  Not agitated.  Slightly confused.  Asks same question multiple times.  Color good Resp: clear to auscultation bilaterally GI: Abdomen soft.  Appropriately tender.  Negative pressure dressing clean.  Serosanguineous drainage.  No active bleeding.  JP drain is serosanguineous.  Nonbilious.  Nonenteric.  Lab Results:  Results for orders placed or performed during the hospital encounter of 02/18/16 (from the past 24 hour(s))  CBC     Status: Abnormal   Collection Time: 02/23/16  8:08 AM  Result Value Ref Range   WBC 10.3 4.0 - 10.5 K/uL   RBC 2.95 (L) 4.22 - 5.81 MIL/uL   Hemoglobin 7.9 (L) 13.0 - 17.0 g/dL   HCT 23.9 (L) 39.0 - 52.0 %   MCV 81.0 78.0 - 100.0 fL   MCH 26.8 26.0 - 34.0 pg   MCHC 33.1 30.0 - 36.0 g/dL   RDW 15.7 (H) 11.5 - 15.5 %   Platelets 196 150 - 400 K/uL  I-STAT 3, arterial blood gas (G3+)     Status: Abnormal   Collection Time: 02/23/16 11:24 AM  Result Value Ref Range   pH, Arterial 7.440 7.350 - 7.450   pCO2 arterial 31.8 (L) 35.0 - 45.0 mmHg   pO2, Arterial 193.0 (H) 80.0 - 100.0 mmHg   Bicarbonate 21.6 20.0 - 24.0 mEq/L   TCO2 23 0 - 100 mmol/L   O2 Saturation 100.0 %   Acid-base deficit 2.0 0.0 - 2.0 mmol/L   Patient temperature HIDE    Collection site ARTERIAL LINE    Sample type ARTERIAL   Protime-INR     Status: Abnormal   Collection Time: 02/24/16  4:50 AM  Result Value Ref Range   Prothrombin Time 19.7 (H) 11.6 - 15.2 seconds   INR 1.67 (H) 0.00 - 1.49     Studies/Results: No results found.  Marland Kitchen antiseptic oral rinse  7 mL Mouth Rinse QID  . chlorhexidine gluconate  15 mL Mouth Rinse BID  . famotidine (PEPCID) IV  20 mg Intravenous Q12H  .  folic acid  1 mg Intravenous QHS  . ipratropium-albuterol  3 mL Nebulization QID  . metoprolol  2.5 mg Intravenous 4 times per day  . metronidazole  500 mg Intravenous 3 times per day  . pantoprazole (PROTONIX) IV  80 mg Intravenous Q12H  . piperacillin-tazobactam (ZOSYN)  IV  3.375 g Intravenous 3 times per day  . vancomycin  125 mg Oral QID     Assessment/Plan: s/p Procedure(s): EXPLORATORY LAPAROTOMY WITH  PATCH OF DUODENAL ULCER  UGI bleeding & Perforated DU POD #2 s/p Ex Lap, graham patch, and drain placement - Dr. Donne Hazel -NPO, NG tube, IVF, antiemetics, pain contro-Pepcid infusion plus Protonix. -H pylori antibody in process. -NG is not to come out until surgery clears, need to keep stomach decompressed -Check abdominal x-ray stat to  assess NG position -Await bowel function, may also need UGI to check for leak prior to feeding in 5-6 days -Phillip Heal patch closure is a little tenuous, large ulcer.  Will initiate TNA nail.  PICC line requested -Mobilize and IS when able  -PT consult requested -WOC consult requested for [facility] changes Monday Wednesday Friday  Acute DVT-if hemoglobin stable this morning, would start heparin drip without bolus.-  VDRF - off ventilator AFIB, CHF, CAD, MI, ICM, HLD - per cards ABL anemia -resolving C.diff - vanc/flagyl FEN - after discussion of operative events, anticipate prolonged bowel rest and will start TNA now Disp - Continue ICU  @PROBHOSP @  LOS: 6 days    Atonya Templer M 02/24/2016  . .prob

## 2016-02-24 NOTE — Progress Notes (Signed)
[name] Date of Encounter: 02/24/2016  Hospital Problem List     Principal Problem:   Symptomatic anemia Active Problems:   Chronic systolic CHF (congestive heart failure) (HCC)   Biventricular implantable cardioverter-defibrillator Medtronic   Supratherapeutic INR   Cardiomyopathy, ischemic-EF 20-25%   C. difficile colitis   Duodenal ulcer with perforation (HCC)   Chronic a-fib (HCC)   Chronic anticoagulation-Couamdin   CAD S/P LAD PCI- 2000   BPH- s/p TURP 01/25/16 - followed by 4 weeks of Cipro   GI bleed   Hypotension   S/P TURP   CKD (chronic kidney disease) stage 3, GFR 30-59 ml/min   Epigastric pain   Acute respiratory failure with hypoxia (HCC)   Perforated ulcer ([place])   Abdominal pain   Admitted with Abd pain & fatigue - noted to be Anemic with supratherapeutic INR. Noted to have C-diff colitis, but on CT scan yesterday noted to have Duodenal Ulcer -- s/p Ex Lap & ulcer closure.   Subjective   Intubated.  On weaning protocol - starting to arouse. Notably uncomfortable.    Inpatient Medications    . antiseptic oral rinse  7 mL Mouth Rinse QID  . chlorhexidine gluconate  15 mL Mouth Rinse BID  . folic acid  1 mg Intravenous QHS  . insulin aspart  0-9 Units Subcutaneous 4 times per day  . metoprolol  2.5 mg Intravenous 4 times per day  . pantoprazole (PROTONIX) IV  80 mg Intravenous Q12H  . piperacillin-tazobactam (ZOSYN)  IV  3.375 g Intravenous 3 times per day  . vancomycin  125 mg Oral QID    Vital Signs    Filed Vitals:   02/24/16 0700 02/24/16 0800 02/24/16 0808 02/24/16 0900  BP: 93/59 93/59  106/59  Pulse: 105 108  112  Temp:      TempSrc:      Resp: 15 22  25   Height:      Weight:      SpO2: 92% 97% 95% 93%    Intake/Output Summary (Last 24 hours) at 02/24/16 1159 Last data filed at 02/24/16 0800  Gross per 24 hour  Intake 1516.67 ml  Output   1910 ml  Net -393.33 ml   Filed Weights   02/22/16 0500 02/23/16 0422  02/24/16 0400  Weight: 311 lb 9.6 oz (141.341 kg) 218 lb 4.1 oz (99 kg) 216 lb 11.4 oz (98.3 kg)    Physical Exam    General: Pleasant, ill appearing, but in good spirits. Neuro: Alert and oriented X 3. Moves all extremities spontaneously. Psych: Normal affect. HEENT:  Normal  Neck: Supple without bruits or JVD. Lungs:  Resp regular and unlabored, coarse BS t/o, but no obvious rales. Heart: Irreg-Irreg ~ borderline tachycardic. no s3, s4, or murmurs. Abdomen: Soft, non-tender, non-distended, BS + x 4.  Extremities: No clubbing, cyanosis or edema. DP/PT/Radials 2+ and equal bilaterally.  Labs    CBC  Recent Labs  02/23/16 0808 02/24/16 0846  WBC 10.3 12.5*  HGB 7.9* 7.1*  HCT 23.9* 21.2*  MCV 81.0 81.2  PLT 196 [id number]   Basic Metabolic Panel  Recent Labs  02/23/16 0330 02/24/16 0450  NA 141 143  K 4.1 4.0  CL 110 112*  CO2 21* 21*  GLUCOSE 114* 99  BUN 18 17  CREATININE 1.47* 1.51*  CALCIUM 8.5* 8.4*  MG 1.8  --   PHOS 3.8  --    Liver Function Tests  Recent Labs  02/23/16  0330 02/24/16 0450  AST 45* 28  ALT 24 19  ALKPHOS 27* 37*  BILITOT 0.5 0.8  PROT 5.5* 5.1*  ALBUMIN 2.3* 2.0*    Recent Labs  02/22/16 0932  LIPASE 87*   Cardiac Enzymes  Recent Labs  02/22/16 0932 02/22/16 1338 02/22/16 2300  TROPONINI <0.03 <0.03 0.03   BNP Invalid input(s): POCBNP D-Dimer  Recent Labs  02/22/16 0932  DDIMER 1.60*   Hemoglobin A1C No results for input(s): HGBA1C in the last 72 hours. Fasting Lipid Panel  Recent Labs  02/24/16 0450  TRIG 78   Thyroid Function Tests No results for input(s): TSH, T4TOTAL, T3FREE, THYROIDAB in the last 72 hours.  Invalid input(s): FREET3  Telemetry    Afib with PVCs in couplets & triplets.  Intermittently paced rates mostly in 90s until agitated  ECG    n/a  Radiology    CXR: IMPRESSION: Probable extubation of the trachea. Persistent mild hypoinflation with worsening bibasilar atelectasis or  pneumonia on the left. A small amount of pleural fluid is likely present at both lung bases. Advancement of the nasogastric tube by 10 cm is recommended to assure that the proximal port remains below the GE junction.   Assessment & Plan    Symptomatic anemia /GI bleed- Hgb 7.9  - now 7.1post -op.  Would transfuse 2Units PRBC given Cardiomyopathy -- would follow with Lasix.    Cardiomyopathy, ischemic-EF [id number] Chronic systolic CHF (congestive heart failure) (HCC) - no active HF exacerbation Sx. CXR looks clear.   Avoid XS volume load. -- Will need standing diuretic dosing. ~20 mg IV daily while NPO.    Biventricular implantable cardioverter-defibrillator Medtronic   Supratherapeutic INR - reversed.    Chronic a-fib ([place]) / Chronic anticoagulation-Couamdin held for surgery & GIB.    Rate still in 90-110 range - likely driven by discomfort & anemia.  Added Metoprolol to IV 2.5 mg q6hr for now. -- will need to clarify his home regimen (has both Cored & Toprol listed)  IV Heparin started 2/2 Popliteal DVT - would continue until able to restart warfarin & complete bridge. (could use Lovenox if no active bleed on IV Heparin)  Transfusion to increase Hgb will help with rate control.  CAD S/P LAD PCI- 2000 - no antecedent angina.  Would expect a low Troponin elevation with existing CAD & Cardiomyopathy, but would simply continue home Rx as best possible.   C. difficile colitis - per PCCM    Duodenal ulcer with perforation ([place]) - s/p repair yesterday.  Plan is TPN to start ~tomorrow -- would ensure that we use concentrated feeds to aviod volume overload.     BPH- s/p TURP 01/25/16 - followed by 4 weeks of Cipro      Hypotension - stable; tolerating IV BB.    CKD (chronic kidney disease) stage 3, GFR 30-59 ml/min: stable      Acute respiratory failure with hypoxia (HCC) - Intubated for surgery with slow wean from Vent today.     Signed, Leonie Man, M.D., M.S. Interventional  Cardiologist   Pager # [phone] Phone # [phone] [address] [place], [place] [address]

## 2016-02-24 NOTE — Progress Notes (Signed)
Peripherally Inserted Central Catheter/Midline Placement  The IV Nurse has discussed with the patient and/or persons authorized to consent for the patient, the purpose of this procedure and the potential benefits and risks involved with this procedure.  The benefits include less needle sticks, lab draws from the catheter and patient may be discharged home with the catheter.  Risks include, but not limited to, infection, bleeding, blood clot (thrombus formation), and puncture of an artery; nerve damage and irregular heat beat.  Alternatives to this procedure were also discussed.  Consent obtained by Jule Economy, RN  PICC/Midline Placement Documentation        [name], [name] 02/24/2016, 12:21 PM

## 2016-02-24 NOTE — Progress Notes (Signed)
Nutrition Consult/Follow Up  DOCUMENTATION CODES:   Obesity unspecified  INTERVENTION:    TPN per pharmacy   NUTRITION DIAGNOSIS:   Inadequate oral intake related to inability to eat as evidenced by NPO status, ongoing  GOAL:   Patient will meet greater than or equal to 90% of their needs, progressing  MONITOR:   Diet advancement, Labs, Weight trends, Skin, I & O's, TPN prescription  ASSESSMENT:   70 yo Male with PMH of CHF, CAD, MI, AICD. Status post TURP to 617 on Cipro since. Admitted 02/18/2016 with low-grade abdominal pain nausea. Found to be C. difficile PCR positive. Has had melena recently. CT scan shows low volume but clear-cut pneumoperitoneum, extraluminal air and fluid surrounding the distal stomach suspicious for perforated duodenal ulcer. No evidence of obstruction or abscess. No evidence of pulmonary embolism.-No significant bowel wall thickening. No radiographic evidence of acute colitis..  Patient s/p procedure 3/6: GRAHAM PATCH PERFORATED DUODENAL ULCER  Patient extubated 3/7.  Propofol discontinued.  CWOCN note 3/8 reviewed.  Pt with full thickness midline abd wound.  RD consulted for new TPN.  Plan is for prolonged bowel rest.  Patient is receiving TPN via CVC with Clinimix E 5/15 @ 40 ml/hr and lipids @ 10 ml/hr.  Provides 1162 kcal and 48 grams protein per day. Meets 58% minimum estimated energy needs and 40% minimum estimated protein needs.  Diet Order:  Diet NPO time specified TPN (CLINIMIX-E) Adult  Skin:  Wound (see comment) (abdominal wound VAC)  Last BM:  3/5  Height:   Ht Readings from Last 1 Encounters:  02/18/16 5\' 10"  (1.778 m)    Weight:   Wt Readings from Last 1 Encounters:  02/24/16 216 lb 11.4 oz (98.3 kg)    Ideal Body Weight:  75.4 kg  BMI:  Body mass index is 31.09 kg/(m^2).  Estimated Nutritional Needs:   Kcal:  2000-2200  Protein:  120-130 gm  Fluid:  per MD  EDUCATION NEEDS:   No education needs  identified at this time  Arthur Holms, RD, LDN Pager #: [phone] After-Hours Pager #: [phone]

## 2016-02-24 NOTE — Progress Notes (Addendum)
   NGT advanced.  Wound VAC changed.  Wound is clean, superficial.  Can likely change to wet to dry dressing changes at discharge.  Okay to start heparin drip from a surgical standpoint, no bolus.  Suyash Amory, ANP-BC

## 2016-02-24 NOTE — Progress Notes (Signed)
ANTICOAGULATION CONSULT NOTE - Initial Consult  Pharmacy Consult for heparin Indication: new DVT, history of afib  Allergies  Allergen Reactions  . Keflex [Cephalexin] Shortness Of Breath and Other (See Comments)    dizziness    Patient Measurements: Height: 5\' 10"  (177.8 cm) Weight: 216 lb 11.4 oz (98.3 kg) IBW/kg (Calculated) : 73 Heparin Dosing Weight: 93.1kg  Vital Signs: Temp: 99.2 F (37.3 C) (03/08 0400) Temp Source: Oral (03/08 0400) BP: 106/59 mmHg (03/08 0900) Pulse Rate: 112 (03/08 0900)  Labs:  Recent Labs  02/22/16 0932 02/22/16 1338 02/22/16 1607 02/22/16 2300  02/23/16 0012 02/23/16 0330 02/23/16 0808 02/24/16 0450 02/24/16 0846  HGB  --   --   --   --   < > 7.4*  --  7.9*  --  7.1*  HCT  --   --   --   --   --  22.4*  --  23.9*  --  21.2*  PLT  --   --   --   --   --  203  --  196  --  231  LABPROT  --   --  19.2*  --   --   --  19.7*  --  19.7*  --   INR  --   --  1.61*  --   --   --  1.67*  --  1.67*  --   CREATININE 1.30*  --   --  1.52*  --   --  1.47*  --  1.51*  --   TROPONINI <0.03 <0.03  --  0.03  --   --   --   --   --   --   < > = values in this interval not displayed.  Estimated Creatinine Clearance: [name] mL/min (by C-G formula based on Cr of 1.51).   Medical History: Past Medical History  Diagnosis Date  . Coronary artery disease   . MI (myocardial infarction) ([place]) 2000    ANTERIOR, s/p PCI  . Ischemic cardiomyopathy     EF 23%  . Persistent atrial fibrillation (HCC)     on coumadin  . Chronic renal insufficiency   . Dyslipidemia   . Depression   . ED (erectile dysfunction)   . Other primary cardiomyopathies   . Presence of permanent cardiac pacemaker   . AICD (automatic cardioverter/defibrillator) present   . Dysrhythmia     atrial fibrillation  . CHF (congestive heart failure) ([place])   . Shortness of breath dyspnea     on exertion    Assessment: [name] with hx afib on warf, now w/ new bilateral LE DVTs on dopplers.  Warfarin held on admit for GIB + surgery for perforated duodenal ulcer. S/p OR for laparotomy. INR 8.68 on admit - reversed, now 1.67 this AM.  To start heparin for new DVT/afib hx (no bolus). Hgb down to 7.1, plt ok, no bleed.  Goal of Therapy:  Heparin level 0.3-0.7 units/ml Monitor platelets by anticoagulation protocol: Yes   Plan:  Heparin at 1550 units/h (no bolus) 6h HL, Daily HL/CBC Monitor s/sx bleeding Warfarin on hold for now  Elicia Lamp, PharmD, [facility] Clinical Pharmacist Pager [phone] 02/24/2016 10:37 AM

## 2016-02-24 NOTE — Progress Notes (Signed)
ANTICOAGULATION CONSULT NOTE - Initial Consult  Pharmacy Consult for heparin Indication: new DVT, history of afib  Allergies  Allergen Reactions  . Keflex [Cephalexin] Shortness Of Breath and Other (See Comments)    dizziness    Patient Measurements: Height: 5\' 10"  (177.8 cm) Weight: 216 lb 11.4 oz (98.3 kg) IBW/kg (Calculated) : 73 Heparin Dosing Weight: 93.1kg  Vital Signs: Temp: 98.5 F (36.9 C) (03/08 1845) Temp Source: Oral (03/08 1845) BP: 122/79 mmHg (03/08 1845) Pulse Rate: 100 (03/08 1845)  Labs:  Recent Labs  02/22/16 0932 02/22/16 1338 02/22/16 1607 02/22/16 2300  02/23/16 0012 02/23/16 0330 02/23/16 0808 02/24/16 0450 02/24/16 0846 02/24/16 1825  HGB  --   --   --   --   < > 7.4*  --  7.9*  --  7.1*  --   HCT  --   --   --   --   --  22.4*  --  23.9*  --  21.2*  --   PLT  --   --   --   --   --  203  --  196  --  231  --   LABPROT  --   --  19.2*  --   --   --  19.7*  --  19.7*  --   --   INR  --   --  1.61*  --   --   --  1.67*  --  1.67*  --   --   HEPARINUNFRC  --   --   --   --   --   --   --   --   --   --  <0.10*  CREATININE 1.30*  --   --  1.52*  --   --  1.47*  --  1.51*  --   --   TROPONINI <0.03 <0.03  --  0.03  --   --   --   --   --   --   --   < > = values in this interval not displayed.  Estimated Creatinine Clearance: 55 mL/min (by C-G formula based on Cr of 1.51).   Medical History: Past Medical History  Diagnosis Date  . Coronary artery disease   . MI (myocardial infarction) ([place]) 2000    ANTERIOR, s/p PCI  . Ischemic cardiomyopathy     EF 23%  . Persistent atrial fibrillation (HCC)     on coumadin  . Chronic renal insufficiency   . Dyslipidemia   . Depression   . ED (erectile dysfunction)   . Other primary cardiomyopathies   . Presence of permanent cardiac pacemaker   . AICD (automatic cardioverter/defibrillator) present   . Dysrhythmia     atrial fibrillation  . CHF (congestive heart failure) ([place])   . Shortness of  breath dyspnea     on exertion    Assessment: 50 yom with hx afib on warf, now w/ new bilateral LE DVTs on dopplers. Warfarin held on admit for GIB + surgery for perforated duodenal ulcer. S/p OR for laparotomy. INR 8.68 on admit - reversed, now 1.67 this AM.  To start heparin for new DVT/afib hx (no bolus). Hgb down to 7.1, plt ok, no bleed.  Initial HL is undetectable on heparin 1550 units/hr. Nurse reports no issues with infusion or bleeding.   Goal of Therapy:  Heparin level 0.3-0.7 units/ml Monitor platelets by anticoagulation protocol: Yes   Plan:  Bolus heparin 2000 units and increase  rate to 1850 units/hr 6h HL, Daily HL/CBC Monitor s/sx bleeding Warfarin on hold for now  Andrey Cota. Diona Foley, PharmD, [place] Clinical Pharmacist Pager [phone]  02/24/2016 7:06 PM

## 2016-02-25 DIAGNOSIS — I5022 Chronic systolic (congestive) heart failure: Secondary | ICD-10-CM

## 2016-02-25 DIAGNOSIS — I952 Hypotension due to drugs: Secondary | ICD-10-CM

## 2016-02-25 DIAGNOSIS — K922 Gastrointestinal hemorrhage, unspecified: Secondary | ICD-10-CM

## 2016-02-25 LAB — GLUCOSE, CAPILLARY
GLUCOSE-CAPILLARY: 124 mg/dL — AB (ref 65–99)
GLUCOSE-CAPILLARY: 155 mg/dL — AB (ref 65–99)
Glucose-Capillary: 124 mg/dL — ABNORMAL HIGH (ref 65–99)
Glucose-Capillary: 134 mg/dL — ABNORMAL HIGH (ref 65–99)
Glucose-Capillary: 94 mg/dL (ref 65–99)

## 2016-02-25 LAB — TYPE AND SCREEN
ABO/RH(D): B POS
ANTIBODY SCREEN: NEGATIVE
UNIT DIVISION: 0
UNIT DIVISION: 0

## 2016-02-25 LAB — DIFFERENTIAL
BASOS ABS: 0 10*3/uL (ref 0.0–0.1)
Basophils Relative: 0 %
Eosinophils Absolute: 0.4 10*3/uL (ref 0.0–0.7)
Eosinophils Relative: 3 %
LYMPHS PCT: 8 %
Lymphs Abs: 1.1 10*3/uL (ref 0.7–4.0)
Monocytes Absolute: 0.9 10*3/uL (ref 0.1–1.0)
Monocytes Relative: 6 %
NEUTROS ABS: 10.9 10*3/uL — AB (ref 1.7–7.7)
NEUTROS PCT: 83 %

## 2016-02-25 LAB — HEPARIN LEVEL (UNFRACTIONATED)
HEPARIN UNFRACTIONATED: 0.54 [IU]/mL (ref 0.30–0.70)
Heparin Unfractionated: 0.34 IU/mL (ref 0.30–0.70)

## 2016-02-25 LAB — COMPREHENSIVE METABOLIC PANEL
ALBUMIN: 2.1 g/dL — AB (ref 3.5–5.0)
ALK PHOS: 35 U/L — AB (ref 38–126)
ALT: 16 U/L — ABNORMAL LOW (ref 17–63)
ANION GAP: 12 (ref 5–15)
AST: 18 U/L (ref 15–41)
BUN: 17 mg/dL (ref 6–20)
CO2: 24 mmol/L (ref 22–32)
Calcium: 8.5 mg/dL — ABNORMAL LOW (ref 8.9–10.3)
Chloride: 107 mmol/L (ref 101–111)
Creatinine, Ser: 1.4 mg/dL — ABNORMAL HIGH (ref 0.61–1.24)
GFR calc Af Amer: 58 mL/min — ABNORMAL LOW (ref >60)
GFR calc non Af Amer: 50 mL/min — ABNORMAL LOW (ref >60)
GLUCOSE: 141 mg/dL — AB (ref 65–99)
POTASSIUM: 3.4 mmol/L — AB (ref 3.5–5.1)
SODIUM: 143 mmol/L (ref 135–145)
Total Bilirubin: 0.6 mg/dL (ref 0.3–1.2)
Total Protein: 5.6 g/dL — ABNORMAL LOW (ref 6.5–8.1)

## 2016-02-25 LAB — CBC
HEMATOCRIT: 27.2 % — AB (ref 39.0–52.0)
Hemoglobin: 9.3 g/dL — ABNORMAL LOW (ref 13.0–17.0)
MCH: 27.8 pg (ref 26.0–34.0)
MCHC: 34.2 g/dL (ref 30.0–36.0)
MCV: 81.4 fL (ref 78.0–100.0)
PLATELETS: 243 10*3/uL (ref 150–400)
RBC: 3.34 MIL/uL — AB (ref 4.22–5.81)
RDW: 15.1 % (ref 11.5–15.5)
WBC: 13.2 10*3/uL — AB (ref 4.0–10.5)

## 2016-02-25 LAB — PROTIME-INR
INR: 1.54 — AB (ref 0.00–1.49)
PROTHROMBIN TIME: 18.5 s — AB (ref 11.6–15.2)

## 2016-02-25 LAB — PREALBUMIN: Prealbumin: 8.3 mg/dL — ABNORMAL LOW (ref 18–38)

## 2016-02-25 LAB — MAGNESIUM: Magnesium: 2.1 mg/dL (ref 1.7–2.4)

## 2016-02-25 LAB — PHOSPHORUS: PHOSPHORUS: 2.6 mg/dL (ref 2.5–4.6)

## 2016-02-25 MED ORDER — CIPROFLOXACIN IN D5W 400 MG/200ML IV SOLN
400.0000 mg | Freq: Two times a day (BID) | INTRAVENOUS | Status: DC
  Filled 2016-02-25 (×2): qty 200

## 2016-02-25 MED ORDER — FUROSEMIDE 10 MG/ML IJ SOLN
20.0000 mg | Freq: Every day | INTRAMUSCULAR | Status: DC
  Administered 2016-02-26 – 2016-03-01 (×5): 20 mg via INTRAVENOUS
  Filled 2016-02-25 (×6): qty 2

## 2016-02-25 MED ORDER — PIPERACILLIN-TAZOBACTAM 3.375 G IVPB
3.3750 g | Freq: Three times a day (TID) | INTRAVENOUS | Status: DC
  Administered 2016-02-25 – 2016-03-01 (×15): 3.375 g via INTRAVENOUS
  Filled 2016-02-25 (×19): qty 50

## 2016-02-25 MED ORDER — MAGNESIUM SULFATE 4 GM/100ML IV SOLN
INTRAVENOUS | Status: AC
  Administered 2016-02-25: 4 g via INTRAVENOUS
  Filled 2016-02-25: qty 100

## 2016-02-25 MED ORDER — POTASSIUM PHOSPHATES 15 MMOLE/5ML IV SOLN
10.0000 mmol | Freq: Once | INTRAVENOUS | Status: AC
  Administered 2016-02-25: 10 mmol via INTRAVENOUS
  Filled 2016-02-25: qty 3.33

## 2016-02-25 MED ORDER — POTASSIUM CHLORIDE 10 MEQ/50ML IV SOLN
10.0000 meq | INTRAVENOUS | Status: AC
  Administered 2016-02-25 (×2): 10 meq via INTRAVENOUS
  Filled 2016-02-25 (×2): qty 50

## 2016-02-25 MED ORDER — POTASSIUM CHLORIDE 10 MEQ/50ML IV SOLN
10.0000 meq | INTRAVENOUS | Status: AC
  Administered 2016-02-25 (×2): 10 meq via INTRAVENOUS
  Filled 2016-02-25: qty 50

## 2016-02-25 MED ORDER — MAGNESIUM SULFATE 4 GM/100ML IV SOLN
4.0000 g | Freq: Once | INTRAVENOUS | Status: AC
  Administered 2016-02-25: 4 g via INTRAVENOUS
  Filled 2016-02-25: qty 100

## 2016-02-25 MED ORDER — FAT EMULSION 20 % IV EMUL
240.0000 mL | INTRAVENOUS | Status: AC
  Administered 2016-02-25: 240 mL via INTRAVENOUS
  Filled 2016-02-25: qty 250

## 2016-02-25 MED ORDER — M.V.I. ADULT IV INJ
INJECTION | INTRAVENOUS | Status: AC
  Administered 2016-02-25: 17:00:00 via INTRAVENOUS
  Filled 2016-02-25: qty 1920

## 2016-02-25 MED ORDER — POTASSIUM CHLORIDE 10 MEQ/50ML IV SOLN: 10.0000 meq | INTRAVENOUS | Status: DC

## 2016-02-25 MED ORDER — METRONIDAZOLE IN NACL 5-0.79 MG/ML-% IV SOLN
500.0000 mg | Freq: Three times a day (TID) | INTRAVENOUS | Status: DC
  Administered 2016-02-25 – 2016-03-02 (×18): 500 mg via INTRAVENOUS
  Filled 2016-02-25 (×18): qty 100

## 2016-02-25 NOTE — Care Management Note (Signed)
Case Management Note  Patient Details  Name: [name] MRN: [id number] Date of Birth: July 15, 1946  Subjective/Objective:         S/p abd surgery           Action/Plan:  Pt is from home with wife, wife will provide recommended assistance/supervision post discharge.  CM will continue to monitor for disposition needs   Expected Discharge Date:  02/21/16               Expected Discharge Plan:  Home/Self Care  In-House Referral:     Discharge planning Services  CM Consult  Post Acute Care Choice:    Choice offered to:     DME Arranged:    DME Agency:     HH Arranged:    HH Agency:     Status of Service:  In process, will continue to follow  Medicare Important Message Given:    Date Medicare IM Given:    Medicare IM give by:    Date Additional Medicare IM Given:    Additional Medicare Important Message give by:     If discussed at [place] of Stay Meetings, dates discussed:    Additional Comments:  Maryclare Labrador, RN 02/25/2016, 10:58 AM

## 2016-02-25 NOTE — Progress Notes (Signed)
Patient's wife refuses for him to receive cipro. Notified Dr. Halford Chessman. Per MD will write new antibiotic orders. Will continue to monitor closely.

## 2016-02-25 NOTE — Progress Notes (Signed)
Patient ID: [name], male   DOB: 1946-10-03, 70 y.o.   MRN: [id number]     [facility]      [address] [place], [place] [address]    Phone: [phone] FAX: [phone]     Subjective: No flatus. No n/v.  824m NGT output. Drain with 298m serosang, does not look bilious. Little mobility. Afebrile.  VSS.  H&H stable after 1 unit pRBCs 3/8  Objective:  Vital signs:  Filed Vitals:   02/25/16 0550 02/25/16 0600 02/25/16 0630 02/25/16 0700  BP:  97/72  116/77  Pulse: 103 97 97 102  Temp:      TempSrc:      Resp: '28 17 19 22  '$ Height:      Weight:      SpO2: 97% 94% 97% 97%    Last BM Date: 02/21/16  Intake/Output   Yesterday:  03/08 0701 - 03/09 0700 In: 2816.7 [I.V.:1079.3; Blood:670; IV Piggyback:400; TPN:667.3] Out: 449774Urine:3650; Emesis/NG output:800; Drains:20] This shift:    I/O last 3 completed shifts: In: 3336.7 [I.V.:1399.3; Blood:670; IV PiFSELTRVUY:233]ut: 544356Urine:4370; Emesis/NG output:1050; Drains:50]    Physical Exam: General: Pt awake/alert/oriented x4 in no acute distress  Abdomen: +BS. Soft.  Nondistended.   Mildly tender at incisions only. Vac to midline wound.  No evidence of peritonitis.  No incarcerated hernias.  jp drain serosang output.     Problem List:   Principal Problem:   Symptomatic anemia Active Problems:   CAD S/P LAD PCI- 208616 Chronic systolic CHF (congestive heart failure) (HCC)   Biventricular implantable cardioverter-defibrillator Medtronic   BPH- s/p TURP 01/25/16 - followed by 4 weeks of Cipro   GI bleed   Hypotension   Chronic a-fib (HCC)   S/P TURP   CKD (chronic kidney disease) stage 3, GFR 30-59 ml/min   Supratherapeutic INR   Cardiomyopathy, ischemic-EF 20-25%   Chronic anticoagulation-Couamdin   Epigastric pain   C. difficile colitis   Acute respiratory failure with hypoxia (HCC)   Perforated ulcer (HCC)   Duodenal ulcer with perforation  (HCC)   Abdominal pain    Results:   Labs: Results for orders placed or performed during the hospital encounter of 02/18/16 (from the past 48 hour(s))  CBC     Status: Abnormal   Collection Time: 02/23/16  8:08 AM  Result Value Ref Range   WBC 10.3 4.0 - 10.5 K/uL   RBC 2.95 (L) 4.22 - 5.81 MIL/uL   Hemoglobin 7.9 (L) 13.0 - 17.0 g/dL   HCT 23.9 (L) 39.0 - 52.0 %   MCV 81.0 78.0 - 100.0 fL   MCH 26.8 26.0 - 34.0 pg   MCHC 33.1 30.0 - 36.0 g/dL   RDW 15.7 (H) 11.5 - 15.5 %   Platelets 196 150 - 400 K/uL  I-STAT 3, arterial blood gas (G3+)     Status: Abnormal   Collection Time: 02/23/16 11:24 AM  Result Value Ref Range   pH, Arterial 7.440 7.350 - 7.450   pCO2 arterial 31.8 (L) 35.0 - 45.0 mmHg   pO2, Arterial 193.0 (H) 80.0 - 100.0 mmHg   Bicarbonate 21.6 20.0 - 24.0 mEq/L   TCO2 23 0 - 100 mmol/L   O2 Saturation 100.0 %   Acid-base deficit 2.0 0.0 - 2.0 mmol/L   Patient temperature HIDE    Collection site ARTERIAL LINE    Sample type ARTERIAL   H. pylori antibody, IgG  Status: None   Collection Time: 02/23/16  1:59 PM  Result Value Ref Range   H Pylori IgG <0.9 0.0 - 0.8 U/mL    Comment: (NOTE)                             Negative            <0.9                             Indeterminate  0.9 - 1.0                             Positive            >1.0 Performed At: [facility] [address] [place], [place] [id number] Mila Homer MD VP:[id number]   Protime-INR     Status: Abnormal   Collection Time: 02/24/16  4:50 AM  Result Value Ref Range   Prothrombin Time 19.7 (H) 11.6 - 15.2 seconds   INR 1.67 (H) 0.00 - 1.49  Comprehensive metabolic panel     Status: Abnormal   Collection Time: 02/24/16  4:50 AM  Result Value Ref Range   Sodium 143 135 - 145 mmol/L   Potassium 4.0 3.5 - 5.1 mmol/L   Chloride 112 (H) 101 - 111 mmol/L   CO2 21 (L) 22 - 32 mmol/L   Glucose, Bld 99 65 - 99 mg/dL   BUN 17 6 - 20 mg/dL   Creatinine, Ser 2.96 (H) 0.61 -  1.24 mg/dL   Calcium 8.4 (L) 8.9 - 10.3 mg/dL   Total Protein 5.1 (L) 6.5 - 8.1 g/dL   Albumin 2.0 (L) 3.5 - 5.0 g/dL   AST 28 15 - 41 U/L   ALT 19 17 - 63 U/L   Alkaline Phosphatase 37 (L) 38 - 126 U/L   Total Bilirubin 0.8 0.3 - 1.2 mg/dL   GFR calc non Af Amer 46 (L) >60 mL/min   GFR calc Af Amer 53 (L) >60 mL/min    Comment: (NOTE) The eGFR has been calculated using the CKD EPI equation. This calculation has not been validated in all clinical situations. eGFR's persistently <60 mL/min signify possible Chronic Kidney Disease.    Anion gap 10 5 - 15  Triglycerides     Status: None   Collection Time: 02/24/16  4:50 AM  Result Value Ref Range   Triglycerides 78 <150 mg/dL  CBC     Status: Abnormal   Collection Time: 02/24/16  8:46 AM  Result Value Ref Range   WBC 12.5 (H) 4.0 - 10.5 K/uL   RBC 2.61 (L) 4.22 - 5.81 MIL/uL   Hemoglobin 7.1 (L) 13.0 - 17.0 g/dL   HCT 03.9 (L) 05.6 - 46.9 %   MCV 81.2 78.0 - 100.0 fL   MCH 27.2 26.0 - 34.0 pg   MCHC 33.5 30.0 - 36.0 g/dL   RDW 80.6 (H) 07.8 - 95.0 %   Platelets 231 150 - 400 K/uL  Prepare RBC     Status: None   Collection Time: 02/24/16 12:08 PM  Result Value Ref Range   Order Confirmation ORDER PROCESSED BY BLOOD BANK   Heparin level (unfractionated)     Status: Abnormal   Collection Time: 02/24/16  6:25 PM  Result Value Ref Range   Heparin Unfractionated <0.10 (L) 0.30 -  0.70 IU/mL    Comment:        IF HEPARIN RESULTS ARE BELOW EXPECTED VALUES, AND PATIENT DOSAGE HAS BEEN CONFIRMED, SUGGEST FOLLOW UP TESTING OF ANTITHROMBIN III LEVELS.   Hemoglobin and hematocrit, blood     Status: Abnormal   Collection Time: 02/24/16  8:00 PM  Result Value Ref Range   Hemoglobin 9.3 (L) 13.0 - 17.0 g/dL    Comment: POST TRANSFUSION SPECIMEN   HCT 27.5 (L) 39.0 - 52.0 %  Glucose, capillary     Status: Abnormal   Collection Time: 02/24/16 11:53 PM  Result Value Ref Range   Glucose-Capillary 134 (H) 65 - 99 mg/dL   Comment 1  Capillary Specimen    Comment 2 Notify RN    Comment 3 Document in Chart   Comprehensive metabolic panel     Status: Abnormal   Collection Time: 02/25/16  2:06 AM  Result Value Ref Range   Sodium 143 135 - 145 mmol/L   Potassium 3.4 (L) 3.5 - 5.1 mmol/L   Chloride 107 101 - 111 mmol/L   CO2 24 22 - 32 mmol/L   Glucose, Bld 141 (H) 65 - 99 mg/dL   BUN 17 6 - 20 mg/dL   Creatinine, Ser 1.40 (H) 0.61 - 1.24 mg/dL   Calcium 8.5 (L) 8.9 - 10.3 mg/dL   Total Protein 5.6 (L) 6.5 - 8.1 g/dL   Albumin 2.1 (L) 3.5 - 5.0 g/dL   AST 18 15 - 41 U/L   ALT 16 (L) 17 - 63 U/L   Alkaline Phosphatase 35 (L) 38 - 126 U/L   Total Bilirubin 0.6 0.3 - 1.2 mg/dL   GFR calc non Af Amer 50 (L) >60 mL/min   GFR calc Af Amer 58 (L) >60 mL/min    Comment: (NOTE) The eGFR has been calculated using the CKD EPI equation. This calculation has not been validated in all clinical situations. eGFR's persistently <60 mL/min signify possible Chronic Kidney Disease.    Anion gap 12 5 - 15  Prealbumin     Status: Abnormal   Collection Time: 02/25/16  2:06 AM  Result Value Ref Range   Prealbumin 8.3 (L) 18 - 38 mg/dL  Magnesium     Status: None   Collection Time: 02/25/16  2:06 AM  Result Value Ref Range   Magnesium 2.1 1.7 - 2.4 mg/dL  Phosphorus     Status: None   Collection Time: 02/25/16  2:06 AM  Result Value Ref Range   Phosphorus 2.6 2.5 - 4.6 mg/dL  Differential     Status: Abnormal   Collection Time: 02/25/16  2:06 AM  Result Value Ref Range   Neutrophils Relative % 83 %   Neutro Abs 10.9 (H) 1.7 - 7.7 K/uL   Lymphocytes Relative 8 %   Lymphs Abs 1.1 0.7 - 4.0 K/uL   Monocytes Relative 6 %   Monocytes Absolute 0.9 0.1 - 1.0 K/uL   Eosinophils Relative 3 %   Eosinophils Absolute 0.4 0.0 - 0.7 K/uL   Basophils Relative 0 %   Basophils Absolute 0.0 0.0 - 0.1 K/uL  CBC     Status: Abnormal   Collection Time: 02/25/16  2:06 AM  Result Value Ref Range   WBC 13.2 (H) 4.0 - 10.5 K/uL   RBC 3.34 (L)  4.22 - 5.81 MIL/uL   Hemoglobin 9.3 (L) 13.0 - 17.0 g/dL   HCT 27.2 (L) 39.0 - 52.0 %   MCV 81.4 78.0 - 100.0  fL   MCH 27.8 26.0 - 34.0 pg   MCHC 34.2 30.0 - 36.0 g/dL   RDW 15.1 11.5 - 15.5 %   Platelets 243 150 - 400 K/uL  Heparin level (unfractionated)     Status: None   Collection Time: 02/25/16  2:09 AM  Result Value Ref Range   Heparin Unfractionated 0.34 0.30 - 0.70 IU/mL    Comment:        IF HEPARIN RESULTS ARE BELOW EXPECTED VALUES, AND PATIENT DOSAGE HAS BEEN CONFIRMED, SUGGEST FOLLOW UP TESTING OF ANTITHROMBIN III LEVELS.   Glucose, capillary     Status: Abnormal   Collection Time: 02/25/16  6:23 AM  Result Value Ref Range   Glucose-Capillary 124 (H) 65 - 99 mg/dL   Comment 1 Capillary Specimen    Comment 2 Notify RN    Comment 3 Document in Chart     Imaging / Studies: Dg Abd 1 View  02/24/2016  CLINICAL DATA:  New NG tube position. EXAM: ABDOMEN - 1 VIEW COMPARISON:  02/18/2016 FINDINGS: Enteric tube tip is in the left upper quadrant consistent with location in the upper stomach. Proximal side hole is projected just below the expected location of the EG junction. Left pleural effusion. IMPRESSION: Enteric tube tip is position in the left upper quadrant consistent with location in the upper stomach. Electronically Signed   By: Lucienne Capers M.D.   On: 02/24/2016 06:01   Dg Chest Port 1 View  02/24/2016  CLINICAL DATA:  Confirmed tip of the left-sided PICC line. EXAM: PORTABLE CHEST 1 VIEW COMPARISON:  Chest x-ray of earlier today. FINDINGS: The PICC line tip appears to have been withdrawn partially sensitive tip overlies the distal third of the SVC. The right internal jugular venous catheter tip projects over the midportion of the SVC. The other support tubes and lines are in reasonable position. IMPRESSION: The PICC line tip has been withdrawn approximately 2 cm and appears to overlie the distal third of the SVC. Electronically Signed   By: David  Martinique M.D.   On:  02/24/2016 15:44   Dg Chest Port 1 View  02/24/2016  CLINICAL DATA:  Status post PICC line placement EXAM: PORTABLE CHEST 1 VIEW COMPARISON:  02/24/2016 FINDINGS: Nasogastric catheter, defibrillator and right jugular central line are again seen and stable. A new left-sided PICC line is noted with the catheter tip in the mid right atrium. This could be withdrawn 2-3 cm. Cardiac shadow is stable. The lungs are hypoinflated with minimal left basilar atelectasis. IMPRESSION: Status post PICC line as described.  This could be withdrawn 2-3 cm. Electronically Signed   By: Inez Catalina M.D.   On: 02/24/2016 13:29   Dg Chest Port 1 View  02/24/2016  CLINICAL DATA:  Acute respiratory failure with hypoxia, CHF, coronary artery disease, former smoker. EXAM: PORTABLE CHEST 1 VIEW COMPARISON:  Portable chest x-ray of February 22, 2016 FINDINGS: The endotracheal tube is not clearly evident and may have been removed. The lungs are mildly hypoinflated. Persistent increased density at both lung bases is demonstrated. There is further obscuration of the left hemidiaphragm. The cardiac silhouette is enlarged. The central pulmonary vascularity is prominent. The right internal jugular venous catheter tip projects over the midportion of the SVC. The esophagogastric tubes proximal port lies at or just below the GE junction. The pacemaker-defibrillator electrodes are in stable position. IMPRESSION: Probable extubation of the trachea. Persistent mild hypoinflation with worsening bibasilar atelectasis or pneumonia on the left. A small amount of  pleural fluid is likely present at both lung bases. Advancement of the nasogastric tube by 10 cm is recommended to assure that the proximal port remains below the GE junction. Electronically Signed   By: David  Martinique M.D.   On: 02/24/2016 07:31    Medications / Allergies:  Scheduled Meds: . antiseptic oral rinse  7 mL Mouth Rinse QID  . chlorhexidine gluconate  15 mL Mouth Rinse BID  . insulin  aspart  0-9 Units Subcutaneous 4 times per day  . metoprolol  2.5 mg Intravenous 4 times per day  . pantoprazole (PROTONIX) IV  80 mg Intravenous Q12H  . piperacillin-tazobactam (ZOSYN)  IV  3.375 g Intravenous 3 times per day  . potassium chloride  10 mEq Intravenous Q1 Hr x 2  . potassium phosphate IVPB (mmol)  10 mmol Intravenous Once  . sodium chloride flush  10-40 mL Intracatheter Q12H  . vancomycin  125 mg Oral QID   Continuous Infusions: . sodium chloride 10 mL (02/24/16 2220)  . Marland KitchenTPN (CLINIMIX-E) Adult 40 mL/hr at 02/24/16 1739   And  . fat emulsion 480 kcal (02/25/16 0600)  . Marland KitchenTPN (CLINIMIX-E) Adult     And  . fat emulsion    . heparin 1,850 Units/hr (02/25/16 0600)   PRN Meds:.fentaNYL (SUBLIMAZE) injection, ipratropium-albuterol, [DISCONTINUED] ondansetron **OR** ondansetron (ZOFRAN) IV, promethazine, sodium chloride flush  Antibiotics: Anti-infectives    Start     Dose/Rate Route Frequency Ordered Stop   02/23/16 0000  piperacillin-tazobactam (ZOSYN) IVPB 3.375 g    Comments:  Zosyn 3.375 g IV q8h for CrCl > 20 mL/min   3.375 g 12.5 mL/hr over 240 Minutes Intravenous 3 times per day 02/22/16 2351     02/22/16 2245  metroNIDAZOLE (FLAGYL) IVPB 500 mg  Status:  Discontinued     500 mg 100 mL/hr over 60 Minutes Intravenous 3 times per day 02/22/16 2230 02/24/16 1030   02/22/16 2230  metroNIDAZOLE (FLAGYL) IVPB 500 mg  Status:  Discontinued     500 mg 100 mL/hr over 60 Minutes Intravenous Every 8 hours 02/22/16 2229 02/22/16 2230   02/22/16 1630  clindamycin (CLEOCIN) IVPB 600 mg    Comments:  Pharmacy may adjust dosing strength, interval, or rate of medication as needed for optimal therapy for the patient Send with patient on call to the OR.  Anesthesia to complete antibiotic administration <63mn [facility]   600 mg 100 mL/hr over 30 Minutes Intravenous On call to O.R. 02/22/16 1617 02/22/16 1915   02/19/16 0430  vancomycin (VANCOCIN) 50  mg/mL oral solution 125 mg     125 mg Oral 4 times daily 02/19/16 0417 03/03/16 2159        Assessment/Plan UGI bleeding & Perforated DU POD #3 s/p Ex Lap, graham patch, and drain placement - Dr. WDonne Hazel-NPO, NG tube, IVF, antiemetics, pain contro-Pepcid infusion plus Protonix. -plan for UGI to evaluate for a leak Saturday  -h pylori negative  -Graham patch closure is a little tenuous, large ulcer. -Mobilize, IS, up to chair, PT  -VAC changes MWF ID-continue zosyn D#2/7 for perf Acute DVT-heparin gtt VDRF - resolved  AFIB, CHF, CAD, MI, ICM, HLD - per cards ABL anemia -stable C.diff - vanc, doubt PO will be absorbed at this point, consider alternative route  PCM-TPN Disp - stable for transfer from a surgical standpoint     [facility]rby Pian [facility] Pager [phone](7A-4:30P) For consults and floor pages call [phone](7A-4:30P)  02/25/2016 8:06  AM    

## 2016-02-25 NOTE — Progress Notes (Signed)
PULMONARY / CRITICAL CARE MEDICINE   Name: MEKHAI GAYMON MRN: [id number] DOB: 1946-10-19    ADMISSION DATE:  02/18/2016 CONSULTATION DATE:  02/22/2016  REFERRING MD:  [facility]  CHIEF COMPLAINT:  Dizziness  SUBJECTIVE:  Feels thirsty.  Denies chest pain.  VITAL SIGNS: BP 116/77 mmHg  Pulse 102  Temp(Src) 99.1 F (37.3 C) (Oral)  Resp 22  Ht 5\' 10"  (1.778 m)  Wt 216 lb 11.4 oz (98.3 kg)  BMI 31.09 kg/m2  SpO2 97%  INTAKE / OUTPUT: I/O last 3 completed shifts: In: 3336.7 [I.V.:1399.3; Blood:670; IV Piggyback:600] Out: [id number] [Urine:4370; Emesis/NG output:1050; Drains:50]  PHYSICAL EXAMINATION: General:  alert Neuro:  Normal strength, follows commands HEENT: NG in, no stridor Cardiovascular: regular, no murmur Lungs: no wheeze Abdomen: wound dressing clean Musculoskeletal: swelling in Lt arm Skin: no rashes  LABS:  BMET  Recent Labs Lab 02/23/16 0330 02/24/16 0450 02/25/16 0206  NA 141 143 143  K 4.1 4.0 3.4*  CL 110 112* 107  CO2 21* 21* 24  BUN 18 17 17   CREATININE 1.47* 1.51* 1.40*  GLUCOSE 114* 99 141*    Electrolytes  Recent Labs Lab 02/23/16 0330 02/24/16 0450 02/25/16 0206  CALCIUM 8.5* 8.4* 8.5*  MG 1.8  --  2.1  PHOS 3.8  --  2.6    CBC  Recent Labs Lab 02/23/16 0808 02/24/16 0846 02/24/16 2000 02/25/16 0206  WBC 10.3 12.5*  --  13.2*  HGB 7.9* 7.1* 9.3* 9.3*  HCT 23.9* 21.2* 27.5* 27.2*  PLT 196 231  --  243    Coag's  Recent Labs Lab 02/22/16 1607 02/23/16 0330 02/24/16 0450  INR 1.61* 1.67* 1.67*    ABG  Recent Labs Lab 02/22/16 2129 02/23/16 1124  PHART 7.385 7.440  PCO2ART 36.7 31.8*  PO2ART 122.0* 193.0*    Liver Enzymes  Recent Labs Lab 02/23/16 0330 02/24/16 0450 02/25/16 0206  AST 45* 28 18  ALT 24 19 16*  ALKPHOS 27* 37* 35*  BILITOT 0.5 0.8 0.6  ALBUMIN 2.3* 2.0* 2.1*    Cardiac Enzymes  Recent Labs Lab 02/22/16 0932 02/22/16 1338 02/22/16 2300  TROPONINI <0.03 <0.03 0.03     Glucose  Recent Labs Lab 02/24/16 2353 02/25/16 0623  GLUCAP 134* 124*    Imaging Dg Chest Port 1 View  02/24/2016  CLINICAL DATA:  Confirmed tip of the left-sided PICC line. EXAM: PORTABLE CHEST 1 VIEW COMPARISON:  Chest x-ray of earlier today. FINDINGS: The PICC line tip appears to have been withdrawn partially sensitive tip overlies the distal third of the SVC. The right internal jugular venous catheter tip projects over the midportion of the SVC. The other support tubes and lines are in reasonable position. IMPRESSION: The PICC line tip has been withdrawn approximately 2 cm and appears to overlie the distal third of the SVC. Electronically Signed   By: David  Martinique M.D.   On: 02/24/2016 15:44   Dg Chest Port 1 View  02/24/2016  CLINICAL DATA:  Status post PICC line placement EXAM: PORTABLE CHEST 1 VIEW COMPARISON:  02/24/2016 FINDINGS: Nasogastric catheter, defibrillator and right jugular central line are again seen and stable. A new left-sided PICC line is noted with the catheter tip in the mid right atrium. This could be withdrawn 2-3 cm. Cardiac shadow is stable. The lungs are hypoinflated with minimal left basilar atelectasis. IMPRESSION: Status post PICC line as described.  This could be withdrawn 2-3 cm. Electronically Signed   By: Linus Mako.D.  On: 02/24/2016 13:29     STUDIES:  02-Mar-2023 CT chest >> no PE Mar 02, 2023 CT abd/pelvis >> pneumoperitoneum 3/07 Doppler legs b/l >> DVT Rt soleal vein, DVT Lt peroneal vein  CULTURES: 3/02 C diff PCR >> positive  ANTIBIOTICS: 3/02 Enteral vancomycin >> 3/09 2023-03-02 Zosyn >> 3/09 02-Mar-2023 Flagyl >>  3/09 Cipro >>  SIGNIFICANT EVENTS: 3/02 Admit 03-02-2023 Pneumoperitoneum on CT abd >> Surgery consulted; cardiology consulted 3/08 start heparin gtt; transfuse PRBC  LINES/TUBES: 2023/03/02 ETT >> 3/07  03-02-23 Rt IJ CVL >> 03-02-2023 Lt radial aline >> 3/08 3/08 Lt PICC >>   DISCUSSION: 70 yo male presented with dizziness, hypotension, abdominal pain,  anemia from GI bleeding with supratherapeutic INR.  Found to have pneumoperitoneum from perforated duodenal ulcer, and had emergent laparotomy.    ASSESSMENT / PLAN:  PULMONARY A: Acute respiratory failure. P:   Oxygen to keep SpO2 > 92% Bronchial hygiene Change BD's to prn  CARDIOVASCULAR A:  Hypotension 2nd to acute blood loss from GI bleed >> resolved. Chronic systolic CHF s/p AICD >> EF 20 to 25% from Echo 01/06/16. Hx of A fib, CAD, HLD. B/l lower extremity DVT noted 3/07. P:  Continue IV lopressor Continue heparin gtt >> will eventually need to resume coumadin  RENAL A:   CKD stage 3. S/p TURP 01/25/16. Hypokalemia. P:   Monitor renal fx, urine outpt  GASTROINTESTINAL A:   GI bleed from perforated duodenal ulcer s/p Phillip Heal patch 2023-03-02. P:   High protonix gtt per surgery TNA/nutrition, wound care per surgery  HEMATOLOGIC A:   Anemia from GI bleed and critical illness. P:  F/u CBC Transfuse for Hb < 7 SCD's  INFECTIOUS A:   C diff colitis >> not certain if he is absorbing enteral vancomycin. Peritonitis 2nd to perforated duodenal ulcer P:   Resume flagyl and add cipro 3/09 D/c zosyn, enteral vancomycin 3/09  ENDOCRINE A:   Hyperglycemia. P:   SSI while on TNA  NEUROLOGIC A:   Post-op pain control. Hx of depression. Deconditioning. P:   Prn fentanyl PT/OT >> okay to start 3/09 mobilizing per surgery  Updated pt's wife at bedside.  Will transfer to SDU 3/09 >> will ask [facility] to resume care 3/10 and PCCM off.  Chesley Mires, MD [facility] Pulmonary/Critical Care 02/25/2016, 10:27 AM Pager:  [phone] After 3pm call: [phone]

## 2016-02-25 NOTE — Progress Notes (Signed)
PARENTERAL NUTRITION CONSULT NOTE - FOLLOW UP  Pharmacy Consult:  TPN Indication:  Prolonged ileus  Allergies  Allergen Reactions  . Keflex [Cephalexin] Shortness Of Breath and Other (See Comments)    dizziness    Patient Measurements: Height: '5\' 10"'  (177.8 cm) Weight: 216 lb 11.4 oz (98.3 kg) IBW/kg (Calculated) : 73  Vital Signs: Temp: 99.1 F (37.3 C) (03/09 0415) Temp Source: Oral (03/09 0415) BP: 116/77 mmHg (03/09 0700) Pulse Rate: 102 (03/09 0700) Intake/Output from previous day: 03/08 0701 - 03/09 0700 In: 2816.7 [I.V.:1079.3; Blood:670; IV Piggyback:400; TPN:667.3] Out: 2482 [Urine:3520; Emesis/NG output:750; Drains:20]  Labs:  Recent Labs  02/22/16 1607  02/23/16 0330 02/23/16 0808 02/24/16 0450 02/24/16 0846 02/24/16 2000 02/25/16 0206  WBC  --   < >  --  10.3  --  12.5*  --  13.2*  HGB  --   < >  --  7.9*  --  7.1* 9.3* 9.3*  HCT  --   < >  --  23.9*  --  21.2* 27.5* 27.2*  PLT  --   < >  --  196  --  231  --  243  INR 1.61*  --  1.67*  --  1.67*  --   --   --   < > = values in this interval not displayed.   Recent Labs  02/23/16 0330 02/24/16 0450 02/25/16 0206  NA 141 143 143  K 4.1 4.0 3.4*  CL 110 112* 107  CO2 21* 21* 24  GLUCOSE 114* 99 141*  BUN '18 17 17  ' CREATININE 1.47* 1.51* 1.40*  CALCIUM 8.5* 8.4* 8.5*  MG 1.8  --  2.1  PHOS 3.8  --  2.6  PROT 5.5* 5.1* 5.6*  ALBUMIN 2.3* 2.0* 2.1*  AST 45* 28 18  ALT 24 19 16*  ALKPHOS 27* 37* 35*  BILITOT 0.5 0.8 0.6  PREALBUMIN  --   --  8.3*  TRIG  --  78  --    Estimated Creatinine Clearance: 59.4 mL/min (by C-G formula based on Cr of 1.4).    Recent Labs  02/24/16 2353 02/25/16 0623  GLUCAP 134* 124*     Insulin Requirements in the past 24 hours:  1 unit SSI yesterday  Assessment: 57 YOM presented on 02/18/16 with hypotension and dizziness from GIB.  Found to have perforated duodenum and was taken to the OR on 02/22/16 for ex-lap with [name] patch of duodenal ulcer.   Pharmacy consulted to initiate TPN for nutritional support given expected prolonged bowel rest.  GI: admitted with GIB on high dose PPI IV BID, baseline prealbumin low at 8.3.  Emesis/NG O/P 772m, drains 228mEndo: TSH WNL.  No hx DM - CBGs controlled Lytes: K+ 3.4 (2 runs given, goal >/= 4 for ileus), others WNL (phos low normal) Renal: CKD3, s/p recent TURP (2/6) - SCr improved to 1.4, BUN  WNL - good UOP 1.5 ml/kg/hr, NS at 10 ml/hr Pulm: intubated for procedure, extubated 3/7 to RA, now on 2L [place] [facility] [place] Cards: CAD / ICM / AFib / DL / AICD / CHF - BP soft, tachy (in Afib) - Lopressor IV AC: Coumadin PTA for hx AFib >> held d/t GIB.  New DVT on 3/7 >> heparin gtt started 3/8 - anemic (anemia panel WNL), plts WNL, INR 1.67  Hepatobil: mild elevated alk phos, others WNL.  TG WNL. Neuro: depression - A&O, pain score 0-7, PRN Fentanyl available ID: PO Vanc for C.diff  colitis + Zosyn for intra-abd infxn, long course of Cipro PTA - afebrile, WBC increased to 13.2 Best Practices: heparin gtt TPN Access: right IJ placed 02/22/16, PICC placed 02/24/16 TPN start date: 02/24/16  Current Nutrition:  TPN  Nutritional Goals:  2000-2200 kCal, 120-130 grams of protein per day   Plan:  - Increase Clinimix E 5/15 to 80 ml/hr (goal 100 ml/hr) and continue ILE 20% at 10 ml/hr.  Will not be able to concentrate TPN given limitation of premixed Clinimix.  Watch volume, respiratory status. - Daily multivitamin and trace elements in TPN - D/C IV folate and add to TPN - Continue sensitive SSI Q6H.  D/C if CBGs remain controlled at goal TPN rate. - KCL x 2 runs (total of 4 runs today) - KPhos 10 mmol IV x 1 - F/U AM labs    Yariah Selvey D. Mina Marble, PharmD, BCPS Pager:  [phone] 02/25/2016, 8:04 AM

## 2016-02-25 NOTE — Progress Notes (Signed)
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 difficile colitis   Duodenal ulcer with perforation (HCC)   Chronic a-fib (HCC)   Chronic anticoagulation-Couamdin   CAD S/P LAD PCI- 2000   BPH- s/p TURP 01/25/16 - followed by 4 weeks of Cipro   GI bleed   Hypotension   S/P TURP   CKD (chronic kidney disease) stage 3, GFR 30-59 ml/min   Epigastric pain   Acute respiratory failure with hypoxia (HCC)   Perforated ulcer ([place])   Abdominal pain   Admitted with Abd pain & fatigue - noted to be Anemic with supratherapeutic INR. Noted to have C-diff colitis, but on CT scan yesterday noted to have Duodenal Ulcer -- s/p Ex Lap & ulcer closure.   Subjective   He is now extubated. He did well with 2 units transfusion red blood cells yesterday. Blood pressure is more stable and he is transferred to stepdown  Pain is relatively well-controlled. Still has NG tube in place for bowel rest and is being provided TPN for nutrition. No PND or orthopnea. No sensation of rapid irregular heartbeat despite his baseline heart rates in the low 100s to 90s.  Inpatient Medications    . antiseptic oral rinse  7 mL Mouth Rinse QID  . chlorhexidine gluconate  15 mL Mouth Rinse BID  . insulin aspart  0-9 Units Subcutaneous 4 times per day  . metoprolol  2.5 mg Intravenous 4 times per day  . metronidazole  500 mg Intravenous Q8H  . pantoprazole (PROTONIX) IV  80 mg Intravenous Q12H  . piperacillin-tazobactam (ZOSYN)  IV  3.375 g Intravenous 3 times per day  . sodium chloride flush  10-40 mL Intracatheter Q12H    Vital Signs    Filed Vitals:   02/25/16 1543 02/25/16 1610 02/25/16 1725 02/25/16 1922  BP: 101/64 109/60  120/73 123/95  Pulse: 111 111 103 106  Temp:  98 F (36.7 C)  98.3 F (36.8 C)  TempSrc:  Oral  Oral  Resp:  22  20  Height:      Weight:      SpO2: 97% 94% 93% 96%    Intake/Output Summary (Last 24 hours) at 02/25/16 2334 Last data filed at 02/25/16 1826  Gross per 24 hour  Intake 2322.52 ml  Output   1390 ml  Net 932.52 ml   Filed Weights   02/22/16 0500 02/23/16 0422 02/24/16 0400  Weight: 311 lb 9.6 oz (141.341 kg) 218 lb 4.1 oz (99 kg) 216 lb 11.4 oz (98.3 kg)    Physical Exam    General: Pleasant,  less ill appearing - better skin color overall, but in good spirits. Neuro: Alert and oriented X 3. Moves all extremities spontaneously. Psych: Normal affect. HEENT:  Normal; NG tube in place  Neck: Supple without bruits or JVD. Lungs:  Resp regular and unlabored, coarse BS t/o, but no obvious rales. Heart: Irreg-Irreg ~ borderline tachycardic.  normal S1 and S2 no s3, s4, or murmurs. Abdomen: Soft, non-tender, non-distended, BS + x 4.  Extremities: No clubbing, cyanosis or edema. DP/PT/Radials 2+ and equal bilaterally.  Labs    CBC  Recent Labs  02/24/16 0846 02/24/16 2000 02/25/16 0206  WBC 12.5*  --  13.2*  NEUTROABS  --   --  10.9*  HGB 7.1* 9.3* 9.3*  HCT 21.2* 27.5* 27.2*  MCV 81.2  --  81.4  PLT 231  --  0000000   Basic Metabolic Panel  Recent Labs  02/23/16 0330 02/24/16 0450 02/25/16 0206  NA 141 143 143  K 4.1 4.0 3.4*  CL 110 112* 107  CO2 21* 21* 24  GLUCOSE 114* 99 141*  BUN 18 17 17   CREATININE 1.47* 1.51* 1.40*  CALCIUM 8.5* 8.4* 8.5*  MG 1.8  --  2.1  PHOS 3.8  --  2.6   Liver Function Tests  Recent Labs  02/24/16 0450 02/25/16 0206  AST 28 18  ALT 19 16*  ALKPHOS 37* 35*  BILITOT 0.8 0.6  PROT 5.1* 5.6*  ALBUMIN 2.0* 2.1*   No results for input(s): LIPASE, AMYLASE in the last 72 hours. Cardiac Enzymes No results for input(s): CKTOTAL, CKMB, CKMBINDEX, TROPONINI in the last 72 hours. BNP Invalid input(s):  POCBNP D-Dimer No results for input(s): DDIMER in the last 72 hours. Hemoglobin A1C No results for input(s): HGBA1C in the last 72 hours. Fasting Lipid Panel  Recent Labs  02/24/16 0450  TRIG 78   Thyroid Function Tests No results for input(s): TSH, T4TOTAL, T3FREE, THYROIDAB in the last 72 hours.  Invalid input(s): FREET3  Telemetry    Afib with PVCs in couplets & triplets.  Intermittently paced rates mostly in 90s until agitated  ECG    n/a  Radiology    CXR: IMPRESSION: Probable extubation of the trachea. Persistent mild hypoinflation with worsening bibasilar atelectasis or pneumonia on the left. A small amount of pleural fluid is likely present at both lung bases. Advancement of the nasogastric tube by 10 cm is recommended to assure that the proximal port remains below the GE junction.   Assessment & Plan    Symptomatic anemia /GI bleed- Hgb 7.9  -down to 7.1 now up to 9.3 following transfusion  Transfused  2Units PRBC given Cardiomyopathy  Follow hemoglobin count    Cardiomyopathy, ischemic-EF [id number] Chronic systolic CHF (congestive heart failure) (HCC) - no active HF exacerbation Sx. CXR looks clear.   Avoid XS volume load. -- Will need standing diuretic dosing. ~20 mg IV daily while NPO.    Biventricular implantable cardioverter-defibrillator Medtronic   Supratherapeutic INR - reversed.    Chronic a-fib ([place]) / Chronic anticoagulation-Couamdin held for surgery & GIB.    Rate still in 90-110 range - likely driven by discomfort & anemia.  Started Metoprolol to IV 2.5 mg q6hr for now. -- will need to clarify his home regimen (has both Cored & Toprol listed - apparently the Toprol was added for additional rate control)   If blood pressure tolerates, we can increase to 5 mg every 6 or 2.5 every 4   IV Heparin started 2/2 Popliteal DVT - would continue until able to restart warfarin & complete bridge. (could use Lovenox if no active bleed on IV  Heparin)  CAD S/P LAD PCI- 2000 - no antecedent angina.  Would expect a low Troponin elevation with existing CAD & Cardiomyopathy, but would simply continue home Rx as best possible.   Restart oral medications when able take by mouth. For now continue IV beta blocker. No active symptoms.  C. difficile colitis - per PCCM    Duodenal ulcer with perforation ([place]) - s/p repair yesterday.  Plan is TPN to start ~tomorrow -- would ensure that we use concentrated feeds to aviod volume overload.  BPH- s/p TURP 01/25/16 - followed by 4 weeks of Cipro      Hypotension - stable; tolerating IV BB. -- Blood pressure is deathly improved after transfusion. Could consider titrating up beta blocker dose.    CKD (chronic kidney disease) stage 3, GFR 30-59 ml/min: stable      Acute respiratory failure with hypoxia (HCC) - Intubated for surgery with slow wean from Vent today.     Signed, Leonie Man, M.D., M.S. Interventional Cardiologist   Pager # [phone] Phone # [phone] [address] [place],  [address]

## 2016-02-25 NOTE — Progress Notes (Signed)
ANTICOAGULATION CONSULT NOTE - Follow-up Consult  Pharmacy Consult for heparin Indication: new DVT, history of afib  Allergies  Allergen Reactions  . Keflex [Cephalexin] Shortness Of Breath and Other (See Comments)    dizziness    Patient Measurements: Height: 5\' 10"  (177.8 cm) Weight: 216 lb 11.4 oz (98.3 kg) IBW/kg (Calculated) : 73 Heparin Dosing Weight: 93.1kg  Vital Signs: Temp: 99.1 F (37.3 C) (03/09 0415) Temp Source: Oral (03/09 0415) BP: 116/77 mmHg (03/09 0700) Pulse Rate: 102 (03/09 0700)  Labs:  Recent Labs  02/22/16 0932 02/22/16 1338 02/22/16 1607 02/22/16 2300  02/23/16 0330 02/23/16 0808 02/24/16 0450 02/24/16 0846 02/24/16 1825 02/24/16 2000 02/25/16 0206 02/25/16 0209  HGB  --   --   --   --   < >  --  7.9*  --  7.1*  --  9.3* 9.3*  --   HCT  --   --   --   --   < >  --  23.9*  --  21.2*  --  27.5* 27.2*  --   PLT  --   --   --   --   < >  --  196  --  231  --   --  243  --   LABPROT  --   --  19.2*  --   --  19.7*  --  19.7*  --   --   --   --   --   INR  --   --  1.61*  --   --  1.67*  --  1.67*  --   --   --   --   --   HEPARINUNFRC  --   --   --   --   --   --   --   --   --  <0.10*  --   --  0.34  CREATININE 1.30*  --   --  1.52*  --  1.47*  --  1.51*  --   --   --  1.40*  --   TROPONINI <0.03 <0.03  --  0.03  --   --   --   --   --   --   --   --   --   < > = values in this interval not displayed.  Estimated Creatinine Clearance: 59.4 mL/min (by C-G formula based on Cr of 1.4).   Assessment: 89 yom with hx afib on warf, now w/ new bilateral LE DVTs on dopplers. Warfarin held on admit for GIB and surgery for perforated duodenal ulcer. S/p OR for laparotomy. INR 8.68 on admit - reversed, now 1.67. Heparin started for new DVT/afib hx on 3/8. Hgb low but stable, ptl ok. No bleeding noted. Heparin level therapeutic x2 (0.54) on 1850 units/hr.  Goal of Therapy:  Heparin level 0.3-0.7 units/ml Monitor platelets by anticoagulation protocol:  Yes   Plan:  Continue heparin 1850 units/hr Daily HL/CBC Monitor s/sx bleeding  Elicia Lamp, PharmD, [facility] Clinical Pharmacist Pager [phone] 02/25/2016 8:18 AM

## 2016-02-25 NOTE — Evaluation (Signed)
Physical Therapy Evaluation Patient Details Name: [name] MRN: [id number] DOB: 1946/10/21 Today's Date: 02/25/2016   History of Present Illness  Patient is a 70 y/o male admitted with UGI bleeding & Perforated DU now s/p  Phillip Heal patch closure.  Also now with bilat DVT's.  PMH positive for recent TURP, a-fib, Chronic systolic CHF, Cardiomyopathy, ischemic-EF 20-25% w/ ICD.  Clinical Impression  Patient presents with decreased independence with mobility due to deficits listed in PT problem list.  He will benefit from skilled PT in the acute setting to allow return home with intermittent family support following SNF level rehab stay.    Follow Up Recommendations SNF;Supervision/Assistance - 24 hour    Equipment Recommendations  Rolling walker with 5" wheels    Recommendations for Other Services       Precautions / Restrictions Precautions Precautions: Fall Precaution Comments: enteric precautions, NGT to suction      Mobility  Bed Mobility Overal bed mobility: Needs Assistance Bed Mobility: Rolling;Sidelying to Sit;Sit to Sidelying Rolling: Mod assist Sidelying to sit: Mod assist     Sit to sidelying: +2 for physical assistance;Max assist General bed mobility comments: cues and assist to reach for rail and lift trunk and bring feet off bed, assist to lower trunk and bring legs on bed  Transfers Overall transfer level: Needs assistance Equipment used: Rolling walker (2 wheeled) Transfers: Sit to/from Stand Sit to Stand: Mod assist         General transfer comment: cues for hand placement and assist to lift from EOB  Ambulation/Gait Ambulation/Gait assistance: Mod assist Ambulation Distance (Feet): 2 Feet Assistive device: Rolling walker (2 wheeled)       General Gait Details: side steps up to [place]            Wheelchair Mobility    Modified Rankin (Stroke Patients Only)       Balance Overall balance assessment: Needs assistance    Sitting balance-[name] Scale: Fair     Standing balance support: Bilateral upper extremity supported Standing balance-[name] Scale: Poor Standing balance comment: UE support for balance                             Pertinent Vitals/Pain Pain Assessment: 0-10 Pain Score: 8  Pain Location: abdomen Pain Descriptors / Indicators: Sore;Grimacing Pain Intervention(s): Patient requesting pain meds-RN notified;Repositioned;Limited activity within patient's tolerance    Home Living Family/patient expects to be discharged to:: Private residence Living Arrangements: Spouse/significant other Available Help at Discharge: Family Type of Home: House Home Access: Level entry     Home Layout: One level Home Equipment: Grab bars - tub/shower      Prior Function Level of Independence: Independent               Hand Dominance        Extremity/Trunk Assessment   Upper Extremity Assessment: Generalized weakness           Lower Extremity Assessment: Generalized weakness      Cervical / Trunk Assessment: Kyphotic  Communication   Communication: No difficulties  Cognition Arousal/Alertness: Awake/alert Behavior During Therapy: WFL for tasks assessed/performed Overall Cognitive Status: Within Functional Limits for tasks assessed                      General Comments      Exercises        Assessment/Plan    PT Assessment Patient needs continued  PT services  PT Diagnosis Difficulty walking;Acute pain;Generalized weakness   PT Problem List Decreased strength;Decreased balance;Decreased mobility;Decreased activity tolerance;Decreased knowledge of use of DME;Decreased safety awareness;Pain  PT Treatment Interventions DME instruction;Balance training;Gait training;Functional mobility training;Patient/family education;Therapeutic activities;Therapeutic exercise   PT Goals (Current goals can be found in the Care Plan section) Acute Rehab PT Goals Patient  Stated Goal: To go home PT Goal Formulation: With patient Time For Goal Achievement: 03/10/16 Potential to Achieve Goals: Good    Frequency Min 3X/week   Barriers to discharge        Co-evaluation               End of Session   Activity Tolerance: Patient limited by pain;Patient limited by fatigue Patient left: in bed;with call bell/phone within reach           Time: 1545-1625 PT Time Calculation (min) (ACUTE ONLY): 40 min   Charges:   PT Evaluation $PT Eval Moderate Complexity: 1 Procedure PT Treatments $Therapeutic Activity: 8-22 mins   PT G Codes:        Reginia Naas 03-21-16, 5:34 PM  Magda Kiel, [place] Mar 21, 2016

## 2016-02-25 NOTE — Progress Notes (Signed)
[facility] ADULT ICU REPLACEMENT PROTOCOL FOR AM LAB REPLACEMENT ONLY  The patient does apply for the [facility] Adult ICU Electrolyte Replacment Protocol based on the criteria listed below:   1. Is GFR >/= 40 ml/min? Yes.    Patient's GFR today is 50 2. Is urine output >/= 0.5 ml/kg/hr for the last 6 hours? Yes.   Patient's UOP is 0.7 ml/kg/hr 3. Is BUN < 60 mg/dL? Yes.    Patient's BUN today is 17 4. Abnormal electrolyte(s): K3.4 5. Ordered repletion with: per protocol 6. If a panic level lab has been reported, has the CCM MD in charge been notified? Yes.  .   Physician:  Elder Cyphers, MD  [facility] 02/25/2016 5:05 AM

## 2016-02-25 NOTE — Progress Notes (Signed)
Pt's wife concerned about using Cipro >> she thinks prior use of cipro is what triggered all his current problems.  Will d/c cipro, and resume zosyn for intra-abdominal infection.  Continue flagyl for C diff tx.   Chesley Mires, MD [facility] Pulmonary/Critical Care 02/25/2016, 12:09 PM Pager:  [phone] After 3pm call: [phone]

## 2016-02-25 NOTE — Progress Notes (Signed)
ANTICOAGULATION CONSULT NOTE - Follow-up Consult  Pharmacy Consult for heparin Indication: new DVT, history of afib  Allergies  Allergen Reactions  . Keflex [Cephalexin] Shortness Of Breath and Other (See Comments)    dizziness    Patient Measurements: Height: 5\' 10"  (177.8 cm) Weight: 216 lb 11.4 oz (98.3 kg) IBW/kg (Calculated) : 73 Heparin Dosing Weight: 93.1kg  Vital Signs: Temp: 99.1 F (37.3 C) (03/09 0415) Temp Source: Oral (03/09 0415) BP: 116/66 mmHg (03/09 0400) Pulse Rate: 109 (03/09 0400)  Labs:  Recent Labs  02/22/16 0932 02/22/16 1338 02/22/16 1607 02/22/16 2300  02/23/16 0330 02/23/16 0808 02/24/16 0450 02/24/16 0846 02/24/16 1825 02/24/16 2000 02/25/16 0206 02/25/16 0209  HGB  --   --   --   --   < >  --  7.9*  --  7.1*  --  9.3* 9.3*  --   HCT  --   --   --   --   < >  --  23.9*  --  21.2*  --  27.5* 27.2*  --   PLT  --   --   --   --   < >  --  196  --  231  --   --  243  --   LABPROT  --   --  19.2*  --   --  19.7*  --  19.7*  --   --   --   --   --   INR  --   --  1.61*  --   --  1.67*  --  1.67*  --   --   --   --   --   HEPARINUNFRC  --   --   --   --   --   --   --   --   --  <0.10*  --   --  0.34  CREATININE 1.30*  --   --  1.52*  --  1.47*  --  1.51*  --   --   --  1.40*  --   TROPONINI <0.03 <0.03  --  0.03  --   --   --   --   --   --   --   --   --   < > = values in this interval not displayed.  Estimated Creatinine Clearance: 59.4 mL/min (by C-G formula based on Cr of 1.4).   Assessment: 48 yom with hx afib on warf, now w/ new bilateral LE DVTs on dopplers. Warfarin held on admit for GIB and surgery for perforated duodenal ulcer. S/p OR for laparotomy. INR 8.68 on admit - reversed, now 1.67 this AM. Heparin started for new DVT/afib hx. Hgb low but stable, ptl ok. No bleeding noted. Heparin level therapeutic (0.34) on 1850 units/hr.  Goal of Therapy:  Heparin level 0.3-0.7 units/ml Monitor platelets by anticoagulation protocol:  Yes   Plan:  Continue heparin 1850 units/hr Will f/u 6 hr confirmatory level  Sherlon Handing, PharmD, BCPS Clinical pharmacist, pager [phone] 02/25/2016 4:36 AM

## 2016-02-26 DIAGNOSIS — K922 Gastrointestinal hemorrhage, unspecified: Secondary | ICD-10-CM | POA: Diagnosis present

## 2016-02-26 LAB — PROTIME-INR
INR: 1.35 (ref 0.00–1.49)
PROTHROMBIN TIME: 16.8 s — AB (ref 11.6–15.2)

## 2016-02-26 LAB — BASIC METABOLIC PANEL
ANION GAP: 10 (ref 5–15)
BUN: 16 mg/dL (ref 6–20)
CALCIUM: 8.2 mg/dL — AB (ref 8.9–10.3)
CO2: 23 mmol/L (ref 22–32)
CREATININE: 1.07 mg/dL (ref 0.61–1.24)
Chloride: 109 mmol/L (ref 101–111)
Glucose, Bld: 130 mg/dL — ABNORMAL HIGH (ref 65–99)
Potassium: 3.5 mmol/L (ref 3.5–5.1)
SODIUM: 142 mmol/L (ref 135–145)

## 2016-02-26 LAB — GLUCOSE, CAPILLARY
GLUCOSE-CAPILLARY: 131 mg/dL — AB (ref 65–99)
GLUCOSE-CAPILLARY: 144 mg/dL — AB (ref 65–99)
Glucose-Capillary: 128 mg/dL — ABNORMAL HIGH (ref 65–99)
Glucose-Capillary: 129 mg/dL — ABNORMAL HIGH (ref 65–99)

## 2016-02-26 LAB — CBC
HCT: 28.9 % — ABNORMAL LOW (ref 39.0–52.0)
HEMOGLOBIN: 9.1 g/dL — AB (ref 13.0–17.0)
MCH: 25.8 pg — AB (ref 26.0–34.0)
MCHC: 31.5 g/dL (ref 30.0–36.0)
MCV: 81.9 fL (ref 78.0–100.0)
PLATELETS: 280 10*3/uL (ref 150–400)
RBC: 3.53 MIL/uL — AB (ref 4.22–5.81)
RDW: 15.7 % — ABNORMAL HIGH (ref 11.5–15.5)
WBC: 11.3 10*3/uL — AB (ref 4.0–10.5)

## 2016-02-26 LAB — PHOSPHORUS: PHOSPHORUS: 2.3 mg/dL — AB (ref 2.5–4.6)

## 2016-02-26 LAB — HEPARIN LEVEL (UNFRACTIONATED)
HEPARIN UNFRACTIONATED: 0.16 [IU]/mL — AB (ref 0.30–0.70)
HEPARIN UNFRACTIONATED: 0.32 [IU]/mL (ref 0.30–0.70)

## 2016-02-26 MED ORDER — DEXTROSE 5 % IV SOLN
20.0000 mmol | Freq: Once | INTRAVENOUS | Status: AC
  Administered 2016-02-26: 20 mmol via INTRAVENOUS
  Filled 2016-02-26 (×2): qty 6.67

## 2016-02-26 MED ORDER — IPRATROPIUM-ALBUTEROL 0.5-2.5 (3) MG/3ML IN SOLN: 3.0000 mL | Freq: Four times a day (QID) | RESPIRATORY_TRACT | Status: DC

## 2016-02-26 MED ORDER — ALBUTEROL SULFATE (2.5 MG/3ML) 0.083% IN NEBU: 2.5000 mg | INHALATION_SOLUTION | RESPIRATORY_TRACT | Status: DC | PRN

## 2016-02-26 MED ORDER — METOPROLOL TARTRATE 1 MG/ML IV SOLN
5.0000 mg | Freq: Four times a day (QID) | INTRAVENOUS | Status: DC
  Administered 2016-02-26 – 2016-02-27 (×5): 5 mg via INTRAVENOUS
  Filled 2016-02-26 (×4): qty 5

## 2016-02-26 MED ORDER — TRACE MINERALS CR-CU-MN-SE-ZN 10-1000-500-60 MCG/ML IV SOLN
INTRAVENOUS | Status: AC
  Administered 2016-02-26: 18:00:00 via INTRAVENOUS
  Filled 2016-02-26: qty 1920

## 2016-02-26 MED ORDER — FAT EMULSION 20 % IV EMUL
240.0000 mL | INTRAVENOUS | Status: AC
  Administered 2016-02-26: 240 mL via INTRAVENOUS
  Filled 2016-02-26: qty 250

## 2016-02-26 MED ORDER — IPRATROPIUM-ALBUTEROL 0.5-2.5 (3) MG/3ML IN SOLN
3.0000 mL | Freq: Four times a day (QID) | RESPIRATORY_TRACT | Status: DC
  Administered 2016-02-27: 3 mL via RESPIRATORY_TRACT
  Filled 2016-02-26: qty 3

## 2016-02-26 NOTE — NC FL2 (Signed)
[place] MEDICAID FL2 LEVEL OF CARE SCREENING TOOL     IDENTIFICATION  [name] Birthdate: 07-27-1946 Sex: male Admission Date (Current Location): 02/18/2016  [facility] and [place] Number:  Herbalist and Address:  [facility] [place], [place] [address], [place],  [address]      Provider Number: [id number]  Attending Physician Name and Address:  Allie Bossier, MD  Relative Name and Phone Number:       Current Level of Care: Hospital Recommended Level of Care: [place] Prior Approval Number:    Date Approved/Denied: 02/26/16 PASRR Number: [id number]  Discharge Plan: SNF    Current Diagnoses: Patient Active Problem List   Diagnosis Date Noted  . Duodenal ulcer with perforation ([place]) 02/23/2016  . Abdominal pain   . Cardiomyopathy, ischemic-EF 20-25% 02/22/2016  . Chronic anticoagulation-Couamdin 02/22/2016  . Epigastric pain 02/22/2016  . C. difficile colitis 02/22/2016  . Acute respiratory failure with hypoxia ([place])   . Perforated ulcer ([place])   . GI bleed 02/18/2016  . Hypotension 02/18/2016  . Symptomatic anemia 02/18/2016  . Chronic a-fib ([place]) 02/18/2016  . S/P TURP 02/18/2016  . CKD (chronic kidney disease) stage 3, GFR 30-59 ml/min 02/18/2016  . Abdominal cramping, generalized   . Absolute anemia   . Supratherapeutic INR   . BPH- s/p TURP 01/25/16 - followed by 4 weeks of Cipro 01/25/2016  . Other primary cardiomyopathies   . Noncompliance 06/08/2011  . 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 Sleep apnea 04/05/2011  . DYSLIPIDEMIA 03/09/2010  . Essential hypertension 03/09/2010  . CAD S/P LAD PCI- 2000 03/09/2010  . Atrial fibrillation ([place]) 03/09/2010  . Chronic systolic CHF (congestive heart failure) ([place]) 03/09/2010  . RENAL INSUFFICIENCY 03/09/2010    Orientation RESPIRATION BLADDER Height & Weight     Self, Situation, Time, Place  Normal  Indwelling catheter Weight: 216 lb 11.4 oz (98.3 kg) Height:  5\' 10"  (177.8 cm)  BEHAVIORAL SYMPTOMS/MOOD NEUROLOGICAL BOWEL NUTRITION STATUS      Continent Diet  AMBULATORY STATUS COMMUNICATION OF NEEDS Skin   Extensive Assist Verbally Other (Comment) (Incisions)                       Personal Care Assistance Level of Assistance  Bathing, Feeding, Dressing Bathing Assistance: Maximum assistance Feeding assistance: Independent Dressing Assistance: Maximum assistance     Functional Limitations Info  Sight, Hearing, Speech Sight Info: Adequate Hearing Info: Adequate Speech Info: Adequate    SPECIAL CARE FACTORS FREQUENCY  PT (By licensed PT), OT (By licensed OT)                    Contractures      Additional Factors Info  Code Status, Allergies Code Status Info: FULL Allergies Info: Keflex           Current Medications (02/26/2016):  This is the current hospital active medication list Current Facility-Administered Medications  Medication Dose Route Frequency Provider Last Rate Last Dose  . antiseptic oral rinse solution (CORINZ)  7 mL Mouth Rinse QID Javier Glazier, MD   7 mL at 02/26/16 1127  . chlorhexidine gluconate (PERIDEX) 0.12 % solution 15 mL  15 mL Mouth Rinse BID Javier Glazier, MD   15 mL at 02/25/16 2309  . TPN (CLINIMIX-E) Adult   Intravenous Continuous TPN Tyrone Apple, RPH 80 mL/hr at 02/25/16 1709     And  . fat  emulsion 20 % infusion 240 mL  240 mL Intravenous Continuous TPN Tyrone Apple, RPH 10 mL/hr at 02/25/16 1709 240 mL at 02/25/16 1709  . TPN (CLINIMIX-E) Adult   Intravenous Continuous TPN Kendra P Hiatt, RPH       And  . fat emulsion 20 % infusion 240 mL  240 mL Intravenous Continuous TPN Kendra P Hiatt, RPH      . fentaNYL (SUBLIMAZE) injection 25-50 mcg  25-50 mcg Intravenous Q2H PRN Chesley Mires, MD   50 mcg at 02/26/16 1308  . furosemide (LASIX) injection 20 mg  20 mg Intravenous Daily Leonie Man, MD   20 mg at 02/26/16  1028  . heparin ADULT infusion 100 units/mL (25000 units/250 mL)  2,100 Units/hr Intravenous Continuous Kimberly B Hammons, RPH 21 mL/hr at 02/26/16 1007 2,100 Units/hr at 02/26/16 1007  . insulin aspart (novoLOG) injection 0-9 Units  0-9 Units Subcutaneous 4 times per day Tyrone Apple, [facility]   2 Units at 02/25/16 1854  . ipratropium-albuterol (DUONEB) 0.5-2.5 (3) MG/3ML nebulizer solution 3 mL  3 mL Nebulization Q2H PRN Chesley Mires, MD      . metoprolol (LOPRESSOR) injection 5 mg  5 mg Intravenous 4 times per day Leonie Man, MD   5 mg at 02/26/16 1126  . metroNIDAZOLE (FLAGYL) IVPB 500 mg  500 mg Intravenous Q8H Chesley Mires, MD   500 mg at 02/26/16 1036  . ondansetron (ZOFRAN) injection 4 mg  4 mg Intravenous Q6H PRN [name], MD   4 mg at 02/22/16 [id number]  . pantoprazole (PROTONIX) 80 mg in sodium chloride 0.9 % 100 mL IVPB  80 mg Intravenous Q12H Nat Christen, PA-C   80 mg at 02/26/16 1128  . piperacillin-tazobactam (ZOSYN) IVPB 3.375 g  3.375 g Intravenous 3 times per day Lyndee Leo, RPH   3.375 g at 02/26/16 0640  . potassium phosphate 20 mmol in dextrose 5 % 500 mL infusion  20 mmol Intravenous Once [employer], RPH   20 mmol at 02/26/16 1257  . promethazine (PHENERGAN) injection 12.5 mg  12.5 mg Intravenous Q6H PRN Cherene Altes, MD   12.5 mg at 02/22/16 1226  . sodium chloride flush (NS) 0.9 % injection 10-40 mL  10-40 mL Intracatheter [place], MD   10 mL at 02/25/16 1005  . sodium chloride flush (NS) 0.9 % injection 10-40 mL  10-40 mL Intracatheter PRN Jose Shirl Harris, MD         Discharge Medications: Please see discharge summary for a list of discharge medications.  Relevant Imaging Results:  Relevant Lab Results:   Additional Information SS#: [id number]  Raymondo Band, LCSW

## 2016-02-26 NOTE — Progress Notes (Signed)
ANTICOAGULATION CONSULT NOTE - Follow Up Consult  Pharmacy Consult for Heparin Indication: new bilateral DVT, hx of afib (Coumadin on hold)  Allergies  Allergen Reactions  . Keflex [Cephalexin] Shortness Of Breath and Other (See Comments)    dizziness    Patient Measurements: Height: 5\' 10"  (177.8 cm) Weight: 216 lb 11.4 oz (98.3 kg) IBW/kg (Calculated) : 73 Heparin Dosing Weight: 93 kg  Vital Signs: Temp: 98.2 F (36.8 C) (03/10 0744) Temp Source: Oral (03/10 0744) BP: 118/70 mmHg (03/10 0744) Pulse Rate: 103 (03/10 0744)  Labs:  Recent Labs  02/24/16 0450  02/24/16 0846  02/24/16 2000 02/25/16 0206 02/25/16 0209 02/25/16 0745 02/26/16 0532  HGB  --   < > 7.1*  --  9.3* 9.3*  --   --  9.1*  HCT  --   < > 21.2*  --  27.5* 27.2*  --   --  28.9*  PLT  --   --  231  --   --  243  --   --  280  LABPROT 19.7*  --   --   --   --   --   --  18.5* 16.8*  INR 1.67*  --   --   --   --   --   --  1.54* 1.35  HEPARINUNFRC  --   --   --   < >  --   --  0.34 0.54 0.16*  CREATININE 1.51*  --   --   --   --  1.40*  --   --  1.07  < > = values in this interval not displayed.  Estimated Creatinine Clearance: 77.7 mL/min (by C-G formula based on Cr of 1.07).   Medications:  Infusions:  . Marland KitchenTPN (CLINIMIX-E) Adult 80 mL/hr at 02/25/16 1709   And  . fat emulsion 240 mL (02/25/16 1709)  . heparin 1,850 Units/hr (02/26/16 [id number])    Assessment: 70 yo M with hx of afib on Coumadin PTA which was held on admit for GIB and surgery for perforated duodenal ulcer.  Now s/p OR.  New bilateral LE DVTs started on heparin infusion 3/8.  Heparin level is subtherapeutic on 1850 units/hr.  No IV issues noted.  Goal of Therapy:  Heparin level 0.3-0.7 units/ml Monitor platelets by anticoagulation protocol: Yes   Plan:  Increase heparin to 2100 units/hr Heparin level in 6 hours Daily heparin level and CBC Follow-up plans for restarting Coumadin  [employer], Pharm.D., BCPS Clinical  Pharmacist Pager [phone] 02/26/2016 10:06 AM

## 2016-02-26 NOTE — Care Management Note (Signed)
Case Management Note  Patient Details  Name: [name] MRN: [id number] Date of Birth: [birth year]  Subjective/Objective:   Patient is from home with wife, duodenal perf ulcer, ng tube, picc- tpn, pt eval rec SNF, CSW referral.                  Action/Plan:   Expected Discharge Date:  02/21/16               Expected Discharge Plan:  Skilled Nursing Facility  In-House Referral:  Clinical Social Work  Discharge planning Services  CM Consult  Post Acute Care Choice:    Choice offered to:     DME Arranged:    DME Agency:     HH Arranged:    [place] Agency:     Status of Service:  In process, will continue to follow  Medicare Important Message Given:    Date Medicare IM Given:    Medicare IM give by:    Date Additional Medicare IM Given:    Additional Medicare Important Message give by:     If discussed at [place] of Stay Meetings, dates discussed:    Additional Comments:  Zenon Mayo, RN 02/26/2016, 3:30 PM

## 2016-02-26 NOTE — Progress Notes (Addendum)
PARENTERAL NUTRITION CONSULT NOTE - FOLLOW UP  Pharmacy Consult for TPN Indication: Prolonged ileus  Allergies  Allergen Reactions  . Keflex [Cephalexin] Shortness Of Breath and Other (See Comments)    dizziness    Patient Measurements: Height: _0  (177.8 cm) Weight: 216 lb 11.4 oz (98.3 kg) IBW/kg (Calculated) : 73 Adjusted Body Weight:  Usual Weight:   Vital Signs: Temp: 98.2 F (36.8 C) (03/10 0744) Temp Source: Oral (03/10 0744) BP: 118/70 mmHg (03/10 0744) Pulse Rate: 103 (03/10 0744) Intake/Output from previous day: 03/09 0701 - 03/10 0700 In: 3046.5 [I.V.:443.5; IV Piggyback:900; DQQ:2297] Out: 1280 [Urine:1245; Emesis/NG output:25; Drains:10] Intake/Output from this shift: Total I/O In: -  Out: 467.5 [Urine:350; Emesis/NG output:110; Drains:7.5]  Labs:  Recent Labs  02/24/16 0450  02/24/16 0846 02/24/16 2000 02/25/16 0206 02/25/16 0745 02/26/16 0532  WBC  --   --  12.5*  --  13.2*  --  11.3*  HGB  --   < > 7.1* 9.3* 9.3*  --  9.1*  HCT  --   < > 21.2* 27.5* 27.2*  --  28.9*  PLT  --   --  231  --  243  --  280  INR 1.67*  --   --   --   --  1.54* 1.35  < > = values in this interval not displayed.   Recent Labs  02/24/16 0450 02/25/16 0206 02/26/16 0532  NA 143 143 142  K 4.0 3.4* 3.5  CL 112* 107 109  CO2 21* 24 23  GLUCOSE 99 141* 130*  BUN _1 CREATININE 1.51* 1.40* 1.07  CALCIUM 8.4* 8.5* 8.2*  MG  --  2.1  --   PHOS  --  2.6 2.3*  PROT 5.1* 5.6*  --   ALBUMIN 2.0* 2.1*  --   AST 28 18  --   ALT 19 16*  --   ALKPHOS 37* 35*  --   BILITOT 0.8 0.6  --   PREALBUMIN  --  8.3*  --   TRIG 78  --   --    Estimated Creatinine Clearance: 77.7 mL/min (by C-G formula based on Cr of 1.07).    Recent Labs  02/25/16 1837 02/25/16 2355 02/26/16 0553  GLUCAP 155* 144* 128*    Medications:  Scheduled:  . antiseptic oral rinse  7 mL Mouth Rinse QID  . chlorhexidine gluconate  15 mL Mouth Rinse BID  . furosemide  20 mg  Intravenous Daily  . insulin aspart  0-9 Units Subcutaneous 4 times per day  . metoprolol  2.5 mg Intravenous 4 times per day  . metronidazole  500 mg Intravenous Q8H  . pantoprazole (PROTONIX) IV  80 mg Intravenous Q12H  . piperacillin-tazobactam (ZOSYN)  IV  3.375 g Intravenous 3 times per day  . sodium chloride flush  10-40 mL Intracatheter Q12H    Insulin Requirements in the past 24 hours:  4 unit sens SSI yesterday  Assessment: 62 YOM presented on 02/18/16 with hypotension and dizziness from GIB. Found to have perforated duodenum and was taken to the OR on 02/22/16 for ex-lap with Phillip Heal patch of duodenal ulcer. Pharmacy consulted to initiate TPN for nutritional support given expected prolonged bowel rest.  GI: admitted with GIB on high dose PPI IV BID, baseline prealbumin low at 8.3.Abd softer with neg pressure dressing, serosang output from JP drain. Plan to go slow with diet.  Emesis/NG O/P 79m, drains 120mEndo: TSH WNL. No hx  DM - CBGs controlled Lytes: K+ 3.5 (2 runs 3/9, goal >/= 4 for ileus), Phos 2.3 (decreased, s/p K-Phos 54mol on 3/9- charted), o/w wnl. Renal: CKD3, s/p recent TURP (2/6) - SCr improved to 1.07, BUN WNL.  UOP 0.6 ml/kg/hr, NS at 10 ml/hr.  I/O 1.7L (3/10:  net positive 3L since admit) Pulm: intubated for procedure, extubated 3/7 to RA, required Irene-2L briefly on 3/9, but now on RA again - Duonebs Cards: CAD / ICM / AFib / DL / AICD / CHF - BP soft, tachy (in Afib, & anemic).  No active CHF & CXR clear - Lopressor IV, Lasix 229mIV daily AC: Coumadin PTA for hx AFib >> held d/t GIB. New DVT on 3/7 >> heparin gtt started 3/8 - anemic but stable (anemia panel WNL), plts WNL, INR 1.67  Hepatobil: mild elevated alk phos, others WNL. TG WNL. Neuro: depression - A&O, pain score 0-7, PRN Fentanyl available Heme:  Anemia 2nd GIB, PRBC x 2 on 3/9 with Hg improved to 9.3 ID: PO Vanc for C.diff colitis + Zosyn for intra-abd infxn, long course of Cipro PTA - afebrile,  WBC 11.3 Best Practices: heparin gtt TPN Access: right IJ placed 02/22/16, PICC placed 02/24/16 TPN start date: 02/24/16  Current Nutrition:  TPN  Nutritional Goals:  2000-2200 kCal, 120-130 grams of protein per day  Plan:  - Cont Clinimix E 5/15 to 80 ml/hr (goal 100 ml/hr) and continue ILE 20% at 10 ml/hr. Will not be able to concentrate TPN given limitation of premixed Clinimix. Watch volume, respiratory status - Daily multivitamin, folate and trace elements in TPN - Continue sensitive SSI Q6H. D/C if CBGs remain controlled at goal TPN rate. - KPhos 20 mmol IV x 1 - F/U AM labs   KeGracy BruinsPharmD Clinical Pharmacist [facility]

## 2016-02-26 NOTE — Progress Notes (Signed)
PT Cancellation Note  Patient Details Name: [name] MRN: [id number] DOB: November 05, 1946   Cancelled Treatment:    Reason Eval/Treat Not Completed: Medical issues which prohibited therapy; patient just back to bed and medicated for abdominal dressing change.  Will attempt again another day.   Reginia Naas 02/26/2016, 2:50 PM  [place], [place] 02/26/2016

## 2016-02-26 NOTE — Progress Notes (Signed)
Patient ID: [name], male   DOB: 11/24/1946, 70 y.o.   MRN: [id number]     [facility]      [address] [place], [place] [address]    Phone: [phone] FAX: [phone]     Subjective: No flatus. No vomiting.  Feels "queezy" and "full."  Afebrile.  VSS.  H&h are stable.   Objective:  Vital signs:  Filed Vitals:   02/25/16 2334 02/26/16 0000 02/26/16 0400 02/26/16 0404  BP: 125/77   106/79  Pulse: 108   120  Temp:  98.3 F (36.8 C) 98.4 F (36.9 C)   TempSrc:  Oral Oral   Resp:      Height:      Weight:      SpO2: 93%   93%    Last BM Date: 02/21/16  Intake/Output   Yesterday:  03/09 0701 - 03/10 0700 In: 3046.5 [I.V.:443.5; IV Piggyback:900; OTL:5726] Out: 1280 [Urine:1245; Emesis/NG output:25; Drains:10] This shift:     Physical Exam: General: Pt awake/alert/oriented x4 in no acute distress  Abdomen: +BS. Soft. Nondistended. Mildly tender at incisions only. Vac to midline wound. No evidence of peritonitis. No incarcerated hernias. jp drain sanguinous output, dressing changed.    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 difficile colitis   Acute respiratory failure with hypoxia (HCC)   Perforated ulcer (HCC)   Duodenal ulcer with perforation (HCC)   Abdominal pain    Results:   Labs: Results for orders placed or performed during the hospital encounter of 02/18/16 (from the past 48 hour(s))  CBC     Status: Abnormal   Collection Time: 02/24/16  8:46 AM  Result Value Ref Range   WBC 12.5 (H) 4.0 - 10.5 K/uL   RBC 2.61 (L) 4.22 - 5.81 MIL/uL   Hemoglobin 7.1 (L) 13.0 - 17.0 g/dL   HCT 21.2 (L) 39.0 - 52.0 %   MCV 81.2 78.0 - 100.0 fL   MCH 27.2 26.0 - 34.0 pg   MCHC 33.5 30.0 - 36.0 g/dL   RDW 15.6 (H) 11.5 - 15.5 %   Platelets 231 150 - 400 K/uL  Prepare RBC     Status: None   Collection Time: 02/24/16 12:08 PM  Result Value Ref Range   Order Confirmation ORDER PROCESSED BY BLOOD BANK   Glucose, capillary     Status: None   Collection Time: 02/24/16  6:04 PM  Result Value Ref Range   Glucose-Capillary 94 65 - 99 mg/dL   Comment 1 Notify RN   Heparin level (unfractionated)     Status: Abnormal   Collection Time: 02/24/16  6:25 PM  Result Value Ref Range   Heparin Unfractionated <0.10 (L) 0.30 - 0.70 IU/mL    Comment:        IF HEPARIN RESULTS ARE BELOW EXPECTED VALUES, AND PATIENT DOSAGE HAS BEEN CONFIRMED, SUGGEST FOLLOW UP TESTING OF ANTITHROMBIN III LEVELS.   Hemoglobin and hematocrit, blood  Status: Abnormal   Collection Time: 02/24/16  8:00 PM  Result Value Ref Range   Hemoglobin 9.3 (L) 13.0 - 17.0 g/dL    Comment: POST TRANSFUSION SPECIMEN   HCT 27.5 (L) 39.0 - 52.0 %  Glucose, capillary     Status: Abnormal   Collection Time: 02/24/16 11:53 PM  Result Value Ref Range   Glucose-Capillary 134 (H) 65 - 99 mg/dL   Comment 1 Capillary Specimen    Comment 2 Notify RN    Comment 3 Document in Chart   Comprehensive metabolic panel     Status: Abnormal   Collection Time: 02/25/16  2:06 AM  Result Value Ref Range   Sodium 143 135 - 145 mmol/L   Potassium 3.4 (L) 3.5 - 5.1 mmol/L   Chloride 107 101 - 111 mmol/L   CO2 24 22 - 32 mmol/L   Glucose, Bld 141 (H) 65 - 99 mg/dL   BUN 17 6 - 20 mg/dL   Creatinine, Ser 1.40 (H) 0.61 - 1.24 mg/dL   Calcium 8.5 (L) 8.9 - 10.3 mg/dL   Total Protein 5.6 (L) 6.5 - 8.1 g/dL   Albumin 2.1 (L) 3.5 - 5.0 g/dL   AST 18 15 - 41 U/L   ALT 16 (L)  17 - 63 U/L   Alkaline Phosphatase 35 (L) 38 - 126 U/L   Total Bilirubin 0.6 0.3 - 1.2 mg/dL   GFR calc non Af Amer 50 (L) >60 mL/min   GFR calc Af Amer 58 (L) >60 mL/min    Comment: (NOTE) The eGFR has been calculated using the CKD EPI equation. This calculation has not been validated in all clinical situations. eGFR's persistently <60 mL/min signify possible Chronic Kidney Disease.    Anion gap 12 5 - 15  Prealbumin     Status: Abnormal   Collection Time: 02/25/16  2:06 AM  Result Value Ref Range   Prealbumin 8.3 (L) 18 - 38 mg/dL  Magnesium     Status: None   Collection Time: 02/25/16  2:06 AM  Result Value Ref Range   Magnesium 2.1 1.7 - 2.4 mg/dL  Phosphorus     Status: None   Collection Time: 02/25/16  2:06 AM  Result Value Ref Range   Phosphorus 2.6 2.5 - 4.6 mg/dL  Differential     Status: Abnormal   Collection Time: 02/25/16  2:06 AM  Result Value Ref Range   Neutrophils Relative % 83 %   Neutro Abs 10.9 (H) 1.7 - 7.7 K/uL   Lymphocytes Relative 8 %   Lymphs Abs 1.1 0.7 - 4.0 K/uL   Monocytes Relative 6 %   Monocytes Absolute 0.9 0.1 - 1.0 K/uL   Eosinophils Relative 3 %   Eosinophils Absolute 0.4 0.0 - 0.7 K/uL   Basophils Relative 0 %   Basophils Absolute 0.0 0.0 - 0.1 K/uL  CBC     Status: Abnormal   Collection Time: 02/25/16  2:06 AM  Result Value Ref Range   WBC 13.2 (H) 4.0 - 10.5 K/uL   RBC 3.34 (L) 4.22 - 5.81 MIL/uL   Hemoglobin 9.3 (L) 13.0 - 17.0 g/dL   HCT 27.2 (L) 39.0 - 52.0 %   MCV 81.4 78.0 - 100.0 fL   MCH 27.8 26.0 - 34.0 pg   MCHC 34.2 30.0 - 36.0 g/dL   RDW 15.1 11.5 - 15.5 %   Platelets 243 150 - 400 K/uL  Heparin level (unfractionated)     Status: None  Collection Time: 02/25/16  2:09 AM  Result Value Ref Range   Heparin Unfractionated 0.34 0.30 - 0.70 IU/mL    Comment:        IF HEPARIN RESULTS ARE BELOW EXPECTED VALUES, AND PATIENT DOSAGE HAS BEEN CONFIRMED, SUGGEST FOLLOW UP TESTING OF ANTITHROMBIN III LEVELS.   Glucose,  capillary     Status: Abnormal   Collection Time: 02/25/16  6:23 AM  Result Value Ref Range   Glucose-Capillary 124 (H) 65 - 99 mg/dL   Comment 1 Capillary Specimen    Comment 2 Notify RN    Comment 3 Document in Chart   Heparin level (unfractionated)     Status: None   Collection Time: 02/25/16  7:45 AM  Result Value Ref Range   Heparin Unfractionated 0.54 0.30 - 0.70 IU/mL    Comment:        IF HEPARIN RESULTS ARE BELOW EXPECTED VALUES, AND PATIENT DOSAGE HAS BEEN CONFIRMED, SUGGEST FOLLOW UP TESTING OF ANTITHROMBIN III LEVELS.   Protime-INR     Status: Abnormal   Collection Time: 02/25/16  7:45 AM  Result Value Ref Range   Prothrombin Time 18.5 (H) 11.6 - 15.2 seconds   INR 1.54 (H) 0.00 - 1.49  Glucose, capillary     Status: Abnormal   Collection Time: 02/25/16 11:49 AM  Result Value Ref Range   Glucose-Capillary 124 (H) 65 - 99 mg/dL   Comment 1 Document in Chart   Glucose, capillary     Status: Abnormal   Collection Time: 02/25/16  6:37 PM  Result Value Ref Range   Glucose-Capillary 155 (H) 65 - 99 mg/dL  Glucose, capillary     Status: Abnormal   Collection Time: 02/25/16 11:55 PM  Result Value Ref Range   Glucose-Capillary 144 (H) 65 - 99 mg/dL  Heparin level (unfractionated)     Status: Abnormal   Collection Time: 02/26/16  5:32 AM  Result Value Ref Range   Heparin Unfractionated 0.16 (L) 0.30 - 0.70 IU/mL    Comment:        IF HEPARIN RESULTS ARE BELOW EXPECTED VALUES, AND PATIENT DOSAGE HAS BEEN CONFIRMED, SUGGEST FOLLOW UP TESTING OF ANTITHROMBIN III LEVELS.   Basic metabolic panel     Status: Abnormal   Collection Time: 02/26/16  5:32 AM  Result Value Ref Range   Sodium 142 135 - 145 mmol/L   Potassium 3.5 3.5 - 5.1 mmol/L   Chloride 109 101 - 111 mmol/L   CO2 23 22 - 32 mmol/L   Glucose, Bld 130 (H) 65 - 99 mg/dL   BUN 16 6 - 20 mg/dL   Creatinine, Ser 1.07 0.61 - 1.24 mg/dL   Calcium 8.2 (L) 8.9 - 10.3 mg/dL   GFR calc non Af Amer >60 >60 mL/min    GFR calc Af Amer >60 >60 mL/min    Comment: (NOTE) The eGFR has been calculated using the CKD EPI equation. This calculation has not been validated in all clinical situations. eGFR's persistently <60 mL/min signify possible Chronic Kidney Disease.    Anion gap 10 5 - 15  CBC     Status: Abnormal   Collection Time: 02/26/16  5:32 AM  Result Value Ref Range   WBC 11.3 (H) 4.0 - 10.5 K/uL   RBC 3.53 (L) 4.22 - 5.81 MIL/uL   Hemoglobin 9.1 (L) 13.0 - 17.0 g/dL   HCT 28.9 (L) 39.0 - 52.0 %   MCV 81.9 78.0 - 100.0 fL   MCH 25.8 (L)  26.0 - 34.0 pg   MCHC 31.5 30.0 - 36.0 g/dL   RDW 15.7 (H) 11.5 - 15.5 %   Platelets 280 150 - 400 K/uL  Phosphorus     Status: Abnormal   Collection Time: 02/26/16  5:32 AM  Result Value Ref Range   Phosphorus 2.3 (L) 2.5 - 4.6 mg/dL  Protime-INR     Status: Abnormal   Collection Time: 02/26/16  5:32 AM  Result Value Ref Range   Prothrombin Time 16.8 (H) 11.6 - 15.2 seconds   INR 1.35 0.00 - 1.49  Glucose, capillary     Status: Abnormal   Collection Time: 02/26/16  5:53 AM  Result Value Ref Range   Glucose-Capillary 128 (H) 65 - 99 mg/dL    Imaging / Studies: Dg Chest Port 1 View  02/24/2016  CLINICAL DATA:  Confirmed tip of the left-sided PICC line. EXAM: PORTABLE CHEST 1 VIEW COMPARISON:  Chest x-ray of earlier today. FINDINGS: The PICC line tip appears to have been withdrawn partially sensitive tip overlies the distal third of the SVC. The right internal jugular venous catheter tip projects over the midportion of the SVC. The other support tubes and lines are in reasonable position. IMPRESSION: The PICC line tip has been withdrawn approximately 2 cm and appears to overlie the distal third of the SVC. Electronically Signed   By: David  Martinique M.D.   On: 02/24/2016 15:44   Dg Chest Port 1 View  02/24/2016  CLINICAL DATA:  Status post PICC line placement EXAM: PORTABLE CHEST 1 VIEW COMPARISON:  02/24/2016 FINDINGS: Nasogastric catheter, defibrillator  and right jugular central line are again seen and stable. A new left-sided PICC line is noted with the catheter tip in the mid right atrium. This could be withdrawn 2-3 cm. Cardiac shadow is stable. The lungs are hypoinflated with minimal left basilar atelectasis. IMPRESSION: Status post PICC line as described.  This could be withdrawn 2-3 cm. Electronically Signed   By: Inez Catalina M.D.   On: 02/24/2016 13:29    Medications / Allergies:  Scheduled Meds: . antiseptic oral rinse  7 mL Mouth Rinse QID  . chlorhexidine gluconate  15 mL Mouth Rinse BID  . furosemide  20 mg Intravenous Daily  . insulin aspart  0-9 Units Subcutaneous 4 times per day  . metoprolol  2.5 mg Intravenous 4 times per day  . metronidazole  500 mg Intravenous Q8H  . pantoprazole (PROTONIX) IV  80 mg Intravenous Q12H  . piperacillin-tazobactam (ZOSYN)  IV  3.375 g Intravenous 3 times per day  . sodium chloride flush  10-40 mL Intracatheter Q12H   Continuous Infusions: . Marland KitchenTPN (CLINIMIX-E) Adult 80 mL/hr at 02/25/16 1709   And  . fat emulsion 240 mL (02/25/16 1709)  . heparin 1,850 Units/hr (02/26/16 0640)   PRN Meds:.fentaNYL (SUBLIMAZE) injection, ipratropium-albuterol, [DISCONTINUED] ondansetron **OR** ondansetron (ZOFRAN) IV, promethazine, sodium chloride flush  Antibiotics: Anti-infectives    Start     Dose/Rate Route Frequency Ordered Stop   02/25/16 1400  piperacillin-tazobactam (ZOSYN) IVPB 3.375 g     3.375 g 12.5 mL/hr over 240 Minutes Intravenous 3 times per day 02/25/16 1231     02/25/16 1100  metroNIDAZOLE (FLAGYL) IVPB 500 mg     500 mg 100 mL/hr over 60 Minutes Intravenous Every 8 hours 02/25/16 1034     02/25/16 1100  ciprofloxacin (CIPRO) IVPB 400 mg  Status:  Discontinued     400 mg 200 mL/hr over 60 Minutes Intravenous Every 12  hours 02/25/16 1034 02/25/16 1210   02/23/16 0000  piperacillin-tazobactam (ZOSYN) IVPB 3.375 g  Status:  Discontinued    Comments:  Zosyn 3.375 g IV q8h for CrCl > 20  mL/min   3.375 g 12.5 mL/hr over 240 Minutes Intravenous 3 times per day 02/22/16 2351 02/25/16 1034   02/22/16 2245  metroNIDAZOLE (FLAGYL) IVPB 500 mg  Status:  Discontinued     500 mg 100 mL/hr over 60 Minutes Intravenous 3 times per day 02/22/16 2230 02/24/16 1030   02/22/16 2230  metroNIDAZOLE (FLAGYL) IVPB 500 mg  Status:  Discontinued     500 mg 100 mL/hr over 60 Minutes Intravenous Every 8 hours 02/22/16 2229 02/22/16 2230   02/22/16 1630  clindamycin (CLEOCIN) IVPB 600 mg    Comments:  Pharmacy may adjust dosing strength, interval, or rate of medication as needed for optimal therapy for the patient Send with patient on call to the OR.  Anesthesia to complete antibiotic administration <39mn [facility]   600 mg 100 mL/hr over 30 Minutes Intravenous On call to O.R. 02/22/16 1617 02/22/16 1915   02/19/16 0430  vancomycin (VANCOCIN) 50 mg/mL oral solution 125 mg  Status:  Discontinued     125 mg Oral 4 times daily 02/19/16 0417 02/25/16 1034        Assessment/Plan UGI bleeding & Perforated DU POD #3 s/p Ex Lap, graham patch, and drain placement - Dr. WDonne Hazel-NPO, NG tube, IVF, antiemetics, pain contro-Pepcid infusion plus Protonix. -UGI tomorrow to evaluate for a leak  -h pylori negative  -Graham patch closure is a little tenuous, large ulcer. -Mobilize, IS, up to chair, PT  -VAC changes MWF ID-continue zosyn D#3/7 for perf.  Flagyl for c diff.  Acute DVT-heparin gtt VDRF - resolved  AFIB, CHF, CAD, MI, ICM, HLD - per cards ABL anemia -stable PCM-TPN  EErby Pian ANP-BC [facility] Pager [phone](7A-4:30P) For consults and floor pages call [phone](7A-4:30P)  02/26/2016 7:45 AM

## 2016-02-26 NOTE — Progress Notes (Signed)
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 difficile colitis   Duodenal ulcer with perforation (HCC)   Chronic a-fib (HCC)   Chronic anticoagulation-Couamdin   CAD S/P LAD PCI- 2000   BPH- s/p TURP 01/25/16 - followed by 4 weeks of Cipro   GI bleed   Hypotension   S/P TURP   CKD (chronic kidney disease) stage 3, GFR 30-59 ml/min   Epigastric pain   Acute respiratory failure with hypoxia (HCC)   Perforated ulcer ([place])   Abdominal pain   Bleeding gastrointestinal   Admitted with Abd pain & fatigue - noted to be Anemic with supratherapeutic INR. Noted to have C-diff colitis, but on CT scan yesterday noted to have Duodenal Ulcer -- s/p Ex Lap & ulcer closure.   Subjective   Had a good day today starting to feel gastric rumbling No PND or orthopnea. No sensation of rapid irregular heartbeat despite his baseline heart rates in the low 100s to 90s.  Inpatient Medications    . antiseptic oral rinse  7 mL Mouth Rinse QID  . chlorhexidine gluconate  15 mL Mouth Rinse BID  . furosemide  20 mg Intravenous Daily  . insulin aspart  0-9 Units Subcutaneous 4 times per day  . ipratropium-albuterol  3 mL Nebulization Q6H  . metoprolol  5 mg Intravenous 4 times per day  . metronidazole  500 mg Intravenous Q8H  . pantoprazole (PROTONIX) IV  80 mg Intravenous Q12H  . piperacillin-tazobactam (ZOSYN)  IV  3.375 g Intravenous 3 times per day  . sodium chloride flush  10-40 mL Intracatheter Q12H    Vital Signs    Filed Vitals:   02/26/16 0744 02/26/16 1135 02/26/16 1700 02/26/16 1900  BP: 118/70 111/76 136/94 131/79  Pulse: 103 100 108 93  Temp: 98.2 F (36.8 C) 97.5 F (36.4 C) 98.6 F (37 C)     TempSrc: Oral Oral Oral   Resp:      Height:      Weight:      SpO2: 97% 93% 98% 98%    Intake/Output Summary (Last 24 hours) at 02/26/16 2114 Last data filed at 02/26/16 1900  Gross per 24 hour  Intake 3565.88 ml  Output 2652.5 ml  Net 913.38 ml   Filed Weights   02/22/16 0500 02/23/16 0422 02/24/16 0400  Weight: 311 lb 9.6 oz (141.341 kg) 218 lb 4.1 oz (99 kg) 216 lb 11.4 oz (98.3 kg)    Physical Exam    General: Pleasant,  less ill appearing - better skin color overall, but in good spirits. Neuro: Alert and oriented X 3. Moves all extremities spontaneously. Psych: Normal affect. HEENT:  Normal; NG tube in place  Neck: Supple without bruits or JVD. Lungs:  Resp regular and unlabored, coarse BS t/o, but no obvious rales. Heart: Irreg-Irreg ~ borderline tachycardic.  normal S1 and S2 no s3, s4, or murmurs. Abdomen: Soft, non-tender, non-distended, BS + x 4.  Extremities: No clubbing, cyanosis or edema. DP/PT/Radials 2+ and equal bilaterally.  Labs    CBC  Recent Labs  02/25/16 0206 02/26/16 0532  WBC 13.2* 11.3*  NEUTROABS 10.9*  --   HGB 9.3* 9.1*  HCT 27.2* 28.9*  MCV 81.4 81.9  PLT  [id number]   Basic Metabolic Panel  Recent Labs  02/25/16 0206 02/26/16 0532  NA 143 142  K 3.4* 3.5  CL 107 109  CO2 24 23  GLUCOSE 141* 130*  BUN 17 16  CREATININE 1.40* 1.07  CALCIUM 8.5* 8.2*  MG 2.1  --   PHOS 2.6 2.3*   Liver Function Tests  Recent Labs  02/24/16 0450 02/25/16 0206  AST 28 18  ALT 19 16*  ALKPHOS 37* 35*  BILITOT 0.8 0.6  PROT 5.1* 5.6*  ALBUMIN 2.0* 2.1*   No results for input(s): LIPASE, AMYLASE in the last 72 hours. Cardiac Enzymes No results for input(s): CKTOTAL, CKMB, CKMBINDEX, TROPONINI in the last 72 hours. BNP Invalid input(s): POCBNP D-Dimer No results for input(s): DDIMER in the last 72 hours. Hemoglobin A1C No results for input(s): HGBA1C in the last 72 hours. Fasting Lipid Panel  Recent Labs  02/24/16 0450  TRIG  78   Thyroid Function Tests No results for input(s): TSH, T4TOTAL, T3FREE, THYROIDAB in the last 72 hours.  Invalid input(s): FREET3  Telemetry    Afib with PVCs in couplets & triplets.  Intermittently paced rates mostly in 90s until agitated  ECG    n/a  Radiology    Reviewed/  Assessment & Plan    Symptomatic anemia /GI bleed- Hgb 7.9  -down to 7.1 now up to 9.3 following transfusion  Transfused  2Units PRBC on 3/8 given Cardiomyopathy  Follow hemoglobin count =- currently remaining stable @ 9.1    Cardiomyopathy, ischemic-EF [id number] Chronic systolic CHF (congestive heart failure) (HCC) - no active HF exacerbation Sx. CXR looks clear.   Avoid XS volume load. -- On standing diuretic dosing. ~20 mg IV daily while NPO.    Biventricular implantable cardioverter-defibrillator Medtronic   Supratherapeutic INR - reversed. --    Chronic a-fib ([place]) / Chronic anticoagulation-Couamdin held for surgery & GIB.    Rate still in 90-110 range - likely driven by discomfort & anemia.  Started Metoprolol to IV 2.5 mg q6hr for now. -->  increased to 5 mg every 6 for better control as BP has now started to respond.  IV Heparin started 2/2 Popliteal DVT - would continue until able to restart warfarin & complete bridge. (could use Lovenox if no active bleed on IV Heparin)  CAD S/P LAD PCI- 2000 - no antecedent angina.  Would expect a low Troponin elevation with existing CAD & Cardiomyopathy, but would simply continue home Rx as best possible.   Restart oral medications when able take by mouth. For now continue IV beta blocker. No active symptoms.  C. difficile colitis - per PCCM    Duodenal ulcer with perforation ([place]) - s/p repair yesterday.  Plan is TPN to start ~tomorrow -- would ensure that we use concentrated feeds to aviod volume overload.      Hypotension - stable; tolerating IV BB. -- Blood pressureimproved after transfusion. Tolerated increased beta blocker dose.    CKD  (chronic kidney disease) stage 3, GFR 30-59 ml/min: stable - CR improved following transfusion.   Signed, Leonie Man, M.D., M.S. Interventional Cardiologist   Pager # [phone] Phone # [phone] [address] [place], [place] [address]

## 2016-02-26 NOTE — Progress Notes (Signed)
ANTICOAGULATION CONSULT NOTE - Follow Up Consult  Pharmacy Consult for Heparin Indication: new bilateral DVT, hx of afib (Coumadin on hold)  Allergies  Allergen Reactions  . Keflex [Cephalexin] Shortness Of Breath and Other (See Comments)    dizziness    Patient Measurements: Height: 5\' 10"  (177.8 cm) Weight: 216 lb 11.4 oz (98.3 kg) IBW/kg (Calculated) : 73 Heparin Dosing Weight: 93 kg  Vital Signs: Temp: 97.5 F (36.4 C) (03/10 1135) Temp Source: Oral (03/10 1135) BP: 111/76 mmHg (03/10 1135) Pulse Rate: 100 (03/10 1135)  Labs:  Recent Labs  02/24/16 0450  02/24/16 0846  02/24/16 2000 02/25/16 0206  02/25/16 0745 02/26/16 0532 02/26/16 1826  HGB  --   < > 7.1*  --  9.3* 9.3*  --   --  9.1*  --   HCT  --   < > 21.2*  --  27.5* 27.2*  --   --  28.9*  --   PLT  --   --  231  --   --  243  --   --  280  --   LABPROT 19.7*  --   --   --   --   --   --  18.5* 16.8*  --   INR 1.67*  --   --   --   --   --   --  1.54* 1.35  --   HEPARINUNFRC  --   --   --   < >  --   --   < > 0.54 0.16* 0.32  CREATININE 1.51*  --   --   --   --  1.40*  --   --  1.07  --   < > = values in this interval not displayed.  Estimated Creatinine Clearance: 77.7 mL/min (by C-G formula based on Cr of 1.07).   Medications:  Infusions:  . Marland KitchenTPN (CLINIMIX-E) Adult 80 mL/hr at 02/26/16 1748   And  . fat emulsion 240 mL (02/26/16 1748)  . heparin 2,100 Units/hr (02/26/16 1840)    Assessment: 70 yo M with hx of afib on Coumadin PTA which was held on admit for GIB and surgery for perforated duodenal ulcer.  Now s/p OR.  New bilateral LE DVTs started on heparin infusion 3/8.  Heparin level is therapeutic on 2100 units/hr, but at low end of goal.   Goal of Therapy:  Heparin level 0.3-0.7 units/ml Monitor platelets by anticoagulation protocol: Yes   Plan:  Increase heparin to 2200 to keep at goal. Daily heparin level and CBC Follow-up plans for restarting Coumadin  Sloan Leiter, PharmD,  BCPS Clinical Pharmacist [phone]  02/26/2016 6:58 PM

## 2016-02-26 NOTE — Progress Notes (Signed)
[facility] TEAM 1 - Stepdown/ICU TEAM Progress Note  [name] [id number] DOB: 1946-04-10 DOA: 02/18/2016 PCP: [name], MD  Admit HPI / Brief Narrative: 70 y.o. WM PMHx Depression, CAD native artery, MI, Ischemic Cardiomyopathy with cardiac pacemaker, Persistent Atrial Fibrillation on Coumadin, HLD, Chronic Renal Insufficiency,  Patient presenting today for complaints of hypotension and dizziness. Patient states that he's had various somatic complaints ever since he had a TURP procedure performed on 01/25/2016. Of note he complains of intermittent abdominal cramping, bloating, constipation. Over the last 24 hours he is experienced extreme fatigue, dizziness upon standing and shortness of breath or dyspnea on exertion. Patient has been taking ciprofloxacin ever since his surgery. Patient also states that his blood pressure medications were changed 2 weeks ago due to hypotension. His dose of carvedilol was decreased and his losartan was stopped. Patient is also had some mild nausea without vomiting but denies chest pain, palpitations, neck stiffness, headache, LOC, head trauma. Patient has taken BP at home and states systolic as low as [id number].   HPI/Subjective: 3/10 A/O 4, abdomen appropriately tender for post surgery.   Assessment/Plan:  Acute respiratory failure. -Oxygen to keep SpO2 > 92% -DuoNeb QID  Hypotension 2nd to acute blood loss from GI bleed -Resolved.  Chronic systolic CHF s/p AICD  -LVEF 20 to 25% from Echo 01/06/16. -Metoprolol IV 5 mg QID  A Fib,/ CAD,  -Rate Improved but not well-controlled, may partially be secondary to pain. If still uncontrolled in a.m. will add Amiodarone or Digoxin -See systolic CHF  HLD. -Lipid panel pending, not on medication prior to admission  B/l lower extremity DVT  -Continue heparin drip -Prior to discharge will need to make decision on anticoagulant  CKD stage 3. -Resolved Cr WNL  Urinary retention S/p TURP  01/25/16.  Hypokalemia. -Resolved   GI bleed from perforated duodenal ulcer  -s/p Phillip Heal patch 3/06. -Protonix  gtt per surgery -Patient still NPO -TNA/nutrition, wound care per surgery  Acute blood loss Anemia/ Critical illness anemia  -Transfuse for Hb < 7 -SCD's  C diff colitis  - not certain if he is absorbing enteral vancomycin. -Peritonitis 2nd to perforated duodenal ulcer -Continue Flagyl  Hyperglycemia. -Sensitive SSI   Hypophosphatemia -K-Phos 20 mmol    Code Status: FULL Family Communication: no family present at time of exam Disposition Plan: Per surgery    Consultants: Dr.Haywood Dalbert Batman CCS Dr.David Loren R[facility] [place]  [facility] M    Procedure/Significant Events: 3/06 CT chest >> no PE 3/06 CT abd/pelvis >> pneumoperitoneum 3/06 Pneumoperitoneum on CT abd >> Surgery consulted; cardiology consulted 3/6 graham patch perforated duodenal ulcer,19 Fr blake drain near patch 3/07 Doppler legs b/l >> DVT Rt soleal vein, DVT Lt peroneal vein 3/08 transfuse PRBC   Culture 3/02 C diff PCR >> positive  Antibiotics: 3/02 Enteral vancomycin >> 3/09 3/06 Zosyn >> 3/09 3/09 Cipro 1 dose 3/06 Flagyl >>    DVT prophylaxis: Heparin drip    Devices    LINES / TUBES:  Abdominal wound VAC in place    Continuous Infusions: . Marland KitchenTPN (CLINIMIX-E) Adult 80 mL/hr at 02/26/16 1748   And  . fat emulsion 240 mL (02/26/16 1748)  . heparin 2,200 Units/hr (02/26/16 1947)    Objective: VITAL SIGNS: Temp: 98.6 F (37 C) (03/10 1700) Temp Source: Oral (03/10 1700) BP: 131/79 mmHg (03/10 1900) Pulse Rate: 93 (03/10 1900) SPO2; FIO2:   Intake/Output Summary (Last 24 hours) at 02/26/16 2030 Last data filed at 02/26/16  1900  Gross per 24 hour  Intake 3674.38 ml  Output 2652.5 ml  Net 1021.88 ml     Exam: General: A/O 4, abdomen appropriately tender for post surgery., No acute respiratory distress Eyes: Negative headache, negative scleral  hemorrhage ENT: Negative Runny nose, negative gingival bleeding, Neck:  Negative scars, masses, torticollis, lymphadenopathy, JVD Lungs: Clear to auscultation bilaterally without wheezes or crackles Cardiovascular: Regular irregular rhythm and rate ,without murmur gallop or rub normal S1 and S2 Abdomen: Positive abdominal pain, midline incision with wound VAC in place appropriately tender, nondistended, negative soft, bowel sounds, no rebound, no ascites, no appreciable mass Extremities: No significant cyanosis, clubbing, or edema bilateral lower extremities Psychiatric:  Negative depression, negative anxiety, negative fatigue, negative mania  Neurologic:  Cranial nerves II through XII intact, tongue/uvula midline, all extremities muscle strength 5/5, sensation intact throughout,  negative dysarthria, negative expressive aphasia, negative receptive aphasia.   Data Reviewed: Basic Metabolic Panel:  Recent Labs Lab 02/22/16 2300 02/23/16 0330 02/24/16 0450 02/25/16 0206 02/26/16 0532  NA 140 141 143 143 142  K 4.1 4.1 4.0 3.4* 3.5  CL 110 110 112* 107 109  CO2 20* 21* 21* 24 23  GLUCOSE 109* 114* 99 141* 130*  BUN 19 18 17 17 16   CREATININE 1.52* 1.47* 1.51* 1.40* 1.07  CALCIUM 8.5* 8.5* 8.4* 8.5* 8.2*  MG  --  1.8  --  2.1  --   PHOS  --  3.8  --  2.6 2.3*   Liver Function Tests:  Recent Labs Lab 02/21/16 0705 02/22/16 0932 02/23/16 0330 02/24/16 0450 02/25/16 0206  AST 16 17 45* 28 18  ALT 10* 11* 24 19 16*  ALKPHOS 24* 30* 27* 37* 35*  BILITOT 0.7 0.6 0.5 0.8 0.6  PROT 5.3* 5.3* 5.5* 5.1* 5.6*  ALBUMIN 2.7* 2.7* 2.3* 2.0* 2.1*    Recent Labs Lab 02/22/16 0932  LIPASE 87*   No results for input(s): AMMONIA in the last 168 hours. CBC:  Recent Labs Lab 02/23/16 0012 02/23/16 0808 02/24/16 0846 02/24/16 2000 02/25/16 0206 02/26/16 0532  WBC 10.8* 10.3 12.5*  --  13.2* 11.3*  NEUTROABS  --   --   --   --  10.9*  --   HGB 7.4* 7.9* 7.1* 9.3* 9.3* 9.1*    HCT 22.4* 23.9* 21.2* 27.5* 27.2* 28.9*  MCV 81.2 81.0 81.2  --  81.4 81.9  PLT 203 196 231  --  243 280   Cardiac Enzymes:  Recent Labs Lab 02/22/16 0932 02/22/16 1338 02/22/16 2300  TROPONINI <0.03 <0.03 0.03   BNP (last 3 results)  Recent Labs  02/18/16 0940  BNP 156.7*    ProBNP (last 3 results) No results for input(s): PROBNP in the last 8760 hours.  CBG:  Recent Labs Lab 02/25/16 1837 02/25/16 2355 02/26/16 0553 02/26/16 1247 02/26/16 1807  GLUCAP 155* 144* 128* 131* 129*    Recent Results (from the past 240 hour(s))  Urine culture     Status: None   Collection Time: 02/18/16  9:35 AM  Result Value Ref Range Status   Specimen Description URINE, CLEAN CATCH  Final   Special Requests NONE  Final   Culture NO GROWTH 1 DAY  Final   Report Status 02/19/2016 FINAL  Final  MRSA PCR Screening     Status: None   Collection Time: 02/18/16  8:41 PM  Result Value Ref Range Status   MRSA by PCR NEGATIVE NEGATIVE Final    Comment:  The GeneXpert MRSA Assay (FDA approved for NASAL specimens only), is one component of a comprehensive MRSA colonization surveillance program. It is not intended to diagnose MRSA infection nor to guide or monitor treatment for MRSA infections.   C difficile quick scan w PCR reflex     Status: Abnormal   Collection Time: 02/18/16 11:00 PM  Result Value Ref Range Status   C Diff antigen POSITIVE (A) NEGATIVE Final   C Diff toxin POSITIVE (A) NEGATIVE Final   C Diff interpretation Positive for toxigenic C. difficile  Final    Comment: CRITICAL RESULT CALLED TO, READ BACK BY AND VERIFIED WITH: AMY KORSLIEN,RN @0108  02/19/16 MKELLY   Surgical pcr screen     Status: None   Collection Time: 02/22/16  4:27 PM  Result Value Ref Range Status   MRSA, PCR NEGATIVE NEGATIVE Final   Staphylococcus aureus NEGATIVE NEGATIVE Final    Comment:        The Xpert SA Assay (FDA approved for NASAL specimens in patients over 21 years of  age), is one component of a comprehensive surveillance program.  Test performance has been validated by [facility] for patients greater than or equal to 55 year old. It is not intended to diagnose infection nor to guide or monitor treatment.      Studies:  Recent x-ray studies have been reviewed in detail by the Attending Physician  Scheduled Meds:  Scheduled Meds: . antiseptic oral rinse  7 mL Mouth Rinse QID  . chlorhexidine gluconate  15 mL Mouth Rinse BID  . furosemide  20 mg Intravenous Daily  . insulin aspart  0-9 Units Subcutaneous 4 times per day  . ipratropium-albuterol  3 mL Nebulization Q6H  . metoprolol  5 mg Intravenous 4 times per day  . metronidazole  500 mg Intravenous Q8H  . pantoprazole (PROTONIX) IV  80 mg Intravenous Q12H  . piperacillin-tazobactam (ZOSYN)  IV  3.375 g Intravenous 3 times per day  . sodium chloride flush  10-40 mL Intracatheter Q12H    Time spent on care of this patient: 40 mins   WOODS, Geraldo Docker , MD  [facility] Office  [phone] Pager - [phone]  On-Call/Text Page:      [email or web address]      password [email or web address]  If 7PM-7AM, please contact night-coverage [email or web address] Password [email or web address] 02/26/2016, 8:30 PM   LOS: 8 days   Care during the described time interval was provided by me .  I have reviewed this patient's available data, including medical history, events of note, physical examination, and all test results as part of my evaluation. I have personally reviewed and interpreted all radiology studies.   Dia Crawford, MD [phone] Pager

## 2016-02-27 ENCOUNTER — Inpatient Hospital Stay (HOSPITAL_COMMUNITY): Payer: Medicare Other

## 2016-02-27 DIAGNOSIS — R339 Retention of urine, unspecified: Secondary | ICD-10-CM

## 2016-02-27 DIAGNOSIS — E876 Hypokalemia: Secondary | ICD-10-CM

## 2016-02-27 DIAGNOSIS — R739 Hyperglycemia, unspecified: Secondary | ICD-10-CM

## 2016-02-27 LAB — CBC WITH DIFFERENTIAL/PLATELET
Basophils Absolute: 0 10*3/uL (ref 0.0–0.1)
Basophils Relative: 0 %
EOS PCT: 4 %
Eosinophils Absolute: 0.5 10*3/uL (ref 0.0–0.7)
HCT: 29.2 % — ABNORMAL LOW (ref 39.0–52.0)
Hemoglobin: 9.3 g/dL — ABNORMAL LOW (ref 13.0–17.0)
Lymphocytes Relative: 18 %
Lymphs Abs: 2.2 10*3/uL (ref 0.7–4.0)
MCH: 26 pg (ref 26.0–34.0)
MCHC: 31.8 g/dL (ref 30.0–36.0)
MCV: 81.6 fL (ref 78.0–100.0)
MONO ABS: 1.3 10*3/uL — AB (ref 0.1–1.0)
MONOS PCT: 10 %
Neutro Abs: 8.2 10*3/uL — ABNORMAL HIGH (ref 1.7–7.7)
Neutrophils Relative %: 68 %
PLATELETS: 314 10*3/uL (ref 150–400)
RBC: 3.58 MIL/uL — ABNORMAL LOW (ref 4.22–5.81)
RDW: 15.8 % — AB (ref 11.5–15.5)
WBC: 12.2 10*3/uL — ABNORMAL HIGH (ref 4.0–10.5)

## 2016-02-27 LAB — COMPREHENSIVE METABOLIC PANEL
ALT: 11 U/L — ABNORMAL LOW (ref 17–63)
ANION GAP: 10 (ref 5–15)
AST: 16 U/L (ref 15–41)
Albumin: 2.1 g/dL — ABNORMAL LOW (ref 3.5–5.0)
Alkaline Phosphatase: 32 U/L — ABNORMAL LOW (ref 38–126)
BUN: 17 mg/dL (ref 6–20)
CHLORIDE: 105 mmol/L (ref 101–111)
CO2: 26 mmol/L (ref 22–32)
Calcium: 8.5 mg/dL — ABNORMAL LOW (ref 8.9–10.3)
Creatinine, Ser: 0.94 mg/dL (ref 0.61–1.24)
Glucose, Bld: 119 mg/dL — ABNORMAL HIGH (ref 65–99)
POTASSIUM: 3.4 mmol/L — AB (ref 3.5–5.1)
Sodium: 141 mmol/L (ref 135–145)
Total Bilirubin: 0.4 mg/dL (ref 0.3–1.2)
Total Protein: 5.3 g/dL — ABNORMAL LOW (ref 6.5–8.1)

## 2016-02-27 LAB — HEPARIN LEVEL (UNFRACTIONATED)
Heparin Unfractionated: 0.57 IU/mL (ref 0.30–0.70)
Heparin Unfractionated: 0.2 IU/mL — ABNORMAL LOW (ref 0.30–0.70)
Heparin Unfractionated: 0.6 IU/mL (ref 0.30–0.70)

## 2016-02-27 LAB — PROTIME-INR
INR: 1.35 (ref 0.00–1.49)
PROTHROMBIN TIME: 16.8 s — AB (ref 11.6–15.2)

## 2016-02-27 LAB — LIPID PANEL
CHOL/HDL RATIO: 3.5 ratio
CHOLESTEROL: 112 mg/dL (ref 0–200)
HDL: 32 mg/dL — ABNORMAL LOW (ref >40)
LDL Cholesterol: 66 mg/dL (ref 0–99)
TRIGLYCERIDES: 68 mg/dL (ref <150)
VLDL: 14 mg/dL (ref 0–40)

## 2016-02-27 LAB — GLUCOSE, CAPILLARY
GLUCOSE-CAPILLARY: 125 mg/dL — AB (ref 65–99)
GLUCOSE-CAPILLARY: 131 mg/dL — AB (ref 65–99)
GLUCOSE-CAPILLARY: 137 mg/dL — AB (ref 65–99)
Glucose-Capillary: 99 mg/dL (ref 65–99)

## 2016-02-27 LAB — PHOSPHORUS: Phosphorus: 2.7 mg/dL (ref 2.5–4.6)

## 2016-02-27 MED ORDER — TRACE MINERALS CR-CU-MN-SE-ZN 10-1000-500-60 MCG/ML IV SOLN
INTRAVENOUS | Status: AC
  Administered 2016-02-27: 17:00:00 via INTRAVENOUS
  Filled 2016-02-27: qty 1920

## 2016-02-27 MED ORDER — INSULIN ASPART 100 UNIT/ML ~~LOC~~ SOLN
0.0000 [IU] | Freq: Three times a day (TID) | SUBCUTANEOUS | Status: DC
  Administered 2016-02-27: 1 [IU] via SUBCUTANEOUS

## 2016-02-27 MED ORDER — FAT EMULSION 20 % IV EMUL
240.0000 mL | INTRAVENOUS | Status: AC
Start: 2016-02-27 — End: 2016-02-28
  Administered 2016-02-27: 240 mL via INTRAVENOUS
  Filled 2016-02-27: qty 250

## 2016-02-27 MED ORDER — POTASSIUM CHLORIDE 10 MEQ/50ML IV SOLN
10.0000 meq | INTRAVENOUS | Status: AC
Start: 2016-02-27 — End: 2016-02-27
  Administered 2016-02-27 (×4): 10 meq via INTRAVENOUS
  Filled 2016-02-27 (×4): qty 50

## 2016-02-27 MED ORDER — METOPROLOL TARTRATE 1 MG/ML IV SOLN
5.0000 mg | INTRAVENOUS | Status: DC
  Administered 2016-02-27 – 2016-03-01 (×18): 5 mg via INTRAVENOUS
  Filled 2016-02-27 (×18): qty 5

## 2016-02-27 NOTE — Progress Notes (Signed)
SUBJECTIVE:  No complaints  OBJECTIVE:   Vitals:   Filed Vitals:   02/27/16 0445 02/27/16 0733 02/27/16 0740 02/27/16 1139  BP: 129/81 127/75 127/75 125/68  Pulse: 94 120 100 118  Temp: 98.3 F (36.8 C)  98 F (36.7 C) 97.3 F (36.3 C)  TempSrc: Oral  Oral Oral  Resp: 19 18 18 18   Height:      Weight:      SpO2: 94% 100% 95% 97%   I&O's:   Intake/Output Summary (Last 24 hours) at 02/27/16 1243 Last data filed at 02/27/16 1214  Gross per 24 hour  Intake   3573 ml  Output   2840 ml  Net    733 ml   TELEMETRY: Reviewed telemetry pt in atrial fibrillation with HR 90-120bpm     PHYSICAL EXAM General: Well developed, well nourished, in no acute distress Head: Eyes PERRLA, No xanthomas.   Normal cephalic and atramatic  Lungs:   Clear bilaterally to auscultation and percussion. Heart:   irregulalry irreguar  S1 S2 Pulses are 2+ & equal. Abdomen: Bowel sounds are positive, abdomen soft and non-tender without masses Extremities:   No clubbing, cyanosis or edema.  DP +1 Neuro: Alert and oriented X 3. Psych:  Good affect, responds appropriately   LABS: Basic Metabolic Panel:  Recent Labs  02/25/16 0206 02/26/16 0532 02/27/16 0500  NA 143 142 141  K 3.4* 3.5 3.4*  CL 107 109 105  CO2 24 23 26   GLUCOSE 141* 130* 119*  BUN 17 16 17   CREATININE 1.40* 1.07 0.94  CALCIUM 8.5* 8.2* 8.5*  MG 2.1  --   --   PHOS 2.6 2.3* 2.7   Liver Function Tests:  Recent Labs  02/25/16 0206 02/27/16 0500  AST 18 16  ALT 16* 11*  ALKPHOS 35* 32*  BILITOT 0.6 0.4  PROT 5.6* 5.3*  ALBUMIN 2.1* 2.1*   No results for input(s): LIPASE, AMYLASE in the last 72 hours. CBC:  Recent Labs  02/25/16 0206 02/26/16 0532 02/27/16 0500  WBC 13.2* 11.3* 12.2*  NEUTROABS 10.9*  --  8.2*  HGB 9.3* 9.1* 9.3*  HCT 27.2* 28.9* 29.2*  MCV 81.4 81.9 81.6  PLT 243 280 314   Cardiac Enzymes: No results for input(s): CKTOTAL, CKMB, CKMBINDEX, TROPONINI in the last 72  hours. BNP: Invalid input(s): POCBNP D-Dimer: No results for input(s): DDIMER in the last 72 hours. Hemoglobin A1C: No results for input(s): HGBA1C in the last 72 hours. Fasting Lipid Panel:  Recent Labs  02/27/16 0500  CHOL 112  HDL 32*  LDLCALC 66  TRIG 68  CHOLHDL 3.5   Thyroid Function Tests: No results for input(s): TSH, T4TOTAL, T3FREE, THYROIDAB in the last 72 hours.  Invalid input(s): FREET3 Anemia Panel: No results for input(s): VITAMINB12, FOLATE, FERRITIN, TIBC, IRON, RETICCTPCT in the last 72 hours. Coag Panel:   Lab Results  Component Value Date   INR 1.35 02/27/2016   INR 1.35 02/26/2016   INR 1.54* 02/25/2016    RADIOLOGY: Dg Chest 2 View  02/18/2016  CLINICAL DATA:  Dizziness. EXAM: CHEST  2 VIEW COMPARISON:  January 31, 2016. FINDINGS: The heart size and mediastinal contours are within normal limits. Right-sided pacemaker is unchanged in position. No pneumothorax or pleural effusion is noted. Both lungs are clear. The visualized skeletal structures are unremarkable. IMPRESSION: No active cardiopulmonary disease. Electronically Signed   By: Marijo Conception, M.D.   On: 02/18/2016 10:05   Dg Chest 2 View  01/31/2016  CLINICAL DATA:  71 year old male with shortness of breath EXAM: CHEST  2 VIEW COMPARISON:  Radiograph dated 01/19/2016 FINDINGS: Two views of the chest do not demonstrate a focal consolidation. There is no pleural effusion or pneumothorax. The cardiac silhouette is within normal limits. Right pectoral AICD device. The osseous structures appear unremarkable. IMPRESSION: No active cardiopulmonary disease. Electronically Signed   By: Anner Crete M.D.   On: 01/31/2016 21:16   Dg Abd 1 View  02/24/2016  CLINICAL DATA:  New NG tube position. EXAM: ABDOMEN - 1 VIEW COMPARISON:  02/18/2016 FINDINGS: Enteric tube tip is in the left upper quadrant consistent with location in the upper stomach. Proximal side hole is projected just below the expected location  of the EG junction. Left pleural effusion. IMPRESSION: Enteric tube tip is position in the left upper quadrant consistent with location in the upper stomach. Electronically Signed   By: Lucienne Capers M.D.   On: 02/24/2016 06:01   Dg Abd 1 View  02/18/2016  CLINICAL DATA:  Abdominal cramping with nausea for 3 weeks. EXAM: ABDOMEN - 1 VIEW COMPARISON:  Abdomen plain film dated 01/31/2016. FINDINGS: Fairly large amount of bowel gas is seen throughout the small and large bowel. No dilated bowel loops seen. No evidence of soft tissue mass or abnormal fluid collection. No evidence of free intraperitoneal air. Degenerative changes again noted within the slightly scoliotic lumbar spine. No acute - appearing osseous abnormality. IMPRESSION: 1. Fairly large amount of bowel gas throughout the abdomen, involving both the small and large bowel, which can be an indication of underlying gastroenteritis and/or ileus. 2. No dilated bowel loops seen.  No evidence of bowel obstruction. Electronically Signed   By: Franki Cabot M.D.   On: 02/18/2016 14:35   Ct Angio Chest Pe W/cm &/or Wo Cm  02/22/2016  CLINICAL DATA:  Shortness of breath and chest pain. History of coronary artery disease, myocardial infarction and congestive heart failure. EXAM: CT ANGIOGRAPHY CHEST CT ABDOMEN AND PELVIS WITH CONTRAST TECHNIQUE: Multidetector CT imaging of the chest was performed using the standard protocol during bolus administration of intravenous contrast. Multiplanar CT image reconstructions and MIPs were obtained to evaluate the vascular anatomy. Multidetector CT imaging of the abdomen and pelvis was performed using the standard protocol during bolus administration of intravenous contrast. CONTRAST:  17mL OMNIPAQUE IOHEXOL 350 MG/ML SOLN COMPARISON:  Chest radiographs 02/22/2016 and 02/18/2016. Nuclear medicine pulmonary ventilation and perfusion study 01/31/2016. FINDINGS: CTA CHEST FINDINGS Mediastinum: The pulmonary arteries are well  opacified with contrast. There is no evidence of acute pulmonary embolism. There is atherosclerosis of the aorta, great vessels and coronary arteries.The heart is enlarged. There is a right subclavian pacemaker with leads in the right atrium, right ventricle and coronary sinus. There is no significant pericardial effusion. There are no enlarged mediastinal, hilar or axillary lymph nodes. The thyroid gland, trachea and esophagus demonstrate no significant findings. Lungs/Pleura: There is trace pleural fluid bilaterally. No pneumothorax or pneumomediastinum.Dependent pulmonary opacities bilaterally are most consistent with atelectasis. There is no consolidation or endobronchial lesion. Musculoskeletal/Chest wall: No chest wall lesion or acute osseous findings. CT ABDOMEN and PELVIS FINDINGS Hepatobiliary: The liver is normal in density without focal abnormality. There is a small gallstone. There is no significant gallbladder wall thickening or biliary dilatation. There is no pneumobilia. Pancreas: Unremarkable. No pancreatic ductal dilatation or surrounding inflammatory changes. Spleen: Normal in size without focal abnormality. Adrenals/Urinary Tract: Both adrenal glands appear normal. There are multiple exophytic low-density renal  lesions bilaterally which are likely all cysts. Some of these are too small to optimally characterize. No evidence of enhancing renal mass, urinary tract calculus or hydronephrosis. There is bladder wall thickening with a recent trans urethral resection defect in the prostate gland. Stomach/Bowel: The distal stomach and proximal duodenum demonstrate irregular wall thickening and surrounding inflammation. There is an extraluminal fluid collection extending posteriorly from the duodenal bulb with surrounding extraluminal fluid and air, highly suspicious for perforation of an ulcer. There is a small amount of fluid surrounding the liver and spleen as well as a moderate amount of free  intraperitoneal air. Extraluminal air extends into the porta hepatis. No other bowel wall thickening or significant distention. The appendix appears normal. Vascular/Lymphatic: There are no enlarged abdominal or pelvic lymph nodes. There is diffuse atherosclerosis of the aorta, its branches and the iliac arteries. Reproductive: As above, there is a recent TUR defect within the prostate gland which is heterogeneous in density. Other: As above, pneumoperitoneum with free fluid in the upper abdomen. No generalized ascites or focal extraluminal fluid collection identified. Musculoskeletal: No acute or significant osseous findings. Review of the MIP images confirms the above findings. IMPRESSION: 1. Pneumoperitoneum with extraluminal air and fluid surrounding the distal stomach and proximal duodenum highly suspicious for a perforated peptic ulcer. General surgical evaluation recommended. 2. No evidence of bowel obstruction or abscess. 3. No evidence of acute pulmonary embolism. 4. Small bilateral pleural effusions with mild dependent atelectasis at both lung bases. 5. Moderate diffuse atherosclerosis. 6. Cholelithiasis, bilateral renal cysts and recent postsurgical changes in the prostate gland. 7. Critical Value/emergent results were called by telephone at the time of interpretation on 02/22/2016 at 3:29 pm to Dr. Reyne Dumas , who verbally acknowledged these results. Electronically Signed   By: Richardean Sale M.D.   On: 02/22/2016 15:34   Ct Abdomen Pelvis W Contrast  02/22/2016  CLINICAL DATA:  Shortness of breath and chest pain. History of coronary artery disease, myocardial infarction and congestive heart failure. EXAM: CT ANGIOGRAPHY CHEST CT ABDOMEN AND PELVIS WITH CONTRAST TECHNIQUE: Multidetector CT imaging of the chest was performed using the standard protocol during bolus administration of intravenous contrast. Multiplanar CT image reconstructions and MIPs were obtained to evaluate the vascular anatomy.  Multidetector CT imaging of the abdomen and pelvis was performed using the standard protocol during bolus administration of intravenous contrast. CONTRAST:  11mL OMNIPAQUE IOHEXOL 350 MG/ML SOLN COMPARISON:  Chest radiographs 02/22/2016 and 02/18/2016. Nuclear medicine pulmonary ventilation and perfusion study 01/31/2016. FINDINGS: CTA CHEST FINDINGS Mediastinum: The pulmonary arteries are well opacified with contrast. There is no evidence of acute pulmonary embolism. There is atherosclerosis of the aorta, great vessels and coronary arteries.The heart is enlarged. There is a right subclavian pacemaker with leads in the right atrium, right ventricle and coronary sinus. There is no significant pericardial effusion. There are no enlarged mediastinal, hilar or axillary lymph nodes. The thyroid gland, trachea and esophagus demonstrate no significant findings. Lungs/Pleura: There is trace pleural fluid bilaterally. No pneumothorax or pneumomediastinum.Dependent pulmonary opacities bilaterally are most consistent with atelectasis. There is no consolidation or endobronchial lesion. Musculoskeletal/Chest wall: No chest wall lesion or acute osseous findings. CT ABDOMEN and PELVIS FINDINGS Hepatobiliary: The liver is normal in density without focal abnormality. There is a small gallstone. There is no significant gallbladder wall thickening or biliary dilatation. There is no pneumobilia. Pancreas: Unremarkable. No pancreatic ductal dilatation or surrounding inflammatory changes. Spleen: Normal in size without focal abnormality. Adrenals/Urinary Tract: Both adrenal glands  appear normal. There are multiple exophytic low-density renal lesions bilaterally which are likely all cysts. Some of these are too small to optimally characterize. No evidence of enhancing renal mass, urinary tract calculus or hydronephrosis. There is bladder wall thickening with a recent trans urethral resection defect in the prostate gland. Stomach/Bowel:  The distal stomach and proximal duodenum demonstrate irregular wall thickening and surrounding inflammation. There is an extraluminal fluid collection extending posteriorly from the duodenal bulb with surrounding extraluminal fluid and air, highly suspicious for perforation of an ulcer. There is a small amount of fluid surrounding the liver and spleen as well as a moderate amount of free intraperitoneal air. Extraluminal air extends into the porta hepatis. No other bowel wall thickening or significant distention. The appendix appears normal. Vascular/Lymphatic: There are no enlarged abdominal or pelvic lymph nodes. There is diffuse atherosclerosis of the aorta, its branches and the iliac arteries. Reproductive: As above, there is a recent TUR defect within the prostate gland which is heterogeneous in density. Other: As above, pneumoperitoneum with free fluid in the upper abdomen. No generalized ascites or focal extraluminal fluid collection identified. Musculoskeletal: No acute or significant osseous findings. Review of the MIP images confirms the above findings. IMPRESSION: 1. Pneumoperitoneum with extraluminal air and fluid surrounding the distal stomach and proximal duodenum highly suspicious for a perforated peptic ulcer. General surgical evaluation recommended. 2. No evidence of bowel obstruction or abscess. 3. No evidence of acute pulmonary embolism. 4. Small bilateral pleural effusions with mild dependent atelectasis at both lung bases. 5. Moderate diffuse atherosclerosis. 6. Cholelithiasis, bilateral renal cysts and recent postsurgical changes in the prostate gland. 7. Critical Value/emergent results were called by telephone at the time of interpretation on 02/22/2016 at 3:29 pm to Dr. Reyne Dumas , who verbally acknowledged these results. Electronically Signed   By: Richardean Sale M.D.   On: 02/22/2016 15:34   Nm Pulmonary Perf And Vent  02/01/2016  CLINICAL DATA:  Shortness of breath and left-sided  chest pain. EXAM: NUCLEAR MEDICINE VENTILATION - PERFUSION LUNG SCAN TECHNIQUE: Ventilation images were obtained in multiple projections using inhaled aerosol Tc-67m DTPA. Perfusion images were obtained in multiple projections after intravenous injection of Tc-35m MAA. RADIOPHARMACEUTICALS:  [id number] millicuries [id number] DTPA aerosol inhalation and [id number] millicuries [id number] MAA IV COMPARISON:  Chest radiograph 01/31/2016 FINDINGS: Ventilation: Focal defect in the right upper lung corresponding to cardiac pacemaker. Otherwise, no significant ventilation defect is identified. Perfusion: No wedge shaped peripheral perfusion defects to suggest acute pulmonary embolism. IMPRESSION: Very low probability of pulmonary embolus. Electronically Signed   By: Lucienne Capers M.D.   On: 02/01/2016 01:18   Dg Chest Port 1 View  02/24/2016  CLINICAL DATA:  Confirmed tip of the left-sided PICC line. EXAM: PORTABLE CHEST 1 VIEW COMPARISON:  Chest x-ray of earlier today. FINDINGS: The PICC line tip appears to have been withdrawn partially sensitive tip overlies the distal third of the SVC. The right internal jugular venous catheter tip projects over the midportion of the SVC. The other support tubes and lines are in reasonable position. IMPRESSION: The PICC line tip has been withdrawn approximately 2 cm and appears to overlie the distal third of the SVC. Electronically Signed   By: David  Martinique M.D.   On: 02/24/2016 15:44   Dg Chest Port 1 View  02/24/2016  CLINICAL DATA:  Status post PICC line placement EXAM: PORTABLE CHEST 1 VIEW COMPARISON:  02/24/2016 FINDINGS: Nasogastric catheter, defibrillator and right jugular central line are again seen and stable.  A new left-sided PICC line is noted with the catheter tip in the mid right atrium. This could be withdrawn 2-3 cm. Cardiac shadow is stable. The lungs are hypoinflated with minimal left basilar atelectasis. IMPRESSION: Status post PICC line as described.  This could  be withdrawn 2-3 cm. Electronically Signed   By: Inez Catalina M.D.   On: 02/24/2016 13:29   Dg Chest Port 1 View  02/24/2016  CLINICAL DATA:  Acute respiratory failure with hypoxia, CHF, coronary artery disease, former smoker. EXAM: PORTABLE CHEST 1 VIEW COMPARISON:  Portable chest x-ray of February 22, 2016 FINDINGS: The endotracheal tube is not clearly evident and may have been removed. The lungs are mildly hypoinflated. Persistent increased density at both lung bases is demonstrated. There is further obscuration of the left hemidiaphragm. The cardiac silhouette is enlarged. The central pulmonary vascularity is prominent. The right internal jugular venous catheter tip projects over the midportion of the SVC. The esophagogastric tubes proximal port lies at or just below the GE junction. The pacemaker-defibrillator electrodes are in stable position. IMPRESSION: Probable extubation of the trachea. Persistent mild hypoinflation with worsening bibasilar atelectasis or pneumonia on the left. A small amount of pleural fluid is likely present at both lung bases. Advancement of the nasogastric tube by 10 cm is recommended to assure that the proximal port remains below the GE junction. Electronically Signed   By: David  Martinique M.D.   On: 02/24/2016 07:31   Portable Chest Xray  02/22/2016  CLINICAL DATA:  Acute respiratory failure with hypoxia EXAM: PORTABLE CHEST 1 VIEW COMPARISON:  02/22/2016 FINDINGS: Endotracheal tube 4.3 cm above the carina. Internal jugular central line on the right with tip at the cavoatrial junction. Two lead cardiac pacer noted. NG tube appears to cross the gastroesophageal junction. Mild cardiac enlargement stable. Venous congestion without edema. Mild hypoventilatory change at both lung bases. IMPRESSION: Mild bibasilar opacity most consistent with atelectasis. Electronically Signed   By: Skipper Cliche M.D.   On: 02/22/2016 21:07   Dg Chest Port 1 View  02/22/2016  CLINICAL DATA:  Onset of  chest pain and shortness of breath now. EXAM: PORTABLE CHEST 1 VIEW COMPARISON:  PA and lateral chest x-ray of February 18, 2016 FINDINGS: The lungs are mildly hypoinflated. The interstitial markings are increased bilaterally. The cardiac silhouette is enlarged and the pulmonary vascularity is engorged. The permanent pacemaker defibrillator is in stable position. The mediastinum is normal in width. There is no pleural effusion or pneumothorax. IMPRESSION: CHF with mild pulmonary interstitial edema. There is no alveolar edema nor pneumonia. Electronically Signed   By: David  Martinique M.D.   On: 02/22/2016 08:24   Dg Abd 2 Views  01/31/2016  CLINICAL DATA:  70 year old male with abdominal pain and bloating constipation. EXAM: ABDOMEN - 2 VIEW COMPARISON:  Chest radiograph dated 01/31/2016 FINDINGS: Constipation. There is no evidence of bowel obstruction or free air. No radiopaque calculi noted. There is degenerative changes of the spine. No acute fracture. AICD leads partially visualized. IMPRESSION: Constipation.  No bowel obstruction or free air. Electronically Signed   By: Anner Crete M.D.   On: 01/31/2016 23:20     Assessment & Plan   1.  Symptomatic anemia /GI bleed- Hgb 7.9 -down to 7.1 now up to 9.3 following transfusion  Transfused 2Units PRBC on 3/8 given Cardiomyopathy  Follow hemoglobin count =- currently remaining stable @ 9.3  2.  Cardiomyopathy, ischemic-EF [id number] Chronic systolic CHF (congestive heart failure) (HCC) - no active HF exacerbation  Sx. CXR looks clear. Avoid XS volume load. -- On standing diuretic dosing. ~20 mg IV daily while NPO.  Biventricular implantable cardioverter-defibrillator [employer].  Supratherapeutic INR - reversed.   3.  Chronic a-fib (HCC) / Chronic anticoagulation-Couamdin held for surgery & GIB.   Rate still in 90-120 range - likely driven by discomfort & anemia.  Increase  Metoprolol to IV 5 mg q4hr for better HR control.  BP should  tolerate.  IV Heparin started 2/2 Popliteal DVT - would continue until able to restart warfarin & complete bridge. (could use Lovenox if no active bleed on IV Heparin)    4.  CAD S/P LAD PCI- 2000 - no antecedent angina. Would expect a low Troponin elevation with existing CAD & Cardiomyopathy, but would simply continue home Rx as best possible.   Restart oral medications when able take by mouth. For now continue IV beta blocker. No active symptoms.   5.  C. difficile colitis - per PCCM   6.  Duodenal ulcer with perforation ([place]) - s/p repair yesterday. Plan is TPN to start ~tomorrow -- would ensure that we use concentrated feeds to aviod volume overload.   7.  Hypotension - stable; tolerating IV BB. -- Blood pressure improved after transfusion. Tolerated increased beta blocker dose.  8.  CKD (chronic kidney disease) stage 3, GFR 30-59 ml/min: stable - CR improved following transfusion.        Sueanne Margarita, MD  02/27/2016  12:43 PM

## 2016-02-27 NOTE — Progress Notes (Signed)
Pharmacy: Re- heparin  70 yo M with hx of afib on warfarin PTA which was held on admit for GIB and surgery for perforated duodenal ulcer. Now s/p OR. New bilateral LE DVTs started on heparin infusion on 3/8.  Goal of Therapy:  Heparin level 0.3-0.7 units/ml Monitor platelets by anticoagulation protocol: Yes  Plan: - repeat heparin level now back sub-therapeutic at 0.20.  However, RN stated that his heparin drip was off when patient came back from water soluble upper GI series in IR.   Patient was in IR for about 1.5 hrs. RN was uncertain of how long drip was off, but restarted heparin back at previous rate of 2200 units/hr when patient got back to the unit. - Will continue heparin drip at 2300 units/hr - recheck 6 hour heparin level at [place], PharmD, BCPS 02/27/2016 4:57 PM

## 2016-02-27 NOTE — Progress Notes (Signed)
PARENTERAL NUTRITION CONSULT NOTE - FOLLOW UP  Pharmacy Consult for TPN Indication: Prolonged ileus  Allergies  Allergen Reactions  . Keflex [Cephalexin] Shortness Of Breath and Other (See Comments)    dizziness    Patient Measurements: Height: 5' 10" (177.8 cm) Weight: 216 lb 11.4 oz (98.3 kg) IBW/kg (Calculated) : 73 Adjusted Body Weight:  Usual Weight:   Vital Signs: Temp: 98 F (36.7 C) (03/11 0740) Temp Source: Oral (03/11 0740) BP: 127/75 mmHg (03/11 0740) Pulse Rate: 100 (03/11 0740) Intake/Output from previous day: 03/10 0701 - 03/11 0700 In: 3705.4 [I.V.:438.7; IV Piggyback:1106.7; TPN:2160] Out: 2167.5 [Urine:2050; Emesis/NG output:110; Drains:7.5] Intake/Output from this shift: Total I/O In: 626 [I.V.:176; TPN:450] Out: 1150 [Urine:1150]  Labs:  Recent Labs  02/25/16 0206 02/25/16 0745 02/26/16 0532 02/27/16 0500  WBC 13.2*  --  11.3* 12.2*  HGB 9.3*  --  9.1* 9.3*  HCT 27.2*  --  28.9* 29.2*  PLT 243  --  280 314  INR  --  1.54* 1.35 1.35     Recent Labs  02/25/16 0206 02/26/16 0532 02/27/16 0500  NA 143 142 141  K 3.4* 3.5 3.4*  CL 107 109 105  CO2 _0 GLUCOSE 141* 130* 119*  BUN _1 CREATININE 1.40* 1.07 0.94  CALCIUM 8.5* 8.2* 8.5*  MG 2.1  --   --   PHOS 2.6 2.3* 2.7  PROT 5.6*  --  5.3*  ALBUMIN 2.1*  --  2.1*  AST 18  --  16  ALT 16*  --  11*  ALKPHOS 35*  --  32*  BILITOT 0.6  --  0.4  PREALBUMIN 8.3*  --   --   TRIG  --   --  68  CHOLHDL  --   --  3.5  CHOL  --   --  112   Estimated Creatinine Clearance: 88.4 mL/min (by C-G formula based on Cr of 0.94).    Recent Labs  02/26/16 1807 02/26/16 2353 02/27/16 0522  GLUCAP 129* 131* 137*    Medications:  Scheduled:  . chlorhexidine gluconate  15 mL Mouth Rinse BID  . furosemide  20 mg Intravenous Daily  . insulin aspart  0-9 Units Subcutaneous 4 times per day  . metoprolol  5 mg Intravenous 4 times per day  . metronidazole  500 mg Intravenous Q8H   . pantoprazole (PROTONIX) IV  80 mg Intravenous Q12H  . piperacillin-tazobactam (ZOSYN)  IV  3.375 g Intravenous 3 times per day  . sodium chloride flush  10-40 mL Intracatheter Q12H    Insulin Requirements in the past 24 hours:  4 unit sens SSI yesterday  Assessment: 28 YOM presented on 02/18/16 with hypotension and dizziness from GIB. Found to have perforated duodenum and was taken to the OR on 02/22/16 for ex-lap with Phillip Heal patch of duodenal ulcer. Pharmacy consulted to initiate TPN for nutritional support given expected prolonged bowel rest.  GI: admitted with GIB on high dose PPI IV BID, baseline prealbumin low at 8.3.Abd softer with neg pressure dressing, min output from JP drain. Plan to go slow with diet. Emesis/NG O/P 170m, drains < 173mEndo: TSH WNL. No hx DM - CBGs controlled with minimal SSI needs  Lytes: K+ 3.4 (~[facility], goal >/= 4 for ileus), Phos 2.7 (decreased, s/p K-Phos 2026m on 3/10), o/w wnl. Renal: CKD3, s/p recent TURP (2/6) - SCr improved to 1.07, BUN WNL. UOP 0.p ml/kg/hr, NS at  10 ml/hr. I/O 1.5L (3/11: net positive 4.5L since admit) Pulm: intubated for procedure, extubated 3/7 to RA, required [place]-2L briefly on 3/9, but now on RA again - [place] Cards: CAD / ICM / AFib / DL / AICD / CHF - BP soft, tachy (in Afib, & anemic). No active CHF & CXR clear - Lopressor IV, Lasix 20mg IV daily AC: Coumadin PTA for hx AFib >> held d/t GIB. New DVT on 3/7 >> heparin gtt started 3/8 - anemic but stable (anemia panel WNL), plts WNL, INR 1.67  Hepatobil: mild elevated alk phos, others WNL. TG WNL. Neuro: depression - A&O, pain score 0-7, PRN Fentanyl available Heme: Anemia 2nd GIB, PRBC x 2 on 3/9 with Hg improved to 9.3, stable today. ID: Flagyl IV for C.diff colitis + Zosyn for intra-abd infxn, long course of Cipro PTA - afebrile, WBC 12.2  Best Practices: heparin gtt TPN Access: right IJ placed 02/22/16, PICC placed 02/24/16 TPN start date:  02/24/16  Current Nutrition:  TPN  Nutritional Goals:  2000-2200 kCal, 120-130 grams of protein per day  Plan:  - Cont Clinimix E 5/15 to 80 ml/hr (goal 100 ml/hr) and continue ILE 20% at 10 ml/hr. Will not be able to concentrate TPN given limitation of premixed Clinimix. Watch volume, respiratory status - Daily multivitamin, folate and trace elements in TPN - Change SSI to q8 - K runs x 4 - BMet, Mg in AM - TPN labs qMon/Thur   , PharmD Clinical Pharmacist [facility]     

## 2016-02-27 NOTE — Progress Notes (Signed)
ANTICOAGULATION CONSULT NOTE - Follow Up Consult  Pharmacy Consult for heparin Indication: atrial fibrillation   Labs:  Recent Labs  02/25/16 0206  02/25/16 0745 02/26/16 0532  02/27/16 0500 02/27/16 1554 02/27/16 2248  HGB 9.3*  --   --  9.1*  --  9.3*  --   --   HCT 27.2*  --   --  28.9*  --  29.2*  --   --   PLT 243  --   --  280  --  314  --   --   LABPROT  --   --  18.5* 16.8*  --  16.8*  --   --   INR  --   --  1.54* 1.35  --  1.35  --   --   HEPARINUNFRC  --   < > 0.54 0.16*  < > 0.57 0.20* 0.60  CREATININE 1.40*  --   --  1.07  --  0.94  --   --   < > = values in this interval not displayed.    Assessment/Plan:  70yo male remains therapeutic on heparin. Will continue gtt at current rate and confirm stable with am labs.   Wynona Neat, PharmD, BCPS  02/27/2016,11:14 PM

## 2016-02-27 NOTE — Progress Notes (Signed)
[facility] Ramnath Progress Note 5 Days Post-Op  Subjective: Patient is great spirits.  No acute distress  Objective: Vital signs in last 24 hours: Temp:  [97.5 F (36.4 C)-98.6 F (37 C)] 98 F (36.7 C) (03/11 0740) Pulse Rate:  [93-120] 100 (03/11 0740) Resp:  [18-19] 18 (03/11 0740) BP: (111-136)/(75-94) 127/75 mmHg (03/11 0740) SpO2:  [93 %-100 %] 95 % (03/11 0740) Last BM Date: 02/21/16  Intake/Output from previous day: 03/10 0701 - 03/11 0700 In: 3705.4 [I.V.:438.7; IV Piggyback:1106.7; TPN:2160] Out: 2167.5 [Urine:2050; Emesis/NG output:110; Drains:7.5] Intake/Output this shift: Total I/O In: -  Out: 400 [Urine:400]  General: No acute distress  Lungs: Clear to auscultation  Abd: Soft, hypoactive bowel sounds.  Minimally tender.  Drain output is minimal, but mostly clotted blood.  Extremities: No change  Neuro: Intact  Lab Results:  @LABLAST2 (wbc:2,hgb:2,hct:2,plt:2) BMET ) Recent Labs  02/26/16 0532 02/27/16 0500  NA 142 141  K 3.5 3.4*  CL 109 105  CO2 23 26  GLUCOSE 130* 119*  BUN 16 17  CREATININE 1.07 0.94  CALCIUM 8.2* 8.5*   PT/INR  Recent Labs  02/26/16 0532 02/27/16 0500  LABPROT 16.8* 16.8*  INR 1.35 1.35   ABG No results for input(s): PHART, HCO3 in the last 72 hours.  Invalid input(s): PCO2, PO2  Studies/Results: No results found.  Anti-infectives: Anti-infectives    Start     Dose/Rate Route Frequency Ordered Stop   02/25/16 1400  piperacillin-tazobactam (ZOSYN) IVPB 3.375 g     3.375 g 12.5 mL/hr over 240 Minutes Intravenous 3 times per day 02/25/16 1231     02/25/16 1100  metroNIDAZOLE (FLAGYL) IVPB 500 mg     500 mg 100 mL/hr over 60 Minutes Intravenous Every 8 hours 02/25/16 1034     02/25/16 1100  ciprofloxacin (CIPRO) IVPB 400 mg  Status:  Discontinued     400 mg 200 mL/hr over 60 Minutes Intravenous Every 12 hours 02/25/16 1034 02/25/16 1210   02/23/16 0000  piperacillin-tazobactam (ZOSYN) IVPB 3.375 g  Status:   Discontinued    Comments:  Zosyn 3.375 g IV q8h for CrCl > 20 mL/min   3.375 g 12.5 mL/hr over 240 Minutes Intravenous 3 times per day 02/22/16 2351 02/25/16 1034   02/22/16 2245  metroNIDAZOLE (FLAGYL) IVPB 500 mg  Status:  Discontinued     500 mg 100 mL/hr over 60 Minutes Intravenous 3 times per day 02/22/16 2230 02/24/16 1030   02/22/16 2230  metroNIDAZOLE (FLAGYL) IVPB 500 mg  Status:  Discontinued     500 mg 100 mL/hr over 60 Minutes Intravenous Every 8 hours 02/22/16 2229 02/22/16 2230   02/22/16 1630  clindamycin (CLEOCIN) IVPB 600 mg    Comments:  Pharmacy may adjust dosing strength, interval, or rate of medication as needed for optimal therapy for the patient Send with patient on call to the OR.  Anesthesia to complete antibiotic administration <53min prior to incision per [facility].   600 mg 100 mL/hr over 30 Minutes Intravenous On call to O.R. 02/22/16 1617 02/22/16 1915   02/19/16 0430  vancomycin (VANCOCIN) 50 mg/mL oral solution 125 mg  Status:  Discontinued     125 mg Oral 4 times daily 02/19/16 0417 02/25/16 1034      Assessment/Plan: s/p Procedure(s): EXPLORATORY LAPAROTOMY WITH  PATCH OF DUODENAL ULCWater soluble contrast study today.  LOS: 9 days   Kathryne Eriksson. Dahlia Bailiff, MD, FACS [phone] [facility] 02/27/2016

## 2016-02-27 NOTE — Progress Notes (Signed)
ANTICOAGULATION CONSULT NOTE - Follow Up Consult  Pharmacy Consult for Heparin Indication: new bilateral DVT, hx of afib (Coumadin on hold)  Allergies  Allergen Reactions  . Keflex [Cephalexin] Shortness Of Breath and Other (See Comments)    dizziness    Patient Measurements: Height: 5\' 10"  (177.8 cm) Weight: 216 lb 11.4 oz (98.3 kg) IBW/kg (Calculated) : 73 Heparin Dosing Weight: 93 kg  Vital Signs: Temp: 98 F (36.7 C) (03/11 0740) Temp Source: Oral (03/11 0740) BP: 127/75 mmHg (03/11 0740) Pulse Rate: 100 (03/11 0740)  Labs:  Recent Labs  02/25/16 0206  02/25/16 0745 02/26/16 0532 02/26/16 1826 02/27/16 0500  HGB 9.3*  --   --  9.1*  --  9.3*  HCT 27.2*  --   --  28.9*  --  29.2*  PLT 243  --   --  280  --  314  LABPROT  --   --  18.5* 16.8*  --  16.8*  INR  --   --  1.54* 1.35  --  1.35  HEPARINUNFRC  --   < > 0.54 0.16* 0.32 0.57  CREATININE 1.40*  --   --  1.07  --  0.94  < > = values in this interval not displayed.  Estimated Creatinine Clearance: 88.4 mL/min (by C-G formula based on Cr of 0.94).   Medications:  Infusions:  . Marland KitchenTPN (CLINIMIX-E) Adult 80 mL/hr at 02/27/16 0600   And  . fat emulsion 240 mL (02/27/16 0600)  . heparin 2,200 Units/hr (02/27/16 0757)    Assessment: 70 yo M with hx of afib on warfarin PTA which was held on admit for GIB and surgery for perforated duodenal ulcer.  Now s/p OR.  New bilateral LE DVTs started on heparin infusion 3/8.  Heparin level 0.57, therapeutic on 2200 units/hr. Hgb & plt stable, no noted bleeding.  Goal of Therapy:  Heparin level 0.3-0.7 units/ml Monitor platelets by anticoagulation protocol: Yes   Plan:  Continue heparin 2200 units/h 1400 confirmatory HL Daily heparin level and CBC Follow-up plans for restarting Coumadin Monitor s/sx bleeding   Heloise Ochoa, Pharm.D., BCPS PGY2 Cardiology Pharmacy Resident Pager: [phone] 02/27/2016 9:15 AM

## 2016-02-27 NOTE — Progress Notes (Signed)
[facility] TEAM 1 - Stepdown/ICU TEAM Progress Note  [name] [id number] DOB: May 23, 1946 DOA: 02/18/2016 PCP: [name], MD  Admit HPI / Brief Narrative: 70 y.o. WM PMHx Depression, CAD native artery, MI, Ischemic Cardiomyopathy with cardiac pacemaker, Persistent Atrial Fibrillation on Coumadin, HLD, Chronic Renal Insufficiency,  Patient presenting today for complaints of hypotension and dizziness. Patient states that he's had various somatic complaints ever since he had a TURP procedure performed on 01/25/2016. Of note he complains of intermittent abdominal cramping, bloating, constipation. Over the last 24 hours he is experienced extreme fatigue, dizziness upon standing and shortness of breath or dyspnea on exertion. Patient has been taking ciprofloxacin ever since his surgery. Patient also states that his blood pressure medications were changed 2 weeks ago due to hypotension. His dose of carvedilol was decreased and his losartan was stopped. Patient is also had some mild nausea without vomiting but denies chest pain, palpitations, neck stiffness, headache, LOC, head trauma. Patient has taken BP at home and states systolic as low as [id number].   HPI/Subjective: 3/11 A/O 4, abdomen appropriately tender for post surgery. Positive flatulence, patient beginning to hunger   Assessment/Plan:  Acute respiratory failure with hypoxia. -Oxygen to keep SpO2 > 92% -DuoNeb QID  Hypotension 2nd to acute blood loss from GI bleed -Resolved.  Chronic systolic CHF s/p AICD  -LVEF 20 to 25% from Echo 01/06/16. -Metoprolol IV 5 mg QID  A Fib,/ CAD,  -Rate Improved but not well-controlled, may partially be secondary to pain. If still uncontrolled in a.m. will add Amiodarone or Digoxin -See systolic CHF  HLD. -Lipid panel pending, not on medication prior to admission  B/l lower extremity DVT  -Continue heparin drip -Prior to discharge will need to make decision on anticoagulant  CKD  stage 3. -Resolved Cr WNL  Urinary retention S/p TURP 01/25/16.   Acute GI bleed from perforated duodenal ulcer  -s/p Phillip Heal patch 3/06. -Protonix  gtt per surgery -Patient still NPO -TNA/nutrition, wound care per surgery  Acute blood loss Anemia/ Critical illness anemia  -Transfuse for Hb < 7 -SCD's  C diff colitis  - not certain if he is absorbing enteral vancomycin. -Peritonitis 2nd to perforated duodenal ulcer -Continue Flagyl  Hyperglycemia. -Sensitive SSI   Hypophosphatemia -WNL  Hypokalemia. -Potassium goal > 4  -Potassium IV 40 mEq      Code Status: FULL Family Communication: Wife present at time of exam Disposition Plan: Per surgery    Consultants: Dr.Haywood Ingram [facility] Dr.David Loren Racer [facility]  [facility] M    Procedure/Significant Events: 3/06 CT chest >> no PE 3/06 CT abd/pelvis >> pneumoperitoneum 3/06 Pneumoperitoneum on CT abd >> Surgery consulted; cardiology consulted 3/6 graham patch perforated duodenal ulcer,19 Fr blake drain near patch 3/07 Doppler legs b/l >> DVT Rt soleal vein, DVT Lt peroneal vein 3/08 transfuse PRBC 3/11 UGI water-soluble contrast;Irregular outpouching of contrast along the duodenal bulb in the region of the patient's known duodenal ulcer. No spill into the peritoneal cavity.  Culture 3/02 C diff PCR >> positive  Antibiotics: 3/02 Enteral vancomycin >> 3/09 3/06 Zosyn >> 3/09 3/09 Cipro 1 dose 3/06 Flagyl >>    DVT prophylaxis: Heparin drip    Devices    LINES / TUBES:  Abdominal wound VAC in place    Continuous Infusions: . Marland KitchenTPN (CLINIMIX-E) Adult 80 mL/hr at 02/27/16 1214   And  . fat emulsion 240 mL (02/27/16 1214)  . Marland KitchenTPN (CLINIMIX-E) Adult     And  .  fat emulsion    . heparin 2,200 Units/hr (02/27/16 1214)    Objective: VITAL SIGNS: Temp: 97.3 F (36.3 C) (03/11 1139) Temp Source: Oral (03/11 1139) BP: 125/68 mmHg (03/11 1139) Pulse Rate: 118 (03/11  1139) SPO2; FIO2:   Intake/Output Summary (Last 24 hours) at 02/27/16 1632 Last data filed at 02/27/16 1214  Gross per 24 hour  Intake 2624.13 ml  Output   2490 ml  Net 134.13 ml     Exam: General: A/O 4, abdomen appropriately tender for post surgery., No acute respiratory distress Eyes: Negative headache, negative scleral hemorrhage ENT: Negative Runny nose, negative gingival bleeding, Neck:  Negative scars, masses, torticollis, lymphadenopathy, JVD Lungs: Clear to auscultation bilaterally without wheezes or crackles Cardiovascular: Regular irregular rhythm and rate ,without murmur gallop or rub normal S1 and S2 Abdomen: Positive abdominal pain, midline incision with wound VAC in place appropriately tender, nondistended, positive soft, bowel sounds, no rebound, no ascites, no appreciable mass Extremities: No significant cyanosis, clubbing, or edema bilateral lower extremities Psychiatric:  Negative depression, negative anxiety, negative fatigue, negative mania  Neurologic:  Cranial nerves II through XII intact, tongue/uvula midline, all extremities muscle strength 5/5, sensation intact throughout,  negative dysarthria, negative expressive aphasia, negative receptive aphasia.   Data Reviewed: Basic Metabolic Panel:  Recent Labs Lab 02/23/16 0330 02/24/16 0450 02/25/16 0206 02/26/16 0532 02/27/16 0500  NA 141 143 143 142 141  K 4.1 4.0 3.4* 3.5 3.4*  CL 110 112* 107 109 105  CO2 21* 21* 24 23 26   GLUCOSE 114* 99 141* 130* 119*  BUN 18 17 17 16 17   CREATININE 1.47* 1.51* 1.40* 1.07 0.94  CALCIUM 8.5* 8.4* 8.5* 8.2* 8.5*  MG 1.8  --  2.1  --   --   PHOS 3.8  --  2.6 2.3* 2.7   Liver Function Tests:  Recent Labs Lab 02/22/16 0932 02/23/16 0330 02/24/16 0450 02/25/16 0206 02/27/16 0500  AST 17 45* 28 18 16   ALT 11* 24 19 16* 11*  ALKPHOS 30* 27* 37* 35* 32*  BILITOT 0.6 0.5 0.8 0.6 0.4  PROT 5.3* 5.5* 5.1* 5.6* 5.3*  ALBUMIN 2.7* 2.3* 2.0* 2.1* 2.1*     Recent Labs Lab 02/22/16 0932  LIPASE 87*   No results for input(s): AMMONIA in the last 168 hours. CBC:  Recent Labs Lab 02/23/16 0808 02/24/16 0846 02/24/16 2000 02/25/16 0206 02/26/16 0532 02/27/16 0500  WBC 10.3 12.5*  --  13.2* 11.3* 12.2*  NEUTROABS  --   --   --  10.9*  --  8.2*  HGB 7.9* 7.1* 9.3* 9.3* 9.1* 9.3*  HCT 23.9* 21.2* 27.5* 27.2* 28.9* 29.2*  MCV 81.0 81.2  --  81.4 81.9 81.6  PLT 196 231  --  243 280 314   Cardiac Enzymes:  Recent Labs Lab 02/22/16 0932 02/22/16 1338 02/22/16 2300  TROPONINI <0.03 <0.03 0.03   BNP (last 3 results)  Recent Labs  02/18/16 0940  BNP 156.7*    ProBNP (last 3 results) No results for input(s): PROBNP in the last 8760 hours.  CBG:  Recent Labs Lab 02/26/16 0553 02/26/16 1247 02/26/16 1807 02/26/16 2353 02/27/16 0522  GLUCAP 128* 131* 129* 131* 137*    Recent Results (from the past 240 hour(s))  Urine culture     Status: None   Collection Time: 02/18/16  9:35 AM  Result Value Ref Range Status   Specimen Description URINE, CLEAN CATCH  Final   Special Requests NONE  Final  Culture NO GROWTH 1 DAY  Final   Report Status 02/19/2016 FINAL  Final  MRSA PCR Screening     Status: None   Collection Time: 02/18/16  8:41 PM  Result Value Ref Range Status   MRSA by PCR NEGATIVE NEGATIVE Final    Comment:        The GeneXpert MRSA Assay (FDA approved for NASAL specimens only), is one component of a comprehensive MRSA colonization surveillance program. It is not intended to diagnose MRSA infection nor to guide or monitor treatment for MRSA infections.   C difficile quick scan w PCR reflex     Status: Abnormal   Collection Time: 02/18/16 11:00 PM  Result Value Ref Range Status   C Diff antigen POSITIVE (A) NEGATIVE Final   C Diff toxin POSITIVE (A) NEGATIVE Final   C Diff interpretation Positive for toxigenic C. difficile  Final    Comment: CRITICAL RESULT CALLED TO, READ BACK BY AND VERIFIED  WITH: AMY KORSLIEN,RN @0108  02/19/16 MKELLY   Surgical pcr screen     Status: None   Collection Time: 02/22/16  4:27 PM  Result Value Ref Range Status   MRSA, PCR NEGATIVE NEGATIVE Final   Staphylococcus aureus NEGATIVE NEGATIVE Final    Comment:        The Xpert SA Assay (FDA approved for NASAL specimens in patients over 95 years of age), is one component of a comprehensive surveillance program.  Test performance has been validated by [facility] for patients greater than or equal to 15 year old. It is not intended to diagnose infection nor to guide or monitor treatment.      Studies:  Recent x-ray studies have been reviewed in detail by the Attending Physician  Scheduled Meds:  Scheduled Meds: . chlorhexidine gluconate  15 mL Mouth Rinse BID  . furosemide  20 mg Intravenous Daily  . insulin aspart  0-9 Units Subcutaneous 3 times per day  . metoprolol  5 mg Intravenous Q4H  . metronidazole  500 mg Intravenous Q8H  . pantoprazole (PROTONIX) IV  80 mg Intravenous Q12H  . piperacillin-tazobactam (ZOSYN)  IV  3.375 g Intravenous 3 times per day  . potassium chloride  10 mEq Intravenous Q1 Hr x 4  . sodium chloride flush  10-40 mL Intracatheter Q12H    Time spent on care of this patient: 40 mins   Halei Hanover, Geraldo Docker , MD  [facility] Office  [phone] Pager - [phone]  On-Call/Text Page:      [email or web address]      password [email or web address]  If 7PM-7AM, please contact night-coverage [email or web address] Password [email or web address] 02/27/2016, 4:32 PM   LOS: 9 days   Care during the described time interval was provided by me .  I have reviewed this patient's available data, including medical history, events of note, physical examination, and all test results as part of my evaluation. I have personally reviewed and interpreted all radiology studies.   Dia Crawford, MD [phone] Pager

## 2016-02-28 DIAGNOSIS — K922 Gastrointestinal hemorrhage, unspecified: Secondary | ICD-10-CM | POA: Diagnosis present

## 2016-02-28 DIAGNOSIS — N183 Chronic kidney disease, stage 3 unspecified: Secondary | ICD-10-CM | POA: Diagnosis present

## 2016-02-28 DIAGNOSIS — K265 Chronic or unspecified duodenal ulcer with perforation: Secondary | ICD-10-CM | POA: Diagnosis present

## 2016-02-28 DIAGNOSIS — Z95810 Presence of automatic (implantable) cardiac defibrillator: Secondary | ICD-10-CM | POA: Diagnosis present

## 2016-02-28 DIAGNOSIS — E876 Hypokalemia: Secondary | ICD-10-CM | POA: Diagnosis present

## 2016-02-28 DIAGNOSIS — E785 Hyperlipidemia, unspecified: Secondary | ICD-10-CM

## 2016-02-28 DIAGNOSIS — I4891 Unspecified atrial fibrillation: Secondary | ICD-10-CM | POA: Diagnosis present

## 2016-02-28 DIAGNOSIS — R739 Hyperglycemia, unspecified: Secondary | ICD-10-CM | POA: Diagnosis present

## 2016-02-28 DIAGNOSIS — R339 Retention of urine, unspecified: Secondary | ICD-10-CM | POA: Diagnosis present

## 2016-02-28 LAB — CBC WITH DIFFERENTIAL/PLATELET
Basophils Absolute: 0 10*3/uL (ref 0.0–0.1)
Basophils Relative: 0 %
EOS PCT: 4 %
Eosinophils Absolute: 0.5 10*3/uL (ref 0.0–0.7)
HCT: 26.8 % — ABNORMAL LOW (ref 39.0–52.0)
Hemoglobin: 8.5 g/dL — ABNORMAL LOW (ref 13.0–17.0)
LYMPHS ABS: 2.5 10*3/uL (ref 0.7–4.0)
LYMPHS PCT: 20 %
MCH: 25.7 pg — AB (ref 26.0–34.0)
MCHC: 31.7 g/dL (ref 30.0–36.0)
MCV: 81 fL (ref 78.0–100.0)
MONO ABS: 1.2 10*3/uL — AB (ref 0.1–1.0)
Monocytes Relative: 10 %
Neutro Abs: 8.2 10*3/uL — ABNORMAL HIGH (ref 1.7–7.7)
Neutrophils Relative %: 66 %
PLATELETS: 337 10*3/uL (ref 150–400)
RBC: 3.31 MIL/uL — ABNORMAL LOW (ref 4.22–5.81)
RDW: 16.4 % — AB (ref 11.5–15.5)
WBC: 12.4 10*3/uL — ABNORMAL HIGH (ref 4.0–10.5)

## 2016-02-28 LAB — COMPREHENSIVE METABOLIC PANEL
ALT: 12 U/L — AB (ref 17–63)
ANION GAP: 6 (ref 5–15)
AST: 20 U/L (ref 15–41)
Albumin: 1.9 g/dL — ABNORMAL LOW (ref 3.5–5.0)
Alkaline Phosphatase: 30 U/L — ABNORMAL LOW (ref 38–126)
BUN: 20 mg/dL (ref 6–20)
CHLORIDE: 107 mmol/L (ref 101–111)
CO2: 26 mmol/L (ref 22–32)
Calcium: 8.3 mg/dL — ABNORMAL LOW (ref 8.9–10.3)
Creatinine, Ser: 1.04 mg/dL (ref 0.61–1.24)
GLUCOSE: 125 mg/dL — AB (ref 65–99)
POTASSIUM: 3.3 mmol/L — AB (ref 3.5–5.1)
Sodium: 139 mmol/L (ref 135–145)
TOTAL PROTEIN: 5.2 g/dL — AB (ref 6.5–8.1)
Total Bilirubin: 0.5 mg/dL (ref 0.3–1.2)

## 2016-02-28 LAB — HEPARIN LEVEL (UNFRACTIONATED): Heparin Unfractionated: 0.63 IU/mL (ref 0.30–0.70)

## 2016-02-28 LAB — GLUCOSE, CAPILLARY
GLUCOSE-CAPILLARY: 127 mg/dL — AB (ref 65–99)
GLUCOSE-CAPILLARY: 149 mg/dL — AB (ref 65–99)
Glucose-Capillary: 131 mg/dL — ABNORMAL HIGH (ref 65–99)
Glucose-Capillary: 103 mg/dL — ABNORMAL HIGH (ref 65–99)

## 2016-02-28 LAB — PROTIME-INR
INR: 1.46 (ref 0.00–1.49)
Prothrombin Time: 17.8 seconds — ABNORMAL HIGH (ref 11.6–15.2)

## 2016-02-28 LAB — PHOSPHORUS: Phosphorus: 5.7 mg/dL — ABNORMAL HIGH (ref 2.5–4.6)

## 2016-02-28 LAB — MAGNESIUM: Magnesium: 1.8 mg/dL (ref 1.7–2.4)

## 2016-02-28 MED ORDER — DIGOXIN 0.25 MG/ML IJ SOLN
0.1250 mg | Freq: Every day | INTRAMUSCULAR | Status: DC
Start: 2016-02-28 — End: 2016-03-02
  Administered 2016-02-29 – 2016-03-02 (×3): 0.125 mg via INTRAVENOUS
  Filled 2016-02-28: qty 0.5
  Filled 2016-02-28 (×2): qty 2
  Filled 2016-02-28: qty 1

## 2016-02-28 MED ORDER — POTASSIUM CHLORIDE 10 MEQ/50ML IV SOLN
10.0000 meq | INTRAVENOUS | Status: AC
  Administered 2016-02-28 (×5): 10 meq via INTRAVENOUS
  Filled 2016-02-28 (×5): qty 50

## 2016-02-28 MED ORDER — TRACE MINERALS CR-CU-MN-SE-ZN 10-1000-500-60 MCG/ML IV SOLN
INTRAVENOUS | Status: AC
  Administered 2016-02-28: 17:00:00 via INTRAVENOUS
  Filled 2016-02-28: qty 1920

## 2016-02-28 MED ORDER — MAGNESIUM SULFATE 2 GM/50ML IV SOLN
2.0000 g | Freq: Once | INTRAVENOUS | Status: AC
  Administered 2016-02-28: 2 g via INTRAVENOUS
  Filled 2016-02-28: qty 50

## 2016-02-28 MED ORDER — FAT EMULSION 20 % IV EMUL
240.0000 mL | INTRAVENOUS | Status: AC
  Administered 2016-02-28: 240 mL via INTRAVENOUS
  Filled 2016-02-28: qty 250

## 2016-02-28 NOTE — Clinical Social Work Note (Signed)
Clinical Social Work Assessment  Patient Details  Name: [name] MRN: [id number] Date of Birth: August 21, 1946  Date of referral:  02/28/16               Reason for consult:  Facility Placement                Permission sought to share information with:  Case Manager, Family Supports Permission granted to share information::  Yes, Verbal Permission Granted  Name::     Hattan Pichon  Agency::     Relationship::  spouse  Contact Information:  [phone]  Housing/Transportation Living arrangements for the past 2 months:  Single Family Home Source of Information:  Patient Patient Interpreter Needed:  None Criminal Activity/Legal Involvement Pertinent to Current Situation/Hospitalization:  No - Comment as needed Significant Relationships:  Spouse Lives with:  Spouse Do you feel safe going back to the place where you live?  Yes Need for family participation in patient care:  Yes (Comment)  Care giving concerns:  None reported at this time.    Social Worker assessment / plan:  CSW received consult by Therapist, sports. CSW went to speak with patient regarding the possible need for short term rehab. CSW introduced self and acknowledged the patient. Patient is alert and orientedx4. Patient was calm and cooperative with CSW assessment. No family at bedside. CSW informed patient of PT recommendation for short term rehab. Patient is agreeable to SNF placement. CSW provided patient with a list of SNF facilities in [facility], and was provided permission to fax clinical information out to the facilities. No preferences provided at this time. Patient informed CSW that he would like to discuss this option with wife before making a decision regarding facility choice. CSW to complete FL2 for MD signature. CSW to initiate SNF placement process.    Employment status:  Disabled (Comment on whether or not currently receiving Disability) Insurance information:  Managed Medicare PT Recommendations:  [place] / Referral to community resources:  [place]  Patient/Family's Response to care:  Patient is agreeable to SNF Placement once medically stable and ready for discharge. Patient would like to discuss this with his wife before picking a facility.   Patient/Family's Understanding of and Emotional Response to Diagnosis, Current Treatment, and Prognosis: Patient was very cooperative with CSW assessment. Spouse is very involved in patient's care and is aware of current treatment and prognosis. Patient is agreeable to disposition plan. Patient voiced that he would have liked to go home, however believes short term rehab will be very beneficial for him at this time. Patient is hopeful for progress and return back home. Patient was very appreciative of CSW intervention.    Emotional Assessment Appearance:  Appears stated age Attitude/Demeanor/Rapport:   (Calm and Cooperative) Affect (typically observed):  Accepting, Appropriate, Calm Orientation:  Oriented to Self, Oriented to Place, Oriented to Situation, Oriented to  Time Alcohol / Substance use:  Not Applicable Psych involvement (Current and /or in the community):  No (Comment)  Discharge Needs  Concerns to be addressed:  Discharge Planning Concerns Readmission within the last 30 days:  No Current discharge risk:  Physical Impairment Barriers to Discharge:  Continued Medical Work up   [employer], LCSW 02/28/2016, 4:14 PM

## 2016-02-28 NOTE — Progress Notes (Signed)
SUBJECTIVE:  No complaints  OBJECTIVE:   Vitals:   Filed Vitals:   02/27/16 1933 02/27/16 2347 02/28/16 0349 02/28/16 0730  BP: 102/43 123/59 132/81 101/57  Pulse: 110 99 103 94  Temp: 97.9 F (36.6 C) 98.2 F (36.8 C) 98.3 F (36.8 C) 98.2 F (36.8 C)  TempSrc: Oral Oral Oral Oral  Resp: 15 24 18 21   Height:      Weight:      SpO2: 98% 97% 98% 100%   I&O's:   Intake/Output Summary (Last 24 hours) at 02/28/16 1023 Last data filed at 02/28/16 0730  Gross per 24 hour  Intake 3592.67 ml  Output 1817.5 ml  Net 1775.17 ml   TELEMETRY: Reviewed telemetry pt in atrial fibrillation      PHYSICAL EXAM General: Well developed, well nourished, in no acute distress Head: Eyes PERRLA, No xanthomas.   Normal cephalic and atramatic  Lungs:   Clear bilaterally to auscultation anteriorly Heart:   Irregularly irregular S1 S2 Pulses are 2+ & equal. Abdomen: Bowel sounds are positive, abdomen soft and non-tender without masses  Extremities:   No clubbing, cyanosis or edema.  DP +1 Neuro: Alert and oriented X 3. Psych:  Good affect, responds appropriately   LABS: Basic Metabolic Panel:  Recent Labs  02/26/16 0532 02/27/16 0500 02/28/16 0504  NA 142 141 139  K 3.5 3.4* 3.3*  CL 109 105 107  CO2 23 26 26   GLUCOSE 130* 119* 125*  BUN 16 17 20   CREATININE 1.07 0.94 1.04  CALCIUM 8.2* 8.5* 8.3*  MG  --   --  1.8  PHOS 2.3* 2.7  --    Liver Function Tests:  Recent Labs  02/27/16 0500 02/28/16 0504  AST 16 20  ALT 11* 12*  ALKPHOS 32* 30*  BILITOT 0.4 0.5  PROT 5.3* 5.2*  ALBUMIN 2.1* 1.9*   No results for input(s): LIPASE, AMYLASE in the last 72 hours. CBC:  Recent Labs  02/27/16 0500 02/28/16 0504  WBC 12.2* 12.4*  NEUTROABS 8.2* 8.2*  HGB 9.3* 8.5*  HCT 29.2* 26.8*  MCV 81.6 81.0  PLT 314 337   Cardiac Enzymes: No results for input(s): CKTOTAL, CKMB, CKMBINDEX, TROPONINI in the last 72 hours. BNP: Invalid input(s): POCBNP D-Dimer: No results  for input(s): DDIMER in the last 72 hours. Hemoglobin A1C: No results for input(s): HGBA1C in the last 72 hours. Fasting Lipid Panel:  Recent Labs  02/27/16 0500  CHOL 112  HDL 32*  LDLCALC 66  TRIG 68  CHOLHDL 3.5   Thyroid Function Tests: No results for input(s): TSH, T4TOTAL, T3FREE, THYROIDAB in the last 72 hours.  Invalid input(s): FREET3 Anemia Panel: No results for input(s): VITAMINB12, FOLATE, FERRITIN, TIBC, IRON, RETICCTPCT in the last 72 hours. Coag Panel:   Lab Results  Component Value Date   INR 1.46 02/28/2016   INR 1.35 02/27/2016   INR 1.35 02/26/2016    RADIOLOGY: Dg Chest 2 View  02/18/2016  CLINICAL DATA:  Dizziness. EXAM: CHEST  2 VIEW COMPARISON:  January 31, 2016. FINDINGS: The heart size and mediastinal contours are within normal limits. Right-sided pacemaker is unchanged in position. No pneumothorax or pleural effusion is noted. Both lungs are clear. The visualized skeletal structures are unremarkable. IMPRESSION: No active cardiopulmonary disease. Electronically Signed   By: Marijo Conception, M.D.   On: 02/18/2016 10:05   Dg Chest 2 View  01/31/2016  CLINICAL DATA:  70 year old male with shortness of breath EXAM: CHEST  2 VIEW COMPARISON:  Radiograph dated 01/19/2016 FINDINGS: Two views of the chest do not demonstrate a focal consolidation. There is no pleural effusion or pneumothorax. The cardiac silhouette is within normal limits. Right pectoral AICD device. The osseous structures appear unremarkable. IMPRESSION: No active cardiopulmonary disease. Electronically Signed   By: Anner Crete M.D.   On: 01/31/2016 21:16   Dg Abd 1 View  02/24/2016  CLINICAL DATA:  New NG tube position. EXAM: ABDOMEN - 1 VIEW COMPARISON:  02/18/2016 FINDINGS: Enteric tube tip is in the left upper quadrant consistent with location in the upper stomach. Proximal side hole is projected just below the expected location of the EG junction. Left pleural effusion. IMPRESSION:  Enteric tube tip is position in the left upper quadrant consistent with location in the upper stomach. Electronically Signed   By: Lucienne Capers M.D.   On: 02/24/2016 06:01   Dg Abd 1 View  02/18/2016  CLINICAL DATA:  Abdominal cramping with nausea for 3 weeks. EXAM: ABDOMEN - 1 VIEW COMPARISON:  Abdomen plain film dated 01/31/2016. FINDINGS: Fairly large amount of bowel gas is seen throughout the small and large bowel. No dilated bowel loops seen. No evidence of soft tissue mass or abnormal fluid collection. No evidence of free intraperitoneal air. Degenerative changes again noted within the slightly scoliotic lumbar spine. No acute - appearing osseous abnormality. IMPRESSION: 1. Fairly large amount of bowel gas throughout the abdomen, involving both the small and large bowel, which can be an indication of underlying gastroenteritis and/or ileus. 2. No dilated bowel loops seen.  No evidence of bowel obstruction. Electronically Signed   By: Franki Cabot M.D.   On: 02/18/2016 14:35   Ct Angio Chest Pe W/cm &/or Wo Cm  02/22/2016  CLINICAL DATA:  Shortness of breath and chest pain. History of coronary artery disease, myocardial infarction and congestive heart failure. EXAM: CT ANGIOGRAPHY CHEST CT ABDOMEN AND PELVIS WITH CONTRAST TECHNIQUE: Multidetector CT imaging of the chest was performed using the standard protocol during bolus administration of intravenous contrast. Multiplanar CT image reconstructions and MIPs were obtained to evaluate the vascular anatomy. Multidetector CT imaging of the abdomen and pelvis was performed using the standard protocol during bolus administration of intravenous contrast. CONTRAST:  188mL OMNIPAQUE IOHEXOL 350 MG/ML SOLN COMPARISON:  Chest radiographs 02/22/2016 and 02/18/2016. Nuclear medicine pulmonary ventilation and perfusion study 01/31/2016. FINDINGS: CTA CHEST FINDINGS Mediastinum: The pulmonary arteries are well opacified with contrast. There is no evidence of acute  pulmonary embolism. There is atherosclerosis of the aorta, great vessels and coronary arteries.The heart is enlarged. There is a right subclavian pacemaker with leads in the right atrium, right ventricle and coronary sinus. There is no significant pericardial effusion. There are no enlarged mediastinal, hilar or axillary lymph nodes. The thyroid gland, trachea and esophagus demonstrate no significant findings. Lungs/Pleura: There is trace pleural fluid bilaterally. No pneumothorax or pneumomediastinum.Dependent pulmonary opacities bilaterally are most consistent with atelectasis. There is no consolidation or endobronchial lesion. Musculoskeletal/Chest wall: No chest wall lesion or acute osseous findings. CT ABDOMEN and PELVIS FINDINGS Hepatobiliary: The liver is normal in density without focal abnormality. There is a small gallstone. There is no significant gallbladder wall thickening or biliary dilatation. There is no pneumobilia. Pancreas: Unremarkable. No pancreatic ductal dilatation or surrounding inflammatory changes. Spleen: Normal in size without focal abnormality. Adrenals/Urinary Tract: Both adrenal glands appear normal. There are multiple exophytic low-density renal lesions bilaterally which are likely all cysts. Some of these are too small to  optimally characterize. No evidence of enhancing renal mass, urinary tract calculus or hydronephrosis. There is bladder wall thickening with a recent trans urethral resection defect in the prostate gland. Stomach/Bowel: The distal stomach and proximal duodenum demonstrate irregular wall thickening and surrounding inflammation. There is an extraluminal fluid collection extending posteriorly from the duodenal bulb with surrounding extraluminal fluid and air, highly suspicious for perforation of an ulcer. There is a small amount of fluid surrounding the liver and spleen as well as a moderate amount of free intraperitoneal air. Extraluminal air extends into the porta  hepatis. No other bowel wall thickening or significant distention. The appendix appears normal. Vascular/Lymphatic: There are no enlarged abdominal or pelvic lymph nodes. There is diffuse atherosclerosis of the aorta, its branches and the iliac arteries. Reproductive: As above, there is a recent TUR defect within the prostate gland which is heterogeneous in density. Other: As above, pneumoperitoneum with free fluid in the upper abdomen. No generalized ascites or focal extraluminal fluid collection identified. Musculoskeletal: No acute or significant osseous findings. Review of the MIP images confirms the above findings. IMPRESSION: 1. Pneumoperitoneum with extraluminal air and fluid surrounding the distal stomach and proximal duodenum highly suspicious for a perforated peptic ulcer. General surgical evaluation recommended. 2. No evidence of bowel obstruction or abscess. 3. No evidence of acute pulmonary embolism. 4. Small bilateral pleural effusions with mild dependent atelectasis at both lung bases. 5. Moderate diffuse atherosclerosis. 6. Cholelithiasis, bilateral renal cysts and recent postsurgical changes in the prostate gland. 7. Critical Value/emergent results were called by telephone at the time of interpretation on 02/22/2016 at 3:29 pm to Dr. Reyne Dumas , who verbally acknowledged these results. Electronically Signed   By: Richardean Sale M.D.   On: 02/22/2016 15:34   Ct Abdomen Pelvis W Contrast  02/22/2016  CLINICAL DATA:  Shortness of breath and chest pain. History of coronary artery disease, myocardial infarction and congestive heart failure. EXAM: CT ANGIOGRAPHY CHEST CT ABDOMEN AND PELVIS WITH CONTRAST TECHNIQUE: Multidetector CT imaging of the chest was performed using the standard protocol during bolus administration of intravenous contrast. Multiplanar CT image reconstructions and MIPs were obtained to evaluate the vascular anatomy. Multidetector CT imaging of the abdomen and pelvis was performed  using the standard protocol during bolus administration of intravenous contrast. CONTRAST:  126mL OMNIPAQUE IOHEXOL 350 MG/ML SOLN COMPARISON:  Chest radiographs 02/22/2016 and 02/18/2016. Nuclear medicine pulmonary ventilation and perfusion study 01/31/2016. FINDINGS: CTA CHEST FINDINGS Mediastinum: The pulmonary arteries are well opacified with contrast. There is no evidence of acute pulmonary embolism. There is atherosclerosis of the aorta, great vessels and coronary arteries.The heart is enlarged. There is a right subclavian pacemaker with leads in the right atrium, right ventricle and coronary sinus. There is no significant pericardial effusion. There are no enlarged mediastinal, hilar or axillary lymph nodes. The thyroid gland, trachea and esophagus demonstrate no significant findings. Lungs/Pleura: There is trace pleural fluid bilaterally. No pneumothorax or pneumomediastinum.Dependent pulmonary opacities bilaterally are most consistent with atelectasis. There is no consolidation or endobronchial lesion. Musculoskeletal/Chest wall: No chest wall lesion or acute osseous findings. CT ABDOMEN and PELVIS FINDINGS Hepatobiliary: The liver is normal in density without focal abnormality. There is a small gallstone. There is no significant gallbladder wall thickening or biliary dilatation. There is no pneumobilia. Pancreas: Unremarkable. No pancreatic ductal dilatation or surrounding inflammatory changes. Spleen: Normal in size without focal abnormality. Adrenals/Urinary Tract: Both adrenal glands appear normal. There are multiple exophytic low-density renal lesions bilaterally which are likely all  cysts. Some of these are too small to optimally characterize. No evidence of enhancing renal mass, urinary tract calculus or hydronephrosis. There is bladder wall thickening with a recent trans urethral resection defect in the prostate gland. Stomach/Bowel: The distal stomach and proximal duodenum demonstrate irregular  wall thickening and surrounding inflammation. There is an extraluminal fluid collection extending posteriorly from the duodenal bulb with surrounding extraluminal fluid and air, highly suspicious for perforation of an ulcer. There is a small amount of fluid surrounding the liver and spleen as well as a moderate amount of free intraperitoneal air. Extraluminal air extends into the porta hepatis. No other bowel wall thickening or significant distention. The appendix appears normal. Vascular/Lymphatic: There are no enlarged abdominal or pelvic lymph nodes. There is diffuse atherosclerosis of the aorta, its branches and the iliac arteries. Reproductive: As above, there is a recent TUR defect within the prostate gland which is heterogeneous in density. Other: As above, pneumoperitoneum with free fluid in the upper abdomen. No generalized ascites or focal extraluminal fluid collection identified. Musculoskeletal: No acute or significant osseous findings. Review of the MIP images confirms the above findings. IMPRESSION: 1. Pneumoperitoneum with extraluminal air and fluid surrounding the distal stomach and proximal duodenum highly suspicious for a perforated peptic ulcer. General surgical evaluation recommended. 2. No evidence of bowel obstruction or abscess. 3. No evidence of acute pulmonary embolism. 4. Small bilateral pleural effusions with mild dependent atelectasis at both lung bases. 5. Moderate diffuse atherosclerosis. 6. Cholelithiasis, bilateral renal cysts and recent postsurgical changes in the prostate gland. 7. Critical Value/emergent results were called by telephone at the time of interpretation on 02/22/2016 at 3:29 pm to Dr. Reyne Dumas , who verbally acknowledged these results. Electronically Signed   By: Richardean Sale M.D.   On: 02/22/2016 15:34   Nm Pulmonary Perf And Vent  02/01/2016  CLINICAL DATA:  Shortness of breath and left-sided chest pain. EXAM: NUCLEAR MEDICINE VENTILATION - PERFUSION LUNG  SCAN TECHNIQUE: Ventilation images were obtained in multiple projections using inhaled aerosol Tc-53m DTPA. Perfusion images were obtained in multiple projections after intravenous injection of Tc-14m MAA. RADIOPHARMACEUTICALS:  [id number] millicuries [id number] DTPA aerosol inhalation and [id number] millicuries [id number] MAA IV COMPARISON:  Chest radiograph 01/31/2016 FINDINGS: Ventilation: Focal defect in the right upper lung corresponding to cardiac pacemaker. Otherwise, no significant ventilation defect is identified. Perfusion: No wedge shaped peripheral perfusion defects to suggest acute pulmonary embolism. IMPRESSION: Very low probability of pulmonary embolus. Electronically Signed   By: Lucienne Capers M.D.   On: 02/01/2016 01:18   Dg Chest Port 1 View  02/24/2016  CLINICAL DATA:  Confirmed tip of the left-sided PICC line. EXAM: PORTABLE CHEST 1 VIEW COMPARISON:  Chest x-ray of earlier today. FINDINGS: The PICC line tip appears to have been withdrawn partially sensitive tip overlies the distal third of the SVC. The right internal jugular venous catheter tip projects over the midportion of the SVC. The other support tubes and lines are in reasonable position. IMPRESSION: The PICC line tip has been withdrawn approximately 2 cm and appears to overlie the distal third of the SVC. Electronically Signed   By: David  Martinique M.D.   On: 02/24/2016 15:44   Dg Chest Port 1 View  02/24/2016  CLINICAL DATA:  Status post PICC line placement EXAM: PORTABLE CHEST 1 VIEW COMPARISON:  02/24/2016 FINDINGS: Nasogastric catheter, defibrillator and right jugular central line are again seen and stable. A new left-sided PICC line is noted with the catheter tip in the mid  right atrium. This could be withdrawn 2-3 cm. Cardiac shadow is stable. The lungs are hypoinflated with minimal left basilar atelectasis. IMPRESSION: Status post PICC line as described.  This could be withdrawn 2-3 cm. Electronically Signed   By: Inez Catalina  M.D.   On: 02/24/2016 13:29   Dg Chest Port 1 View  02/24/2016  CLINICAL DATA:  Acute respiratory failure with hypoxia, CHF, coronary artery disease, former smoker. EXAM: PORTABLE CHEST 1 VIEW COMPARISON:  Portable chest x-ray of February 22, 2016 FINDINGS: The endotracheal tube is not clearly evident and may have been removed. The lungs are mildly hypoinflated. Persistent increased density at both lung bases is demonstrated. There is further obscuration of the left hemidiaphragm. The cardiac silhouette is enlarged. The central pulmonary vascularity is prominent. The right internal jugular venous catheter tip projects over the midportion of the SVC. The esophagogastric tubes proximal port lies at or just below the GE junction. The pacemaker-defibrillator electrodes are in stable position. IMPRESSION: Probable extubation of the trachea. Persistent mild hypoinflation with worsening bibasilar atelectasis or pneumonia on the left. A small amount of pleural fluid is likely present at both lung bases. Advancement of the nasogastric tube by 10 cm is recommended to assure that the proximal port remains below the GE junction. Electronically Signed   By: David  Martinique M.D.   On: 02/24/2016 07:31   Portable Chest Xray  02/22/2016  CLINICAL DATA:  Acute respiratory failure with hypoxia EXAM: PORTABLE CHEST 1 VIEW COMPARISON:  02/22/2016 FINDINGS: Endotracheal tube 4.3 cm above the carina. Internal jugular central line on the right with tip at the cavoatrial junction. Two lead cardiac pacer noted. NG tube appears to cross the gastroesophageal junction. Mild cardiac enlargement stable. Venous congestion without edema. Mild hypoventilatory change at both lung bases. IMPRESSION: Mild bibasilar opacity most consistent with atelectasis. Electronically Signed   By: Skipper Cliche M.D.   On: 02/22/2016 21:07   Dg Chest Port 1 View  02/22/2016  CLINICAL DATA:  Onset of chest pain and shortness of breath now. EXAM: PORTABLE CHEST 1  VIEW COMPARISON:  PA and lateral chest x-ray of February 18, 2016 FINDINGS: The lungs are mildly hypoinflated. The interstitial markings are increased bilaterally. The cardiac silhouette is enlarged and the pulmonary vascularity is engorged. The permanent pacemaker defibrillator is in stable position. The mediastinum is normal in width. There is no pleural effusion or pneumothorax. IMPRESSION: CHF with mild pulmonary interstitial edema. There is no alveolar edema nor pneumonia. Electronically Signed   By: David  Martinique M.D.   On: 02/22/2016 08:24   Dg Abd 2 Views  01/31/2016  CLINICAL DATA:  70 year old male with abdominal pain and bloating constipation. EXAM: ABDOMEN - 2 VIEW COMPARISON:  Chest radiograph dated 01/31/2016 FINDINGS: Constipation. There is no evidence of bowel obstruction or free air. No radiopaque calculi noted. There is degenerative changes of the spine. No acute fracture. AICD leads partially visualized. IMPRESSION: Constipation.  No bowel obstruction or free air. Electronically Signed   By: Anner Crete M.D.   On: 01/31/2016 23:20   Dg Ugi W/water Sol Cm  02/27/2016  CLINICAL DATA:  Exploratory laparotomy with patch of duodenum ulcer on 02/22/2016 EXAM: WATER SOLUBLE UPPER GI SERIES TECHNIQUE: Single-column upper GI series was performed using water soluble contrast. COMPARISON:  CT abdomen pelvis dated 02/22/2016 FLUOROSCOPY TIME:  Radiation Exposure Index (as provided by the fluoroscopic device): 124.4 mGy FINDINGS: Nasogastric tube in the stomach. Patient drank water-soluble contrast orally via straw. Normal gastric folds.  Irregular outpouching of contrast along the duodenal bulb in the region of the patient's known duodenal ulcer status post repair (for example, series 12-13). However, this contrast did not freely spill into the peritoneal cavity and also did not drain along the patient's surgical drain. Contrast passed into the distal duodenum and proximal jejunum. IMPRESSION: No  evidence of leak. Irregular outpouching of contrast along the duodenal bulb in the region of the patient's known duodenal ulcer. No spill into the peritoneal cavity. Electronically Signed   By: Julian Hy M.D.   On: 02/27/2016 15:43    Assessment & Plan  1.  Symptomatic anemia /GI bleed- Transfused 2Units PRBC on 3/8 given Cardiomyopathy  Follow hemoglobin count = was 9.3 yesterday and now 8.5.  2. Cardiomyopathy, ischemic-EF [id number] Chronic systolic CHF (congestive heart failure) (HCC) - no active HF exacerbation Sx. CXR looks clear. Avoid XS volume load. -- On standing diuretic dosing. ~20 mg IV daily while NPO. Biventricular implantable cardioverter-defibrillator [employer]. Supratherapeutic INR - reversed.   3. Chronic a-fib (HCC) / Chronic anticoagulation-Couamdin held for surgery & GIB.   Rate still in 90-104 range - likely driven by discomfort & anemia.  Continue Metoprolol to IV 5 mg q4hr for better HR control. BP  tolerating.  IV Heparin started 2/2 Popliteal DVT - would continue until able to restart warfarin & complete bridge. (could use Lovenox if no active bleed on IV Heparin)   4. CAD S/P LAD PCI- 2000 - no antecedent angina. Would expect a low Troponin elevation with existing CAD & Cardiomyopathy, but would simply continue home Rx as best possible.   Restart oral medications when able take by mouth. For now continue IV beta blocker. No active symptoms.  5. C. difficile colitis - per PCCM  6. Duodenal ulcer with perforation ([facility]) - s/p repair  7. Hypotension - stable; tolerating IV BB. -- Blood pressure improved after transfusion. Tolerated increased beta blocker dose.  8. CKD (chronic kidney disease) stage 3, GFR 30-59 ml/min: stable - CR improved following transfusion.  9.  Hypokalemia - replete              [name] Margarita, MD  02/28/2016  10:23 AM

## 2016-02-28 NOTE — Progress Notes (Signed)
6 Days Post-Op  Subjective: Pt pleasant   Objective: Vital signs in last 24 hours: Temp:  [97.3 F (36.3 C)-98.3 F (36.8 C)] 98.2 F (36.8 C) (03/12 0730) Pulse Rate:  [94-118] 94 (03/12 0730) Resp:  [15-24] 21 (03/12 0730) BP: (101-132)/(43-81) 101/57 mmHg (03/12 0730) SpO2:  [97 %-100 %] 100 % (03/12 0730) Last BM Date: 02/27/16  Intake/Output from previous day: 03/11 0701 - 03/12 0700 In: 3592.7 [I.V.:594; NG/GT:90; IV Piggyback:750; TPN:2158.7] Out: 2317.5 [Urine:2125; Emesis/NG output:190; Drains:2.5] Intake/Output this shift: Total I/O In: -  Out: 250 [Urine:250]  Incision/Wound:vac in place soft no rebound or guarding  JP minimal  UGI shows no leak  Lab Results:   Recent Labs  02/27/16 0500 02/28/16 0504  WBC 12.2* 12.4*  HGB 9.3* 8.5*  HCT 29.2* 26.8*  PLT 314 337   BMET  Recent Labs  02/27/16 0500 02/28/16 0504  NA 141 139  K 3.4* 3.3*  CL 105 107  CO2 26 26  GLUCOSE 119* 125*  BUN 17 20  CREATININE 0.94 1.04  CALCIUM 8.5* 8.3*   PT/INR  Recent Labs  02/27/16 0500 02/28/16 0505  LABPROT 16.8* 17.8*  INR 1.35 1.46   ABG No results for input(s): PHART, HCO3 in the last 72 hours.  Invalid input(s): PCO2, PO2  Studies/Results: Dg Ugi W/water Sol Cm  02/27/2016  CLINICAL DATA:  Exploratory laparotomy with patch of duodenum ulcer on 02/22/2016 EXAM: WATER SOLUBLE UPPER GI SERIES TECHNIQUE: Single-column upper GI series was performed using water soluble contrast. COMPARISON:  CT abdomen pelvis dated 02/22/2016 FLUOROSCOPY TIME:  Radiation Exposure Index (as provided by the fluoroscopic device): 124.4 mGy FINDINGS: Nasogastric tube in the stomach. Patient drank water-soluble contrast orally via straw. Normal gastric folds. Irregular outpouching of contrast along the duodenal bulb in the region of the patient's known duodenal ulcer status post repair (for example, series 12-13). However, this contrast did not freely spill into the peritoneal  cavity and also did not drain along the patient's surgical drain. Contrast passed into the distal duodenum and proximal jejunum. IMPRESSION: No evidence of leak. Irregular outpouching of contrast along the duodenal bulb in the region of the patient's known duodenal ulcer. No spill into the peritoneal cavity. Electronically Signed   By: Julian Hy M.D.   On: 02/27/2016 15:43    Anti-infectives: Anti-infectives    Start     Dose/Rate Route Frequency Ordered Stop   02/25/16 1400  piperacillin-tazobactam (ZOSYN) IVPB 3.375 g     3.375 g 12.5 mL/hr over 240 Minutes Intravenous 3 times per day 02/25/16 1231     02/25/16 1100  metroNIDAZOLE (FLAGYL) IVPB 500 mg     500 mg 100 mL/hr over 60 Minutes Intravenous Every 8 hours 02/25/16 1034     02/25/16 1100  ciprofloxacin (CIPRO) IVPB 400 mg  Status:  Discontinued     400 mg 200 mL/hr over 60 Minutes Intravenous Every 12 hours 02/25/16 1034 02/25/16 1210   02/23/16 0000  piperacillin-tazobactam (ZOSYN) IVPB 3.375 g  Status:  Discontinued    Comments:  Zosyn 3.375 g IV q8h for CrCl > 20 mL/min   3.375 g 12.5 mL/hr over 240 Minutes Intravenous 3 times per day 02/22/16 2351 02/25/16 1034   02/22/16 2245  metroNIDAZOLE (FLAGYL) IVPB 500 mg  Status:  Discontinued     500 mg 100 mL/hr over 60 Minutes Intravenous 3 times per day 02/22/16 2230 02/24/16 1030   02/22/16 2230  metroNIDAZOLE (FLAGYL) IVPB 500 mg  Status:  Discontinued     500 mg 100 mL/hr over 60 Minutes Intravenous Every 8 hours 02/22/16 2229 02/22/16 2230   02/22/16 1630  clindamycin (CLEOCIN) IVPB 600 mg    Comments:  Pharmacy may adjust dosing strength, interval, or rate of medication as needed for optimal therapy for the patient Send with patient on call to the OR.  Anesthesia to complete antibiotic administration <48min prior to incision per [facility]   600 mg 100 mL/hr over 30 Minutes Intravenous On call to O.R. 02/22/16 1617 02/22/16 1915   02/19/16 0430  vancomycin  (VANCOCIN) 50 mg/mL oral solution 125 mg  Status:  Discontinued     125 mg Oral 4 times daily 02/19/16 0417 02/25/16 1034      Assessment/Plan: s/p Procedure(s): EXPLORATORY LAPAROTOMY WITH  PATCH OF DUODENAL ULCER (N/A) Study shows no leak JP minimal  Remove NGT  Start clears   LOS: 10 days    [name] A. 02/28/2016

## 2016-02-28 NOTE — Progress Notes (Addendum)
PARENTERAL NUTRITION CONSULT NOTE - FOLLOW UP  Pharmacy Consult for TPN Indication: Prolonged ileus  Allergies  Allergen Reactions  . Keflex [Cephalexin] Shortness Of Breath and Other (See Comments)    dizziness    Patient Measurements: Height: '5\' 10"'  (177.8 cm) Weight: 216 lb 11.4 oz (98.3 kg) IBW/kg (Calculated) : 73 Adjusted Body Weight:  Usual Weight:   Vital Signs: Temp: 98.2 F (36.8 C) (03/12 0730) Temp Source: Oral (03/12 0730) BP: 101/57 mmHg (03/12 0730) Pulse Rate: 94 (03/12 0730) Intake/Output from previous day: 03/11 0701 - 03/12 0700 In: 3592.7 [I.V.:594; NG/GT:90; IV Piggyback:750; TPN:2158.7] Out: 2317.5 [Urine:2125; Emesis/NG output:190; Drains:2.5] Intake/Output from this shift: Total I/O In: -  Out: 250 [Urine:250]  Labs:  Recent Labs  02/26/16 0532 02/27/16 0500 02/28/16 0504 02/28/16 0505  WBC 11.3* 12.2* 12.4*  --   HGB 9.1* 9.3* 8.5*  --   HCT 28.9* 29.2* 26.8*  --   PLT 280 314 337  --   INR 1.35 1.35  --  1.46     Recent Labs  02/26/16 0532 02/27/16 0500 02/28/16 0504  NA 142 141 139  K 3.5 3.4* 3.3*  CL 109 105 107  CO2 '23 26 26  ' GLUCOSE 130* 119* 125*  BUN '16 17 20  ' CREATININE 1.07 0.94 1.04  CALCIUM 8.2* 8.5* 8.3*  MG  --   --  1.8  PHOS 2.3* 2.7  --   PROT  --  5.3* 5.2*  ALBUMIN  --  2.1* 1.9*  AST  --  16 20  ALT  --  11* 12*  ALKPHOS  --  32* 30*  BILITOT  --  0.4 0.5  TRIG  --  68  --   CHOLHDL  --  3.5  --   CHOL  --  112  --    Estimated Creatinine Clearance: 79.9 mL/min (by C-G formula based on Cr of 1.04).    Recent Labs  02/27/16 1709 02/27/16 2346 02/28/16 0728  GLUCAP 99 127* 131*    Medications:  Scheduled:  . chlorhexidine gluconate  15 mL Mouth Rinse BID  . furosemide  20 mg Intravenous Daily  . insulin aspart  0-9 Units Subcutaneous 3 times per day  . magnesium sulfate 1 - 4 g bolus IVPB  2 g Intravenous Once  . metoprolol  5 mg Intravenous Q4H  . metronidazole  500 mg Intravenous  Q8H  . pantoprazole (PROTONIX) IV  80 mg Intravenous Q12H  . piperacillin-tazobactam (ZOSYN)  IV  3.375 g Intravenous 3 times per day  . potassium chloride  10 mEq Intravenous Q1 Hr x 5  . sodium chloride flush  10-40 mL Intracatheter Q12H    Insulin Requirements in the past 24 hours:  0 unit sens SSI yesterday  Assessment: 65 YOM presented on 02/18/16 with hypotension and dizziness from GIB. Found to have perforated duodenum and was taken to the OR on 02/22/16 for ex-lap with Phillip Heal patch of duodenal ulcer. Pharmacy consulted to initiate TPN for nutritional support given expected prolonged bowel rest.  GI: admitted with GIB on high dose PPI IV BID, baseline prealbumin low at 8.3.Abd softer with neg pressure dressing, min output from JP drain. No leak on UGI.  Plan to go slow with diet. Drains < 51m. NGT d/c'd 3/12 & CL diet ordered. Endo: TSH WNL. No hx DM - CBGs controlled with minimal SSI needs  Lytes: K+ 3.3 (~4 runs yest, goal >/= 4 for ileus), Mg 1.8 Renal: CKD3, s/p  recent TURP (2/6) - SCr improved to 1.04, BUN WNL. UOP 09 ml/kg/hr, NS at 10 ml/hr. I/O 1.3L (3/11: net positive 5.8L since admit) Pulm: intubated for procedure, extubated 3/7 to RA, required [place]-2L briefly on 3/9, but now on RA again - [place]onebs Cards: CAD / ICM / AFib / DL / AICD / CHF - BP soft, tachy (in Afib, & anemic). No active CHF & CXR clear - Lopressor IV, Lasix 67m IV daily AC: Coumadin PTA for hx AFib >> held d/t GIB. New DVT on 3/7 >> heparin gtt started 3/8 - anemic but stable (anemia panel WNL), plts WNL, INR 1.67  Hepatobil: mild elevated alk phos, others WNL. TG WNL. Neuro: depression - A&O, pain score 0-7, PRN Fentanyl available Heme: Anemia 2nd GIB, PRBC x 2 on 3/9 with Hg improved to 9.3, stable today. ID: Flagyl IV for C.diff colitis + Zosyn for intra-abd infxn, long course of Cipro PTA - afebrile, WBC 12.2  Best Practices: heparin gtt TPN Access: right IJ placed 02/22/16, PICC placed  02/24/16 TPN start date: 02/24/16  Current Nutrition:  TPN  Nutritional Goals:  2000-2200 kCal, 120-130 grams of protein per day  Plan:  - Cont Clinimix E 5/15 to 80 ml/hr (goal 100 ml/hr) and continue ILE 20% at 10 ml/hr. Will not be able to concentrate TPN given limitation of premixed Clinimix. Watch volume, respiratory status - Daily multivitamin, folate and trace elements in TPN - Change SSI to q8 - K runs x 5 per MD - Mg 2g IV x 1 per MD - TPN labs qMon/Thur  KGracy Bruins PharmD Clinical Pharmacist [facility]

## 2016-02-28 NOTE — Progress Notes (Signed)
ANTICOAGULATION CONSULT NOTE - Follow Up Consult  Pharmacy Consult for Heparin Indication: new bilateral DVT, hx of afib (Coumadin on hold)  Allergies  Allergen Reactions  . Keflex [Cephalexin] Shortness Of Breath and Other (See Comments)    dizziness    Patient Measurements: Height: 5\' 10"  (177.8 cm) Weight: 216 lb 11.4 oz (98.3 kg) IBW/kg (Calculated) : 73 Heparin Dosing Weight: 93 kg  Vital Signs: Temp: 98.2 F (36.8 C) (03/12 0730) Temp Source: Oral (03/12 0730) BP: 101/57 mmHg (03/12 0730) Pulse Rate: 94 (03/12 0730)  Labs:  Recent Labs  02/26/16 0532  02/27/16 0500 02/27/16 1554 02/27/16 2248 02/28/16 0504 02/28/16 0505  HGB 9.1*  --  9.3*  --   --  8.5*  --   HCT 28.9*  --  29.2*  --   --  26.8*  --   PLT 280  --  314  --   --  337  --   LABPROT 16.8*  --  16.8*  --   --   --  17.8*  INR 1.35  --  1.35  --   --   --  1.46  HEPARINUNFRC 0.16*  < > 0.57 0.20* 0.60  --  0.63  CREATININE 1.07  --  0.94  --   --  1.04  --   < > = values in this interval not displayed.  Estimated Creatinine Clearance: 79.9 mL/min (by C-G formula based on Cr of 1.04).   Medications:  Infusions:  . Marland KitchenTPN (CLINIMIX-E) Adult 80 mL/hr at 02/28/16 0400   And  . fat emulsion 240 mL (02/28/16 0400)  . heparin 2,200 Units/hr (02/28/16 0400)    Assessment: 70 yo M with hx of afib on warfarin PTA which was held on admit for GIB and surgery for perforated duodenal ulcer.  Now s/p OR.  New bilateral LE DVTs started on heparin infusion 3/8.  Heparin level 0.63, therapeutic on 2200 units/hr. Hgb 8.5 & plt wnl, no noted bleeding.  Goal of Therapy:  Heparin level 0.3-0.7 units/ml Monitor platelets by anticoagulation protocol: Yes   Plan:  Continue heparin 2200 units/h Daily heparin level and CBC Follow-up plans for restarting Coumadin Monitor s/sx bleeding   Heloise Ochoa, Pharm.D., BCPS PGY2 Cardiology Pharmacy Resident Pager: [phone] 02/28/2016 7:50 AM

## 2016-02-28 NOTE — Progress Notes (Signed)
[facility] TEAM 1 - Stepdown/ICU TEAM Progress Note  [name] [id number] DOB: 07-29-1946 DOA: 02/18/2016 PCP: [name], MD  Admit HPI / Brief Narrative: 70 y.o. WM PMHx Depression, CAD native artery, MI, Ischemic Cardiomyopathy with cardiac pacemaker, Persistent Atrial Fibrillation on Coumadin, HLD, Chronic Renal Insufficiency,  Patient presenting today for complaints of hypotension and dizziness. Patient states that he's had various somatic complaints ever since he had a TURP procedure performed on 01/25/2016. Of note he complains of intermittent abdominal cramping, bloating, constipation. Over the last 24 hours he is experienced extreme fatigue, dizziness upon standing and shortness of breath or dyspnea on exertion. Patient has been taking ciprofloxacin ever since his surgery. Patient also states that his blood pressure medications were changed 2 weeks ago due to hypotension. His dose of carvedilol was decreased and his losartan was stopped. Patient is also had some mild nausea without vomiting but denies chest pain, palpitations, neck stiffness, headache, LOC, head trauma. Patient has taken BP at home and states systolic as low as [id number].   HPI/Subjective: 3/12 A/O 4, abdomen appropriately tender for post surgery. Negative N/V with clear liquids    Assessment/Plan:  Acute respiratory failure with hypoxia. -Oxygen to keep SpO2 > 92% -DuoNeb QID  Hypotension 2nd to acute blood loss from GI bleed -Resolved.  Chronic systolic CHF s/p AICD  -LVEF 20 to 25% from Echo 01/06/16. -Metoprolol IV 5 mg QID  A Fib with RVR,/ CAD,  -Rate Improved but not well-controlled, may partially be secondary to pain.  -Start digoxin 0.125 mg daily  -See systolic CHF  HLD. -Lipid panel pending, not on medication prior to admission  B/l lower extremity DVT  -Continue heparin drip -Prior to discharge will need to make decision on anticoagulant  CKD stage 3. -Resolved Cr WNL  Urinary  retention S/p TURP 01/25/16.   Acute GI bleed from perforated duodenal ulcer  -s/p Phillip Heal patch 3/06. -Protonix  gtt per surgery -TNA/nutrition, wound care per surgery  Acute blood loss Anemia/ Critical illness anemia  -Transfuse for Hb < 7 -SCD's  C diff colitis  - not certain if he is absorbing enteral vancomycin. -Peritonitis 2nd to perforated duodenal ulcer -Continue Flagyl  Hyperglycemia. -Sensitive SSI   Hypophosphatemia -WNL  Hypokalemia. -Potassium goal > 4  -Potassium IV 50 mEq   Hypomagnesemia -Magnesium goal> 2 -Magnesium IV 2 gm    Code Status: FULL Family Communication: Wife present at time of exam Disposition Plan: Per surgery    Consultants: Dr.Haywood Ingram [facility] Dr.David Loren Racer [facility]  [facility] M    Procedure/Significant Events: 3/06 CT chest >> no PE 3/06 CT abd/pelvis >> pneumoperitoneum 3/06 Pneumoperitoneum on CT abd >> Surgery consulted; cardiology consulted 3/6 graham patch perforated duodenal ulcer,19 Fr blake drain near patch 3/07 Doppler legs b/l >> DVT Rt soleal vein, DVT Lt peroneal vein 3/08 transfuse PRBC 3/11 UGI water-soluble contrast;Irregular outpouching of contrast along the duodenal bulb in the region of the patient's known duodenal ulcer. No spill into the peritoneal cavity.  Culture 3/02 C diff PCR >> positive  Antibiotics: 3/02 Enteral vancomycin >> 3/09 3/06 Zosyn >> 3/09 3/09 Cipro 1 dose 3/06 Flagyl >>    DVT prophylaxis: Heparin drip    Devices    LINES / TUBES:  Abdominal wound VAC in place    Continuous Infusions: . Marland KitchenTPN (CLINIMIX-E) Adult 80 mL/hr at 02/28/16 2000   And  . fat emulsion 240 mL (02/28/16 2000)  . heparin 2,200 Units/hr (02/28/16 2000)  Objective: VITAL SIGNS: Temp: 98.4 F (36.9 C) (03/12 2000) Temp Source: Oral (03/12 2000) BP: 117/69 mmHg (03/12 2000) Pulse Rate: 88 (03/12 2000) SPO2; FIO2:   Intake/Output Summary (Last 24 hours) at 02/28/16  2109 Last data filed at 02/28/16 2000  Gross per 24 hour  Intake 3546.5 ml  Output    851 ml  Net 2695.5 ml     Exam: General: A/O 4, abdomen appropriately tender for post surgery., No acute respiratory distress Eyes: Negative headache, negative scleral hemorrhage ENT: Negative Runny nose, negative gingival bleeding, Neck:  Negative scars, masses, torticollis, lymphadenopathy, JVD Lungs: Clear to auscultation bilaterally without wheezes or crackles Cardiovascular: Regular irregular rhythm and rate ,without murmur gallop or rub normal S1 and S2 Abdomen: Positive abdominal pain, midline incision with wound VAC in place appropriately tender, nondistended, positive soft, bowel sounds, no rebound, no ascites, no appreciable mass Extremities: No significant cyanosis, clubbing, or edema bilateral lower extremities Psychiatric:  Negative depression, negative anxiety, negative fatigue, negative mania  Neurologic:  Cranial nerves II through XII intact, tongue/uvula midline, all extremities muscle strength 5/5, sensation intact throughout,  negative dysarthria, negative expressive aphasia, negative receptive aphasia.   Data Reviewed: Basic Metabolic Panel:  Recent Labs Lab 02/23/16 0330 02/24/16 0450 02/25/16 0206 02/26/16 0532 02/27/16 0500 02/28/16 0504 02/28/16 1034  NA 141 143 143 142 141 139  --   K 4.1 4.0 3.4* 3.5 3.4* 3.3*  --   CL 110 112* 107 109 105 107  --   CO2 21* 21* 24 23 26 26   --   GLUCOSE 114* 99 141* 130* 119* 125*  --   BUN 18 17 17 16 17 20   --   CREATININE 1.47* 1.51* 1.40* 1.07 0.94 1.04  --   CALCIUM 8.5* 8.4* 8.5* 8.2* 8.5* 8.3*  --   MG 1.8  --  2.1  --   --  1.8  --   PHOS 3.8  --  2.6 2.3* 2.7  --  5.7*   Liver Function Tests:  Recent Labs Lab 02/23/16 0330 02/24/16 0450 02/25/16 0206 02/27/16 0500 02/28/16 0504  AST 45* 28 18 16 20   ALT 24 19 16* 11* 12*  ALKPHOS 27* 37* 35* 32* 30*  BILITOT 0.5 0.8 0.6 0.4 0.5  PROT 5.5* 5.1* 5.6* 5.3*  5.2*  ALBUMIN 2.3* 2.0* 2.1* 2.1* 1.9*    Recent Labs Lab 02/22/16 0932  LIPASE 87*   No results for input(s): AMMONIA in the last 168 hours. CBC:  Recent Labs Lab 02/24/16 0846 02/24/16 2000 02/25/16 0206 02/26/16 0532 02/27/16 0500 02/28/16 0504  WBC 12.5*  --  13.2* 11.3* 12.2* 12.4*  NEUTROABS  --   --  10.9*  --  8.2* 8.2*  HGB 7.1* 9.3* 9.3* 9.1* 9.3* 8.5*  HCT 21.2* 27.5* 27.2* 28.9* 29.2* 26.8*  MCV 81.2  --  81.4 81.9 81.6 81.0  PLT 231  --  243 280 314 337   Cardiac Enzymes:  Recent Labs Lab 02/22/16 0932 02/22/16 1338 02/22/16 2300  TROPONINI <0.03 <0.03 0.03   BNP (last 3 results)  Recent Labs  02/18/16 0940  BNP 156.7*    ProBNP (last 3 results) No results for input(s): PROBNP in the last 8760 hours.  CBG:  Recent Labs Lab 02/27/16 1709 02/27/16 2346 02/28/16 0728 02/28/16 1143 02/28/16 1906  GLUCAP 99 127* 131* 103* 149*    Recent Results (from the past 240 hour(s))  C difficile quick scan w PCR reflex  Status: Abnormal   Collection Time: 02/18/16 11:00 PM  Result Value Ref Range Status   C Diff antigen POSITIVE (A) NEGATIVE Final   C Diff toxin POSITIVE (A) NEGATIVE Final   C Diff interpretation Positive for toxigenic C. difficile  Final    Comment: CRITICAL RESULT CALLED TO, READ BACK BY AND VERIFIED WITH: AMY KORSLIEN,RN @0108  02/19/16 MKELLY   Surgical pcr screen     Status: None   Collection Time: 02/22/16  4:27 PM  Result Value Ref Range Status   MRSA, PCR NEGATIVE NEGATIVE Final   Staphylococcus aureus NEGATIVE NEGATIVE Final    Comment:        The Xpert SA Assay (FDA approved for NASAL specimens in patients over 38 years of age), is one component of a comprehensive surveillance program.  Test performance has been validated by [facility] for patients greater than or equal to 75 year old. It is not intended to diagnose infection nor to guide or monitor treatment.      Studies:  Recent x-ray studies have  been reviewed in detail by the Attending Physician  Scheduled Meds:  Scheduled Meds: . furosemide  20 mg Intravenous Daily  . insulin aspart  0-9 Units Subcutaneous 3 times per day  . metoprolol  5 mg Intravenous Q4H  . metronidazole  500 mg Intravenous Q8H  . pantoprazole (PROTONIX) IV  80 mg Intravenous Q12H  . piperacillin-tazobactam (ZOSYN)  IV  3.375 g Intravenous 3 times per day  . sodium chloride flush  10-40 mL Intracatheter Q12H    Time spent on care of this patient: 40 mins   Sabre Romberger, Geraldo Docker , MD  [facility] Office  [phone] Pager - [phone]  On-Call/Text Page:      [email or web address]      password [email or web address]  If 7PM-7AM, please contact night-coverage [email or web address] Password [email or web address] 02/28/2016, 9:09 PM   LOS: 10 days   Care during the described time interval was provided by me .  I have reviewed this patient's available data, including medical history, events of note, physical examination, and all test results as part of my evaluation. I have personally reviewed and interpreted all radiology studies.   Dia Crawford, MD [phone] Pager

## 2016-02-29 DIAGNOSIS — I4891 Unspecified atrial fibrillation: Secondary | ICD-10-CM

## 2016-02-29 DIAGNOSIS — K275 Chronic or unspecified peptic ulcer, site unspecified, with perforation: Secondary | ICD-10-CM

## 2016-02-29 LAB — CBC WITH DIFFERENTIAL/PLATELET
BASOS PCT: 0 %
Basophils Absolute: 0 10*3/uL (ref 0.0–0.1)
Eosinophils Absolute: 0.4 10*3/uL (ref 0.0–0.7)
Eosinophils Relative: 4 %
HEMATOCRIT: 32 % — AB (ref 39.0–52.0)
HEMOGLOBIN: 9.9 g/dL — AB (ref 13.0–17.0)
LYMPHS ABS: 2 10*3/uL (ref 0.7–4.0)
LYMPHS PCT: 18 %
MCH: 25.6 pg — AB (ref 26.0–34.0)
MCHC: 30.9 g/dL (ref 30.0–36.0)
MCV: 82.7 fL (ref 78.0–100.0)
MONOS PCT: 8 %
Monocytes Absolute: 0.9 10*3/uL (ref 0.1–1.0)
NEUTROS ABS: 7.5 10*3/uL (ref 1.7–7.7)
NEUTROS PCT: 70 %
Platelets: 334 10*3/uL (ref 150–400)
RBC: 3.87 MIL/uL — ABNORMAL LOW (ref 4.22–5.81)
RDW: 16.5 % — ABNORMAL HIGH (ref 11.5–15.5)
WBC: 10.8 10*3/uL — ABNORMAL HIGH (ref 4.0–10.5)

## 2016-02-29 LAB — COMPREHENSIVE METABOLIC PANEL
ALBUMIN: 1.9 g/dL — AB (ref 3.5–5.0)
ALK PHOS: 32 U/L — AB (ref 38–126)
ALT: 16 U/L — ABNORMAL LOW (ref 17–63)
ANION GAP: 8 (ref 5–15)
AST: 26 U/L (ref 15–41)
BUN: 20 mg/dL (ref 6–20)
CHLORIDE: 106 mmol/L (ref 101–111)
CO2: 25 mmol/L (ref 22–32)
CREATININE: 1.15 mg/dL (ref 0.61–1.24)
Calcium: 8.4 mg/dL — ABNORMAL LOW (ref 8.9–10.3)
GFR calc Af Amer: >60 mL/min (ref >60)
GFR calc non Af Amer: >60 mL/min (ref >60)
Glucose, Bld: 117 mg/dL — ABNORMAL HIGH (ref 65–99)
POTASSIUM: 3.6 mmol/L (ref 3.5–5.1)
SODIUM: 139 mmol/L (ref 135–145)
Total Bilirubin: 0.3 mg/dL (ref 0.3–1.2)
Total Protein: 5.1 g/dL — ABNORMAL LOW (ref 6.5–8.1)

## 2016-02-29 LAB — PREALBUMIN: Prealbumin: 11.7 mg/dL — ABNORMAL LOW (ref 18–38)

## 2016-02-29 LAB — TRIGLYCERIDES: Triglycerides: 53 mg/dL (ref <150)

## 2016-02-29 LAB — PHOSPHORUS: PHOSPHORUS: 3.5 mg/dL (ref 2.5–4.6)

## 2016-02-29 LAB — GLUCOSE, CAPILLARY
GLUCOSE-CAPILLARY: 93 mg/dL (ref 65–99)
GLUCOSE-CAPILLARY: 107 mg/dL — AB (ref 65–99)
GLUCOSE-CAPILLARY: 108 mg/dL — AB (ref 65–99)
Glucose-Capillary: 104 mg/dL — ABNORMAL HIGH (ref 65–99)
Glucose-Capillary: 109 mg/dL — ABNORMAL HIGH (ref 65–99)

## 2016-02-29 LAB — PROTIME-INR
INR: 1.48 (ref 0.00–1.49)
Prothrombin Time: 18 seconds — ABNORMAL HIGH (ref 11.6–15.2)

## 2016-02-29 LAB — MAGNESIUM: MAGNESIUM: 2 mg/dL (ref 1.7–2.4)

## 2016-02-29 LAB — HEPARIN LEVEL (UNFRACTIONATED)
HEPARIN UNFRACTIONATED: 0.46 [IU]/mL (ref 0.30–0.70)
Heparin Unfractionated: 0.72 IU/mL — ABNORMAL HIGH (ref 0.30–0.70)

## 2016-02-29 MED ORDER — FAT EMULSION 20 % IV EMUL
240.0000 mL | INTRAVENOUS | Status: AC
Start: 2016-02-29 — End: 2016-03-01
  Administered 2016-02-29: 240 mL via INTRAVENOUS
  Filled 2016-02-29: qty 250

## 2016-02-29 MED ORDER — DIPHENHYDRAMINE HCL 25 MG PO CAPS: 25.0000 mg | ORAL_CAPSULE | Freq: Once | ORAL | Status: DC

## 2016-02-29 MED ORDER — TRACE MINERALS CR-CU-MN-SE-ZN 10-1000-500-60 MCG/ML IV SOLN
INTRAVENOUS | Status: AC
  Administered 2016-02-29: 18:00:00 via INTRAVENOUS
  Filled 2016-02-29: qty 1920

## 2016-02-29 MED ORDER — DIPHENHYDRAMINE HCL 50 MG/ML IJ SOLN
12.5000 mg | Freq: Three times a day (TID) | INTRAMUSCULAR | Status: DC | PRN
  Administered 2016-02-29 – 2016-03-01 (×2): 12.5 mg via INTRAVENOUS
  Filled 2016-02-29 (×2): qty 1

## 2016-02-29 NOTE — Progress Notes (Signed)
PARENTERAL NUTRITION CONSULT NOTE - FOLLOW UP  Pharmacy Consult for TPN Indication: Prolonged ileus  Allergies  Allergen Reactions  . Keflex [Cephalexin] Shortness Of Breath and Other (See Comments)    dizziness    Patient Measurements: Height: 5\' 10"  (177.8 cm) Weight: 216 lb 11.4 oz (98.3 kg) IBW/kg (Calculated) : 73 Adjusted Body Weight:  Usual Weight:   Vital Signs: Temp: 98.3 F (36.8 C) (03/13 0752) Temp Source: Oral (03/13 0752) BP: 114/67 mmHg (03/13 0345) Pulse Rate: 96 (03/13 0345) Intake/Output from previous day: 03/12 0701 - 03/13 0700 In: 3348.2 [I.V.:539.7; IV Piggyback:650; TPN:2158.5] Out: 1026 [Urine:975; Emesis/NG output:50; Stool:1] Intake/Output from this shift: Total I/O In: 20 [P.O.:20] Out: 200 [Urine:200]  Labs:  Recent Labs  02/27/16 0500 02/28/16 0504 02/28/16 0505 02/29/16 0509 02/29/16 0510  WBC 12.2* 12.4*  --  10.8*  --   HGB 9.3* 8.5*  --  9.9*  --   HCT 29.2* 26.8*  --  32.0*  --   PLT 314 337  --  334  --   INR 1.35  --  1.46  --  1.48     Recent Labs  02/27/16 0500 02/28/16 0504 02/28/16 1034 02/29/16 0509  NA 141 139  --  139  K 3.4* 3.3*  --  3.6  CL 105 107  --  106  CO2 26 26  --  25  GLUCOSE 119* 125*  --  117*  BUN 17 20  --  20  CREATININE 0.94 1.04  --  1.15  CALCIUM 8.5* 8.3*  --  8.4*  MG  --  1.8  --  2.0  PHOS 2.7  --  5.7* 3.5  PROT 5.3* 5.2*  --  5.1*  ALBUMIN 2.1* 1.9*  --  1.9*  AST 16 20  --  26  ALT 11* 12*  --  16*  ALKPHOS 32* 30*  --  32*  BILITOT 0.4 0.5  --  0.3  PREALBUMIN  --   --   --  11.7*  TRIG 68  --   --  53  CHOLHDL 3.5  --   --   --   CHOL 112  --   --   --    Estimated Creatinine Clearance: 72.3 mL/min (by C-G formula based on Cr of 1.15).    Recent Labs  02/28/16 1906 02/29/16 0520 02/29/16 0752  GLUCAP 149* 104* 93    Medications:  Scheduled:  . digoxin  0.125 mg Intravenous Daily  . furosemide  20 mg Intravenous Daily  . insulin aspart  0-9 Units  Subcutaneous 3 times per day  . metoprolol  5 mg Intravenous Q4H  . metronidazole  500 mg Intravenous Q8H  . pantoprazole (PROTONIX) IV  80 mg Intravenous Q12H  . piperacillin-tazobactam (ZOSYN)  IV  3.375 g Intravenous 3 times per day  . sodium chloride flush  10-40 mL Intracatheter Q12H    Insulin Requirements in the past 24 hours:  0 units sens SSI yesterday  Assessment: 50 YOM presented on 02/18/16 with hypotension and dizziness from GIB. Found to have a perforated duodenum and was taken to the OR on 02/22/16 for ex-lap with Phillip Heal patch of duodenal ulcer. Pharmacy consulted to initiate TPN for nutritional support given expected prolonged bowel rest.  GI: admitted with GIB on high dose PPI IV BID, baseline prealbumin low at 8.3. NGT d/c'd 3/12 & CL diet ordered (minimal intake noted). Nauseated Prealbumin up to 11.7. +BM.  Endo: TSH WNL.  No hx DM - CBGs controlled (93-149) with minimal SSI needs   Lytes: K+ 3.6 (goal >/= 4 for ileus), Mg 2, Phos 3.5.  Renal: CKD3, s/p recent TURP (2/6) - SCr 1.15 up slightly , BUN WNL. UOP 0.4 ml/kg/hr, NS at 10 ml/hr. I/O +2.3 L/24 (+8.6L total)  Pulm: 2L  Cards: CAD / ICM / AFib / DL / AICD / CHF - BP soft, tachy (in Afib, & anemic). No active CHF & CXR clear - Digoxin IV, Lopressor IV, Lasix 20mg  IV daily  AC: Coumadin PTA for hx AFib >> held d/t GIB. New DVT on 3/7 > heparin gtt started 3/8.  Hepatobil:LFTs WNL. TG WNL 53  Neuro: depression -  Heme: Anemia 2nd GIB, PRBC x 2 on 3/9 with Hg improved to 9.9,  ID: Flagyl IV for C.diff colitis + Zosyn for intra-abd infxn, long course of Cipro PTA - afebrile, WBC 12.2>10.9  Best Practices: heparin gtt, IV PPI  TPN Access: right IJ placed 02/22/16, PICC placed 02/24/16  TPN start date: 02/24/16  Current Nutrition: TPN  Nutritional Goals:  2000-2200 kCal, 120-130 grams of protein per day (goal 176ml/hr)  Plan:  - Will cont Clinimix E 5/15 at 80 ml/hr ((1843 kcal (92%), 96g protein  (80%)) and continue IV Lipids 20% at 10 ml/hr. Will not be able to concentrate TPN given limitation of premixed Clinimix.Will not increase rate due to additional volume. - Daily multivitamin, folate and trace elements in TPN - Change SSI to q8 - TPN labs qMon/Thur    Ozie Lupe S. Alford Highland, PharmD, [place] Clinical Staff Pharmacist Pager [phone]

## 2016-02-29 NOTE — Progress Notes (Signed)
[facility] TEAM 1 - Stepdown/ICU TEAM PROGRESS NOTE  [name] [id number] DOB: Jun 24, 1946 DOA: 02/18/2016 PCP: [name], MD  Admit HPI / Brief Narrative: 70 y.o. male who presented w/ complaints of hypotension and dizziness. Patient stated he had various somatic complaints ever since he had a TURP on 01/25/2016, to include intermittent abdominal cramping, bloating, constipation. Over the 24 hours prior to his admit he experienced extreme fatigue, dizziness upon standing, and shortness of breath on exertion. Patient had been taking ciprofloxacin since his surgery. Patient stated his blood pressure medications were changed 2 weeks prior due to hypotension. Patient took his BP at home and stated systolic was in the [id number].   Significant Events: 3/06 CT chest > no PE - CT abd/pelvis > pneumoperitoneum 3/6 emergent graham patch repair for perforated duodenal ulcer,19 Fr blake drain near patch 3/07 Doppler legs B DVT Rt soleal vein, Lt peroneal vein 3/08 transfuse PRBC 3/11 UGI water - soluble contrast; Irregular outpouching of contrast along the duodenal bulb in the region of the patient's known duodenal ulcer. No spill into the peritoneal cavity.  HPI/Subjective: The patient is in good spirits.  He denies chest pain shortness breath fevers chills nausea or vomiting.  He denies abdominal pain at the present time.  Assessment/Plan:  Perforated duodenal ulcer  s/p emergent Phillip Heal patch 3/06 - ongoing care per Gen Surgery   Acute respiratory failure with hypoxia Resolved  B LE DVT On heparin gtt  GI bleed - FOBT positive / melena  Now clearly appears to have been due to duodenal ulcer in setting of signif coagulopathy - colonoscopy approximate 4 years ago which showed few colonic polyps - Hgb did previously stabilize coincident w/ correction of coagulopathy, but then pt perforated his ulcer while hospitalized (was not on PPI at time due to active C diff colitis)  Acute blood loss  anemia  Baseline Hgb 14 - Hgb 9.8 at presentation - is hemodynamically stable at present - Hgb stable at this time  Coagulopathy due to supratherapeutic warfarin  Due to warfarin + cipro - reversed - now on IV heparin and tolerating w/o evidence of bleeding   Hypotension due to acute blood loss  Resolved w/ volume expansion   C diff colitis  Due to prolonged course of cipro - was on oral vanc prior to perforation - currently on flagyl, but also on zosyn - will need to assess for diarrhea once intake improves/is advanced   Chronic Systolic CHF - Ischemic - s/p CRT D EF 20% - well compensated at present after coming in dehydrated - follow Is/Os and weights - Cardiology following   Afib w/ RVR  CHA2DS2-VASc 4 - intolerant of amio - QT too long for Tikosyn - rate presently controlled - followed by Dr. Martinique - Cardiology following - on IV heparin   CAD Remote anterior MI - keep Hgb 8.0 or >  CKD stage 3 Baseline crt 1.7 - crt better than baseline presently   Code Status: FULL Family Communication: no family present at time of exam today  Disposition Plan: SDU  Consultants: Gen Surgery  PCCM [facility] Cardiology   Procedures: none  Antibiotics: 3/02 Oral vancomycin > 3/08 3/06 Zosyn >  3/09 Cipro 3/06 Flagyl >   DVT prophylaxis: Heparin gtt  Objective: Blood pressure 120/70, pulse 92, temperature 98.6 F (37 C), temperature source Oral, resp. rate 27, height 5\' 10"  (1.778 m), weight 98.3 kg (216 lb 11.4 oz), SpO2 97 %.  Intake/Output Summary (Last 24  hours) at 02/29/16 1541 Last data filed at 02/29/16 1201  Gross per 24 hour  Intake 3235.38 ml  Output   1077 ml  Net 2158.38 ml   Exam: General: No acute respiratory distress - alert and pleasant  Lungs: Clear to auscultation bilaterally - no wheeze Cardiovascular: Regular rate without murmur   Abdomen: Mildly tender diffusely, nondistended, soft, bowel sounds positive, no rebound Extremities: No significant cyanosis,  clubbing, or edema bilateral lower extremities  Data Reviewed:  Basic Metabolic Panel:  Recent Labs Lab 02/23/16 0330  02/25/16 0206 02/26/16 0532 02/27/16 0500 02/28/16 0504 02/28/16 1034 02/29/16 0509  NA 141  < > 143 142 141 139  --  139  K 4.1  < > 3.4* 3.5 3.4* 3.3*  --  3.6  CL 110  < > 107 109 105 107  --  106  CO2 21*  < > 24 23 26 26   --  25  GLUCOSE 114*  < > 141* 130* 119* 125*  --  117*  BUN 18  < > 17 16 17 20   --  20  CREATININE 1.47*  < > 1.40* 1.07 0.94 1.04  --  1.15  CALCIUM 8.5*  < > 8.5* 8.2* 8.5* 8.3*  --  8.4*  MG 1.8  --  2.1  --   --  1.8  --  2.0  PHOS 3.8  --  2.6 2.3* 2.7  --  5.7* 3.5  < > = values in this interval not displayed.  CBC:  Recent Labs Lab 02/25/16 0206 02/26/16 0532 02/27/16 0500 02/28/16 0504 02/29/16 0509  WBC 13.2* 11.3* 12.2* 12.4* 10.8*  NEUTROABS 10.9*  --  8.2* 8.2* 7.5  HGB 9.3* 9.1* 9.3* 8.5* 9.9*  HCT 27.2* 28.9* 29.2* 26.8* 32.0*  MCV 81.4 81.9 81.6 81.0 82.7  PLT 243 280 314 337 334    Liver Function Tests:  Recent Labs Lab 02/24/16 0450 02/25/16 0206 02/27/16 0500 02/28/16 0504 02/29/16 0509  AST 28 18 16 20 26   ALT 19 16* 11* 12* 16*  ALKPHOS 37* 35* 32* 30* 32*  BILITOT 0.8 0.6 0.4 0.5 0.3  PROT 5.1* 5.6* 5.3* 5.2* 5.1*  ALBUMIN 2.0* 2.1* 2.1* 1.9* 1.9*   Coags:  Recent Labs Lab 02/25/16 0745 02/26/16 0532 02/27/16 0500 02/28/16 0505 02/29/16 0510  INR 1.54* 1.35 1.35 1.46 1.48    Recent Results (from the past 240 hour(s))  Surgical pcr screen     Status: None   Collection Time: 02/22/16  4:27 PM  Result Value Ref Range Status   MRSA, PCR NEGATIVE NEGATIVE Final   Staphylococcus aureus NEGATIVE NEGATIVE Final    Comment:        The Xpert SA Assay (FDA approved for NASAL specimens in patients over 7 years of age), is one component of a comprehensive surveillance program.  Test performance has been validated by [facility] for patients greater than or equal to 41 year old. It  is not intended to diagnose infection nor to guide or monitor treatment.      Studies:   Recent x-ray studies have been reviewed in detail by the Attending Physician  Scheduled Meds:  Scheduled Meds: . digoxin  0.125 mg Intravenous Daily  . furosemide  20 mg Intravenous Daily  . insulin aspart  0-9 Units Subcutaneous 3 times per day  . metoprolol  5 mg Intravenous Q4H  . metronidazole  500 mg Intravenous Q8H  . pantoprazole (PROTONIX) IV  80 mg Intravenous Q12H  .  piperacillin-tazobactam (ZOSYN)  IV  3.375 g Intravenous 3 times per day  . sodium chloride flush  10-40 mL Intracatheter Q12H    Time spent on care of this patient: 35 mins   Tristram Milian T , MD   [facility] Office  [phone] Pager - Text Page per Shea Evans as per below:  On-Call/Text Page:      [email or web address]      password [email or web address]  If 7PM-7AM, please contact night-coverage [email or web address] Password [email or web address] 02/29/2016, 3:41 PM   LOS: 11 days

## 2016-02-29 NOTE — Progress Notes (Signed)
Physical Therapy Treatment Patient Details Name: [name] MRN: [id number] DOB: Nov 19, 1946 Today's Date: 02/29/2016    History of Present Illness Patient is a 70 y/o male admitted with UGI bleeding & Perforated DU now s/p  Phillip Heal patch closure.  Also now with bilat DVT's.  PMH positive for recent TURP, a-fib, Chronic systolic CHF, Cardiomyopathy, ischemic-EF 20-25% w/ ICD.    PT Comments    Mr. Newby made modest progress today, ambulating w/ very decreased gait speed in room using RW.  HR ranging 120s-149 while ambulating and in 90s at rest.  Pt will benefit from continued skilled PT services to increase functional independence and safety.   Follow Up Recommendations  SNF;Supervision/Assistance - 24 hour     Equipment Recommendations  Rolling walker with 5" wheels    Recommendations for Other Services       Precautions / Restrictions Precautions Precautions: Fall Precaution Comments: enteric precautions Restrictions Weight Bearing Restrictions: No    Mobility  Bed Mobility Overal bed mobility: Needs Assistance Bed Mobility: Rolling;Sidelying to Sit Rolling: Min guard Sidelying to sit: Min assist;HOB elevated       General bed mobility comments: Cues for log roll technique and assist to lift trunk.  Pt uses bed rail.  Transfers Overall transfer level: Needs assistance Equipment used: Rolling walker (2 wheeled) Transfers: Sit to/from Stand Sit to Stand: Min assist         General transfer comment: Cues for hand placement and proper use of RW.  Pt slow to stand and braces posterior LEs on bed to steady.  Ambulation/Gait Ambulation/Gait assistance: Min guard Ambulation Distance (Feet): 35 Feet Assistive device: Rolling walker (2 wheeled) Gait Pattern/deviations: Decreased stride length   Gait velocity interpretation: <1.8 ft/sec, indicative of risk for recurrent falls General Gait Details: Very decreased gait speed and cues provided for sequencing using  RW.  Pt demonstrates upright posture.  HR ranging 120's-149 while ambulating.   Stairs            Wheelchair Mobility    Modified Rankin (Stroke Patients Only)       Balance Overall balance assessment: Needs assistance Sitting-balance support: Feet supported;Single extremity supported Sitting balance-[name] Scale: Good     Standing balance support: Bilateral upper extremity supported;During functional activity Standing balance-[name] Scale: Poor Standing balance comment: Relies on support from RW.                    Cognition Arousal/Alertness: Awake/alert Behavior During Therapy: WFL for tasks assessed/performed Overall Cognitive Status: Within Functional Limits for tasks assessed                      Exercises General Exercises - Lower Extremity Ankle Circles/Pumps: AROM;Both;10 reps;Supine Gluteal Sets: Strengthening;Both;10 reps;Supine    General Comments General comments (skin integrity, edema, etc.): HR 90's at rest.      Pertinent Vitals/Pain Pain Assessment: Faces Faces Pain Scale: Hurts little more Pain Location: abdomen w/ mobility Pain Descriptors / Indicators: Aching;Discomfort Pain Intervention(s): Limited activity within patient's tolerance;Monitored during session;Repositioned    Home Living                      Prior Function            PT Goals (current goals can now be found in the care plan section) Acute Rehab PT Goals Patient Stated Goal: To go to rehab and then silver sneakers PT Goal Formulation: With patient Time For Goal Achievement:  03/10/16 Potential to Achieve Goals: Good Progress towards PT goals: Progressing toward goals    Frequency  Min 3X/week    PT Plan Current plan remains appropriate    Co-evaluation             End of Session   Activity Tolerance: Treatment limited secondary to medical complications (Comment);Patient limited by fatigue;Patient tolerated treatment well  (HR) Patient left: in chair;with call bell/phone within reach (w/ pressure relief pad in seat)     Time: [id number] PT Time Calculation (min) (ACUTE ONLY): 34 min  Charges:  $Gait Training: 8-22 mins $Therapeutic Exercise: 8-22 mins                    G Codes:      Collie Siad PT, DPT  Pager: [phone] Phone: [phone] 02/29/2016, 3:20 PM

## 2016-02-29 NOTE — Progress Notes (Signed)
Subjective:  No CP/SOB or abd pain  Objective:  Temp:  [98.3 F (36.8 C)-98.6 F (37 C)] 98.6 F (37 C) (03/13 1200) Pulse Rate:  [88-105] 96 (03/13 0345) Resp:  [17-20] 17 (03/13 0345) BP: (110-117)/(62-69) 114/67 mmHg (03/13 0345) SpO2:  [97 %-100 %] 97 % (03/13 0345) Weight change:   Intake/Output from previous day: 03/12 0701 - 03/13 0700 In: 3348.2 [I.V.:539.7; IV Piggyback:650; TPN:2158.5] Out: 1026 [Urine:975; Emesis/NG output:50; Stool:1]  Intake/Output from this shift: Total I/O In: 20 [P.O.:20] Out: 351 [Urine:350; Stool:1]  Physical Exam: General appearance: alert and no distress Neck: no adenopathy, no carotid bruit, no JVD, supple, symmetrical, trachea midline and thyroid not enlarged, symmetric, no tenderness/mass/nodules Lungs: clear to auscultation bilaterally Heart: irregularly irregular rhythm Extremities: extremities normal, atraumatic, no cyanosis or edema  Lab Results: Results for orders placed or performed during the hospital encounter of 02/18/16 (from the past 48 hour(s))  Glucose, capillary     Status: Abnormal   Collection Time: 02/27/16  3:43 PM  Result Value Ref Range   Glucose-Capillary 125 (H) 65 - 99 mg/dL  Heparin level (unfractionated)     Status: Abnormal   Collection Time: 02/27/16  3:54 PM  Result Value Ref Range   Heparin Unfractionated 0.20 (L) 0.30 - 0.70 IU/mL    Comment:        IF HEPARIN RESULTS ARE BELOW EXPECTED VALUES, AND PATIENT DOSAGE HAS BEEN CONFIRMED, SUGGEST FOLLOW UP TESTING OF ANTITHROMBIN III LEVELS.   Glucose, capillary     Status: None   Collection Time: 02/27/16  5:09 PM  Result Value Ref Range   Glucose-Capillary 99 65 - 99 mg/dL  Heparin level (unfractionated)     Status: None   Collection Time: 02/27/16 10:48 PM  Result Value Ref Range   Heparin Unfractionated 0.60 0.30 - 0.70 IU/mL    Comment:        IF HEPARIN RESULTS ARE BELOW EXPECTED VALUES, AND PATIENT DOSAGE HAS BEEN  CONFIRMED, SUGGEST FOLLOW UP TESTING OF ANTITHROMBIN III LEVELS.   Glucose, capillary     Status: Abnormal   Collection Time: 02/27/16 11:46 PM  Result Value Ref Range   Glucose-Capillary 127 (H) 65 - 99 mg/dL   Comment 1 Notify RN   CBC with Differential/Platelet     Status: Abnormal   Collection Time: 02/28/16  5:04 AM  Result Value Ref Range   WBC 12.4 (H) 4.0 - 10.5 K/uL   RBC 3.31 (L) 4.22 - 5.81 MIL/uL   Hemoglobin 8.5 (L) 13.0 - 17.0 g/dL   HCT 26.8 (L) 39.0 - 52.0 %   MCV 81.0 78.0 - 100.0 fL   MCH 25.7 (L) 26.0 - 34.0 pg   MCHC 31.7 30.0 - 36.0 g/dL   RDW 16.4 (H) 11.5 - 15.5 %   Platelets 337 150 - 400 K/uL   Neutrophils Relative % 66 %   Neutro Abs 8.2 (H) 1.7 - 7.7 K/uL   Lymphocytes Relative 20 %   Lymphs Abs 2.5 0.7 - 4.0 K/uL   Monocytes Relative 10 %   Monocytes Absolute 1.2 (H) 0.1 - 1.0 K/uL   Eosinophils Relative 4 %   Eosinophils Absolute 0.5 0.0 - 0.7 K/uL   Basophils Relative 0 %   Basophils Absolute 0.0 0.0 - 0.1 K/uL  Comprehensive metabolic panel     Status: Abnormal   Collection Time: 02/28/16  5:04 AM  Result Value Ref Range   Sodium 139 135 - 145 mmol/L  Potassium 3.3 (L) 3.5 - 5.1 mmol/L   Chloride 107 101 - 111 mmol/L   CO2 26 22 - 32 mmol/L   Glucose, Bld 125 (H) 65 - 99 mg/dL   BUN 20 6 - 20 mg/dL   Creatinine, Ser 1.04 0.61 - 1.24 mg/dL   Calcium 8.3 (L) 8.9 - 10.3 mg/dL   Total Protein 5.2 (L) 6.5 - 8.1 g/dL   Albumin 1.9 (L) 3.5 - 5.0 g/dL   AST 20 15 - 41 U/L   ALT 12 (L) 17 - 63 U/L   Alkaline Phosphatase 30 (L) 38 - 126 U/L   Total Bilirubin 0.5 0.3 - 1.2 mg/dL   GFR calc non Af Amer >60 >60 mL/min   GFR calc Af Amer >60 >60 mL/min    Comment: (NOTE) The eGFR has been calculated using the CKD EPI equation. This calculation has not been validated in all clinical situations. eGFR's persistently <60 mL/min signify possible Chronic Kidney Disease.    Anion gap 6 5 - 15  Magnesium     Status: None   Collection Time: 02/28/16   5:04 AM  Result Value Ref Range   Magnesium 1.8 1.7 - 2.4 mg/dL  Heparin level (unfractionated)     Status: None   Collection Time: 02/28/16  5:05 AM  Result Value Ref Range   Heparin Unfractionated 0.63 0.30 - 0.70 IU/mL    Comment:        IF HEPARIN RESULTS ARE BELOW EXPECTED VALUES, AND PATIENT DOSAGE HAS BEEN CONFIRMED, SUGGEST FOLLOW UP TESTING OF ANTITHROMBIN III LEVELS.   Protime-INR     Status: Abnormal   Collection Time: 02/28/16  5:05 AM  Result Value Ref Range   Prothrombin Time 17.8 (H) 11.6 - 15.2 seconds   INR 1.46 0.00 - 1.49  Glucose, capillary     Status: Abnormal   Collection Time: 02/28/16  7:28 AM  Result Value Ref Range   Glucose-Capillary 131 (H) 65 - 99 mg/dL   Comment 1 Notify RN   Phosphorus     Status: Abnormal   Collection Time: 02/28/16 10:34 AM  Result Value Ref Range   Phosphorus 5.7 (H) 2.5 - 4.6 mg/dL  Glucose, capillary     Status: Abnormal   Collection Time: 02/28/16 11:43 AM  Result Value Ref Range   Glucose-Capillary 103 (H) 65 - 99 mg/dL  Glucose, capillary     Status: Abnormal   Collection Time: 02/28/16  7:06 PM  Result Value Ref Range   Glucose-Capillary 149 (H) 65 - 99 mg/dL  Comprehensive metabolic panel     Status: Abnormal   Collection Time: 02/29/16  5:09 AM  Result Value Ref Range   Sodium 139 135 - 145 mmol/L   Potassium 3.6 3.5 - 5.1 mmol/L   Chloride 106 101 - 111 mmol/L   CO2 25 22 - 32 mmol/L   Glucose, Bld 117 (H) 65 - 99 mg/dL   BUN 20 6 - 20 mg/dL   Creatinine, Ser 1.15 0.61 - 1.24 mg/dL   Calcium 8.4 (L) 8.9 - 10.3 mg/dL   Total Protein 5.1 (L) 6.5 - 8.1 g/dL   Albumin 1.9 (L) 3.5 - 5.0 g/dL   AST 26 15 - 41 U/L   ALT 16 (L) 17 - 63 U/L   Alkaline Phosphatase 32 (L) 38 - 126 U/L   Total Bilirubin 0.3 0.3 - 1.2 mg/dL   GFR calc non Af Amer >60 >60 mL/min   GFR calc Af Amer >60 >  60 mL/min    Comment: (NOTE) The eGFR has been calculated using the CKD EPI equation. This calculation has not been validated in  all clinical situations. eGFR's persistently <60 mL/min signify possible Chronic Kidney Disease.    Anion gap 8 5 - 15  Magnesium     Status: None   Collection Time: 02/29/16  5:09 AM  Result Value Ref Range   Magnesium 2.0 1.7 - 2.4 mg/dL  Phosphorus     Status: None   Collection Time: 02/29/16  5:09 AM  Result Value Ref Range   Phosphorus 3.5 2.5 - 4.6 mg/dL  Prealbumin     Status: Abnormal   Collection Time: 02/29/16  5:09 AM  Result Value Ref Range   Prealbumin 11.7 (L) 18 - 38 mg/dL  CBC with Differential/Platelet     Status: Abnormal   Collection Time: 02/29/16  5:09 AM  Result Value Ref Range   WBC 10.8 (H) 4.0 - 10.5 K/uL   RBC 3.87 (L) 4.22 - 5.81 MIL/uL   Hemoglobin 9.9 (L) 13.0 - 17.0 g/dL   HCT 32.0 (L) 39.0 - 52.0 %   MCV 82.7 78.0 - 100.0 fL   MCH 25.6 (L) 26.0 - 34.0 pg   MCHC 30.9 30.0 - 36.0 g/dL   RDW 16.5 (H) 11.5 - 15.5 %   Platelets 334 150 - 400 K/uL   Neutrophils Relative % 70 %   Neutro Abs 7.5 1.7 - 7.7 K/uL   Lymphocytes Relative 18 %   Lymphs Abs 2.0 0.7 - 4.0 K/uL   Monocytes Relative 8 %   Monocytes Absolute 0.9 0.1 - 1.0 K/uL   Eosinophils Relative 4 %   Eosinophils Absolute 0.4 0.0 - 0.7 K/uL   Basophils Relative 0 %   Basophils Absolute 0.0 0.0 - 0.1 K/uL  Triglycerides     Status: None   Collection Time: 02/29/16  5:09 AM  Result Value Ref Range   Triglycerides 53 <150 mg/dL  Heparin level (unfractionated)     Status: Abnormal   Collection Time: 02/29/16  5:10 AM  Result Value Ref Range   Heparin Unfractionated 0.72 (H) 0.30 - 0.70 IU/mL    Comment:        IF HEPARIN RESULTS ARE BELOW EXPECTED VALUES, AND PATIENT DOSAGE HAS BEEN CONFIRMED, SUGGEST FOLLOW UP TESTING OF ANTITHROMBIN III LEVELS.   Protime-INR     Status: Abnormal   Collection Time: 02/29/16  5:10 AM  Result Value Ref Range   Prothrombin Time 18.0 (H) 11.6 - 15.2 seconds   INR 1.48 0.00 - 1.49  Glucose, capillary     Status: Abnormal   Collection Time: 02/29/16   5:20 AM  Result Value Ref Range   Glucose-Capillary 104 (H) 65 - 99 mg/dL  Glucose, capillary     Status: None   Collection Time: 02/29/16  7:52 AM  Result Value Ref Range   Glucose-Capillary 93 65 - 99 mg/dL  Glucose, capillary     Status: Abnormal   Collection Time: 02/29/16 12:01 PM  Result Value Ref Range   Glucose-Capillary 107 (H) 65 - 99 mg/dL    Imaging: Imaging results have been reviewed  Tele- Intermittently paced rhythm, underlying rhythm afib (I have personally reviewed)  Assessment/Plan:   1. Principal Problem: 2.   Symptomatic anemia 3. Active Problems: 4.   CAD S/P LAD PCI- 2000 5.   Chronic systolic CHF (congestive heart failure) ([place]) 6.   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   BPH- s/p  TURP 01/25/16 - followed by 4 weeks of Cipro 8.   GI bleed 9.   Hypotension 10.   Chronic a-fib ([place]) 11.   S/P TURP 12.   CKD (chronic kidney disease) stage 3, GFR 30-59 ml/min 13.   Supratherapeutic INR 14.   Cardiomyopathy, ischemic-EF 20-25% 15.   Chronic anticoagulation-Couamdin 16.   Epigastric pain 17.   C. difficile colitis 18.   Acute respiratory failure with hypoxia ([facility]) 19.   Perforated ulcer ([place]) 20.   Duodenal ulcer with perforation ([place]) 21.   Abdominal pain 22.   Bleeding gastrointestinal 23.   Chronic kidney disease, stage III (moderate) 24.   Urinary retention 25.   Acute GI bleeding 26.   Hyperglycemia 27.   Hypophosphatemia 28.   Hypokalemia 29.   AICD (automatic cardioverter/defibrillator) present 30.   Atrial fibrillation with RVR ([facility]) 31.   HLD (hyperlipidemia) 32.   Perforated duodenal ulcer ([place]) 33.   Hypomagnesemia 34.   Time Spent Directly with Patient:  20 minutes  Length of Stay:  LOS: 11 days   GIB secondary to perf DU. Surgically corrected. ISCM with EF 20%. PTVPM. Afib on Coumadin AC. CDiff. He also has pop vein DVTs on IV hep. Hgb stable. No S/O CHF. NPO except for CL. Home meds on hold.  Getting dig and IV BB. Once able to take PO would restart home meds . Start coumadin per primary service and Surg when GI stable. Will need bridge given DVT. Will follow with you.   Quay Burow 02/29/2016, 1:55 PM

## 2016-02-29 NOTE — Progress Notes (Signed)
Pharmacy Antibiotic Note  [name] is a 70 y.o. male admitted on 02/18/2016 with intra-abdominal infection.  Pharmacy has been consulted for Zosyn dosing.  Day #7/7? of Zosyn for intra-abdominal infxn. PO vanc for C. Diff >> switched to IV Flagyl 3/8 due to unsure of absorption of PO vanc. Patient has no history of C. Diff. Afebrile, WBC stable at 10.8. Surgery stating to continue abx?  Plan: Continue Zosyn 3.375 gm IV q8h (4 hour infusion) Monitor clinical picture, renal function F/U abx deescalation / LOT  Consider need to continue Zosyn? Received a week of treatment.  On Flagyl 500mg  IV Q8 will need to continue treatment 7-10 days past Zosyn  Height: 5\' 10"  (177.8 cm) Weight: 216 lb 11.4 oz (98.3 kg) IBW/kg (Calculated) : 73  Temp (24hrs), Avg:98.4 F (36.9 C), Min:98.3 F (36.8 C), Max:98.6 F (37 C)   Recent Labs Lab 02/25/16 0206 02/26/16 0532 02/27/16 0500 02/28/16 0504 02/29/16 0509  WBC 13.2* 11.3* 12.2* 12.4* 10.8*  CREATININE 1.40* 1.07 0.94 1.04 1.15    Estimated Creatinine Clearance: 72.3 mL/min (by C-G formula based on Cr of 1.15).    Allergies  Allergen Reactions  . Keflex [Cephalexin] Shortness Of Breath and Other (See Comments)    dizziness    Antimicrobials this admission: Zosyn 3/7 >> Vanc PO 3/3 >> 3/8 IV Flagyl 3/6 >>  Dose adjustments this admission: n/a  Microbiology results: 3/2 cdiff PCR (+) antigen and toxin 3/2 UC: neg Surgical PCR negative  Thank you for allowing pharmacy to be a part of this patient's care.  Reginia Naas 02/29/2016 3:30 PM

## 2016-02-29 NOTE — Progress Notes (Signed)
ANTICOAGULATION CONSULT NOTE - Follow Up Consult  Pharmacy Consult for Heparin Indication: new bilateral DVT, hx of afib (Coumadin on hold)  Allergies  Allergen Reactions  . Keflex [Cephalexin] Shortness Of Breath and Other (See Comments)    dizziness    Patient Measurements: Height: 5\' 10"  (177.8 cm) Weight: 216 lb 11.4 oz (98.3 kg) IBW/kg (Calculated) : 73 Heparin Dosing Weight: 93 kg  Vital Signs: Temp: 98.3 F (36.8 C) (03/13 0335) Temp Source: Oral (03/13 0335) BP: 114/67 mmHg (03/13 0345) Pulse Rate: 96 (03/13 0345)  Labs:  Recent Labs  02/27/16 0500  02/27/16 2248 02/28/16 0504 02/28/16 0505 02/29/16 0509 02/29/16 0510  HGB 9.3*  --   --  8.5*  --  9.9*  --   HCT 29.2*  --   --  26.8*  --  32.0*  --   PLT 314  --   --  337  --  334  --   LABPROT 16.8*  --   --   --  17.8*  --  18.0*  INR 1.35  --   --   --  1.46  --  1.48  HEPARINUNFRC 0.57  < > 0.60  --  0.63  --  0.72*  CREATININE 0.94  --   --  1.04  --   --   --   < > = values in this interval not displayed.  Estimated Creatinine Clearance: 79.9 mL/min (by C-G formula based on Cr of 1.04).  Assessment: 70 yo M with hx of afib on warfarin PTA which was held on admit for GIB and surgery for perforated duodenal ulcer.  Now s/p OR.  New bilateral LE DVTs started on heparin infusion 3/8.  Heparin level up to slightly supratherapeutic on 2200 units/hr. No bleeding noted.   Goal of Therapy:  Heparin level 0.3-0.7 units/ml Monitor platelets by anticoagulation protocol: Yes   Plan:  Decrease heparin to 2100 units/h F/u 6 hr heparin level to verify that heparin level still not trending up Follow-up plans for restarting Coumadin  Sherlon Handing, PharmD, BCPS Clinical pharmacist, pager [phone] 02/29/2016 5:37 AM

## 2016-02-29 NOTE — Progress Notes (Signed)
7 Days Post-Op  Subjective: Patient is awake and alert Tolerating some clears, but gets nauseated with the smell of the broth + BM  Objective: Vital signs in last 24 hours: Temp:  [97.7 F (36.5 C)-98.4 F (36.9 C)] 98.3 F (36.8 C) (03/13 0752) Pulse Rate:  [88-105] 96 (03/13 0345) Resp:  [17-26] 17 (03/13 0345) BP: (110-117)/(62-71) 114/67 mmHg (03/13 0345) SpO2:  [97 %-100 %] 97 % (03/13 0345) Last BM Date: 02/27/16  Intake/Output from previous day: 03/12 0701 - 03/13 0700 In: 3348.2 [I.V.:539.7; IV Piggyback:650; TPN:2158.5] Out: 1026 [Urine:975; Emesis/NG output:50; Stool:1] Intake/Output this shift: Total I/O In: 20 [P.O.:20] Out: 200 [Urine:200]  General appearance: alert, cooperative and no distress Resp: clear to auscultation bilaterally Cardio: regular rate and rhythm, S1, S2 normal, no murmur, click, rub or gallop GI: soft, + BS; incisional tenderness VAC with good seal - due to be changed today  Lab Results:   Recent Labs  02/28/16 0504 02/29/16 0509  WBC 12.4* 10.8*  HGB 8.5* 9.9*  HCT 26.8* 32.0*  PLT 337 334   BMET  Recent Labs  02/28/16 0504 02/29/16 0509  NA 139 139  K 3.3* 3.6  CL 107 106  CO2 26 25  GLUCOSE 125* 117*  BUN 20 20  CREATININE 1.04 1.15  CALCIUM 8.3* 8.4*   PT/INR  Recent Labs  02/28/16 0505 02/29/16 0510  LABPROT 17.8* 18.0*  INR 1.46 1.48   ABG No results for input(s): PHART, HCO3 in the last 72 hours.  Invalid input(s): PCO2, PO2  Studies/Results: Dg Ugi W/water Sol Cm  02/27/2016  CLINICAL DATA:  Exploratory laparotomy with patch of duodenum ulcer on 02/22/2016 EXAM: WATER SOLUBLE UPPER GI SERIES TECHNIQUE: Single-column upper GI series was performed using water soluble contrast. COMPARISON:  CT abdomen pelvis dated 02/22/2016 FLUOROSCOPY TIME:  Radiation Exposure Index (as provided by the fluoroscopic device): 124.4 mGy FINDINGS: Nasogastric tube in the stomach. Patient drank water-soluble contrast orally  via straw. Normal gastric folds. Irregular outpouching of contrast along the duodenal bulb in the region of the patient's known duodenal ulcer status post repair (for example, series 12-13). However, this contrast did not freely spill into the peritoneal cavity and also did not drain along the patient's surgical drain. Contrast passed into the distal duodenum and proximal jejunum. IMPRESSION: No evidence of leak. Irregular outpouching of contrast along the duodenal bulb in the region of the patient's known duodenal ulcer. No spill into the peritoneal cavity. Electronically Signed   By: Julian Hy M.D.   On: 02/27/2016 15:43    Anti-infectives: Anti-infectives    Start     Dose/Rate Route Frequency Ordered Stop   02/25/16 1400  piperacillin-tazobactam (ZOSYN) IVPB 3.375 g     3.375 g 12.5 mL/hr over 240 Minutes Intravenous 3 times per day 02/25/16 1231     02/25/16 1100  metroNIDAZOLE (FLAGYL) IVPB 500 mg     500 mg 100 mL/hr over 60 Minutes Intravenous Every 8 hours 02/25/16 1034     02/25/16 1100  ciprofloxacin (CIPRO) IVPB 400 mg  Status:  Discontinued     400 mg 200 mL/hr over 60 Minutes Intravenous Every 12 hours 02/25/16 1034 02/25/16 1210   02/23/16 0000  piperacillin-tazobactam (ZOSYN) IVPB 3.375 g  Status:  Discontinued    Comments:  Zosyn 3.375 g IV q8h for CrCl > 20 mL/min   3.375 g 12.5 mL/hr over 240 Minutes Intravenous 3 times per day 02/22/16 2351 02/25/16 1034   02/22/16 2245  metroNIDAZOLE (  FLAGYL) IVPB 500 mg  Status:  Discontinued     500 mg 100 mL/hr over 60 Minutes Intravenous 3 times per day 02/22/16 2230 02/24/16 1030   02/22/16 2230  metroNIDAZOLE (FLAGYL) IVPB 500 mg  Status:  Discontinued     500 mg 100 mL/hr over 60 Minutes Intravenous Every 8 hours 02/22/16 2229 02/22/16 2230   02/22/16 1630  clindamycin (CLEOCIN) IVPB 600 mg    Comments:  Pharmacy may adjust dosing strength, interval, or rate of medication as needed for optimal therapy for the  patient Send with patient on call to the OR.  Anesthesia to complete antibiotic administration <74min prior to incision per [facility].   600 mg 100 mL/hr over 30 Minutes Intravenous On call to O.R. 02/22/16 1617 02/22/16 1915   02/19/16 0430  vancomycin (VANCOCIN) 50 mg/mL oral solution 125 mg  Status:  Discontinued     125 mg Oral 4 times daily 02/19/16 0417 02/25/16 1034      Assessment/Plan: s/p Procedure(s): EXPLORATORY LAPAROTOMY WITH  PATCH OF DUODENAL ULCER (N/A) Continue clear liquids for now  Leave drain in place Encourage ambulation Continue antibiotics VAC change per nursing   LOS: 11 days    [name] K. 02/29/2016

## 2016-02-29 NOTE — Care Management Important Message (Signed)
Important Message  Patient Details  Name: [name] MRN: [id number] Date of Birth: June 15, 1946   Medicare Important Message Given:  Yes    Zenon Mayo, RN 02/29/2016, 1:31 PMImportant Message  Patient Details  Name: [name] MRN: [id number] Date of Birth: [birth year]   Medicare Important Message Given:  Yes    Zenon Mayo, RN 02/29/2016, 1:30 PM

## 2016-02-29 NOTE — Care Management Note (Signed)
Case Management Note  Patient Details  Name: [name] MRN: [id number] Date of Birth: Sep 28, 1946  Subjective/Objective:     Patient has spoken with CSW about SNF and he is agreeable but would like to speak with wife about it first,  Patient is also a candidate for LTAC.  Per [facility] rep.  Rep will check to see if insurance will cover, NCM will speak with patient and his wife about LTAC if this is covered by his insurance. Patient conts on TPN, has jp drain, on clears, has wound vac in place, on heparin drip for bil DVT's, 7 pod expl lap for perf duodenal ulcer.                Action/Plan:   Expected Discharge Date:  02/21/16               Expected Discharge Plan:  Skilled Nursing Facility  In-House Referral:  Clinical Social Work  Discharge planning Services  CM Consult  Post Acute Care Choice:    Choice offered to:     DME Arranged:    DME Agency:     HH Arranged:    [place] Agency:     Status of Service:  In process, will continue to follow  Medicare Important Message Given:  Yes Date Medicare IM Given:    Medicare IM give by:    Date Additional Medicare IM Given:    Additional Medicare Important Message give by:     If discussed at [place] of Stay Meetings, dates discussed:    Additional Comments:  Zenon Mayo, RN 02/29/2016, 6:53 PM

## 2016-02-29 NOTE — Progress Notes (Signed)
ANTICOAGULATION CONSULT NOTE - Follow Up Consult  Pharmacy Consult for Heparin Indication: new bilateral DVT, hx of afib (Coumadin on hold)  Allergies  Allergen Reactions  . Keflex [Cephalexin] Shortness Of Breath and Other (See Comments)    dizziness     Patient Measurements: Height: 5\' 10"  (177.8 cm) Weight: 216 lb 11.4 oz (98.3 kg) IBW/kg (Calculated) : 73 Heparin Dosing Weight: 93 kg  Vital Signs: Temp: 98.6 F (37 C) (03/13 1200) Temp Source: Oral (03/13 1200) BP: 120/70 mmHg (03/13 1201) Pulse Rate: 92 (03/13 1201)  Labs:  Recent Labs  02/27/16 0500  02/28/16 0504 02/28/16 0505 02/29/16 0509 02/29/16 0510 02/29/16 1417  HGB 9.3*  --  8.5*  --  9.9*  --   --   HCT 29.2*  --  26.8*  --  32.0*  --   --   PLT 314  --  337  --  334  --   --   LABPROT 16.8*  --   --  17.8*  --  18.0*  --   INR 1.35  --   --  1.46  --  1.48  --   HEPARINUNFRC 0.57  < >  --  0.63  --  0.72* 0.46  CREATININE 0.94  --  1.04  --  1.15  --   --   < > = values in this interval not displayed.  Estimated Creatinine Clearance: 72.3 mL/min (by C-G formula based on Cr of 1.15).  Assessment: 69 yo M with hx afib on warf 5mg  daily exc 2.5mg  on MWF with INR 3.28 on admit. Warfarin held for GIB + surgery for perforated duodenal ulcer. S/p OR for laparotomy.  New bilateral LE DVTs on dopplers - start heparin IV on 3/8.  Ultimately plan to bridge back to warfarin. INR today is 1.48. Last HL was therapeutic at 0.46 after rate decrease. HL weas borderline therapeutic before. Hgb low at 9.42m plts wnl. No s/s of bleed.  Goal of Therapy:  Heparin level 0.3-0.7 units/ml Monitor platelets by anticoagulation protocol: Yes   Plan:  Continue heparin gtt at 2,100 units/hr Monitor daily HL, CBC, s/s of bleed F/U bridge back to Coumadin or other agent  Elenor Quinones, PharmD, BCPS Clinical Pharmacist Pager [phone] 02/29/2016 3:29 PM

## 2016-03-01 DIAGNOSIS — K264 Chronic or unspecified duodenal ulcer with hemorrhage: Secondary | ICD-10-CM

## 2016-03-01 LAB — COMPREHENSIVE METABOLIC PANEL
ALT: 16 U/L — AB (ref 17–63)
ANION GAP: 11 (ref 5–15)
AST: 29 U/L (ref 15–41)
Albumin: 1.9 g/dL — ABNORMAL LOW (ref 3.5–5.0)
Alkaline Phosphatase: 32 U/L — ABNORMAL LOW (ref 38–126)
BUN: 21 mg/dL — ABNORMAL HIGH (ref 6–20)
CHLORIDE: 104 mmol/L (ref 101–111)
CO2: 25 mmol/L (ref 22–32)
CREATININE: 1.11 mg/dL (ref 0.61–1.24)
Calcium: 8.5 mg/dL — ABNORMAL LOW (ref 8.9–10.3)
Glucose, Bld: 108 mg/dL — ABNORMAL HIGH (ref 65–99)
POTASSIUM: 3.6 mmol/L (ref 3.5–5.1)
SODIUM: 140 mmol/L (ref 135–145)
Total Bilirubin: 0.4 mg/dL (ref 0.3–1.2)
Total Protein: 5 g/dL — ABNORMAL LOW (ref 6.5–8.1)

## 2016-03-01 LAB — GLUCOSE, CAPILLARY
GLUCOSE-CAPILLARY: 100 mg/dL — AB (ref 65–99)
Glucose-Capillary: 119 mg/dL — ABNORMAL HIGH (ref 65–99)
Glucose-Capillary: 117 mg/dL — ABNORMAL HIGH (ref 65–99)

## 2016-03-01 LAB — PROTIME-INR
INR: 1.57 — AB (ref 0.00–1.49)
PROTHROMBIN TIME: 18.8 s — AB (ref 11.6–15.2)

## 2016-03-01 LAB — MAGNESIUM: MAGNESIUM: 1.9 mg/dL (ref 1.7–2.4)

## 2016-03-01 LAB — HEPARIN LEVEL (UNFRACTIONATED): HEPARIN UNFRACTIONATED: 0.64 [IU]/mL (ref 0.30–0.70)

## 2016-03-01 MED ORDER — FAT EMULSION 20 % IV EMUL
240.0000 mL | INTRAVENOUS | Status: DC
  Administered 2016-03-01: 240 mL via INTRAVENOUS
  Filled 2016-03-01: qty 250

## 2016-03-01 MED ORDER — POTASSIUM CHLORIDE 10 MEQ/50ML IV SOLN
10.0000 meq | INTRAVENOUS | Status: AC
  Administered 2016-03-01 (×4): 10 meq via INTRAVENOUS
  Filled 2016-03-01 (×3): qty 50

## 2016-03-01 MED ORDER — METOPROLOL TARTRATE 12.5 MG HALF TABLET
12.5000 mg | ORAL_TABLET | Freq: Two times a day (BID) | ORAL | Status: DC
  Administered 2016-03-01 – 2016-03-02 (×2): 12.5 mg via ORAL
  Filled 2016-03-01 (×2): qty 1

## 2016-03-01 MED ORDER — TRACE MINERALS CR-CU-MN-SE-ZN 10-1000-500-60 MCG/ML IV SOLN
INTRAVENOUS | Status: DC
  Administered 2016-03-01: 18:00:00 via INTRAVENOUS
  Filled 2016-03-01: qty 1920

## 2016-03-01 MED ORDER — FUROSEMIDE 20 MG PO TABS
20.0000 mg | ORAL_TABLET | Freq: Every day | ORAL | Status: DC
  Administered 2016-03-01 – 2016-03-02 (×2): 20 mg via ORAL
  Filled 2016-03-01 (×2): qty 1

## 2016-03-01 NOTE — Care Management Note (Signed)
Case Management Note  Patient Details  Name: [name] MRN: [id number] Date of Birth: 10-05-1946  Subjective/Objective:    NCM left message with patient's wife, [name].  Per patient ,he wanted me to tell her about [facility].  NCM left information about LTAC for wife and my phone number if she wants to call me back. NCM informed patient and wife that his insurance may not approve LTAC , in this case he will need to go to SNF, also if he is medically ready and we still have not heard from insurance company he will need to go to a SNF, Aaron Edelman with [facility] [phone] will let NCM know if he receives authorization for LTAC.                    Action/Plan:   Expected Discharge Date:  02/21/16               Expected Discharge Plan:  Skilled Nursing Facility  In-House Referral:  Clinical Social Work  Discharge planning Services  CM Consult  Post Acute Care Choice:    Choice offered to:     DME Arranged:    DME Agency:     HH Arranged:    [place] Agency:     Status of Service:  Completed, signed off  Medicare Important Message Given:  Yes Date Medicare IM Given:    Medicare IM give by:    Date Additional Medicare IM Given:    Additional Medicare Important Message give by:     If discussed at [place] of Stay Meetings, dates discussed:    Additional Comments:  Zenon Mayo, RN 03/01/2016, 4:53 PM

## 2016-03-01 NOTE — Progress Notes (Addendum)
PARENTERAL NUTRITION CONSULT NOTE - FOLLOW UP  Pharmacy Consult for TPN Indication: Prolonged ileus  Allergies  Allergen Reactions  . Keflex [Cephalexin] Shortness Of Breath and Other (See Comments)    dizziness    Patient Measurements: Height: 5\' 10"  (177.8 cm) Weight: 225 lb 1.4 oz (102.1 kg) IBW/kg (Calculated) : 73 Adjusted Body Weight:  Usual Weight:   Vital Signs: Temp: 98.6 F (37 C) (03/14 0751) Temp Source: Oral (03/14 0751) BP: 107/71 mmHg (03/14 0751) Pulse Rate: 102 (03/14 0751) Intake/Output from previous day: 03/13 0701 - 03/14 0700 In: 3412.8 [P.O.:20; I.V.:492.8; IV Piggyback:650; TPN:2250] Out: 1451 [Urine:1450; Stool:1] Intake/Output from this shift: Total I/O In: -  Out: 301 [Urine:300; Stool:1]  Labs:  Recent Labs  02/28/16 0504 02/28/16 0505 02/29/16 0509 02/29/16 0510 03/01/16 0500  WBC 12.4*  --  10.8*  --   --   HGB 8.5*  --  9.9*  --   --   HCT 26.8*  --  32.0*  --   --   PLT 337  --  334  --   --   INR  --  1.46  --  1.48 1.57*     Recent Labs  02/28/16 0504 02/28/16 1034 02/29/16 0509 03/01/16 0340  NA 139  --  139 140  K 3.3*  --  3.6 3.6  CL 107  --  106 104  CO2 26  --  25 25  GLUCOSE 125*  --  117* 108*  BUN 20  --  20 21*  CREATININE 1.04  --  1.15 1.11  CALCIUM 8.3*  --  8.4* 8.5*  MG 1.8  --  2.0 1.9  PHOS  --  5.7* 3.5  --   PROT 5.2*  --  5.1* 5.0*  ALBUMIN 1.9*  --  1.9* 1.9*  AST 20  --  26 29  ALT 12*  --  16* 16*  ALKPHOS 30*  --  32* 32*  BILITOT 0.5  --  0.3 0.4  PREALBUMIN  --   --  11.7*  --   TRIG  --   --  53  --    Estimated Creatinine Clearance: 76.2 mL/min (by C-G formula based on Cr of 1.11).    Recent Labs  02/29/16 1710 02/29/16 2304 03/01/16 0508  GLUCAP 109* 108* 119*    Medications:  Scheduled:  . digoxin  0.125 mg Intravenous Daily  . furosemide  20 mg Intravenous Daily  . insulin aspart  0-9 Units Subcutaneous 3 times per day  . metoprolol  5 mg Intravenous Q4H  .  metronidazole  500 mg Intravenous Q8H  . pantoprazole (PROTONIX) IV  80 mg Intravenous Q12H  . piperacillin-tazobactam (ZOSYN)  IV  3.375 g Intravenous 3 times per day  . sodium chloride flush  10-40 mL Intracatheter Q12H    Insulin Requirements in the past 24 hours:  0 units SSI yesterday  Assessment: 13 YOM presented on 02/18/16 with hypotension and dizziness from GIB. Found to have a perforated duodenum and was taken to the OR on 02/22/16 for ex-lap with Phillip Heal patch of duodenal ulcer. Pharmacy consulted to initiate TPN for nutritional support given expected prolonged bowel rest.  GI: admitted with GIB on high dose PPI IV BID, baseline prealbumin low at 8.3. NGT d/c'd 3/12 & CLD ordered. Nauseated Prealbumin up to 11.7. +BM 3/13  Endo: TSH WNL. No hx DM - CBGs controlled (93-149) with minimal SSI needs   Lytes: K+ 3.6 (goal >/=  4 for ileus), Mg 2, Phos 3.5.  Renal: CKD3, s/p recent TURP (2/6) - SCr 1.15 up slightly , BUN WNL. UOP 0.6 ml/kg/hr, NS at 10 ml/hr. I/O +1.9 L yesterday (+10 L total)  Pulm: 2L  Cards: CAD / ICM / AFib / DL / AICD / CHF - BP wnl, tachy (in Afib, & anemic). No active CHF & CXR clear - Digoxin IV, Lopressor IV, Lasix 20mg  IV daily  AC: Coumadin PTA for hx AFib >> held d/t GIB. New DVT on 3/7 > heparin gtt started 3/8.  Hepatobil: LFTs WNL. TG WNL 53  Neuro: hx depression   Heme: Anemia 2nd GIB, PRBC x 2 on 3/9 with Hg improved to 9.9,  ID: Flagyl IV for C.diff colitis + Zosyn day #8 for intra-abd infxn, long course of Cipro PTA - afebrile, WBC 12.2>10.8  Best Practices: heparin gtt, IV PPI  TPN Access: right IJ placed 02/22/16, PICC placed 02/24/16  TPN start date: 02/24/16  Current Nutrition: TPN  Nutritional Goals:  2000-2200 kCal, 120-130 grams of protein per day (goal 118ml/hr) per RD 3/8  Plan:  - Clinimix E 5/15 at 80 ml/hr, 96g protein, continue IV Lipids 20% at 10 ml/hr, this will provide 1843 kcal and 96 g AA - F/u plans to wean  TPN, paged NP without response - KCl 10 mEq x4 - Daily multivitamin, folate and trace elements in TPN - Change SSI to tid - TPN labs qMon/Thur    Hughes Better, PharmD, BCPS Clinical Pharmacist 03/01/2016 8:35 AM

## 2016-03-01 NOTE — Progress Notes (Signed)
[place] TEAM 1 - Stepdown/ICU TEAM Progress Note  [name] [id number] DOB: 1946/07/12 DOA: 02/18/2016 PCP: [name], MD  Admit HPI / Brief Narrative: 70 y.o. WM PMHx Depression, CAD native artery, MI, Ischemic Cardiomyopathy with cardiac pacemaker, Persistent Atrial Fibrillation on Coumadin, HLD, Chronic Renal Insufficiency,  Patient presenting today for complaints of hypotension and dizziness. Patient states that he's had various somatic complaints ever since he had a TURP procedure performed on 01/25/2016. Of note he complains of intermittent abdominal cramping, bloating, constipation. Over the last 24 hours he is experienced extreme fatigue, dizziness upon standing and shortness of breath or dyspnea on exertion. Patient has been taking ciprofloxacin ever since his surgery. Patient also states that his blood pressure medications were changed 2 weeks ago due to hypotension. His dose of carvedilol was decreased and his losartan was stopped. Patient is also had some mild nausea without vomiting but denies chest pain, palpitations, neck stiffness, headache, LOC, head trauma. Patient has taken BP at home and states systolic as low as [id number].   HPI/Subjective: 3/14 A/O 4, abdomen nontender,On dysphagia 1 diet. Negative N/V, negative abdominal pain postprandial. States had BM today.      Assessment/Plan:  Acute respiratory failure with hypoxia. -Oxygen to keep SpO2 > 92% -DuoNeb QID  Hypotension 2nd to acute blood loss from GI bleed -Resolved.  Chronic systolic CHF s/p AICD  -LVEF 20 to 25% from Echo 01/06/16. -Metoprolol 12.5 mg BID -Lasix 20 mg daily  A Fib with RVR,/ CAD,  -Rate Improved but not well-controlled, may partially be secondary to pain.  -Digoxin 0.125 mg daily  -See systolic CHF  HLD. -Lipid panel W/I NCEP guidelines  B/l lower extremity DVT  -Continue heparin drip -Prior to discharge will need to make decision on anticoagulant  CKD stage  3. -Resolved Cr WNL  Urinary retention S/p TURP 01/25/16.  Acute GI bleed from perforated duodenal ulcer  -s/p Phillip Heal patch 3/06. -Protonix  gtt per surgery -TNA/nutrition, wound care per surgery  Acute blood loss Anemia/ Critical illness anemia  -Transfuse for Hb < 7 -SCD's  Positive C diff colitis  - not certain if he is absorbing enteral vancomycin. -Peritonitis 2nd to perforated duodenal ulcer -Continue Flagyl  Hyperglycemia. -Sensitive SSI   Hypophosphatemia -WNL  Hypokalemia. -Potassium goal > 4   Hypomagnesemia -Magnesium goal> 2    Code Status: FULL Family Communication: None present at time of exam Disposition Plan: Per surgery    Consultants: Dr.Haywood Dalbert [facility] Dr.David Loren R[facility] Dr.[facility]  [facility] M    Procedure/Significant Events: 3/06 CT chest >> no PE 3/06 CT abd/pelvis >> pneumoperitoneum 3/06 Pneumoperitoneum on CT abd >> Surgery consulted; cardiology consulted 3/6 graham patch perforated duodenal ulcer,19 Fr blake drain near patch 3/07 Doppler legs b/l >> DVT Rt soleal vein, DVT Lt peroneal vein 3/08 transfuse PRBC 3/11 UGI water-soluble contrast;Irregular outpouching of contrast along the duodenal bulb in the region of the patient's known duodenal ulcer. No spill into the peritoneal cavity.  Culture 3/02 C diff PCR >> positive  Antibiotics: 3/02 Enteral vancomycin >> 3/09 3/06 Zosyn >> 3/09 3/09 Cipro 1 dose 3/06 Flagyl >>    DVT prophylaxis: Heparin drip    Devices    LINES / TUBES:  Abdominal wound VAC in place    Continuous Infusions: . Marland KitchenTPN (CLINIMIX-E) Adult 80 mL/hr at 02/29/16 1734   And  . fat emulsion 240 mL (02/29/16 1734)  . Marland KitchenTPN (CLINIMIX-E) Adult     And  .  fat emulsion    . heparin 2,100 Units/hr (03/01/16 0700)    Objective: VITAL SIGNS: Temp: 98.3 F (36.8 C) (03/14 1500) Temp Source: Oral (03/14 1500) BP: 112/67 mmHg (03/14 1500) Pulse Rate: 93 (03/14  1500) SPO2; FIO2:   Intake/Output Summary (Last 24 hours) at 03/01/16 1721 Last data filed at 03/01/16 1600  Gross per 24 hour  Intake   2827 ml  Output   2202 ml  Net    625 ml     Exam: General: A/O 4, abdomen appropriately tender for post surgery., No acute respiratory distress Eyes: Negative headache, negative scleral hemorrhage ENT: Negative Runny nose, negative gingival bleeding, Neck:  Negative scars, masses, torticollis, lymphadenopathy, JVD Lungs: Clear to auscultation bilaterally without wheezes or crackles Cardiovascular: Irregular irregular rhythm and rate ,without murmur gallop or rub normal S1 and S2 Abdomen: Negative abdominal pain, midline incision covered with dressing (did not remove just placed), appropriately tender, nondistended, positive soft, bowel sounds, no rebound, no ascites, no appreciable mass Extremities: No significant cyanosis, clubbing, or edema bilateral lower extremities Psychiatric:  Negative depression, negative anxiety, negative fatigue, negative mania  Neurologic:  Cranial nerves II through XII intact, tongue/uvula midline, all extremities muscle strength 5/5, sensation intact throughout,  negative dysarthria, negative expressive aphasia, negative receptive aphasia.   Data Reviewed: Basic Metabolic Panel:  Recent Labs Lab 02/25/16 0206 02/26/16 0532 02/27/16 0500 02/28/16 0504 02/28/16 1034 02/29/16 0509 03/01/16 0340  NA 143 142 141 139  --  139 140  K 3.4* 3.5 3.4* 3.3*  --  3.6 3.6  CL 107 109 105 107  --  106 104  CO2 24 23 26 26   --  25 25  GLUCOSE 141* 130* 119* 125*  --  117* 108*  BUN 17 16 17 20   --  20 21*  CREATININE 1.40* 1.07 0.94 1.04  --  1.15 1.11  CALCIUM 8.5* 8.2* 8.5* 8.3*  --  8.4* 8.5*  MG 2.1  --   --  1.8  --  2.0 1.9  PHOS 2.6 2.3* 2.7  --  5.7* 3.5  --    Liver Function Tests:  Recent Labs Lab 02/25/16 0206 02/27/16 0500 02/28/16 0504 02/29/16 0509 03/01/16 0340  AST 18 16 20 26 29   ALT 16*  11* 12* 16* 16*  ALKPHOS 35* 32* 30* 32* 32*  BILITOT 0.6 0.4 0.5 0.3 0.4  PROT 5.6* 5.3* 5.2* 5.1* 5.0*  ALBUMIN 2.1* 2.1* 1.9* 1.9* 1.9*   No results for input(s): LIPASE, AMYLASE in the last 168 hours. No results for input(s): AMMONIA in the last 168 hours. CBC:  Recent Labs Lab 02/25/16 0206 02/26/16 0532 02/27/16 0500 02/28/16 0504 02/29/16 0509  WBC 13.2* 11.3* 12.2* 12.4* 10.8*  NEUTROABS 10.9*  --  8.2* 8.2* 7.5  HGB 9.3* 9.1* 9.3* 8.5* 9.9*  HCT 27.2* 28.9* 29.2* 26.8* 32.0*  MCV 81.4 81.9 81.6 81.0 82.7  PLT 243 280 314 337 334   Cardiac Enzymes: No results for input(s): CKTOTAL, CKMB, CKMBINDEX, TROPONINI in the last 168 hours. BNP (last 3 results)  Recent Labs  02/18/16 0940  BNP 156.7*    ProBNP (last 3 results) No results for input(s): PROBNP in the last 8760 hours.  CBG:  Recent Labs Lab 02/29/16 1201 02/29/16 1710 02/29/16 2304 03/01/16 0508 03/01/16 1204  GLUCAP 107* 109* 108* 119* 117*    Recent Results (from the past 240 hour(s))  Surgical pcr screen     Status: None   Collection Time:  02/22/16  4:27 PM  Result Value Ref Range Status   MRSA, PCR NEGATIVE NEGATIVE Final   Staphylococcus aureus NEGATIVE NEGATIVE Final    Comment:        The Xpert SA Assay (FDA approved for NASAL specimens in patients over 75 years of age), is one component of a comprehensive surveillance program.  Test performance has been validated by [facility] for patients greater than or equal to 70 year old. It is not intended to diagnose infection nor to guide or monitor treatment.      Studies:  Recent x-ray studies have been reviewed in detail by the Attending Physician  Scheduled Meds:  Scheduled Meds: . digoxin  0.125 mg Intravenous Daily  . furosemide  20 mg Intravenous Daily  . insulin aspart  0-9 Units Subcutaneous 3 times per day  . metoprolol  5 mg Intravenous Q4H  . metronidazole  500 mg Intravenous Q8H  . pantoprazole (PROTONIX) IV   80 mg Intravenous Q12H  . sodium chloride flush  10-40 mL Intracatheter Q12H    Time spent on care of this patient: 40 mins   WOODS, Geraldo Docker , MD  [facility] Office  [phone] Pager - [phone]  On-Call/Text Page:      [email or web address]      password [email or web address]  If 7PM-7AM, please contact night-coverage [email or web address] Password [email or web address] 03/01/2016, 5:21 PM   LOS: 12 days   Care during the described time interval was provided by me .  I have reviewed this patient's available data, including medical history, events of note, physical examination, and all test results as part of my evaluation. I have personally reviewed and interpreted all radiology studies.   Dia Crawford, MD [phone] Pager

## 2016-03-01 NOTE — Progress Notes (Signed)
Subjective:  No CP/SOB  Objective:  Temp:  [97.9 F (36.6 C)-98.7 F (37.1 C)] 98.6 F (37 C) (03/14 0751) Pulse Rate:  [85-110] 102 (03/14 0751) Resp:  [18-28] 24 (03/14 0751) BP: (107-144)/(55-71) 107/71 mmHg (03/14 0751) SpO2:  [97 %-100 %] 97 % (03/14 0751) Weight:  [225 lb 1.4 oz (102.1 kg)] 225 lb 1.4 oz (102.1 kg) (03/14 0350) Weight change:   Intake/Output from previous day: 03/13 0701 - 03/14 0700 In: 3433.8 [P.O.:20; I.V.:513.8; IV Piggyback:650; TPN:2250] Out: 1451 [Urine:1450; Stool:1]  Intake/Output from this shift: Total I/O In: 644 [I.V.:84; IV Piggyback:200; TPN:360] Out: 602 [Urine:600; Stool:2]  Physical Exam: General appearance: alert and no distress Neck: no adenopathy, no carotid bruit, no JVD, supple, symmetrical, trachea midline and thyroid not enlarged, symmetric, no tenderness/mass/nodules Lungs: clear to auscultation bilaterally Heart: regular rate and rhythm Extremities: extremities normal, atraumatic, no cyanosis or edema  Lab Results: Results for orders placed or performed during the hospital encounter of 02/18/16 (from the past 48 hour(s))  Glucose, capillary     Status: Abnormal   Collection Time: 02/28/16  7:06 PM  Result Value Ref Range   Glucose-Capillary 149 (H) 65 - 99 mg/dL  Comprehensive metabolic panel     Status: Abnormal   Collection Time: 02/29/16  5:09 AM  Result Value Ref Range   Sodium 139 135 - 145 mmol/L   Potassium 3.6 3.5 - 5.1 mmol/L   Chloride 106 101 - 111 mmol/L   CO2 25 22 - 32 mmol/L   Glucose, Bld 117 (H) 65 - 99 mg/dL   BUN 20 6 - 20 mg/dL   Creatinine, Ser 1.15 0.61 - 1.24 mg/dL   Calcium 8.4 (L) 8.9 - 10.3 mg/dL   Total Protein 5.1 (L) 6.5 - 8.1 g/dL   Albumin 1.9 (L) 3.5 - 5.0 g/dL   AST 26 15 - 41 U/L   ALT 16 (L) 17 - 63 U/L   Alkaline Phosphatase 32 (L) 38 - 126 U/L   Total Bilirubin 0.3 0.3 - 1.2 mg/dL   GFR calc non Af Amer >60 >60 mL/min   GFR calc Af Amer >60 >60 mL/min    Comment:  (NOTE) The eGFR has been calculated using the CKD EPI equation. This calculation has not been validated in all clinical situations. eGFR's persistently <60 mL/min signify possible Chronic Kidney Disease.    Anion gap 8 5 - 15  Magnesium     Status: None   Collection Time: 02/29/16  5:09 AM  Result Value Ref Range   Magnesium 2.0 1.7 - 2.4 mg/dL  Phosphorus     Status: None   Collection Time: 02/29/16  5:09 AM  Result Value Ref Range   Phosphorus 3.5 2.5 - 4.6 mg/dL  Prealbumin     Status: Abnormal   Collection Time: 02/29/16  5:09 AM  Result Value Ref Range   Prealbumin 11.7 (L) 18 - 38 mg/dL  CBC with Differential/Platelet     Status: Abnormal   Collection Time: 02/29/16  5:09 AM  Result Value Ref Range   WBC 10.8 (H) 4.0 - 10.5 K/uL   RBC 3.87 (L) 4.22 - 5.81 MIL/uL   Hemoglobin 9.9 (L) 13.0 - 17.0 g/dL   HCT 32.0 (L) 39.0 - 52.0 %   MCV 82.7 78.0 - 100.0 fL   MCH 25.6 (L) 26.0 - 34.0 pg   MCHC 30.9 30.0 - 36.0 g/dL   RDW 16.5 (H) 11.5 - 15.5 %   Platelets  334 150 - 400 K/uL   Neutrophils Relative % 70 %   Neutro Abs 7.5 1.7 - 7.7 K/uL   Lymphocytes Relative 18 %   Lymphs Abs 2.0 0.7 - 4.0 K/uL   Monocytes Relative 8 %   Monocytes Absolute 0.9 0.1 - 1.0 K/uL   Eosinophils Relative 4 %   Eosinophils Absolute 0.4 0.0 - 0.7 K/uL   Basophils Relative 0 %   Basophils Absolute 0.0 0.0 - 0.1 K/uL  Triglycerides     Status: None   Collection Time: 02/29/16  5:09 AM  Result Value Ref Range   Triglycerides 53 <150 mg/dL  Heparin level (unfractionated)     Status: Abnormal   Collection Time: 02/29/16  5:10 AM  Result Value Ref Range   Heparin Unfractionated 0.72 (H) 0.30 - 0.70 IU/mL    Comment:        IF HEPARIN RESULTS ARE BELOW EXPECTED VALUES, AND PATIENT DOSAGE HAS BEEN CONFIRMED, SUGGEST FOLLOW UP TESTING OF ANTITHROMBIN III LEVELS.   Protime-INR     Status: Abnormal   Collection Time: 02/29/16  5:10 AM  Result Value Ref Range   Prothrombin Time 18.0 (H) 11.6 -  15.2 seconds   INR 1.48 0.00 - 1.49  Glucose, capillary     Status: Abnormal   Collection Time: 02/29/16  5:20 AM  Result Value Ref Range   Glucose-Capillary 104 (H) 65 - 99 mg/dL  Glucose, capillary     Status: None   Collection Time: 02/29/16  7:52 AM  Result Value Ref Range   Glucose-Capillary 93 65 - 99 mg/dL  Glucose, capillary     Status: Abnormal   Collection Time: 02/29/16 12:01 PM  Result Value Ref Range   Glucose-Capillary 107 (H) 65 - 99 mg/dL  Heparin level (unfractionated)     Status: None   Collection Time: 02/29/16  2:17 PM  Result Value Ref Range   Heparin Unfractionated 0.46 0.30 - 0.70 IU/mL    Comment:        IF HEPARIN RESULTS ARE BELOW EXPECTED VALUES, AND PATIENT DOSAGE HAS BEEN CONFIRMED, SUGGEST FOLLOW UP TESTING OF ANTITHROMBIN III LEVELS.   Glucose, capillary     Status: Abnormal   Collection Time: 02/29/16  5:10 PM  Result Value Ref Range   Glucose-Capillary 109 (H) 65 - 99 mg/dL  Glucose, capillary     Status: Abnormal   Collection Time: 02/29/16 11:04 PM  Result Value Ref Range   Glucose-Capillary 108 (H) 65 - 99 mg/dL  Comprehensive metabolic panel     Status: Abnormal   Collection Time: 03/01/16  3:40 AM  Result Value Ref Range   Sodium 140 135 - 145 mmol/L   Potassium 3.6 3.5 - 5.1 mmol/L   Chloride 104 101 - 111 mmol/L   CO2 25 22 - 32 mmol/L   Glucose, Bld 108 (H) 65 - 99 mg/dL   BUN 21 (H) 6 - 20 mg/dL   Creatinine, Ser 1.11 0.61 - 1.24 mg/dL   Calcium 8.5 (L) 8.9 - 10.3 mg/dL   Total Protein 5.0 (L) 6.5 - 8.1 g/dL   Albumin 1.9 (L) 3.5 - 5.0 g/dL   AST 29 15 - 41 U/L   ALT 16 (L) 17 - 63 U/L   Alkaline Phosphatase 32 (L) 38 - 126 U/L   Total Bilirubin 0.4 0.3 - 1.2 mg/dL   GFR calc non Af Amer >60 >60 mL/min   GFR calc Af Amer >60 >60 mL/min    Comment: (  NOTE) The eGFR has been calculated using the CKD EPI equation. This calculation has not been validated in all clinical situations. eGFR's persistently <60 mL/min signify  possible Chronic Kidney Disease.    Anion gap 11 5 - 15  Magnesium     Status: None   Collection Time: 03/01/16  3:40 AM  Result Value Ref Range   Magnesium 1.9 1.7 - 2.4 mg/dL  Heparin level (unfractionated)     Status: None   Collection Time: 03/01/16  5:00 AM  Result Value Ref Range   Heparin Unfractionated 0.64 0.30 - 0.70 IU/mL    Comment:        IF HEPARIN RESULTS ARE BELOW EXPECTED VALUES, AND PATIENT DOSAGE HAS BEEN CONFIRMED, SUGGEST FOLLOW UP TESTING OF ANTITHROMBIN III LEVELS.   Protime-INR     Status: Abnormal   Collection Time: 03/01/16  5:00 AM  Result Value Ref Range   Prothrombin Time 18.8 (H) 11.6 - 15.2 seconds   INR 1.57 (H) 0.00 - 1.49  Glucose, capillary     Status: Abnormal   Collection Time: 03/01/16  5:08 AM  Result Value Ref Range   Glucose-Capillary 119 (H) 65 - 99 mg/dL    Imaging: Imaging results have been reviewed  Tele- Paced rhythm  Assessment/Plan:   1. Principal Problem: 2.   Symptomatic anemia 3. Active Problems: 4.   CAD S/P LAD PCI- 2000 5.   Chronic systolic CHF (congestive heart failure) ([place]) 6.   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   BPH- s/p TURP 01/25/16 - followed by 4 weeks of Cipro 8.   GI bleed 9.   Hypotension 10.   Chronic a-fib ([place]) 11.   S/P TURP 12.   CKD (chronic kidney disease) stage 3, GFR 30-59 ml/min 13.   Supratherapeutic INR 14.   Cardiomyopathy, ischemic-EF 20-25% 15.   Chronic anticoagulation-Couamdin 16.   Epigastric pain 17.   C. difficile colitis 18.   Acute respiratory failure with hypoxia (HCC) 19.   Perforated ulcer ([place]) 20.   Duodenal ulcer with perforation ([place]) 21.   Abdominal pain 22.   Bleeding gastrointestinal 23.   Chronic kidney disease, stage III (moderate) 24.   Urinary retention 25.   Acute GI bleeding 26.   Hyperglycemia 27.   Hypophosphatemia 28.   Hypokalemia 29.   AICD (automatic cardioverter/defibrillator) present 30.   Atrial fibrillation  with RVR (HCC) 31.   HLD (hyperlipidemia) 32.   Perforated duodenal ulcer ([place]) 33.   Hypomagnesemia 34.   Time Spent Directly with Patient:  15 minutes  Length of Stay:  LOS: 12 days   On IV hep. Hgb stable. S/P surg for perf DU. H/O ISCM with EF 20%. No sihns of CHF. Getting clear liqs, adv to soft per Surg. Has soleil and peroneal vein DVT. Restart on home meds including coumadin AC when able to eat and when OK with surg. We will S/O but are avail for any further questions.   Quay Burow 03/01/2016, 12:01 PM

## 2016-03-01 NOTE — Progress Notes (Signed)
Patient ID: [name], male   DOB: 1946-01-23, 70 y.o.   MRN: [id number]     [facility]      [address] [place], [place] [address]    Phone: [phone] FAX: [phone]     Subjective: Nausea has resolved.  BM yesterday and today.  Tolerating clears. Does not want fulls.  Walking in room.   VAC leaking.   Objective:  Vital signs:  Filed Vitals:   02/29/16 2303 03/01/16 0344 03/01/16 0350 03/01/16 0751  BP: 112/63  120/61 107/71  Pulse: 85  96 102  Temp: 97.9 F (36.6 C) 98.2 F (36.8 C)  98.6 F (37 C)  TempSrc: Oral Oral  Oral  Resp: '28  24 24  ' Height:      Weight:   102.1 kg (225 lb 1.4 oz)   SpO2: 99%  97% 97%    Last BM Date: 02/29/16  Intake/Output   Yesterday:  03/13 0701 - 03/14 0700 In: 3412.8 [P.O.:20; I.V.:492.8; IV Piggyback:650; TPN:2250] Out: 0626 [Urine:1450; Stool:1] This shift:  Total I/O In: -  Out: 301 [Urine:300; Stool:1]   Physical Exam: General: Pt awake/alert/oriented x4 in no  acute distress  Abdomen: Soft.  Nondistended.   Mildly tender at incisions only.  Wound is superficial, granulation tissue, bleeding when vac removed, minimal, fascia is intact.  Wet to dry applied. RUQ drain with sanguinous output. No evidence of peritonitis.  No incarcerated hernias.    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 difficile colitis   Acute respiratory failure with hypoxia (HCC)   Perforated ulcer (HCC)   Duodenal ulcer  with perforation (HCC)   Abdominal pain   Bleeding gastrointestinal   Chronic kidney disease, stage III (moderate)   Urinary retention   Acute GI bleeding   Hyperglycemia   Hypophosphatemia   Hypokalemia   AICD (automatic cardioverter/defibrillator) present   Atrial fibrillation with RVR (HCC)   HLD (hyperlipidemia)   Perforated duodenal ulcer ([place])   Hypomagnesemia    Results:   Labs: Results for orders placed or performed during the hospital encounter of 02/18/16 (from the past 48 hour(s))  Phosphorus     Status: Abnormal   Collection Time: 02/28/16 10:34 AM  Result Value Ref Range   Phosphorus 5.7 (H) 2.5 - 4.6 mg/dL  Glucose, capillary     Status: Abnormal   Collection Time: 02/28/16 11:43 AM  Result Value Ref Range   Glucose-Capillary 103 (H) 65 - 99 mg/dL  Glucose, capillary     Status: Abnormal   Collection Time: 02/28/16  7:06 PM  Result Value Ref Range   Glucose-Capillary 149 (H) 65 - 99 mg/dL  Comprehensive metabolic panel     Status: Abnormal   Collection Time: 02/29/16  5:09 AM  Result Value Ref Range   Sodium 139 135 - 145 mmol/L   Potassium 3.6 3.5 - 5.1 mmol/L   Chloride 106 101 - 111 mmol/L   CO2 25 22 -  32 mmol/L   Glucose, Bld 117 (H) 65 - 99 mg/dL   BUN 20 6 - 20 mg/dL   Creatinine, Ser 1.15 0.61 - 1.24 mg/dL   Calcium 8.4 (L) 8.9 - 10.3 mg/dL   Total Protein 5.1 (L) 6.5 - 8.1 g/dL   Albumin 1.9 (L) 3.5 - 5.0 g/dL   AST 26 15 - 41 U/L   ALT 16 (L) 17 - 63 U/L   Alkaline Phosphatase 32 (L) 38 - 126 U/L   Total Bilirubin 0.3 0.3 - 1.2 mg/dL   GFR calc non Af Amer >60 >60 mL/min   GFR calc Af Amer >60 >60 mL/min    Comment: (NOTE) The eGFR has been calculated using the CKD EPI equation. This calculation has not been validated in all clinical situations. eGFR's persistently <60 mL/min signify possible Chronic Kidney Disease.    Anion gap 8 5 - 15  Magnesium     Status: None   Collection Time: 02/29/16  5:09 AM  Result Value Ref Range    Magnesium 2.0 1.7 - 2.4 mg/dL  Phosphorus     Status: None   Collection Time: 02/29/16  5:09 AM  Result Value Ref Range   Phosphorus 3.5 2.5 - 4.6 mg/dL  Prealbumin     Status: Abnormal   Collection Time: 02/29/16  5:09 AM  Result Value Ref Range   Prealbumin 11.7 (L) 18 - 38 mg/dL  CBC with Differential/Platelet     Status: Abnormal   Collection Time: 02/29/16  5:09 AM  Result Value Ref Range   WBC 10.8 (H) 4.0 - 10.5 K/uL   RBC 3.87 (L) 4.22 - 5.81 MIL/uL   Hemoglobin 9.9 (L) 13.0 - 17.0 g/dL   HCT 32.0 (L) 39.0 - 52.0 %   MCV 82.7 78.0 - 100.0 fL   MCH 25.6 (L) 26.0 - 34.0 pg   MCHC 30.9 30.0 - 36.0 g/dL   RDW 16.5 (H) 11.5 - 15.5 %   Platelets 334 150 - 400 K/uL   Neutrophils Relative % 70 %   Neutro Abs 7.5 1.7 - 7.7 K/uL   Lymphocytes Relative 18 %   Lymphs Abs 2.0 0.7 - 4.0 K/uL   Monocytes Relative 8 %   Monocytes Absolute 0.9 0.1 - 1.0 K/uL   Eosinophils Relative 4 %   Eosinophils Absolute 0.4 0.0 - 0.7 K/uL   Basophils Relative 0 %   Basophils Absolute 0.0 0.0 - 0.1 K/uL  Triglycerides     Status: None   Collection Time: 02/29/16  5:09 AM  Result Value Ref Range   Triglycerides 53 <150 mg/dL  Heparin level (unfractionated)     Status: Abnormal   Collection Time: 02/29/16  5:10 AM  Result Value Ref Range   Heparin Unfractionated 0.72 (H) 0.30 - 0.70 IU/mL    Comment:        IF HEPARIN RESULTS ARE BELOW EXPECTED VALUES, AND PATIENT DOSAGE HAS BEEN CONFIRMED, SUGGEST FOLLOW UP TESTING OF ANTITHROMBIN III LEVELS.   Protime-INR     Status: Abnormal   Collection Time: 02/29/16  5:10 AM  Result Value Ref Range   Prothrombin Time 18.0 (H) 11.6 - 15.2 seconds   INR 1.48 0.00 - 1.49  Glucose, capillary     Status: Abnormal   Collection Time: 02/29/16  5:20 AM  Result Value Ref Range   Glucose-Capillary 104 (H) 65 - 99 mg/dL  Glucose, capillary     Status: None   Collection Time: 02/29/16  7:52 AM  Result Value Ref Range   Glucose-Capillary 93 65 - 99 mg/dL   Glucose, capillary     Status: Abnormal   Collection Time: 02/29/16 12:01 PM  Result Value Ref Range   Glucose-Capillary 107 (H) 65 - 99 mg/dL  Heparin level (unfractionated)     Status: None   Collection Time: 02/29/16  2:17 PM  Result Value Ref Range   Heparin Unfractionated 0.46 0.30 - 0.70 IU/mL    Comment:        IF HEPARIN RESULTS ARE BELOW EXPECTED VALUES, AND PATIENT DOSAGE HAS BEEN CONFIRMED, SUGGEST FOLLOW UP TESTING OF ANTITHROMBIN III LEVELS.   Glucose, capillary     Status: Abnormal   Collection Time: 02/29/16  5:10 PM  Result Value Ref Range   Glucose-Capillary 109 (H) 65 - 99 mg/dL  Glucose, capillary     Status: Abnormal   Collection Time: 02/29/16 11:04 PM  Result Value Ref Range   Glucose-Capillary 108 (H) 65 - 99 mg/dL  Comprehensive metabolic panel     Status: Abnormal   Collection Time: 03/01/16  3:40 AM  Result Value Ref Range   Sodium 140 135 - 145 mmol/L   Potassium 3.6 3.5 - 5.1 mmol/L   Chloride 104 101 - 111 mmol/L   CO2 25 22 - 32 mmol/L   Glucose, Bld 108 (H) 65 - 99 mg/dL   BUN 21 (H) 6 - 20 mg/dL   Creatinine, Ser 1.11 0.61 - 1.24 mg/dL   Calcium 8.5 (L) 8.9 - 10.3 mg/dL   Total Protein 5.0 (L) 6.5 - 8.1 g/dL   Albumin 1.9 (L) 3.5 - 5.0 g/dL   AST 29 15 - 41 U/L   ALT 16 (L) 17 - 63 U/L   Alkaline Phosphatase 32 (L) 38 - 126 U/L   Total Bilirubin 0.4 0.3 - 1.2 mg/dL   GFR calc non Af Amer >60 >60 mL/min   GFR calc Af Amer >60 >60 mL/min    Comment: (NOTE) The eGFR has been calculated using the CKD EPI equation. This calculation has not been validated in all clinical situations. eGFR's persistently <60 mL/min signify possible Chronic Kidney Disease.    Anion gap 11 5 - 15  Magnesium     Status: None   Collection Time: 03/01/16  3:40 AM  Result Value Ref Range   Magnesium 1.9 1.7 - 2.4 mg/dL  Heparin level (unfractionated)     Status: None   Collection Time: 03/01/16  5:00 AM  Result Value Ref Range   Heparin Unfractionated 0.64  0.30 - 0.70 IU/mL    Comment:        IF HEPARIN RESULTS ARE BELOW EXPECTED VALUES, AND PATIENT DOSAGE HAS BEEN CONFIRMED, SUGGEST FOLLOW UP TESTING OF ANTITHROMBIN III LEVELS.   Protime-INR     Status: Abnormal   Collection Time: 03/01/16  5:00 AM  Result Value Ref Range   Prothrombin Time 18.8 (H) 11.6 - 15.2 seconds   INR 1.57 (H) 0.00 - 1.49  Glucose, capillary     Status: Abnormal   Collection Time: 03/01/16  5:08 AM  Result Value Ref Range   Glucose-Capillary 119 (H) 65 - 99 mg/dL    Imaging / Studies: No results found.  Medications / Allergies:  Scheduled Meds: . digoxin  0.125 mg Intravenous Daily  . furosemide  20 mg Intravenous Daily  . insulin aspart  0-9 Units Subcutaneous 3 times per day  . metoprolol  5 mg Intravenous Q4H  . metronidazole  500 mg  Intravenous Q8H  . pantoprazole (PROTONIX) IV  80 mg Intravenous Q12H  . piperacillin-tazobactam (ZOSYN)  IV  3.375 g Intravenous 3 times per day  . sodium chloride flush  10-40 mL Intracatheter Q12H   Continuous Infusions: . Marland KitchenTPN (CLINIMIX-E) Adult 80 mL/hr at 02/29/16 1734   And  . fat emulsion 240 mL (02/29/16 1734)  . heparin 2,100 Units/hr (03/01/16 0355)   PRN Meds:.albuterol, diphenhydrAMINE, fentaNYL (SUBLIMAZE) injection, [DISCONTINUED] ondansetron **OR** ondansetron (ZOFRAN) IV, promethazine, sodium chloride flush  Antibiotics: Anti-infectives    Start     Dose/Rate Route Frequency Ordered Stop   02/25/16 1400  piperacillin-tazobactam (ZOSYN) IVPB 3.375 g     3.375 g 12.5 mL/hr over 240 Minutes Intravenous 3 times per day 02/25/16 1231     02/25/16 1100  metroNIDAZOLE (FLAGYL) IVPB 500 mg     500 mg 100 mL/hr over 60 Minutes Intravenous Every 8 hours 02/25/16 1034     02/25/16 1100  ciprofloxacin (CIPRO) IVPB 400 mg  Status:  Discontinued     400 mg 200 mL/hr over 60 Minutes Intravenous Every 12 hours 02/25/16 1034 02/25/16 1210   02/23/16 0000  piperacillin-tazobactam (ZOSYN) IVPB 3.375 g  Status:   Discontinued    Comments:  Zosyn 3.375 g IV q8h for CrCl > 20 mL/min   3.375 g 12.5 mL/hr over 240 Minutes Intravenous 3 times per day 02/22/16 2351 02/25/16 1034   02/22/16 2245  metroNIDAZOLE (FLAGYL) IVPB 500 mg  Status:  Discontinued     500 mg 100 mL/hr over 60 Minutes Intravenous 3 times per day 02/22/16 2230 02/24/16 1030   02/22/16 2230  metroNIDAZOLE (FLAGYL) IVPB 500 mg  Status:  Discontinued     500 mg 100 mL/hr over 60 Minutes Intravenous Every 8 hours 02/22/16 2229 02/22/16 2230   02/22/16 1630  clindamycin (CLEOCIN) IVPB 600 mg    Comments:  Pharmacy may adjust dosing strength, interval, or rate of medication as needed for optimal therapy for the patient Send with patient on call to the OR.  Anesthesia to complete antibiotic administration <79mn [facility]   600 mg 100 mL/hr over 30 Minutes Intravenous On call to O.R. 02/22/16 1617 02/22/16 1915   02/19/16 0430  vancomycin (VANCOCIN) 50 mg/mL oral solution 125 mg  Status:  Discontinued     125 mg Oral 4 times daily 02/19/16 0417 02/25/16 1034        Assessment/Plan UGI bleeding & Perforated DU POD #8 s/p Ex Lap, graham patch, and drain placement - Dr. WDonne Hazel-does not want fulls, bowels functioning.  Will try puree diet -h pylori negative  -Mobilize, IS, up to chair, PT  -change to VAC BID wet to dry dressing changes  -JP drain(no output recorded, minimal sanguinous output in bulb) ID-zosyn D#8--stop.  Flagyl for c diff Acute DVT-heparin gtt.  May start coumadin VDRF - resolved  AFIB, CHF, CAD, MI, ICM, HLD - per cards ABL anemia -stable PCM-wean off TPN when tolerating POs, probably tomorrow Dispo-stable for transfer to floor from a surgical standpoint   EErby Pian [facility] Pager [phone](7A-[id number]:30P) For consults and floor pages call [phone](7A-4:30P)  03/01/2016 8:42 AM

## 2016-03-01 NOTE — Progress Notes (Signed)
ANTICOAGULATION CONSULT NOTE - Follow Up Consult  Pharmacy Consult for Heparin Indication: new bilateral DVT, hx of afib (Coumadin on hold)  Allergies  Allergen Reactions  . Keflex [Cephalexin] Shortness Of Breath and Other (See Comments)    dizziness     Patient Measurements: Height: 5\' 10"  (177.8 cm) Weight: 225 lb 1.4 oz (102.1 kg) IBW/kg (Calculated) : 73 Heparin Dosing Weight: 93 kg  Vital Signs: Temp: 98.6 F (37 C) (03/14 0751) Temp Source: Oral (03/14 0751) BP: 107/71 mmHg (03/14 0751) Pulse Rate: 102 (03/14 0751)  Labs:  Recent Labs  02/28/16 0504 02/28/16 0505 02/29/16 0509 02/29/16 0510 02/29/16 1417 03/01/16 0340 03/01/16 0500  HGB 8.5*  --  9.9*  --   --   --   --   HCT 26.8*  --  32.0*  --   --   --   --   PLT 337  --  334  --   --   --   --   LABPROT  --  17.8*  --  18.0*  --   --  18.8*  INR  --  1.46  --  1.48  --   --  1.57*  HEPARINUNFRC  --  0.63  --  0.72* 0.46  --  0.64  CREATININE 1.04  --  1.15  --   --  1.11  --     Estimated Creatinine Clearance: 76.2 mL/min (by C-G formula based on Cr of 1.11).  Assessment: 70 yo M with hx afib on warf 5mg  daily exc 2.5mg  on MWF with INR 3.28 on admit. Warfarin held for GIB + surgery for perforated duodenal ulcer. S/p OR for laparotomy.  New bilateral LE DVTs on dopplers - start heparin IV on 3/8.  Ultimately plan to bridge back to warfarin. INR today is 1.57. Heparin level remains therapeutic at 0.64. No bleeding noted, CBC not done today but had been stable.   Goal of Therapy:  Heparin level 0.3-0.7 units/ml Monitor platelets by anticoagulation protocol: Yes   Plan:  Continue heparin gtt at 2,100 units/hr Monitor daily HL, CBC, s/s of bleed F/U bridge back to Coumadin or other agent  Salome Arnt, PharmD, BCPS Pager # [phone] 03/01/2016 10:22 AM

## 2016-03-02 ENCOUNTER — Inpatient Hospital Stay
Admission: AD | Admit: 2016-03-02 | Discharge: 2016-03-16 | Disposition: A | Discharge status: Home Health | Payer: Medicare Other | Source: Ambulatory Visit | Attending: Internal Medicine | Admitting: Internal Medicine

## 2016-03-02 LAB — CBC
HCT: 27.5 % — ABNORMAL LOW (ref 39.0–52.0)
Hemoglobin: 8.5 g/dL — ABNORMAL LOW (ref 13.0–17.0)
MCH: 25.4 pg — AB (ref 26.0–34.0)
MCHC: 30.9 g/dL (ref 30.0–36.0)
MCV: 82.1 fL (ref 78.0–100.0)
Platelets: 402 10*3/uL — ABNORMAL HIGH (ref 150–400)
RBC: 3.35 MIL/uL — ABNORMAL LOW (ref 4.22–5.81)
RDW: 16.7 % — AB (ref 11.5–15.5)
WBC: 11.9 10*3/uL — ABNORMAL HIGH (ref 4.0–10.5)

## 2016-03-02 LAB — HEPARIN LEVEL (UNFRACTIONATED)
HEPARIN UNFRACTIONATED: 0.78 [IU]/mL — AB (ref 0.30–0.70)
HEPARIN UNFRACTIONATED: 0.45 [IU]/mL (ref 0.30–0.70)
HEPARIN UNFRACTIONATED: 0.67 [IU]/mL (ref 0.30–0.70)

## 2016-03-02 LAB — COMPREHENSIVE METABOLIC PANEL
ALBUMIN: 1.9 g/dL — AB (ref 3.5–5.0)
ALT: 19 U/L (ref 17–63)
ANION GAP: 12 (ref 5–15)
AST: 28 U/L (ref 15–41)
Alkaline Phosphatase: 30 U/L — ABNORMAL LOW (ref 38–126)
BUN: 20 mg/dL (ref 6–20)
CO2: 23 mmol/L (ref 22–32)
Calcium: 8.4 mg/dL — ABNORMAL LOW (ref 8.9–10.3)
Chloride: 103 mmol/L (ref 101–111)
Creatinine, Ser: 1.1 mg/dL (ref 0.61–1.24)
GFR calc non Af Amer: >60 mL/min (ref >60)
GLUCOSE: 120 mg/dL — AB (ref 65–99)
POTASSIUM: 3.7 mmol/L (ref 3.5–5.1)
SODIUM: 138 mmol/L (ref 135–145)
TOTAL PROTEIN: 5 g/dL — AB (ref 6.5–8.1)
Total Bilirubin: 0.4 mg/dL (ref 0.3–1.2)

## 2016-03-02 LAB — PROTIME-INR
INR: 1.66 — AB (ref 0.00–1.49)
PROTHROMBIN TIME: 19.7 s — AB (ref 11.6–15.2)

## 2016-03-02 LAB — BRAIN NATRIURETIC PEPTIDE: B NATRIURETIC PEPTIDE 5: 709 pg/mL — AB (ref 0.0–100.0)

## 2016-03-02 LAB — GLUCOSE, CAPILLARY
GLUCOSE-CAPILLARY: 84 mg/dL (ref 65–99)
Glucose-Capillary: 111 mg/dL — ABNORMAL HIGH (ref 65–99)

## 2016-03-02 LAB — MAGNESIUM: Magnesium: 1.9 mg/dL (ref 1.7–2.4)

## 2016-03-02 MED ORDER — TRACE MINERALS CR-CU-MN-SE-ZN 10-1000-500-60 MCG/ML IV SOLN
INTRAVENOUS | Status: DC
  Filled 2016-03-02: qty 1920

## 2016-03-02 MED ORDER — PANTOPRAZOLE SODIUM 40 MG PO TBEC: 80.0000 mg | DELAYED_RELEASE_TABLET | Freq: Two times a day (BID) | ORAL | Status: DC

## 2016-03-02 MED ORDER — VANCOMYCIN 50 MG/ML ORAL SOLUTION: 125.0000 mg | Freq: Four times a day (QID) | ORAL | Status: DC

## 2016-03-02 MED ORDER — ALBUTEROL SULFATE (2.5 MG/3ML) 0.083% IN NEBU: 2.5000 mg | INHALATION_SOLUTION | RESPIRATORY_TRACT | Status: DC | PRN

## 2016-03-02 MED ORDER — FAT EMULSION 20 % IV EMUL
240.0000 mL | INTRAVENOUS | Status: DC
  Filled 2016-03-02: qty 250

## 2016-03-02 MED ORDER — FUROSEMIDE 20 MG PO TABS: 20.0000 mg | ORAL_TABLET | Freq: Every day | ORAL | Status: DC

## 2016-03-02 MED ORDER — WARFARIN SODIUM 5 MG PO TABS: 2.5000 mg | ORAL_TABLET | Freq: Every day | ORAL | Status: DC

## 2016-03-02 MED ORDER — POTASSIUM CHLORIDE 10 MEQ/50ML IV SOLN
10.0000 meq | INTRAVENOUS | Status: DC
  Administered 2016-03-02 (×2): 10 meq via INTRAVENOUS
  Filled 2016-03-02 (×4): qty 50

## 2016-03-02 MED ORDER — VANCOMYCIN 50 MG/ML ORAL SOLUTION
125.0000 mg | Freq: Four times a day (QID) | ORAL | Status: DC
  Administered 2016-03-02: 125 mg via ORAL
  Filled 2016-03-02 (×2): qty 2.5

## 2016-03-02 MED ORDER — METOPROLOL TARTRATE 25 MG PO TABS: 12.5000 mg | ORAL_TABLET | Freq: Two times a day (BID) | ORAL | Status: DC

## 2016-03-02 NOTE — Progress Notes (Addendum)
PARENTERAL NUTRITION CONSULT NOTE - FOLLOW UP  Pharmacy Consult for TPN Indication: Prolonged ileus  Allergies  Allergen Reactions  . Keflex [Cephalexin] Shortness Of Breath and Other (See Comments)    dizziness    Patient Measurements: Height: 5\' 11"  (180.3 cm) Weight: 225 lb 1.4 oz (102.1 kg) IBW/kg (Calculated) : 75.3 Adjusted Body Weight:  Usual Weight:   Vital Signs: Temp: 98.2 F (36.8 C) (03/15 0811) Temp Source: Oral (03/15 0811) BP: 114/57 mmHg (03/15 0811) Pulse Rate: 101 (03/15 0811) Intake/Output from previous day: 03/14 0701 - 03/15 0700 In: 1797 [I.V.:357; IV Piggyback:550; TPN:890] Out: 2052 [Urine:2050; Stool:2] Intake/Output from this shift: Total I/O In: -  Out: 100 [Urine:100]  Labs:  Recent Labs  02/29/16 0509 02/29/16 0510 03/01/16 0500 03/02/16 0353  WBC 10.8*  --   --  11.9*  HGB 9.9*  --   --  8.5*  HCT 32.0*  --   --  27.5*  PLT 334  --   --  402*  INR  --  1.48 1.57* 1.66*     Recent Labs  02/28/16 1034 02/29/16 0509 03/01/16 0340 03/02/16 0353  NA  --  139 140 138  K  --  3.6 3.6 3.7  CL  --  106 104 103  CO2  --  25 25 23   GLUCOSE  --  117* 108* 120*  BUN  --  20 21* 20  CREATININE  --  1.15 1.11 1.10  CALCIUM  --  8.4* 8.5* 8.4*  MG  --  2.0 1.9 1.9  PHOS 5.7* 3.5  --   --   PROT  --  5.1* 5.0* 5.0*  ALBUMIN  --  1.9* 1.9* 1.9*  AST  --  26 29 28   ALT  --  16* 16* 19  ALKPHOS  --  32* 32* 30*  BILITOT  --  0.3 0.4 0.4  PREALBUMIN  --  11.7*  --   --   TRIG  --  53  --   --    Estimated Creatinine Clearance: 78.2 mL/min (by C-G formula based on Cr of 1.1).    Recent Labs  03/01/16 1204 03/01/16 2115 03/02/16 0810  GLUCAP 117* 100* 111*    Medications:  Scheduled:  . digoxin  0.125 mg Intravenous Daily  . furosemide  20 mg Oral Daily  . insulin aspart  0-9 Units Subcutaneous 3 times per day  . metoprolol tartrate  12.5 mg Oral BID  . metronidazole  500 mg Intravenous Q8H  . pantoprazole (PROTONIX)  IV  80 mg Intravenous Q12H  . sodium chloride flush  10-40 mL Intracatheter Q12H    Insulin Requirements in the past 24 hours:  None   Assessment: 69 YOM presented on 02/18/16 with hypotension and dizziness from GIB. Found to have a perforated duodenum and was taken to the OR on 02/22/16 for ex-lap with Phillip Heal patch of duodenal ulcer. Pharmacy consulted to initiate TPN for nutritional support given expected prolonged bowel rest.  GI: admitted with GIB on high dose PPI IV BID, baseline prealbumin low at 8.3. NGT d/c'd 3/12 & CLD - poor appetite noted. Nauseated Prealbumin up to 11.7. +BM 3/13  Endo: TSH WNL. No hx DM - CBGs controlled - no SSI usage  Lytes: K+ 3.7 -despite replacement yesterday, will replace again, Mg 1.9, Phos 3.5.  Renal: CKD3, s/p recent TURP (2/6) - SCr 1.1, BUN WNL. UOP 0.6 ml/kg/hr, NS at kvo. I/O +1.9 L yesterday (+10 L total)  Pulm: 2L  Cards: CAD / ICM / AFib / DL / AICD / CHF - BP wnl, tachy (in Afib, & anemic). No active CHF & CXR clear - Digoxin IV, Lopressor IV, Lasix 20mg  IV daily  AC: Coumadin PTA for hx AFib >> held d/t GIB. New DVT on 3/7 > heparin gtt started 3/8.  Hepatobil: LFTs WNL. TG WNL 53  Neuro: hx depression   Heme: Anemia 2nd GIB, PRBC x 2 on 3/9 , hgb 8.5, plts wnl  ID: Flagyl IV for C.diff colitis + Zosyn day #8 for intra-abd infxn, long course of Cipro PTA - afebrile, WBC 12.2>10.8  Best Practices: heparin gtt, IV PPI  TPN Access: right IJ placed 02/22/16, PICC placed 02/24/16  TPN start date: 02/24/16  Current Nutrition: TPN + CLD  Nutritional Goals:  2000-2200 kCal, 120-130 grams of protein per day (goal 128ml/hr) per RD 3/8  Plan:  - Clinimix E 5/15 at 80 ml/hr, 96g protein, continue IV Lipids 20% at 10 ml/hr, this will provide 1843 kcal and 96 g AA - I will not advance pt to goal rate as his PO diet is being advanced, looking like TPN will be weaned soon  - F/u plans to wean TPN - KCl 10 mEq x4 - Daily multivitamin,  folate and trace elements in TPN - F/u TPN labs tomorrow   Hughes Better, PharmD, BCPS Clinical Pharmacist 03/02/2016 9:13 AM    Addendum -Per hospitalist, the pt is expected to discharge today to long term facility, she asked to wean TPN -Will reduce today's bag to half rate and will discontinue order at 1800 -TPN for tonight will not be made or administered   Harvel Quale  03/02/2016 11:11 AM

## 2016-03-02 NOTE — Progress Notes (Signed)
ANTICOAGULATION CONSULT NOTE - Follow Up Consult  Pharmacy Consult for Heparin + Warfarin Indication: new bilateral DVT, hx of afib  Allergies  Allergen Reactions  . Keflex [Cephalexin] Shortness Of Breath and Other (See Comments)    dizziness     Patient Measurements: Height: 5\' 11"  (180.3 cm) Weight: 225 lb 1.4 oz (102.1 kg) IBW/kg (Calculated) : 75.3 Heparin Dosing Weight: 93 kg  Vital Signs: Temp: 98.2 F (36.8 C) (03/15 0811) Temp Source: Oral (03/15 0811) BP: 114/57 mmHg (03/15 0811) Pulse Rate: 101 (03/15 0811)  Labs:  Recent Labs  02/29/16 0509  02/29/16 0510 02/29/16 1417 03/01/16 0340 03/01/16 0500 03/02/16 0353  HGB 9.9*  --   --   --   --   --  8.5*  HCT 32.0*  --   --   --   --   --  27.5*  PLT 334  --   --   --   --   --  402*  LABPROT  --   --  18.0*  --   --  18.8* 19.7*  INR  --   --  1.48  --   --  1.57* 1.66*  HEPARINUNFRC  --   < > 0.72* 0.46  --  0.64 0.78*  CREATININE 1.15  --   --   --  1.11  --  1.10  < > = values in this interval not displayed.  Estimated Creatinine Clearance: 78.2 mL/min (by C-G formula based on Cr of 1.1).  Assessment: 70 yo M with hx afib on warf 5mg  daily exc 2.5mg  on MWF with INR 3.28 on admit. Warfarin held for GIB + surgery for perforated duodenal ulcer. S/p OR for laparotomy.  New bilateral LE DVTs on dopplers - start heparin IV on 3/8. Warfarin to be restarted today (3/15)  Heparin level this morning was slightly elevated at 0.78 - the rate was reduced and a new heparin level has been drawn. This level will not result prior to the patient's transfer to [facility] so it will need to be followed up on at a later time. This was discussed with the pharmacist staffing at [facility]. Hgb drop - 8.5 << 9.9 - no overt s/sx of bleeding noted at this time, will watch.   INR is SUBtherapeutic at 1.66 - no warfarin has been given this admission. The patient has been on flagyl for CDiff treatment since 3/6. This will be transitioned over  to Vancomycin po to complete CDiff treatment. The flagyl may interact with the initial warfarin doses so will start conservatively at a 50% dose decrease.  It is hard to recommend a discharge dosing recommendation for this patient given the recent flagyl and the fact that the patient's INR has not trended below 1.35 without any warfarin doses this admission. My best recommendation would be a low dose of 2.5 mg/day initially. The patient will need to continue the heparin bridge for 5 days due to the new VTE. Today is VTE overlap D#1/5.  PTA warfarin dose was 5 mg daily EXCEPT for 2.5 mg on MWF  Goal of Therapy:  INR 2-3 Heparin level 0.3-0.7 units/ml Monitor platelets by anticoagulation protocol: Yes   Plan:  1. Warfarin 2 mg x 1 dose at 1800 today (if still here) 2. Continue Heparin at 2000 units/hr (20 ml/hr) - [facility] will need to follow-up on the heparin level that has been drawn (the pharmacist has been notified and is aware) 3. Daily PT/INR, HL, CBC 4. Will continue  to monitor for any signs/symptoms of bleeding and will follow up with PT/INR in the a.m.   Alycia Rossetti, PharmD, BCPS Clinical Pharmacist Pager: [phone] 03/02/2016 10:48 AM

## 2016-03-02 NOTE — Discharge Summary (Signed)
Physician Discharge Summary  [name] [id number] DOB: 1946/03/12 DOA: 02/18/2016  PCP: [name], MD  Admit date: 02/18/2016 Discharge date: 03/02/2016  Recommendations for Outpatient Follow-up:   Pt will be discharged to [facility] for continuation of care  Please note following recommendations per surgery team, encourage mobilization, incentive spirometry, physical therapy  Also changed to [facility], twice a day wet to dry dressing changes  Please note that patient has been on heparin drip while hospitalized and was changed to oral Coumadin once he was able to tolerate by mouth  Dose of Coumadin on discharge 2.5 mg, please monitor PT/INR and readjust the dose as clinically indicated  Please wean off TPN based on patient's by mouth intake  Please note that patient's home medications have been stopped in the setting of perforated duodenal ulcer  We have resumed Coreg and metoprolol, Lasix as per home regimen  Following medication eplerenone also on hold, can be resumed within next 24-24 hours based on patient's ability to continue taking by mouth  Pt reported his Losartan has been discontinued so we have removed it from the list   In addition please note that patient needs to complete treatment with oral vancomycin for C. difficile colitis for 6 more doses  Discharge Diagnoses:  Principal Problem:   Symptomatic anemia Active Problems:   CAD S/P LAD PCI- [id number]   Chronic systolic CHF (congestive heart failure) (HCC)   Biventricular implantable cardioverter-defibrillator [employer]   BPH- s/p TURP 01/25/16 - followed by 4 weeks of Cipro   GI bleed   Hypotension  Discharge Condition: Stable  Diet recommendation: Currently on Dys I diet, tapering off TPN  History of present illness:  70 y.o. WM PMHx Depression, CAD native artery, MI, Ischemic Cardiomyopathy with cardiac pacemaker, Persistent Atrial Fibrillation on Coumadin, HLD, Chronic Renal Insufficiency, presented with  aabd cramping and constipation, dizziness upon standing and noted low BP at home as low as 70/40.   Assessment/Plan:  Acute respiratory failure with hypoxia - Oxygen to keep SpO2 > 92% - DuoNeb as needed   Hypotension 2nd to acute blood loss from GI bleed - Resolved  Chronic systolic CHF s/p AICD  - LVEF 20 to 25% from Echo 01/06/16 - Metoprolol 12.5 mg BID - Lasix 20 mg daily  A Fib with RVR,/ CAD - continue metoprolol, resumed Coreg as per previous home regimen  - Digoxin 0.125 mg daily   HLD. - Lipid panel W/I NCEP guidelines  B/l lower extremity DVT  - resumed Coumadin per pharmacy   CKD stage 3 - Resolved Cr WNL  Urinary retention - S/p TURP 01/25/16  Acute GI bleed from perforated duodenal ulcer  - s/p Phillip Heal patch 3/06. - changed Protonix to PO 80 mg BID PO  - tolerating dys I diet   Acute blood loss Anemia/ Critical illness anemia  -Transfuse for Hb < 7 - Hg overall stable, no signs of bleeding   Positive C diff colitis  Was on flagyl IV, will transition to oral vanc since pt taking PO and less interaction with coumadin   Hyperglycemia. - reasonable control   Hypophosphatemia - WNL  Hypokalemia. - Potassium goal > 4   Hypomagnesemia - Magnesium goal> 2  Code Status: FULL Family Communication: None present at time of exam Disposition Plan: D/C to LTAC  Consultants: Dr.Haywood Dalbert Batman CCS Dr.David Loren Racer Rady [facility] [place] M  Procedure/Significant Events: 3/06 CT chest >> no PE 3/06 CT abd/pelvis >> pneumoperitoneum 3/06 Pneumoperitoneum on CT abd >>  Surgery consulted; cardiology consulted 3/6 graham patch perforated duodenal ulcer,19 Fr blake drain near patch 3/07 Doppler legs b/l >> DVT Rt soleal vein, DVT Lt peroneal vein 3/08 transfuse PRBC 3/11 UGI water-soluble contrast;Irregular outpouching of contrast along the duodenal bulb in the region of the patient's known duodenal ulcer. No spill into the peritoneal  cavity.  Culture 3/02 C diff PCR >> positive  Antibiotics: 3/02 Enteral vancomycin >> 3/09 3/06 Zosyn >> 3/09 3/09 Cipro 1 dose 3/06 Flagyl >> changed to oral vanc 3/15, stop after last dose on 03/03/2016  LINES / TUBES:  Abdominal wound VAC in place  Procedures/Studies: Dg Chest 2 View 02/18/2016 No active cardiopulmonary disease.  Dg Abd 1 View 02/24/2016  Enteric tube tip is position in the left upper quadrant consistent with location in the upper stomach.  Ct Angio Chest Pe W/cm &/or Wo Cm 02/22/2016  1. Pneumoperitoneum with extraluminal air and fluid surrounding the distal stomach and proximal duodenum highly suspicious for a perforated peptic ulcer. General surgical evaluation recommended. 2. No evidence of bowel obstruction or abscess. 3. No evidence of acute pulmonary embolism. 4. Small bilateral pleural effusions with mild dependent atelectasis at both lung bases. 5. Moderate diffuse atherosclerosis. 6. Cholelithiasis, bilateral renal cysts and recent postsurgical changes in the prostate gland.  Dg Chest Port 1 View 02/24/2016 Status post PICC line as described.  This could be withdrawn 2-3 cm.  Dg Chest Port 1 View 02/24/2016  Probable extubation of the trachea. Persistent mild hypoinflation with worsening bibasilar atelectasis or pneumonia on the left. A small amount of pleural fluid is likely present at both lung bases. Advancement of the nasogastric tube by 10 cm is recommended to assure that the proximal port remains below the GE junction.   Dg Ugi W/water Sol Cm 02/27/2016  No evidence of leak. Irregular outpouching of contrast along the duodenal bulb in the region of the patient's known duodenal ulcer. No spill into the peritoneal cavity.   Discharge Exam: Filed Vitals:   03/02/16 0418 03/02/16 0811  BP: 121/63 114/57  Pulse: 85 101  Temp: 97.8 F (36.6 C) 98.2 F (36.8 C)  Resp: 20 18   Filed Vitals:   03/01/16 1500 03/01/16 2015 03/02/16 0418 03/02/16 0811  BP:  112/67 116/60 121/63 114/57  Pulse: 93 89 85 101  Temp: 98.3 F (36.8 C) 97.4 F (36.3 C) 97.8 F (36.6 C) 98.2 F (36.8 C)  TempSrc: Oral Oral Oral Oral  Resp: 24 19 20 18   Height:  5\' 11"  (1.803 m)    Weight:  102.1 kg (225 lb 1.4 oz)    SpO2: 96% 100% 98% 98%    General: Pt is alert, follows commands appropriately, not in acute distress Cardiovascular: Regular rate and rhythm, no rubs, no gallops Respiratory: Clear to auscultation bilaterally, no wheezing, no crackles, no rhonchi Abdominal: Soft, non tender, non distended, no guarding  Discharge Instructions  Discharge Instructions    Diet - low sodium heart healthy    Complete by:  As directed      Increase activity slowly    Complete by:  As directed             Medication List    STOP taking these medications        cephALEXin 500 MG capsule  Commonly known as:  KEFLEX     eplerenone 25 MG tablet  Commonly known as:  INSPRA     folic acid [id number] MCG tablet  Commonly known as:  FOLVITE     LORazepam 1 MG tablet  Commonly known as:  ATIVAN     losartan 25 MG tablet  Commonly known as:  COZAAR     metoprolol succinate 25 MG 24 hr tablet  Commonly known as:  TOPROL-XL     multivitamin with minerals Tabs tablet     nitroGLYCERIN 0.4 MG SL tablet  Commonly known as:  NITROSTAT     oxyCODONE-acetaminophen 5-325 MG tablet  Commonly known as:  ROXICET     potassium chloride SA 20 MEQ tablet  Commonly known as:  K-DUR,KLOR-CON     vitamin C 1000 MG tablet      TAKE these medications        albuterol (2.5 MG/3ML) 0.083% nebulizer solution  Commonly known as:  PROVENTIL  Take 3 mLs (2.5 mg total) by nebulization every 4 (four) hours as needed for wheezing or shortness of breath.     carvedilol 25 MG tablet  Commonly known as:  COREG  Take one-half tablet by  mouth twice a day with  meals     DIGOX 0.125 MG tablet  Generic drug:  digoxin  Take one-half tablet by  mouth daily     fenofibrate 160 MG  tablet  Take 1 tablet by mouth  daily     furosemide 20 MG tablet  Commonly known as:  LASIX  Take 1 tablet (20 mg total) by mouth daily.     metoprolol tartrate 25 MG tablet  Commonly known as:  LOPRESSOR  Take 0.5 tablets (12.5 mg total) by mouth 2 (two) times daily.     pantoprazole 40 MG tablet  Commonly known as:  PROTONIX  Take 2 tablets (80 mg total) by mouth 2 (two) times daily.     pravastatin 40 MG tablet  Commonly known as:  PRAVACHOL  Take 1 tablet by mouth  daily     sertraline 100 MG tablet  Commonly known as:  ZOLOFT  Take 100 mg by mouth daily.     UROGESIC-BLUE 81.6 MG Tabs  Take 1 tablet 2-4 x/day as needed for urinary spasm or  burning     vancomycin 50 mg/mL oral solution  Commonly known as:  VANCOCIN  Take 2.5 mLs (125 mg total) by mouth every 6 (six) hours. Stop after last dose on March 16th, 2017     warfarin 5 MG tablet  Commonly known as:  COUMADIN  Take 0.5 tablets (2.5 mg total) by mouth daily at 6 PM.           Follow-up Information    Follow up with [name], MD.   Specialty:  Family Medicine   Contact information:   [id number] [place] [address] [phone]        The results of significant diagnostics from this hospitalization (including imaging, microbiology, ancillary and laboratory) are listed below for reference.     Microbiology: Recent Results (from the past 240 hour(s))  Surgical pcr screen     Status: None   Collection Time: 02/22/16  4:27 PM  Result Value Ref Range Status   MRSA, PCR NEGATIVE NEGATIVE Final   Staphylococcus aureus NEGATIVE NEGATIVE Final     Labs: Basic Metabolic Panel:  Recent Labs Lab 02/25/16 0206 02/26/16 0532 02/27/16 0500 02/28/16 0504 02/28/16 1034 02/29/16 0509 03/01/16 0340 03/02/16 0353  NA 143 142 141 139  --  139 140 138  K 3.4* 3.5 3.4* 3.3*  --  3.6 3.6  3.7  CL 107 109 105 107  --  106 104 103  CO2 24 23 26 26   --  25 25 23   GLUCOSE 141*  130* 119* 125*  --  117* 108* 120*  BUN 17 16 17 20   --  20 21* 20  CREATININE 1.40* 1.07 0.94 1.04  --  1.15 1.11 1.10  CALCIUM 8.5* 8.2* 8.5* 8.3*  --  8.4* 8.5* 8.4*  MG 2.1  --   --  1.8  --  2.0 1.9 1.9  PHOS 2.6 2.3* 2.7  --  5.7* 3.5  --   --    Liver Function Tests:  Recent Labs Lab 02/27/16 0500 02/28/16 0504 02/29/16 0509 03/01/16 0340 03/02/16 0353  AST 16 20 26 29 28   ALT 11* 12* 16* 16* 19  ALKPHOS 32* 30* 32* 32* 30*  BILITOT 0.4 0.5 0.3 0.4 0.4  PROT 5.3* 5.2* 5.1* 5.0* 5.0*  ALBUMIN 2.1* 1.9* 1.9* 1.9* 1.9*   CBC:  Recent Labs Lab 02/25/16 0206 02/26/16 0532 02/27/16 0500 02/28/16 0504 02/29/16 0509 03/02/16 0353  WBC 13.2* 11.3* 12.2* 12.4* 10.8* 11.9*  NEUTROABS 10.9*  --  8.2* 8.2* 7.5  --   HGB 9.3* 9.1* 9.3* 8.5* 9.9* 8.5*  HCT 27.2* 28.9* 29.2* 26.8* 32.0* 27.5*  MCV 81.4 81.9 81.6 81.0 82.7 82.1  PLT 243 280 314 337 334 402*   BNP (last 3 results)  Recent Labs  02/18/16 0940 03/02/16 0353  BNP 156.7* 709.0*   CBG:  Recent Labs Lab 02/29/16 2304 03/01/16 0508 03/01/16 1204 03/01/16 2115 03/02/16 0810  GLUCAP 108* 119* 117* 100* 111*   SIGNED: Time coordinating discharge: 30 minutes  MAGICK-Lu Paradise, MD  [facility] 03/02/2016, 11:02 AM Pager [phone]  If 7PM-7AM, please contact night-coverage [email or web address] Password [email or web address]

## 2016-03-02 NOTE — Progress Notes (Signed)
9 Days Post-Op  Subjective: Patient having bowel movements Tolerating pureed diet, but doesn't like the texture Being transferred to [facility] today  Objective: Vital signs in last 24 hours: Temp:  [97.4 F (36.3 C)-98.3 F (36.8 C)] 98.2 F (36.8 C) (03/15 0811) Pulse Rate:  [85-101] 101 (03/15 0811) Resp:  [18-24] 18 (03/15 0811) BP: (112-121)/(57-67) 114/57 mmHg (03/15 0811) SpO2:  [96 %-100 %] 98 % (03/15 0811) Weight:  [102.1 kg (225 lb 1.4 oz)] 102.1 kg (225 lb 1.4 oz) (03/14 2015) Last BM Date: 03/01/16  Intake/Output from previous day: 03/14 0701 - 03/15 0700 In: 1797 [I.V.:357; IV Piggyback:550; TPN:890] Out: 2052 [Urine:2050; Stool:2] Intake/Output this shift: Total I/O In: 240 [P.O.:240] Out: 100 [Urine:100]  General appearance: alert, cooperative and no distress GI: soft, minimal tenderness; drain with minimal output Midline wound - open, clean, minimal drainage  Lab Results:   Recent Labs  02/29/16 0509 03/02/16 0353  WBC 10.8* 11.9*  HGB 9.9* 8.5*  HCT 32.0* 27.5*  PLT 334 402*   BMET  Recent Labs  03/01/16 0340 03/02/16 0353  NA 140 138  K 3.6 3.7  CL 104 103  CO2 25 23  GLUCOSE 108* 120*  BUN 21* 20  CREATININE 1.11 1.10  CALCIUM 8.5* 8.4*   PT/INR  Recent Labs  03/01/16 0500 03/02/16 0353  LABPROT 18.8* 19.7*  INR 1.57* 1.66*   ABG No results for input(s): PHART, HCO3 in the last 72 hours.  Invalid input(s): PCO2, PO2  Studies/Results: No results found.  Anti-infectives: Anti-infectives    Start     Dose/Rate Route Frequency Ordered Stop   03/02/16 1200  vancomycin (VANCOCIN) 50 mg/mL oral solution 125 mg     125 mg Oral 4 times per day 03/02/16 1036 03/03/16 2359   03/02/16 0000  vancomycin (VANCOCIN) 50 mg/mL oral solution     125 mg Oral Every 6 hours 03/02/16 1102     02/25/16 1400  piperacillin-tazobactam (ZOSYN) IVPB 3.375 g  Status:  Discontinued     3.375 g 12.5 mL/hr over 240 Minutes  Intravenous 3 times per day 02/25/16 1231 03/01/16 0840   02/25/16 1100  metroNIDAZOLE (FLAGYL) IVPB 500 mg  Status:  Discontinued     500 mg 100 mL/hr over 60 Minutes Intravenous Every 8 hours 02/25/16 1034 03/02/16 1035   02/25/16 1100  ciprofloxacin (CIPRO) IVPB 400 mg  Status:  Discontinued     400 mg 200 mL/hr over 60 Minutes Intravenous Every 12 hours 02/25/16 1034 02/25/16 1210   02/23/16 0000  piperacillin-tazobactam (ZOSYN) IVPB 3.375 g  Status:  Discontinued    Comments:  Zosyn 3.375 g IV q8h for CrCl > 20 mL/min   3.375 g 12.5 mL/hr over 240 Minutes Intravenous 3 times per day 02/22/16 2351 02/25/16 1034   02/22/16 2245  metroNIDAZOLE (FLAGYL) IVPB 500 mg  Status:  Discontinued     500 mg 100 mL/hr over 60 Minutes Intravenous 3 times per day 02/22/16 2230 02/24/16 1030   02/22/16 2230  metroNIDAZOLE (FLAGYL) IVPB 500 mg  Status:  Discontinued     500 mg 100 mL/hr over 60 Minutes Intravenous Every 8 hours 02/22/16 2229 02/22/16 2230   02/22/16 1630  clindamycin (CLEOCIN) IVPB 600 mg    Comments:  Pharmacy may adjust dosing strength, interval, or rate of medication as needed for optimal therapy for the patient Send with patient on call to the OR.  Anesthesia to complete antibiotic administration <15min prior to incision per Best  Practice.   600 mg 100 mL/hr over 30 Minutes Intravenous On call to O.R. 02/22/16 1617 02/22/16 1915   02/19/16 0430  vancomycin (VANCOCIN) 50 mg/mL oral solution 125 mg  Status:  Discontinued     125 mg Oral 4 times daily 02/19/16 0417 02/25/16 1034      Assessment/Plan: s/p Procedure(s): EXPLORATORY LAPAROTOMY WITH  PATCH OF DUODENAL ULCER (N/A) Discharge to [facility] per primary team BID wet to dry dressings Drain will be removed prior to discharge Follow-up in discharge section of chart.    LOS: 13 days    Edi Gorniak K. 03/02/2016

## 2016-03-02 NOTE — Discharge Instructions (Signed)

## 2016-03-02 NOTE — Progress Notes (Signed)
ANTICOAGULATION CONSULT NOTE - Follow Up Consult  Pharmacy Consult for heparin Indication: new DVT and h/o Afib   Labs:  Recent Labs  02/28/16 0504  02/29/16 0509 02/29/16 0510 02/29/16 1417 03/01/16 0340 03/01/16 0500 03/02/16 0353  HGB 8.5*  --  9.9*  --   --   --   --  8.5*  HCT 26.8*  --  32.0*  --   --   --   --  27.5*  PLT 337  --  334  --   --   --   --  402*  LABPROT  --   < >  --  18.0*  --   --  18.8* 19.7*  INR  --   < >  --  1.48  --   --  1.57* 1.66*  HEPARINUNFRC  --   < >  --  0.72* 0.46  --  0.64 0.78*  CREATININE 1.04  --  1.15  --   --  1.11  --   --   < > = values in this interval not displayed.   Assessment: 70yo male now supratherapeutic on heparin after two levels at goal though had been trending up.  Goal of Therapy:  Heparin level 0.3-0.7 units/ml   Plan:  Will decrease heparin gtt slightly to 2000 units/hr and check level in [place], PharmD, BCPS  03/02/2016,4:53 AM

## 2016-03-02 NOTE — Progress Notes (Signed)
Report given to Charge RN Minette Brine, at [facility]. Pt will be transferred to [id number]. Pt stable.

## 2016-03-02 NOTE — Progress Notes (Signed)
Physical Therapy Treatment Patient Details Name: [name] MRN: [id number] DOB: 1946-11-14 Today's Date: 03/02/2016    History of Present Illness Patient is a 70 y/o male admitted with UGI bleeding & Perforated DU now s/p  Phillip Heal patch closure.  Also now with bilat DVT's.  PMH positive for recent TURP, a-fib, Chronic systolic CHF, Cardiomyopathy, ischemic-EF 20-25% w/ ICD.    PT Comments    Pt HR up to 140s during last 5 feet of gait.  Amb 63' with RW and slow gait pattern.  Pt is motivated to work with PT. Plan at this time is LTACH, possibly today.  Follow Up Recommendations  LTACH     Equipment Recommendations  None recommended by PT    Recommendations for Other Services       Precautions / Restrictions Precautions Precautions: Fall Precaution Comments: enteric precautions Restrictions Weight Bearing Restrictions: No    Mobility  Bed Mobility Overal bed mobility: Needs Assistance Bed Mobility: Rolling;Sidelying to Sit;Sit to Sidelying Rolling: Min guard Sidelying to sit: Min assist     Sit to sidelying: Min assist General bed mobility comments: Cues for technique.  A for trunk sor sidelying to sit and A with legs for sit > sidelying  Transfers Overall transfer level: Needs assistance Equipment used: Rolling walker (2 wheeled) Transfers: Sit to/from Stand Sit to Stand: Min guard;+2 safety/equipment         General transfer comment: cues for technique  Ambulation/Gait Ambulation/Gait assistance: Min guard;+2 safety/equipment Ambulation Distance (Feet): 35 Feet Assistive device: Rolling walker (2 wheeled) Gait Pattern/deviations: Decreased stride length Gait velocity: decreased Gait velocity interpretation: Below normal speed for age/gender General Gait Details: Slow guarded gait pattern.  Heavy reliance on RW, but not flexed.  HR in 120-130s, and then in last 5 feet of gait increased to 140s.   Stairs            Wheelchair Mobility     Modified Rankin (Stroke Patients Only)       Balance   Sitting-balance support: Feet supported Sitting balance-[name] Scale: Good     Standing balance support: Bilateral upper extremity supported Standing balance-[name] Scale: Poor Standing balance comment: requires RW                    Cognition Arousal/Alertness: Awake/alert Behavior During Therapy: WFL for tasks assessed/performed Overall Cognitive Status: Within Functional Limits for tasks assessed                      Exercises      General Comments General comments (skin integrity, edema, etc.): HR 91 at end of session      Pertinent Vitals/Pain Pain Assessment: Faces Faces Pain Scale: Hurts a little bit Pain Location: Abd Pain Intervention(s): Limited activity within patient's tolerance    Home Living                      Prior Function            PT Goals (current goals can now be found in the care plan section) Acute Rehab PT Goals Patient Stated Goal: To go to rehab and then silver sneakers PT Goal Formulation: With patient Time For Goal Achievement: 03/10/16 Potential to Achieve Goals: Good Progress towards PT goals: Progressing toward goals    Frequency  Min 3X/week    PT Plan Discharge plan needs to be updated (After session, LTAC rep saw pt and he is to be dc today )  Co-evaluation             End of Session Equipment Utilized During Treatment: Gait belt Activity Tolerance: Patient limited by fatigue Patient left: in bed;with bed alarm set;with call bell/phone within reach (Pt declining sitting in recliner, but with HOB upright)     Time: [id number] PT Time Calculation (min) (ACUTE ONLY): 35 min  Charges:  $Gait Training: 8-22 mins $Therapeutic Activity: 8-22 mins                    G Codes:      Scotland Korver LUBECK 03/02/2016, 11:06 AM

## 2016-03-03 LAB — HEPARIN LEVEL (UNFRACTIONATED)
HEPARIN UNFRACTIONATED: 0.29 [IU]/mL — AB (ref 0.30–0.70)
Heparin Unfractionated: 0.36 IU/mL (ref 0.30–0.70)

## 2016-03-03 LAB — COMPREHENSIVE METABOLIC PANEL
ALBUMIN: 2 g/dL — AB (ref 3.5–5.0)
ALK PHOS: 33 U/L — AB (ref 38–126)
ALT: 16 U/L — ABNORMAL LOW (ref 17–63)
ANION GAP: 8 (ref 5–15)
AST: 22 U/L (ref 15–41)
BUN: 20 mg/dL (ref 6–20)
CO2: 24 mmol/L (ref 22–32)
Calcium: 8.6 mg/dL — ABNORMAL LOW (ref 8.9–10.3)
Chloride: 106 mmol/L (ref 101–111)
Creatinine, Ser: 1.1 mg/dL (ref 0.61–1.24)
GFR calc Af Amer: >60 mL/min (ref >60)
GFR calc non Af Amer: >60 mL/min (ref >60)
GLUCOSE: 121 mg/dL — AB (ref 65–99)
POTASSIUM: 3.9 mmol/L (ref 3.5–5.1)
SODIUM: 138 mmol/L (ref 135–145)
Total Bilirubin: <0.1 mg/dL — ABNORMAL LOW (ref 0.3–1.2)
Total Protein: 5.3 g/dL — ABNORMAL LOW (ref 6.5–8.1)

## 2016-03-03 LAB — CBC WITH DIFFERENTIAL/PLATELET
BASOS ABS: 0 10*3/uL (ref 0.0–0.1)
BASOS PCT: 0 %
EOS ABS: 0.3 10*3/uL (ref 0.0–0.7)
Eosinophils Relative: 3 %
HEMATOCRIT: 27 % — AB (ref 39.0–52.0)
HEMOGLOBIN: 8.4 g/dL — AB (ref 13.0–17.0)
Lymphocytes Relative: 20 %
Lymphs Abs: 2.6 10*3/uL (ref 0.7–4.0)
MCH: 25.6 pg — ABNORMAL LOW (ref 26.0–34.0)
MCHC: 31.1 g/dL (ref 30.0–36.0)
MCV: 82.3 fL (ref 78.0–100.0)
MONOS PCT: 6 %
Monocytes Absolute: 0.8 10*3/uL (ref 0.1–1.0)
NEUTROS ABS: 8.9 10*3/uL — AB (ref 1.7–7.7)
Neutrophils Relative %: 71 %
Platelets: 418 10*3/uL — ABNORMAL HIGH (ref 150–400)
RBC: 3.28 MIL/uL — ABNORMAL LOW (ref 4.22–5.81)
RDW: 16.6 % — AB (ref 11.5–15.5)
WBC: 12.6 10*3/uL — AB (ref 4.0–10.5)

## 2016-03-03 LAB — MAGNESIUM: MAGNESIUM: 1.9 mg/dL (ref 1.7–2.4)

## 2016-03-03 LAB — PHOSPHORUS: PHOSPHORUS: 3.7 mg/dL (ref 2.5–4.6)

## 2016-03-03 LAB — BRAIN NATRIURETIC PEPTIDE: B Natriuretic Peptide: 513.1 pg/mL — ABNORMAL HIGH (ref 0.0–100.0)

## 2016-03-03 LAB — DIGOXIN LEVEL: Digoxin Level: <0.2 ng/mL — ABNORMAL LOW (ref 0.8–2.0)

## 2016-03-03 LAB — PROTIME-INR
INR: 1.56 — ABNORMAL HIGH (ref 0.00–1.49)
Prothrombin Time: 18.8 seconds — ABNORMAL HIGH (ref 11.6–15.2)

## 2016-03-04 LAB — CBC
HEMATOCRIT: 27.7 % — AB (ref 39.0–52.0)
HEMOGLOBIN: 8.6 g/dL — AB (ref 13.0–17.0)
MCH: 25.3 pg — AB (ref 26.0–34.0)
MCHC: 31 g/dL (ref 30.0–36.0)
MCV: 81.5 fL (ref 78.0–100.0)
Platelets: 459 10*3/uL — ABNORMAL HIGH (ref 150–400)
RBC: 3.4 MIL/uL — ABNORMAL LOW (ref 4.22–5.81)
RDW: 16.4 % — ABNORMAL HIGH (ref 11.5–15.5)
WBC: 13.4 10*3/uL — ABNORMAL HIGH (ref 4.0–10.5)

## 2016-03-04 LAB — HEPARIN LEVEL (UNFRACTIONATED)
HEPARIN UNFRACTIONATED: 0.24 [IU]/mL — AB (ref 0.30–0.70)
Heparin Unfractionated: 0.26 IU/mL — ABNORMAL LOW (ref 0.30–0.70)
Heparin Unfractionated: <0.1 IU/mL — ABNORMAL LOW (ref 0.30–0.70)

## 2016-03-04 LAB — PROTIME-INR
INR: 1.42 (ref 0.00–1.49)
Prothrombin Time: 17.4 seconds — ABNORMAL HIGH (ref 11.6–15.2)

## 2016-03-05 LAB — CBC
HEMATOCRIT: 28.6 % — AB (ref 39.0–52.0)
HEMOGLOBIN: 8.6 g/dL — AB (ref 13.0–17.0)
MCH: 24.8 pg — AB (ref 26.0–34.0)
MCHC: 30.1 g/dL (ref 30.0–36.0)
MCV: 82.4 fL (ref 78.0–100.0)
Platelets: 483 10*3/uL — ABNORMAL HIGH (ref 150–400)
RBC: 3.47 MIL/uL — ABNORMAL LOW (ref 4.22–5.81)
RDW: 16.5 % — ABNORMAL HIGH (ref 11.5–15.5)
WBC: 11.1 10*3/uL — ABNORMAL HIGH (ref 4.0–10.5)

## 2016-03-05 LAB — PROTIME-INR
INR: 1.39 (ref 0.00–1.49)
Prothrombin Time: 17.2 seconds — ABNORMAL HIGH (ref 11.6–15.2)

## 2016-03-05 LAB — HEPARIN LEVEL (UNFRACTIONATED)
HEPARIN UNFRACTIONATED: 0.62 [IU]/mL (ref 0.30–0.70)
HEPARIN UNFRACTIONATED: 0.48 [IU]/mL (ref 0.30–0.70)
HEPARIN UNFRACTIONATED: 0.35 [IU]/mL (ref 0.30–0.70)

## 2016-03-06 DIAGNOSIS — R1084 Generalized abdominal pain: Secondary | ICD-10-CM | POA: Insufficient documentation

## 2016-03-06 DIAGNOSIS — I255 Ischemic cardiomyopathy: Secondary | ICD-10-CM

## 2016-03-06 DIAGNOSIS — D649 Anemia, unspecified: Secondary | ICD-10-CM | POA: Insufficient documentation

## 2016-03-06 LAB — C DIFFICILE QUICK SCREEN W PCR REFLEX
C DIFFICILE (CDIFF) TOXIN: NEGATIVE
C DIFFICLE (CDIFF) ANTIGEN: NEGATIVE
C Diff interpretation: NEGATIVE

## 2016-03-06 LAB — HEPARIN LEVEL (UNFRACTIONATED)
HEPARIN UNFRACTIONATED: 0.47 [IU]/mL (ref 0.30–0.70)
Heparin Unfractionated: 0.29 IU/mL — ABNORMAL LOW (ref 0.30–0.70)
Heparin Unfractionated: <0.1 IU/mL — ABNORMAL LOW (ref 0.30–0.70)

## 2016-03-06 LAB — CBC
HEMATOCRIT: 26.6 % — AB (ref 39.0–52.0)
HEMOGLOBIN: 8.3 g/dL — AB (ref 13.0–17.0)
MCH: 25.4 pg — ABNORMAL LOW (ref 26.0–34.0)
MCHC: 31.2 g/dL (ref 30.0–36.0)
MCV: 81.3 fL (ref 78.0–100.0)
Platelets: 469 10*3/uL — ABNORMAL HIGH (ref 150–400)
RBC: 3.27 MIL/uL — ABNORMAL LOW (ref 4.22–5.81)
RDW: 16.2 % — ABNORMAL HIGH (ref 11.5–15.5)
WBC: 12.9 10*3/uL — AB (ref 4.0–10.5)

## 2016-03-06 LAB — PROTIME-INR
INR: 1.44 (ref 0.00–1.49)
PROTHROMBIN TIME: 17.7 s — AB (ref 11.6–15.2)

## 2016-03-07 LAB — COMPREHENSIVE METABOLIC PANEL
ALK PHOS: 40 U/L (ref 38–126)
ALT: 18 U/L (ref 17–63)
ANION GAP: 10 (ref 5–15)
AST: 23 U/L (ref 15–41)
Albumin: 2 g/dL — ABNORMAL LOW (ref 3.5–5.0)
BILIRUBIN TOTAL: 0.3 mg/dL (ref 0.3–1.2)
BUN: 16 mg/dL (ref 6–20)
CALCIUM: 9 mg/dL (ref 8.9–10.3)
CO2: 27 mmol/L (ref 22–32)
CREATININE: 1.09 mg/dL (ref 0.61–1.24)
Chloride: 104 mmol/L (ref 101–111)
Glucose, Bld: 112 mg/dL — ABNORMAL HIGH (ref 65–99)
Potassium: 4.2 mmol/L (ref 3.5–5.1)
Sodium: 141 mmol/L (ref 135–145)
TOTAL PROTEIN: 5.6 g/dL — AB (ref 6.5–8.1)

## 2016-03-07 LAB — CBC WITH DIFFERENTIAL/PLATELET
BASOS PCT: 0 %
Basophils Absolute: 0 10*3/uL (ref 0.0–0.1)
EOS PCT: 2 %
Eosinophils Absolute: 0.2 10*3/uL (ref 0.0–0.7)
HEMATOCRIT: 27.8 % — AB (ref 39.0–52.0)
HEMOGLOBIN: 8.9 g/dL — AB (ref 13.0–17.0)
LYMPHS PCT: 21 %
Lymphs Abs: 2.5 10*3/uL (ref 0.7–4.0)
MCH: 26.2 pg (ref 26.0–34.0)
MCHC: 32 g/dL (ref 30.0–36.0)
MCV: 81.8 fL (ref 78.0–100.0)
Monocytes Absolute: 0.8 10*3/uL (ref 0.1–1.0)
Monocytes Relative: 7 %
NEUTROS ABS: 8.4 10*3/uL — AB (ref 1.7–7.7)
NEUTROS PCT: 70 %
Platelets: 500 10*3/uL — ABNORMAL HIGH (ref 150–400)
RBC: 3.4 MIL/uL — ABNORMAL LOW (ref 4.22–5.81)
RDW: 16.3 % — ABNORMAL HIGH (ref 11.5–15.5)
WBC: 11.9 10*3/uL — ABNORMAL HIGH (ref 4.0–10.5)

## 2016-03-07 LAB — HEPARIN LEVEL (UNFRACTIONATED)
HEPARIN UNFRACTIONATED: 0.45 [IU]/mL (ref 0.30–0.70)
Heparin Unfractionated: 0.37 IU/mL (ref 0.30–0.70)
Heparin Unfractionated: 0.32 IU/mL (ref 0.30–0.70)

## 2016-03-07 LAB — DIGOXIN LEVEL: Digoxin Level: 1.2 ng/mL (ref 0.8–2.0)

## 2016-03-07 LAB — PROTIME-INR
INR: 1.7 — ABNORMAL HIGH (ref 0.00–1.49)
PROTHROMBIN TIME: 20 s — AB (ref 11.6–15.2)

## 2016-03-08 LAB — HEPARIN LEVEL (UNFRACTIONATED)
HEPARIN UNFRACTIONATED: 0.35 [IU]/mL (ref 0.30–0.70)
HEPARIN UNFRACTIONATED: 0.25 [IU]/mL — AB (ref 0.30–0.70)
HEPARIN UNFRACTIONATED: 0.34 [IU]/mL (ref 0.30–0.70)

## 2016-03-08 LAB — PROTIME-INR
INR: 2.47 — ABNORMAL HIGH (ref 0.00–1.49)
PROTHROMBIN TIME: 26.4 s — AB (ref 11.6–15.2)

## 2016-03-09 LAB — CBC WITH DIFFERENTIAL/PLATELET
BASOS ABS: 0 10*3/uL (ref 0.0–0.1)
Basophils Relative: 0 %
EOS ABS: 0.2 10*3/uL (ref 0.0–0.7)
EOS PCT: 2 %
HCT: 29.5 % — ABNORMAL LOW (ref 39.0–52.0)
Hemoglobin: 9 g/dL — ABNORMAL LOW (ref 13.0–17.0)
LYMPHS ABS: 2.9 10*3/uL (ref 0.7–4.0)
LYMPHS PCT: 29 %
MCH: 24.8 pg — AB (ref 26.0–34.0)
MCHC: 30.5 g/dL (ref 30.0–36.0)
MCV: 81.3 fL (ref 78.0–100.0)
MONO ABS: 0.8 10*3/uL (ref 0.1–1.0)
Monocytes Relative: 8 %
Neutro Abs: 6.1 10*3/uL (ref 1.7–7.7)
Neutrophils Relative %: 61 %
PLATELETS: 449 10*3/uL — AB (ref 150–400)
RBC: 3.63 MIL/uL — ABNORMAL LOW (ref 4.22–5.81)
RDW: 15.9 % — AB (ref 11.5–15.5)
WBC: 10.1 10*3/uL (ref 4.0–10.5)

## 2016-03-09 LAB — BASIC METABOLIC PANEL
Anion gap: 7 (ref 5–15)
BUN: 19 mg/dL (ref 6–20)
CALCIUM: 8.9 mg/dL (ref 8.9–10.3)
CO2: 27 mmol/L (ref 22–32)
Chloride: 103 mmol/L (ref 101–111)
Creatinine, Ser: 1.22 mg/dL (ref 0.61–1.24)
GFR calc Af Amer: >60 mL/min (ref >60)
GFR, EST NON AFRICAN AMERICAN: 59 mL/min — AB (ref >60)
GLUCOSE: 109 mg/dL — AB (ref 65–99)
Potassium: 3.7 mmol/L (ref 3.5–5.1)
SODIUM: 137 mmol/L (ref 135–145)

## 2016-03-09 LAB — PROTIME-INR
INR: 2.94 — AB (ref 0.00–1.49)
Prothrombin Time: 30.2 seconds — ABNORMAL HIGH (ref 11.6–15.2)

## 2016-03-09 LAB — HEPARIN LEVEL (UNFRACTIONATED)
Heparin Unfractionated: 0.24 IU/mL — ABNORMAL LOW (ref 0.30–0.70)
Heparin Unfractionated: 0.48 IU/mL (ref 0.30–0.70)

## 2016-03-10 LAB — PROTIME-INR
INR: 3.46 — AB (ref 0.00–1.49)
Prothrombin Time: 34.1 seconds — ABNORMAL HIGH (ref 11.6–15.2)

## 2016-03-11 LAB — BASIC METABOLIC PANEL
ANION GAP: 12 (ref 5–15)
BUN: 20 mg/dL (ref 6–20)
CALCIUM: 9.4 mg/dL (ref 8.9–10.3)
CO2: 28 mmol/L (ref 22–32)
Chloride: 103 mmol/L (ref 101–111)
Creatinine, Ser: 1.4 mg/dL — ABNORMAL HIGH (ref 0.61–1.24)
GFR calc Af Amer: 58 mL/min — ABNORMAL LOW (ref >60)
GFR calc non Af Amer: 50 mL/min — ABNORMAL LOW (ref >60)
GLUCOSE: 112 mg/dL — AB (ref 65–99)
Potassium: 3.6 mmol/L (ref 3.5–5.1)
Sodium: 143 mmol/L (ref 135–145)

## 2016-03-11 LAB — CBC WITH DIFFERENTIAL/PLATELET
BASOS ABS: 0.1 10*3/uL (ref 0.0–0.1)
Basophils Relative: 1 %
Eosinophils Absolute: 0.2 10*3/uL (ref 0.0–0.7)
Eosinophils Relative: 2 %
HEMATOCRIT: 33.8 % — AB (ref 39.0–52.0)
Hemoglobin: 10.4 g/dL — ABNORMAL LOW (ref 13.0–17.0)
LYMPHS PCT: 20 %
Lymphs Abs: 2.1 10*3/uL (ref 0.7–4.0)
MCH: 25.3 pg — ABNORMAL LOW (ref 26.0–34.0)
MCHC: 30.8 g/dL (ref 30.0–36.0)
MCV: 82.2 fL (ref 78.0–100.0)
MONOS PCT: 8 %
Monocytes Absolute: 0.8 10*3/uL (ref 0.1–1.0)
NEUTROS ABS: 7.5 10*3/uL (ref 1.7–7.7)
NEUTROS PCT: 69 %
Platelets: 473 10*3/uL — ABNORMAL HIGH (ref 150–400)
RBC: 4.11 MIL/uL — ABNORMAL LOW (ref 4.22–5.81)
RDW: 16 % — AB (ref 11.5–15.5)
WBC: 10.6 10*3/uL — ABNORMAL HIGH (ref 4.0–10.5)

## 2016-03-11 LAB — PROTIME-INR
INR: 3.35 — ABNORMAL HIGH (ref 0.00–1.49)
PROTHROMBIN TIME: 33.3 s — AB (ref 11.6–15.2)

## 2016-03-11 LAB — DIGOXIN LEVEL: Digoxin Level: 1.9 ng/mL (ref 0.8–2.0)

## 2016-03-12 LAB — RENAL FUNCTION PANEL
ANION GAP: 10 (ref 5–15)
Albumin: 2.4 g/dL — ABNORMAL LOW (ref 3.5–5.0)
BUN: 21 mg/dL — ABNORMAL HIGH (ref 6–20)
CALCIUM: 9.1 mg/dL (ref 8.9–10.3)
CO2: 29 mmol/L (ref 22–32)
Chloride: 104 mmol/L (ref 101–111)
Creatinine, Ser: 1.42 mg/dL — ABNORMAL HIGH (ref 0.61–1.24)
GFR, EST AFRICAN AMERICAN: 57 mL/min — AB (ref >60)
GFR, EST NON AFRICAN AMERICAN: 49 mL/min — AB (ref >60)
Glucose, Bld: 116 mg/dL — ABNORMAL HIGH (ref 65–99)
PHOSPHORUS: 4.3 mg/dL (ref 2.5–4.6)
Potassium: 3.9 mmol/L (ref 3.5–5.1)
SODIUM: 143 mmol/L (ref 135–145)

## 2016-03-12 LAB — MAGNESIUM: MAGNESIUM: 1.8 mg/dL (ref 1.7–2.4)

## 2016-03-12 LAB — DIGOXIN LEVEL
DIGOXIN LVL: 3 ng/mL — AB (ref 0.8–2.0)
Digoxin Level: 3.5 ng/mL (ref 0.8–2.0)

## 2016-03-12 LAB — PROTIME-INR
INR: 4.09 — ABNORMAL HIGH (ref 0.00–1.49)
Prothrombin Time: 38.6 seconds — ABNORMAL HIGH (ref 11.6–15.2)

## 2016-03-12 LAB — PHOSPHORUS: PHOSPHORUS: 4.3 mg/dL (ref 2.5–4.6)

## 2016-03-13 LAB — CBC WITH DIFFERENTIAL/PLATELET
BASOS ABS: 0 10*3/uL (ref 0.0–0.1)
BASOS PCT: 0 %
EOS PCT: 2 %
Eosinophils Absolute: 0.2 10*3/uL (ref 0.0–0.7)
HEMATOCRIT: 32 % — AB (ref 39.0–52.0)
Hemoglobin: 10 g/dL — ABNORMAL LOW (ref 13.0–17.0)
LYMPHS PCT: 14 %
Lymphs Abs: 1.5 10*3/uL (ref 0.7–4.0)
MCH: 25.8 pg — AB (ref 26.0–34.0)
MCHC: 31.3 g/dL (ref 30.0–36.0)
MCV: 82.5 fL (ref 78.0–100.0)
Monocytes Absolute: 1 10*3/uL (ref 0.1–1.0)
Monocytes Relative: 10 %
Neutro Abs: 7.5 10*3/uL (ref 1.7–7.7)
Neutrophils Relative %: 73 %
PLATELETS: 374 10*3/uL (ref 150–400)
RBC: 3.88 MIL/uL — ABNORMAL LOW (ref 4.22–5.81)
RDW: 16.1 % — AB (ref 11.5–15.5)
WBC: 10.3 10*3/uL (ref 4.0–10.5)

## 2016-03-13 LAB — RENAL FUNCTION PANEL
ANION GAP: 10 (ref 5–15)
Albumin: 2.5 g/dL — ABNORMAL LOW (ref 3.5–5.0)
BUN: 25 mg/dL — ABNORMAL HIGH (ref 6–20)
CALCIUM: 9.2 mg/dL (ref 8.9–10.3)
CHLORIDE: 104 mmol/L (ref 101–111)
CO2: 31 mmol/L (ref 22–32)
Creatinine, Ser: 1.47 mg/dL — ABNORMAL HIGH (ref 0.61–1.24)
GFR calc non Af Amer: 47 mL/min — ABNORMAL LOW (ref >60)
GFR, EST AFRICAN AMERICAN: 55 mL/min — AB (ref >60)
Glucose, Bld: 98 mg/dL (ref 65–99)
POTASSIUM: 3.6 mmol/L (ref 3.5–5.1)
Phosphorus: 4.3 mg/dL (ref 2.5–4.6)
Sodium: 145 mmol/L (ref 135–145)

## 2016-03-13 LAB — MAGNESIUM: Magnesium: 1.9 mg/dL (ref 1.7–2.4)

## 2016-03-13 LAB — DIGOXIN LEVEL: Digoxin Level: 2.7 ng/mL (ref 0.8–2.0)

## 2016-03-13 LAB — PROTIME-INR
INR: 3.58 — AB (ref 0.00–1.49)
PROTHROMBIN TIME: 35 s — AB (ref 11.6–15.2)

## 2016-03-14 LAB — RENAL FUNCTION PANEL
ANION GAP: 12 (ref 5–15)
Albumin: 2.5 g/dL — ABNORMAL LOW (ref 3.5–5.0)
BUN: 25 mg/dL — ABNORMAL HIGH (ref 6–20)
CHLORIDE: 104 mmol/L (ref 101–111)
CO2: 29 mmol/L (ref 22–32)
Calcium: 9.1 mg/dL (ref 8.9–10.3)
Creatinine, Ser: 1.39 mg/dL — ABNORMAL HIGH (ref 0.61–1.24)
GFR calc Af Amer: 59 mL/min — ABNORMAL LOW (ref >60)
GFR calc non Af Amer: 51 mL/min — ABNORMAL LOW (ref >60)
GLUCOSE: 91 mg/dL (ref 65–99)
POTASSIUM: 3.4 mmol/L — AB (ref 3.5–5.1)
Phosphorus: 4.2 mg/dL (ref 2.5–4.6)
Sodium: 145 mmol/L (ref 135–145)

## 2016-03-14 LAB — CBC WITH DIFFERENTIAL/PLATELET
BASOS ABS: 0.1 10*3/uL (ref 0.0–0.1)
Basophils Relative: 1 %
EOS PCT: 3 %
Eosinophils Absolute: 0.3 10*3/uL (ref 0.0–0.7)
HCT: 34.5 % — ABNORMAL LOW (ref 39.0–52.0)
Hemoglobin: 10.5 g/dL — ABNORMAL LOW (ref 13.0–17.0)
LYMPHS ABS: 2.4 10*3/uL (ref 0.7–4.0)
LYMPHS PCT: 23 %
MCH: 24.6 pg — AB (ref 26.0–34.0)
MCHC: 30.4 g/dL (ref 30.0–36.0)
MCV: 81 fL (ref 78.0–100.0)
MONO ABS: 0.8 10*3/uL (ref 0.1–1.0)
MONOS PCT: 7 %
Neutro Abs: 7.1 10*3/uL (ref 1.7–7.7)
Neutrophils Relative %: 66 %
PLATELETS: 409 10*3/uL — AB (ref 150–400)
RBC: 4.26 MIL/uL (ref 4.22–5.81)
RDW: 16 % — AB (ref 11.5–15.5)
WBC: 10.6 10*3/uL — ABNORMAL HIGH (ref 4.0–10.5)

## 2016-03-14 LAB — PROTIME-INR
INR: 3.25 — AB (ref 0.00–1.49)
Prothrombin Time: 32.5 seconds — ABNORMAL HIGH (ref 11.6–15.2)

## 2016-03-14 LAB — DIGOXIN LEVEL: Digoxin Level: 1.6 ng/mL (ref 0.8–2.0)

## 2016-03-14 LAB — MAGNESIUM: Magnesium: 1.8 mg/dL (ref 1.7–2.4)

## 2016-03-15 DIAGNOSIS — F32A Depression, unspecified: Secondary | ICD-10-CM | POA: Insufficient documentation

## 2016-03-15 DIAGNOSIS — R350 Frequency of micturition: Secondary | ICD-10-CM | POA: Insufficient documentation

## 2016-03-15 DIAGNOSIS — Q828 Other specified congenital malformations of skin: Secondary | ICD-10-CM | POA: Insufficient documentation

## 2016-03-15 DIAGNOSIS — L821 Other seborrheic keratosis: Secondary | ICD-10-CM | POA: Insufficient documentation

## 2016-03-15 DIAGNOSIS — L820 Inflamed seborrheic keratosis: Secondary | ICD-10-CM | POA: Insufficient documentation

## 2016-03-15 DIAGNOSIS — D239 Other benign neoplasm of skin, unspecified: Secondary | ICD-10-CM | POA: Insufficient documentation

## 2016-03-15 DIAGNOSIS — I2589 Other forms of chronic ischemic heart disease: Secondary | ICD-10-CM

## 2016-03-15 DIAGNOSIS — F329 Major depressive disorder, single episode, unspecified: Secondary | ICD-10-CM | POA: Insufficient documentation

## 2016-03-15 DIAGNOSIS — R351 Nocturia: Secondary | ICD-10-CM | POA: Insufficient documentation

## 2016-03-15 LAB — PROTIME-INR
INR: 2.97 — AB (ref 0.00–1.49)
Prothrombin Time: 30.4 seconds — ABNORMAL HIGH (ref 11.6–15.2)

## 2016-03-16 LAB — PROTIME-INR
INR: 2.94 — AB (ref 0.00–1.49)
PROTHROMBIN TIME: 30.1 s — AB (ref 11.6–15.2)

## 2016-03-17 ENCOUNTER — Telehealth: Payer: Self-pay | Admitting: Cardiology

## 2016-03-17 NOTE — Telephone Encounter (Signed)
New Message  Pt requested to speak w/ RN concerning recent hospital stay. Please call back and discuss.

## 2016-03-17 NOTE — Telephone Encounter (Signed)
Returned call to patient spoke to wife.Post hospital appointment scheduled with Dr.Jordan 03/28/16 at 10:30 am.

## 2016-03-17 NOTE — Telephone Encounter (Signed)
Pt calling  Back said there was bad connection

## 2016-03-17 NOTE — Telephone Encounter (Signed)
Pt stated recently d/c from hospital and needs f/u appt with Dr. Martinique within the next 2 weeks. Pt offered PA or NP stated would preferr to see Dr. Martinique.  Told pt would forward to his nurse and she will contact him once she is able to work something out in his Md schedule

## 2016-03-24 ENCOUNTER — Telehealth: Payer: Self-pay | Admitting: Cardiology

## 2016-03-24 NOTE — Telephone Encounter (Signed)
Received records from [facility] for appointment on 03/28/16 with Dr Martinique.  Records given to [facility] (medical records) for Dr Doug Sou schedule on 03/28/16. lp

## 2016-03-28 ENCOUNTER — Encounter: Payer: Self-pay | Admitting: Cardiology

## 2016-03-28 ENCOUNTER — Ambulatory Visit (INDEPENDENT_AMBULATORY_CARE_PROVIDER_SITE_OTHER): Payer: Medicare Other | Admitting: Cardiology

## 2016-03-28 VITALS — BP 120/60 | HR 102 | Ht 70.0 in | Wt 202.0 lb

## 2016-03-28 DIAGNOSIS — Z95810 Presence of automatic (implantable) cardiac defibrillator: Secondary | ICD-10-CM | POA: Diagnosis not present

## 2016-03-28 DIAGNOSIS — I482 Chronic atrial fibrillation, unspecified: Secondary | ICD-10-CM

## 2016-03-28 DIAGNOSIS — I2510 Atherosclerotic heart disease of native coronary artery without angina pectoris: Secondary | ICD-10-CM

## 2016-03-28 DIAGNOSIS — I5022 Chronic systolic (congestive) heart failure: Secondary | ICD-10-CM | POA: Diagnosis not present

## 2016-03-28 DIAGNOSIS — Z7901 Long term (current) use of anticoagulants: Secondary | ICD-10-CM

## 2016-03-28 DIAGNOSIS — Z9861 Coronary angioplasty status: Secondary | ICD-10-CM

## 2016-03-28 DIAGNOSIS — K265 Chronic or unspecified duodenal ulcer with perforation: Secondary | ICD-10-CM

## 2016-03-28 MED ORDER — METOPROLOL TARTRATE 25 MG PO TABS: 25.0000 mg | ORAL_TABLET | Freq: Two times a day (BID) | ORAL | Status: DC

## 2016-03-28 MED ORDER — LOSARTAN POTASSIUM 25 MG PO TABS: 25.0000 mg | ORAL_TABLET | Freq: Every day | ORAL | Status: DC

## 2016-03-28 MED ORDER — PRAVASTATIN SODIUM 40 MG PO TABS: ORAL_TABLET | ORAL | Status: DC

## 2016-03-28 NOTE — Patient Instructions (Signed)
Resume losartan 25 mg daily  Increase metoprolol to 25 mg twice a day  Continue your other medications. Do not take Coreg ( carvedilol)   I will follow up in 6 weeks

## 2016-03-28 NOTE — Progress Notes (Signed)
[name] Date of Birth: 1946-03-01 Medical Record [id number]  History of Present Illness: [name] is seen today for followup of atrial fibrillation and CAD.  He has a history of remote anterior myocardial infarction with an ischemic cardiomyopathy and ejection fraction of 23%. He is status post CRT D. He had a generator replacement in September 2014. He also has a history of atrial fibrillation. He is intolerant of amiodarone. His QT interval is too long for Tikosyn. He previously had attempted to increase his carvedilol dose but he developed significant hypotension.  Recently he was admitted from 3/2-3/15/17 with a perforated duodenal ulcer with GI bleed. He required surgery with Phillip Heal patch. He was transfused. He actually did quite well from a cardiac standpoint. Some of his cardiac meds were held due to hypotension. He also had bilateral distal DVTs. He was discharged to [facility] and discharged after about 8 days. In reviewing his discharge medications from the hospital and [facility] there are several discrepancies. He is doing well. Appetite is improving. His weight is down 23 lbs from pre op. No edema, chest pain, or palpitations. Mild dyspnea on exertion.     Medication List       This list is accurate as of: 03/28/16  5:40 PM.  Always use your most recent med list.               DIGOX 0.125 MG tablet  Generic drug:  digoxin  Take one-half tablet by  mouth daily     fenofibrate 160 MG tablet  Take 1 tablet by mouth  daily     furosemide 20 MG tablet  Commonly known as:  LASIX  Take 1 tablet (20 mg total) by mouth daily.     losartan 25 MG tablet  Commonly known as:  COZAAR  Take 1 tablet (25 mg total) by mouth daily.     metoprolol tartrate 25 MG tablet  Commonly known as:  LOPRESSOR  Take 1 tablet (25 mg total) by mouth 2 (two) times daily.     pantoprazole 40 MG tablet  Commonly known as:  PROTONIX  Take 2 tablets (80 mg total) by mouth 2 (two) times  daily.     pravastatin 40 MG tablet  Commonly known as:  PRAVACHOL  Take 1 tablet by mouth  daily     sertraline 100 MG tablet  Commonly known as:  ZOLOFT  Take 100 mg by mouth daily.     warfarin 5 MG tablet  Commonly known as:  COUMADIN  Take 0.5 tablets (2.5 mg total) by mouth daily at 6 PM.         Allergies  Allergen Reactions  . Keflex [Cephalexin] Shortness Of Breath and Other (See Comments)    dizziness    Past Medical History  Diagnosis Date  . Coronary artery disease   . MI (myocardial infarction) ([place]) 2000    ANTERIOR, s/p PCI  . Ischemic cardiomyopathy     EF 23%  . Persistent atrial fibrillation (HCC)     on coumadin  . Chronic renal insufficiency   . Dyslipidemia   . Depression   . ED (erectile dysfunction)   . Other primary cardiomyopathies   . Presence of permanent cardiac pacemaker   . AICD (automatic cardioverter/defibrillator) present   . Dysrhythmia     atrial fibrillation  . CHF (congestive heart failure) ([place])   . Shortness of breath dyspnea     on exertion    Past Surgical  History  Procedure Laterality Date  . Cardiac catheterization  02/05/2008    MODERATE TO SEVERE LEFT VENTRICULAR  DYSFUNCTION WITH EF 25-30%  . Coronary stent placement      LAD  . Transthoracic echocardiogram  12/2010    EF 30-35%  . Cardiac defibrillator placement  2009    MDT BI-VENTRICULAR ICD by Dr Caryl Comes  . Wrist fracture surgery    . Breast lumpectomy    . Implantable cardioverter defibrillator generator change  2014  . Biv icd genertaor change out N/A 09/16/2013    Procedure: BIV ICD GENERTAOR CHANGE OUT;  Surgeon: [name], MD;  Location: [facility] CATH LAB;  Service: Cardiovascular;  Laterality: N/A;  . Insert / replace / remove pacemaker      ICD  . Coronary angioplasty  2000  . Cystoscopy N/A 01/25/2016    Procedure: CYSTOSCOPY;  Surgeon: Carolan Clines, MD;  Location: [facility] ORS;  Service: Urology;  Laterality: N/A;  . Transurethral resection of  prostate N/A 01/25/2016    Procedure: TRANSURETHRAL RESECTION OF THE PROSTATE (TURP);  Surgeon: Carolan Clines, MD;  Location: [facility] ORS;  Service: Urology;  Laterality: N/A;  . Laparotomy N/A 02/22/2016    Procedure: EXPLORATORY LAPAROTOMY WITH  PATCH OF DUODENAL ULCER;  Surgeon: Rolm Bookbinder, MD;  Location: [facility];  Service: General;  Laterality: N/A;    History  Smoking status  . Former Smoker  . Types: Cigarettes  . Quit date: 12/23/1998  Smokeless tobacco  . Not on file    History  Alcohol Use No    Family History  Problem Relation Age of Onset  . Breast cancer Mother   . Depression Mother   . Heart attack Father   . Breast cancer Sister     Review of Systems: As noted in history of present illness. All other systems were reviewed and are negative.  Physical Exam: BP 120/60 mmHg  Pulse 102  Ht 5\' 10"  (1.778 m)  Wt 91.627 kg (202 lb)  BMI 28.98 kg/m2 He is a pleasant white male in no acute distress. HEENT: Unremarkable No JVD or bruits. Lungs: Clear Cardiovascular: Regular rate and rhythm, normal S1 and S2. Grade 2/6 systolic murmur at apex. Abdomen: Soft, nontender. No masses or bruits. Extremities: Trace edema below the knees. Large venous varicosities. Skin: Warm and dry Neuro: Alert oriented x3. Cranial nerves II through XII are intact.  LABORATORY DATA: Ecg  02/22/16 shows ventricular paced rhythm with occ. PVCs. Rate 101 bpm. Underlying Afib. I have personally reviewed and interpreted this study.  Lab Results  Component Value Date   WBC 10.6* 03/14/2016   HGB 10.5* 03/14/2016   HCT 34.5* 03/14/2016   PLT 409* 03/14/2016   GLUCOSE 91 03/14/2016   CHOL 112 02/27/2016   TRIG 53 02/29/2016   HDL 32* 02/27/2016   LDLDIRECT 137.6 01/06/2012   LDLCALC 66 02/27/2016   ALT 18 03/07/2016   AST 23 03/07/2016   NA 145 03/14/2016   K 3.4* 03/14/2016   CL 104 03/14/2016   CREATININE 1.39* 03/14/2016   BUN 25* 03/14/2016   CO2 29 03/14/2016   TSH 3.082  02/18/2016   PSA 3.19 01/19/2016   INR 2.94* 03/16/2016     Assessment / Plan: 1. Atrial fibrillation. Rate control appears to be adequate. DC summary from the hospital had him on metoprolol and Coreg. Will continue with metoprolol alone but increase to 25 mg bid. On coumadin and INR checked by primary care.   2. Coronary disease  with remote anterior myocardial infarction. He is asymptomatic.  3. Chronic systolic congestive heart failure. He is well compensated. Medications reduced in hospital. Will resume losartan 25 mg daily. Continue metoprolol and lasix. Hold eplerenone for now.  Further titration as BP tolerates.  Ejection fraction is 20-25% in January 2017.  Continue sodium restriction.   4. Recent perforated duodenal ulcer and bleeding. S/p surgery. Recovering well. Continue high dose PPI.  5.  S/p CRT-D. Followed in device clinic.   6. Bilateral DVT- right soleal, left posterial tibial and peroneal veins. On coumadin.   Medication reviewed and updated list as noted.  We will follow up in 6 weeks for reassessment and potential titration of CHF meds.   Jaydin Jalomo Martinique MD, [facility]  03/28/2016

## 2016-03-29 ENCOUNTER — Other Ambulatory Visit: Payer: Self-pay | Admitting: *Deleted

## 2016-03-29 NOTE — Telephone Encounter (Signed)
error 

## 2016-04-05 ENCOUNTER — Telehealth: Payer: Self-pay | Admitting: Cardiology

## 2016-04-05 NOTE — Telephone Encounter (Signed)
Agree with recommendation  Peter Jordan MD, FACC   

## 2016-04-05 NOTE — Telephone Encounter (Signed)
New Message  [facility] Nurse Rande Lawman called states that the [name] has had a 4 lb wt gain in 4 days. Stomach went from 108 to 112 cm and legs from 25-28 cm. Vital are stable. Simply wanted to send this report. [name] is walking 12 mile a day. The [name] is doing everything right not sure why he is gaining wt.

## 2016-04-05 NOTE — Telephone Encounter (Signed)
Pt of Dr. Martinique seen on 4/10  Returned RN call. Pt walking, avoiding salt as instructed, VS stable w/ no symptoms or problems other than a 4-5 lb weight gain over 4 days w abdominal girth & LE edema increase.  I advised 1 extra lasix (20mg ) daily x3 days, call if no improvement. [facility] RN will relay instructions to patient. She reports this is his last day of home health-- If further instructions, asked [place] to reach patient directly. His next f/u w [place] is 5/25 to see [name].  Routed to Dr. Martinique to advise.

## 2016-04-08 ENCOUNTER — Ambulatory Visit: Payer: Medicare Other | Admitting: Cardiology

## 2016-04-15 ENCOUNTER — Other Ambulatory Visit: Payer: Self-pay | Admitting: *Deleted

## 2016-04-15 MED ORDER — DIGOXIN 125 MCG PO TABS: ORAL_TABLET | ORAL | Status: DC

## 2016-04-22 ENCOUNTER — Other Ambulatory Visit: Payer: Self-pay

## 2016-04-22 MED ORDER — METOPROLOL TARTRATE 25 MG PO TABS: 25.0000 mg | ORAL_TABLET | Freq: Two times a day (BID) | ORAL | Status: DC

## 2016-04-25 ENCOUNTER — Other Ambulatory Visit: Payer: Self-pay | Admitting: *Deleted

## 2016-04-25 MED ORDER — METOPROLOL TARTRATE 25 MG PO TABS: 25.0000 mg | ORAL_TABLET | Freq: Two times a day (BID) | ORAL | Status: DC

## 2016-04-25 NOTE — Telephone Encounter (Signed)
REFILL 

## 2016-04-26 ENCOUNTER — Other Ambulatory Visit: Payer: Self-pay | Admitting: *Deleted

## 2016-04-26 MED ORDER — METOPROLOL TARTRATE 25 MG PO TABS: 25.0000 mg | ORAL_TABLET | Freq: Two times a day (BID) | ORAL | Status: DC

## 2016-05-05 ENCOUNTER — Encounter: Payer: Self-pay | Admitting: Internal Medicine

## 2016-05-05 ENCOUNTER — Encounter: Payer: Self-pay | Admitting: Cardiology

## 2016-05-05 ENCOUNTER — Telehealth: Payer: Self-pay | Admitting: Internal Medicine

## 2016-05-05 ENCOUNTER — Ambulatory Visit (INDEPENDENT_AMBULATORY_CARE_PROVIDER_SITE_OTHER): Payer: Medicare Other | Admitting: Internal Medicine

## 2016-05-05 VITALS — BP 110/50 | HR 86 | Ht 70.0 in | Wt 211.4 lb

## 2016-05-05 DIAGNOSIS — I255 Ischemic cardiomyopathy: Secondary | ICD-10-CM

## 2016-05-05 DIAGNOSIS — Z95810 Presence of automatic (implantable) cardiac defibrillator: Secondary | ICD-10-CM | POA: Diagnosis not present

## 2016-05-05 DIAGNOSIS — I5022 Chronic systolic (congestive) heart failure: Secondary | ICD-10-CM | POA: Diagnosis not present

## 2016-05-05 DIAGNOSIS — I480 Paroxysmal atrial fibrillation: Secondary | ICD-10-CM

## 2016-05-05 LAB — CBC WITH DIFFERENTIAL/PLATELET
BASOS ABS: 0 {cells}/uL (ref 0–200)
Basophils Relative: 0 %
EOS PCT: 1 %
Eosinophils Absolute: 89 cells/uL (ref 15–500)
HEMATOCRIT: 31.8 % — AB (ref 38.5–50.0)
HEMOGLOBIN: 9.6 g/dL — AB (ref 13.2–17.1)
LYMPHS ABS: 2581 {cells}/uL (ref 850–3900)
LYMPHS PCT: 29 %
MCH: 22.4 pg — AB (ref 27.0–33.0)
MCHC: 30.2 g/dL — AB (ref 32.0–36.0)
MCV: 74.3 fL — ABNORMAL LOW (ref 80.0–100.0)
MPV: 9.2 fL (ref 7.5–12.5)
Monocytes Absolute: 979 cells/uL — ABNORMAL HIGH (ref 200–950)
Monocytes Relative: 11 %
NEUTROS PCT: 59 %
Neutro Abs: 5251 cells/uL (ref 1500–7800)
Platelets: 418 10*3/uL — ABNORMAL HIGH (ref 140–400)
RBC: 4.28 MIL/uL (ref 4.20–5.80)
RDW: 16.6 % — ABNORMAL HIGH (ref 11.0–15.0)
WBC: 8.9 10*3/uL (ref 3.8–10.8)

## 2016-05-05 NOTE — Progress Notes (Signed)
Patient referred to [facility] clinic by Dr Caryl Comes.  Met patient and wife (DPR on file) in office for ICM clinic introduction and he agreed to monthly calls.  Transmission checked in the office showing fluid accumulation and patient having fluid symptoms.  1st ICM remote transmission scheduled for 05/11/2016 which is day before office visit with Tarri Fuller, PA.Marland Kitchen

## 2016-05-05 NOTE — Telephone Encounter (Signed)
New message      Pt was seen today.  Calling to tell the nurse he takes potassium 78meq

## 2016-05-05 NOTE — Patient Instructions (Addendum)
Medication Instructions: 1) Increase lasix (furosemide) to 40 mg twice daily x 5 days, then decrease to 40 mg once daily x 5 days, then resume your normal dosing  Labwork: - Your physician recommends that you return for lab work today: BMP/ CBC/ Digoxin  Procedures/Testing: - none  Follow-Up: - Your physician recommends that you  follow-up as scheduled: Thursday 05/12/16 at 11:00 am with Luisa Dago, PA- [facility] office (repeat BMP today)   - Remote monitoring is used to monitor your Pacemaker of ICD from home. This monitoring reduces the number of office visits required to check your device to one time per year. It allows [place] to keep an eye on the functioning of your device to ensure it is working properly. You are scheduled for a device check from home on 05/11/16- fluid check only. You may send your transmission at any time that day. If you have a wireless device, the transmission will be sent automatically. After your physician reviews your transmission, you will receive a postcard with your next transmission date.  - Your physician wants you to follow-up in: 1 year with Dr. Caryl Comes. You will receive a reminder letter in the mail two months in advance. If you don't receive a letter, please call our office to schedule the follow-up appointment.  Any Additional Special Instructions Will Be Listed Below (If Applicable).     If you need a refill on your cardiac medications before your next appointment, please call your pharmacy.

## 2016-05-05 NOTE — Progress Notes (Signed)
Patient Care Team: [name], MD as PCP - General (Family Medicine)   HPI  [name] is a 69 y.o. male Seen in followup of afib for which he recently saw Dr Greggory Brandy and the was decision to not pursue P VI He also has ichemic cardiomyopathy and HFrEF with prior CRT-D from which he garnered little benefit. CXR demonstrates good lead location  He struggles with depression   It is not clear that  he had his symptoms of dyspnea are related to his atrial fibrillation. Clearly he has no palpitations. Is also not certain that he can recall previously when his atrial fibrillation is paroxysmal that his symptoms were worse while in AF. He is now permanent   At the last visit after long discussions, we decided to pursue augmented rate control with the introduction of low-dose beta blockers.  The absence of significant symptoms we also decided that pursuing ablation either of the AV node or of the pulmonary veins was not appropriate.  Hospitalized 3/17 for GI bleed 2/2 ulcers requring surgery for perforation  now back on warfain  masivley edematous with increasing SOB  Last metabolic profile was [id number]. Dig level 0.8 on 12/15  Past Medical History  Diagnosis Date  . Coronary artery disease   . MI (myocardial infarction) ([place]) 2000    ANTERIOR, s/p PCI  . Ischemic cardiomyopathy     EF 23%  . Persistent atrial fibrillation (HCC)     on coumadin  . Chronic renal insufficiency   . Dyslipidemia   . Depression   . ED (erectile dysfunction)   . Other primary cardiomyopathies   . Presence of permanent cardiac pacemaker   . AICD (automatic cardioverter/defibrillator) present   . Dysrhythmia     atrial fibrillation  . CHF (congestive heart failure) ([place])   . Shortness of breath dyspnea     on exertion    Past Surgical History  Procedure Laterality Date  . Cardiac catheterization  02/05/2008    MODERATE TO SEVERE LEFT VENTRICULAR  DYSFUNCTION WITH EF 25-30%  . Coronary  stent placement      LAD  . Transthoracic echocardiogram  12/2010    EF 30-35%  . Cardiac defibrillator placement  2009    MDT BI-VENTRICULAR ICD by Dr Caryl Comes  . Wrist fracture surgery    . Breast lumpectomy    . Implantable cardioverter defibrillator generator change  2014  . Biv icd genertaor change out N/A 09/16/2013    Procedure: BIV ICD GENERTAOR CHANGE OUT;  Surgeon: [name], MD;  Location: [facility] CATH LAB;  Service: Cardiovascular;  Laterality: N/A;  . Insert / replace / remove pacemaker      ICD  . Coronary angioplasty  2000  . Cystoscopy N/A 01/25/2016    Procedure: CYSTOSCOPY;  Surgeon: Carolan Clines, MD;  Location: [facility];  Service: Urology;  Laterality: N/A;  . Transurethral resection of prostate N/A 01/25/2016    Procedure: TRANSURETHRAL RESECTION OF THE PROSTATE (TURP);  Surgeon: Carolan Clines, MD;  Location: [facility];  Service: Urology;  Laterality: N/A;  . Laparotomy N/A 02/22/2016    Procedure: EXPLORATORY LAPAROTOMY WITH  PATCH OF DUODENAL ULCER;  Surgeon: Rolm Bookbinder, MD;  Location: [facility];  Service: General;  Laterality: N/A;    Current Outpatient Prescriptions  Medication Sig Dispense Refill  . digoxin (DIGOX) 0.125 MG tablet Take one-half tablet by  mouth daily 7 tablet 0  . fenofibrate 160 MG tablet Take 1 tablet  by mouth  daily 90 tablet 2  . furosemide (LASIX) 20 MG tablet Take 1 tablet (20 mg total) by mouth daily. 30 tablet 1  . losartan (COZAAR) 25 MG tablet Take 1 tablet (25 mg total) by mouth daily. 90 tablet 3  . metoprolol succinate (TOPROL-XL) 25 MG 24 hr tablet Take 25 mg by mouth daily.    . metoprolol tartrate (LOPRESSOR) 25 MG tablet Take 1 tablet (25 mg total) by mouth 2 (two) times daily. 180 tablet 1  . mirabegron ER (MYRBETRIQ) 25 MG TB24 tablet Take 25 mg by mouth daily.    . pantoprazole (PROTONIX) 40 MG tablet Take 2 tablets (80 mg total) by mouth 2 (two) times daily.    . potassium chloride SA (K-DUR,KLOR-CON) 20 MEQ tablet Take 10  mEq by mouth daily.    . pravastatin (PRAVACHOL) 40 MG tablet Take 1 tablet by mouth  daily 90 tablet 1  . sertraline (ZOLOFT) 100 MG tablet Take 100 mg by mouth daily.      Marland Kitchen warfarin (COUMADIN) 5 MG tablet Take 0.5 tablets (2.5 mg total) by mouth daily at 6 PM. 30 tablet 0   No current facility-administered medications for this visit.    Allergies  Allergen Reactions  . Keflex [Cephalexin] Shortness Of Breath and Other (See Comments)    dizziness    Review of Systems negative except from HPI and PMH  Physical Exam BP 110/50 mmHg  Pulse 86  Ht 5\' 10"  (1.778 m)  Wt 211 lb 6.4 oz (95.89 kg)  BMI 30.33 kg/m2 Well developed and nourished in no acute distress HENT normal Neck supple with JVP-7  k and full without bruits Clear Irregularly irregular rate and rhythm with controlled ventricular response, no murmurs or gallops Abd-soft with active BS without hepatomegaly abd sscar No Clubbing cyanosis 3+ edema Skin-warm and dry A & Oriented  Grossly normal sensory and motor function  ECG Bipacing   Upright QRS V1  Assessment and  Plan  Atrial fibrillation  Congestive heart failure-chronic-systolic  Ischemic cardiomyopathy  Hypotension  CRT-D.  The patient's device was interrogated.  The information was reviewed. No changes were made in the programming.    Is permanent.  Depression  Irritability   Massive volume overload with pooorer rate control primary vs secondary??  Last K was low; not clear if he is ontreplacment  Will increase diuretic 20>>40BID x 5 d then 40 day  To see BH next week so can reassess and adjust diuretics  Will augment rate control by doubling metoprolol  He is not sure of the formulation he is taking   Will recheck CBC

## 2016-05-05 NOTE — Telephone Encounter (Signed)
I left a message for the patient to call back and clarify how he is taking potassium.

## 2016-05-06 ENCOUNTER — Telehealth: Payer: Self-pay

## 2016-05-06 DIAGNOSIS — E876 Hypokalemia: Secondary | ICD-10-CM

## 2016-05-06 LAB — BASIC METABOLIC PANEL
BUN: 17 mg/dL (ref 7–25)
CALCIUM: 8.8 mg/dL (ref 8.6–10.3)
CO2: 26 mmol/L (ref 20–31)
Chloride: 100 mmol/L (ref 98–110)
Creat: 1.22 mg/dL (ref 0.70–1.25)
GLUCOSE: 95 mg/dL (ref 65–99)
Potassium: 3.2 mmol/L — ABNORMAL LOW (ref 3.5–5.3)
SODIUM: 143 mmol/L (ref 135–146)

## 2016-05-06 LAB — DIGOXIN LEVEL: DIGOXIN LVL: 0.6 ug/L — AB (ref 0.8–2.0)

## 2016-05-06 MED ORDER — POTASSIUM CHLORIDE CRYS ER 20 MEQ PO TBCR: 20.0000 meq | EXTENDED_RELEASE_TABLET | Freq: Every day | ORAL | Status: DC

## 2016-05-06 NOTE — Telephone Encounter (Signed)
Patient's Potassium is low at 3.2. Reviewed labs with DOD, Dr. Irish Lack. Will have patient increase potassium to 20 meq by mouth daily. Patient will have a repeat BMET day before or day of his appointment with Tenny Craw PA on 05/11/16.

## 2016-05-09 LAB — CUP PACEART INCLINIC DEVICE CHECK
Battery Remaining Longevity: 48 mo
Brady Statistic RV Percent Paced: 61.14 %
HighPow Impedance: 42 Ohm
HighPow Impedance: 53 Ohm
Implantable Lead Implant Date: 20090226
Implantable Lead Implant Date: 20090226
Implantable Lead Location: 753858
Implantable Lead Location: 753860
Implantable Lead Model: 6947
Lead Channel Pacing Threshold Amplitude: 1 V
Lead Channel Sensing Intrinsic Amplitude: 10.625 mV
Lead Channel Setting Pacing Amplitude: 2 V
Lead Channel Setting Pacing Pulse Width: 0.6 ms
Lead Channel Setting Pacing Pulse Width: 0.6 ms
MDC IDC LEAD IMPLANT DT: 20090226
MDC IDC LEAD LOCATION: 753859
MDC IDC LEAD MODEL: 4194
MDC IDC MSMT BATTERY VOLTAGE: 2.95 V
MDC IDC MSMT LEADCHNL LV IMPEDANCE VALUE: 285 Ohm
MDC IDC MSMT LEADCHNL LV IMPEDANCE VALUE: 513 Ohm
MDC IDC MSMT LEADCHNL LV IMPEDANCE VALUE: 665 Ohm
MDC IDC MSMT LEADCHNL LV PACING THRESHOLD AMPLITUDE: 1 V
MDC IDC MSMT LEADCHNL LV PACING THRESHOLD PULSEWIDTH: 0.6 ms
MDC IDC MSMT LEADCHNL RA IMPEDANCE VALUE: 456 Ohm
MDC IDC MSMT LEADCHNL RA SENSING INTR AMPL: 1.5 mV
MDC IDC MSMT LEADCHNL RV IMPEDANCE VALUE: 285 Ohm
MDC IDC MSMT LEADCHNL RV IMPEDANCE VALUE: 342 Ohm
MDC IDC MSMT LEADCHNL RV PACING THRESHOLD PULSEWIDTH: 0.6 ms
MDC IDC SESS DTM: 20170518174744
MDC IDC SET LEADCHNL RV PACING AMPLITUDE: 2 V
MDC IDC SET LEADCHNL RV SENSING SENSITIVITY: 0.3 mV
MDC IDC STAT BRADY AP VP PERCENT: 0 %
MDC IDC STAT BRADY AP VS PERCENT: 0 %
MDC IDC STAT BRADY AS VP PERCENT: 55.41 %
MDC IDC STAT BRADY AS VS PERCENT: 44.59 %
MDC IDC STAT BRADY RA PERCENT PACED: 0 %

## 2016-05-11 ENCOUNTER — Ambulatory Visit (INDEPENDENT_AMBULATORY_CARE_PROVIDER_SITE_OTHER): Payer: Medicare Other

## 2016-05-11 DIAGNOSIS — I5022 Chronic systolic (congestive) heart failure: Secondary | ICD-10-CM

## 2016-05-11 DIAGNOSIS — Z95810 Presence of automatic (implantable) cardiac defibrillator: Secondary | ICD-10-CM

## 2016-05-11 NOTE — Progress Notes (Signed)
EPIC Encounter for ICM Monitoring  [name] is a 70 y.o. male Date: 05/11/2016 Primary Care Physican: [name], MD Primary Cardiologist: Martinique Electrophysiologist: Faustino Congress Weight: 200.6 lbs   Bi-V Pacing 60.4%      In the past month, have you:  1. Gained more than 2 pounds in a day or more than 5 pounds in a week? Yes, was 207.0 on 05/04/2016 and has lost 7 lbs in last week from taking extra Furosemide as ordered on 05/05/2016.    2. Had changes in your medications (with verification of current medications)? no  3. Had more shortness of breath than is usual for you? no  4. Limited your activity because of shortness of breath? no  5. Not been able to sleep because of shortness of breath? no  6. Had increased swelling in your feet, ankles, legs or stomach area? Yes, had leg swelling during office visit with Dr Caryl Comes on 05/05/2016 and reported swelling has decreased since increasing Furosemide to 40 mg bid x 5 days.   7. Had symptoms of dehydration (dizziness, dry mouth, increased thirst, decreased urine output) no  8. Had changes in sodium restriction? no  9. Been compliant with medication? Yes  ICM trend: 3 month view for 05/11/2016   ICM trend: 1 year view for 05/11/2016   Follow-up plan: ICM clinic phone appointment 05/19/2016.    FLUID LEVELS:  Optivol thoracic impedance decreased since 03/23/2016 suggesting fluid accumulation.  Patient has fluid accumulation noted at appointment with Dr Caryl Comes on 05/05/2016.   Thoracic impedance has not improved since Furosemide was increased on 05/05/2016.  SYMPTOMS:  Yes, continues to have lower extremity swelling despite increase of Furosemide to 40 mg bid x 5 days and then to 40 mg daily at office visit 05/05/2016.     EDUCATION:  Education was discussed at office visit on 05/05/2016 and advised to limit sodium intake to < 2000 mg and fluid intake.  RECOMMENDATIONS: No changes today due to patient has follow up  appointment with Tarri Fuller, PA on 05/12/2016.  Advised patient Gaspar Bidding would assessment him tomorrow and make changes to his medications if needed.  Repeat transmission 05/19/2016.  Advised will send to PCP, Dr Martinique, Dr Caryl Comes and Tarri Fuller, [name] for review.    [name], RN, CCM 05/11/2016 2:25 PM

## 2016-05-12 ENCOUNTER — Telehealth: Payer: Self-pay | Admitting: Cardiology

## 2016-05-12 ENCOUNTER — Encounter: Payer: Self-pay | Admitting: Physician Assistant

## 2016-05-12 ENCOUNTER — Ambulatory Visit (INDEPENDENT_AMBULATORY_CARE_PROVIDER_SITE_OTHER): Payer: Medicare Other | Admitting: Physician Assistant

## 2016-05-12 ENCOUNTER — Telehealth: Payer: Self-pay | Admitting: Physician Assistant

## 2016-05-12 VITALS — BP 82/50 | HR 80 | Ht 70.0 in | Wt 203.0 lb

## 2016-05-12 DIAGNOSIS — I10 Essential (primary) hypertension: Secondary | ICD-10-CM

## 2016-05-12 DIAGNOSIS — Z95810 Presence of automatic (implantable) cardiac defibrillator: Secondary | ICD-10-CM | POA: Diagnosis not present

## 2016-05-12 DIAGNOSIS — I255 Ischemic cardiomyopathy: Secondary | ICD-10-CM

## 2016-05-12 DIAGNOSIS — Z7901 Long term (current) use of anticoagulants: Secondary | ICD-10-CM

## 2016-05-12 DIAGNOSIS — Z9861 Coronary angioplasty status: Secondary | ICD-10-CM

## 2016-05-12 DIAGNOSIS — I482 Chronic atrial fibrillation, unspecified: Secondary | ICD-10-CM

## 2016-05-12 DIAGNOSIS — I5022 Chronic systolic (congestive) heart failure: Secondary | ICD-10-CM

## 2016-05-12 DIAGNOSIS — I2510 Atherosclerotic heart disease of native coronary artery without angina pectoris: Secondary | ICD-10-CM

## 2016-05-12 DIAGNOSIS — E785 Hyperlipidemia, unspecified: Secondary | ICD-10-CM

## 2016-05-12 LAB — BASIC METABOLIC PANEL
BUN: 22 mg/dL (ref 7–25)
CHLORIDE: 103 mmol/L (ref 98–110)
CO2: 32 mmol/L — ABNORMAL HIGH (ref 20–31)
Calcium: 9.4 mg/dL (ref 8.6–10.3)
Creat: 1.27 mg/dL — ABNORMAL HIGH (ref 0.70–1.25)
Glucose, Bld: 88 mg/dL (ref 65–99)
POTASSIUM: 3.5 mmol/L (ref 3.5–5.3)
SODIUM: 144 mmol/L (ref 135–146)

## 2016-05-12 NOTE — Telephone Encounter (Addendum)
LM for patient to call back regarding clarification of metoprolol succinate or metoprolol tartrate.  Patient saw B. Samara Snide, [place] 05/12/16 and both medications were on his list He was advised to call when he got home to provide [place] with accurate medication list

## 2016-05-12 NOTE — Progress Notes (Signed)
Patient ID: [name], male   DOB: 05/19/1946, 70 y.o.   MRN: [id number]    Date:  05/12/2016   ID:  [name], [name] Apr 02, 1946, MRN [id number]  PCP:  [name], MD  Primary Cardiologist:  [place]  Chief Complaint  Patient presents with  . Follow-up    dizziness with movement, swelling in legs     History of Present Illness: [name] is a 70 y.o. male history of atrial fibrillation and coronary artery disease. He has a history of remote anterior myocardial infarction with an ischemic cardiomyopathy and ejection fraction of 23%. He is status post CRT D. He had a generator replacement in September 2014. He also has a history of atrial fibrillation. He is intolerant of amiodarone. His QT interval is too long for Tikosyn. He previously had attempted to increase his carvedilol dose but he developed significant hypotension.  he was admitted from 3/2-3/15/17 with a perforated duodenal ulcer with GI bleed. He required surgery with Phillip Heal patch. He was transfused. He actually did quite well from a cardiac standpoint. Some of his cardiac meds were held due to hypotension. He also had bilateral distal DVTs. He was discharged to [place] and discharged after about 8 days.  She was seen by Dr. Caryl Comes on May 18 and increased his Lasix to 40 mg twice daily for 5 days then 40 mg daily thereafter.  Dr. Caryl Comes also increased his metoprolol. He had basic metabolic panel on the [id number] which showed a mildly low potassium. He is on supplements Basic metabolic panel is pending for today.  Patient is down 8 pounds since his previous visit. However, he was only taking 20 mg of Lasix twice a day instead of 40 he was instructed to.   Patient appears to be doing well. His weight is down on his scale as well but he still has considerable lower extremity edema. He is unable to wear compression socks because of size of his legs. He denies any orthopnea, PND or abdominal distention.  The patient currently  denies nausea, vomiting, fever, chest pain, shortness of breath, dizziness, cough, congestion, abdominal pain, hematochezia, melena, claudication.  Wt Readings from Last 3 Encounters:  05/12/16 203 lb (92.08 kg)  05/05/16 211 lb 6.4 oz (95.89 kg)  03/28/16 202 lb (91.627 kg)     Past Medical History  Diagnosis Date  . Coronary artery disease   . MI (myocardial infarction) ([place]) 2000    ANTERIOR, s/p PCI  . Ischemic cardiomyopathy     EF 23%  . Persistent atrial fibrillation (HCC)     on coumadin  . Chronic renal insufficiency   . Dyslipidemia   . Depression   . ED (erectile dysfunction)   . Other primary cardiomyopathies   . Presence of permanent cardiac pacemaker   . AICD (automatic cardioverter/defibrillator) present   . Dysrhythmia     atrial fibrillation  . CHF (congestive heart failure) ([place])   . Shortness of breath dyspnea     on exertion    Current Outpatient Prescriptions  Medication Sig Dispense Refill  . digoxin (DIGOX) 0.125 MG tablet Take one-half tablet by  mouth daily 7 tablet 0  . fenofibrate 160 MG tablet Take 1 tablet by mouth  daily 90 tablet 2  . furosemide (LASIX) 40 MG tablet Take 40 mg by mouth daily.    . metoprolol succinate (TOPROL-XL) 25 MG 24 hr tablet Take 25 mg by mouth daily.    . metoprolol tartrate (LOPRESSOR)  25 MG tablet Take 1 tablet (25 mg total) by mouth 2 (two) times daily. 180 tablet 1  . mirabegron ER (MYRBETRIQ) 25 MG TB24 tablet Take 25 mg by mouth daily.    . pantoprazole (PROTONIX) 40 MG tablet Take 2 tablets (80 mg total) by mouth 2 (two) times daily.    . potassium chloride SA (K-DUR,KLOR-CON) 20 MEQ tablet Take 1 tablet (20 mEq total) by mouth daily. 90 tablet 3  . potassium chloride SA (K-DUR,KLOR-CON) 20 MEQ tablet Take 40 mEq by mouth daily.    . pravastatin (PRAVACHOL) 40 MG tablet Take 1 tablet by mouth  daily 90 tablet 1  . sertraline (ZOLOFT) 100 MG tablet Take 100 mg by mouth daily.      Marland Kitchen warfarin (COUMADIN) 5 MG  tablet Take 0.5 tablets (2.5 mg total) by mouth daily at 6 PM. 30 tablet 0   No current facility-administered medications for this visit.    Allergies:    Allergies  Allergen Reactions  . Keflex [Cephalexin] Shortness Of Breath and Other (See Comments)    dizziness    Social History:  The patient  reports that he quit smoking about 17 years ago. His smoking use included Cigarettes. He does not have any smokeless tobacco history on file. He reports that he does not drink alcohol or use illicit drugs.   Family history:   Family History  Problem Relation Age of Onset  . Breast cancer Mother   . Depression Mother   . Heart attack Father   . Breast cancer Sister     ROS:  Please see the history of present illness.  All other systems reviewed and negative.   PHYSICAL EXAM: VS:  BP 82/50 mmHg  Pulse 80  Ht 5\' 10"  (1.778 m)  Wt 203 lb (92.08 kg)  BMI 29.13 kg/m2 Overweight, well developed, in no acute distress HEENT: Pupils are equal round react to light accommodation extraocular movements are intact.  Neck: no JVDNo cervical lymphadenopathy. Cardiac: Irregular rate and rhythm without murmurs rubs or gallops. Lungs:  clear to auscultation bilaterally, no wheezing, rhonchi or rales Abd: soft, nontender, positive bowel sounds all quadrants, no hepatosplenomegaly Ext: 2+ bilateral pitting lower extremity edema.  2+ radial and dorsalis pedis pulses. Skin: warm and dry Neuro:  Grossly normal    ASSESSMENT AND PLAN:  Problem List Items Addressed This Visit    HLD (hyperlipidemia)   Relevant Medications   furosemide (LASIX) 40 MG tablet   Essential hypertension (Chronic)   Relevant Medications   furosemide (LASIX) 40 MG tablet   Chronic systolic CHF (congestive heart failure) (HCC) (Chronic)   Relevant Medications   furosemide (LASIX) 40 MG tablet   Chronic anticoagulation-Couamdin (Chronic)   Chronic a-fib (HCC) - Primary (Chronic)   Relevant Medications   furosemide  (LASIX) 40 MG tablet   Cardiomyopathy, ischemic-EF 20-25% (Chronic)   Relevant Medications   furosemide (LASIX) 40 MG tablet   CAD S/P LAD PCI- 2000   Relevant Medications   furosemide (LASIX) 40 MG tablet   Biventricular implantable cardioverter-defibrillator Medtronic (Chronic)     Mr. Grinder is down 8 pounds since his last office visit partially one week ago on Lasix 20 mg twice daily. He is also an extra potassium. He does complain of the excessive urination. He also has considerable lower extremity edema but the but no orthopnea or JVD.  This edema is likely multifactorial from heart failure and severe venous insufficiency. Recommended he continue the 40 mg of Lasix  once daily in the morning with the potassium in them once he is down 5 more pounds decrease to 20 mg daily and 20 MG Q of potassium daily. These out walking recommended he wrap both legs with Ace bandages since he cannot put on compression socks. He just had a basic metabolic panel today and I will review those results when they finalize. His blood pressure is low at 82/50. Will stop his Cozaar which is 25 mg daily. He is also on 2 different metoprolol: Metoprolol XL 25 mg daily and Lopressor 25 mg 2 times daily. I've asked him to call back and let us know which one he is taking. Continue statin. Her rate is well controlled currently is on Coumadin and digoxin. Follow-up in one month with an extender

## 2016-05-12 NOTE — Telephone Encounter (Signed)
New message      Pt was seen this am.  He was supposed to call back and tell the nurse which metoprolol he takes.  He takes metoprolol ER 25mg ---1 tablet daily with food.  It did not have the word succinate or tartrate on the bottle

## 2016-05-12 NOTE — Patient Instructions (Addendum)
MEDICATION INSTRUCTIONS  Take lasix 40mg  daily & 73mEq potassium until your weight goes down 5lbs from your weight at home -- Once your weight decreases by 5lbs, decrease dose of lasix to 20mg  daily and potassium 64mEq  STOP losartan   Call when you get home to let us know if are taking METOPROLOL SUCCINATE or METOPROLOL TARTRATE  Your physician recommends that you schedule a follow-up appointment in: ONE MONTH with either Dr. Martinique or PA/NP

## 2016-05-13 NOTE — Telephone Encounter (Signed)
Patient returned call - message received and documented in another telephone note.

## 2016-05-13 NOTE — Telephone Encounter (Signed)
Message received and communicated to B. Hager, [place] - he will addend the note from 05/12/16 to reflect this update Med list updated accordingly.

## 2016-05-19 ENCOUNTER — Telehealth: Payer: Self-pay

## 2016-05-19 ENCOUNTER — Ambulatory Visit (INDEPENDENT_AMBULATORY_CARE_PROVIDER_SITE_OTHER): Payer: Medicare Other

## 2016-05-19 DIAGNOSIS — Z95810 Presence of automatic (implantable) cardiac defibrillator: Secondary | ICD-10-CM

## 2016-05-19 DIAGNOSIS — I5022 Chronic systolic (congestive) heart failure: Secondary | ICD-10-CM

## 2016-05-19 NOTE — Telephone Encounter (Signed)
Remote ICM transmission received.  Attempted patient call and left message for return call.   

## 2016-05-19 NOTE — Progress Notes (Signed)
EPIC Encounter for ICM Monitoring  [name] is a 70 y.o. male Date: 05/19/2016 Primary Care Physican: [name], MD Primary Cardiologist: [place] Electrophysiologist: Faustino Congress Weight: 201.8 lbs  Bi-V Pacing 64.3%      In the past month, have you:  1. Gained more than 2 pounds in a day or more than 5 pounds in a week? no  2. Had changes in your medications (with verification of current medications)? no  3. Had more shortness of breath than is usual for you? no  4. Limited your activity because of shortness of breath? no  5. Not been able to sleep because of shortness of breath? no  6. Had increased swelling in your feet, ankles, legs or stomach area? Continue to be swollen but thinks it has improved since last office appointment on   7. Had symptoms of dehydration (dizziness, dry mouth, increased thirst, decreased urine output) no  8. Had changes in sodium restriction? no         9.  Been compliant with medication? No, has not been taking correct dosages of Furosemide and Potassium since office visits on 5/18 and 5/25.   He had written patient instructions but did not review once he was home.   Since 05/12/2016, patient has been taking Furosemide 40 mg bid (total of 80 mg daily) and Potassium 20 mEq 1/2 tablet bid (total of 20 mEq).   He was instructed on 05/12/2016 to take Lasix 40 mg daily and Potassium 58mEq until weight goes down by 5 lbs from weight at home...once weight decreases by 5 lbs, decrease dose of Lasix to 20 mg daily and  potassium 18mEq daily but has not been taking it as instructed.           CM trend: 3 month view for 05/19/2016  ICM trend: 1 year view for 05/19/2016   Follow-up plan: ICM clinic phone appointment 06/02/2016.  Office appointment with Kerin Ransom, PA on 06/14/2016.     FLUID LEVELS:  Since last ICM transmission 05/08/2016, Optivol thoracic impedance continues to be decreased and no improvement after taking increase dose of  Furosemide.    SYMPTOMS:   Continues to have leg swelling and he reported some of that is peripheral edema.  Patient had 2+ edema at office visit with Tarri Fuller, PA on 05/12/2016 and patient reported some improvement since that time.  He has not wrapped legs in Ace Bandages as recommended by B.Samara Snide, [place].     Patient reported it is ok to leave detailed message on home phone if needed.   RECOMMENDATIONS:   Reviewed with Dr Caryl Comes in the office and advised for patient to take Furosemide 40 mg bid and Potassium 40 mEq bid.  He should follow up with PCP regarding possibility for taking iron due to low Hgb.   Attempted call to patient x 2 to provide Dr Aquilla Hacker recommendations.    Call to patient and left recommendations on confidential voice mail as requested by patient.  Advised to take Furosemide 40 mg bid and Potassium 40 mEq bid.  He should follow up with PCP to see if any iron should be prescribed.  Advised he could reach Dr Olin Pia nurse, Nira Conn until 5 pm today if he has any questions.  Provided main phone number.    [name], RN, CCM 05/19/2016 3:08 PM

## 2016-05-24 DIAGNOSIS — D649 Anemia, unspecified: Secondary | ICD-10-CM | POA: Insufficient documentation

## 2016-05-30 ENCOUNTER — Other Ambulatory Visit: Payer: Self-pay | Admitting: *Deleted

## 2016-05-30 MED ORDER — FUROSEMIDE 40 MG PO TABS: 40.0000 mg | ORAL_TABLET | Freq: Every day | ORAL | Status: DC

## 2016-05-30 NOTE — Telephone Encounter (Signed)
Rx request sent to pharmacy.  

## 2016-05-31 ENCOUNTER — Other Ambulatory Visit: Payer: Self-pay | Admitting: *Deleted

## 2016-05-31 MED ORDER — FUROSEMIDE 40 MG PO TABS: 40.0000 mg | ORAL_TABLET | Freq: Every day | ORAL | Status: DC

## 2016-06-02 ENCOUNTER — Other Ambulatory Visit: Payer: Self-pay

## 2016-06-02 ENCOUNTER — Ambulatory Visit (INDEPENDENT_AMBULATORY_CARE_PROVIDER_SITE_OTHER): Payer: Medicare Other

## 2016-06-02 DIAGNOSIS — I5022 Chronic systolic (congestive) heart failure: Secondary | ICD-10-CM

## 2016-06-02 DIAGNOSIS — Z95810 Presence of automatic (implantable) cardiac defibrillator: Secondary | ICD-10-CM

## 2016-06-02 MED ORDER — FUROSEMIDE 40 MG PO TABS: 40.0000 mg | ORAL_TABLET | Freq: Two times a day (BID) | ORAL | Status: DC

## 2016-06-02 NOTE — Telephone Encounter (Signed)
Spoke with patient and he stated he needed rx [facility] for [facility] day supply, calling to verify

## 2016-06-02 NOTE — Progress Notes (Signed)
EPIC Encounter for ICM Monitoring  [name] is a 70 y.o. male Date: 06/02/2016 Primary Care Physican: [name], MD Primary Cardiologist: [place] Electrophysiologist: Faustino Congress Weight: 198.8 lbs   Bi-V Pacing 62.5%      In the past month, have you:  1. Gained more than 2 pounds in a day or more than 5 pounds in a week? Yes, earlier this week was up 3 lbs and lost the weight overnight.   2. Had changes in your medications (with verification of current medications)? No  3. Had more shortness of breath than is usual for you? No   4. Limited your activity because of shortness of breath? No   5. Not been able to sleep because of shortness of breath? No   6. Had increased swelling in your feet, ankles, legs or stomach area? Yes, continues to have swelling in feet and legs but says they have improved in the last 2 weeks  7. Had symptoms of dehydration (dizziness, dry mouth, increased thirst, decreased urine output) No   8. Had changes in sodium restriction? No   9. Been compliant with medication? Yes   ICM trend: 3 month view for 06/02/2016   ICM trend: 1 year view for 06/02/2016   Follow-up plan: ICM clinic phone appointment 06/13/2016 day before office visit with Kerin Ransom, [place] on 06/14/2016.  FLUID LEVELS:  Since 05/19/2016 ICM transmission, Optivol thoracic impedance continues to be decreased since 03/24/2016 to today's transmission on 06/02/2016 suggesting fluid accumulation.  Thoracic impedance continues to be decreased even after Furosemide was increased on 05/19/2016.   Patient reported taking Furosemide 40 mg bid and Potassium 20 mEq two tablets (40 mEq) bid as ordered by Dr Caryl Comes on 05/19/2016.  Medication order entered today for the change of Furosemide and Potassium that Dr Caryl Comes ordered on 05/19/2016.   SYMPTOMS:  He continues to have leg/feet swelling and stated they have improved but still swollen.  Weight has varied by 3 lbs in the last week but is lower  today.  He denied any breathing difficulty.    EDUCATION: Limit sodium intake to < 2000 mg and fluid intake to 64 oz daily.     LABS: 05/12/2016 Creatinine 1.27, BUN 22. Potassium 3.5, Sodium 144 05/05/2016 Creatinine 1.22, BUN 17, Potassium 3.2, Sodium 143 03/14/2016 Creatinine 1.39, BUN 25, Potassium 3.4, Sodium 145  RECOMMENDATIONS:  Advised will send to PCP, Dr. Martinique and Dr. Caryl Comes for review regarding leg swelling and continued decrease in thoracic impedance.  Office appointment with PA on 06/14/2016.  Repeat transmission 06/13/2016.  [name], RN, CCM 06/02/2016 9:41 AM

## 2016-06-03 NOTE — Progress Notes (Signed)
Call back to patient and left message to continue with current doses of Furosemide and Potassium as discussed on 06/02/2016.  If worsening or additional fluid symptoms develop to use ER if needed.  Will repeat transmission on 06/07/2016.  As of 06/02/2016 phone call to patient, his weight was varying but no consistent weight gain of 3 pounds over night or 5 pounds in a week, leg swelling had improved and he had no breathing difficultly.

## 2016-06-07 ENCOUNTER — Ambulatory Visit (INDEPENDENT_AMBULATORY_CARE_PROVIDER_SITE_OTHER): Payer: Medicare Other

## 2016-06-07 DIAGNOSIS — Z95810 Presence of automatic (implantable) cardiac defibrillator: Secondary | ICD-10-CM

## 2016-06-07 DIAGNOSIS — I5022 Chronic systolic (congestive) heart failure: Secondary | ICD-10-CM

## 2016-06-07 MED ORDER — FUROSEMIDE 40 MG PO TABS: 40.0000 mg | ORAL_TABLET | Freq: Two times a day (BID) | ORAL | Status: DC

## 2016-06-07 NOTE — Progress Notes (Addendum)
EPIC Encounter for ICM Monitoring  [name] is a 70 y.o. male Date: 06/07/2016 Primary Care Physican: [name], MD Primary Cardiologist: [place] Electrophysiologist: Faustino Congress Weight: 199.2 lbs   Bi-V Pacing 55.9% - continues to decrease      In the past month, have you:  1. Gained more than 2 pounds in a day or more than 5 pounds in a week? No  2. Had changes in your medications (with verification of current medications)? No  3. Had more shortness of breath than is usual for you? No   4. Limited your activity because of shortness of breath? No   5. Not been able to sleep because of shortness of breath? No   6. Had increased swelling in your feet, ankles, legs or stomach area? Calves and feet swollen   7. Had symptoms of dehydration (dizziness, dry mouth, increased thirst, decreased urine output) No   8. Had changes in sodium restriction? No   9. Been compliant with medication? Yes, patient requested refill for Lasix and preferred pharmacy [facility].    ICM trend: 3 month view for 06/07/2016   ICM trend: 1 year view for 06/07/2016   Follow-up plan: ICM clinic phone appointment 06/13/2016.   Office visit with Kerin Ransom, PA on 06/14/2016.    FLUID LEVELS:  Since last ICM remote transmission 06/02/2016, Optivol thoracic impedance continues to be decreased but has shown improvement since Furosemide was increased to 40 mg bid on 05/19/2016.      SYMPTOMS:  Symptoms have improved in the last 2 weeks but still has some swelling in both calves and feet.  Weight has decreased to 199 lbs from 201 lbs on his scale at home.  Advised to call if symptoms worsen before his appointment on 06/14/2016.    EDUCATION:  Reminded him to limit sodium intake to < 2000 mg and fluid intake to 64 oz daily.     RECOMMENDATIONS:   Reviewed ICM transmission with Dr Caryl Comes in the office today.  Recommendation to limit salt and to call if symptoms worsen before he has  the office appointment with Kerin Ransom, [place] on 06/14/2016.    Repeat transmission on 06/13/2016.   Spoke with patient and advised of Dr Olin Pia recommendations.  He verbalized understanding.   [name], RN, CCM 06/07/2016 12:05 PM

## 2016-06-13 ENCOUNTER — Telehealth: Payer: Self-pay

## 2016-06-13 ENCOUNTER — Ambulatory Visit (INDEPENDENT_AMBULATORY_CARE_PROVIDER_SITE_OTHER): Payer: Medicare Other

## 2016-06-13 DIAGNOSIS — I5022 Chronic systolic (congestive) heart failure: Secondary | ICD-10-CM

## 2016-06-13 DIAGNOSIS — Z95810 Presence of automatic (implantable) cardiac defibrillator: Secondary | ICD-10-CM | POA: Diagnosis not present

## 2016-06-13 NOTE — Progress Notes (Signed)
EPIC Encounter for ICM Monitoring  [name] is a 70 y.o. male Date: 06/13/2016 Primary Care Physican: [name], MD Primary Cardiologist: [place] Electrophysiologist: Caryl Comes Dry Weight: unknown  Bi-V Pacing 54.4%      In the past month, have you:  1. Gained more than 2 pounds in a day or more than 5 pounds in a week? N/A  2. Had changes in your medications (with verification of current medications)? N/A  3. Had more shortness of breath than is usual for you? N/A  4. Limited your activity because of shortness of breath? N/A  5. Not been able to sleep because of shortness of breath? N/A  6. Had increased swelling in your feet, ankles, legs or stomach area? N/A  7. Had symptoms of dehydration (dizziness, dry mouth, increased thirst, decreased urine output) N/A  8. Had changes in sodium restriction? N/A  9. Been compliant with medication? N/A  ICM trend: 3 month view for 06/13/2016   ICM trend: 1 year view for 06/13/2016   Follow-up plan: ICM clinic phone appointment 06/27/2016.  Attempted call to patient and unable to reach.  Transmission reviewed.  FLUID LEVELS:  Since 06/07/2016 ICM transmission, Corvue thoracic impedance remains below baseline suggesting fluid accumulation.    Patient had not been taking Furosemide dosage correctly per office note on 05/12/2016.  Extra furosemide was given on as recommended by Dr Caryl Comes on 05/19/2016.  Slight improvement after taking extra Furosemide on 05/19/2016.     Sending to Kerin Ransom, [place], Dr [place], Dr Caryl Comes.  Patient has appointment with Kerin Ransom, PA on 06/14/2016    [name], RN, CCM 06/13/2016 3:12 PM

## 2016-06-13 NOTE — Telephone Encounter (Signed)
Remote ICM transmission received.  Attempted patient call and left message stating transmission received and will forward for his appointment tomorrow.

## 2016-06-14 ENCOUNTER — Ambulatory Visit (INDEPENDENT_AMBULATORY_CARE_PROVIDER_SITE_OTHER): Payer: Medicare Other | Admitting: Cardiology

## 2016-06-14 ENCOUNTER — Encounter: Payer: Self-pay | Admitting: Cardiology

## 2016-06-14 VITALS — BP 113/64 | HR 110 | Ht 70.0 in | Wt 200.2 lb

## 2016-06-14 DIAGNOSIS — I482 Chronic atrial fibrillation, unspecified: Secondary | ICD-10-CM

## 2016-06-14 DIAGNOSIS — Z9861 Coronary angioplasty status: Secondary | ICD-10-CM

## 2016-06-14 DIAGNOSIS — I2510 Atherosclerotic heart disease of native coronary artery without angina pectoris: Secondary | ICD-10-CM

## 2016-06-14 DIAGNOSIS — I5022 Chronic systolic (congestive) heart failure: Secondary | ICD-10-CM

## 2016-06-14 DIAGNOSIS — I10 Essential (primary) hypertension: Secondary | ICD-10-CM | POA: Diagnosis not present

## 2016-06-14 DIAGNOSIS — I255 Ischemic cardiomyopathy: Secondary | ICD-10-CM

## 2016-06-14 DIAGNOSIS — R609 Edema, unspecified: Secondary | ICD-10-CM

## 2016-06-14 DIAGNOSIS — Z95810 Presence of automatic (implantable) cardiac defibrillator: Secondary | ICD-10-CM

## 2016-06-14 DIAGNOSIS — R600 Localized edema: Secondary | ICD-10-CM | POA: Insufficient documentation

## 2016-06-14 DIAGNOSIS — Z7901 Long term (current) use of anticoagulants: Secondary | ICD-10-CM

## 2016-06-14 DIAGNOSIS — K265 Chronic or unspecified duodenal ulcer with perforation: Secondary | ICD-10-CM

## 2016-06-14 NOTE — Assessment & Plan Note (Signed)
His rate is a little high but he is asymptomatic

## 2016-06-14 NOTE — Assessment & Plan Note (Signed)
Non responder

## 2016-06-14 NOTE — Progress Notes (Signed)
06/14/2016 [name]   [birth year]  [id number]  Primary Physician [name], MD Primary Cardiologist: Dr [place]  HPI:  70 y/o male followed by Dr Martinique with remote CAD and cardiomyopathy. He has a BIv- D in place but is a non responder. He recently saw Tarri Fuller in the office and complained of increasing LE edema. His Lasix was increased to BID along with  His K+ supplement. He is in the office for follow up. His legs are better- I suspect his venous varicosities are the likely cause of his edema as opposed to CHF. He brought in recording of his daily wgts and these are pretty stable 198-202. He denies any unusual dyspnea or orthopnea.   Current Outpatient Prescriptions  Medication Sig Dispense Refill  . digoxin (DIGOX) 0.125 MG tablet Take one-half tablet by  mouth daily 7 tablet 0  . fenofibrate 160 MG tablet Take 1 tablet by mouth  daily 90 tablet 2  . ferrous sulfate 325 (65 FE) MG tablet Take 325 mg by mouth 2 (two) times daily.    . furosemide (LASIX) 40 MG tablet Take 1 tablet (40 mg total) by mouth 2 (two) times daily. 180 tablet 2  . metoprolol succinate (TOPROL-XL) 25 MG 24 hr tablet Take 25 mg by mouth daily.    . pantoprazole (PROTONIX) 40 MG tablet Take 2 tablets (80 mg total) by mouth 2 (two) times daily.    . potassium chloride SA (K-DUR,KLOR-CON) 20 MEQ tablet Take 20 mEq by mouth. Take 2 tablets (40 mEq) twice a day     . pravastatin (PRAVACHOL) 40 MG tablet Take 1 tablet by mouth  daily 90 tablet 1  . sertraline (ZOLOFT) 100 MG tablet Take 100 mg by mouth daily.      Marland Kitchen warfarin (COUMADIN) 5 MG tablet Take 0.5 tablets (2.5 mg total) by mouth daily at 6 PM. 30 tablet 0   No current facility-administered medications for this visit.    Allergies  Allergen Reactions  . Keflex [Cephalexin] Shortness Of Breath and Other (See Comments)    dizziness    Social History   Social History  . Marital Status: Married    Spouse Name: N/A  . Number of Children:  2  . Years of Education: N/A   Occupational History  . disabled    Social History Main Topics  . Smoking status: Former Smoker    Types: Cigarettes    Quit date: 12/23/1998  . Smokeless tobacco: Not on file  . Alcohol Use: No  . Drug Use: No  . Sexual Activity: Not on file   Other Topics Concern  . Not on file   Social History Narrative   Lives in [place].  Retired Teacher, English as a foreign language           Review of Systems: General: negative for chills, fever, night sweats or weight changes.  Cardiovascular: negative for chest pain, dyspnea on exertion, edema, orthopnea, palpitations, paroxysmal nocturnal dyspnea or shortness of breath Dermatological: negative for rash Respiratory: negative for cough or wheezing Urologic: negative for hematuria Abdominal: negative for nausea, vomiting, diarrhea, bright red blood per rectum, melena, or hematemesis Neurologic: negative for visual changes, syncope, or dizziness All other systems reviewed and are otherwise negative except as noted above.    Blood pressure 113/64, pulse 110, height 5\' 10"  (1.778 m), weight 200 lb 3.2 oz (90.81 kg).  General appearance: alert, cooperative, no distress, mildly obese and poor dentition Neck: no carotid bruit and no JVD  Lungs: clear to auscultation bilaterally Heart: irregularly irregular rhythm Extremities: 1+ bilateral LE edema with varicosities noted Skin: Skin color, texture, turgor normal. No rashes or lesions Neurologic: Grossly normal   ASSESSMENT AND PLAN:   Cardiomyopathy, ischemic-EF 20-25% By echo Jan 0000000  Chronic systolic CHF (congestive heart failure) (HCC) Compensated CHF on exam today  Chronic a-fib (HCC) His rate is a little high but he is asymptomatic  Essential hypertension Controlled  CAD S/P LAD PCI- 2000 Status post acute anterior myocardial infarction in 2000 treated with direct PCI of his proximal LAD. Cardiac catheterization in February of 2009 showed nonobstructive  disease. No reports of angina  Duodenal ulcer with perforation [facility]) March 2017- no further bleeding  Chronic anticoagulation-Couamdin Stable  Biventricular implantable cardioverter-defibrillator Medtronic Non responder  Edema extremities He asked if he could decrease Lasix as this has improved   PLAN  I had a long pleasant visit with [name]. He had a list of questions and concerns and we reviewed these in order. 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 I told him this would be OK for the travel day. I also suggested he decrease his Lasix and K+ to QD. He can increase both to twice a day for a few days if needed for increased wgt or LE edema. F/U Dr Martinique in 6 months.   Kerin Ransom PA-C 06/14/2016 2:44 PM

## 2016-06-14 NOTE — Assessment & Plan Note (Signed)
Compensated CHF on exam today

## 2016-06-14 NOTE — Assessment & Plan Note (Signed)
By echo Jan 2017

## 2016-06-14 NOTE — Assessment & Plan Note (Signed)
He asked if he could decrease Lasix as this has improved

## 2016-06-14 NOTE — Assessment & Plan Note (Signed)
Controlled.  

## 2016-06-14 NOTE — Patient Instructions (Signed)
Weigh daily. Call [phone] if weight climbs more than 3 pounds in a day or 5 pounds in a week. No salt to very little salt in your diet.  No more than 2000 mg in a day. Call if increased shortness of breath or increased swelling.  Kerin Ransom, [place], has recommended making the following medication changes: 1. DECREASE Furosemide to 1 tablet by mouth once daily - you may take an extra tablet if you experience increased swelling and/or weight gain 2. DECREASE Potassium to 2 tablets by mouth once daily - you may take 2 extra tablets if you experience increased swelling and/or weight gain  Lurena Joiner recommends that you schedule a follow-up appointment in 6 months with Dr Martinique. You will receive a reminder letter in the mail two months in advance. If you don't receive a letter, please call our office to schedule the follow-up appointment.  If you need a refill on your cardiac medications before your next appointment, please call your pharmacy.  How to Use Compression Stockings Compression stockings are elastic socks that squeeze the legs. They help to increase blood flow to the legs, decrease swelling in the legs, and reduce the chance of developing blood clots in the lower legs. Compression stockings are often used by people who:  Are recovering from surgery.  Have poor circulation in their legs.  Are prone to getting blood clots in their legs.  Have varicose veins.  Sit or stay in bed for long periods of time. HOW TO USE COMPRESSION STOCKINGS Before you put on your compression stockings:  Make sure that they are the correct size. If you do not know your size, ask your health care provider.  Make sure that they are clean, dry, and in good condition.  Check them for rips and tears. Do not put them on if they are ripped or torn. Put your stockings on first thing in the morning, before you get out of bed. Keep them on for as long as your health care provider advises. When you are wearing your  stockings:  Keep them as smooth as possible. Do not allow them to bunch up. It is especially important to prevent the stockings from bunching up around your toes or behind your knees.  Do not roll the stockings downward and leave them rolled down. This can decrease blood flow to your leg.  Change them right away if they become wet or dirty. When you take off your stockings, inspect your legs and feet. Anything that does not seem normal may require medical attention. Look for:  Open sores.  Red spots.  Swelling. INFORMATION AND TIPS  Do not stop wearing your compression stockings without talking to your health care provider first.  Wash your stockings everyday with mild detergent in cold or warm water. Do not use bleach. Air-dry your stockings or dry them in a clothes dryer on low heat.  Replace your stockings every 3-6 months.  If skin moisturizing is part of your treatment plan, apply lotion or cream at night so that your skin will be dry when you put on the stockings in the morning. It is harder to put the stockings on when you have lotion on your legs or feet. SEEK MEDICAL CARE IF: Remove your stockings and seek medical care if:  You have a feeling of pins and needles in your feet or legs.  You have any new changes in your skin.  You have skin lesions that are getting worse.  You have swelling or  pain that is getting worse. SEEK IMMEDIATE MEDICAL CARE IF:  You have numbness or tingling in your lower legs that does not get better immediately after you take the stockings off.  Your toes or feet become cold and blue.  You develop open sores or red spots on your legs that do not go away.  You see or feel a warm spot on your leg.  You have new swelling or soreness in your leg.  You are short of breath or you have chest pain for no reason.  You have a rapid or irregular heartbeat.  You feel light-headed or dizzy.   This information is not intended to replace advice  given to you by your health care provider. Make sure you discuss any questions you have with your health care provider.   Document Released: 10/02/2009 Document Revised: 04/21/2015 Document Reviewed: 11/12/2014 Elsevier Interactive Patient Education [employer].

## 2016-06-14 NOTE — Assessment & Plan Note (Signed)
Status post acute anterior myocardial infarction in 2000 treated with direct PCI of his proximal LAD. Cardiac catheterization in February of 2009 showed nonobstructive disease. No reports of angina

## 2016-06-14 NOTE — Assessment & Plan Note (Signed)
Stable

## 2016-06-14 NOTE — Assessment & Plan Note (Signed)
March 2017- no further bleeding

## 2016-06-17 ENCOUNTER — Other Ambulatory Visit: Payer: Self-pay | Admitting: Internal Medicine

## 2016-06-27 ENCOUNTER — Ambulatory Visit (INDEPENDENT_AMBULATORY_CARE_PROVIDER_SITE_OTHER): Payer: Medicare Other

## 2016-06-27 ENCOUNTER — Telehealth: Payer: Self-pay | Admitting: Cardiology

## 2016-06-27 DIAGNOSIS — Z95810 Presence of automatic (implantable) cardiac defibrillator: Secondary | ICD-10-CM

## 2016-06-27 DIAGNOSIS — I5022 Chronic systolic (congestive) heart failure: Secondary | ICD-10-CM

## 2016-06-27 NOTE — Telephone Encounter (Signed)
Spoke with pt and reminded pt of remote transmission that is due today. Pt verbalized understanding.   

## 2016-06-27 NOTE — Progress Notes (Signed)
EPIC Encounter for ICM Monitoring  [name] is a 70 y.o. male Date: 06/27/2016 Primary Care Physican: [name], MD Primary Cardiologist: Martinique Electrophysiologist: Faustino Congress Weight: 203.4 lb  Bi-V Pacing:  57.9%   Heart Failure questions reviewed, pt symptomatic with leg swelling which is not a new symptom.     Thoracic impedence continues below reference line suggesting fluid accumulation.  Some improvement in impedance on 06/14/2016.      Low sodium diet education provided.  Patient does not follow low salt diet.    Recommendations:  Patient continues to take Furosemide and Potassium bid majority of days instead of daily as recommended by Kerin Ransom, PA on 06/14/2016 and Thoracic impedance shows little improvement.    Advised would send copy to Dr Martinique, Dr Caryl Comes and Kerin Ransom, PA for review and recommendations.       Labs:  05/12/2016 Creatinine 1.27, BUN 22, Potassium 3.5, Sodium 144 05/05/2016 Creatinine 1.22, BUN 17, Potassium 3.2, Sodium 143 03/14/2016 Creatinine 1.39, BUN 25, Potassium 3.4, Sodium 145  ICM trend: 06/27/2016   Follow-up plan: ICM clinic phone appointment on 07/11/2016.  Copy of ICM check sent to primary cardiologist and device physician.   [name], RN 06/27/2016 1:42 PM

## 2016-06-28 NOTE — Addendum Note (Signed)
Addended by: Cristopher Estimable on: 06/28/2016 08:02 AM   Modules accepted: Orders

## 2016-06-29 NOTE — Progress Notes (Signed)
Reviewed transmission with Dr Caryl Comes and recommendation to increase Furosemide 40mg  2 tablets am and 2 tablets pm x 3 days and then return to prescribed dose of Furosemide 40 mg daily.  Increase Potassium 20 mEq 2 tablets bid x 3 days and then return to prescribed dosage of Potassium 20 mEq daily.  He verbalized understanding and wrote down directions.  Recheck fluid levels on 07/04/2016.  Advised to call if he should feel any dizziness, lightheadedness, or weakness from taking extra medication.

## 2016-07-01 ENCOUNTER — Telehealth: Payer: Self-pay

## 2016-07-01 DIAGNOSIS — I5022 Chronic systolic (congestive) heart failure: Secondary | ICD-10-CM

## 2016-07-01 MED ORDER — POTASSIUM CHLORIDE CRYS ER 20 MEQ PO TBCR: 20.0000 meq | EXTENDED_RELEASE_TABLET | Freq: Every day | ORAL | Status: DC

## 2016-07-01 NOTE — Telephone Encounter (Signed)
Received call from patient.  He reported he will be out of Potassium tablets tomorrow since he has increased the dosage for 2 days.  He stated [facility] will not refill yet because too early.  He requested to send a prescription to local pharmacy to to last until his next refill at [facility].  Advise will refill at local pharmacy and refill sent to [facility] as well.

## 2016-07-04 ENCOUNTER — Ambulatory Visit (INDEPENDENT_AMBULATORY_CARE_PROVIDER_SITE_OTHER): Payer: Medicare Other

## 2016-07-04 ENCOUNTER — Telehealth: Payer: Self-pay

## 2016-07-04 DIAGNOSIS — I5022 Chronic systolic (congestive) heart failure: Secondary | ICD-10-CM

## 2016-07-04 DIAGNOSIS — Z95810 Presence of automatic (implantable) cardiac defibrillator: Secondary | ICD-10-CM

## 2016-07-04 NOTE — Telephone Encounter (Signed)
Remote ICM transmission received.  Attempted patient call and left detailed message and phone number.

## 2016-07-04 NOTE — Progress Notes (Signed)
EPIC Encounter for ICM Monitoring  [name] is a 70 y.o. male Date: 07/04/2016 Primary Care Physican: [name], MD Primary Cardiologist: Martinique Electrophysiologist: Caryl Comes Dry Weight:  unknown Bi-V Pacing:  64.2%       Attempted patient call and left detailed message.   Thoracic impedence returned to baseline after taking increase in Furosemide x 3 days from 06/29/2016 to 07/02/2016.      ICM trend: 07/04/2016        Follow-up plan: ICM clinic phone appointment on 08/04/2016.  Copy of ICM check sent to primary cardiologist and device physician for updated ICM transmission and impedance returned to normal.   [name], RN 07/04/2016 10:14 AM

## 2016-07-13 ENCOUNTER — Telehealth: Payer: Self-pay | Admitting: Cardiology

## 2016-07-13 NOTE — Telephone Encounter (Signed)
Returned patient call-pt states [employer] needs a letter for an explanation of medical need for compression stockings in order for insurance to cover them.  Reports letter he received has Pre authorization # [id number] and Provider Line # [phone].  Member ID# [id number].  Patient states he has one pair but would like to get another for backup.  States to call him with any questions.    Will route to primary RN.

## 2016-07-13 NOTE — Telephone Encounter (Signed)
New Message Pt calling to receive authorization for compression socks for his insurance company. Please follow up with pt. wy

## 2016-07-14 ENCOUNTER — Other Ambulatory Visit: Payer: Self-pay | Admitting: Internal Medicine

## 2016-07-14 NOTE — Telephone Encounter (Signed)
REFILL 

## 2016-07-21 NOTE — Telephone Encounter (Signed)
Spoke to [facility] patient's plan does not cover compression stockings.Patient was called and notified.

## 2016-08-04 ENCOUNTER — Ambulatory Visit (INDEPENDENT_AMBULATORY_CARE_PROVIDER_SITE_OTHER): Payer: Medicare Other

## 2016-08-04 ENCOUNTER — Ambulatory Visit (INDEPENDENT_AMBULATORY_CARE_PROVIDER_SITE_OTHER): Payer: Medicare Other | Admitting: *Deleted

## 2016-08-04 ENCOUNTER — Telehealth: Payer: Self-pay

## 2016-08-04 DIAGNOSIS — Z95810 Presence of automatic (implantable) cardiac defibrillator: Secondary | ICD-10-CM

## 2016-08-04 DIAGNOSIS — I5022 Chronic systolic (congestive) heart failure: Secondary | ICD-10-CM | POA: Diagnosis not present

## 2016-08-04 DIAGNOSIS — I255 Ischemic cardiomyopathy: Secondary | ICD-10-CM

## 2016-08-04 NOTE — Progress Notes (Signed)
EPIC Encounter for ICM Monitoring  [name] is a 70 y.o. male Date: 08/04/2016 Primary Care Physican: [name], MD Primary Cardiologist: Martinique Electrophysiologist: Faustino Congress Weight: 202.2 lb  Bi-V Pacing:  62.6%       Heart Failure questions reviewed, pt had weight gain when was on vacation last week.  Does not follow low salt diet and did not take diuretic for a couple of days when on vacation.  Wears compression stockings  Thoracic impedance abnormal suggesting fluid accumulation.  Labs:  05/12/2016 Creatinine 1.27, BUN 22, Potassium 3.5, Sodium 144 05/05/2016 Creatinine 1.22, BUN 17, Potassium 3.2, Sodium 143 03/14/2016 Creatinine 1.39, BUN 25, Potassium 3.4, Sodium 145  Recommendations:  Take additional Furosemide 40 mg 1 tablet x 2 days and return to normal dosage after 2nd day.  Take additional Potassium 20 mEq 1 tablet x 2 days and then return to normal dosage after 2nd day.   Repeat transmission 08/16/2016.    Follow-up plan: ICM clinic phone appointment on 08/16/2016.    Copy of ICM check sent to primary cardiologist and device physician.   ICM trend: 08/04/2016       [name], RN 08/04/2016 10:18 AM

## 2016-08-04 NOTE — Progress Notes (Signed)
Remote ICD transmission.   

## 2016-08-04 NOTE — Telephone Encounter (Signed)
Remote ICM transmission received.  Attempted patient call and left message for return call.   

## 2016-08-05 ENCOUNTER — Encounter: Payer: Self-pay | Admitting: Cardiology

## 2016-08-05 ENCOUNTER — Telehealth: Payer: Self-pay

## 2016-08-05 DIAGNOSIS — I5022 Chronic systolic (congestive) heart failure: Secondary | ICD-10-CM

## 2016-08-05 NOTE — Telephone Encounter (Signed)
Received call from patient.  He reported having severe leg cramps this week and wondered if the potassium could be low.  He has been taking the extra Furosemide since yesterday as recommended at [facility] call.  Recommended he have lab work to check Potassium. Will order BMET.  He reported he cannot get the lab drawn until 08/11/2016.  Advised to call or go to ER if he experiences any low potassium symptoms such as fatigue, weakness, increase in muscle cramps or tinging/numbness.

## 2016-08-11 ENCOUNTER — Other Ambulatory Visit: Payer: Medicare Other | Admitting: *Deleted

## 2016-08-11 DIAGNOSIS — I5022 Chronic systolic (congestive) heart failure: Secondary | ICD-10-CM

## 2016-08-11 LAB — BASIC METABOLIC PANEL
BUN: 34 mg/dL — ABNORMAL HIGH (ref 7–25)
CALCIUM: 9.8 mg/dL (ref 8.6–10.3)
CO2: 27 mmol/L (ref 20–31)
CREATININE: 1.59 mg/dL — AB (ref 0.70–1.25)
Chloride: 102 mmol/L (ref 98–110)
Glucose, Bld: 92 mg/dL (ref 65–99)
Potassium: 3.9 mmol/L (ref 3.5–5.3)
SODIUM: 141 mmol/L (ref 135–146)

## 2016-08-16 ENCOUNTER — Other Ambulatory Visit: Payer: Self-pay

## 2016-08-16 ENCOUNTER — Ambulatory Visit (INDEPENDENT_AMBULATORY_CARE_PROVIDER_SITE_OTHER): Payer: Medicare Other

## 2016-08-16 ENCOUNTER — Telehealth: Payer: Self-pay

## 2016-08-16 DIAGNOSIS — I5022 Chronic systolic (congestive) heart failure: Secondary | ICD-10-CM

## 2016-08-16 DIAGNOSIS — Z95810 Presence of automatic (implantable) cardiac defibrillator: Secondary | ICD-10-CM

## 2016-08-16 NOTE — Progress Notes (Signed)
Received return call from patient.  He stated he is feeling fine at this time.  Reviewed transmission.  He received call from Dr Doug Sou nurse, Malachy Mood and was advised to take prescribed Furosemide dosage 40 mg daily and may take extra tablet for fluid symptoms.  Also take prescribed dosage of Potassium 20 mEq 1 tablet daily and take additional 1 tablet when taking extra dosage of Lasix.  Advised next ICM transmission scheduled for 09/20/2016.

## 2016-08-16 NOTE — Telephone Encounter (Signed)
Remote ICM transmission received.  Attempted patient call and left message for return call.   

## 2016-08-16 NOTE — Progress Notes (Signed)
EPIC Encounter for ICM Monitoring  Patient Name: KILEY OVERDORF is a 70 y.o. male Date: 08/16/2016 Primary Care Physican: [name], MD Primary Cardiologist: Martinique Electrophysiologist: Caryl Comes Dry Weight: unknown Bi-V Pacing:  63%       Attempted ICM call and unable to reach.  Transmission reviewed.   Since 08/04/2016 transmission, thoracic impedance returned to normal 08/06/2016 after taking increase in Furosemide x 3 days.  Thoracic impedance abnormal suggesting fluid accumulation 08/12/2016 and returned to normal 08/16/2016.  Labs:  08/11/2016 Creatinine 1.59, BUN 34, Potassium 3.9, Sodium 141 05/12/2016 Creatinine 1.27, BUN 22, Potassium 3.5, Sodium 144 05/05/2016 Creatinine 1.22, BUN 17, Potassium 3.2, Sodium 143 03/14/2016 Creatinine 1.39, BUN 25, Potassium 3.4, Sodium 145  Dr Martinique recommended for patient to take prescribed dose of Furosemide 40 mg.  Follow-up plan: ICM clinic phone appointment on 09/20/2016.  Copy of ICM check sent to primary cardiologist and device physician.   ICM trend: 08/16/2016       [name], RN 08/16/2016 8:16 AM

## 2016-08-16 NOTE — Progress Notes (Signed)
Notes Recorded by Peter M Martinique, MD on 08/14/2016 at 10:46 AM EDT Note recent increase in lasix dose. I would resume prior lasix dose. Potassium is now normal. (leg cramps).

## 2016-08-23 ENCOUNTER — Other Ambulatory Visit: Payer: Self-pay

## 2016-08-23 LAB — CUP PACEART REMOTE DEVICE CHECK
Battery Voltage: 2.97 V
Brady Statistic AP VS Percent: 0 %
Brady Statistic AS VP Percent: 65.21 %
Brady Statistic RA Percent Paced: 0 %
Brady Statistic RV Percent Paced: 62.55 %
HighPow Impedance: 49 Ohm
HighPow Impedance: 65 Ohm
Implantable Lead Implant Date: 20090226
Implantable Lead Location: 753858
Implantable Lead Location: 753859
Implantable Lead Location: 753860
Implantable Lead Model: 4194
Implantable Lead Model: 6947
Lead Channel Impedance Value: 323 Ohm
Lead Channel Impedance Value: 456 Ohm
Lead Channel Impedance Value: 570 Ohm
Lead Channel Impedance Value: 779 Ohm
Lead Channel Sensing Intrinsic Amplitude: 13.5 mV
Lead Channel Sensing Intrinsic Amplitude: 4.125 mV
Lead Channel Setting Pacing Amplitude: 2 V
Lead Channel Setting Pacing Amplitude: 2 V
Lead Channel Setting Pacing Pulse Width: 0.6 ms
MDC IDC LEAD IMPLANT DT: 20090226
MDC IDC LEAD IMPLANT DT: 20090226
MDC IDC MSMT BATTERY REMAINING LONGEVITY: 50 mo
MDC IDC MSMT LEADCHNL LV PACING THRESHOLD AMPLITUDE: 0.75 V
MDC IDC MSMT LEADCHNL LV PACING THRESHOLD PULSEWIDTH: 0.6 ms
MDC IDC MSMT LEADCHNL RA SENSING INTR AMPL: 4.125 mV
MDC IDC MSMT LEADCHNL RV IMPEDANCE VALUE: 323 Ohm
MDC IDC MSMT LEADCHNL RV IMPEDANCE VALUE: 342 Ohm
MDC IDC MSMT LEADCHNL RV SENSING INTR AMPL: 13.5 mV
MDC IDC SESS DTM: 20170817062825
MDC IDC SET LEADCHNL RV PACING PULSEWIDTH: 0.6 ms
MDC IDC SET LEADCHNL RV SENSING SENSITIVITY: 0.3 mV
MDC IDC STAT BRADY AP VP PERCENT: 0 %
MDC IDC STAT BRADY AS VS PERCENT: 34.79 %

## 2016-08-23 MED ORDER — DIGOXIN 125 MCG PO TABS: 62.5000 ug | ORAL_TABLET | Freq: Every day | ORAL | 2 refills | Status: DC

## 2016-08-29 ENCOUNTER — Other Ambulatory Visit: Payer: Self-pay | Admitting: Cardiology

## 2016-08-29 NOTE — Telephone Encounter (Signed)
REFILL 

## 2016-08-30 ENCOUNTER — Other Ambulatory Visit: Payer: Self-pay | Admitting: Gastroenterology

## 2016-08-30 ENCOUNTER — Encounter (HOSPITAL_COMMUNITY): Payer: Self-pay | Admitting: *Deleted

## 2016-08-30 NOTE — Progress Notes (Signed)
Pt denies SOB and chest pain but is under the care of Dr. Martinique, cardiology. Pt stated that he was instructed to stop Coumadin on 08/26/16. Pt stated that he takes Metoprolol " around noon because I have to eat with it."  Pt made aware to stop  taking vitamins, fish oil and herbal medications. Do not take any NSAIDs ie: Ibuprofen, Advil, Naproxen, BC and Goody Powder. Anesthesia asked to review pt cardiac history.  Pt verbalized understanding of al pre-op instructions.

## 2016-08-30 NOTE — Progress Notes (Signed)
Anesthesia Chart Review: [name] (ENDO).  Patient is a 70 year old male scheduled for EGD and colonoscopy on 08/31/16 by Dr. Michail Sermon.  History includes former smoker, CAD, anterior MI s/p PCI proximal LAD '00, ischemic cardiomyopathy, chronic systolic CHF, ICD CRT-D ([employer]; generator change 09/16/13), persistent afib, CKD, dyslipidemia, exertional dyspnea, anemia, anxiety, exploratory laparotomy with patch of duodenal ulcer 02/22/16,   PCP is Dr. Juanita Craver. Cardiologist is Dr. Martinique. Last visit with Kerin Ransom, PA-C on 06/14/16. His note states patient is a non-responder to his implantable cardioverter-defibrillator. CHF was compensated at that time. HR was a little elevated at 110 bpm, but he was asymptomatic. Six month follow-up recommended.  EP cardiologist is Dr. Caryl Comes.  Meds include digoxin, fenofibrate, 65 Fe, Lasix, Toprol XL, Protonix, KCl, pravastatin, Zoloft, warfarin (reported last dose 08/26/16).  06/14/16 EKG: V-paced rhythm with occasional PVC, Biventricular pacemaker detected.  01/06/16 Echo: Study Conclusions - Left ventricle: The cavity size was severely dilated. Wall   thickness was normal. Systolic function was severely reduced. The   estimated ejection fraction was in the range of 20% to 25%.   Dyskinesis of the apical myocardium. Akinesis and scarring of the   entireanteroseptal, anterior, and anterolateral myocardium.   Doppler parameters are consistent with restrictive physiology,   indicative of decreased left ventricular diastolic compliance   and/or increased left atrial pressure. No evidence of thrombus.   Acoustic contrast opacification revealed no evidence ofthrombus.   Acoustic contrast opacification revealed no evidence ofthrombus. - Ventricular septum: Septal motion showed paradox. These changes   are consistent with a left bundle branch block. - Mitral valve: Calcified annulus. There was mild to moderate   regurgitation. - Left atrium: The  atrium was moderately dilated. - Right atrium: The atrium was mildly dilated. - Atrial septum: No defect or patent foramen ovale was identified. - Pulmonary arteries: PA peak pressure: 32 mm Hg (S). (Comparison EF 25-30% on 06/24/13.)  04/01/14 ETT: Conclusions:  Baseline ECG show atrial fibrillation, nonspecific IVCD and triggered BiV pacing. Overall BiV pacing percentage is low. Heart rate response during exercise is exaggerated with complete loss of BiV pacing after only 2 minutes in the first stage of the Bruce protocol ECG stress test is uninterpretable for ischemia due to baseline major IVCD.  02/05/08 Cardiac cath: FINDINGS: 1. Left Main:  Normal-appearing. 2. LAD:  Patent proximal stent with mild 20-30% in-stent restenosis, with mid and distal luminal irregularities. 3. LCX:  Nondominant with mild luminal irregularities. 4. OM-1/OM-2:  Moderate-sized vessels with mild to moderate luminal irregularities. 5. RCA:  Dominant with mild to moderate luminal irregularities. 6. LV:  EF is 25-30%.  LVEDP is 25 mmHg. IMPRESSION: 1. Nonobstructive coronary arteries with patent proximal left anterior descending artery stent with mild 20-30% in-stent restenosis noted. 2. Moderate to severe left ventricular dysfunction with an ejection fraction of 25-30%. 3. Patent left circumflex, right coronary artery with no significant obstructive disease noted. PLAN:  At this time we will continue maximal medical treatment and the patient will be referred for a bi-V ICD implant.  He does have mild chronic renal insufficiency with baseline creatinine 1.8 to 2..0.   02/24/16 1V CXR: FINDINGS: The PICC line tip appears to have been withdrawn partially sensitive tip overlies the distal third of the SVC. The right internal jugular venous catheter tip projects over the midportion of the SVC. The other support tubes and lines are in reasonable position. IMPRESSION: The PICC line tip has been withdrawn approximately 2 cm  and appears to overlie the distal third of the SVC.  Labs per GI on arrival tomorrow. Further evaluation by his anesthesiologist at that time. If no acute CV/CHF symptoms and labs acceptable then I would anticipate that he could proceed as planned.  George Hugh [facility] Short Stay Center/Anesthesiology Phone [phone] 08/30/2016 3:29 PM

## 2016-08-31 ENCOUNTER — Encounter (HOSPITAL_COMMUNITY): Payer: Self-pay

## 2016-08-31 ENCOUNTER — Ambulatory Visit (HOSPITAL_COMMUNITY): Payer: Medicare Other | Admitting: Vascular Surgery

## 2016-08-31 ENCOUNTER — Encounter (HOSPITAL_COMMUNITY)
Admission: RE | Disposition: A | Discharge status: Home / Self Care | Payer: Self-pay | Source: Ambulatory Visit | Attending: Gastroenterology

## 2016-08-31 ENCOUNTER — Ambulatory Visit (HOSPITAL_COMMUNITY)
Admission: RE | Admit: 2016-08-31 | Discharge: 2016-08-31 | Disposition: A | Discharge status: Home / Self Care | Payer: Medicare Other | Source: Ambulatory Visit | Attending: Gastroenterology | Admitting: Gastroenterology

## 2016-08-31 DIAGNOSIS — G4730 Sleep apnea, unspecified: Secondary | ICD-10-CM | POA: Insufficient documentation

## 2016-08-31 DIAGNOSIS — K640 First degree hemorrhoids: Secondary | ICD-10-CM | POA: Diagnosis not present

## 2016-08-31 DIAGNOSIS — I110 Hypertensive heart disease with heart failure: Secondary | ICD-10-CM | POA: Insufficient documentation

## 2016-08-31 DIAGNOSIS — K317 Polyp of stomach and duodenum: Secondary | ICD-10-CM | POA: Diagnosis not present

## 2016-08-31 DIAGNOSIS — K2900 Acute gastritis without bleeding: Secondary | ICD-10-CM | POA: Insufficient documentation

## 2016-08-31 DIAGNOSIS — I2510 Atherosclerotic heart disease of native coronary artery without angina pectoris: Secondary | ICD-10-CM | POA: Diagnosis not present

## 2016-08-31 DIAGNOSIS — Z87891 Personal history of nicotine dependence: Secondary | ICD-10-CM | POA: Diagnosis not present

## 2016-08-31 DIAGNOSIS — D123 Benign neoplasm of transverse colon: Secondary | ICD-10-CM | POA: Diagnosis not present

## 2016-08-31 DIAGNOSIS — K5730 Diverticulosis of large intestine without perforation or abscess without bleeding: Secondary | ICD-10-CM | POA: Insufficient documentation

## 2016-08-31 DIAGNOSIS — Z8711 Personal history of peptic ulcer disease: Secondary | ICD-10-CM | POA: Diagnosis not present

## 2016-08-31 DIAGNOSIS — K633 Ulcer of intestine: Secondary | ICD-10-CM | POA: Diagnosis not present

## 2016-08-31 DIAGNOSIS — I509 Heart failure, unspecified: Secondary | ICD-10-CM | POA: Diagnosis not present

## 2016-08-31 DIAGNOSIS — I252 Old myocardial infarction: Secondary | ICD-10-CM | POA: Diagnosis not present

## 2016-08-31 DIAGNOSIS — D509 Iron deficiency anemia, unspecified: Secondary | ICD-10-CM | POA: Diagnosis not present

## 2016-08-31 HISTORY — PX: ESOPHAGOGASTRODUODENOSCOPY (EGD) WITH PROPOFOL: SHX5813

## 2016-08-31 HISTORY — DX: Anxiety disorder, unspecified: F41.9

## 2016-08-31 HISTORY — PX: COLONOSCOPY WITH PROPOFOL: SHX5780

## 2016-08-31 HISTORY — DX: Anemia, unspecified: D64.9

## 2016-08-31 SURGERY — ESOPHAGOGASTRODUODENOSCOPY (EGD) WITH PROPOFOL
Anesthesia: Monitor Anesthesia Care

## 2016-08-31 MED ORDER — FENTANYL CITRATE (PF) 100 MCG/2ML IJ SOLN
INTRAMUSCULAR | Status: DC | PRN
  Administered 2016-08-31: 50 ug via INTRAVENOUS

## 2016-08-31 MED ORDER — PROPOFOL 500 MG/50ML IV EMUL
INTRAVENOUS | Status: DC | PRN
  Administered 2016-08-31: 50 ug/kg/min via INTRAVENOUS

## 2016-08-31 MED ORDER — MIDAZOLAM HCL 5 MG/5ML IJ SOLN
INTRAMUSCULAR | Status: DC | PRN
  Administered 2016-08-31: 1 mg via INTRAVENOUS

## 2016-08-31 MED ORDER — BUTAMBEN-TETRACAINE-BENZOCAINE 2-2-14 % EX AERO
INHALATION_SPRAY | CUTANEOUS | Status: DC | PRN
  Administered 2016-08-31: 2 via TOPICAL

## 2016-08-31 MED ORDER — LACTATED RINGERS IV SOLN
INTRAVENOUS | Status: DC | PRN
  Administered 2016-08-31: 10:00:00 via INTRAVENOUS

## 2016-08-31 MED ORDER — SODIUM CHLORIDE 0.9 % IV SOLN: INTRAVENOUS | Status: DC

## 2016-08-31 NOTE — Interval H&P Note (Signed)
History and Physical Interval Note:  08/31/2016 9:44 AM  [name]  has presented today for surgery, with the diagnosis of anemia/ulcer/rectal bleeding  The various methods of treatment have been discussed with the patient and family. After consideration of risks, benefits and other options for treatment, the patient has consented to  Procedure(s): ESOPHAGOGASTRODUODENOSCOPY (EGD) WITH PROPOFOL (N/A) COLONOSCOPY WITH PROPOFOL (N/A) as a surgical intervention .  The patient's history has been reviewed, patient examined, no change in status, stable for surgery.  I have reviewed the patient's chart and labs.  Questions were answered to the patient's satisfaction.     [place] C.

## 2016-08-31 NOTE — H&P (Signed)
Date of Initial H&P: 08/25/16  History reviewed, patient examined, no change in status, stable for surgery.  

## 2016-08-31 NOTE — Discharge Instructions (Addendum)
Will call you when pathology results are complete.   YOU HAD AN ENDOSCOPIC PROCEDURE TODAY: Refer to the procedure report and other information in the discharge instructions given to you for any specific questions about what was found during the examination. If this information does not answer your questions, please call [facility] office at [phone] to clarify.   YOU SHOULD EXPECT: Some feelings of bloating in the abdomen. Passage of more gas than usual. Walking can help get rid of the air that was put into your GI tract during the procedure and reduce the bloating. If you had a lower endoscopy (such as a colonoscopy or flexible sigmoidoscopy) you may notice spotting of blood in your stool or on the toilet paper. Some abdominal soreness may be present for a day or two, also.  DIET: Your first meal following the procedure should be a light meal and then it is ok to progress to your normal diet. A half-sandwich or bowl of soup is an example of a good first meal. Heavy or fried foods are harder to digest and may make you feel nauseous or bloated. Drink plenty of fluids but you should avoid alcoholic beverages for 24 hours. If you had a esophageal dilation, please see attached instructions for diet.   ACTIVITY: Your care partner should take you home directly after the procedure. You should plan to take it easy, moving slowly for the rest of the day. You can resume normal activity the day after the procedure however YOU SHOULD NOT DRIVE, use power tools, machinery or perform tasks that involve climbing or major physical exertion for 24 hours (because of the sedation medicines used during the test).   SYMPTOMS TO REPORT IMMEDIATELY: A gastroenterologist can be reached at any hour. Please call [phone]  for any of the following symptoms:  Following lower endoscopy (colonoscopy, flexible sigmoidoscopy) Excessive amounts of blood in the stool  Significant tenderness, worsening of abdominal pains    Swelling of the abdomen that is new, acute  Fever of 100 or higher  Following upper endoscopy (EGD, EUS, ERCP, esophageal dilation) Vomiting of blood or coffee ground material  New, significant abdominal pain  New, significant chest pain or pain under the shoulder blades  Painful or persistently difficult swallowing  New shortness of breath  Black, tarry-looking or red, bloody stools  FOLLOW UP:  If any biopsies were taken you will be contacted by phone or by letter within the next 1-3 weeks. Call [phone]  if you have not heard about the biopsies in 3 weeks.  Please also call with any specific questions about appointments or follow up tests.    Colonoscopy, Care After These instructions give you information on caring for yourself after your procedure. Your doctor may also give you more specific instructions. Call your doctor if you have any problems or questions after your procedure. HOME CARE  Do not drive for 24 hours.  Do not sign important papers or use machinery for 24 hours.  You may shower.  You may go back to your usual activities, but go slower for the first 24 hours.  Take rest breaks often during the first 24 hours.  Walk around or use warm packs on your belly (abdomen) if you have belly cramping or gas.  Drink enough fluids to keep your pee (urine) clear or pale yellow.  Resume your normal diet. Avoid heavy or fried foods.  Avoid drinking alcohol for 24 hours or as told by your doctor.  Only take medicines  as told by your doctor. If a tissue sample (biopsy) was taken during the procedure:   Do not take aspirin or blood thinners for 7 days, or as told by your doctor.  Do not drink alcohol for 7 days, or as told by your doctor.  Eat soft foods for the first 24 hours. GET HELP IF: You still have a small amount of blood in your poop (stool) 2-3 days after the procedure. GET HELP RIGHT AWAY IF:  You have more than a small amount of blood in your  poop.  You see clumps of tissue (blood clots) in your poop.  Your belly is puffy (swollen).  You feel sick to your stomach (nauseous) or throw up (vomit).  You have a fever.  You have belly pain that gets worse and medicine does not help. MAKE SURE YOU:  Understand these instructions.  Will watch your condition.  Will get help right away if you are not doing well or get worse.   This information is not intended to replace advice given to you by your health care provider. Make sure you discuss any questions you have with your health care provider.   Document Released: 01/07/2011 Document Revised: 12/10/2013 Document Reviewed: 08/12/2013 Elsevier Interactive Patient Education 2016 [place] Monitored anesthesia care is an anesthesia service for a medical procedure. Anesthesia is the loss of the ability to feel pain. It is produced by medicines called anesthetics. It may affect a small area of your body (local anesthesia), a large area of your body (regional anesthesia), or your entire body (general anesthesia). The need for monitored anesthesia care depends your procedure, your condition, and the potential need for regional or general anesthesia. It is often provided during procedures where:   General anesthesia may be needed if there are complications. This is because you need special care when you are under general anesthesia.   You will be under local or regional anesthesia. This is so that you are able to have higher levels of anesthesia if needed.   You will receive calming medicines (sedatives). This is especially the case if sedatives are given to put you in a semi-conscious state of relaxation (deep sedation). This is because the amount of sedative needed to produce this state can be hard to predict. Too much of a sedative can produce general anesthesia. Monitored anesthesia care is performed by one or more health care providers who have  special training in all types of anesthesia. You will need to meet with these health care providers before your procedure. During this meeting, they will ask you about your medical history. They will also give you instructions to follow. (For example, you will need to stop eating and drinking before your procedure. You may also need to stop or change medicines you are taking.) During your procedure, your health care providers will stay with you. They will:   Watch your condition. This includes watching your blood pressure, breathing, and level of pain.   Diagnose and treat problems that occur.   Give medicines if they are needed. These may include calming medicines (sedatives) and anesthetics.   Make sure you are comfortable.  Having monitored anesthesia care does not necessarily mean that you will be under anesthesia. It does mean that your health care providers will be able to manage anesthesia if you need it or if it occurs. It also means that you will be able to have a different type of anesthesia than you  are having if you need it. When your procedure is complete, your health care providers will continue to watch your condition. They will make sure any medicines wear off before you are allowed to go home.    This information is not intended to replace advice given to you by your health care provider. Make sure you discuss any questions you have with your health care provider.   Document Released: 08/31/2005 Document Revised: 12/26/2014 Document Reviewed: 01/16/2013 Elsevier Interactive Patient Education [employer].

## 2016-08-31 NOTE — Transfer of Care (Signed)
Immediate Anesthesia Transfer of Care Note  Patient: [name]  Procedure(s) Performed: Procedure(s): ESOPHAGOGASTRODUODENOSCOPY (EGD) WITH PROPOFOL (N/A) COLONOSCOPY WITH PROPOFOL (N/A)  Patient Location: Endoscopy Unit  Anesthesia Type:MAC  Level of Consciousness: awake, alert  and oriented  Airway & Oxygen Therapy: Patient Spontanous Breathing  Post-op Assessment: Report given to RN and Post -op Vital signs reviewed and stable  Post vital signs: Reviewed and stable  Last Vitals:  Vitals:   08/31/16 0916  BP: 127/70  Pulse: 76  Resp: 16    Last Pain:  Vitals:   08/31/16 0916  TempSrc: Oral         Complications: No apparent anesthesia complications

## 2016-08-31 NOTE — Op Note (Signed)
[facility] [name] Procedure Date : 08/31/2016 MRN: [id number] Attending MD: Lear Ng , MD Date of Birth: 03-24-1946 CSN: [id number] Age: 70 Admit Type: Outpatient Procedure:                Colonoscopy Indications:              Hematochezia, Iron deficiency anemia Providers:                Lear Ng, MD, Vista Lawman, RN, Elspeth Cho Tech., Technician, Clearnce Sorrel, CRNA Referring MD:              Medicines:                Propofol per Anesthesia, Monitored Anesthesia Care Complications:            No immediate complications. Estimated Blood Loss:     Estimated blood loss: none. Procedure:                Pre-Anesthesia Assessment:                           - Prior to the procedure, a History and Physical                            was performed, and patient medications and                            allergies were reviewed. The patient's tolerance of                            previous anesthesia was also reviewed. The risks                            and benefits of the procedure and the sedation                            options and risks were discussed with the patient.                            All questions were answered, and informed consent                            was obtained. Prior Anticoagulants: The patient has                            taken Coumadin (warfarin), last dose was 5 days                            prior to procedure. ASA Grade Assessment: IV - A                            patient with severe systemic disease that is a  constant threat to life. After reviewing the risks                            and benefits, the patient was deemed in                            satisfactory condition to undergo the procedure.                           - Prior to the procedure, a History and Physical                            was performed, and patient medications  and                            allergies were reviewed. The patient's tolerance of                            previous anesthesia was also reviewed. The risks                            and benefits of the procedure and the sedation                            options and risks were discussed with the patient.                            All questions were answered, and informed consent                            was obtained. Prior Anticoagulants: The patient has                            taken Coumadin (warfarin), last dose was 5 days                            prior to procedure. ASA Grade Assessment: IV - A                            patient with severe systemic disease that is a                            constant threat to life. After reviewing the risks                            and benefits, the patient was deemed in                            satisfactory condition to undergo the procedure.                           After obtaining informed consent, the colonoscope  was passed under direct vision. Throughout the                            procedure, the patient's blood pressure, pulse, and                            oxygen saturations were monitored continuously. The                            [id number]) scope was introduced through                            the anus and advanced to the the cecum, identified                            by appendiceal orifice and ileocecal valve. The                            colonoscopy was performed without difficulty. The                            patient tolerated the procedure well. The quality                            of the bowel preparation was adequate and good. The                            terminal ileum, the appendiceal orifice and the                            rectum were photographed. Scope In: 10:26:58 AM Scope Out: 10:42:14 AM Scope Withdrawal Time: 0 hours 9 minutes 54 seconds  Total  Procedure Duration: 0 hours 15 minutes 16 seconds  Findings:      The perianal exam findings include non-thrombosed external hemorrhoids.      A 5 mm polyp was found in the transverse colon. The polyp was sessile.       The polyp was removed with a hot snare. Resection and retrieval were       complete. Estimated blood loss: none.      Scattered small and large-mouthed diverticula were found in the sigmoid       colon.      Internal hemorrhoids were found during retroflexion. The hemorrhoids       were medium-sized and Grade I (internal hemorrhoids that do not       prolapse).      The terminal ileum contained a few one mm ulcers. No bleeding was       present. No stigmata of recent bleeding were seen.      The remainder of the exam in the terminal ileum was normal. Impression:               - Non-thrombosed external hemorrhoids found on                            perianal exam.                           -  One 5 mm polyp in the transverse colon, removed                            with a hot snare. Resected and retrieved.                           - Diverticulosis in the sigmoid colon.                           - Internal hemorrhoids.                           - A few ulcers in the terminal ileum. Moderate Sedation:      N/A - MAC procedure Recommendation:           - Patient has a contact number available for                            emergencies. The signs and symptoms of potential                            delayed complications were discussed with the                            patient. Return to normal activities tomorrow.                            Written discharge instructions were provided to the                            patient.                           - Resume Coumadin (warfarin) at prior dose today.                            Refer to managing physician for further adjustment                            of therapy.                           - Await pathology results.                            - Repeat colonoscopy for surveillance based on                            pathology results.                           - High fiber diet. Procedure Code(s):        --- Professional ---                           [phone], Colonoscopy, flexible; with removal of  tumor(s), polyp(s), or other lesion(s) by snare                            technique Diagnosis Code(s):        --- Professional ---                           D50.9, Iron deficiency anemia, unspecified                           K92.1, Melena (includes Hematochezia)                           K63.3, Ulcer of intestine                           D12.3, Benign neoplasm of transverse colon (hepatic                            flexure or splenic flexure)                           K64.0, First degree hemorrhoids                           K64.4, Residual hemorrhoidal skin tags                           K57.30, Diverticulosis of large intestine without                            perforation or abscess without bleeding CPT copyright 2016 American Medical Association. All rights reserved. The codes documented in this report are preliminary and upon coder review may  be revised to meet current compliance requirements. Lear Ng, MD 08/31/2016 11:03:26 AM This report has been signed electronically. Number of Addenda: 0

## 2016-08-31 NOTE — Anesthesia Postprocedure Evaluation (Signed)
Anesthesia Post Note  Patient: [name]  Procedure(s) Performed: Procedure(s) (LRB): ESOPHAGOGASTRODUODENOSCOPY (EGD) WITH PROPOFOL (N/A) COLONOSCOPY WITH PROPOFOL (N/A)  Patient location during evaluation: PACU Anesthesia Type: MAC Level of consciousness: awake and alert Pain management: pain level controlled Vital Signs Assessment: post-procedure vital signs reviewed and stable Respiratory status: spontaneous breathing, nonlabored ventilation, respiratory function stable and patient connected to nasal cannula oxygen Cardiovascular status: stable and blood pressure returned to baseline Anesthetic complications: no    Last Vitals:  Vitals:   08/31/16 1100 08/31/16 1110  BP: 126/64 121/69  Pulse: 80 86  Resp: 14 12    Last Pain:  Vitals:   08/31/16 0916  TempSrc: Oral                 Reginal Lutes

## 2016-08-31 NOTE — Op Note (Signed)
[facility] [name] Procedure Date : 08/31/2016 MRN: [id number] Attending MD: Lear Ng , MD Date of Birth: [birth year] CSN: [id number] Age: 70 Admit Type: Outpatient Procedure:                Upper GI endoscopy Indications:              Iron deficiency anemia, Hematochezia, Personal                            history of peptic ulcer disease Providers:                Lear Ng, MD, Vista Lawman, RN, Elspeth Cho Tech., Technician, Clearnce Sorrel, CRNA Referring MD:              Medicines:                Propofol per Anesthesia, Monitored Anesthesia Care Complications:            No immediate complications. Estimated Blood Loss:     Estimated blood loss was minimal. Procedure:                Pre-Anesthesia Assessment:                           - Prior to the procedure, a History and Physical                            was performed, and patient medications and                            allergies were reviewed. The patient's tolerance of                            previous anesthesia was also reviewed. The risks                            and benefits of the procedure and the sedation                            options and risks were discussed with the patient.                            All questions were answered, and informed consent                            was obtained. Prior Anticoagulants: The patient has                            taken Coumadin (warfarin), last dose was 5 days                            prior to procedure. ASA Grade Assessment: IV - A  patient with severe systemic disease that is a                            constant threat to life. After reviewing the risks                            and benefits, the patient was deemed in                            satisfactory condition to undergo the procedure.                           After obtaining informed  consent, the endoscope was                            passed under direct vision. Throughout the                            procedure, the patient's blood pressure, pulse, and                            oxygen saturations were monitored continuously. The                            [id number]) scope was introduced through the                            mouth, and advanced to the second part of duodenum.                            The upper GI endoscopy was accomplished without                            difficulty. The patient tolerated the procedure                            well. Scope In: Scope Out: Findings:      The examined esophagus was normal.      The Z-line was regular and was found 42 cm from the incisors.      Segmental moderate inflammation characterized by congestion (edema),       erythema and linear erosions was found in the gastric antrum. Biopsies       were taken with a cold forceps for histology. Estimated blood loss was       minimal.      Segmental moderate mucosal changes characterized by congestion, erythema       and nodularity were found in the prepyloric region of the stomach.       Biopsies were taken with a cold forceps for histology. Estimated blood       loss was minimal.      The cardia and gastric fundus were normal on retroflexion.      A single medium-sized sessile polyp with no bleeding was found in the       second portion of the duodenum. Biopsies were taken with a cold forceps  for histology. Estimated blood loss was minimal.      There was evidence of a patent closure of a perforated duodenal ulcer in       the duodenal bulb. This was characterized by congestion, erythema and       visible sutures. Impression:               - Normal esophagus.                           - Z-line regular, 42 cm from the incisors.                           - Acute gastritis. Biopsied.                           - Congested, erythematous and nodular  mucosa in the                            prepyloric region of the stomach. Biopsied.                           - A single duodenal polyp. Biopsied.                           - Patent closure of a perforated duodenal ulcer,                            characterized by congestion, erythema and visible                            sutures was found. Moderate Sedation:      N/A - MAC procedure Recommendation:           - Await pathology results.                           - Resume Coumadin (warfarin) at prior dose today.                            Refer to managing physician for further adjustment                            of therapy.                           - Continue present medications.                           - Resume previous diet. Procedure Code(s):        --- Professional ---                           [phone], Esophagogastroduodenoscopy, flexible,                            transoral; with biopsy, single or multiple Diagnosis Code(s):        --- Professional ---  D50.9, Iron deficiency anemia, unspecified                           K92.1, Melena (includes Hematochezia)                           K29.00, Acute gastritis without bleeding                           K31.7, Polyp of stomach and duodenum                           K31.89, Other diseases of stomach and duodenum                           Z87.11, Personal history of peptic ulcer disease                           Z98.890, Other specified postprocedural states CPT copyright 2016 American Medical Association. All rights reserved. The codes documented in this report are preliminary and upon coder review may  be revised to meet current compliance requirements. Lear Ng, MD 08/31/2016 10:56:10 AM This report has been signed electronically. Number of Addenda: 0

## 2016-08-31 NOTE — Anesthesia Preprocedure Evaluation (Signed)
Anesthesia Evaluation  Patient identified by MRN, date of birth, ID band Patient awake    Reviewed: Allergy & Precautions, NPO status , Patient's Chart, lab work & pertinent test results, reviewed documented beta blocker date and time   Airway Mallampati: II  TM Distance: >3 FB Neck ROM: Full    Dental  (+) Teeth Intact   Pulmonary sleep apnea , former smoker,    breath sounds clear to auscultation       Cardiovascular hypertension, Pt. on medications and Pt. on home beta blockers + CAD, + Past MI and +CHF  + dysrhythmias + pacemaker + Cardiac Defibrillator  Rhythm:Regular Rate:Tachycardia  Intermittently paced.    Neuro/Psych PSYCHIATRIC DISORDERS Depression negative neurological ROS     GI/Hepatic negative GI ROS, Neg liver ROS,   Endo/Other  negative endocrine ROS  Renal/GU Renal InsufficiencyRenal disease  negative genitourinary   Musculoskeletal negative musculoskeletal ROS (+)   Abdominal   Peds negative pediatric ROS (+)  Hematology   Anesthesia Other Findings   Reproductive/Obstetrics negative OB ROS                             12/2015 Echo - LV is severely dilated. Wall thickness was normal. Systolic function was severely reduced. The estimated ejection fraction was in the range of 20% to 25%. Dyskinesis of the apical myocardium. Akinesis and scarring of the entireanteroseptal, anterior, and anterolateral myocardium. Doppler parameters are consistent with restrictive physiology, indicative of decreased left ventricular diastolic compliance and/or increased left atrial pressure. No evidence of thrombus. Acoustic contrast opacification revealed no evidence ofthrombus. Acoustic contrast opacification revealed no evidence ofthrombus. - Ventricular septum: Septal motion showed paradox. These changes are consistent with a left bundle branch block. - Mitral valve: Calcified annulus.  There was mild to moderate regurgitation. - Left atrium: The atrium was moderately dilated. - Right atrium: The atrium was mildly dilated. - Atrial septum: No defect or patent foramen ovale was identified. - Pulmonary arteries: PA peak pressure: 32 mm Hg (S).  Anesthesia Physical  Anesthesia Plan  ASA: IV  Anesthesia Plan: MAC   Post-op Pain Management:    Induction: Intravenous  Airway Management Planned: Nasal Cannula  Additional Equipment:   Intra-op Plan:   Post-operative Plan: Possible Post-op intubation/ventilation  Informed Consent: I have reviewed the patients History and Physical, chart, labs and discussed the procedure including the risks, benefits and alternatives for the proposed anesthesia with the patient or authorized representative who has indicated his/her understanding and acceptance.   Dental advisory given  Plan Discussed with: CRNA  Anesthesia Plan Comments:         Anesthesia Quick Evaluation

## 2016-09-01 ENCOUNTER — Encounter (HOSPITAL_COMMUNITY): Payer: Self-pay | Admitting: Gastroenterology

## 2016-09-20 ENCOUNTER — Ambulatory Visit (INDEPENDENT_AMBULATORY_CARE_PROVIDER_SITE_OTHER): Payer: Medicare Other

## 2016-09-20 DIAGNOSIS — I5022 Chronic systolic (congestive) heart failure: Secondary | ICD-10-CM | POA: Diagnosis not present

## 2016-09-20 DIAGNOSIS — Z95810 Presence of automatic (implantable) cardiac defibrillator: Secondary | ICD-10-CM | POA: Diagnosis not present

## 2016-09-20 NOTE — Progress Notes (Signed)
EPIC Encounter for ICM Monitoring  [name] is a 70 y.o. male Date: 09/20/2016 Primary Care Physican: [name], MD Primary [place] Electrophysiologist: Faustino Congress Weight:    207 lbs Bi-V Pacing:  65.9%             Heart Failure questions reviewed, pt symptomatic with weight gain. Lowest weight in the last 3 weeks has been 202 lbs.  Difficult to get answer from patient regarding if he has had an increase shortness of breath.    Thoracic impedance abnormal suggesting fluid accumulation.  Patient admits to not following low salt diet.  Labs:  08/11/2016 Creatinine 1.59, BUN 34, Potassium 3.9, Sodium 141 05/12/2016 Creatinine 1.27, BUN 22, Potassium 3.5, Sodium 144 05/05/2016 Creatinine 1.22, BUN 17, Potassium 3.2, Sodium 143 03/14/2016 Creatinine 1.39, BUN 25, Potassium 3.4, Sodium 145  Patient has not been compliant with taking correct dosage of Furosemide 40 mg daily and Potassium Potassium 20 mEq daily that was recommended by Dr Martinique on 08/16/2016.    He increased the dosage of Furosemide to 40 mg am and 20 mg pm and Potassium 20 mEq 20 and 1/2 tablet (10 mEq) pm for the last 3 weeks.   Even with taking extra 20 mg of Furosemide daily he still has weight gain.     Follow-up plan: ICM clinic phone appointment on 10/03/2016 to recheck fluid levels.  RECOMMENDATIONS: Copy of ICM note sent to Dr Martinique and Dr Caryl Comes for review and recommendations regarding dosage of Furosemide and Potassium.   ICM trend: 09/20/2016       [name], RN 09/20/2016 12:12 PM

## 2016-09-22 MED ORDER — FUROSEMIDE 40 MG PO TABS: 40.0000 mg | ORAL_TABLET | Freq: Two times a day (BID) | ORAL | 3 refills | Status: DC

## 2016-09-22 MED ORDER — POTASSIUM CHLORIDE CRYS ER 20 MEQ PO TBCR: 20.0000 meq | EXTENDED_RELEASE_TABLET | Freq: Two times a day (BID) | ORAL | 3 refills | Status: DC

## 2016-09-22 NOTE — Progress Notes (Signed)
Received: Today  Message Contents  Peter M Martinique, MD  [name], RN        I would increase lasix to 40 mg bid.   Peter Martinique MD, [facility]    Received: Today  Message Contents  Peter M Martinique, MD  [name], RN        Twice a day on the potassium   Peter Martinique MD, [facility]

## 2016-09-22 NOTE — Progress Notes (Signed)
Call to patient.  Advised of Dr Doug Sou recommendations to increase Furosemide 40 mg 1 tablet to bid and Potassium 20 mEq 1 tablet to bid.  He requested to send the new script to [facility] but has enough medication for now.  He verbalized understanding and repeated correct dosages.

## 2016-10-03 NOTE — Progress Notes (Signed)
ICM remote transmission rescheduled from 10/03/2016 to 10/12/2016.

## 2016-10-12 ENCOUNTER — Ambulatory Visit (INDEPENDENT_AMBULATORY_CARE_PROVIDER_SITE_OTHER): Payer: Medicare Other

## 2016-10-12 ENCOUNTER — Telehealth: Payer: Self-pay

## 2016-10-12 DIAGNOSIS — Z95810 Presence of automatic (implantable) cardiac defibrillator: Secondary | ICD-10-CM

## 2016-10-12 DIAGNOSIS — I5022 Chronic systolic (congestive) heart failure: Secondary | ICD-10-CM

## 2016-10-12 NOTE — Telephone Encounter (Signed)
Remote ICM transmission received.  Attempted patient call and left detailed message regarding transmission and next ICM scheduled for 11/03/2016.  Advised to return call for any fluid symptoms or questions.

## 2016-10-12 NOTE — Progress Notes (Signed)
EPIC Encounter for ICM Monitoring  [name] is a 70 y.o. male Date: 10/12/2016 Primary Care Physican: [name], MD Primary [place] Electrophysiologist: Faustino Congress Weight:unknown Bi-V Pacing: 65.9%      Attempted ICM call and unable to reach. Left detailed message regarding transmission.  Transmission reviewed.   Thoracic impedance returned to normal after increase in Furosemide and Potassium by Dr Martinique on 09/19/2016.  Labs:  08/11/2016 Creatinine 1.59, BUN 34, Potassium 3.9, Sodium 141 05/12/2016 Creatinine 1.27, BUN 22, Potassium 3.5, Sodium 144 05/05/2016 Creatinine 1.22, BUN 17, Potassium 3.2, Sodium 143 03/14/2016 Creatinine 1.39, BUN 25, Potassium 3.4, Sodium 145  Follow-up plan: ICM clinic phone appointment on 11/03/2016.  Copy of ICM check sent to primary cardiologist and device physician for updated transmission and fluid levels returned to normal.   ICM trend: 10/12/2016       [name], RN 10/12/2016 12:34 PM

## 2016-10-27 ENCOUNTER — Telehealth: Payer: Self-pay | Admitting: Cardiology

## 2016-10-27 NOTE — Telephone Encounter (Signed)
Received records from [facility] for appointment on 11/24/16 with Dr Martinique.,  Records given to [facility] (medical records) for Dr Doug Sou schedule on 11/24/16. lp

## 2016-10-28 ENCOUNTER — Other Ambulatory Visit: Payer: Self-pay | Admitting: General Surgery

## 2016-10-28 DIAGNOSIS — K432 Incisional hernia without obstruction or gangrene: Secondary | ICD-10-CM

## 2016-11-03 ENCOUNTER — Ambulatory Visit (INDEPENDENT_AMBULATORY_CARE_PROVIDER_SITE_OTHER): Payer: Medicare Other | Admitting: *Deleted

## 2016-11-03 DIAGNOSIS — Z95810 Presence of automatic (implantable) cardiac defibrillator: Secondary | ICD-10-CM

## 2016-11-03 DIAGNOSIS — I255 Ischemic cardiomyopathy: Secondary | ICD-10-CM | POA: Diagnosis not present

## 2016-11-03 DIAGNOSIS — I5022 Chronic systolic (congestive) heart failure: Secondary | ICD-10-CM

## 2016-11-03 NOTE — Progress Notes (Signed)
Remote ICD transmission.   

## 2016-11-04 NOTE — Progress Notes (Signed)
EPIC Encounter for ICM Monitoring  [name] is a 70 y.o. male Date: 11/04/2016 Primary Care Physican: [name], MD Primary [place] Electrophysiologist: Caryl Comes Dry Weight:203 lbs Bi-V Pacing: 66.4%                                              Heart Failure questions reviewed, pt asymptomatic  Thoracic impedance returned to normal   Labs:  08/11/2016 Creatinine 1.59, BUN 34, Potassium 3.9, Sodium 141 05/12/2016 Creatinine 1.27, BUN 22, Potassium 3.5, Sodium 144 05/05/2016 Creatinine 1.22, BUN 17, Potassium 3.2, Sodium 143 03/14/2016 Creatinine 1.39, BUN 25, Potassium 3.4, Sodium 145  Recommendations: No changes.  Reinforced low salt food choices and limiting fluid intake to < 2 liters per day. Encouraged to call for fluid symptoms.    Follow-up plan: ICM clinic phone appointment on 12/06/2016.  Copy of ICM check sent to device physician.   ICM trend: 11/03/2016       [name], RN 11/04/2016 12:41 PM

## 2016-11-07 ENCOUNTER — Ambulatory Visit
Admission: RE | Admit: 2016-11-07 | Discharge: 2016-11-07 | Disposition: A | Discharge status: Home / Self Care | Payer: Medicare Other | Source: Ambulatory Visit | Attending: General Surgery | Admitting: General Surgery

## 2016-11-07 DIAGNOSIS — K432 Incisional hernia without obstruction or gangrene: Secondary | ICD-10-CM

## 2016-11-07 MED ORDER — IOPAMIDOL (ISOVUE-300) INJECTION 61%
80.0000 mL | Freq: Once | INTRAVENOUS | Status: AC | PRN
  Administered 2016-11-07: 80 mL via INTRAVENOUS

## 2016-11-09 ENCOUNTER — Encounter: Payer: Self-pay | Admitting: Cardiology

## 2016-11-12 LAB — CUP PACEART REMOTE DEVICE CHECK
Battery Remaining Longevity: 46 mo
Battery Voltage: 2.97 V
Brady Statistic RA Percent Paced: 0 %
Date Time Interrogation Session: 20171116052204
HIGH POWER IMPEDANCE MEASURED VALUE: 60 Ohm
HighPow Impedance: 52 Ohm
Implantable Lead Implant Date: 20090226
Implantable Lead Location: 753859
Implantable Lead Model: 4194
Implantable Lead Model: 6947
Implantable Pulse Generator Implant Date: 20140929
Lead Channel Impedance Value: 323 Ohm
Lead Channel Impedance Value: 323 Ohm
Lead Channel Impedance Value: 513 Ohm
Lead Channel Sensing Intrinsic Amplitude: 14.375 mV
Lead Channel Setting Pacing Pulse Width: 0.6 ms
Lead Channel Setting Sensing Sensitivity: 0.3 mV
MDC IDC LEAD IMPLANT DT: 20090226
MDC IDC LEAD IMPLANT DT: 20090226
MDC IDC LEAD LOCATION: 753858
MDC IDC LEAD LOCATION: 753860
MDC IDC MSMT LEADCHNL LV IMPEDANCE VALUE: 646 Ohm
MDC IDC MSMT LEADCHNL LV IMPEDANCE VALUE: 817 Ohm
MDC IDC MSMT LEADCHNL LV PACING THRESHOLD AMPLITUDE: 0.625 V
MDC IDC MSMT LEADCHNL LV PACING THRESHOLD PULSEWIDTH: 0.6 ms
MDC IDC MSMT LEADCHNL RA SENSING INTR AMPL: 4 mV
MDC IDC MSMT LEADCHNL RA SENSING INTR AMPL: 4 mV
MDC IDC MSMT LEADCHNL RV IMPEDANCE VALUE: 399 Ohm
MDC IDC MSMT LEADCHNL RV SENSING INTR AMPL: 14.375 mV
MDC IDC SET LEADCHNL LV PACING AMPLITUDE: 1.75 V
MDC IDC SET LEADCHNL RV PACING AMPLITUDE: 2 V
MDC IDC SET LEADCHNL RV PACING PULSEWIDTH: 0.6 ms
MDC IDC STAT BRADY AP VP PERCENT: 0 %
MDC IDC STAT BRADY AP VS PERCENT: 0 %
MDC IDC STAT BRADY AS VP PERCENT: 64.38 %
MDC IDC STAT BRADY AS VS PERCENT: 35.62 %
MDC IDC STAT BRADY RV PERCENT PACED: 66.44 %

## 2016-11-23 NOTE — Progress Notes (Signed)
[name] Date of Birth: 1946/07/14 Medical Record [id number]  History of Present Illness: [name] is seen today for followup of atrial fibrillation and CAD.  He has a history of remote anterior myocardial infarction with an ischemic cardiomyopathy and ejection fraction of 23%. He is status post CRT D. He had a generator replacement in September 2014. He also has a history of atrial fibrillation. He is intolerant of amiodarone. His QT interval is too long for Tikosyn. He previously had attempted to increase his carvedilol dose but he developed significant hypotension.  He was admitted from 3/2-3/15/17 with a perforated duodenal ulcer with GI bleed. He required surgery with Phillip Heal patch. He was transfused. He actually did quite well from a cardiac standpoint. Some of his cardiac meds were held due to hypotension. He also had bilateral distal DVTs. He was discharged to [place] and discharged after about 8 days.  In September he had upper and lower EGD. He had some esophagitis and some nodular irregularity of the gastric mucosa. The duodenal patch was healing. There was a duodenal polyp. On lower EGD he had hemorrhoids, diverticuli, and a 5 cm polyp. Biopsies negative for malignancy. Later CT of abdomen as noted below. Significant ventral hernia. Seeing Dr. Dalbert Batman with potential plans for hernia repair.  In October his Optivol indicated increased fluid retention and lasix was increased to 40 mg bid. Optivol level improved. He reports weight at home have been stable. Breathing is "fair". He is sedentary. Dogs have died so he is not motivated to walk. Wears support hose. Was placed on losartan 25 mg last April. Not sure why this was stopped. No chest pain.    Medication List       Accurate as of 11/24/16  9:32 AM. Always use your most recent med list.          digoxin 0.125 MG tablet Commonly known as:  DIGOX Take 0.5 tablets (62.5 mcg total) by mouth daily.   fenofibrate 160 MG  tablet Take 1 tablet by mouth  daily   ferrous sulfate 325 (65 FE) MG tablet Take 325 mg by mouth 2 (two) times daily.   folic acid Q000111Q MCG tablet Commonly known as:  FOLVITE Take 400 mcg by mouth daily.   furosemide 40 MG tablet Commonly known as:  LASIX Take 1 tablet (40 mg total) by mouth 2 (two) times daily.   IRON PO Take 1 tablet by mouth 2 (two) times daily. PATIENT NOT SURE OF DOSAGE OVER THE COUNTER   metoprolol succinate 25 MG 24 hr tablet Commonly known as:  TOPROL-XL Take 25 mg by mouth daily.   MULTIVITAMIN ADULTS PO Take 1 tablet by mouth daily.   pantoprazole 40 MG tablet Commonly known as:  PROTONIX Take 2 tablets (80 mg total) by mouth 2 (two) times daily.   potassium chloride SA 20 MEQ tablet Commonly known as:  K-DUR,KLOR-CON Take 1 tablet (20 mEq total) by mouth 2 (two) times daily.   pravastatin 40 MG tablet Commonly known as:  PRAVACHOL Take 1 tablet by mouth  daily   sertraline 100 MG tablet Commonly known as:  ZOLOFT Take 100 mg by mouth daily.   vitamin C 500 MG tablet Commonly known as:  ASCORBIC ACID Take 500 mg by mouth 2 (two) times daily.   warfarin 5 MG tablet Commonly known as:  COUMADIN Take 0.5 tablets (2.5 mg total) by mouth daily at 6 PM.        Allergies  Allergen Reactions  . Keflex [Cephalexin] Shortness Of Breath and Other (See Comments)    dizziness    Past Medical History:  Diagnosis Date  . AICD (automatic cardioverter/defibrillator) present   . Anemia   . Anxiety   . CHF (congestive heart failure) ([place])   . Chronic renal insufficiency   . Coronary artery disease   . Depression   . Dyslipidemia   . Dysrhythmia    atrial fibrillation  . ED (erectile dysfunction)   . Ischemic cardiomyopathy    EF 23%  . MI (myocardial infarction) 2000   ANTERIOR, s/p PCI  . Other primary cardiomyopathies   . Persistent atrial fibrillation (HCC)    on coumadin  . Presence of permanent cardiac pacemaker   . Shortness  of breath dyspnea    on exertion    Past Surgical History:  Procedure Laterality Date  . BIV ICD GENERTAOR CHANGE OUT N/A 09/16/2013   Procedure: BIV ICD GENERTAOR CHANGE OUT;  Surgeon: [name], MD;  Location: [facility] [place] CATH LAB;  Service: Cardiovascular;  Laterality: N/A;  . BREAST LUMPECTOMY    . CARDIAC CATHETERIZATION  02/05/2008   MODERATE TO SEVERE LEFT VENTRICULAR  DYSFUNCTION WITH EF 25-30%  . CARDIAC DEFIBRILLATOR PLACEMENT  2009   MDT BI-VENTRICULAR ICD by Dr Caryl Comes  . COLONOSCOPY WITH PROPOFOL N/A 08/31/2016   Procedure: COLONOSCOPY WITH PROPOFOL;  Surgeon: Wilford Corner, MD;  Location[facility] ENDOSCOPY;  Service: Endoscopy;  Laterality: N/A;  . CORONARY ANGIOPLASTY  2000  . CORONARY STENT PLACEMENT     LAD  . CYSTOSCOPY N/A 01/25/2016   Procedure: CYSTOSCOPY;  Surgeon: Carolan Clines, MD;  Location: [facility] ORS;  Service: Urology;  Laterality: N/A;  . ESOPHAGOGASTRODUODENOSCOPY (EGD) WITH PROPOFOL N/A 08/31/2016   Procedure: ESOPHAGOGASTRODUODENOSCOPY (EGD) WITH PROPOFOL;  Surgeon: Wilford Corner, MD;  Location: [facility] ENDOSCOPY;  Service: Endoscopy;  Laterality: N/A;  . IMPLANTABLE CARDIOVERTER DEFIBRILLATOR GENERATOR CHANGE  2014  . INSERT / REPLACE / [place]     ICD  . LAPAROTOMY N/A 02/22/2016   Procedure: EXPLORATORY LAPAROTOMY WITH  PATCH OF DUODENAL ULCER;  Surgeon: Rolm Bookbinder, MD;  Location: [place];  Service: General;  Laterality: N/A;  . MULTIPLE TOOTH EXTRACTIONS    . TRANSTHORACIC ECHOCARDIOGRAM  12/2010   EF 30-35%  . TRANSURETHRAL RESECTION OF PROSTATE N/A 01/25/2016   Procedure: TRANSURETHRAL RESECTION OF THE PROSTATE (TURP);  Surgeon: Carolan Clines, MD;  Location: [facility] ORS;  Service: Urology;  Laterality: N/A;  . WRIST FRACTURE SURGERY      History  Smoking Status  . Former Smoker  . Types: Cigarettes  . Quit date: 12/23/1998  Smokeless Tobacco  . Never Used    History  Alcohol Use No    Family History  Problem Relation Age of Onset  .  Breast cancer Mother   . Depression Mother   . Heart attack Father   . Breast cancer Sister     Review of Systems: As noted in history of present illness. All other systems were reviewed and are negative.  Physical Exam: BP 120/81   Pulse 85   Ht 5\' 10"  (1.778 m)   Wt 206 lb (93.4 kg)   BMI 29.56 kg/m  He is a pleasant white male in no acute distress. HEENT: Unremarkable No JVD or bruits. Lungs: Clear Cardiovascular: Regular rate and rhythm, normal S1 and S2. Grade 2/6 systolic murmur at apex. Abdomen: Soft, nontender. No masses or bruits. Extremities: Trace edema below the knees. Large venous varicosities. Support  hose in place.  Skin: Warm and dry Neuro: Alert oriented x3. Cranial nerves II through XII are intact.  LABORATORY DATA:  Lab Results  Component Value Date   WBC 8.9 05/05/2016   HGB 9.6 (L) 05/05/2016   HCT 31.8 (L) 05/05/2016   PLT 418 (H) 05/05/2016   GLUCOSE 92 08/11/2016   CHOL 112 02/27/2016   TRIG 53 02/29/2016   HDL 32 (L) 02/27/2016   LDLDIRECT 137.6 01/06/2012   LDLCALC 66 02/27/2016   ALT 18 03/07/2016   AST 23 03/07/2016   NA 141 08/11/2016   K 3.9 08/11/2016   CL 102 08/11/2016   CREATININE 1.59 (H) 08/11/2016   BUN 34 (H) 08/11/2016   CO2 27 08/11/2016   TSH 3.082 02/18/2016   PSA 3.19 01/19/2016   INR 2.94 (H) 03/16/2016   CT ABDOMEN AND PELVIS WITH CONTRAST  TECHNIQUE: Multidetector CT imaging of the abdomen and pelvis was performed using the standard protocol following bolus administration of intravenous contrast.  CONTRAST:  14mL ISOVUE-300 IOPAMIDOL (ISOVUE-300) INJECTION 61%  COMPARISON:  CT scan abdomen and pelvis 02/22/2016  FINDINGS: Lower chest: 6 mm nodular opacity in the posterior periphery of the left lower lobe (image 8 series 5). Atelectasis in the lung base obscures this region on the prior study. Incompletely imaged cardiac rhythm maintenance device with both internal and external leads. Left ventricular  dilatation with fibro fatty remodeling of the septal and apical left ventricular wall and slight aneurysmal outpouching consistent with a remote prior myocardial infarction and resultant scarring. No pericardial effusion. Unremarkable distal thoracic esophagus.  Hepatobiliary: Unremarkable hepatic contour and morphology. The portal veins are patent. No discrete hepatic lesion. Peripherally calcified gallstone noted in the gallbladder neck consistent with cholelithiasis. No gallbladder wall thickening or pericholecystic fluid.  Pancreas: Unremarkable. No pancreatic ductal dilatation or surrounding inflammatory changes.  Spleen: Normal in size without focal abnormality.  Adrenals/Urinary Tract: Normal adrenal glands. Multiple circumscribed fluid attenuation renal lesions bilaterally consistent with simple cysts of varying size. No focal enhancing renal lesion, hydronephrosis or nephrolithiasis.  Stomach/Bowel: Colonic diverticular disease without CT evidence of active inflammation. No focal bowel wall thickening or evidence of obstruction. Normal appendix in the right lower quadrant. Partial herniation of the anterior wall of the transverse colon. Several loops of small bowel are closely apposed to the undersurface of the anterior abdominal wall in the region of the prior midline incision suggesting adhesive disease.  Vascular/Lymphatic: Atherosclerotic calcifications throughout the abdominal aorta. No evidence of aneurysm or significant stenosis. No focal venous abnormality.  Reproductive: Prostate is unremarkable.  Other: Diastases recti. Omental fat containing midline ventral hernia superior to the emboli kiss. Additionally, there is a small portion of the anterior wall of the colon at the superior aspect of the herniation defect.  Musculoskeletal: No acute or significant osseous findings.  IMPRESSION: 1. Midline ventral hernia superior to the umbilicus  containing predominantly omental fat and also a small portion of the anterior wall of the transverse colon. No evidence of associated colonic wall thickening or obstruction. 2. Inferior to the ventral hernia is a region of diastases recti with multiple loops of small bowel closely apposed to the peritoneal surface suggesting the presence of adhesions. 3. Solitary 6 mm pulmonary nodule identified in the left lower lobe. This region was obscured by atelectasis on the prior CT scan. Non-contrast chest CT at 6-12 months is recommended. If the nodule is stable at time of repeat CT, then future CT at 18-24 months (from today's scan)  is considered optional for low-risk patients, but is recommended for high-risk patients. This recommendation follows the consensus statement: Guidelines for Management of Incidental Pulmonary Nodules Detected on CT Images: From the Fleischner Society 2017; Radiology 2017; 284:228-243. 4. Cardiomegaly with left ventricular scarring and small apical aneurysm formation consistent with prior myocardial infarction. 5. 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 6. Cholelithiasis. 7.  Aortic Atherosclerosis (ICD10-170.0) 8. Bilateral renal cysts.   Electronically Signed   By: Jacqulynn Cadet M.D.   On: 11/07/2016   Assessment / Plan: 1. Atrial fibrillation. Rate control appears to be adequate. Will continue with metoprolol for rate control.  On coumadin and INR checked by primary care.   2. Coronary disease with remote anterior myocardial infarction. He is asymptomatic.  3. Chronic systolic congestive heart failure. He is well compensated today. Currently on metoprolol, lasix, and digoxin.   Ejection fraction is 20-25% in January 2017. Would like to resume either an ARB or Entresto. He is concerned about cost of Entresto. Will have him see our Pharm D to investigate cost of Entresto and to initiate if affordable. Will need to watch  BP closely but I think he would tolerate well. If he is not a candidate for Entresto we will resume losartan. Continue sodium restriction.   4. s/p perforated duodenal ulcer and bleeding. S/p surgery with duodenal patch. Now with significant ventral hernia. Surgery recommended and he will be seeing Dr. Dalbert Batman in 2 months. I will see at that time as well and if stable I think he would be a suitable surgical candidate.   5.  S/p CRT-D. Followed in device clinic.   6. Bilateral DVT- right soleal, left posterial tibial and peroneal veins. On coumadin. No symptoms or physical findings now.  Medication reviewed and updated list as noted.   We will follow up in 2 months for reassessment and potential titration of CHF meds/ surgical clearance  Rayleen Wyrick Martinique MD, [facility]  11/24/2016

## 2016-11-24 ENCOUNTER — Encounter: Payer: Self-pay | Admitting: Cardiology

## 2016-11-24 ENCOUNTER — Ambulatory Visit (INDEPENDENT_AMBULATORY_CARE_PROVIDER_SITE_OTHER): Payer: Medicare Other | Admitting: Cardiology

## 2016-11-24 VITALS — BP 120/81 | HR 85 | Ht 70.0 in | Wt 206.0 lb

## 2016-11-24 DIAGNOSIS — I480 Paroxysmal atrial fibrillation: Secondary | ICD-10-CM

## 2016-11-24 DIAGNOSIS — I2510 Atherosclerotic heart disease of native coronary artery without angina pectoris: Secondary | ICD-10-CM

## 2016-11-24 DIAGNOSIS — I5022 Chronic systolic (congestive) heart failure: Secondary | ICD-10-CM

## 2016-11-24 DIAGNOSIS — I255 Ischemic cardiomyopathy: Secondary | ICD-10-CM

## 2016-11-24 DIAGNOSIS — E7800 Pure hypercholesterolemia, unspecified: Secondary | ICD-10-CM

## 2016-11-24 DIAGNOSIS — Z9861 Coronary angioplasty status: Secondary | ICD-10-CM

## 2016-11-24 DIAGNOSIS — I10 Essential (primary) hypertension: Secondary | ICD-10-CM

## 2016-11-24 NOTE — Patient Instructions (Signed)
Continue your current therapy  I will have you see our pharmacist about starting Entresto  I will see you in 2 months.

## 2016-11-24 NOTE — Progress Notes (Signed)
Patient ID: [name]                 DOB: Oct 07, 1946                      MRN: [id number]     HPI: [name] is a 70 y.o. male patient of Dr. Martinique with PMH below who presents today for medication titration. [name] was discussed as his most recent visit. His concern was the cost. If the medication is cost prohibitive for him it was decided that he should go back on losartan (which he was previously on and it is unclear why this was stopped). He reports that this was stopped after a hospital admission and he just never restarted as the PA he saw post-hospital agreed with discontinuation.   He presents today with his wife. He states that he is interested in starting [place] after his conversation with Dr. Martinique, but is worried about the cost of the medication. He believes that the medication may be a higher tier than his other medications. We had lengthy conversation about this medication being brand only. We also discussed differences between ARB/ARNI and ARB alone.   He reports that he has been told several times that "he is a special case." He questions why his carvedilol and eplerenone were discontinued. Discussed that carvedilol was discontinued due to hypotension. It appears the eplerenone has been off his profile for quite some time.    Cardiac Hx: HTN, CAD, CHF, Afib, Cardiomyopathy, EF20-25%, Sleep apnea, GI bleed, CKD stage 3, HLD  Current meds:  Furosemide 40mg  BID Metoprolol succinate 25mg  daily Digoxin 0.125mg  daily  Previously tried:  He was previously on losartan and it is unclear why this was stopped.   Family History: His father had a heart attack.   Social History: Former smoker, Quit in 2000. Denies smokeless tobacco and alcohol  Home BP readings: He has not been monitoring but does have a cuff at home.   Wt Readings from Last 3 Encounters:  11/24/16 206 lb (93.4 kg)  08/31/16 202 lb (91.6 kg)  06/14/16 200 lb 3.2 oz (90.8 kg)   BP Readings from Last  3 Encounters:  11/25/16 122/74  11/24/16 120/81  08/31/16 121/69   Pulse Readings from Last 3 Encounters:  11/25/16 85  11/24/16 85  08/31/16 86    Renal function: CrCl cannot be calculated (Patient's most recent lab result is older than the maximum 21 days allowed.).  Past Medical History:  Diagnosis Date  . AICD (automatic cardioverter/defibrillator) present   . Anemia   . Anxiety   . CHF (congestive heart failure) ([place])   . Chronic renal insufficiency   . Coronary artery disease   . Depression   . Dyslipidemia   . Dysrhythmia    atrial fibrillation  . ED (erectile dysfunction)   . Ischemic cardiomyopathy    EF 23%  . MI (myocardial infarction) 2000   ANTERIOR, s/p PCI  . Other primary cardiomyopathies   . Persistent atrial fibrillation (HCC)    on coumadin  . Presence of permanent cardiac pacemaker   . Shortness of breath dyspnea    on exertion    Current Outpatient Prescriptions on File Prior to Visit  Medication Sig Dispense Refill  . digoxin (DIGOX) 0.125 MG tablet Take 0.5 tablets (62.5 mcg total) by mouth daily. 45 tablet 2  . fenofibrate 160 MG tablet Take 1 tablet by mouth  daily 90 tablet 0  .  ferrous sulfate 325 (65 FE) MG tablet Take 325 mg by mouth 2 (two) times daily.    . folic acid (FOLVITE) Q000111Q MCG tablet Take 400 mcg by mouth daily.    . furosemide (LASIX) 40 MG tablet Take 1 tablet (40 mg total) by mouth 2 (two) times daily. 90 tablet 3  . metoprolol succinate (TOPROL-XL) 25 MG 24 hr tablet Take 25 mg by mouth daily.    . Multiple Vitamins-Minerals (MULTIVITAMIN ADULTS PO) Take 1 tablet by mouth daily.    . pantoprazole (PROTONIX) 40 MG tablet Take 2 tablets (80 mg total) by mouth 2 (two) times daily.    . potassium chloride SA (K-DUR,KLOR-CON) 20 MEQ tablet Take 1 tablet (20 mEq total) by mouth 2 (two) times daily. 90 tablet 3  . pravastatin (PRAVACHOL) 40 MG tablet Take 1 tablet by mouth  daily 90 tablet 0  . sertraline (ZOLOFT) 100 MG tablet  Take 100 mg by mouth daily.      . vitamin C (ASCORBIC ACID) 500 MG tablet Take 500 mg by mouth 2 (two) times daily.    Marland Kitchen warfarin (COUMADIN) 5 MG tablet Take 0.5 tablets (2.5 mg total) by mouth daily at 6 PM. 30 tablet 0   No current facility-administered medications on file prior to visit.     Allergies  Allergen Reactions  . Keflex [Cephalexin] Shortness Of Breath and Other (See Comments)    dizziness    Blood pressure 122/74, pulse 85, SpO2 99 %.   Assessment/Plan: Hypertension: BMET today for baseline Scr. After lengthy discussion patient would like to try Entresto. If he is unable to afford he is willing to start losartan or valsartan whichever Dr. Martinique prefers. Will start lowest dose of Entresto 24-26mg  BID. Two week supply of samples provided as well as a printed prescription. He will call to find out copay of medication. If he determines that this is cost-prohibitive for him he will call our office. Have asked he monitor his pressure twice daily until follow up in about 2 weeks. Repeat BMET with next office visit. Follow-up in 2 weeks for pressure check and potential titration.     Thank you, Lelan Pons. Patterson Hammersmith, [facility]  11/25/2016 10:09 AM

## 2016-11-25 ENCOUNTER — Ambulatory Visit (INDEPENDENT_AMBULATORY_CARE_PROVIDER_SITE_OTHER): Payer: Medicare Other | Admitting: Pharmacist

## 2016-11-25 ENCOUNTER — Encounter: Payer: Self-pay | Admitting: Pharmacist

## 2016-11-25 VITALS — BP 122/74 | HR 85

## 2016-11-25 DIAGNOSIS — I5022 Chronic systolic (congestive) heart failure: Secondary | ICD-10-CM

## 2016-11-25 DIAGNOSIS — I10 Essential (primary) hypertension: Secondary | ICD-10-CM

## 2016-11-25 MED ORDER — SACUBITRIL-VALSARTAN 24-26 MG PO TABS: 1.0000 | ORAL_TABLET | Freq: Two times a day (BID) | ORAL | 2 refills | Status: DC

## 2016-11-25 NOTE — Patient Instructions (Addendum)
Return for a follow up appointment in 2 weeks  Check your blood pressure at home daily (if able) and keep record of the readings.  Take your BP meds as follows: Start Entresto 24-26mg  twice daily  PLEASE call [phone] when you determine the price and if it will be cost-prohibitive for you   Bring all of your meds, your BP cuff and your record of home blood pressures to your next appointment.  Exercise as you're able, try to walk approximately 30 minutes per day.  Keep salt intake to a minimum, especially watch canned and prepared boxed foods.  Eat more fresh fruits and vegetables and fewer canned items.  Avoid eating in fast food restaurants.    HOW TO TAKE YOUR BLOOD PRESSURE: . Rest 5 minutes before taking your blood pressure. .  Don't smoke or drink caffeinated beverages for at least 30 minutes before. . Take your blood pressure before (not after) you eat. . Sit comfortably with your back supported and both feet on the floor (don't cross your legs). . Elevate your arm to heart level on a table or a desk. . Use the proper sized cuff. It should fit smoothly and snugly around your bare upper arm. There should be enough room to slip a fingertip under the cuff. The bottom edge of the cuff should be 1 inch above the crease of the elbow. . Ideally, take 3 measurements at one sitting and record the average.

## 2016-11-26 LAB — BASIC METABOLIC PANEL
BUN: 29 mg/dL — AB (ref 7–25)
CALCIUM: 9.8 mg/dL (ref 8.6–10.3)
CO2: 29 mmol/L (ref 20–31)
CREATININE: 1.76 mg/dL — AB (ref 0.70–1.25)
Chloride: 102 mmol/L (ref 98–110)
Glucose, Bld: 85 mg/dL (ref 65–99)
Potassium: 4.2 mmol/L (ref 3.5–5.3)
Sodium: 144 mmol/L (ref 135–146)

## 2016-11-29 ENCOUNTER — Telehealth: Payer: Self-pay | Admitting: Pharmacist

## 2016-11-29 NOTE — Telephone Encounter (Signed)
Patient called clinic on 11/28/16 to report recent BP measrement with Entresto.     Saturday 12/9 = 95/66    Sunday 12/10 = 96/55 in AM  **No symptoms of hypotension**  ==> change batteries on device<==  Sunday 12/10 (after batteries changed) = 119/67 (pulse 77)  Monday 12/11 = 101/61 (pulse 81) left arm ; 98/62 right arm  Patient instructed to continue Entresto as prescribed, continue close monitoring of BP , keep BP log and to monitor and report any signs or symptoms of hypotension.

## 2016-12-02 ENCOUNTER — Other Ambulatory Visit: Payer: Self-pay

## 2016-12-02 DIAGNOSIS — R7989 Other specified abnormal findings of blood chemistry: Secondary | ICD-10-CM

## 2016-12-02 DIAGNOSIS — I10 Essential (primary) hypertension: Secondary | ICD-10-CM

## 2016-12-06 ENCOUNTER — Telehealth: Payer: Self-pay | Admitting: Cardiology

## 2016-12-06 ENCOUNTER — Ambulatory Visit (INDEPENDENT_AMBULATORY_CARE_PROVIDER_SITE_OTHER): Payer: Medicare Other

## 2016-12-06 DIAGNOSIS — Z95810 Presence of automatic (implantable) cardiac defibrillator: Secondary | ICD-10-CM

## 2016-12-06 DIAGNOSIS — I5022 Chronic systolic (congestive) heart failure: Secondary | ICD-10-CM | POA: Diagnosis not present

## 2016-12-06 NOTE — Telephone Encounter (Signed)
Spoke with pt and reminded pt of remote transmission that is due today. Pt verbalized understanding.   

## 2016-12-07 ENCOUNTER — Ambulatory Visit (INDEPENDENT_AMBULATORY_CARE_PROVIDER_SITE_OTHER): Payer: Medicare Other | Admitting: Pharmacist

## 2016-12-07 VITALS — BP 98/52 | HR 86

## 2016-12-07 DIAGNOSIS — I10 Essential (primary) hypertension: Secondary | ICD-10-CM | POA: Diagnosis not present

## 2016-12-07 NOTE — Patient Instructions (Addendum)
Return for a follow up appointment in 3-4 weeks  Check your blood pressure at home daily (if able) and keep record of the readings.  Take your BP meds as follows: We will call you with results of blood work  Bring all of your meds, your BP cuff and your record of home blood pressures to your next appointment.  Exercise as you're able, try to walk approximately 30 minutes per day.  Keep salt intake to a minimum, especially watch canned and prepared boxed foods.  Eat more fresh fruits and vegetables and fewer canned items.  Avoid eating in fast food restaurants.    HOW TO TAKE YOUR BLOOD PRESSURE: . Rest 5 minutes before taking your blood pressure. .  Don't smoke or drink caffeinated beverages for at least 30 minutes before. . Take your blood pressure before (not after) you eat. . Sit comfortably with your back supported and both feet on the floor (don't cross your legs). . Elevate your arm to heart level on a table or a desk. . Use the proper sized cuff. It should fit smoothly and snugly around your bare upper arm. There should be enough room to slip a fingertip under the cuff. The bottom edge of the cuff should be 1 inch above the crease of the elbow. . Ideally, take 3 measurements at one sitting and record the average.

## 2016-12-07 NOTE — Progress Notes (Signed)
Patient ID: [name]                 DOB: [birth year]                      MRN: [id number]     HPI: NAT WALLMAN is a 70 y.o. male patient of Dr. Martinique with PMH below who presents today for medication titration. [name] was discussed as his most recent visit. His concern was the cost. If the medication is cost prohibitive for him it was decided that he should go back on losartan (which he was previously on and it is unclear why this was stopped). He reports that this was stopped after a hospital admission and he just never restarted as the PA he saw post-hospital agreed with discontinuation. At our most recent visit he was started on Entresto 24-26mg  BID. Baseline BMET was slightly elevated at previous visit. He was to follow up to determine if medication would be cost-effective for him in the new year.   He reports that he is able to afford Entresto and would prefer a 90 day supply be sent to his mail order pharmacy. He reports he has noticed no difference in how he feels since starting [place]. He has not noticed any appreciable change in dizziness. He denies orthostatic symptoms.   Both he and his wife state they would prefer him to be on the best therapy available, but understand the need to discontinue medication if kidney function continues to decline.    Cardiac Hx: HTN, CAD, CHF, Afib, Cardiomyopathy, EF20-25%, Sleep apnea, GI bleed, CKD stage 3, HLD  Current meds:  Furosemide 40mg  BID Metoprolol succinate 25mg  daily Digoxin 0.125mg  daily  Previously tried:  He was previously on losartan and it is unclear why this was stopped.   Family History: His father had a heart attack.   Social History: Former smoker, Quit in 2000. Denies smokeless tobacco and alcohol  Home BP readings: His home measurements range 91-121/59-72. Majority of his measurements are 100s/60s. HR WNLs - several were recorded as "irregular" likely due to his Afib.   Wt Readings from Last 3 Encounters:    11/24/16 206 lb (93.4 kg)  08/31/16 202 lb (91.6 kg)  06/14/16 200 lb 3.2 oz (90.8 kg)   BP Readings from Last 3 Encounters:  12/07/16 (!) 98/52  11/25/16 122/74  11/24/16 120/81   Pulse Readings from Last 3 Encounters:  12/07/16 86  11/25/16 85  11/24/16 85    Renal function: CrCl cannot be calculated (Unknown ideal weight.).  Past Medical History:  Diagnosis Date  . AICD (automatic cardioverter/defibrillator) present   . Anemia   . Anxiety   . CHF (congestive heart failure) ([place])   . Chronic renal insufficiency   . Coronary artery disease   . Depression   . Dyslipidemia   . Dysrhythmia    atrial fibrillation  . ED (erectile dysfunction)   . Ischemic cardiomyopathy    EF 23%  . MI (myocardial infarction) 2000   ANTERIOR, s/p PCI  . Other primary cardiomyopathies   . Persistent atrial fibrillation (HCC)    on coumadin  . Presence of permanent cardiac pacemaker   . Shortness of breath dyspnea    on exertion    Current Outpatient Prescriptions on File Prior to Visit  Medication Sig Dispense Refill  . digoxin (DIGOX) 0.125 MG tablet Take 0.5 tablets (62.5 mcg total) by mouth daily. 45 tablet 2  . fenofibrate 160  MG tablet Take 1 tablet by mouth  daily 90 tablet 0  . ferrous sulfate 325 (65 FE) MG tablet Take 325 mg by mouth 2 (two) times daily.    . folic acid (FOLVITE) Q000111Q MCG tablet Take 400 mcg by mouth daily.    . furosemide (LASIX) 40 MG tablet Take 1 tablet (40 mg total) by mouth 2 (two) times daily. (Patient taking differently: Take 40 mg by mouth 2 (two) times daily. Patient is taking 1 tablet (40 mg total) once a day as instructed by pharmacist 12/08/2016.) 90 tablet 3  . metoprolol succinate (TOPROL-XL) 25 MG 24 hr tablet Take 25 mg by mouth daily.    . Multiple Vitamins-Minerals (MULTIVITAMIN ADULTS PO) Take 1 tablet by mouth daily.    . pantoprazole (PROTONIX) 40 MG tablet Take 2 tablets (80 mg total) by mouth 2 (two) times daily.    . pravastatin  (PRAVACHOL) 40 MG tablet Take 1 tablet by mouth  daily 90 tablet 0  . sacubitril-valsartan (ENTRESTO) 24-26 MG Take 1 tablet by mouth 2 (two) times daily. 60 tablet 2  . sertraline (ZOLOFT) 100 MG tablet Take 100 mg by mouth daily.      . vitamin C (ASCORBIC ACID) 500 MG tablet Take 500 mg by mouth 2 (two) times daily.    Marland Kitchen warfarin (COUMADIN) 5 MG tablet Take 0.5 tablets (2.5 mg total) by mouth daily at 6 PM. 30 tablet 0   No current facility-administered medications on file prior to visit.     Allergies  Allergen Reactions  . Keflex [Cephalexin] Shortness Of Breath and Other (See Comments)    dizziness    Blood pressure (!) 98/52, pulse 86, SpO2 97 %.   Assessment/Plan: Hypertension:  Repeat BMET today. If returns unchanged then continue Entresto at low dose. Doubtful that dose will be able to be increased. Follow up closely due to kidney function and low to normal blood pressures. Follow up in 3-4 weeks.    Thank you, Lelan Pons. Patterson Hammersmith, [place]

## 2016-12-08 ENCOUNTER — Telehealth: Payer: Self-pay | Admitting: Pharmacist Clinician (PhC)/ Clinical Pharmacy Specialist

## 2016-12-08 ENCOUNTER — Telehealth: Payer: Self-pay

## 2016-12-08 DIAGNOSIS — I5022 Chronic systolic (congestive) heart failure: Secondary | ICD-10-CM

## 2016-12-08 LAB — BASIC METABOLIC PANEL
BUN: 39 mg/dL — ABNORMAL HIGH (ref 7–25)
CALCIUM: 9.7 mg/dL (ref 8.6–10.3)
CO2: 28 mmol/L (ref 20–31)
Chloride: 102 mmol/L (ref 98–110)
Creat: 1.71 mg/dL — ABNORMAL HIGH (ref 0.70–1.25)
Glucose, Bld: 105 mg/dL — ABNORMAL HIGH (ref 65–99)
Potassium: 4.1 mmol/L (ref 3.5–5.3)
SODIUM: 141 mmol/L (ref 135–146)

## 2016-12-08 MED ORDER — POTASSIUM CHLORIDE CRYS ER 20 MEQ PO TBCR: 20.0000 meq | EXTENDED_RELEASE_TABLET | Freq: Every day | ORAL | 3 refills | Status: DC

## 2016-12-08 NOTE — Progress Notes (Signed)
Received call back from patient.  He is asymptomatic. He sent updated remote transmission which suggests fluid accumulation is improving and impedance is trending toward baseline. Reviewed fluid symptoms he should report.    He received call from Tommy Medal, [facility] instructing him to decrease Furosemide to 40 mg once a day.  He stated he did not receive any instructions to decrease Potassium so he continues with same dosage.  He has a follow up [facility] appointment on 12/28/2016.  Advised would recheck fluid levels on 12/27/2016 to check effectiveness of taking Furosemide 1 tablet daily.   Weight today: 203 lbs    ICM Trend for today.

## 2016-12-08 NOTE — Telephone Encounter (Signed)
Called patient, LMOM having him cut potassium back to once daily with furosemide dose.

## 2016-12-08 NOTE — Telephone Encounter (Signed)
Remote ICM transmission received.  Attempted patient call and left detailed message to return call regarding transmission  

## 2016-12-08 NOTE — Telephone Encounter (Signed)
SCr stable but elevated at 1.71.  Reviewed with Kerin Ransom PA, will have patient cut furosemide dose to 40 mg qd instead of BID and repeat BMET in 2 weeks.  Patient voiced understanding.  He has scheduled appointment for Jan 10, so will have blood drawn 1 week prior to that appt.

## 2016-12-08 NOTE — Progress Notes (Signed)
EPIC Encounter for ICM Monitoring  [name] is a 70 y.o. male Date: 12/08/2016 Primary Care Physican: [name], MD Primary [place] Electrophysiologist: Faustino Congress Weight:unknown Bi-V Pacing: 67%  Attempted ICM call and unable to reach.  Left message to return call regarding transmission.  Transmission reviewed.   Thoracic impedance abnormal since 12/9 and fluid index is slightly > threshold   Labs:  12/07/2016 Creatinine 1.71, BUN 39, Potassium 4.1, Sodium 141 11/25/2016 Creatinine 1.76, BUN 29, Potassium 4.2, Sodium 144 08/11/2016 Creatinine 1.59, BUN 34, Potassium 3.9, Sodium 141 05/12/2016 Creatinine 1.27, BUN 22, Potassium 3.5, Sodium 144 05/05/2016 Creatinine 1.22, BUN 17, Potassium 3.2, Sodium 143 03/14/2016 Creatinine 1.39, BUN 25, Potassium 3.4, Sodium 145  Recommendations:  Left message for patient to send another transmission today to get update on fluid level since 12/19   Follow-up plan: ICM clinic phone appointment on 12/27/2016.  Copy of ICM check sent to primary cardiologist and device physician.   ICM trend: 12/06/2016       [name], RN 12/08/2016 9:21 AM

## 2016-12-08 NOTE — Telephone Encounter (Signed)
-----   Message from Erskine Emery, [facility] sent at 12/07/2016  4:43 PM EST ----- Mr. Muldrew had a BMET today after starting Entresto 2 weeks ago. I continued him on the low dose of Entresto based on BP, but if kidney function worsened will need to discontinue. Can one of you please follow up with him tomorrow since I will be out of the office.

## 2016-12-14 ENCOUNTER — Encounter: Payer: Self-pay | Admitting: Pharmacist

## 2016-12-21 ENCOUNTER — Other Ambulatory Visit: Payer: Self-pay | Admitting: Cardiology

## 2016-12-22 ENCOUNTER — Telehealth: Payer: Self-pay | Admitting: Cardiology

## 2016-12-22 LAB — BASIC METABOLIC PANEL
BUN: 30 mg/dL — ABNORMAL HIGH (ref 7–25)
CALCIUM: 9.5 mg/dL (ref 8.6–10.3)
CO2: 26 mmol/L (ref 20–31)
Chloride: 106 mmol/L (ref 98–110)
Creat: 1.63 mg/dL — ABNORMAL HIGH (ref 0.70–1.25)
GLUCOSE: 113 mg/dL — AB (ref 65–99)
Potassium: 4 mmol/L (ref 3.5–5.3)
Sodium: 145 mmol/L (ref 135–146)

## 2016-12-22 NOTE — Telephone Encounter (Signed)
Returned call to patient lab results given. 

## 2016-12-22 NOTE — Telephone Encounter (Signed)
New message ° ° ° ° ° °Returning a call to the nurse to get test results °

## 2016-12-27 ENCOUNTER — Telehealth: Payer: Self-pay

## 2016-12-27 ENCOUNTER — Ambulatory Visit (INDEPENDENT_AMBULATORY_CARE_PROVIDER_SITE_OTHER): Payer: Medicare Other

## 2016-12-27 DIAGNOSIS — I5022 Chronic systolic (congestive) heart failure: Secondary | ICD-10-CM

## 2016-12-27 DIAGNOSIS — Z95810 Presence of automatic (implantable) cardiac defibrillator: Secondary | ICD-10-CM

## 2016-12-27 NOTE — Telephone Encounter (Signed)
Remote ICM transmission received.  Attempted patient call and left detailed message to return call regarding transmission  

## 2016-12-27 NOTE — Progress Notes (Signed)
Patient returned call.  He denied fluid symptoms at this time.  He confirmed he is taking Furosemide 40 mg daily as instructed by [facility] pharmacist on 12/08/2016.   Advised the remote transmissions suggests he still have some fluid accumulation.  He has an appointment with Georgina Peer the pharmacist tomorrow.   Current weight 207 lbs.  He stated he has had some lightheadedness and hands are very cold with taking Entresto.   Advised will send copy of ICM note to so pharmacist and physicians can review.

## 2016-12-27 NOTE — Progress Notes (Signed)
EPIC Encounter for ICM Monitoring  [name] is a 71 y.o. male Date: 12/27/2016 Primary Care Physican: [name], MD Primary [place] Electrophysiologist: Faustino Congress Weight:unknown Bi-V Pacing: 64%       Attempted ICM cal and unable to reach.  Left message to return call regarding transmission.  Transmission reviewed.   Thoracic impedance abnormal suggesting fluid accumulation since approximately 11/27/16.  [facility] Pharmacist, Tommy Medal adjusted last dose of Furosemide on 12/08/2016 to 40 mg daily.     Labs:  12/21/2016 Creatinine 1.63, BUN 30, Potassium 4.0, Sodium 145 12/07/2016 Creatinine 1.71, BUN 39, Potassium 4.1, Sodium 141 11/25/2016 Creatinine 1.76, BUN 29, Potassium 4.2, Sodium 144 08/11/2016 Creatinine 1.59, BUN 34, Potassium 3.9, Sodium 141 05/12/2016 Creatinine 1.27, BUN 22, Potassium 3.5, Sodium 144 05/05/2016 Creatinine 1.22, BUN 17, Potassium 3.2, Sodium 143 03/14/2016 Creatinine 1.39, BUN 25, Potassium 3.4, Sodium 145  Recommendations:  NONE - Unable to reach.  Follow-up plan: ICM clinic phone appointment on 01/03/2017 to recheck fluid levels.  Patient had appt with [facility] pharmacist on 12/28/2016.  Copy of ICM check sent to [facility] pharmacist, Kerin Ransom, [place], Dr Martinique and Dr Caryl Comes for review and recommendations.     3 month ICM trend : 12/27/2016   1 Year ICM trend:      [name], RN 12/27/2016 10:41 AM

## 2016-12-28 ENCOUNTER — Ambulatory Visit (INDEPENDENT_AMBULATORY_CARE_PROVIDER_SITE_OTHER): Payer: Medicare Other | Admitting: Pharmacist Clinician (PhC)/ Clinical Pharmacy Specialist

## 2016-12-28 ENCOUNTER — Encounter: Payer: Self-pay | Admitting: Pharmacist Clinician (PhC)/ Clinical Pharmacy Specialist

## 2016-12-28 VITALS — BP 106/66 | HR 92

## 2016-12-28 DIAGNOSIS — I5022 Chronic systolic (congestive) heart failure: Secondary | ICD-10-CM | POA: Diagnosis not present

## 2016-12-28 DIAGNOSIS — I5032 Chronic diastolic (congestive) heart failure: Secondary | ICD-10-CM

## 2016-12-28 NOTE — Patient Instructions (Signed)
Increase furosemide to 60 mg once daily.   If you continue to have lightheadedness please check your BP more frequently.  Let our office know if the readings are consistently [id number] systolic.  Repeat BMET next week.  Will set you up to see Dr. Caryl Comes at his next available appointment.

## 2016-12-28 NOTE — Progress Notes (Signed)
[name], [name] is not my patient, but our clinical pharmacist asked me to take a look at his situation today. She was unable to increase his interest though due to low blood pressure. I spoke briefly to the patient and looked at his ECG and last download for [place] [facility].  He only has 64% biV pacing and steadily worsening Optivol.  ECG shows A. fib with RVR and a lot of native AV conduction or ineffective triggered pacing.  Not much option for better rate control since he is already on digoxin and the blood pressure is borderline low. I noticed that he was "turned down" for pulmonary vein isolation. He probably needs either amiodarone or AV node ablation. Thanks, [employer]

## 2016-12-28 NOTE — Assessment & Plan Note (Signed)
Patient feeling well today with no complaints.  Will not be able to increase Entresto dose above the 24/26 mg because of low blood pressures.  Will have him increase furosemide to 60 mg, which he prefers to take as 40 mg in the morning and 20 mg mid-day.  Reviewed his information with Dr. Sallyanne Kuster, who made the suggestion that patient should see Dr. Jens Som in the EP clinic.  Patient appointment scheduled for next Thurs Jan 18.  He will get a BMET that day at the [facility].  I asked that he call before then should his lightheadedness become problematic.  He will continue to monitor his BP from home

## 2016-12-28 NOTE — Progress Notes (Signed)
Patient ID: [name]                 DOB: [birth year]                      MRN: [id number]     HPI: [name] is a 71 y.o. male patient of Dr. Martinique with PMH below who presents today for medication titration follow up.  On Dec 8 he was started on low dose Entresto 24/26 mg twice daily.  After 2 weeks he returned to the office for follow up.  His blood pressure was low at 98/52, but he was feeling fine.  We elected not to increase his dose of Entresto.  A BMET drawn on the same day showed his SCr stable but elevated at 1.71.  It was decided to decrease the furosemide to 40 mg qd instead of bid.    A note from Sharman Cheek yesterday indicates that he is Bi-V pacing at 67% and thoracic impedance indicates fluid index is slightly above threshold.    Today in the office he reports his only problem is feeling occasional lightheadedness, without any equilibrium problems.  Most of the events were short-lived.  He reports DOE, but no lower extremity edema or chest pressure at rest.  He does wear support stockings daily.    Cardiac Hx: HTN, CAD, CHF, Afib, Cardiomyopathy, EF20-25%, Sleep apnea, GI bleed, CKD stage 3, HLD  Current meds:  Furosemide 40mg  qd Metoprolol succinate 25mg  daily Digoxin 0.125mg  daily  Previously tried:  He was previously on losartan and it is unclear why this was stopped.   Family History: His father had a heart attack.   Social History: Former smoker, Quit in 2000. Denies smokeless tobacco and alcohol  Home BP readings: His home measurements range 93-127/57-74. The average of 14 readings was 109-67. HR 69-117, mostly in the 80-90 range.  Home cuff was found to be within 5 points of office readings.  Wt Readings from Last 3 Encounters:  11/24/16 206 lb (93.4 kg)  08/31/16 202 lb (91.6 kg)  06/14/16 200 lb 3.2 oz (90.8 kg)   BP Readings from Last 3 Encounters:  12/28/16 106/66  12/07/16 (!) 98/52  11/25/16 122/74   Pulse Readings from Last 3 Encounters:    12/28/16 92  12/07/16 86  11/25/16 85    Renal function: CrCl cannot be calculated (Unknown ideal weight.).  Past Medical History:  Diagnosis Date  . AICD (automatic cardioverter/defibrillator) present   . Anemia   . Anxiety   . CHF (congestive heart failure) ([place])   . Chronic renal insufficiency   . Coronary artery disease   . Depression   . Dyslipidemia   . Dysrhythmia    atrial fibrillation  . ED (erectile dysfunction)   . Ischemic cardiomyopathy    EF 23%  . MI (myocardial infarction) 2000   ANTERIOR, s/p PCI  . Other primary cardiomyopathies   . Persistent atrial fibrillation (HCC)    on coumadin  . Presence of permanent cardiac pacemaker   . Shortness of breath dyspnea    on exertion    Current Outpatient Prescriptions on File Prior to Visit  Medication Sig Dispense Refill  . digoxin (DIGOX) 0.125 MG tablet Take 0.5 tablets (62.5 mcg total) by mouth daily. 45 tablet 2  . fenofibrate 160 MG tablet Take 1 tablet by mouth  daily 90 tablet 0  . ferrous sulfate 325 (65 FE) MG tablet Take 325 mg by  mouth 2 (two) times daily.    . folic acid (FOLVITE) Q000111Q MCG tablet Take 400 mcg by mouth daily.    . furosemide (LASIX) 40 MG tablet Take 1 tablet (40 mg total) by mouth 2 (two) times daily. (Patient taking differently: Take 40 mg by mouth 2 (two) times daily. Patient is taking 1 tablet (40 mg total) once a day as instructed by pharmacist 12/08/2016.) 90 tablet 3  . metoprolol succinate (TOPROL-XL) 25 MG 24 hr tablet Take 25 mg by mouth daily.    . Multiple Vitamins-Minerals (MULTIVITAMIN ADULTS PO) Take 1 tablet by mouth daily.    . pantoprazole (PROTONIX) 40 MG tablet Take 2 tablets (80 mg total) by mouth 2 (two) times daily.    . potassium chloride SA (K-DUR,KLOR-CON) 20 MEQ tablet Take 1 tablet (20 mEq total) by mouth daily. 90 tablet 3  . pravastatin (PRAVACHOL) 40 MG tablet Take 1 tablet by mouth  daily 90 tablet 0  . sacubitril-valsartan (ENTRESTO) 24-26 MG Take 1  tablet by mouth 2 (two) times daily. 60 tablet 2  . sertraline (ZOLOFT) 100 MG tablet Take 100 mg by mouth daily.      . vitamin C (ASCORBIC ACID) 500 MG tablet Take 500 mg by mouth 2 (two) times daily.    Marland Kitchen warfarin (COUMADIN) 5 MG tablet Take 0.5 tablets (2.5 mg total) by mouth daily at 6 PM. 30 tablet 0   No current facility-administered medications on file prior to visit.     Allergies  Allergen Reactions  . Keflex [Cephalexin] Shortness Of Breath and Other (See Comments)    dizziness    Blood pressure 106/66, pulse 92.   Assessment/Plan:  Patient feeling well today with no complaints.  Will not be able to increase Entresto dose above the 24/26 mg because of low blood pressures.  Will have him increase furosemide to 60 mg, which he prefers to take as 40 mg in the morning and 20 mg mid-day.  Reviewed his information with Dr. Sallyanne Kuster, who made the suggestion that patient should see Dr. Jens Som in the EP clinic.  Patient appointment scheduled for next Thurs Jan 18.  He will get a BMET that day at the [facility].  I asked that he call before then should his lightheadedness become problematic.  He will continue to monitor his BP from home.     Thank you, Tommy Medal PharmD  [place]

## 2016-12-29 MED ORDER — FUROSEMIDE 40 MG PO TABS: ORAL_TABLET | ORAL | 1 refills | Status: DC

## 2016-12-29 NOTE — Progress Notes (Signed)
Reviewed current transmission with Dr Caryl Comes in the office.  Patient has office appointment with Dr Caryl Comes on 01/03/2017.  No changes today.

## 2017-01-02 ENCOUNTER — Other Ambulatory Visit: Payer: Self-pay | Admitting: Cardiology

## 2017-01-02 NOTE — Telephone Encounter (Signed)
Rx(s) sent to pharmacy electronically.  

## 2017-01-03 ENCOUNTER — Ambulatory Visit (INDEPENDENT_AMBULATORY_CARE_PROVIDER_SITE_OTHER): Payer: Medicare Other | Admitting: Internal Medicine

## 2017-01-03 ENCOUNTER — Encounter: Payer: Self-pay | Admitting: Internal Medicine

## 2017-01-03 ENCOUNTER — Ambulatory Visit (INDEPENDENT_AMBULATORY_CARE_PROVIDER_SITE_OTHER): Payer: Medicare Other

## 2017-01-03 ENCOUNTER — Telehealth: Payer: Self-pay | Admitting: Cardiology

## 2017-01-03 VITALS — BP 90/50 | HR 98 | Ht 70.0 in | Wt 208.8 lb

## 2017-01-03 DIAGNOSIS — I5022 Chronic systolic (congestive) heart failure: Secondary | ICD-10-CM | POA: Diagnosis not present

## 2017-01-03 DIAGNOSIS — I480 Paroxysmal atrial fibrillation: Secondary | ICD-10-CM | POA: Diagnosis not present

## 2017-01-03 DIAGNOSIS — I255 Ischemic cardiomyopathy: Secondary | ICD-10-CM

## 2017-01-03 DIAGNOSIS — Z95810 Presence of automatic (implantable) cardiac defibrillator: Secondary | ICD-10-CM | POA: Diagnosis not present

## 2017-01-03 NOTE — Patient Instructions (Signed)
Medication Instructions: - Your physician recommends that you continue on your current medications as directed. Please refer to the Current Medication list given to you today.  Labwork: - none ordered  Procedures/Testing: - please call Dr. Olin Pia nurse, [facility] at [phone] if you decide to proceed with the AV node ablation.  Follow-Up: - pending.  Any Additional Special Instructions Will Be Listed Below (If Applicable).     If you need a refill on your cardiac medications before your next appointment, please call your pharmacy.

## 2017-01-03 NOTE — Progress Notes (Signed)
Patient Care Team: Christain Sacramento, MD as PCP - General (Family Medicine)   HPI  [name] is a 71 y.o. male Seen in followup of afib for which he recently saw Dr Greggory Brandy and the was decision to not pursue P VI He also has ichemic cardiomyopathy and HFrEF with prior CRT-D from which he garnered little benefit. CXR demonstrates good lead location  He struggles with depression    In the past it has not been clear that   his symptoms of dyspnea are related to his atrial fibrillation. Clearly he has no palpitations. Is also not certain that he can recall previously when his atrial fibrillation is paroxysmal that his symptoms were worse while in AF. He is now permanent     In the past we decided to pursue augmented rate control with the introduction of low-dose beta blockers.  More recently the ability to control his heart rate has been a challenge. There was a recent effort intravenous Entresto. This has been complicated by low blood pressure. There has been concurrent elevation and heart rates.  In the past the absence of significant symptoms we also decided that pursuing ablation either of the AV node or of the pulmonary veins was not appropriate.  Hospitalized 3/17 for GI bleed 2/2 ulcers requring surgery for perforation  now back on warfain  Echocardiogram 1/17 EF 20-25%  He has been more euvolemic of late but is still struggled with heart failure and volume overload  Last metabolic profile was 123456. Dig level 0.8 on 12/15  Past Medical History:  Diagnosis Date  . AICD (automatic cardioverter/defibrillator) present   . Anemia   . Anxiety   . CHF (congestive heart failure) ([place])   . Chronic renal insufficiency   . Coronary artery disease   . Depression   . Dyslipidemia   . Dysrhythmia    atrial fibrillation  . ED (erectile dysfunction)   . Ischemic cardiomyopathy    EF 23%  . MI (myocardial infarction) 2000   ANTERIOR, s/p PCI  . Other primary cardiomyopathies   .  Persistent atrial fibrillation (HCC)    on coumadin  . Presence of permanent cardiac pacemaker   . Shortness of breath dyspnea    on exertion    Past Surgical History:  Procedure Laterality Date  . BIV ICD GENERTAOR CHANGE OUT N/A 09/16/2013   Procedure: BIV ICD GENERTAOR CHANGE OUT;  Surgeon: [name], MD;  Location: [facility] CATH LAB;  Service: Cardiovascular;  Laterality: N/A;  . BREAST LUMPECTOMY    . CARDIAC CATHETERIZATION  02/05/2008   MODERATE TO SEVERE LEFT VENTRICULAR  DYSFUNCTION WITH EF 25-30%  . CARDIAC DEFIBRILLATOR PLACEMENT  2009   MDT BI-VENTRICULAR ICD by Dr Caryl Comes  . COLONOSCOPY WITH PROPOFOL N/A 08/31/2016   Procedure: COLONOSCOPY WITH PROPOFOL;  Surgeon: Wilford Corner, MD;  Location: [facility] ENDOSCOPY;  Service: Endoscopy;  Laterality: N/A;  . CORONARY ANGIOPLASTY  2000  . CORONARY STENT PLACEMENT     LAD  . CYSTOSCOPY N/A 01/25/2016   Procedure: CYSTOSCOPY;  Surgeon: Carolan Clines, MD;  Location: [facility] ORS;  Service: Urology;  Laterality: N/A;  . ESOPHAGOGASTRODUODENOSCOPY (EGD) WITH PROPOFOL N/A 08/31/2016   Procedure: ESOPHAGOGASTRODUODENOSCOPY (EGD) WITH PROPOFOL;  Surgeon: Wilford Corner, MD;  Location: [facility] ENDOSCOPY;  Service: Endoscopy;  Laterality: N/A;  . IMPLANTABLE CARDIOVERTER DEFIBRILLATOR GENERATOR CHANGE  2014  . INSERT / REPLACE / [place]     ICD  . LAPAROTOMY N/A 02/22/2016  Procedure: EXPLORATORY LAPAROTOMY WITH  PATCH OF DUODENAL ULCER;  Surgeon: Rolm Bookbinder, MD;  Location: [facility];  Service: General;  Laterality: N/A;  . MULTIPLE TOOTH EXTRACTIONS    . TRANSTHORACIC ECHOCARDIOGRAM  12/2010   EF 30-35%  . TRANSURETHRAL RESECTION OF PROSTATE N/A 01/25/2016   Procedure: TRANSURETHRAL RESECTION OF THE PROSTATE (TURP);  Surgeon: Carolan Clines, MD;  Location: [facility] ORS;  Service: Urology;  Laterality: N/A;  . WRIST FRACTURE SURGERY      Current Outpatient Prescriptions  Medication Sig Dispense Refill  . digoxin (DIGOX) 0.125 MG tablet  Take 0.5 tablets (62.5 mcg total) by mouth daily. 45 tablet 2  . fenofibrate 160 MG tablet TAKE 1 TABLET BY MOUTH  DAILY 90 tablet 3  . ferrous sulfate 325 (65 FE) MG tablet Take 325 mg by mouth 2 (two) times daily.    . folic acid (FOLVITE) Q000111Q MCG tablet Take 400 mcg by mouth daily.    . furosemide (LASIX) 40 MG tablet Take 1 tablet each morning and 1/2 tablet each afternooon 135 tablet 1  . metoprolol succinate (TOPROL-XL) 25 MG 24 hr tablet Take 25 mg by mouth daily.    . Multiple Vitamins-Minerals (MULTIVITAMIN ADULTS PO) Take 1 tablet by mouth daily.    . pantoprazole (PROTONIX) 40 MG tablet Take 2 tablets (80 mg total) by mouth 2 (two) times daily.    . potassium chloride SA (K-DUR,KLOR-CON) 20 MEQ tablet Take 1 tablet (20 mEq total) by mouth daily. 90 tablet 3  . pravastatin (PRAVACHOL) 40 MG tablet TAKE 1 TABLET BY MOUTH  DAILY 90 tablet 3  . sacubitril-valsartan (ENTRESTO) 24-26 MG Take 1 tablet by mouth 2 (two) times daily. 60 tablet 2  . sertraline (ZOLOFT) 100 MG tablet Take 100 mg by mouth daily.      . vitamin C (ASCORBIC ACID) 500 MG tablet Take 500 mg by mouth 2 (two) times daily.    Marland Kitchen warfarin (COUMADIN) 5 MG tablet Take 0.5 tablets (2.5 mg total) by mouth daily at 6 PM. 30 tablet 0   No current facility-administered medications for this visit.     Allergies  Allergen Reactions  . Keflex [Cephalexin] Shortness Of Breath and Other (See Comments)    dizziness    Review of Systems negative except from HPI and PMH  Physical Exam BP (!) 90/50   Pulse 98   Ht 5\' 10"  (1.778 m)   Wt 208 lb 12.8 oz (94.7 kg)   SpO2 98%   BMI 29.96 kg/m  Well developed and nourished in no acute distress HENT normal Neck supple with JVP-8  k and full without bruits Clear Irregularly irregular rate and rhythm with controlled ventricular response, no murmurs or gallops Abd-soft with active BS without hepatomegaly abd sscar No Clubbing cyanosis 3+ edema Skin-warm and dry A & Oriented   Grossly normal sensory and motor function  ECG Atrial fibrillation with intermittent biventricular pacing   Upright QRS V1  Assessment and  Plan  Atrial fibrillation increasingly rapid rate  Congestive heart failure-chronic-systolic  Ischemic cardiomyopathy  Hypotension  CRT-D.  The patient's device was interrogated.  The information was reviewed. No changes were made in the programming.    Depression  Irritability  Had a very lengthy discussion with pt and his wife.  I will discuss this also with Dr. Martinique. The interrogation of his device demonstrates a significant increase in his average heart rate from the 70-80 range to now about 110. This may be concurred with the  introduction of Entresto hypotension and perhaps reflex sympathetic tone enhancing AV nodal conduction. Augmented rate control is not possible. With his average rates where they are I think we exceed the scope of the RACE-2 TRIAL     I will discuss this with Dr. Martinique but I think the options would be discontinuing Entresto and trying low-dose ARB so as to be able to use a beta blocker and see if we can control rates as previously, AV junction ablation, amiodarone for rate control. There are recent data demonstrating improved outcomes hospitalizations in mortality in patients who undergo AV junction ablation in conjunction with biventricular pacing. There are some discrepancy on these data.  He is also scheduled for open laparotomy to address an incisional hernia.  Certainly having his HF most compensated minimizes the risk  Control of HR would help this  More than 50% of 45 min was spent in counseling related to the above

## 2017-01-03 NOTE — Telephone Encounter (Signed)
Left msg to relay requested information to patient's case manager and advised to call if further questions/clarification needed.

## 2017-01-03 NOTE — Telephone Encounter (Signed)
[facility] Heart Failure ) is calling to get Mr. Mcshea ejection fraction . Please call   Thanks

## 2017-01-03 NOTE — Progress Notes (Signed)
EPIC Encounter for ICM Monitoring  [name] is a 71 y.o. male Date: 01/03/2017 Primary Care Physican: [name], MD Primary [place] Electrophysiologist: Faustino Congress Weight:unknown Bi-V Pacing: 62.8%  OBSERVATIONS (2)  V. Pacing less than 90%.  Ventricular sensing episodes averaged 1.7 hr/day since the last session 12/27/2016       Office appointment today with Dr Caryl Comes so no call to patient  Thoracic impedance returned to normal and fluid index improving  Labs:  12/21/2016 Creatinine 1.63, BUN 30, Potassium 4.0, Sodium 145 12/07/2016 Creatinine 1.71, BUN 39, Potassium 4.1, Sodium 141 11/25/2016 Creatinine 1.76, BUN 29, Potassium 4.2, Sodium 144 08/11/2016 Creatinine 1.59, BUN 34, Potassium 3.9, Sodium 141 05/12/2016 Creatinine 1.27, BUN 22, Potassium 3.5, Sodium 144 05/05/2016 Creatinine 1.22, BUN 17, Potassium 3.2, Sodium 143 03/14/2016 Creatinine 1.39, BUN 25, Potassium 3.4, Sodium 145  Recommendations:  In office appointment today and Dr Caryl Comes will make any recommendations needed.   Follow-up plan: ICM clinic phone appointment on 01/24/2017, day before office visit with Dr Martinique 01/25/2017.  Copy of ICM check sent to primary cardiologist and device physician.   3 month ICM trend: 01/03/2017   1 Year ICM trend:      [name], RN 01/03/2017 8:52 AM

## 2017-01-06 ENCOUNTER — Telehealth: Payer: Self-pay | Admitting: Internal Medicine

## 2017-01-06 DIAGNOSIS — I480 Paroxysmal atrial fibrillation: Secondary | ICD-10-CM

## 2017-01-06 DIAGNOSIS — Z01812 Encounter for preprocedural laboratory examination: Secondary | ICD-10-CM

## 2017-01-06 LAB — CUP PACEART INCLINIC DEVICE CHECK
Battery Remaining Longevity: 40 mo
Battery Voltage: 2.96 V
Brady Statistic AP VP Percent: 0 %
Brady Statistic AS VS Percent: 39.64 %
Brady Statistic RV Percent Paced: 63.65 %
HIGH POWER IMPEDANCE MEASURED VALUE: 63 Ohm
HighPow Impedance: 85 Ohm
Implantable Lead Implant Date: 20090226
Implantable Lead Implant Date: 20090226
Implantable Lead Location: 753858
Implantable Lead Model: 6947
Implantable Pulse Generator Implant Date: 20140929
Lead Channel Impedance Value: 342 Ohm
Lead Channel Impedance Value: 836 Ohm
Lead Channel Pacing Threshold Amplitude: 0.625 V
Lead Channel Sensing Intrinsic Amplitude: 15.875 mV
Lead Channel Sensing Intrinsic Amplitude: 3.75 mV
Lead Channel Sensing Intrinsic Amplitude: 6.125 mV
Lead Channel Setting Pacing Amplitude: 1.75 V
Lead Channel Setting Sensing Sensitivity: 0.3 mV
MDC IDC LEAD IMPLANT DT: 20090226
MDC IDC LEAD LOCATION: 753859
MDC IDC LEAD LOCATION: 753860
MDC IDC MSMT LEADCHNL LV IMPEDANCE VALUE: 646 Ohm
MDC IDC MSMT LEADCHNL LV PACING THRESHOLD PULSEWIDTH: 0.6 ms
MDC IDC MSMT LEADCHNL RA IMPEDANCE VALUE: 551 Ohm
MDC IDC MSMT LEADCHNL RV IMPEDANCE VALUE: 323 Ohm
MDC IDC MSMT LEADCHNL RV IMPEDANCE VALUE: 380 Ohm
MDC IDC MSMT LEADCHNL RV SENSING INTR AMPL: 11.5 mV
MDC IDC SESS DTM: 20180116220109
MDC IDC SET LEADCHNL LV PACING PULSEWIDTH: 0.6 ms
MDC IDC SET LEADCHNL RV PACING AMPLITUDE: 2 V
MDC IDC SET LEADCHNL RV PACING PULSEWIDTH: 0.6 ms
MDC IDC STAT BRADY AP VS PERCENT: 0 %
MDC IDC STAT BRADY AS VP PERCENT: 60.36 %
MDC IDC STAT BRADY RA PERCENT PACED: 0 %

## 2017-01-06 NOTE — Telephone Encounter (Signed)
Follow Up:    Pt have decided that he will have the treatment that Dr Caryl Comes told him about on 01-03-17.

## 2017-01-06 NOTE — Telephone Encounter (Signed)
I called and spoke with the patient. He is ready to schedule his AV node ablation. He is agreeable with doing this on 01/25/17 with Dr. Caryl Comes.  I advised him I will call him back next week to confirm the time and go over instructions with him. He is agreeable.

## 2017-01-11 ENCOUNTER — Encounter: Payer: Self-pay | Admitting: *Deleted

## 2017-01-11 NOTE — Telephone Encounter (Signed)
I called and spoke with the patient's wife (DPR) and made her aware that the patient's procedure for 01/25/17 is scheduled for 11:30 am. He will need to arrive at 9:30 am. Detailed letter of instructions to be mailed to the patient.  He will come on 01/18/17 for his lab work to be done.

## 2017-01-13 ENCOUNTER — Telehealth: Payer: Self-pay | Admitting: Internal Medicine

## 2017-01-13 NOTE — Telephone Encounter (Signed)
I spoke with the patient. He is aware that our resulting agency for labs in the office is now [facility]. He will come here on 01/18/17 for his pre-procedure labs.

## 2017-01-13 NOTE — Telephone Encounter (Signed)
New Message  Pt voiced wanting to speak with nurse regarding labs and needing order for labs for [facility] due to [facility] currently being out of network.  Please f/u with pt

## 2017-01-13 NOTE — Telephone Encounter (Signed)
I left a message for the patient to call. 

## 2017-01-18 ENCOUNTER — Other Ambulatory Visit: Payer: Medicare Other | Admitting: *Deleted

## 2017-01-18 ENCOUNTER — Other Ambulatory Visit: Payer: Medicare Other

## 2017-01-18 DIAGNOSIS — I480 Paroxysmal atrial fibrillation: Secondary | ICD-10-CM

## 2017-01-18 DIAGNOSIS — Z01812 Encounter for preprocedural laboratory examination: Secondary | ICD-10-CM

## 2017-01-18 LAB — CBC WITH DIFFERENTIAL/PLATELET
BASOS ABS: 0 10*3/uL (ref 0.0–0.2)
BASOS: 0 %
EOS (ABSOLUTE): 0.1 10*3/uL (ref 0.0–0.4)
EOS: 1 %
HEMATOCRIT: 42.5 % (ref 37.5–51.0)
HEMOGLOBIN: 14.3 g/dL (ref 13.0–17.7)
IMMATURE GRANS (ABS): 0 10*3/uL (ref 0.0–0.1)
Immature Granulocytes: 0 %
LYMPHS ABS: 2.3 10*3/uL (ref 0.7–3.1)
Lymphs: 35 %
MCH: 27.6 pg (ref 26.6–33.0)
MCHC: 33.6 g/dL (ref 31.5–35.7)
MCV: 82 fL (ref 79–97)
MONOCYTES: 8 %
Monocytes Absolute: 0.5 10*3/uL (ref 0.1–0.9)
NEUTROS ABS: 3.5 10*3/uL (ref 1.4–7.0)
Neutrophils: 56 %
Platelets: 214 10*3/uL (ref 150–379)
RBC: 5.18 x10E6/uL (ref 4.14–5.80)
RDW: 16.5 % — ABNORMAL HIGH (ref 12.3–15.4)
WBC: 6.4 10*3/uL (ref 3.4–10.8)

## 2017-01-18 LAB — PROTIME-INR
INR: 2.1 — ABNORMAL HIGH (ref 0.8–1.2)
Prothrombin Time: 21.1 s — ABNORMAL HIGH (ref 9.1–12.0)

## 2017-01-18 LAB — BASIC METABOLIC PANEL
BUN / CREAT RATIO: 17 (ref 10–24)
BUN: 31 mg/dL — AB (ref 8–27)
CHLORIDE: 100 mmol/L (ref 96–106)
CO2: 24 mmol/L (ref 18–29)
Calcium: 9.5 mg/dL (ref 8.6–10.2)
Creatinine, Ser: 1.78 mg/dL — ABNORMAL HIGH (ref 0.76–1.27)
GFR, EST AFRICAN AMERICAN: 44 mL/min/{1.73_m2} — AB (ref >59)
GFR, EST NON AFRICAN AMERICAN: 38 mL/min/{1.73_m2} — AB (ref >59)
Glucose: 96 mg/dL (ref 65–99)
Potassium: 4 mmol/L (ref 3.5–5.2)
Sodium: 144 mmol/L (ref 134–144)

## 2017-01-19 IMAGING — RF DG UGI W/ GASTROGRAFIN
14 of 16 series · 14 of 16 positions shown · non-contrast
Comparison: CT abdomen pelvis dated 02/22/2016

CLINICAL DATA: Exploratory laparotomy with patch of duodenum ulcer
on 02/22/2016

EXAM:
WATER SOLUBLE UPPER GI SERIES
TECHNIQUE: Single-column upper GI series was performed using water soluble
contrast.

[Series 3: fluoro_iodine_singleshot_bw · 0.18mm/px · 1 of 1 slices shown (1 of 13)]
[im 1/1]
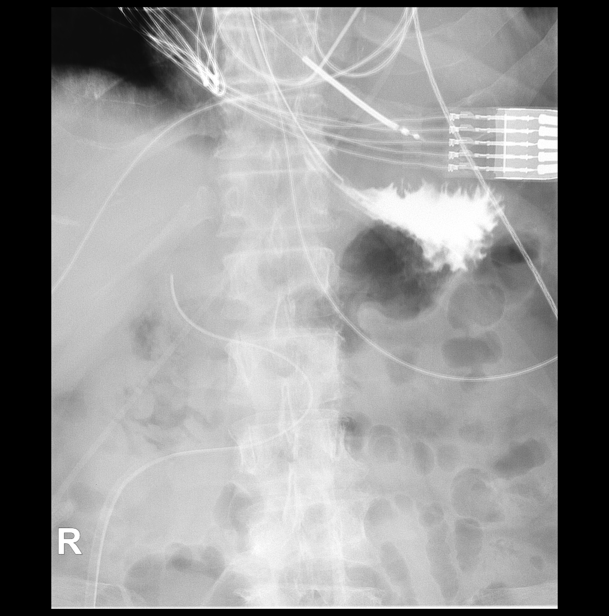

[Series 4: fluoro_iodine_singleshot_bw · 0.18mm/px · 1 of 1 slices shown (2 of 13)]
[im 1/1]
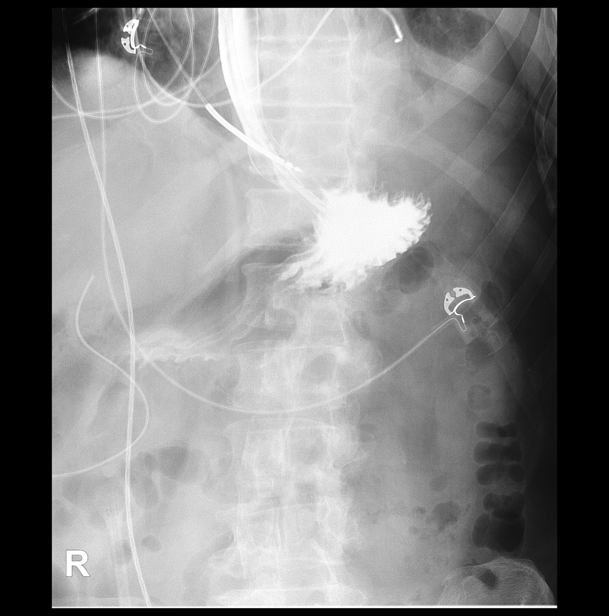

[Series 5: fluoro_iodine_singleshot_bw · 0.18mm/px · 1 of 1 slices shown (3 of 13)]
[im 1/1]
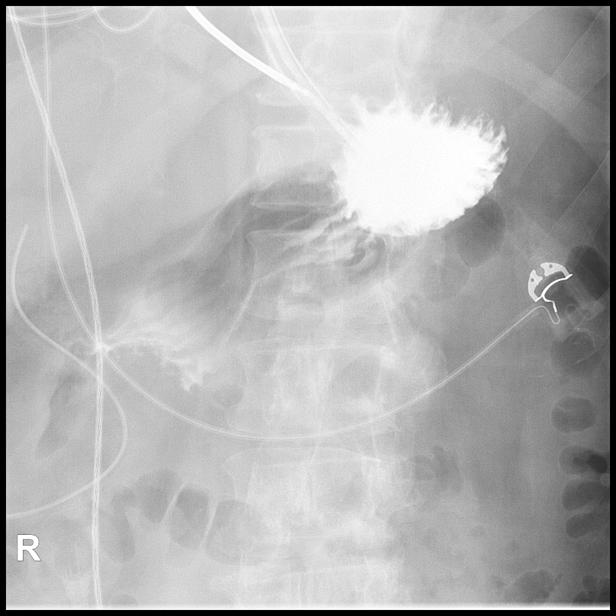

[Series 7: fluoro_iodine_singleshot_bw · 0.18mm/px · 1 of 1 slices shown (4 of 13)]
[im 1/1]
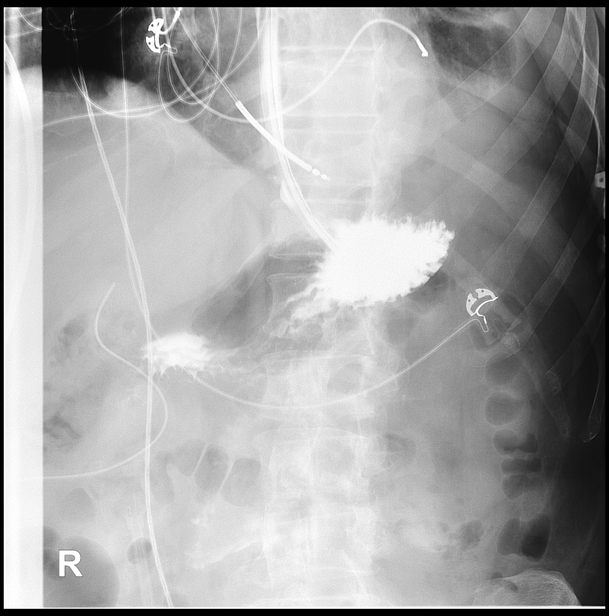

[Series 8: cp_standard · 0.27mm/px · 1 of 1 slices shown]
[im 1/1]
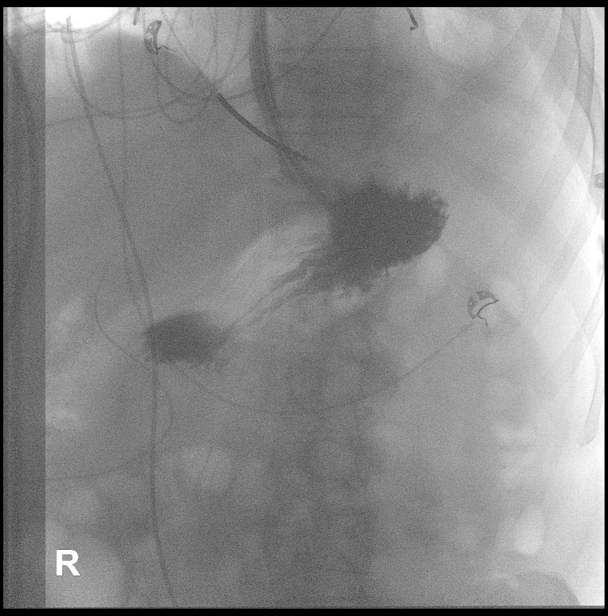

[Series 9: fluoro_iodine_singleshot_bw · 0.18mm/px · 1 of 1 slices shown (5 of 13)]
[im 1/1]
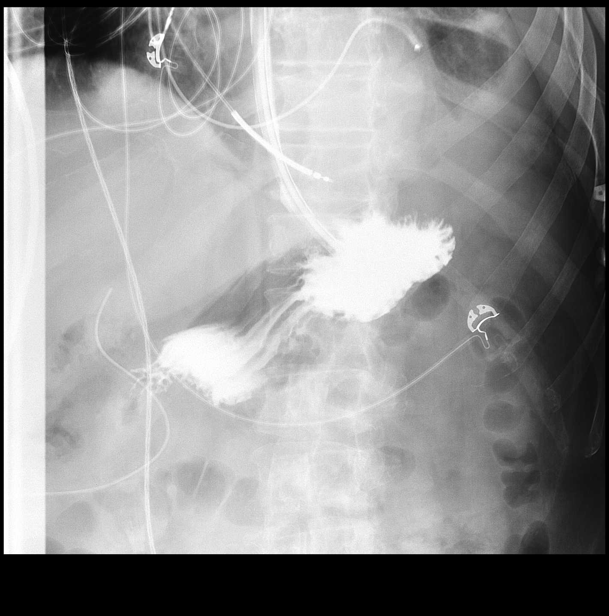

[Series 10: fluoro_iodine_singleshot_bw · 0.18mm/px · 1 of 1 slices shown (6 of 13)]
[im 1/1]
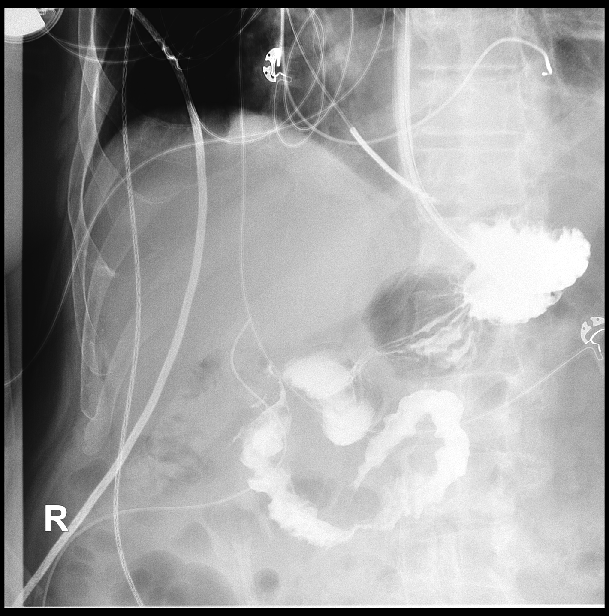

[Series 11: fluoro_iodine_singleshot_bw · 0.18mm/px · 1 of 1 slices shown (7 of 13)]
[im 1/1]
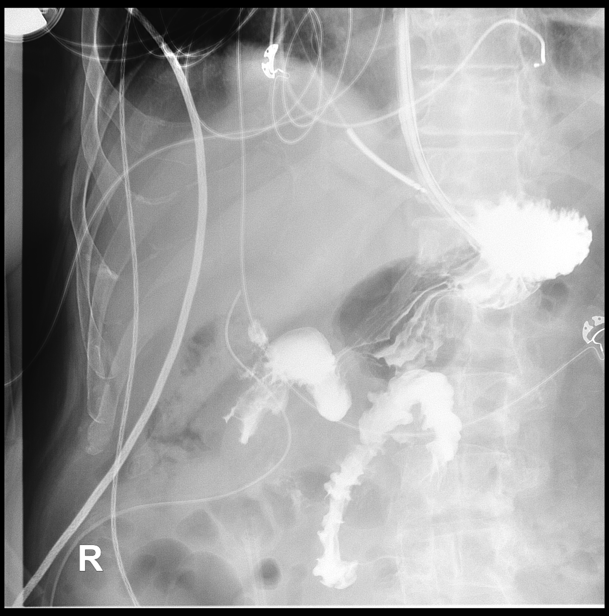

[Series 12: fluoro_iodine_singleshot_bw · 0.18mm/px · 1 of 1 slices shown (8 of 13)]
[im 1/1]
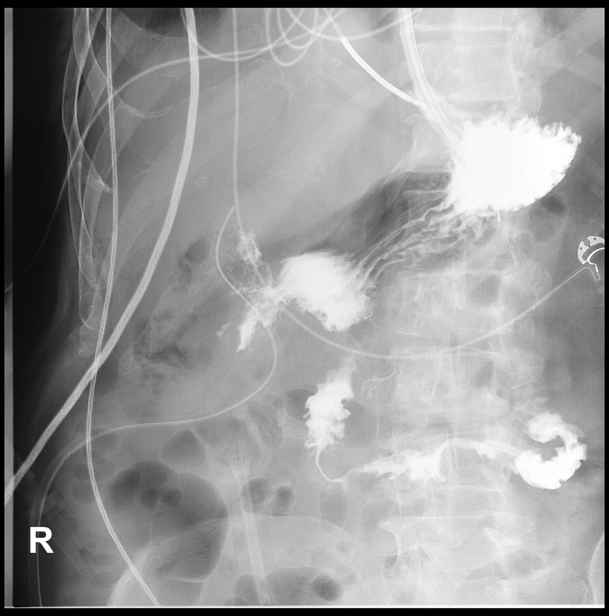

[Series 13: fluoro_iodine_singleshot_bw · 0.18mm/px · 1 of 1 slices shown (9 of 13)]
[im 1/1]
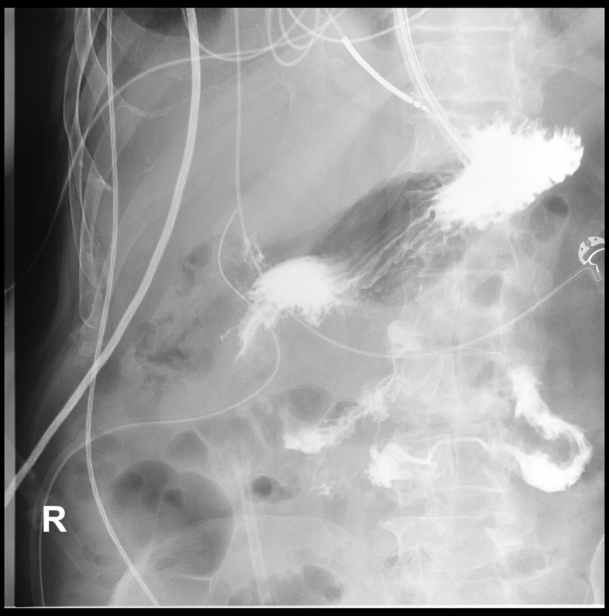

[Series 15: fluoro_iodine_singleshot_bw · 0.18mm/px · 1 of 1 slices shown (10 of 13)]
[im 1/1]
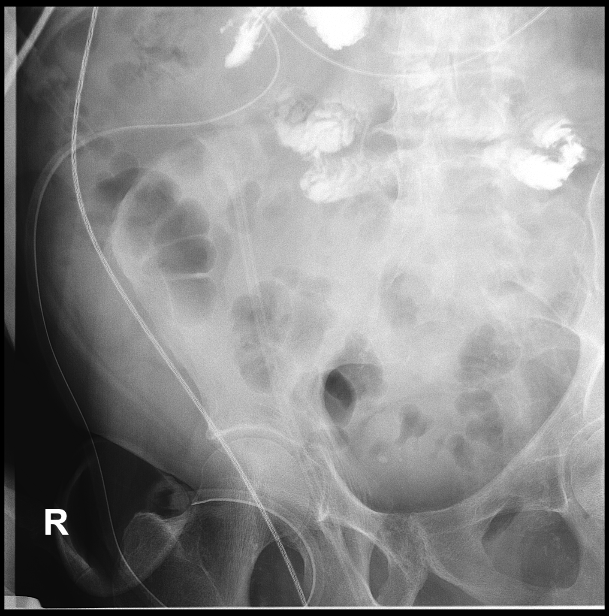

[Series 16: fluoro_iodine_singleshot_bw · 0.18mm/px · 1 of 1 slices shown (11 of 13)]
[im 1/1]
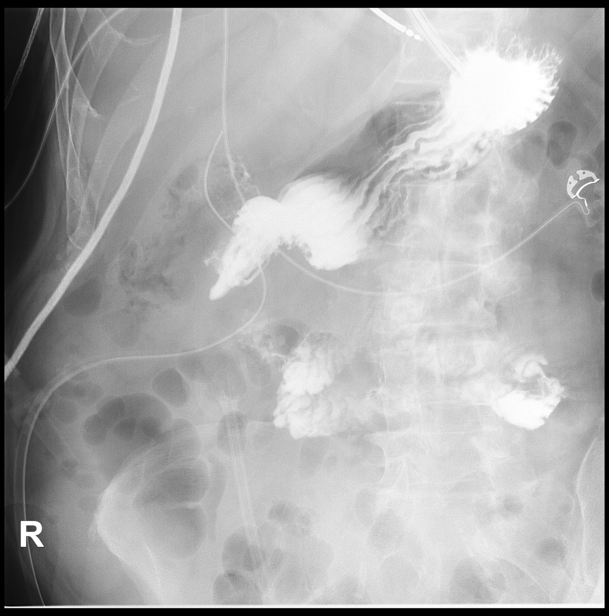

[Series 17: fluoro_iodine_singleshot_bw · 0.18mm/px · 1 of 1 slices shown (12 of 13)]
[im 1/1]
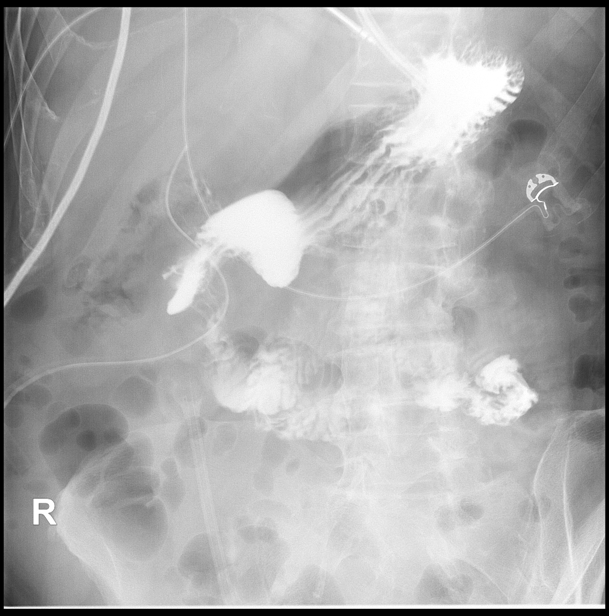

[Series 18: fluoro_iodine_singleshot_bw · 0.18mm/px · 1 of 1 slices shown (13 of 13)]
[im 1/1]
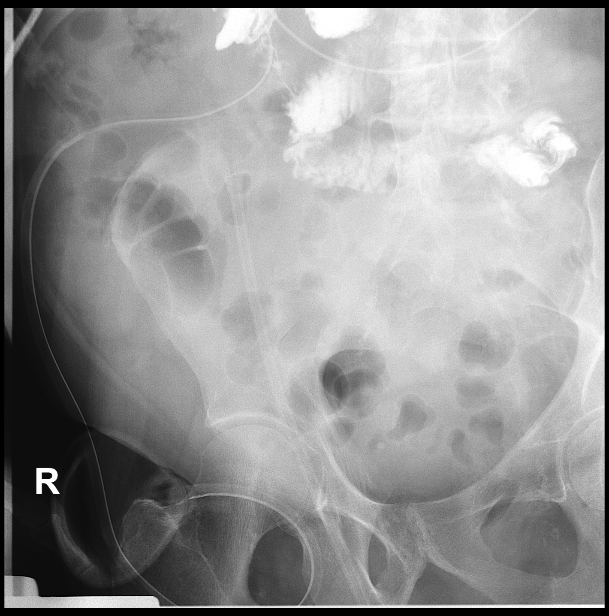

[14 of 16 positions shown; findings below may reference images not displayed]

FLUOROSCOPY TIME:  Radiation Exposure Index (as provided by the
fluoroscopic device): 124.4 mGy
FINDINGS: Nasogastric tube in the stomach. Patient drank water-soluble
contrast orally via straw.

Normal gastric folds.

Irregular outpouching of contrast along the duodenal bulb in the
region of the patient's known duodenal ulcer status post repair (for
example, series [DATE]). However, this contrast did not freely spill
into the peritoneal cavity and also did not drain along the
patient's surgical drain.

Contrast passed into the distal duodenum and proximal jejunum.
IMPRESSION: No evidence of leak.

Irregular outpouching of contrast along the duodenal bulb in the
region of the patient's known duodenal ulcer. No spill into the
peritoneal cavity.

## 2017-01-20 ENCOUNTER — Other Ambulatory Visit: Payer: Self-pay | Admitting: Cardiology

## 2017-01-20 ENCOUNTER — Other Ambulatory Visit: Payer: Self-pay | Admitting: Internal Medicine

## 2017-01-20 DIAGNOSIS — I5022 Chronic systolic (congestive) heart failure: Secondary | ICD-10-CM

## 2017-01-24 ENCOUNTER — Ambulatory Visit (INDEPENDENT_AMBULATORY_CARE_PROVIDER_SITE_OTHER): Payer: Medicare Other

## 2017-01-24 DIAGNOSIS — Z95810 Presence of automatic (implantable) cardiac defibrillator: Secondary | ICD-10-CM

## 2017-01-24 DIAGNOSIS — I5022 Chronic systolic (congestive) heart failure: Secondary | ICD-10-CM

## 2017-01-24 NOTE — Progress Notes (Signed)
EPIC Encounter for ICM Monitoring  [name] is a 71 y.o. male Date: 01/24/2017 Primary Care Physican: Woody Seller, MD Primary [place] Electrophysiologist: Faustino Congress Weight:unknown Bi-V Pacing: 66%       Heart Failure questions reviewed, pt asymptomatic   Thoracic impedance normal.  Recommendations: No changes. Patient is scheduled for AV node ablation tomorrow, 01/25/2017  Follow-up plan: ICM clinic phone appointment on 02/24/2017.  Copy of ICM check sent to primary cardiologist and device physician.   3 month ICM trend: 01/24/2017    1 Year ICM trend:      [name], RN 01/24/2017 4:31 PM

## 2017-01-25 ENCOUNTER — Ambulatory Visit: Payer: Medicare Other | Admitting: Cardiology

## 2017-01-25 ENCOUNTER — Encounter (HOSPITAL_COMMUNITY)
Admission: RE | Disposition: A | Discharge status: Home / Self Care | Payer: Self-pay | Source: Ambulatory Visit | Attending: Internal Medicine

## 2017-01-25 ENCOUNTER — Ambulatory Visit (HOSPITAL_COMMUNITY)
Admission: RE | Admit: 2017-01-25 | Discharge: 2017-01-25 | Disposition: A | Discharge status: Home / Self Care | Payer: Medicare Other | Source: Ambulatory Visit | Attending: Internal Medicine | Admitting: Internal Medicine

## 2017-01-25 DIAGNOSIS — N189 Chronic kidney disease, unspecified: Secondary | ICD-10-CM | POA: Insufficient documentation

## 2017-01-25 DIAGNOSIS — F329 Major depressive disorder, single episode, unspecified: Secondary | ICD-10-CM | POA: Diagnosis not present

## 2017-01-25 DIAGNOSIS — E785 Hyperlipidemia, unspecified: Secondary | ICD-10-CM | POA: Insufficient documentation

## 2017-01-25 DIAGNOSIS — Z7901 Long term (current) use of anticoagulants: Secondary | ICD-10-CM | POA: Diagnosis not present

## 2017-01-25 DIAGNOSIS — F419 Anxiety disorder, unspecified: Secondary | ICD-10-CM | POA: Insufficient documentation

## 2017-01-25 DIAGNOSIS — I481 Persistent atrial fibrillation: Secondary | ICD-10-CM | POA: Diagnosis not present

## 2017-01-25 DIAGNOSIS — I959 Hypotension, unspecified: Secondary | ICD-10-CM | POA: Insufficient documentation

## 2017-01-25 DIAGNOSIS — Z955 Presence of coronary angioplasty implant and graft: Secondary | ICD-10-CM | POA: Insufficient documentation

## 2017-01-25 DIAGNOSIS — I252 Old myocardial infarction: Secondary | ICD-10-CM | POA: Insufficient documentation

## 2017-01-25 DIAGNOSIS — N529 Male erectile dysfunction, unspecified: Secondary | ICD-10-CM | POA: Insufficient documentation

## 2017-01-25 DIAGNOSIS — I5022 Chronic systolic (congestive) heart failure: Secondary | ICD-10-CM | POA: Diagnosis not present

## 2017-01-25 DIAGNOSIS — I255 Ischemic cardiomyopathy: Secondary | ICD-10-CM | POA: Diagnosis not present

## 2017-01-25 DIAGNOSIS — Z95810 Presence of automatic (implantable) cardiac defibrillator: Secondary | ICD-10-CM | POA: Insufficient documentation

## 2017-01-25 DIAGNOSIS — I4891 Unspecified atrial fibrillation: Secondary | ICD-10-CM | POA: Diagnosis not present

## 2017-01-25 DIAGNOSIS — I2510 Atherosclerotic heart disease of native coronary artery without angina pectoris: Secondary | ICD-10-CM | POA: Diagnosis not present

## 2017-01-25 HISTORY — PX: AV NODE ABLATION: SHX1209

## 2017-01-25 HISTORY — PX: AV NODE ABLATION: EP1193

## 2017-01-25 LAB — PROTIME-INR
INR: 2.23
Prothrombin Time: 25.1 seconds — ABNORMAL HIGH (ref 11.4–15.2)

## 2017-01-25 SURGERY — AV NODE ABLATION

## 2017-01-25 MED ORDER — LOSARTAN POTASSIUM 25 MG PO TABS: 25.0000 mg | ORAL_TABLET | Freq: Every day | ORAL | 3 refills | Status: DC

## 2017-01-25 MED ORDER — MIDAZOLAM HCL 5 MG/5ML IJ SOLN
INTRAMUSCULAR | Status: AC
  Filled 2017-01-25: qty 5

## 2017-01-25 MED ORDER — SODIUM CHLORIDE 0.9% FLUSH: 3.0000 mL | Freq: Two times a day (BID) | INTRAVENOUS | Status: DC

## 2017-01-25 MED ORDER — FENTANYL CITRATE (PF) 100 MCG/2ML IJ SOLN
INTRAMUSCULAR | Status: AC
  Filled 2017-01-25: qty 2

## 2017-01-25 MED ORDER — HEPARIN (PORCINE) IN NACL 2-0.9 UNIT/ML-% IJ SOLN
INTRAMUSCULAR | Status: DC | PRN
  Administered 2017-01-25: 12:00:00

## 2017-01-25 MED ORDER — MIDAZOLAM HCL 5 MG/5ML IJ SOLN
INTRAMUSCULAR | Status: DC | PRN
  Administered 2017-01-25: 1 mg via INTRAVENOUS
  Administered 2017-01-25: 2 mg via INTRAVENOUS
  Administered 2017-01-25: 1 mg via INTRAVENOUS

## 2017-01-25 MED ORDER — SODIUM CHLORIDE 0.9% FLUSH: 3.0000 mL | INTRAVENOUS | Status: DC | PRN

## 2017-01-25 MED ORDER — BUPIVACAINE HCL (PF) 0.25 % IJ SOLN
INTRAMUSCULAR | Status: AC
  Filled 2017-01-25: qty 60

## 2017-01-25 MED ORDER — BUPIVACAINE HCL (PF) 0.25 % IJ SOLN
INTRAMUSCULAR | Status: DC | PRN
  Administered 2017-01-25: 20 mL

## 2017-01-25 MED ORDER — FENTANYL CITRATE (PF) 100 MCG/2ML IJ SOLN
INTRAMUSCULAR | Status: DC | PRN
  Administered 2017-01-25: 25 ug via INTRAVENOUS
  Administered 2017-01-25: 50 ug via INTRAVENOUS

## 2017-01-25 MED ORDER — SODIUM CHLORIDE 0.9 % IV SOLN: INTRAVENOUS | Status: AC

## 2017-01-25 MED ORDER — HEPARIN (PORCINE) IN NACL 2-0.9 UNIT/ML-% IJ SOLN
INTRAMUSCULAR | Status: AC
  Filled 2017-01-25: qty 500

## 2017-01-25 MED ORDER — SODIUM CHLORIDE 0.9 % IV SOLN: 250.0000 mL | INTRAVENOUS | Status: DC | PRN

## 2017-01-25 SURGICAL SUPPLY — 8 items
BAG SNAP BAND KOVER 36X36 (MISCELLANEOUS) ×2 IMPLANT
CATH BLAZER 5MM LG 8F 5086TMK2 (ABLATOR) ×2 IMPLANT
INTRODUCER SWARTZ SRO 8F (SHEATH) ×2 IMPLANT
PACK EP LATEX FREE (CUSTOM PROCEDURE TRAY) ×3
PACK EP LF (CUSTOM PROCEDURE TRAY) IMPLANT
PAD DEFIB LIFELINK (PAD) ×2 IMPLANT
SHEATH PINNACLE 8F 10CM (SHEATH) ×2 IMPLANT
SHIELD RADPAD SCOOP 12X17 (MISCELLANEOUS) ×2 IMPLANT

## 2017-01-25 NOTE — Progress Notes (Signed)
The patient has been seen by Dr. Caryl Comes and cleared for discharge once bed rest is completed. Procedure site is soft, non-tender, no hematoma Activity restrictions and wound care have been discussed with the patient F/u with device clinic has been arranged for 2 weeks for re-programming post AVnode ablation The patient reports he has an appointment for INR/coumadin check with his PMD 02/21/17, will keep this appointment.  We are stopping his Entresto, resuming his previous Losartan 25mg  daily medicine  Tommye Standard, PA-C

## 2017-01-25 NOTE — Discharge Instructions (Signed)
Angiogram, Care After These instructions give you information about caring for yourself after your procedure. Your doctor may also give you more specific instructions. Call your doctor if you have any problems or questions after your procedure. Follow these instructions at home:  Take medicines only as told by your doctor.  Follow your doctor's instructions about:  Care of the area where the tube was inserted.  Bandage (dressing) changes and removal.  You may shower 24-48 hours after the procedure or as told by your doctor.  Do not take baths, swim, or use a hot tub until your doctor approves.  Every day, check the area where the tube was inserted. Watch for:  Redness, swelling, or pain.  Fluid, blood, or pus.  Do not apply powder or lotion to the site.  Do not lift anything that is heavier than 10 lb (4.5 kg) for 5 days or as told by your doctor.  Ask your doctor when you can:  Return to work or school.  Do physical activities or play sports.  Have sex.  Do not drive or operate heavy machinery for 24 hours or as told by your doctor.  Have someone with you for the first 24 hours after the procedure.  Keep all follow-up visits as told by your doctor. This is important. Contact a health care provider if:  You have a fever.  You have chills.  You have more bleeding from the area where the tube was inserted. Hold pressure on the area.  You have redness, swelling, or pain in the area where the tube was inserted.  You have fluid or pus coming from the area. Get help right away if:  You have a lot of pain in the area where the tube was inserted.  The area where the tube was inserted is bleeding, and the bleeding does not stop after 30 minutes of holding steady pressure on the area.  The area near or just beyond the insertion site becomes pale, cool, tingly, or numb. This information is not intended to replace advice given to you by your health care provider. Make  sure you discuss any questions you have with your health care provider. Document Released: 03/03/2009 Document Revised: 05/12/2016 Document Reviewed: 05/08/2013 Elsevier Interactive Patient Education  2017 [place] .  Cardiac Ablation Cardiac ablation is a procedure to stop some heart tissue from causing problems. The heart has many electrical connections. Sometimes these connections cause the heart to beat very fast or irregularly. Removing some of the problem areas can improve heart rhythm or make it normal. Ablation is done for people who:  Have Wolff-Parkinson-White syndrome.  Have other fast heart rhythms (tachycardia).  Have taken medicines for an abnormal heart rhythm (arrhythmia) and the medicines had:  No success.  Side effects.  May have a type of heartbeat that could cause death. What happens before the procedure?  Follow instructions from your doctor about eating and drinking before the procedure.  Take your medicines as told by your doctor. Take them at regular times with water unless told differently by your doctor.  If you are taking diabetes medicine, ask your doctor how to take it. Ask if there are any special instructions you should follow. Your doctor may change how much insulin you take the day of the procedure. What happens during the procedure?  A special type of X-ray will be used. The X-ray helps your doctor see images of your heart during the procedure.  A small cut (incision) will be made  in your neck or groin.  An IV tube will be started before the procedure begins.  You will be given a numbing medicine (anesthetic) or a medicine to help you relax (sedative).  The skin on your neck or groin will be numbed.  A needle will be put into a large vein in your neck or groin.  A thin, flexible tube (catheter) will be put in to reach your heart.  A dye will be put in the tube. The dye will show up on X-rays. It will help your doctor see the area of the  heart that needs treatment.  When the heart tissue that is causing problems is found, the tip of the tube will send an electrical current to it. This will stop it from causing problems.  The tube will be taken out.  Pressure will be put on the area where the tube was. This will keep it from bleeding. A bandage will be placed over the area. What happens after the procedure?  You will be taken to a recovery area. Your blood pressure, heart rate, and breathing will be watched. The area where the tube was will also be watched for bleeding.  You will need to lie still for 4-6 hours. This keeps the area where the tube was from bleeding. This information is not intended to replace advice given to you by your health care provider. Make sure you discuss any questions you have with your health care provider. Document Released: 08/07/2013 Document Revised: 05/12/2016 Document Reviewed: 05/02/2013 Elsevier Interactive Patient Education  2017 [employer].

## 2017-01-25 NOTE — H&P (View-Only) (Signed)
Patient Care Team: Christain Sacramento, MD as PCP - General (Family Medicine)   HPI  [name] is a 71 y.o. male Seen in followup of afib for which he recently saw Dr Greggory Brandy and the was decision to not pursue P VI He also has ichemic cardiomyopathy and HFrEF with prior CRT-D from which he garnered little benefit. CXR demonstrates good lead location  He struggles with depression    In the past it has not been clear that   his symptoms of dyspnea are related to his atrial fibrillation. Clearly he has no palpitations. Is also not certain that he can recall previously when his atrial fibrillation is paroxysmal that his symptoms were worse while in AF. He is now permanent     In the past we decided to pursue augmented rate control with the introduction of low-dose beta blockers.  More recently the ability to control his heart rate has been a challenge. There was a recent effort intravenous Entresto. This has been complicated by low blood pressure. There has been concurrent elevation and heart rates.  In the past the absence of significant symptoms we also decided that pursuing ablation either of the AV node or of the pulmonary veins was not appropriate.  Hospitalized 3/17 for GI bleed 2/2 ulcers requring surgery for perforation  now back on warfain  Echocardiogram 1/17 EF 20-25%  He has been more euvolemic of late but is still struggled with heart failure and volume overload  Last metabolic profile was [id number]. Dig level 0.8 on 12/15  Past Medical History:  Diagnosis Date  . AICD (automatic cardioverter/defibrillator) present   . Anemia   . Anxiety   . CHF (congestive heart failure) ([place])   . Chronic renal insufficiency   . Coronary artery disease   . Depression   . Dyslipidemia   . Dysrhythmia    atrial fibrillation  . ED (erectile dysfunction)   . Ischemic cardiomyopathy    EF 23%  . MI (myocardial infarction) 2000   ANTERIOR, s/p PCI  . Other primary cardiomyopathies   .  Persistent atrial fibrillation (H[facility])    on coumadin  . Presence of permanent cardiac pacemaker   . Shortness of breath dyspnea    on exertion    Past Surgical History:  Procedure Laterality Date  . BIV ICD GENERTAOR CHANGE OUT N/A 09/16/2013   Procedure: BIV ICD GENERTAOR CHANGE OUT;  Surgeon: [name], MD;  Location: [facility] CATH LAB;  Service: Cardiovascular;  Laterality: N/A;  . BREAST LUMPECTOMY    . CARDIAC CATHETERIZATION  02/05/2008   MODERATE TO SEVERE LEFT VENTRICULAR  DYSFUNCTION WITH EF 25-30%  . CARDIAC DEFIBRILLATOR PLACEMENT  2009   MDT BI-VENTRICULAR ICD by Dr Caryl Comes  . COLONOSCOPY WITH PROPOFOL N/A 08/31/2016   Procedure: COLONOSCOPY WITH PROPOFOL;  Surgeon: Wilford Corner, MD;  Location[facility] ENDOSCOPY;  Service: Endoscopy;  Laterality: N/A;  . CORONARY ANGIOPLASTY  2000  . CORONARY STENT PLACEMENT     LAD  . CYSTOSCOPY N/A 01/25/2016   Procedure: CYSTOSCOPY;  Surgeon: Carolan Clines, MD;  Location: [facility] ORS;  Service: Urology;  Laterality: N/A;  . ESOPHAGOGASTRODUODENOSCOPY (EGD) WITH PROPOFOL N/A 08/31/2016   Procedure: ESOPHAGOGASTRODUODENOSCOPY (EGD) WITH PROPOFOL;  Surgeon: Wilford Corner, MD;  Location: [facility] ENDOSCOPY;  Service: Endoscopy;  Laterality: N/A;  . IMPLANTABLE CARDIOVERTER DEFIBRILLATOR GENERATOR CHANGE  2014  . INSERT / REPLACE / [place]     ICD  . LAPAROTOMY N/A 02/22/2016  Procedure: EXPLORATORY LAPAROTOMY WITH  PATCH OF DUODENAL ULCER;  Surgeon: Rolm Bookbinder, MD;  Location: [place];  Service: General;  Laterality: N/A;  . MULTIPLE TOOTH EXTRACTIONS    . TRANSTHORACIC ECHOCARDIOGRAM  12/2010   EF 30-35%  . TRANSURETHRAL RESECTION OF PROSTATE N/A 01/25/2016   Procedure: TRANSURETHRAL RESECTION OF THE PROSTATE (TURP);  Surgeon: Carolan Clines, MD;  Location: [facility] ORS;  Service: Urology;  Laterality: N/A;  . WRIST FRACTURE SURGERY      Current Outpatient Prescriptions  Medication Sig Dispense Refill  . digoxin (DIGOX) 0.125 MG tablet  Take 0.5 tablets (62.5 mcg total) by mouth daily. 45 tablet 2  . fenofibrate 160 MG tablet TAKE 1 TABLET BY MOUTH  DAILY 90 tablet 3  . ferrous sulfate 325 (65 FE) MG tablet Take 325 mg by mouth 2 (two) times daily.    . folic acid (FOLVITE) Q000111Q MCG tablet Take 400 mcg by mouth daily.    . furosemide (LASIX) 40 MG tablet Take 1 tablet each morning and 1/2 tablet each afternooon 135 tablet 1  . metoprolol succinate (TOPROL-XL) 25 MG 24 hr tablet Take 25 mg by mouth daily.    . Multiple Vitamins-Minerals (MULTIVITAMIN ADULTS PO) Take 1 tablet by mouth daily.    . pantoprazole (PROTONIX) 40 MG tablet Take 2 tablets (80 mg total) by mouth 2 (two) times daily.    . potassium chloride SA (K-DUR,KLOR-CON) 20 MEQ tablet Take 1 tablet (20 mEq total) by mouth daily. 90 tablet 3  . pravastatin (PRAVACHOL) 40 MG tablet TAKE 1 TABLET BY MOUTH  DAILY 90 tablet 3  . sacubitril-valsartan (ENTRESTO) 24-26 MG Take 1 tablet by mouth 2 (two) times daily. 60 tablet 2  . sertraline (ZOLOFT) 100 MG tablet Take 100 mg by mouth daily.      . vitamin C (ASCORBIC ACID) 500 MG tablet Take 500 mg by mouth 2 (two) times daily.    Marland Kitchen warfarin (COUMADIN) 5 MG tablet Take 0.5 tablets (2.5 mg total) by mouth daily at 6 PM. 30 tablet 0   No current facility-administered medications for this visit.     Allergies  Allergen Reactions  . Keflex [Cephalexin] Shortness Of Breath and Other (See Comments)    dizziness    Review of Systems negative except from HPI and PMH  Physical Exam BP (!) 90/50   Pulse 98   Ht 5\' 10"  (1.778 m)   Wt 208 lb 12.8 oz (94.7 kg)   SpO2 98%   BMI 29.96 kg/m  Well developed and nourished in no acute distress HENT normal Neck supple with JVP-8  k and full without bruits Clear Irregularly irregular rate and rhythm with controlled ventricular response, no murmurs or gallops Abd-soft with active BS without hepatomegaly abd sscar No Clubbing cyanosis 3+ edema Skin-warm and dry A & Oriented   Grossly normal sensory and motor function  ECG Atrial fibrillation with intermittent biventricular pacing   Upright QRS V1  Assessment and  Plan  Atrial fibrillation increasingly rapid rate  Congestive heart failure-chronic-systolic  Ischemic cardiomyopathy  Hypotension  CRT-D.  The patient's device was interrogated.  The information was reviewed. No changes were made in the programming.    Depression  Irritability  Had a very lengthy discussion with pt and his wife.  I will discuss this also with Dr. Martinique. The interrogation of his device demonstrates a significant increase in his average heart rate from the 70-80 range to now about 110. This may be concurred with the  introduction of Entresto hypotension and perhaps reflex sympathetic tone enhancing AV nodal conduction. Augmented rate control is not possible. With his average rates where they are I think we exceed the scope of the RACE-2 TRIAL     I will discuss this with Dr. Martinique but I think the options would be discontinuing Entresto and trying low-dose ARB so as to be able to use a beta blocker and see if we can control rates as previously, AV junction ablation, amiodarone for rate control. There are recent data demonstrating improved outcomes hospitalizations in mortality in patients who undergo AV junction ablation in conjunction with biventricular pacing. There are some discrepancy on these data.  He is also scheduled for open laparotomy to address an incisional hernia.  Certainly having his HF most compensated minimizes the risk  Control of HR would help this  More than 50% of 45 min was spent in counseling related to the above

## 2017-01-25 NOTE — Interval H&P Note (Signed)
History and Physical Interval Note:  01/25/2017 11:11 AM  [name]  has presented today for surgery, with the diagnosis of av node dysfunction  The various methods of treatment have been discussed with the patient and family. After consideration of risks, benefits and other options for treatment, the patient has consented to  Procedure(s): AV Node Ablation (N/A) as a surgical intervention .  The patient's history has been reviewed, patient examined, no change in status, stable for surgery.  I have reviewed the patient's chart and labs.  Questions were answered to the patient's satisfaction.     Virl Axe

## 2017-01-25 NOTE — Progress Notes (Signed)
Site area: right groin Site Prior to Removal:  Level 0 Pressure Applied For:  15 minutes Manual:   yes Patient Status During Pull:  stable Post Pull Site:  Level  0 Post Pull Instructions Given:  yes Post Pull Pulses Present: palpable Dressing Applied:  Gauze and tegaderm Bedrest begins @  [id number] Comments:

## 2017-01-26 ENCOUNTER — Encounter (HOSPITAL_COMMUNITY): Payer: Self-pay | Admitting: Internal Medicine

## 2017-01-27 ENCOUNTER — Other Ambulatory Visit: Payer: Self-pay | Admitting: *Deleted

## 2017-01-27 MED ORDER — DIGOXIN 125 MCG PO TABS: 62.5000 ug | ORAL_TABLET | Freq: Every day | ORAL | 0 refills | Status: DC

## 2017-02-02 ENCOUNTER — Ambulatory Visit: Payer: Medicare Other | Admitting: *Deleted

## 2017-02-02 NOTE — Progress Notes (Signed)
Remote ICD transmission.   

## 2017-02-08 ENCOUNTER — Ambulatory Visit (INDEPENDENT_AMBULATORY_CARE_PROVIDER_SITE_OTHER): Payer: Medicare Other | Admitting: *Deleted

## 2017-02-08 DIAGNOSIS — I5022 Chronic systolic (congestive) heart failure: Secondary | ICD-10-CM | POA: Diagnosis not present

## 2017-02-08 DIAGNOSIS — I255 Ischemic cardiomyopathy: Secondary | ICD-10-CM

## 2017-02-08 LAB — CUP PACEART INCLINIC DEVICE CHECK
Brady Statistic AP VP Percent: 0 %
Brady Statistic AP VS Percent: 0 %
Brady Statistic AS VP Percent: 96.98 %
Brady Statistic AS VS Percent: 3.02 %
HIGH POWER IMPEDANCE MEASURED VALUE: 61 Ohm
HighPow Impedance: 84 Ohm
Implantable Lead Implant Date: 20090226
Implantable Lead Location: 753860
Implantable Lead Model: 4194
Implantable Lead Model: 5076
Implantable Lead Model: 6947
Lead Channel Impedance Value: 323 Ohm
Lead Channel Impedance Value: 380 Ohm
Lead Channel Impedance Value: 551 Ohm
Lead Channel Impedance Value: 608 Ohm
Lead Channel Impedance Value: 760 Ohm
Lead Channel Pacing Threshold Amplitude: 0.75 V
Lead Channel Pacing Threshold Pulse Width: 0.6 ms
Lead Channel Sensing Intrinsic Amplitude: 4.125 mV
Lead Channel Setting Sensing Sensitivity: 0.3 mV
MDC IDC LEAD IMPLANT DT: 20090226
MDC IDC LEAD IMPLANT DT: 20090226
MDC IDC LEAD LOCATION: 753858
MDC IDC LEAD LOCATION: 753859
MDC IDC MSMT BATTERY REMAINING LONGEVITY: 33 mo
MDC IDC MSMT BATTERY VOLTAGE: 2.95 V
MDC IDC MSMT LEADCHNL RA SENSING INTR AMPL: 4.5 mV
MDC IDC MSMT LEADCHNL RV IMPEDANCE VALUE: 323 Ohm
MDC IDC MSMT LEADCHNL RV PACING THRESHOLD AMPLITUDE: 1 V
MDC IDC MSMT LEADCHNL RV PACING THRESHOLD PULSEWIDTH: 0.6 ms
MDC IDC PG IMPLANT DT: 20140929
MDC IDC SESS DTM: 20180221135633
MDC IDC SET LEADCHNL LV PACING AMPLITUDE: 1.75 V
MDC IDC SET LEADCHNL LV PACING PULSEWIDTH: 0.6 ms
MDC IDC SET LEADCHNL RV PACING AMPLITUDE: 2.5 V
MDC IDC SET LEADCHNL RV PACING PULSEWIDTH: 0.6 ms
MDC IDC STAT BRADY RA PERCENT PACED: 0 %
MDC IDC STAT BRADY RV PERCENT PACED: 96.45 %

## 2017-02-08 NOTE — Progress Notes (Signed)
CRT-D device check in office. Thresholds and sensing consistent with previous device measurements. Lead impedance trends stable over time. No ventricular arrhythmia episodes recorded. Patient bi-ventricularly pacing 99.7% of the time with 3.3% as VSRp. Device programmed with appropriate safety margins. Heart failure diagnostics reviewed and trends are stable for patient. Base rate lowered to 80bpm, RR turned on. Audible alerts demonstrated for patient. No changes made this session. Estimated longevity 2.7 years. Patient will follow up in 1 month to decrease rate to 70bpm.

## 2017-02-09 ENCOUNTER — Ambulatory Visit: Payer: Medicare Other

## 2017-02-18 NOTE — Progress Notes (Signed)
[name] Date of Birth: 1946/12/10 Medical Record [id number]  History of Present Illness: Mr. Laramore is seen today for followup of atrial fibrillation and CAD.  He has a history of remote anterior myocardial infarction with an ischemic cardiomyopathy and ejection fraction of 23%. He is status post CRT D. He had a generator replacement in September 2014. He also has a history of atrial fibrillation. He is intolerant of amiodarone. His QT interval is too long for Tikosyn. He previously had attempted to increase his carvedilol dose but he developed significant hypotension.  He was admitted from 3/2-3/15/17 with a perforated duodenal ulcer with GI bleed. He required surgery with Phillip Heal patch. He was transfused. He actually did quite well from a cardiac standpoint. Some of his cardiac meds were held due to hypotension. He also had bilateral distal DVTs. He was discharged to [facility] and discharged after about 8 days.  In September 2017 he had upper and lower EGD. He had some esophagitis and some nodular irregularity of the gastric mucosa. The duodenal patch was healing. There was a duodenal polyp. On lower EGD he had hemorrhoids, diverticuli, and a 5 cm polyp. Biopsies negative for malignancy. Later CT of abdomen as noted below. Significant ventral hernia. Seeing Dr. Dalbert Batman with potential plans for hernia repair.  In October2017 his Optivol indicated increased fluid retention and lasix was increased to 40 mg bid. Optivol level improved. Started on [place] but limited due to hypotension and had to be discontinued and losartan resumed. Later noted to have persistent Afib with elevated HR. Seen by Dr. Caryl Comes who noted "There are recent data demonstrating improved outcomes hospitalizations in mortality in patients who undergo AV junction ablation in conjunction with biventricular pacing." On 01/25/17 he underwent AV node ablation. When seen in follow up he had BiV pacing 99.7% of the time. Rate set at  80 bpm with plans to reduce rate to 70 bpm after one month.   On follow up today he states he notes improvement since his ablation. He can walk more without getting SOB. Energy level is better. No edema. Weight is stable. Wife notes he looks better and his hands don't turn blue anymore. No longer getting hypotensive.     Allergies as of 02/21/2017      Reactions   Keflex [cephalexin] Shortness Of Breath, Other (See Comments)   dizziness      Medication List       Accurate as of 02/21/17  9:35 AM. Always use your most recent med list.          digoxin 0.125 MG tablet Commonly known as:  DIGOX Take 0.5 tablets (62.5 mcg total) by mouth daily.   fenofibrate 160 MG tablet TAKE 1 TABLET BY MOUTH  DAILY   ferrous sulfate 325 (65 FE) MG tablet Take 325 mg by mouth 2 (two) times daily.   folic acid Q000111Q MCG tablet Commonly known as:  FOLVITE Take 400 mcg by mouth daily.   furosemide 40 MG tablet Commonly known as:  LASIX TAKE 1 TABLET BY MOUTH TWO  TIMES DAILY   losartan 25 MG tablet Commonly known as:  COZAAR Take 1 tablet (25 mg total) by mouth daily.   metoprolol succinate 25 MG 24 hr tablet Commonly known as:  TOPROL-XL Take 25 mg by mouth daily at 12 noon.   MULTIVITAMIN ADULTS PO Take 1 tablet by mouth daily.   nitroGLYCERIN 0.4 MG SL tablet Commonly known as:  NITROSTAT Place 1 tablet (0.4 mg  total) under the tongue every 5 (five) minutes as needed for chest pain.   pantoprazole 40 MG tablet Commonly known as:  PROTONIX Take 2 tablets daily   potassium chloride SA 20 MEQ tablet Commonly known as:  K-DUR,KLOR-CON Take 1 tablet (20 mEq total) by mouth daily.   pravastatin 40 MG tablet Commonly known as:  PRAVACHOL TAKE 1 TABLET BY MOUTH  DAILY   sertraline 100 MG tablet Commonly known as:  ZOLOFT Take 100 mg by mouth daily.   vitamin C 500 MG tablet Commonly known as:  ASCORBIC ACID Take 500 mg by mouth 2 (two) times daily.   warfarin 5 MG  tablet Commonly known as:  COUMADIN Take 0.5 tablets (2.5 mg total) by mouth daily at 6 PM.        Allergies  Allergen Reactions  . Keflex [Cephalexin] Shortness Of Breath and Other (See Comments)    dizziness    Past Medical History:  Diagnosis Date  . AICD (automatic cardioverter/defibrillator) present   . Anemia   . Anxiety   . CHF (congestive heart failure) ([facility])   . Chronic renal insufficiency   . Coronary artery disease   . Depression   . Dyslipidemia   . Dysrhythmia    atrial fibrillation  . ED (erectile dysfunction)   . Ischemic cardiomyopathy    EF 23%  . MI (myocardial infarction) 2000   ANTERIOR, s/p PCI  . Other primary cardiomyopathies   . Persistent atrial fibrillation (HCC)    on coumadin  . Presence of permanent cardiac pacemaker   . Shortness of breath dyspnea    on exertion    Past Surgical History:  Procedure Laterality Date  . AV NODE ABLATION N/A 01/25/2017   Procedure: AV Node Ablation;  Surgeon: [name], MD;  Location: [facility] CV LAB;  Service: Cardiovascular;  Laterality: N/A;  . BIV ICD GENERTAOR CHANGE OUT N/A 09/16/2013   Procedure: BIV ICD GENERTAOR CHANGE OUT;  Surgeon: [name], MD;  Location: [facility] CATH LAB;  Service: Cardiovascular;  Laterality: N/A;  . BREAST LUMPECTOMY    . CARDIAC CATHETERIZATION  02/05/2008   MODERATE TO SEVERE LEFT VENTRICULAR  DYSFUNCTION WITH EF 25-30%  . CARDIAC DEFIBRILLATOR PLACEMENT  2009   MDT BI-VENTRICULAR ICD by Dr Caryl Comes  . COLONOSCOPY WITH PROPOFOL N/A 08/31/2016   Procedure: COLONOSCOPY WITH PROPOFOL;  Surgeon: Wilford Corner, MD;  Location: [facility] ENDOSCOPY;  Service: Endoscopy;  Laterality: N/A;  . CORONARY ANGIOPLASTY  2000  . CORONARY STENT PLACEMENT     LAD  . CYSTOSCOPY N/A 01/25/2016   Procedure: CYSTOSCOPY;  Surgeon: Carolan Clines, MD;  Location: [facility] ORS;  Service: Urology;  Laterality: N/A;  . ESOPHAGOGASTRODUODENOSCOPY (EGD) WITH PROPOFOL N/A 08/31/2016   Procedure:  ESOPHAGOGASTRODUODENOSCOPY (EGD) WITH PROPOFOL;  Surgeon: Wilford Corner, MD;  Location: [facility] ENDOSCOPY;  Service: Endoscopy;  Laterality: N/A;  . IMPLANTABLE CARDIOVERTER DEFIBRILLATOR GENERATOR CHANGE  2014  . INSERT / REPLACE / [place]     ICD  . LAPAROTOMY N/A 02/22/2016   Procedure: EXPLORATORY LAPAROTOMY WITH  PATCH OF DUODENAL ULCER;  Surgeon: Rolm Bookbinder, MD;  Location: [facility];  Service: General;  Laterality: N/A;  . MULTIPLE TOOTH EXTRACTIONS    . TRANSTHORACIC ECHOCARDIOGRAM  12/2010   EF 30-35%  . TRANSURETHRAL RESECTION OF PROSTATE N/A 01/25/2016   Procedure: TRANSURETHRAL RESECTION OF THE PROSTATE (TURP);  Surgeon: Carolan Clines, MD;  Location: [facility] ORS;  Service: Urology;  Laterality: N/A;  . WRIST FRACTURE SURGERY  History  Smoking Status  . Former Smoker  . Types: Cigarettes  . Quit date: 12/23/1998  Smokeless Tobacco  . Never Used    History  Alcohol Use No    Family History  Problem Relation Age of Onset  . Breast cancer Mother   . Depression Mother   . Heart attack Father   . Breast cancer Sister     Review of Systems: As noted in history of present illness. All other systems were reviewed and are negative.  Physical Exam: BP 104/60   Pulse 82   Ht 5\' 10"  (1.778 m)   Wt 210 lb 8 oz (95.5 kg)   BMI 30.20 kg/m  He is a pleasant white male in no acute distress. HEENT: Unremarkable No JVD or bruits. Lungs: Clear Cardiovascular: Regular rate and rhythm, normal S1 and S2. Grade 2/6 systolic murmur at apex. Abdomen: Soft, nontender. No masses or bruits. Large incisional hernia. Extremities: no edema. Large venous varicosities. Support hose in place.  Skin: Warm and dry Neuro: Alert oriented x3. Cranial nerves II through XII are intact.  LABORATORY DATA:  Lab Results  Component Value Date   WBC 6.4 01/18/2017   HGB 9.6 (L) 05/05/2016   HCT 42.5 01/18/2017   PLT 214 01/18/2017   GLUCOSE 96 01/18/2017   CHOL 112 02/27/2016   TRIG  53 02/29/2016   HDL 32 (L) 02/27/2016   LDLDIRECT 137.6 01/06/2012   LDLCALC 66 02/27/2016   ALT 18 03/07/2016   AST 23 03/07/2016   NA 144 01/18/2017   K 4.0 01/18/2017   CL 100 01/18/2017   CREATININE 1.78 (H) 01/18/2017   BUN 31 (H) 01/18/2017   CO2 24 01/18/2017   TSH 3.082 02/18/2016   PSA 3.19 01/19/2016   INR 2.23 01/25/2017   CT ABDOMEN AND PELVIS WITH CONTRAST  TECHNIQUE: Multidetector CT imaging of the abdomen and pelvis was performed using the standard protocol following bolus administration of intravenous contrast.  CONTRAST:  1mL ISOVUE-300 IOPAMIDOL (ISOVUE-300) INJECTION 61%  COMPARISON:  CT scan abdomen and pelvis 02/22/2016  FINDINGS: Lower chest: 6 mm nodular opacity in the posterior periphery of the left lower lobe (image 8 series 5). Atelectasis in the lung base obscures this region on the prior study. Incompletely imaged cardiac rhythm maintenance device with both internal and external leads. Left ventricular dilatation with fibro fatty remodeling of the septal and apical left ventricular wall and slight aneurysmal outpouching consistent with a remote prior myocardial infarction and resultant scarring. No pericardial effusion. Unremarkable distal thoracic esophagus.  Hepatobiliary: Unremarkable hepatic contour and morphology. The portal veins are patent. No discrete hepatic lesion. Peripherally calcified gallstone noted in the gallbladder neck consistent with cholelithiasis. No gallbladder wall thickening or pericholecystic fluid.  Pancreas: Unremarkable. No pancreatic ductal dilatation or surrounding inflammatory changes.  Spleen: Normal in size without focal abnormality.  Adrenals/Urinary Tract: Normal adrenal glands. Multiple circumscribed fluid attenuation renal lesions bilaterally consistent with simple cysts of varying size. No focal enhancing renal lesion, hydronephrosis or nephrolithiasis.  Stomach/Bowel: Colonic  diverticular disease without CT evidence of active inflammation. No focal bowel wall thickening or evidence of obstruction. Normal appendix in the right lower quadrant. Partial herniation of the anterior wall of the transverse colon. Several loops of small bowel are closely apposed to the undersurface of the anterior abdominal wall in the region of the prior midline incision suggesting adhesive disease.  Vascular/Lymphatic: Atherosclerotic calcifications throughout the abdominal aorta. No evidence of aneurysm or significant stenosis. No focal venous  abnormality.  Reproductive: Prostate is unremarkable.  Other: Diastases recti. Omental fat containing midline ventral hernia superior to the emboli kiss. Additionally, there is a small portion of the anterior wall of the colon at the superior aspect of the herniation defect.  Musculoskeletal: No acute or significant osseous findings.  IMPRESSION: 1. Midline ventral hernia superior to the umbilicus containing predominantly omental fat and also a small portion of the anterior wall of the transverse colon. No evidence of associated colonic wall thickening or obstruction. 2. Inferior to the ventral hernia is a region of diastases recti with multiple loops of small bowel closely apposed to the peritoneal surface suggesting the presence of adhesions. 3. Solitary 6 mm pulmonary nodule identified in the left lower lobe. This region was obscured by atelectasis on the prior CT scan. Non-contrast chest CT at 6-12 months is recommended. If the nodule is stable at time of repeat CT, then future CT at 18-24 months (from today's scan) is considered optional for low-risk patients, but is recommended for high-risk patients. This recommendation follows the consensus statement: Guidelines for Management of Incidental Pulmonary Nodules Detected on CT Images: From the Fleischner Society 2017; Radiology 2017; 284:228-243. 4. Cardiomegaly with left  ventricular scarring and small apical aneurysm formation consistent with prior myocardial infarction. 5. 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 6. Cholelithiasis. 7.  Aortic Atherosclerosis (ICD10-170.0) 8. Bilateral renal cysts.   Electronically Signed   By: Jacqulynn Cadet M.D.   On: 11/07/2016   Assessment / Plan: 1. Atrial fibrillation. Now s/p AV node ablation with improved rate control. Will continue with metoprolol.  On coumadin and INR checked by primary care. Rate adjustment later this month in pacer clinic.   2. Coronary disease with remote anterior myocardial infarction. He is asymptomatic.  3. Chronic systolic congestive heart failure. He is well compensated today. Currently on metoprolol, lasix, losartan, and digoxin.   Ejection fraction is 20-25% in January 2017. Will repeat Echo in 2-3 months post AV node ablation and optimized medical therapy.   Continue sodium restriction.   4. S/p perforated duodenal ulcer and bleeding. S/p surgery with duodenal patch. Now with significant ventral hernia. Surgery recommended and he will be seeing Dr. Dalbert Batman later this month.   I think he would be a suitable surgical candidate from my standpoint now.   5.  S/p CRT-D. Followed in device clinic.   6. Bilateral DVT- right soleal, left posterial tibial and peroneal veins. On coumadin. No symptoms or physical findings now.  Medication reviewed and updated list as noted.   We will follow up in 3 months- consider repeat Echo at that time. When surgery scheduled would need bridging Lovenox when he comes off Coumadin.  Peter Martinique MD, [facility]  02/21/2017

## 2017-02-20 ENCOUNTER — Telehealth: Payer: Self-pay | Admitting: Cardiology

## 2017-02-20 MED ORDER — NITROGLYCERIN 0.4 MG SL SUBL: 0.4000 mg | SUBLINGUAL_TABLET | SUBLINGUAL | 3 refills | Status: DC | PRN

## 2017-02-20 MED ORDER — DIGOXIN 125 MCG PO TABS: 62.5000 ug | ORAL_TABLET | Freq: Every day | ORAL | 3 refills | Status: DC

## 2017-02-20 MED ORDER — DIGOXIN 125 MCG PO TABS: 62.5000 ug | ORAL_TABLET | Freq: Every day | ORAL | 0 refills | Status: DC

## 2017-02-20 NOTE — Telephone Encounter (Signed)
New message      Pt has an appt tomorrow with Dr Martinique.  He want to know if he can get a written presc for nitrostat when he comes in and if we have any samples of digoxin 0.125. You do not have to call him back unless there is a problem.  He will get these items tomorrow at his appt.

## 2017-02-20 NOTE — Telephone Encounter (Signed)
Refills sent as requested

## 2017-02-21 ENCOUNTER — Ambulatory Visit (INDEPENDENT_AMBULATORY_CARE_PROVIDER_SITE_OTHER): Payer: Medicare Other | Admitting: Cardiology

## 2017-02-21 ENCOUNTER — Encounter: Payer: Self-pay | Admitting: Cardiology

## 2017-02-21 VITALS — BP 104/60 | HR 82 | Ht 70.0 in | Wt 210.5 lb

## 2017-02-21 DIAGNOSIS — I5022 Chronic systolic (congestive) heart failure: Secondary | ICD-10-CM | POA: Diagnosis not present

## 2017-02-21 DIAGNOSIS — I255 Ischemic cardiomyopathy: Secondary | ICD-10-CM | POA: Diagnosis not present

## 2017-02-21 DIAGNOSIS — I480 Paroxysmal atrial fibrillation: Secondary | ICD-10-CM | POA: Diagnosis not present

## 2017-02-21 DIAGNOSIS — Z95810 Presence of automatic (implantable) cardiac defibrillator: Secondary | ICD-10-CM

## 2017-02-21 DIAGNOSIS — I10 Essential (primary) hypertension: Secondary | ICD-10-CM | POA: Diagnosis not present

## 2017-02-21 NOTE — Patient Instructions (Signed)
Continue your current therapy   I will see you in 3 months. 

## 2017-02-22 ENCOUNTER — Telehealth: Payer: Self-pay | Admitting: Cardiology

## 2017-02-22 NOTE — Telephone Encounter (Signed)
New message      Request for surgical clearance:  1. What type of surgery is being performed?  Hernia repair  2. When is this surgery scheduled? Pending clearance  Are there any medications that need to be held prior to surgery and how long?  Hold coumadin 3. Name of physician performing surgery?  Dr Dalbert Batman  What is your office phone and fax number? Ok to put note in epic.  A clearance form has already been faxed to [facility]

## 2017-02-22 NOTE — Telephone Encounter (Signed)
Spoke to patient Dr.Jordan cleared you to have upcoming surgery.He advised will need to be bridged to Lovenox.Advised I will call PCP Jenny Reichmann PA Friday 02/24/17 to see if she wants to bridge since they manage your coumadin.

## 2017-02-24 ENCOUNTER — Ambulatory Visit (INDEPENDENT_AMBULATORY_CARE_PROVIDER_SITE_OTHER): Payer: Medicare Other

## 2017-02-24 DIAGNOSIS — I5022 Chronic systolic (congestive) heart failure: Secondary | ICD-10-CM | POA: Diagnosis not present

## 2017-02-24 DIAGNOSIS — Z95810 Presence of automatic (implantable) cardiac defibrillator: Secondary | ICD-10-CM | POA: Diagnosis not present

## 2017-02-24 NOTE — Progress Notes (Signed)
EPIC Encounter for ICM Monitoring  [name] is a 71 y.o. male Date: 02/24/2017 Primary Care Physican: Woody Seller, MD Primary [place] Electrophysiologist: Faustino Congress Weight:unknown Bi-V Pacing: 96.4%              Heart Failure questions reviewed, pt asymptomatic.   Thoracic impedance normal.  Prescribed and confirmed dosage: Furosemide 40 mg 1 tablet bid.  Potassium 20 mEq 1 tablet daily  Labs:  12/21/2016 Creatinine 1.63, BUN 30, Potassium 4.0, Sodium 145 12/07/2016 Creatinine 1.71, BUN 39, Potassium 4.1, Sodium 141 11/25/2016 Creatinine 1.76, BUN 29, Potassium 4.2, Sodium 144 08/11/2016 Creatinine 1.59, BUN 34, Potassium 3.9, Sodium 141 05/12/2016 Creatinine 1.27, BUN 22, Potassium 3.5, Sodium 144 05/05/2016 Creatinine 1.22, BUN 17, Potassium 3.2, Sodium 143 03/14/2016 Creatinine 1.39, BUN 25, Potassium 3.4, Sodium 145  Recommendations: No changes. Reminded to limit dietary salt intake to 2000 mg/day and fluid intake to < 2 liters/day. Encouraged to call for fluid symptoms.  Follow-up plan: ICM clinic phone appointment on 03/27/2017.  Office defib check 03/09/2017  Copy of ICM check sent to device physician.   3 month ICM trend: 02/24/2017     1 Year ICM trend:      [name], RN 02/24/2017 7:56 AM

## 2017-02-24 NOTE — Telephone Encounter (Signed)
Spoke to [employer] office was advised to fax a request for [place] Dr.Jordan wants patient to be bridged from coumadin to lovenox before upcoming surgery.Message faxed to [name] PA at fax # [phone].

## 2017-02-28 ENCOUNTER — Other Ambulatory Visit: Payer: Self-pay

## 2017-02-28 MED ORDER — PANTOPRAZOLE SODIUM 40 MG PO TBEC: DELAYED_RELEASE_TABLET | ORAL | 3 refills | Status: DC

## 2017-03-01 ENCOUNTER — Telehealth: Payer: Self-pay | Admitting: Cardiology

## 2017-03-01 NOTE — Telephone Encounter (Signed)
Returned call to [place]-advised that they do not do Coumadin-Lovenox bridging and that Dr. Martinique would have to order this if this is what he recommends as they have not seen patient in a while and did not receive a surgical clearance.  Advised I would route to MD for recommendations.

## 2017-03-01 NOTE — Telephone Encounter (Signed)
New message   [name] from [facility] is calling about a note they received from Dr. Martinique. Note states pt needs Lovenox bridge when surgery is scheduled. She states they don't feel comfortable doing this, all they do it PTINR and would like for Dr. Martinique to do this.

## 2017-03-01 NOTE — Telephone Encounter (Signed)
Have our pharmacy take care of bridging please.  Amani Nodarse Martinique MD, [facility]

## 2017-03-02 NOTE — Telephone Encounter (Signed)
Message sent to coumadin clinic. 

## 2017-03-02 NOTE — Telephone Encounter (Signed)
Talked to [name] this morning. He is to call coumadin clinic ([facility]) at [phone] a week from today with exact day of procedure.  Need an appointment in clinic for bridge plan and lovenox teaching.

## 2017-03-09 ENCOUNTER — Ambulatory Visit (INDEPENDENT_AMBULATORY_CARE_PROVIDER_SITE_OTHER): Payer: Medicare Other | Admitting: *Deleted

## 2017-03-09 DIAGNOSIS — I5022 Chronic systolic (congestive) heart failure: Secondary | ICD-10-CM

## 2017-03-09 DIAGNOSIS — Z95810 Presence of automatic (implantable) cardiac defibrillator: Secondary | ICD-10-CM

## 2017-03-09 LAB — CUP PACEART INCLINIC DEVICE CHECK
Battery Remaining Longevity: 33 mo
Battery Voltage: 2.95 V
Brady Statistic AP VS Percent: 0 %
Brady Statistic RA Percent Paced: 0 %
Brady Statistic RV Percent Paced: 96.75 %
Date Time Interrogation Session: 20180322091401
HighPow Impedance: 50 Ohm
HighPow Impedance: 60 Ohm
Implantable Lead Implant Date: 20090226
Implantable Lead Location: 753858
Implantable Lead Location: 753859
Implantable Lead Model: 4194
Implantable Pulse Generator Implant Date: 20140929
Lead Channel Impedance Value: 323 Ohm
Lead Channel Impedance Value: 323 Ohm
Lead Channel Impedance Value: 494 Ohm
Lead Channel Sensing Intrinsic Amplitude: 4.125 mV
Lead Channel Sensing Intrinsic Amplitude: 8.875 mV
Lead Channel Setting Pacing Amplitude: 1.75 V
Lead Channel Setting Pacing Amplitude: 2.5 V
Lead Channel Setting Pacing Pulse Width: 0.6 ms
Lead Channel Setting Pacing Pulse Width: 0.6 ms
MDC IDC LEAD IMPLANT DT: 20090226
MDC IDC LEAD IMPLANT DT: 20090226
MDC IDC LEAD LOCATION: 753860
MDC IDC MSMT LEADCHNL LV IMPEDANCE VALUE: 646 Ohm
MDC IDC MSMT LEADCHNL LV IMPEDANCE VALUE: 817 Ohm
MDC IDC MSMT LEADCHNL LV PACING THRESHOLD AMPLITUDE: 0.625 V
MDC IDC MSMT LEADCHNL LV PACING THRESHOLD PULSEWIDTH: 0.6 ms
MDC IDC MSMT LEADCHNL RA SENSING INTR AMPL: 4.125 mV
MDC IDC MSMT LEADCHNL RV IMPEDANCE VALUE: 342 Ohm
MDC IDC MSMT LEADCHNL RV SENSING INTR AMPL: 16.875 mV
MDC IDC SET LEADCHNL RV SENSING SENSITIVITY: 0.3 mV
MDC IDC STAT BRADY AP VP PERCENT: 0 %
MDC IDC STAT BRADY AS VP PERCENT: 97.08 %
MDC IDC STAT BRADY AS VS PERCENT: 2.92 %

## 2017-03-09 NOTE — Progress Notes (Signed)
CRTD check in clinic to lower base rate to 70bpm from 80bpm s/p AVN ablation 01/25/17. ROV with SK 05/03/17.

## 2017-03-10 ENCOUNTER — Other Ambulatory Visit: Payer: Self-pay | Admitting: General Surgery

## 2017-03-13 NOTE — Telephone Encounter (Signed)
LMOM ; patient to call back with date of procedure. Need to set appointment for bridge plan.

## 2017-03-13 NOTE — Telephone Encounter (Signed)
Call [facility]  Left message foe Lanette Hampshire - have patient call coumadin clinic at [phone] to set appointment for bridge therapy

## 2017-03-14 NOTE — Telephone Encounter (Signed)
LMOM; patient to call back with exact day of procedure and to make appointment to discuss bridge plan.

## 2017-03-15 NOTE — Telephone Encounter (Signed)
No surgical procedure schedule yet.   Patient to call back once date available.

## 2017-03-27 ENCOUNTER — Telehealth: Payer: Self-pay

## 2017-03-27 NOTE — Telephone Encounter (Signed)
Attempted ICM call to patient and left message to send remote transmission.

## 2017-03-28 NOTE — Progress Notes (Signed)
No ICM remote transmission received for 03/27/2017 and next ICM transmission scheduled for 04/13/2017.

## 2017-04-03 ENCOUNTER — Ambulatory Visit (INDEPENDENT_AMBULATORY_CARE_PROVIDER_SITE_OTHER): Payer: Medicare Other | Admitting: Pharmacist

## 2017-04-03 DIAGNOSIS — I4891 Unspecified atrial fibrillation: Secondary | ICD-10-CM

## 2017-04-03 LAB — POCT INR: INR: 1.8

## 2017-04-03 MED ORDER — ENOXAPARIN SODIUM 100 MG/ML ~~LOC~~ SOLN: 100.0000 mg | Freq: Two times a day (BID) | SUBCUTANEOUS | 0 refills | Status: DC

## 2017-04-03 NOTE — Progress Notes (Signed)
April/17: Last dose of Coumadin.  April/18: No Coumadin or Lovenox.  April/19: Inject Lovenox 100mg  in the fatty abdominal tissue at least 2 inches from the belly button twice a day about 12 hours apart, 8am and 8pm rotate sites. No Coumadin.  April/20: Inject Lovenox in the fatty tissue every 12 hours, 8am and 8pm. No Coumadin.  April/21: Inject Lovenox in the fatty tissue every 12 hours, 8am and 8pm. No Coumadin.  April/22: Inject Lovenox in the fatty tissue in the morning at 8 am (No PM dose). No Coumadin.  April/23: Procedure Day - No Lovenox - Resume Coumadin in the evening or as directed by doctor  April/24: Resume Lovenox inject in the fatty tissue every 12 hours and take Coumadin.  April/25: Inject Lovenox in the fatty tissue every 12 hours and take Coumadin.  April/26: Inject Lovenox in the fatty tissue every 12 hours and take Coumadin.  April/27: Inject Lovenox in the fatty tissue every 12 hours and take Coumadin.  April/28: Inject Lovenox in the fatty tissue every 12 hours and take Coumadin.  April/29:Inject Lovenox in the fatty tissue every 12 hours and take Coumadin.  April/30: Coumadin appt to check INR.

## 2017-04-03 NOTE — Patient Instructions (Signed)
April/17: Last dose of Coumadin.  April/18: No Coumadin or Lovenox.  April/19: Inject Lovenox 100mg  in the fatty abdominal tissue at least 2 inches from the belly button twice a day about 12 hours apart, 8am and 8pm rotate sites. No Coumadin.  April/20: Inject Lovenox in the fatty tissue every 12 hours, 8am and 8pm. No Coumadin.  April/21: Inject Lovenox in the fatty tissue every 12 hours, 8am and 8pm. No Coumadin.  April/22: Inject Lovenox in the fatty tissue in the morning at 8 am (No PM dose). No Coumadin.  April/23: Procedure Day - No Lovenox - Resume Coumadin in the evening or as directed by doctor  April/24: Resume Lovenox inject in the fatty tissue every 12 hours and take Coumadin.  April/25: Inject Lovenox in the fatty tissue every 12 hours and take Coumadin.  April/26: Inject Lovenox in the fatty tissue every 12 hours and take Coumadin.  April/27: Inject Lovenox in the fatty tissue every 12 hours and take Coumadin.  April/28: Inject Lovenox in the fatty tissue every 12 hours and take Coumadin.  April/29:Inject Lovenox in the fatty tissue every 12 hours and take Coumadin.  April/30: Coumadin appt to check INR.

## 2017-04-04 ENCOUNTER — Encounter (HOSPITAL_COMMUNITY)
Admission: RE | Admit: 2017-04-04 | Discharge: 2017-04-04 | Disposition: A | Discharge status: Home / Self Care | Payer: Medicare Other | Source: Ambulatory Visit | Attending: General Surgery | Admitting: General Surgery

## 2017-04-04 ENCOUNTER — Encounter (HOSPITAL_COMMUNITY): Payer: Self-pay

## 2017-04-04 DIAGNOSIS — Z01812 Encounter for preprocedural laboratory examination: Secondary | ICD-10-CM | POA: Insufficient documentation

## 2017-04-04 HISTORY — DX: Peripheral vascular disease, unspecified: I73.9

## 2017-04-04 HISTORY — DX: Gastro-esophageal reflux disease without esophagitis: K21.9

## 2017-04-04 LAB — CBC WITH DIFFERENTIAL/PLATELET
Basophils Absolute: 0 10*3/uL (ref 0.0–0.1)
Basophils Relative: 0 %
Eosinophils Absolute: 0.1 10*3/uL (ref 0.0–0.7)
Eosinophils Relative: 1 %
HCT: 45.4 % (ref 39.0–52.0)
Hemoglobin: 14.8 g/dL (ref 13.0–17.0)
LYMPHS ABS: 2.1 10*3/uL (ref 0.7–4.0)
LYMPHS PCT: 27 %
MCH: 27.7 pg (ref 26.0–34.0)
MCHC: 32.6 g/dL (ref 30.0–36.0)
MCV: 85 fL (ref 78.0–100.0)
MONOS PCT: 6 %
Monocytes Absolute: 0.5 10*3/uL (ref 0.1–1.0)
NEUTROS ABS: 5.1 10*3/uL (ref 1.7–7.7)
NEUTROS PCT: 66 %
PLATELETS: 241 10*3/uL (ref 150–400)
RBC: 5.34 MIL/uL (ref 4.22–5.81)
RDW: 14.5 % (ref 11.5–15.5)
WBC: 7.9 10*3/uL (ref 4.0–10.5)

## 2017-04-04 LAB — COMPREHENSIVE METABOLIC PANEL
ALT: 16 U/L — ABNORMAL LOW (ref 17–63)
AST: 25 U/L (ref 15–41)
Albumin: 4.3 g/dL (ref 3.5–5.0)
Alkaline Phosphatase: 52 U/L (ref 38–126)
Anion gap: 8 (ref 5–15)
BUN: 28 mg/dL — AB (ref 6–20)
CHLORIDE: 106 mmol/L (ref 101–111)
CO2: 28 mmol/L (ref 22–32)
Calcium: 10.1 mg/dL (ref 8.9–10.3)
Creatinine, Ser: 1.45 mg/dL — ABNORMAL HIGH (ref 0.61–1.24)
GFR, EST AFRICAN AMERICAN: 55 mL/min — AB (ref >60)
GFR, EST NON AFRICAN AMERICAN: 48 mL/min — AB (ref >60)
Glucose, Bld: 94 mg/dL (ref 65–99)
POTASSIUM: 4 mmol/L (ref 3.5–5.1)
SODIUM: 142 mmol/L (ref 135–145)
Total Bilirubin: 0.8 mg/dL (ref 0.3–1.2)
Total Protein: 7.3 g/dL (ref 6.5–8.1)

## 2017-04-04 NOTE — Pre-Procedure Instructions (Addendum)
Deandrew Hoecker [facility]  04/04/2017      [place], [place] [address] [place] [place] [address] Phone: [phone] Fax: [place], [place] [facility] [address] [place] [address] [place] [address] Phone: [phone] Fax: [phone]    Your procedure is scheduled on Monday, April 23.  Report to [facility] Admitting at 11:15 A.M.     Call this number if you have problems the morning of surgery: [phone]   Remember:  Do not eat food or drink liquids after midnight Sunday, April 22  Take these medicines the morning of surgery with A SIP OF WATER : digoxin (Otoe), metoprolol succinate (TOPROL-XL), pantoprazole (PROTONIX), sertraline (ZOLOFT).  May take Tylenol if needed.nitro if needed               Stop Coumadin and begin Lovenox as instructed by Coumadin Clinic.               STOP taking Aspirin, Aspirin Products (Goody Powder, Excedrin Migraine), Ibuprofen (Advil), Naproxen (Aleve),All Vitamins and Herbal Products (ie Fish Oil)  Special instructions:  [place]- Preparing For Surgery  Before surgery, you can play an important role. Because skin is not sterile, your skin needs to be as free of germs as possible. You can reduce the number of germs on your skin by washing with CHG (chlorahexidine gluconate) Soap before surgery.  CHG is an antiseptic cleaner which kills germs and bonds with the skin to continue killing germs even after washing.  Please do not use if you have an allergy to CHG or antibacterial soaps. If your skin becomes reddened/irritated stop using the CHG.  Do not shave (including legs and underarms) for at least 48 hours prior to first CHG shower. It is OK to shave your face.  Please follow these instructions carefully.   1. Shower the NIGHT BEFORE SURGERY and the MORNING OF SURGERY with CHG.   2. If you chose to wash your hair, wash your  hair first as usual with your normal shampoo.  3. After you shampoo, rinse your hair and body thoroughly to remove the shampoo.  4. Use CHG as you would any other liquid soap. You can apply CHG directly to the skin and wash gently with a scrungie or a clean washcloth.   5. Apply the CHG Soap to your body ONLY FROM THE NECK DOWN.  Do not use on open wounds or open sores. Avoid contact with your eyes, ears, mouth and genitals (private parts). Wash genitals (private parts) with your normal soap.  6. Wash thoroughly, paying special attention to the area where your surgery will be performed.  7. Thoroughly rinse your body with warm water from the neck down.  8. DO NOT shower/wash with your normal soap after using and rinsing off the CHG Soap.  9. Pat yourself dry with a CLEAN TOWEL.   10. Wear CLEAN PAJAMAS   11. Place CLEAN SHEETS on your bed the night of your first shower and DO NOT SLEEP WITH PETS. Day of Surgery: Do not apply any deodorants/lotions. Please wear clean clothes to the hospital/surgery center.    Do not wear jewelry, make-up or nail polish.  Do not wear powders, or perfumes.  Do not shave 48 hours prior to surgery.  Men may shave face and neck.  Do not bring valuables to the hospital.  [facility] is not responsible for  any belongings or valuables.  Contacts, dentures or bridgework may not be worn into surgery.  Leave your suitcase in the car.  After surgery it may be brought to your room.  For patients admitted to the hospital, discharge time will be determined by your treatment team.  Patients discharged the day of surgery will not be allowed to drive home.   Name and phone number of your driver:   -  Please read over the fact sheets that you were given.  Marland Kitchen

## 2017-04-06 NOTE — Progress Notes (Signed)
Anesthesia Chart Review: Patient is a 71 year old male scheduled for open repair of incisional hernia on 04/10/2017 by Dr. Dalbert Batman. Special needs posted as supine, anesthesia general, tap block bilateral.   History include former smoker (quit '00), CAD, anterior MI '00 s/p PCI LAD '00, mixed ischemic/non-ischemic cardiomyopathy, chronic systolic CHF, [employer] ICD (02/14/08 CRT-D; generator replacement 09/16/13), persistent afib s/p AV node ablation 01/25/17, CKD, dyslipidemia, DOE, GERD, anemia, depression, ED, BPH s/p TURP 01/25/16, C. difficile colitis and perforated duodenal ulcer s/p Phillip Heal patch 02/22/16, BLE DVT 02/23/16.   - PCP is listed as Dr. Kathryne Eriksson. - Primary cardiologist is Dr. Peter Martinique, last visit 02/21/17. He was patient would be "a suitable surgical candidate from my standpoint now." He did recommend Lovenox bridge. - EP cardiologist is Dr. Virl Axe.  Meds include digoxin, fenofibrate, ferrous sulfate, folic acid, Lasix, losartan, Toprol-XL, nitroglycerin, Protonix, KCl, pravastatin, Zoloft, warfarin (Lovenox bridge). Progress note by Raquel Rodriguez-Guzman, RPH outlines his warfarin and Lovenox bridge instructions.   BP 127/66   Pulse 100   Temp 36.4 C   Resp 20   Ht 5\' 10"  (1.778 m)   Wt 212 lb 9.6 oz (96.4 kg)   SpO2 99%   BMI 30.50 kg/m  HR was not apparently rechecked at PAT.   EKG 01/03/17: V-paced, underlying afib. (He is s/p AV node ablation 01/25/17.)  Echo 01/06/16: Study Conclusions - Left ventricle: The cavity size was severely dilated. Wall thickness was normal. Systolic function was severely reduced. The estimated ejection fraction was in the range of 20% to 25%. Dyskinesis of the apical myocardium. Akinesis and scarring of the entireanteroseptal, anterior, and anterolateral myocardium. Doppler parameters are consistent with restrictive physiology, indicative of decreased left ventricular diastolic compliance and/or increased left atrial  pressure. No evidence of thrombus. Acoustic contrast opacification revealed no evidence ofthrombus. Acoustic contrast opacification revealed no evidence ofthrombus. - Ventricular septum: Septal motion showed paradox. These changes are consistent with a left bundle branch block. - Mitral valve: Calcified annulus. There was mild to moderate regurgitation. - Left atrium: The atrium was moderately dilated. - Right atrium: The atrium was mildly dilated. - Atrial septum: No defect or patent foramen ovale was identified. - Pulmonary arteries: PA peak pressure: 32 mm Hg (S). (Comparison EF 25-30% on 06/24/13.)  ETT 04/01/14: Conclusions:  Baseline ECG show atrial fibrillation, nonspecific IVCD and triggered BiV pacing. Overall BiV pacing percentage is low. Heart rate response during exercise is exaggerated with complete loss of BiV pacing after only 2 minutes in the first stage of the Bruce protocol ECG stress test is uninterpretable for ischemia due to baseline major IVCD.  Cardiac cath 02/05/08: FINDINGS: 1. Left Main: Normal-appearing. 2. LAD: Patent proximal stent with mild 20-30% in-stent restenosis, with mid and distal luminal irregularities. 3. LCX: Nondominant with mild luminal irregularities. 4. OM-1/OM-2: Moderate-sized vessels with mild to moderate luminal irregularities. 5. RCA: Dominant with mild to moderate luminal irregularities. 6. LV: EF is 25-30%. LVEDP is 25 mmHg. IMPRESSION: 1. Nonobstructive coronary arteries with patent proximal left anterior descending artery stent with mild 20-30% in-stent restenosis noted. 2. Moderate to severe left ventricular dysfunction with an ejection fraction of 25-30%. 3. Patent left circumflex, right coronary artery with no significant obstructive disease noted. PLAN: At this time we will continue maximal medical treatment and the patient will be referred for a bi-V ICD implant.   Per perioperative cardiac device Rx form,  procedure will likely interfere with device function, so tachycardia therapies should be disabled  with a asynchronous pacing during procedure and returned to normal after procedure recommended. (Next ICM transmission is scheduled 04/13/17.) He is pacer dependent.  Preoperative labs noted. Cr 1.45, stable. CBC WNL. He will need PT/PTT on arrival.   Patient had recent AV node ablation to help with rate control. He had follow-up with Dr. Martinique who felt patient was okay to proceed with surgery. He will get vitals on arrival. AM meds to include digoxin and metoprolol. If coags are acceptable, HR controlled and otherwise no acute changes then I would anticipate that he could proceed as planned. [employer] rep to reprogram ICD prior to and after surgery (RN staff to notify).   [name] Hugh [facility] Short Stay [name] Phone [phone] 04/06/2017 12:20 PM

## 2017-04-07 MED ORDER — VANCOMYCIN HCL 10 G IV SOLR
1500.0000 mg | INTRAVENOUS | Status: AC
  Administered 2017-04-10: 1500 mg via INTRAVENOUS
  Filled 2017-04-07: qty 1500

## 2017-04-07 NOTE — Progress Notes (Signed)
Notified Tomi Bamberger of date and time of surgery. 04/10/17 @ 1315

## 2017-04-08 NOTE — H&P (Signed)
[name] Location: [facility] Surgery Patient #: [id number] DOB: [birth year] Married / Language: English / Race: White Male        History of Present Illness        The patient is a 71 year old male who presents with an incisional hernia. This is a very pleasant 71 year old gentleman who returns with his wife to make plans for elective repair of ventral incisional hernia.      The hernia is getting larger. It is a little bit uncomfortable. Never incarcerated. He hates the abdominal binder. It is very inconvenient. He has seen Dr. Peter [name] reveals that he is acceptable risk. Dr. Caryl [name] is his electrophysiologist. Dr. Michail [name] is his gastroenterologist. [name] is his PCP at [facility] family practice. The patient, his PCP, Dr. Martinique had decided that transition from Coumadin to a Lovenox bridge and back again will be handled by Dr. Doug [name] Coumadin clinic staff.      He presented with a duodenal ulcer in April 2017 and Dr. Donne [name] took him to the operating room for patch closure. Said to be a tenuous closure. Skin packed open. Did heal. On the ventilator for 2 or 3 days. He did recover. Follow-up endoscopy by Dr. Michail [name] on August 31, 2016 showed some gastritis and a polyp but no active ulcer. He is doing well from a GI standpoint. Significant comorbidities include systolic congestive heart failure, ischemic cardiomyopathy with ejection fraction 20-23%. Myocardial infarction year 2000. Pacemaker and AICD in place for atrial fibrillation. Has had endocardial ablation on January 25, 2017. This was AV node ablation. He did well with that. He remains on Coumadin. History of C. difficile colitis. TURP. CKD3. Hypertension. History of DVT lower extremity.      We had a long talk. I told him that he had a incisional hernia and also had diastases recti. I told him that the diastases recti may persist and he may have a bulge but the hernia could be  repaired. We will plan an open technique with a retrorectus repair, possible TAR release. Implant mesh.  he will be scheduled for open repair of ventral incisional hernia with mesh. I discussed the indications, details, techniques, and numerous risk of the surgery with him and his wife. He is aware the risk of bleeding, infection, recurrence of the hernia, chronic pain, injury to adjacent organs requiring major reconstructive surgery and different mesh, cardiac pulmonary and thromboembolic problems. He understands these issues well. All of his questions were answered. He agrees with this plan.      [facility]       Lovenox bridge to be managed by [name] office       Vancomycin perioperatively   Allergies  No Known Drug Allergies  Allergies Reconciled   Medication History  Pantoprazole Sodium (40MG  Tablet DR, 1 (one) Tablet Oral two times daily, Taken starting 02/20/2017) Active. Furosemide (40MG  Tablet, Oral daily) Active. Entresto (24-26MG  Tablet, Oral daily) Active. Digoxin (125MCG Tablet, Oral daily) Active. Metoprolol Tartrate (25MG  Tablet, Oral daily) Active. Losartan Potassium (25MG  Tablet, Oral daily) Active. Myrbetriq (25MG  Tablet ER 24HR, Oral) Active. Carvedilol (25MG  Tablet, Oral) Active. Digoxin (250MCG Tablet, Oral) Active. Pravastatin Sodium (40MG  Tablet, Oral) Active. Sertraline HCl (100MG  Tablet, Oral) Active. Warfarin Sodium (5MG  Tablet, Oral) Active. Furosemide (20MG  Tablet, Oral) Active. Biotin (1MG  Capsule, Oral) Active. Metoprolol-HCTZ ER (25-12.5MG  Tablet ER 24HR, Oral) Active. Atorvastatin Calcium (20MG  Tablet, Oral) Active. Folic Acid (0.8MG  Capsule, Oral) Active. Nitrostat (0.3MG  Tab Sublingual, Sublingual)  Active. Medications Reconciled  Vitals  Weight: 213 lb Height: 70in Body Surface Area: 2.14 m Body Mass Index: 30.56 kg/m  Temp.: 62F  Pulse: 89 (Regular)  BP: 122/82 (Sitting,  Left Arm, Standard)   Physical Exam  General Mental Status-Alert. General Appearance-Not in acute distress. Build & Nutrition-Well nourished. Posture-Normal posture. Gait-Normal. Note: Alert. No distress. Color good. Doesn't appear short of breath. Ambulating independently. Mental status normal.   Head and Neck Head-normocephalic, atraumatic with no lesions or palpable masses. Trachea-midline. Thyroid Gland Characteristics - normal size and consistency and no palpable nodules.  Chest and Lung Exam Chest and lung exam reveals -on auscultation, normal breath sounds, no adventitious sounds and normal vocal resonance. Note: AICD pacemaker right infraclavicular area   Cardiovascular Cardiovascular examination reveals -normal heart sounds, regular rate and rhythm with no murmurs and femoral artery auscultation bilaterally reveals normal pulses, no bruits, no thrills.  Abdomen Note: Soft and nontender. Nondistended. Liver and spleen not enlarged. Examined supine and standing. He has a diastases recti. He also has an incisional hernia which is smaller. The hernia sac is 15 cm at least. The defect is probably about 8 cm. I could feel the edges of the muscle. Midline scar completely epithelialized but a little bit wide.   Neurologic Neurologic evaluation reveals -alert and oriented x 3 with no impairment of recent or remote memory, normal attention span and ability to concentrate, normal sensation and normal coordination.  Musculoskeletal Normal Exam - Bilateral-Upper Extremity Strength Normal and Lower Extremity Strength Normal.    Assessment & Plan  INCISIONAL HERNIA, WITHOUT OBSTRUCTION OR GANGRENE (K43.2)  Current Plans  You have requested that we schedule elective surgery to repair your incisional hernia That is very reasonable  Dr. Martinique feels that you are at acceptable risk for surgery of your ventral hernia Dr. Doug [name] Coumadin clinic staff  will take care of the Lovenox bridge You will need to stop your Coumadin 5 or 6 days preop you will stop the Lovenox the day before surgery Surgery will be at [facility] hospital  you will be scheduled for open repair of your ventral incisional hernia with mesh. 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 DIFFICILE COLITIS (A04.72) ATRIAL FIBRILLATION WITH RVR (I48.91) POSTOPERATIVE RESPIRATORY FAILURE (J95.821) PERFORATED DUODENAL ULCER (K26.5)   Jayne Peckenpaugh M. Dalbert Batman, M.D., [facility] General and Minimally invasive Surgery Breast and Colorectal Surgery Office:   [phone] Pager:   [phone]

## 2017-04-10 ENCOUNTER — Encounter (HOSPITAL_COMMUNITY)
Admission: RE | Disposition: A | Discharge status: Home / Self Care | Payer: Self-pay | Source: Ambulatory Visit | Attending: General Surgery

## 2017-04-10 ENCOUNTER — Encounter (HOSPITAL_COMMUNITY): Payer: Self-pay | Admitting: Certified Registered"

## 2017-04-10 ENCOUNTER — Inpatient Hospital Stay (HOSPITAL_COMMUNITY): Payer: Medicare Other | Admitting: Certified Registered"

## 2017-04-10 ENCOUNTER — Inpatient Hospital Stay (HOSPITAL_COMMUNITY)
Admission: RE | Admit: 2017-04-10 | Discharge: 2017-04-14 | DRG: 353 | Disposition: A | Discharge status: Home / Self Care | Payer: Medicare Other | Source: Ambulatory Visit | Attending: General Surgery | Admitting: General Surgery

## 2017-04-10 ENCOUNTER — Inpatient Hospital Stay (HOSPITAL_COMMUNITY): Payer: Medicare Other | Admitting: Vascular Surgery

## 2017-04-10 DIAGNOSIS — Z86718 Personal history of other venous thrombosis and embolism: Secondary | ICD-10-CM | POA: Diagnosis not present

## 2017-04-10 DIAGNOSIS — I472 Ventricular tachycardia: Secondary | ICD-10-CM | POA: Diagnosis not present

## 2017-04-10 DIAGNOSIS — I5022 Chronic systolic (congestive) heart failure: Secondary | ICD-10-CM | POA: Diagnosis present

## 2017-04-10 DIAGNOSIS — Z87891 Personal history of nicotine dependence: Secondary | ICD-10-CM

## 2017-04-10 DIAGNOSIS — Z7901 Long term (current) use of anticoagulants: Secondary | ICD-10-CM | POA: Diagnosis not present

## 2017-04-10 DIAGNOSIS — E876 Hypokalemia: Secondary | ICD-10-CM | POA: Diagnosis not present

## 2017-04-10 DIAGNOSIS — Z8619 Personal history of other infectious and parasitic diseases: Secondary | ICD-10-CM | POA: Diagnosis not present

## 2017-04-10 DIAGNOSIS — N183 Chronic kidney disease, stage 3 (moderate): Secondary | ICD-10-CM | POA: Diagnosis present

## 2017-04-10 DIAGNOSIS — I2510 Atherosclerotic heart disease of native coronary artery without angina pectoris: Secondary | ICD-10-CM | POA: Diagnosis present

## 2017-04-10 DIAGNOSIS — Z79899 Other long term (current) drug therapy: Secondary | ICD-10-CM

## 2017-04-10 DIAGNOSIS — I481 Persistent atrial fibrillation: Secondary | ICD-10-CM | POA: Diagnosis present

## 2017-04-10 DIAGNOSIS — I255 Ischemic cardiomyopathy: Secondary | ICD-10-CM | POA: Diagnosis present

## 2017-04-10 DIAGNOSIS — K432 Incisional hernia without obstruction or gangrene: Principal | ICD-10-CM | POA: Diagnosis present

## 2017-04-10 DIAGNOSIS — I4581 Long QT syndrome: Secondary | ICD-10-CM | POA: Diagnosis present

## 2017-04-10 DIAGNOSIS — Z95810 Presence of automatic (implantable) cardiac defibrillator: Secondary | ICD-10-CM | POA: Diagnosis not present

## 2017-04-10 DIAGNOSIS — Z955 Presence of coronary angioplasty implant and graft: Secondary | ICD-10-CM | POA: Diagnosis not present

## 2017-04-10 DIAGNOSIS — I252 Old myocardial infarction: Secondary | ICD-10-CM

## 2017-04-10 DIAGNOSIS — I130 Hypertensive heart and chronic kidney disease with heart failure and stage 1 through stage 4 chronic kidney disease, or unspecified chronic kidney disease: Secondary | ICD-10-CM | POA: Diagnosis present

## 2017-04-10 DIAGNOSIS — Q7959 Other congenital malformations of abdominal wall: Secondary | ICD-10-CM

## 2017-04-10 HISTORY — PX: INSERTION OF MESH: SHX5868

## 2017-04-10 HISTORY — DX: Incisional hernia without obstruction or gangrene: K43.2

## 2017-04-10 HISTORY — PX: INCISIONAL HERNIA REPAIR: SHX193

## 2017-04-10 LAB — APTT: aPTT: 29 seconds (ref 24–36)

## 2017-04-10 LAB — PROTIME-INR
INR: 1.09
Prothrombin Time: 14.2 seconds (ref 11.4–15.2)

## 2017-04-10 SURGERY — REPAIR, HERNIA, INCISIONAL
Anesthesia: General | Site: Abdomen

## 2017-04-10 MED ORDER — PHENYLEPHRINE 40 MCG/ML (10ML) SYRINGE FOR IV PUSH (FOR BLOOD PRESSURE SUPPORT)
PREFILLED_SYRINGE | INTRAVENOUS | Status: AC
  Filled 2017-04-10: qty 10

## 2017-04-10 MED ORDER — FENTANYL CITRATE (PF) 100 MCG/2ML IJ SOLN
INTRAMUSCULAR | Status: DC | PRN
  Administered 2017-04-10: 50 ug via INTRAVENOUS
  Administered 2017-04-10: 100 ug via INTRAVENOUS

## 2017-04-10 MED ORDER — HYDROMORPHONE HCL 1 MG/ML IJ SOLN: 1.0000 mg | INTRAMUSCULAR | Status: DC | PRN

## 2017-04-10 MED ORDER — BUPIVACAINE HCL (PF) 0.25 % IJ SOLN
INTRAMUSCULAR | Status: AC
  Filled 2017-04-10: qty 30

## 2017-04-10 MED ORDER — ADULT MULTIVITAMIN W/MINERALS CH
ORAL_TABLET | Freq: Every day | ORAL | Status: DC
  Administered 2017-04-11 – 2017-04-13 (×3): 1 via ORAL
  Filled 2017-04-10 (×3): qty 1

## 2017-04-10 MED ORDER — ONDANSETRON HCL 4 MG/2ML IJ SOLN
INTRAMUSCULAR | Status: AC
  Filled 2017-04-10: qty 2

## 2017-04-10 MED ORDER — LACTATED RINGERS IV SOLN
INTRAVENOUS | Status: DC
  Administered 2017-04-10: 100 mL/h via INTRAVENOUS
  Administered 2017-04-11 – 2017-04-13 (×2): via INTRAVENOUS
  Administered 2017-04-14: 1000 mL via INTRAVENOUS

## 2017-04-10 MED ORDER — FERROUS SULFATE 325 (65 FE) MG PO TABS
325.0000 mg | ORAL_TABLET | Freq: Two times a day (BID) | ORAL | Status: DC
  Administered 2017-04-10 – 2017-04-12 (×5): 325 mg via ORAL
  Filled 2017-04-10 (×5): qty 1

## 2017-04-10 MED ORDER — METOPROLOL SUCCINATE ER 25 MG PO TB24
25.0000 mg | ORAL_TABLET | Freq: Every day | ORAL | Status: DC
  Administered 2017-04-11 – 2017-04-13 (×3): 25 mg via ORAL
  Filled 2017-04-10 (×3): qty 1

## 2017-04-10 MED ORDER — FENTANYL CITRATE (PF) 100 MCG/2ML IJ SOLN
25.0000 ug | INTRAMUSCULAR | Status: DC | PRN
  Administered 2017-04-10 (×4): 25 ug via INTRAVENOUS

## 2017-04-10 MED ORDER — FENTANYL CITRATE (PF) 100 MCG/2ML IJ SOLN
INTRAMUSCULAR | Status: AC
  Filled 2017-04-10: qty 2

## 2017-04-10 MED ORDER — DIGOXIN 0.0625 MG HALF TABLET
62.5000 ug | ORAL_TABLET | Freq: Every day | ORAL | Status: DC
  Administered 2017-04-11 – 2017-04-13 (×3): 62.5 ug via ORAL
  Filled 2017-04-10 (×5): qty 1

## 2017-04-10 MED ORDER — LIDOCAINE HCL (CARDIAC) 20 MG/ML IV SOLN
INTRAVENOUS | Status: DC | PRN
  Administered 2017-04-10: 100 mg via INTRAVENOUS

## 2017-04-10 MED ORDER — ACETAMINOPHEN 10 MG/ML IV SOLN
1000.0000 mg | INTRAVENOUS | Status: AC
  Administered 2017-04-10: 1000 mg via INTRAVENOUS
  Filled 2017-04-10: qty 100

## 2017-04-10 MED ORDER — BUPIVACAINE-EPINEPHRINE (PF) 0.25% -1:200000 IJ SOLN
INTRAMUSCULAR | Status: DC | PRN
  Administered 2017-04-10: 40 mL

## 2017-04-10 MED ORDER — METHOCARBAMOL 500 MG PO TABS
500.0000 mg | ORAL_TABLET | Freq: Four times a day (QID) | ORAL | Status: DC | PRN
  Administered 2017-04-10 – 2017-04-13 (×4): 500 mg via ORAL
  Filled 2017-04-10 (×4): qty 1

## 2017-04-10 MED ORDER — FENOFIBRATE 160 MG PO TABS
160.0000 mg | ORAL_TABLET | Freq: Every day | ORAL | Status: DC
  Administered 2017-04-10 – 2017-04-13 (×4): 160 mg via ORAL
  Filled 2017-04-10 (×5): qty 1

## 2017-04-10 MED ORDER — PRAVASTATIN SODIUM 40 MG PO TABS
40.0000 mg | ORAL_TABLET | Freq: Every day | ORAL | Status: DC
  Administered 2017-04-10 – 2017-04-13 (×4): 40 mg via ORAL
  Filled 2017-04-10 (×4): qty 1

## 2017-04-10 MED ORDER — SUGAMMADEX SODIUM 200 MG/2ML IV SOLN
INTRAVENOUS | Status: DC | PRN
  Administered 2017-04-10: 200 mg via INTRAVENOUS

## 2017-04-10 MED ORDER — FENTANYL CITRATE (PF) 250 MCG/5ML IJ SOLN
INTRAMUSCULAR | Status: AC
  Filled 2017-04-10: qty 5

## 2017-04-10 MED ORDER — ONDANSETRON HCL 4 MG/2ML IJ SOLN
4.0000 mg | Freq: Four times a day (QID) | INTRAMUSCULAR | Status: DC | PRN
  Administered 2017-04-12: 4 mg via INTRAVENOUS
  Filled 2017-04-10: qty 2

## 2017-04-10 MED ORDER — PROPOFOL 10 MG/ML IV BOLUS
INTRAVENOUS | Status: DC | PRN
  Administered 2017-04-10: 130 mg via INTRAVENOUS

## 2017-04-10 MED ORDER — LACTATED RINGERS IV SOLN
INTRAVENOUS | Status: DC
  Administered 2017-04-10: 13:00:00 via INTRAVENOUS

## 2017-04-10 MED ORDER — VITAMIN C 500 MG PO TABS
250.0000 mg | ORAL_TABLET | Freq: Two times a day (BID) | ORAL | Status: DC
  Administered 2017-04-10 – 2017-04-13 (×7): 250 mg via ORAL
  Filled 2017-04-10 (×7): qty 1

## 2017-04-10 MED ORDER — NITROGLYCERIN 0.4 MG SL SUBL: 0.4000 mg | SUBLINGUAL_TABLET | SUBLINGUAL | Status: DC | PRN

## 2017-04-10 MED ORDER — HYDROCODONE-ACETAMINOPHEN 5-325 MG PO TABS
1.0000 | ORAL_TABLET | ORAL | Status: DC | PRN
  Administered 2017-04-10 – 2017-04-13 (×7): 2 via ORAL
  Filled 2017-04-10 (×8): qty 2

## 2017-04-10 MED ORDER — SENNA 8.6 MG PO TABS
1.0000 | ORAL_TABLET | Freq: Two times a day (BID) | ORAL | Status: DC
  Administered 2017-04-10 – 2017-04-11 (×3): 8.6 mg via ORAL
  Filled 2017-04-10 (×3): qty 1

## 2017-04-10 MED ORDER — SUGAMMADEX SODIUM 200 MG/2ML IV SOLN
INTRAVENOUS | Status: AC
  Filled 2017-04-10: qty 2

## 2017-04-10 MED ORDER — ONDANSETRON 4 MG PO TBDP: 4.0000 mg | ORAL_TABLET | Freq: Four times a day (QID) | ORAL | Status: DC | PRN

## 2017-04-10 MED ORDER — SERTRALINE HCL 100 MG PO TABS
100.0000 mg | ORAL_TABLET | Freq: Every day | ORAL | Status: DC
  Administered 2017-04-11 – 2017-04-13 (×3): 100 mg via ORAL
  Filled 2017-04-10 (×3): qty 1

## 2017-04-10 MED ORDER — ACETAMINOPHEN 500 MG PO TABS: 500.0000 mg | ORAL_TABLET | Freq: Three times a day (TID) | ORAL | Status: DC | PRN

## 2017-04-10 MED ORDER — 0.9 % SODIUM CHLORIDE (POUR BTL) OPTIME
TOPICAL | Status: DC | PRN
  Administered 2017-04-10 (×2): 1000 mL

## 2017-04-10 MED ORDER — PANTOPRAZOLE SODIUM 40 MG PO TBEC
40.0000 mg | DELAYED_RELEASE_TABLET | Freq: Two times a day (BID) | ORAL | Status: DC
  Administered 2017-04-10 – 2017-04-13 (×7): 40 mg via ORAL
  Filled 2017-04-10 (×7): qty 1

## 2017-04-10 MED ORDER — FUROSEMIDE 40 MG PO TABS
40.0000 mg | ORAL_TABLET | Freq: Two times a day (BID) | ORAL | Status: DC
  Administered 2017-04-10 – 2017-04-13 (×7): 40 mg via ORAL
  Filled 2017-04-10 (×7): qty 1

## 2017-04-10 MED ORDER — CHLORHEXIDINE GLUCONATE CLOTH 2 % EX PADS: 6.0000 | MEDICATED_PAD | Freq: Once | CUTANEOUS | Status: DC

## 2017-04-10 MED ORDER — ONDANSETRON HCL 4 MG/2ML IJ SOLN
INTRAMUSCULAR | Status: DC | PRN
  Administered 2017-04-10: 4 mg via INTRAVENOUS

## 2017-04-10 MED ORDER — ROCURONIUM BROMIDE 10 MG/ML (PF) SYRINGE
PREFILLED_SYRINGE | INTRAVENOUS | Status: AC
  Filled 2017-04-10: qty 5

## 2017-04-10 MED ORDER — FOLIC ACID 1 MG PO TABS
1.0000 mg | ORAL_TABLET | Freq: Every day | ORAL | Status: DC
  Administered 2017-04-11 – 2017-04-13 (×3): 1 mg via ORAL
  Filled 2017-04-10 (×3): qty 1

## 2017-04-10 MED ORDER — PHENYLEPHRINE HCL 10 MG/ML IJ SOLN
INTRAMUSCULAR | Status: DC | PRN
  Administered 2017-04-10: 80 ug via INTRAVENOUS
  Administered 2017-04-10 (×2): 40 ug via INTRAVENOUS
  Administered 2017-04-10: 160 ug via INTRAVENOUS
  Administered 2017-04-10: 80 ug via INTRAVENOUS

## 2017-04-10 MED ORDER — LIDOCAINE 2% (20 MG/ML) 5 ML SYRINGE
INTRAMUSCULAR | Status: AC
  Filled 2017-04-10: qty 5

## 2017-04-10 MED ORDER — PROPOFOL 10 MG/ML IV BOLUS
INTRAVENOUS | Status: AC
  Filled 2017-04-10: qty 20

## 2017-04-10 MED ORDER — LOSARTAN POTASSIUM 50 MG PO TABS
25.0000 mg | ORAL_TABLET | Freq: Every day | ORAL | Status: DC
  Administered 2017-04-10 – 2017-04-13 (×4): 25 mg via ORAL
  Filled 2017-04-10 (×4): qty 1

## 2017-04-10 MED ORDER — POTASSIUM CHLORIDE CRYS ER 20 MEQ PO TBCR
20.0000 meq | EXTENDED_RELEASE_TABLET | Freq: Two times a day (BID) | ORAL | Status: DC
  Administered 2017-04-10 – 2017-04-12 (×5): 20 meq via ORAL
  Filled 2017-04-10 (×5): qty 1

## 2017-04-10 MED ORDER — ROCURONIUM BROMIDE 100 MG/10ML IV SOLN
INTRAVENOUS | Status: DC | PRN
  Administered 2017-04-10: 10 mg via INTRAVENOUS
  Administered 2017-04-10: 20 mg via INTRAVENOUS
  Administered 2017-04-10: 50 mg via INTRAVENOUS

## 2017-04-10 SURGICAL SUPPLY — 50 items
ADH SKN CLS APL DERMABOND .7 (GAUZE/BANDAGES/DRESSINGS) ×2
BAG DECANTER FOR FLEXI CONT (MISCELLANEOUS) IMPLANT
BINDER ABDOMINAL 12 ML 46-62 (SOFTGOODS) ×4 IMPLANT
BLADE 11 SAFETY STRL DISP (BLADE) ×2 IMPLANT
BLADE CLIPPER SURG (BLADE) ×2 IMPLANT
CANISTER SUCT 3000ML PPV (MISCELLANEOUS) ×4 IMPLANT
CHLORAPREP W/TINT 26ML (MISCELLANEOUS) ×4 IMPLANT
COVER SURGICAL LIGHT HANDLE (MISCELLANEOUS) ×4 IMPLANT
DERMABOND ADVANCED (GAUZE/BANDAGES/DRESSINGS) ×2
DERMABOND ADVANCED .7 DNX12 (GAUZE/BANDAGES/DRESSINGS) IMPLANT
DEVICE TROCAR PUNCTURE CLOSURE (ENDOMECHANICALS) ×4 IMPLANT
DRAPE LAPAROSCOPIC ABDOMINAL (DRAPES) ×4 IMPLANT
ELECT CAUTERY BLADE 6.4 (BLADE) ×4 IMPLANT
ELECT REM PT RETURN 9FT ADLT (ELECTROSURGICAL) ×4
ELECTRODE REM PT RTRN 9FT ADLT (ELECTROSURGICAL) ×2 IMPLANT
EVACUATOR SILICONE 100CC (DRAIN) ×2 IMPLANT
GAUZE SPONGE 4X4 12PLY STRL (GAUZE/BANDAGES/DRESSINGS) IMPLANT
GAUZE SPONGE 4X4 12PLY STRL LF (GAUZE/BANDAGES/DRESSINGS) ×2 IMPLANT
GLOVE BIOGEL PI IND STRL 7.0 (GLOVE) IMPLANT
GLOVE BIOGEL PI INDICATOR 7.0 (GLOVE) ×2
GLOVE EUDERMIC 7 POWDERFREE (GLOVE) ×4 IMPLANT
GLOVE SURG SS PI 7.0 STRL IVOR (GLOVE) ×2 IMPLANT
GOWN STRL REUS W/ TWL LRG LVL3 (GOWN DISPOSABLE) ×2 IMPLANT
GOWN STRL REUS W/ TWL XL LVL3 (GOWN DISPOSABLE) ×2 IMPLANT
GOWN STRL REUS W/TWL LRG LVL3 (GOWN DISPOSABLE) ×4
GOWN STRL REUS W/TWL XL LVL3 (GOWN DISPOSABLE) ×8
KIT BASIN OR (CUSTOM PROCEDURE TRAY) ×4 IMPLANT
KIT ROOM TURNOVER OR (KITS) ×4 IMPLANT
MESH ULTRAPRO 12X12 30X30CM (Mesh General) ×2 IMPLANT
NDL HYPO 25GX1X1/2 BEV (NEEDLE) IMPLANT
NEEDLE HYPO 25GX1X1/2 BEV (NEEDLE) IMPLANT
NS IRRIG 1000ML POUR BTL (IV SOLUTION) ×4 IMPLANT
PACK GENERAL/GYN (CUSTOM PROCEDURE TRAY) ×4 IMPLANT
PAD ABD 8X10 STRL (GAUZE/BANDAGES/DRESSINGS) ×2 IMPLANT
PAD ARMBOARD 7.5X6 YLW CONV (MISCELLANEOUS) ×4 IMPLANT
SPONGE LAP 18X18 X RAY DECT (DISPOSABLE) ×2 IMPLANT
STAPLER VISISTAT 35W (STAPLE) ×2 IMPLANT
SUT ETHILON 3 0 FSL (SUTURE) ×2 IMPLANT
SUT NOVA 0 T20/GS 25DT 4464 63 (SUTURE) IMPLANT
SUT NOVA 1 T20/GS 25DT (SUTURE) ×4 IMPLANT
SUT NOVA NAB GS-21 1 T12 (SUTURE) ×2 IMPLANT
SUT PDS AB 1 CTX 36 (SUTURE) ×4 IMPLANT
SUT VIC AB 2-0 CT1 27 (SUTURE)
SUT VIC AB 2-0 CT1 TAPERPNT 27 (SUTURE) IMPLANT
SUT VIC AB 3-0 CT1 27 (SUTURE)
SUT VIC AB 3-0 CT1 TAPERPNT 27 (SUTURE) IMPLANT
SYR CONTROL 10ML LL (SYRINGE) IMPLANT
TOWEL OR 17X24 6PK STRL BLUE (TOWEL DISPOSABLE) ×4 IMPLANT
TOWEL OR 17X26 10 PK STRL BLUE (TOWEL DISPOSABLE) ×4 IMPLANT
TRAY FOLEY CATH SILVER 16FR (SET/KITS/TRAYS/PACK) ×2 IMPLANT

## 2017-04-10 NOTE — Progress Notes (Signed)
Call to [facility] at [employer], she will see this pt. & manage their device in a short time.

## 2017-04-10 NOTE — Transfer of Care (Signed)
Immediate Anesthesia Transfer of Care Note  Patient: [name]  Procedure(s) Performed: Procedure(s): OPEN REPAIR INCISIONAL VENTRAL HERNIA (N/A) INSERTION OF MESH (N/A)  Patient Location: PACU  Anesthesia Type:General and Regional  Level of Consciousness: awake, alert  and oriented  Airway & Oxygen Therapy: Patient Spontanous Breathing  Post-op Assessment: Report given to RN and Post -op Vital signs reviewed and stable  Post vital signs: Reviewed and stable  Last Vitals:  Vitals:   04/10/17 1144 04/10/17 1609  BP: 122/66   Pulse: 68   Resp: 20   Temp: 36.8 C (P) 36.6 C    Last Pain:  Vitals:   04/10/17 1144  TempSrc: Oral         Complications: No apparent anesthesia complications

## 2017-04-10 NOTE — Anesthesia Procedure Notes (Signed)
Anesthesia Regional Block: TAP block   Pre-Anesthetic Checklist: ,, timeout performed, Correct Patient, Correct Site, Correct Laterality, Correct Procedure, Correct Position, site marked, Risks and benefits discussed, Surgical consent,  Pre-op evaluation,  Post-op pain management  Laterality: Right and Left  Prep: chloraprep       Needles:   Needle Type: Stimiplex     Needle Length: 9cm      Additional Needles:   Procedures: ultrasound guided,,,,,,,,  Narrative:  Start time: 04/10/2017 3:50 PM End time: 04/10/2017 3:57 PM Injection made incrementally with aspirations every 5 mL.  Performed by: Personally  Anesthesiologist: Nolon Nations  Additional Notes: Patient tolerated well. Good fascial spread noted.

## 2017-04-10 NOTE — Interval H&P Note (Signed)
History and Physical Interval Note:  04/10/2017 11:40 AM  [name]  has presented today for surgery, with the diagnosis of incisional hernia  The various methods of treatment have been discussed with the patient and family. After consideration of risks, benefits and other options for treatment, the patient has consented to  Procedure(s): [place] (N/A) INSERTION OF MESH (N/A) as a surgical intervention .  The patient's history has been reviewed, patient examined, no change in status, stable for surgery.  I have reviewed the patient's chart and labs.  Questions were answered to the patient's satisfaction.     [name]

## 2017-04-10 NOTE — Progress Notes (Signed)
Patient transferred to 6n12 alert and oriented, IV intact and fluids running, foley cath in place, JP drain and wound to abdomen noted, minimal pain, wife downstairs waiting to come up. Will continue to monitor.

## 2017-04-10 NOTE — Anesthesia Preprocedure Evaluation (Signed)
Anesthesia Evaluation  Patient identified by MRN, date of birth, ID band Patient awake    Reviewed: Allergy & Precautions, H&P , Patient's Chart, lab work & pertinent test results, reviewed documented beta blocker date and time   Airway Mallampati: II  TM Distance: >3 FB Neck ROM: full    Dental no notable dental hx.    Pulmonary sleep apnea , former smoker,    Pulmonary exam normal breath sounds clear to auscultation       Cardiovascular + Cardiac Defibrillator  Rhythm:regular Rate:Normal     Neuro/Psych    GI/Hepatic   Endo/Other    Renal/GU      Musculoskeletal   Abdominal   Peds  Hematology   Anesthesia Other Findings Coronary artery disease    MI  Ischemic cardiomyopathy  EF 23%  Persistent atrial fibrillation (HCC)on coumadin Chronic renal insufficiency     Other primary cardiomyopathies  Presence of permanent cardiac pacemaker   AICD  present    Dysrhythmia atrial fibrillation CHF     Shortness of breath dyspnea  on exertion Anxiety   Anemia   Peripheral vascular disease          Reproductive/Obstetrics                             Anesthesia Physical Anesthesia Plan  ASA: II  Anesthesia Plan: General   Post-op Pain Management:    Induction: Intravenous  Airway Management Planned: Oral ETT  Additional Equipment:   Intra-op Plan:   Post-operative Plan: Extubation in OR  Informed Consent: I have reviewed the patients History and Physical, chart, labs and discussed the procedure including the risks, benefits and alternatives for the proposed anesthesia with the patient or authorized representative who has indicated his/her understanding and acceptance.   Dental Advisory Given  Plan Discussed with: CRNA and Surgeon  Anesthesia Plan Comments: (  )        Anesthesia Quick Evaluation

## 2017-04-10 NOTE — Anesthesia Procedure Notes (Signed)
Procedure Name: Intubation Date/Time: 04/10/2017 2:02 PM Performed by: Manuela Schwartz B Pre-anesthesia Checklist: Patient identified, Emergency Drugs available, Suction available, Patient being monitored and Timeout performed Patient Re-evaluated:Patient Re-evaluated prior to inductionOxygen Delivery Method: Circle system utilized Preoxygenation: Pre-oxygenation with 100% oxygen Intubation Type: IV induction Ventilation: Mask ventilation without difficulty Laryngoscope Size: Mac and 3 Grade View: Grade I Tube type: Oral Tube size: 7.5 mm Number of attempts: 1 Airway Equipment and Method: Stylet Placement Confirmation: ETT inserted through vocal cords under direct vision,  positive ETCO2 and breath sounds checked- equal and bilateral Secured at: 22 cm Tube secured with: Tape Dental Injury: Teeth and Oropharynx as per pre-operative assessment

## 2017-04-10 NOTE — Op Note (Addendum)
Patient Name:           [name]   Date of Surgery:        04/10/2017  Pre op Diagnosis:      Ventral incisional hernia  Post op Diagnosis:    Ventral incisional hernia  Procedure:                 Open repair of ventral incisional hernia with UltraPro polypropylene mesh                                       Retrorectus repair  Surgeon:                     Edsel Petrin. Dalbert Batman, M.D., FACS  Assistant:                      Ralene Ok, M.D.   Indication for Assistant: Complex exposure.  Complex technique.  Surgeon assistant indicated to reduce the incidence of intraoperative and postoperative competitions  Operative Indications:   . This is a very pleasant 71 year old gentleman who is brought to the operating room for elective repair of ventral incisional hernia.      The hernia is getting larger. It is uncomfortable. Never incarcerated. He hates the abdominal binder. It is very inconvenient. He has seen Dr. Peter [name] who states that that he is acceptable risk. Dr. Caryl [name] is his electrophysiologist. Dr. Michail [name] is his gastroenterologist. [name] is his PCP at [facility]. The patient takes Coumadin, and the Lovenox bridge is being managed by Dr. Doug [name] Coumadin clinic staff.      He presented with a duodenal ulcer in April 2017 and Dr. Donne [name] took him to the operating room for patch closure. Said to be a tenuous closure. Skin packed open. Did heal. On the ventilator for 2 or 3 days. He did recover. Follow-up endoscopy by Dr. Michail [name] on August 31, 2016 showed some gastritis and a polyp but no active ulcer. He is doing well from a GI standpoint.     Significant comorbidities include systolic congestive heart failure, ischemic cardiomyopathy with ejection fraction 20-23%. Myocardial infarction year 2000. Pacemaker and AICD in place for atrial fibrillation. Has had endocardial ablation on January 25, 2017. This was AV node ablation. He  did well with that. He remains on Coumadin. History of C. difficile colitis. TURP. CKD3. Hypertension. History of DVT lower extremity.      We had a long talk. I told him that he had a incisional hernia and also had diastases recti. I told him that the diastases recti may persist and he may have a bulge but the hernia could be repaired. We will plan an open technique with a retrorectus repair, possible TAR release. Implant mesh.  he will be scheduled for open repair of ventral incisional hernia with mesh. . He agrees with this plan.      Operative Findings:       The patient's ventral hernia defect was probably 15 cm vertically, multiple defects,, and about 5-6 cm transversely.  There were omental adhesions and transverse colon adhesions which were soft and taken down without a problem.  There was no evidence of intra-abdominal inflammatory or obstructive disease.  I was able to perform a retrorectus repair.  I used a 16 cm transversely by 22 cm vertically piece of ultra Pro mesh which  was placed between the posterior rectus sheath and the rectus muscle.  The anterior sheath was closed in the midline to cover the mesh.  Procedure in Detail:          Following the induction of general endotracheal anesthesia a Foley catheter was placed.  Surgical timeout was performed.  Intravenous vancomycin was given.  The lower chest and entire abdomen were prepped and draped in a sterile fashion.      I made a midline skin incision from the xiphoid to the umbilicus, excising the old scar.  Dissection was carried down to 70s tissue.  I atraumatically entered the hernia sac.  I debrided 2 or 3 separate hernia sacs.  I took down all of the omental adhesions in dissected the transverse colon away from the fascia without any difficulty.  I took down the falciform ligament.  Was then able to have complete exposure of the posterior rectus sheath and peritoneum in all directions.      On both the right and the left  side I made a vertical incision in the posterior rectus sheath and dissected the rectus muscle away from the posterior rectus sheath extensively.  Perforators were preserved.  Superiorly and inferiorly I joined the 2 dissections.  There was no significant intra-abdominal bleeding.  I closed the posterior rectus sheath with a running suture of #1 PDS.  The wound was irrigated.  I then brought a large sheet of UltraPro mesh to the operative field.  I carefully cut the mesh in an elliptical shape about 22 cm vertically by about 16 cm transversely.  10 suture fixation sites were planned.  #1 Novafil was used.  At each of the suture fixation sites I made a small puncture wound in the skin.  I then placed the Novafil sutures in the mesh and tied them down and then passed them through the suture fixation skin incisions using the Endo Close device, being careful to take a good bite of tissue.  I did this one at a time to make sure the mesh spread out nicely.  I put 3 sutures laterally on right and left.  2 sutures at the apex of superiorly and in inferiorly.  After all of the sutures were placed I lifted them all up in the mesh deployed nicely with very little redundancy but I was able to tie down without any tension.  All the sutures were tied and the knots retracted into the suture fixation sites in the skin.  The wound was irrigated.  The patient is on Coumadin and so a placed a 69 [place] Blake drain in the space anterior to the mesh and brought it out through a separate stab incision in the right upper quadrant.  Sutured to the skin and connected to suction bulb.      We irrigated one more time.  Checked for bleeding.  Saw almost no bleeding.  The anterior rectus sheath was closed over the mesh with running suture of #1 PDS.  The midline skin incision was closed with skin staples.  Dermabond was placed on all of the suture fixation sites.  4 x 4's, ABDs, and a breast binder was placed.  Patient tolerated the procedure  well.  After TAPP blocks were placed by anesthesia he was taken to the recovery room in stable condition.  EBL 50-75 mL.  Counts correct.  Complications none.     Edsel Petrin. Dalbert Batman, M.D., FACS General and Minimally Invasive Surgery Breast and Colorectal Surgery  04/10/2017  3:53 PM

## 2017-04-11 ENCOUNTER — Encounter (HOSPITAL_COMMUNITY): Payer: Self-pay | Admitting: General Surgery

## 2017-04-11 DIAGNOSIS — I5022 Chronic systolic (congestive) heart failure: Secondary | ICD-10-CM

## 2017-04-11 DIAGNOSIS — Z95810 Presence of automatic (implantable) cardiac defibrillator: Secondary | ICD-10-CM

## 2017-04-11 DIAGNOSIS — K432 Incisional hernia without obstruction or gangrene: Principal | ICD-10-CM

## 2017-04-11 LAB — CBC
HEMATOCRIT: 37.2 % — AB (ref 39.0–52.0)
HEMOGLOBIN: 11.8 g/dL — AB (ref 13.0–17.0)
MCH: 27.1 pg (ref 26.0–34.0)
MCHC: 31.7 g/dL (ref 30.0–36.0)
MCV: 85.3 fL (ref 78.0–100.0)
Platelets: 197 10*3/uL (ref 150–400)
RBC: 4.36 MIL/uL (ref 4.22–5.81)
RDW: 14.4 % (ref 11.5–15.5)
WBC: 11.9 10*3/uL — ABNORMAL HIGH (ref 4.0–10.5)

## 2017-04-11 LAB — BASIC METABOLIC PANEL
Anion gap: 7 (ref 5–15)
BUN: 20 mg/dL (ref 6–20)
CALCIUM: 9.3 mg/dL (ref 8.9–10.3)
CHLORIDE: 111 mmol/L (ref 101–111)
CO2: 28 mmol/L (ref 22–32)
CREATININE: 1.33 mg/dL — AB (ref 0.61–1.24)
GFR, EST NON AFRICAN AMERICAN: 53 mL/min — AB (ref >60)
Glucose, Bld: 124 mg/dL — ABNORMAL HIGH (ref 65–99)
Potassium: 3.8 mmol/L (ref 3.5–5.1)
SODIUM: 146 mmol/L — AB (ref 135–145)

## 2017-04-11 LAB — PROTIME-INR
INR: 1.11
Prothrombin Time: 14.4 seconds (ref 11.4–15.2)

## 2017-04-11 MED ORDER — WARFARIN - PHARMACIST DOSING INPATIENT: Freq: Every day | Status: DC

## 2017-04-11 MED ORDER — WARFARIN SODIUM 4 MG PO TABS
4.0000 mg | ORAL_TABLET | Freq: Once | ORAL | Status: AC
  Administered 2017-04-11: 4 mg via ORAL
  Filled 2017-04-11: qty 1

## 2017-04-11 MED ORDER — ENOXAPARIN SODIUM 100 MG/ML ~~LOC~~ SOLN
100.0000 mg | Freq: Two times a day (BID) | SUBCUTANEOUS | Status: DC
  Administered 2017-04-11 – 2017-04-13 (×5): 100 mg via SUBCUTANEOUS
  Filled 2017-04-11 (×5): qty 1

## 2017-04-11 NOTE — Care Management Note (Signed)
Case Management Note  Patient Details  Name: [name] MRN: [id number] Date of Birth: 17-Mar-1946  Subjective/Objective:                    Action/Plan:  Open repair ventral incisional hernia  Will continue to follow for discharge needs Expected Discharge Date:                  Expected Discharge Plan:  Home/Self Care  In-House Referral:     Discharge planning Services     Post Acute Care Choice:    Choice offered to:     DME Arranged:    DME Agency:     HH Arranged:    HH Agency:     Status of Service:  In process, will continue to follow  If discussed at Long Length of Stay Meetings, dates discussed:    Additional Comments:  Marilu Favre, RN 04/11/2017, 10:49 AM

## 2017-04-11 NOTE — Anesthesia Postprocedure Evaluation (Signed)
Anesthesia Post Note  Patient: SAVIO ALBRECHT  Procedure(s) Performed: Procedure(s) (LRB): OPEN REPAIR INCISIONAL VENTRAL HERNIA (N/A) INSERTION OF MESH (N/A)  Patient location during evaluation: PACU Anesthesia Type: General and Regional Level of consciousness: sedated and patient cooperative Pain management: pain level controlled Vital Signs Assessment: post-procedure vital signs reviewed and stable Respiratory status: spontaneous breathing Cardiovascular status: stable Anesthetic complications: no       Last Vitals:  Vitals:   04/11/17 0648 04/11/17 1233  BP: (!) 117/54 (!) 132/59  Pulse: 70 70  Resp: 18   Temp: 37.2 C     Last Pain:  Vitals:   04/11/17 1233  TempSrc:   PainSc: Elizabeth

## 2017-04-11 NOTE — Evaluation (Signed)
Physical Therapy Evaluation Patient Details Name: [name] MRN: [id number] DOB: 03/20/1946 Today's Date: 04/11/2017   History of Present Illness  71 y.o. male admitted to [facility] on 04/10/17 for Open repair of incisional ventral hernia.  Pt with significant PMHx of SOB, s/p pacemaker, PAF, PVD, MI, ischemic cardiomyopathy, CAD, CHF, anemia, AICD placement, and R leg fx surgery.  Clinical Impression  Pt is guarded and weak, but moving better each time he gets up.  He admits to some anxiety and needs cues for pursed lip breathing, log roll and bracing for comfort.  I anticipate he can d/c home safely with his wife and would benefit from a short round of home therapy.   PT to follow acutely for deficits listed below.       Follow Up Recommendations Home health PT;Supervision for mobility/OOB    Equipment Recommendations  None recommended by PT    Recommendations for Other Services   NA    Precautions / Restrictions Precautions Precautions: Other (comment) Precaution Comments: abdomen Required Braces or Orthoses: Other Brace/Splint Other Brace/Splint: abdominal binder, JP drain Restrictions Weight Bearing Restrictions: No      Mobility  Bed Mobility Overal bed mobility: Needs Assistance Bed Mobility: Rolling;Sidelying to Sit Rolling: Min assist Sidelying to sit: Min assist       General bed mobility comments: Min assist to help pt roll to his side wiht verbal cues for log roll technique.  Pt was able to progress legs over EOB , but needed assist to boost trunk up to sitting.  Heavy reliance on bed rail for leverage.   Transfers Overall transfer level: Needs assistance   Transfers: Sit to/from Stand Sit to Stand: Min guard         General transfer comment: Min guard assist for safety.  Verbal cues for safe hand placment.   Ambulation/Gait Ambulation/Gait assistance: Min assist Ambulation Distance (Feet): 100 Feet Assistive device: Rolling walker (2 wheeled) Gait  Pattern/deviations: Step-through pattern;Shuffle Gait velocity: decreased Gait velocity interpretation: Below normal speed for age/gender General Gait Details: verbal cues for upright posture, prused lip breathing and to stand for a minute before walking to help decrease lightheadedness.  HR and O2 sats were stable on RA.                   Balance Overall balance assessment: Needs assistance Sitting-balance support: Feet supported;No upper extremity supported Sitting balance-[name] Scale: Good     Standing balance support: Bilateral upper extremity supported Standing balance-[name] Scale: Fair                               Pertinent Vitals/Pain Pain Assessment: 0-10 Pain Score: 7  Pain Location: abdomen Pain Descriptors / Indicators: Aching Pain Intervention(s): Limited activity within patient's tolerance;Monitored during session;Repositioned    Home Living Family/patient expects to be discharged to:: Private residence Living Arrangements: Spouse/significant other Available Help at Discharge: Family;Available 24 hours/day Type of Home: House Home Access: Stairs to enter Entrance Stairs-Rails: None Entrance Stairs-Number of Steps: 1 Home Layout: One level Home Equipment: Walker - 2 wheels      Prior Function Level of Independence: Independent                  Extremity/Trunk Assessment   Upper Extremity Assessment Upper Extremity Assessment: Defer to OT evaluation    Lower Extremity Assessment Lower Extremity Assessment: Generalized weakness    Cervical / Trunk Assessment Cervical /  Trunk Assessment: Normal  Communication   Communication: No difficulties  Cognition Arousal/Alertness: Awake/alert Behavior During Therapy: Anxious Overall Cognitive Status: Within Functional Limits for tasks assessed                                           Exercises Other Exercises Other Exercises: IS use x 10 reps with max inspired  volume 1750 mL   Assessment/Plan    PT Assessment Patient needs continued PT services  PT Problem List Decreased strength;Decreased activity tolerance;Decreased balance;Decreased mobility;Decreased knowledge of use of DME;Decreased knowledge of precautions;Pain       PT Treatment Interventions DME instruction;Stair training;Gait training;Functional mobility training;Therapeutic activities;Therapeutic exercise;Balance training;Patient/family education    PT Goals (Current goals can be found in the Care Plan section)  Acute Rehab PT Goals Patient Stated Goal: to travel with his wife PT Goal Formulation: With patient Time For Goal Achievement: 04/25/17 Potential to Achieve Goals: Good    Frequency Min 3X/week           End of Session Equipment Utilized During Treatment: Gait belt Activity Tolerance: Patient limited by pain Patient left: in chair;with call bell/phone within reach Nurse Communication: Mobility status PT Visit Diagnosis: Muscle weakness (generalized) (M62.81);Pain;Difficulty in walking, not elsewhere classified (R26.2) Pain - part of body:  (abdomen)    Time: 0277-4128 PT Time Calculation (min) (ACUTE ONLY): 55 min   Charges:   PT Evaluation $PT Eval Moderate Complexity: 1 Procedure PT Treatments $Gait Training: 8-22 mins $Therapeutic Exercise: 8-22 mins    Jennette Leask B. [place], [place], DPT [phone]   04/11/2017, 11:28 PM

## 2017-04-11 NOTE — Progress Notes (Signed)
1 Day Post-Op  Subjective: Stable and alert.  BP 117/54.  Heart rate 70 on pacemaker.  Afebrile.  SPO2 98% on room air Tolerating clear liquids.  No stool or flatus Good urine output.  Foley catheter recently discontinued Has not been out of bed. Complains of abdominal soreness Hemoglobin 11.8.  WBC 11,900.  Potassium 3.8.  Glucose 124.  Creatinine 1.33.  Objective: Vital signs in last 24 hours: Temp:  [97.8 F (36.6 C)-99 F (37.2 C)] 99 F (37.2 C) (04/24 0648) Pulse Rate:  [68-99] 70 (04/24 0648) Resp:  [13-20] 18 (04/24 0648) BP: (117-125)/(54-79) 117/54 (04/24 0648) SpO2:  [92 %-100 %] 98 % (04/24 0648) Weight:  [96.4 kg (212 lb 9.6 oz)] 96.4 kg (212 lb 9.6 oz) (04/23 1144)    Intake/Output from previous day: 04/23 0701 - 04/24 0700 In: 1933.3 [P.O.:240; I.V.:1693.3] Out: 1595 [Urine:1425; Drains:120; Blood:50] Intake/Output this shift: No intake/output data recorded.  General appearance: Alert.  Oriented.  Mild distress from abdominal pain.  Very talkative and friendly. Resp: Clear to auscultation anteriorly.  No wheezes or rhonchi. GI: Soft.  Appropriately tender.  Incision clean.  Minimal bowel sounds.  JP serosanguineous 120 mL out.  Lab Results:  Results for orders placed or performed during the hospital encounter of 04/10/17 (from the past 24 hour(s))  APTT     Status: None   Collection Time: 04/10/17 12:48 PM  Result Value Ref Range   aPTT 29 24 - 36 seconds  PT- INR Day of Surgery     Status: None   Collection Time: 04/10/17 12:48 PM  Result Value Ref Range   Prothrombin Time 14.2 11.4 - 15.2 seconds   INR 1.61   Basic metabolic panel     Status: Abnormal   Collection Time: 04/11/17  4:28 AM  Result Value Ref Range   Sodium 146 (H) 135 - 145 mmol/L   Potassium 3.8 3.5 - 5.1 mmol/L   Chloride 111 101 - 111 mmol/L   CO2 28 22 - 32 mmol/L   Glucose, Bld 124 (H) 65 - 99 mg/dL   BUN 20 6 - 20 mg/dL   Creatinine, Ser 1.33 (H) 0.61 - 1.24 mg/dL   Calcium  9.3 8.9 - 10.3 mg/dL   GFR calc non Af Amer 53 (L) >60 mL/min   GFR calc Af Amer >60 >60 mL/min   Anion gap 7 5 - 15  Protime-INR     Status: None   Collection Time: 04/11/17  4:28 AM  Result Value Ref Range   Prothrombin Time 14.4 11.4 - 15.2 seconds   INR 1.11   CBC     Status: Abnormal   Collection Time: 04/11/17  4:28 AM  Result Value Ref Range   WBC 11.9 (H) 4.0 - 10.5 K/uL   RBC 4.36 4.22 - 5.81 MIL/uL   Hemoglobin 11.8 (L) 13.0 - 17.0 g/dL   HCT 37.2 (L) 39.0 - 52.0 %   MCV 85.3 78.0 - 100.0 fL   MCH 27.1 26.0 - 34.0 pg   MCHC 31.7 30.0 - 36.0 g/dL   RDW 14.4 11.5 - 15.5 %   Platelets 197 150 - 400 K/uL     Studies/Results: No results found.  . digoxin  62.5 mcg Oral Q1200  . fenofibrate  160 mg Oral Daily  . ferrous sulfate  325 mg Oral BID  . folic acid  1 mg Oral Daily  . furosemide  40 mg Oral BID  . losartan  25 mg Oral  QHS  . metoprolol succinate  25 mg Oral Q1200  . multivitamin with minerals   Oral Daily  . pantoprazole  40 mg Oral BID  . potassium chloride SA  20 mEq Oral BID  . pravastatin  40 mg Oral QHS  . senna  1 tablet Oral BID  . sertraline  100 mg Oral Daily  . vitamin C  250 mg Oral BID     Assessment/Plan: s/p Procedure(s): OPEN REPAIR INCISIONAL VENTRAL HERNIA INSERTION OF MESH  POD #1.  Open repair ventral incisional hernia with mesh, retrorectus technique.   Stable  Full liquid diet until ileus resolved  Voiding trial today  Mobilize out of bed.  Physical therapy consult.   Lovenox bridge and Coumadin resumption per pharmacy consult  Cardiology to follow   Systolic CHF, chronic History atrial fibrillation with RVR Anticoagulated on Coumadin Presence of AICD CKDIII Ischemic cardiomyopathy History DVT lower extremity History TURP History C. difficile colitis History laparotomy for perforated duodenal ulcer  @PROBHOSP @  LOS: 1 day    Reyne Falconi M 04/11/2017  . .prob

## 2017-04-11 NOTE — Discharge Instructions (Signed)

## 2017-04-11 NOTE — Consult Note (Signed)
Cardiology Consult    Patient ID: [name] MRN: [id number], DOB/AGE: 09-01-1946   Admit date: 04/10/2017 Date of Consult: 04/11/2017  Primary Physician: Woody Seller, MD Reason for Consult: Anticoagulation Primary Cardiologist: Dr. Martinique Requesting Provider: Dr. Dalbert Batman   History of Present Illness    [name] is a 71 y.o. male with past medical history of atrial fibrillation (intolerant of Amiodarone, not a Tikosyn candidate due to prolonged QT, s/p ablation in 01/2017), CAD (s/p PCI to LAD in 2000), ischemic cardiomyopathy (s/p ICD placement with gen change in 72/6203), chronic systolic CHF (EF 55-97%), bilateral DVT (on Coumadin) and history of perforated duodenal ulcer with bleeding (in 02/2016) who is being seen today for the evaluation of anticoagulation at the request of Dr. Dalbert Batman.   He was last seen in the office by Dr. Martinique on 02/21/2017 and reported doing well at that time from a cardiac perspective. Was able to exert himself without developing significant dyspnea and he denied any recent chest pain or palpitations. He was continued on Coumadin and Lopressor for  his atrial fibrillation.    The office was contacted in regards to his upcoming surgery for incisional hernia repair and how to best manage his anticoagulation. He was followed by the Coumadin clinic for Lovenox bridging prior to admission and started this on 04/06/2017 with an anticipated procedure date of 4/23. In reviewing notes from the Coumadin Clinic, they recommended he resume Coumadin following the procedure if able to do so and resume Lovenox the following morning.   He presented to [facility] on 4/23 for the procedure and underwent open repair of the ventral incisional hernia by Dr. Dalbert Batman. No immediate complications were noted following the procedure. He is receiving Lovenox and is scheduled to receive his first dose of Coumadin tonight.   He denies any recent chest pain, palpitations, or dyspnea.  Reports soreness along his abdomen and the pain is exacerbated with coughing. He has been transitioned to a Full Liquid diet and is receiving LR at 75 mL/hr. He has been restarted on his PTA Digoxin, Lasix 40mg  BID, Losartan 25mg  daily, Toprol-XL 25mg  daily, and Pravastatin 40mg  daily.    Past Medical History   Past Medical History:  Diagnosis Date  . AICD (automatic cardioverter/defibrillator) present   . Anemia   . Anxiety   . CHF (congestive heart failure) ([place])   . Chronic renal insufficiency   . Coronary artery disease   . Depression   . Dyslipidemia   . Dysrhythmia    atrial fibrillation  . ED (erectile dysfunction)   . GERD (gastroesophageal reflux disease)   . Incisional hernia, without obstruction or gangrene 04/10/2017  . Ischemic cardiomyopathy    EF 23%  . MI (myocardial infarction) ([place]) 2000   ANTERIOR, s/p PCI  . Other primary cardiomyopathies   . Peripheral vascular disease ([place])    dvt legs 2017  . Persistent atrial fibrillation (HCC)    on coumadin  . Presence of permanent cardiac pacemaker   . Shortness of breath dyspnea    on exertion    Past Surgical History:  Procedure Laterality Date  . AV NODE ABLATION N/A 01/25/2017   Procedure: AV Node Ablation;  Surgeon: [name], MD;  Location: [facility] CV LAB;  Service: Cardiovascular;  Laterality: N/A;  . AV NODE ABLATION  01/25/2017  . BIV ICD GENERTAOR CHANGE OUT N/A 09/16/2013   Procedure: BIV ICD GENERTAOR CHANGE OUT;  Surgeon: [name], MD;  Location:  [facility] CATH LAB;  Service: Cardiovascular;  Laterality: N/A;  . BREAST LUMPECTOMY Left   . CARDIAC CATHETERIZATION  02/05/2008   MODERATE TO SEVERE LEFT VENTRICULAR  DYSFUNCTION WITH EF 25-30%  . CARDIAC DEFIBRILLATOR PLACEMENT  2009   MDT BI-VENTRICULAR ICD by Dr Caryl Comes  . COLONOSCOPY WITH PROPOFOL N/A 08/31/2016   Procedure: COLONOSCOPY WITH PROPOFOL;  Surgeon: Wilford Corner, MD;  Location[facility] ENDOSCOPY;  Service: Endoscopy;  Laterality: N/A;  .  CORONARY ANGIOPLASTY  2000   stent  . CORONARY STENT PLACEMENT     LAD  . CYSTOSCOPY N/A 01/25/2016   Procedure: CYSTOSCOPY;  Surgeon: Carolan Clines, MD;  Location: [facility] ORS;  Service: Urology;  Laterality: N/A;  . ESOPHAGOGASTRODUODENOSCOPY (EGD) WITH PROPOFOL N/A 08/31/2016   Procedure: ESOPHAGOGASTRODUODENOSCOPY (EGD) WITH PROPOFOL;  Surgeon: Wilford Corner, MD;  Location: [facility] ENDOSCOPY;  Service: Endoscopy;  Laterality: N/A;  . FRACTURE SURGERY Right 1972   leg  . IMPLANTABLE CARDIOVERTER DEFIBRILLATOR GENERATOR CHANGE  2014  . INCISIONAL HERNIA REPAIR N/A 04/10/2017   Procedure: OPEN REPAIR INCISIONAL VENTRAL HERNIA;  Surgeon: [name], MD;  Location: [facility];  Service: General;  Laterality: N/A;  . INSERT / REPLACE / REMOVE PACEMAKER     ICD  . INSERTION OF MESH N/A 04/10/2017   Procedure: INSERTION OF MESH;  Surgeon: [name], MD;  Location: [place];  Service: General;  Laterality: N/A;  . LAPAROTOMY N/A 02/22/2016   Procedure: EXPLORATORY LAPAROTOMY WITH  PATCH OF DUODENAL ULCER;  Surgeon: Rolm Bookbinder, MD;  Location: [place];  Service: General;  Laterality: N/A;  . MULTIPLE TOOTH EXTRACTIONS    . TRANSTHORACIC ECHOCARDIOGRAM  12/2010   EF 30-35%  . TRANSURETHRAL RESECTION OF PROSTATE N/A 01/25/2016   Procedure: TRANSURETHRAL RESECTION OF THE PROSTATE (TURP);  Surgeon: Carolan Clines, MD;  Location: [facility] ORS;  Service: Urology;  Laterality: N/A;  . WRIST FRACTURE SURGERY Left 1972     Allergies  Allergies  Allergen Reactions  . Keflex [Cephalexin] Shortness Of Breath and Other (See Comments)    dizziness    Inpatient Medications    . digoxin  62.5 mcg Oral Q1200  . enoxaparin (LOVENOX) injection  100 mg Subcutaneous Q12H  . fenofibrate  160 mg Oral Daily  . ferrous sulfate  325 mg Oral BID  . folic acid  1 mg Oral Daily  . furosemide  40 mg Oral BID  . losartan  25 mg Oral QHS  . metoprolol succinate  25 mg Oral Q1200  . multivitamin with minerals   Oral  Daily  . pantoprazole  40 mg Oral BID  . potassium chloride SA  20 mEq Oral BID  . pravastatin  40 mg Oral QHS  . senna  1 tablet Oral BID  . sertraline  100 mg Oral Daily  . vitamin C  250 mg Oral BID  . warfarin  4 mg Oral ONCE-1800  . Warfarin - Pharmacist Dosing Inpatient   Does not apply q1800    Family History    Family History  Problem Relation Age of Onset  . Breast cancer Mother   . Depression Mother   . Heart attack Father   . Breast cancer Sister     Social History    Social History   Social History  . Marital status: Married    Spouse name: N/A  . Number of children: 2  . Years of education: N/A   Occupational History  . disabled Retired   Social History Main Topics  .  Smoking status: Former Smoker    Types: Cigarettes    Quit date: 12/23/1998  . Smokeless tobacco: Never Used  . Alcohol use No  . Drug use: No  . Sexual activity: Not on file   Other Topics Concern  . Not on file   Social History Narrative   Lives in [place].  Retired Teacher, English as a foreign language           Review of Systems    General:  No chills, fever, night sweats or weight changes.  Cardiovascular:  No chest pain, dyspnea on exertion, edema, orthopnea, palpitations, paroxysmal nocturnal dyspnea. Dermatological: No rash, lesions/masses Respiratory: No cough, dyspnea Urologic: No hematuria, dysuria Abdominal:   No nausea, vomiting, diarrhea, bright red blood per rectum, melena, or hematemesis. Positive for abdominal pain.  Neurologic:  No visual changes, wkns, changes in mental status. All other systems reviewed and are otherwise negative except as noted above.  Physical Exam    Blood pressure (!) 132/59, pulse 70, temperature 99 F (37.2 C), temperature source Oral, resp. rate 18, height 5\' 10"  (1.778 m), weight 212 lb 9.6 oz (96.4 kg), SpO2 98 %.  General: Pleasant, Caucasian male appearing in NAD Psych: Normal affect. Neuro: Alert and oriented X 3. Moves all extremities  spontaneously. HEENT: Normal  Neck: Supple without bruits or JVD. Lungs:  Resp regular and unlabored, CTA without wheezing or rales. Heart: RRR no s3, s4, or murmurs. Abdomen: Soft, non-distended, BS + x 4. Abdominal binder in place.  Extremities: No clubbing, cyanosis or edema. DP/PT/Radials 2+ and equal bilaterally.  Labs    Troponin (Point of Care Test) No results for input(s): TROPIPOC in the last 72 hours. No results for input(s): CKTOTAL, CKMB, TROPONINI in the last 72 hours. Lab Results  Component Value Date   WBC 11.9 (H) 04/11/2017   HGB 11.8 (L) 04/11/2017   HCT 37.2 (L) 04/11/2017   MCV 85.3 04/11/2017   PLT 197 04/11/2017    Recent Labs Lab 04/11/17 0428  NA 146*  K 3.8  CL 111  CO2 28  BUN 20  CREATININE 1.33*  CALCIUM 9.3  GLUCOSE 124*   Lab Results  Component Value Date   CHOL 112 02/27/2016   HDL 32 (L) 02/27/2016   LDLCALC 66 02/27/2016   TRIG 53 02/29/2016   Lab Results  Component Value Date   DDIMER 1.60 (H) 02/22/2016     Radiology Studies    No results found.  EKG & Cardiac Imaging    EKG:  01/03/2017: V-paced, HR 88. - Personally Reviewed  Echocardiogram: 12/2015  Study Conclusions  - Left ventricle: The cavity size was severely dilated. Wall   thickness was normal. Systolic function was severely reduced. The   estimated ejection fraction was in the range of 20% to 25%.   Dyskinesis of the apical myocardium. Akinesis and scarring of the   entireanteroseptal, anterior, and anterolateral myocardium.   Doppler parameters are consistent with restrictive physiology,   indicative of decreased left ventricular diastolic compliance   and/or increased left atrial pressure. No evidence of thrombus.   Acoustic contrast opacification revealed no evidence ofthrombus.   Acoustic contrast opacification revealed no evidence ofthrombus. - Ventricular septum: Septal motion showed paradox. These changes   are consistent with a left bundle branch  block. - Mitral valve: Calcified annulus. There was mild to moderate   regurgitation. - Left atrium: The atrium was moderately dilated. - Right atrium: The atrium was mildly dilated. - Atrial septum: No defect or patent  foramen ovale was identified. - Pulmonary arteries: PA peak pressure: 32 mm Hg (S).  Assessment & Plan    1. Paroxysmal Atrial Fibrillation - has a known history of atrial fibrillation intolerant of Amiodarone and not a Tikosyn candidate due to prolonged QT. He underwent ablation in 01/2017.  - has been V-paced with HR in the 80's on telemetry since his procedure.  - This patients CHA2DS2-VASc Score and unadjusted Ischemic Stroke Rate (% per year) is equal to 9.7 % stroke rate/year from a score of 6 (CHF, HTN, Vascular, Age, TE (2)). Plans are to restart Coumadin tonight. Continue with Lovenox bridge until INR > 2.0. - continue Toprol-XL and Digoxin.   2. Ischemic cardiomyopathy/ Chronic Systolic CHF - EF at 84-66% by echo in 12/2015, s/p ICD placement with gen change in 08/2013. - he does not appear volume overloaded on physical examination.  - currently receiving IVF along with PTA PO Lasix 40mg  BID. He has good PO fluid intake at this time. Will reduce IVF rate to 50 mL/hr and continue to monitor fluid status closely. May need to reduce or hold Lasix dosing pending oral intake.  - continue BB and ARB.   3. CAD  - s/p PCI to LAD in 2000.  - he denies any recent anginal symptoms. - continue BB and statin. No ASA secondary to need for Coumadin.   4. History of Bilateral DVT's - Coumadin with Lovenox bridge as above.   5. Open repair of the ventral incisional hernia  - per Surgery.   Signed, Erma Heritage, PA-C 04/11/2017, 1:45 PM Pager: [phone] Patient seen and examined and history reviewed. Agree with above findings and plan. [name] is well known to me. Prior cardiac history as outlined above. Admitted for surgical repair of a ventral hernia performed  yesterday. Patient was on bridging lovenox prior to surgery and coumadin held. On evaluation today he has no cardiac complaints. Denies chest pain or dyspnea. He complains of surgical incisional pain worse with coughing or deep breathing. Lovenox resumed today.  On exam he is in no distress Lungs are clear. CV RRR without gallop or murmur No edema  Labs reviewed  Impression: [name] is stable from a cardiac standpoint post op ventral hernia repair. Agree with anticoagulation plans with bridging lovenox per pharmacy and resumption of coumadin. All other cardiac meds will be resumed as he was taking pre op. Will follow.   Peter Martinique, [place] 04/11/2017 5:37 PM

## 2017-04-11 NOTE — Progress Notes (Signed)
ANTICOAGULATION CONSULT NOTE - Initial Consult  Pharmacy Consult for heparin > warfarin Indication: atrial fibrillation  Allergies  Allergen Reactions  . Keflex [Cephalexin] Shortness Of Breath and Other (See Comments)    dizziness    Patient Measurements: Height: 5\' 10"  (177.8 cm) Weight: 212 lb 9.6 oz (96.4 kg) IBW/kg (Calculated) : 73  Vital Signs: Temp: 99 F (37.2 C) (04/24 0648) Temp Source: Oral (04/24 0648) BP: 117/54 (04/24 0648) Pulse Rate: 70 (04/24 0648)  Labs:  Recent Labs  04/10/17 1248 04/11/17 0428  HGB  --  11.8*  HCT  --  37.2*  PLT  --  197  APTT 29  --   LABPROT 14.2 14.4  INR 1.09 1.11  CREATININE  --  1.33*    Estimated Creatinine Clearance: 61.1 mL/min (A) (by C-G formula based on SCr of 1.33 mg/dL (H)).   Medical History: Past Medical History:  Diagnosis Date  . AICD (automatic cardioverter/defibrillator) present   . Anemia   . Anxiety   . CHF (congestive heart failure) ([place])   . Chronic renal insufficiency   . Coronary artery disease   . Depression   . Dyslipidemia   . Dysrhythmia    atrial fibrillation  . ED (erectile dysfunction)   . GERD (gastroesophageal reflux disease)   . Incisional hernia, without obstruction or gangrene 04/10/2017  . Ischemic cardiomyopathy    EF 23%  . MI (myocardial infarction) ([place]) 2000   ANTERIOR, s/p PCI  . Other primary cardiomyopathies   . Peripheral vascular disease ([place])    dvt legs 2017  . Persistent atrial fibrillation (HCC)    on coumadin  . Presence of permanent cardiac pacemaker   . Shortness of breath dyspnea    on exertion     Assessment: Pt admitted with incisional hernia, he is POD #1 s/p repair of the ventral hernia. He is on warfarin prior to admission for hx of AFib. He was bridged peri-operatively with Lovenox, his last dose of warfarin was on 4/17. Current INR 1.1. hgb 11.8, plts wnl.  PTA warfarin: 2.5 mg po daily  Goal of Therapy:  INR 2-3 Monitor platelets by  anticoagulation protocol: Yes    Plan:  -Lovenox 100 mg [place] q12h -Warfarin 4 mg po x1 -Daily INR, CBC q72h -Stop Lovenox when INR > 2   Hughes Better M 04/11/2017,11:16 AM

## 2017-04-12 ENCOUNTER — Other Ambulatory Visit: Payer: Self-pay | Admitting: Internal Medicine

## 2017-04-12 DIAGNOSIS — I481 Persistent atrial fibrillation: Secondary | ICD-10-CM

## 2017-04-12 DIAGNOSIS — I5022 Chronic systolic (congestive) heart failure: Secondary | ICD-10-CM

## 2017-04-12 LAB — CBC
HCT: 38.7 % — ABNORMAL LOW (ref 39.0–52.0)
Hemoglobin: 12.4 g/dL — ABNORMAL LOW (ref 13.0–17.0)
MCH: 27.7 pg (ref 26.0–34.0)
MCHC: 32 g/dL (ref 30.0–36.0)
MCV: 86.4 fL (ref 78.0–100.0)
PLATELETS: 189 10*3/uL (ref 150–400)
RBC: 4.48 MIL/uL (ref 4.22–5.81)
RDW: 14.7 % (ref 11.5–15.5)
WBC: 9.7 10*3/uL (ref 4.0–10.5)

## 2017-04-12 LAB — PROTIME-INR
INR: 1.13
PROTHROMBIN TIME: 14.6 s (ref 11.4–15.2)

## 2017-04-12 MED ORDER — HYDROMORPHONE HCL 1 MG/ML IJ SOLN: 0.5000 mg | INTRAMUSCULAR | Status: DC | PRN

## 2017-04-12 MED ORDER — BISACODYL 10 MG RE SUPP
10.0000 mg | Freq: Two times a day (BID) | RECTAL | Status: AC
  Administered 2017-04-12 (×2): 10 mg via RECTAL
  Filled 2017-04-12 (×2): qty 1

## 2017-04-12 MED ORDER — WARFARIN SODIUM 5 MG PO TABS
5.0000 mg | ORAL_TABLET | Freq: Once | ORAL | Status: AC
  Administered 2017-04-12: 5 mg via ORAL
  Filled 2017-04-12: qty 1

## 2017-04-12 NOTE — Progress Notes (Signed)
Progress Note  [name] Date of Encounter: 04/12/2017  Primary Cardiologist: Lillis Nuttle Martinique MD  Subjective   Feels better today. Less surgical pain. No chest pain or SOB. Ambulating well.   Inpatient Medications    Scheduled Meds: . bisacodyl  10 mg Rectal BID  . digoxin  62.5 mcg Oral Q1200  . enoxaparin (LOVENOX) injection  100 mg Subcutaneous Q12H  . fenofibrate  160 mg Oral Daily  . ferrous sulfate  325 mg Oral BID  . folic acid  1 mg Oral Daily  . furosemide  40 mg Oral BID  . losartan  25 mg Oral QHS  . metoprolol succinate  25 mg Oral Q1200  . multivitamin with minerals   Oral Daily  . pantoprazole  40 mg Oral BID  . potassium chloride SA  20 mEq Oral BID  . pravastatin  40 mg Oral QHS  . sertraline  100 mg Oral Daily  . vitamin C  250 mg Oral BID  . warfarin  5 mg Oral ONCE-1800  . Warfarin - Pharmacist Dosing Inpatient   Does not apply q1800   Continuous Infusions: . lactated ringers 50 mL/hr at 04/11/17 1452   PRN Meds: acetaminophen, HYDROcodone-acetaminophen, HYDROmorphone (DILAUDID) injection, methocarbamol, nitroGLYCERIN, ondansetron **OR** ondansetron (ZOFRAN) IV   Vital Signs    Vitals:   04/11/17 1434 04/11/17 2026 04/12/17 0506 04/12/17 1318  BP: (!) 126/59 139/63 121/61 125/60  Pulse: 71 71 71 73  Resp: 17 19 19 18   Temp: 98.6 F (37 C) 98.3 F (36.8 C) 98.1 F (36.7 C) 98.6 F (37 C)  TempSrc: Oral Oral Oral Oral  SpO2: 98% 98% 98% 95%  Weight:      Height:        Intake/Output Summary (Last 24 hours) at 04/12/17 1344 Last data filed at 04/12/17 1320  Gross per 24 hour  Intake             1165 ml  Output             2940 ml  Net            -1775 ml   Filed Weights   04/10/17 1144  Weight: 212 lb 9.6 oz (96.4 kg)    Telemetry    Afib with paced ventricular rhythm. Rare PVC- Personally Reviewed  ECG    none  Physical Exam   GEN: No acute distress.   Neck: No JVD Cardiac: RRR, no murmurs, rubs, or  gallops.  Respiratory: Clear to auscultation bilaterally. GI: surgical dressing in place. MS: No edema; No deformity. Neuro:  Nonfocal  Psych: Normal affect   Labs    Chemistry Recent Labs Lab 04/11/17 0428  NA 146*  K 3.8  CL 111  CO2 28  GLUCOSE 124*  BUN 20  CREATININE 1.33*  CALCIUM 9.3  GFRNONAA 53*  GFRAA >60  ANIONGAP 7     Hematology Recent Labs Lab 04/11/17 0428 04/12/17 0642  WBC 11.9* 9.7  RBC 4.36 4.48  HGB 11.8* 12.4*  HCT 37.2* 38.7*  MCV 85.3 86.4  MCH 27.1 27.7  MCHC 31.7 32.0  RDW 14.4 14.7  PLT 197 189    Cardiac EnzymesNo results for input(s): TROPONINI in the last 168 hours. No results for input(s): TROPIPOC in the last 168 hours.   BNPNo results for input(s): BNP, PROBNP in the last 168 hours.   DDimer No results for input(s): DDIMER in the last 168 hours.   Radiology  No results found.  Cardiac Studies   none  Patient Profile     71 y.o. male with past medical history of atrial fibrillation (intolerant of Amiodarone, not a Tikosyn candidate due to prolonged QT, s/p ablation in 01/2017), CAD (s/p PCI to LAD in 2000), ischemic cardiomyopathy (s/p ICD placement with gen change in 37/2902), chronic systolic CHF (EF 11-15%), bilateral DVT (on Coumadin) and history of perforated duodenal ulcer with bleeding (in 02/2016) who is s/p ventral hernia repair on 04/10/17 by Dr. Dalbert Batman.   Assessment & Plan    1. Persistent Atrial Fibrillation - has a known history of atrial fibrillation intolerant of Amiodarone and not a Tikosyn candidate due to prolonged QT. He underwent AV node ablation in 01/2017.  - has been V-paced with HR in the 80's on telemetry since his procedure.  - This patients CHA2DS2-VASc Score and unadjusted Ischemic Stroke Rate (% per year) is equal to 9.7 % stroke rate/year from a score of 6 (CHF, HTN, Vascular, Age, TE (2)). Coumadin resumed post op.  Continue with Lovenox bridge until INR > 2.0. INR 1.13 today. - continue  Toprol-XL and Digoxin.   2. Ischemic cardiomyopathy/ Chronic Systolic CHF - EF at 52-08% by echo in 12/2015, s/p ICD placement with gen change in 08/2013. - he does not appear volume overloaded on physical examination.  - currently receiving IVF along with PTA PO Lasix 40mg  BID. He has good PO fluid intake at this time. Will reduce IVF rate to 50 mL/hr and continue to monitor fluid status closely. OK to stop IVF when OK with surgery.  - continue BB and ARB.   3. CAD  - s/p PCI to LAD in 2000.  - he denies any recent anginal symptoms. - continue BB and statin. No ASA secondary to need for Coumadin.   4. History of Bilateral DVT's - Coumadin with Lovenox bridge as above.   5. Open repair of the ventral incisional hernia  - per Surgery. Making good progress   Signed, Mahmoud Blazejewski Martinique, MD  04/12/2017, 1:44 PM

## 2017-04-12 NOTE — Progress Notes (Signed)
2 Days Post-Op  Subjective: Stable and alert.  No chest pain or dyspnea. Occasional nausea but no vomiting No stool or flatus Voiding without difficulty Ambulated a little with assistance yesterday Started back on Lovenox and Coumadin yesterday. I appreciate Dr. Doug Sou supervision of cardiac issues.  Objective: Vital signs in last 24 hours: Temp:  [98.1 F (36.7 C)-98.6 F (37 C)] 98.1 F (36.7 C) (04/25 0506) Pulse Rate:  [70-71] 71 (04/25 0506) Resp:  [17-19] 19 (04/25 0506) BP: (121-139)/(59-63) 121/61 (04/25 0506) SpO2:  [98 %] 98 % (04/25 0506) Last BM Date: 04/10/17  Intake/Output from previous day: 04/24 0701 - 04/25 0700 In: 1165 [I.V.:1165] Out: 1920 [Urine:1850; Drains:70] Intake/Output this shift: No intake/output data recorded.  General appearance: Alert and cooperative.  Slightly fatigued and slightly deconditioned.  No significant distress. Resp: Clear to auscultation bilaterally on anterior exam GI: Abdomen is soft.  Not distended.  Minimally tender.  Fairly silent.  Midline wound intact and clean with some ecchymoses.  JP drainage thin and slowing down.  70 mL yesterday.  Lab Results:  No results found for this or any previous visit (from the past 24 hour(s)).   Studies/Results: No results found.  . bisacodyl  10 mg Rectal BID  . digoxin  62.5 mcg Oral Q1200  . enoxaparin (LOVENOX) injection  100 mg Subcutaneous Q12H  . fenofibrate  160 mg Oral Daily  . ferrous sulfate  325 mg Oral BID  . folic acid  1 mg Oral Daily  . furosemide  40 mg Oral BID  . losartan  25 mg Oral QHS  . metoprolol succinate  25 mg Oral Q1200  . multivitamin with minerals   Oral Daily  . pantoprazole  40 mg Oral BID  . potassium chloride SA  20 mEq Oral BID  . pravastatin  40 mg Oral QHS  . sertraline  100 mg Oral Daily  . vitamin C  250 mg Oral BID  . Warfarin - Pharmacist Dosing Inpatient   Does not apply q1800     Assessment/Plan: s/p Procedure(s): OPEN REPAIR  INCISIONAL VENTRAL HERNIA INSERTION OF MESH   POD #2.  Open repair ventral incisional hernia with mesh, retrorectus technique---04/10/2017 Stable  Discontinue drain prior to discharge. Full liquid diet until ileus resolved  Reduce narcotics a bit. Try Dulcolax suppository  IVs at 50 mL/h Mobilize out of bed.  Physical therapy consult.   Lovenox bridge and Coumadin resumption per pharmacy consult  Labs tomorrow Cardiology to follow   Systolic CHF, chronic History atrial fibrillation with RVR Anticoagulated on Coumadin Presence of AICD CKDIII Ischemic cardiomyopathy History DVT lower extremity History TURP History C. difficile colitis History laparotomy for perforated duodenal ulcer  @PROBHOSP @  LOS: 2 days    Medina Degraffenreid M 04/12/2017  . .prob

## 2017-04-12 NOTE — Progress Notes (Signed)
[place] for Lovenox + warfarin Indication: atrial fibrillation  Allergies  Allergen Reactions  . Keflex [Cephalexin] Shortness Of Breath and Other (See Comments)    dizziness    Patient Measurements: Height: 5\' 10"  (177.8 cm) Weight: 212 lb 9.6 oz (96.4 kg) IBW/kg (Calculated) : 73  Vital Signs: Temp: 98.1 F (36.7 C) (04/25 0506) Temp Source: Oral (04/25 0506) BP: 121/61 (04/25 0506) Pulse Rate: 71 (04/25 0506)  Labs:  Recent Labs  04/10/17 1248 04/11/17 0428 04/12/17 0642  HGB  --  11.8* 12.4*  HCT  --  37.2* 38.7*  PLT  --  197 189  APTT 29  --   --   LABPROT 14.2 14.4 14.6  INR 1.09 1.11 1.13  CREATININE  --  1.33*  --     Estimated Creatinine Clearance: 61.1 mL/min (A) (by C-G formula based on SCr of 1.33 mg/dL (H)).   Assessment: Pt admitted with incisional hernia, he is POD #2 s/p repair of the ventral hernia. He was on warfarin prior to admission for hx of AFib. He was bridged peri-operatively with Lovenox-last dose of warfarin was on 4/17.   Now bridging with Lovenox back to therapeutic warfarin. Current INR 1.3.  Hgb 12.4, plts 189- no bleeding noted. SCr 1.33, CrCl ~60-66mL/min.  PTA warfarin: 2.5 mg po daily  Goal of Therapy:  INR 2-3 Monitor platelets by anticoagulation protocol: Yes    Plan:  -Lovenox 100mg  subQ q12h -Warfarin 5mg  po x1 -Daily INR, CBC q72h -Stop Lovenox when INR > 2  Dangela How D. Tiarna Koppen, PharmD, BCPS Clinical Pharmacist Pager: [phone] 04/12/2017 8:16 AM

## 2017-04-13 ENCOUNTER — Encounter (HOSPITAL_COMMUNITY): Payer: Self-pay | Admitting: General Practice

## 2017-04-13 ENCOUNTER — Ambulatory Visit (INDEPENDENT_AMBULATORY_CARE_PROVIDER_SITE_OTHER): Payer: Medicare Other

## 2017-04-13 DIAGNOSIS — I5022 Chronic systolic (congestive) heart failure: Secondary | ICD-10-CM | POA: Diagnosis not present

## 2017-04-13 DIAGNOSIS — Z95810 Presence of automatic (implantable) cardiac defibrillator: Secondary | ICD-10-CM

## 2017-04-13 LAB — CBC
HEMATOCRIT: 40.9 % (ref 39.0–52.0)
Hemoglobin: 13.1 g/dL (ref 13.0–17.0)
MCH: 27.7 pg (ref 26.0–34.0)
MCHC: 32 g/dL (ref 30.0–36.0)
MCV: 86.5 fL (ref 78.0–100.0)
Platelets: 216 10*3/uL (ref 150–400)
RBC: 4.73 MIL/uL (ref 4.22–5.81)
RDW: 14.5 % (ref 11.5–15.5)
WBC: 9.9 10*3/uL (ref 4.0–10.5)

## 2017-04-13 LAB — BASIC METABOLIC PANEL
ANION GAP: 10 (ref 5–15)
BUN: 16 mg/dL (ref 6–20)
CALCIUM: 9.9 mg/dL (ref 8.9–10.3)
CO2: 32 mmol/L (ref 22–32)
Chloride: 99 mmol/L — ABNORMAL LOW (ref 101–111)
Creatinine, Ser: 1.43 mg/dL — ABNORMAL HIGH (ref 0.61–1.24)
GFR calc Af Amer: 56 mL/min — ABNORMAL LOW (ref >60)
GFR calc non Af Amer: 48 mL/min — ABNORMAL LOW (ref >60)
GLUCOSE: 108 mg/dL — AB (ref 65–99)
Potassium: 3.4 mmol/L — ABNORMAL LOW (ref 3.5–5.1)
Sodium: 141 mmol/L (ref 135–145)

## 2017-04-13 LAB — PROTIME-INR
INR: 1.36
Prothrombin Time: 16.9 seconds — ABNORMAL HIGH (ref 11.4–15.2)

## 2017-04-13 MED ORDER — TRAMADOL HCL 50 MG PO TABS: 100.0000 mg | ORAL_TABLET | Freq: Two times a day (BID) | ORAL | Status: DC | PRN

## 2017-04-13 MED ORDER — WARFARIN SODIUM 5 MG PO TABS
7.5000 mg | ORAL_TABLET | Freq: Once | ORAL | Status: AC
  Administered 2017-04-13: 7.5 mg via ORAL
  Filled 2017-04-13: qty 2

## 2017-04-13 MED ORDER — POTASSIUM CHLORIDE CRYS ER 20 MEQ PO TBCR: 20.0000 meq | EXTENDED_RELEASE_TABLET | Freq: Two times a day (BID) | ORAL | Status: DC

## 2017-04-13 MED ORDER — POLYETHYLENE GLYCOL 3350 17 G PO PACK
17.0000 g | PACK | Freq: Every day | ORAL | Status: AC
  Administered 2017-04-13: 17 g via ORAL
  Filled 2017-04-13: qty 1

## 2017-04-13 MED ORDER — POTASSIUM CHLORIDE CRYS ER 20 MEQ PO TBCR
40.0000 meq | EXTENDED_RELEASE_TABLET | Freq: Two times a day (BID) | ORAL | Status: AC
  Administered 2017-04-13 (×2): 40 meq via ORAL
  Filled 2017-04-13 (×2): qty 2

## 2017-04-13 NOTE — Progress Notes (Signed)
3 Days Post-Op  Subjective: Says he's feeling better. Ambulated in halls with walker yesterday and wants to do more Voiding without difficulty Thinks he is going to have a bowel movement today, but no stool or flatus yet Mild intermittent nausea We talked about transitioning from narcotic and nonnarcotic pain medication because of nausea and he is agreeable  Potassium 3.4.  Glucose 108.  Creatinine 1.43.  WBC 9900.  Hemoglobin 13.1.  Objective: Vital signs in last 24 hours: Temp:  [97.7 F (36.5 C)-98.6 F (37 C)] 98.2 F (36.8 C) (04/26 0521) Pulse Rate:  [69-73] 69 (04/26 0521) Resp:  [18-19] 19 (04/26 0521) BP: (125-144)/(60-67) 133/67 (04/26 0521) SpO2:  [95 %-96 %] 96 % (04/26 0521) Last BM Date: 04/10/17  Intake/Output from previous day: 04/25 0701 - 04/26 0700 In: 420 [P.O.:120; I.V.:300] Out: 2800 [Urine:2650; Drains:150] Intake/Output this shift: Total I/O In: 420 [P.O.:120; I.V.:300] Out: 895 [Urine:825; Drains:70]  General appearance: Alert.  Cooperative.  Friendly.  Minimal distress.  Mental status normal. Resp: Clear to auscultation anteriorly.  Cough slightly productive. GI: Soft.  Bowel sounds present.  Midline wound clean with some ecchymoses to the left.  A little bit of bleeding around the drain site but no hematoma.  Dressing changed.  Drainage serosanguineous, up to 150 mL.  Lab Results:  Results for orders placed or performed during the hospital encounter of 04/10/17 (from the past 24 hour(s))  Protime-INR     Status: Abnormal   Collection Time: 04/13/17  4:45 AM  Result Value Ref Range   Prothrombin Time 16.9 (H) 11.4 - 15.2 seconds   INR 1.36   CBC     Status: None   Collection Time: 04/13/17  4:45 AM  Result Value Ref Range   WBC 9.9 4.0 - 10.5 K/uL   RBC 4.73 4.22 - 5.81 MIL/uL   Hemoglobin 13.1 13.0 - 17.0 g/dL   HCT 40.9 39.0 - 52.0 %   MCV 86.5 78.0 - 100.0 fL   MCH 27.7 26.0 - 34.0 pg   MCHC 32.0 30.0 - 36.0 g/dL   RDW 14.5 11.5 - 15.5  %   Platelets 216 150 - 400 K/uL  Basic metabolic panel     Status: Abnormal   Collection Time: 04/13/17  4:45 AM  Result Value Ref Range   Sodium 141 135 - 145 mmol/L   Potassium 3.4 (L) 3.5 - 5.1 mmol/L   Chloride 99 (L) 101 - 111 mmol/L   CO2 32 22 - 32 mmol/L   Glucose, Bld 108 (H) 65 - 99 mg/dL   BUN 16 6 - 20 mg/dL   Creatinine, Ser 1.43 (H) 0.61 - 1.24 mg/dL   Calcium 9.9 8.9 - 10.3 mg/dL   GFR calc non Af Amer 48 (L) >60 mL/min   GFR calc Af Amer 56 (L) >60 mL/min   Anion gap 10 5 - 15     Studies/Results: No results found.  . digoxin  62.5 mcg Oral Q1200  . enoxaparin (LOVENOX) injection  100 mg Subcutaneous Q12H  . fenofibrate  160 mg Oral Daily  . folic acid  1 mg Oral Daily  . furosemide  40 mg Oral BID  . losartan  25 mg Oral QHS  . metoprolol succinate  25 mg Oral Q1200  . multivitamin with minerals   Oral Daily  . pantoprazole  40 mg Oral BID  . polyethylene glycol  17 g Oral Daily  . potassium chloride SA  20 mEq Oral BID  .  potassium chloride  40 mEq Oral BID  . pravastatin  40 mg Oral QHS  . sertraline  100 mg Oral Daily  . vitamin C  250 mg Oral BID  . Warfarin - Pharmacist Dosing Inpatient   Does not apply q1800     Assessment/Plan: s/p Procedure(s): OPEN REPAIR INCISIONAL VENTRAL HERNIA INSERTION OF MESH   POD #3.Open repair ventral incisional hernia with mesh, retrorectus technique---04/10/2017 Stable  Drain output likely due to anticoagulation.  Leave drain today Discontinue narcotic and substitute Tylenol and tramadol Full liquid diet, advance as tolerated MiraLAX today  IVs at [facility] out of bed more.  PT involved  Lovenox bridge and Coumadin resumption per pharmacy consult  Cardiology to follow   Mild hypokalemia.  May be contributing to ileus.  Supplement with oral potassium today.  Also on digoxin.  Systolic CHF, chronic History atrial fibrillation with RVR Anticoagulated on Coumadin Presence of AICD CKDIII Ischemic  cardiomyopathy History DVT lower extremity History TURP History C. difficile colitis History laparotomy for perforated duodenal ulcer  @PROBHOSP @  LOS: 3 days    Reghan Thul M 04/13/2017  . .prob

## 2017-04-13 NOTE — Progress Notes (Signed)
Physical Therapy Treatment Patient Details Name: [name] MRN: [id number] DOB: 29-Mar-1946 Today's Date: 04/13/2017    History of Present Illness 71 y.o. male [name] 04[facility]10[facility]17 for Open repair of incisional ventral hernia.  Pt with significant PMHx of SOB, s/p pacemaker, PAF, PVD, MI, ischemic cardiomyopathy, CAD, CHF, anemia, AICD placement, and R leg fx surgery.    PT Comments    Pt has met his goals and is supervision for transfers with RW and ambulation of 1050 feet with RW Pt does not require any further PT services at this time. Pt encouraged to keep up his walking program for exercise and to aid in recovery from his surgery.    Follow Up Recommendations  Supervision for mobility/OOB;No PT follow up     Equipment Recommendations  None recommended by PT       Precautions / Restrictions Precautions Precautions: Other (comment) Precaution Comments: abdomen Required Braces or Orthoses: Other Brace/Splint Other Brace/Splint: abdominal binder, JP drain Restrictions Weight Bearing Restrictions: No    Mobility  Bed Mobility               General bed mobility comments: in recliner on entry   Transfers Overall transfer level: Needs assistance   Transfers: Sit to/from Stand Sit to Stand: Supervision         General transfer comment: pt with good power up to RW   Ambulation/Gait Ambulation/Gait assistance: Supervision Ambulation Distance (Feet): 1050 Feet Assistive device: Rolling walker (2 wheeled) Gait Pattern/deviations: Step-through pattern;Shuffle Gait velocity: decreased Gait velocity interpretation: at or above normal speed for age/gender General Gait Details: slow, steady cadence able to carry on conversation with no SoB       Balance Overall balance assessment: Needs assistance Sitting-balance support: Feet supported;No upper extremity supported Sitting balance-[name] Scale: Good     Standing balance support: Bilateral upper  extremity supported Standing balance-[name] Scale: Good                              Cognition Arousal/Alertness: Awake/alert Behavior During Therapy: Anxious;WFL for tasks assessed/performed Overall Cognitive Status: Within Functional Limits for tasks assessed                                               Pertinent Vitals/Pain Pain Assessment: 0-10 Pain Score: 3  Pain Location: abdomen Pain Descriptors / Indicators: Aching Pain Intervention(s): Monitored during session  VSS           PT Goals (current goals can now be found in the care plan section) Acute Rehab PT Goals Patient Stated Goal: to travel with his wife PT Goal Formulation: With patient Time For Goal Achievement: 04/25/17 Potential to Achieve Goals: Good Progress towards PT goals: Goals met/education completed, patient discharged from PT    Frequency    Min 3X/week      PT Plan Discharge plan needs to be updated       End of Session Equipment Utilized During Treatment: Gait belt Activity Tolerance: Patient tolerated treatment well;No increased pain Patient left: in chair;with call bell/phone within reach Nurse Communication: Mobility status PT Visit Diagnosis: Muscle weakness (generalized) (M62.81);Pain Pain - part of body:  (abdomen)     Time: 2353-6144 PT Time Calculation (min) (ACUTE ONLY): 35 min  Charges:  $Gait Training: 23-37 mins  G Codes:       [name] B. [name] PT, DPT Acute Rehabilitation  [phone] Pager [phone]     [place] 04/13/2017, 3:23 PM

## 2017-04-13 NOTE — Progress Notes (Signed)
Progress Note  [name] Date of Encounter: 04/13/2017  Primary Cardiologist: Nichalas Coin [place] MD  Subjective   Feels better today. Felt queasy last night. No BM yet.  No chest pain or SOB. Ambulating well.   Inpatient Medications    Scheduled Meds: . digoxin  62.5 mcg Oral Q1200  . enoxaparin (LOVENOX) injection  100 mg Subcutaneous Q12H  . fenofibrate  160 mg Oral Daily  . folic acid  1 mg Oral Daily  . furosemide  40 mg Oral BID  . losartan  25 mg Oral QHS  . metoprolol succinate  25 mg Oral Q1200  . multivitamin with minerals   Oral Daily  . pantoprazole  40 mg Oral BID  . polyethylene glycol  17 g Oral Daily  . [START ON 04/14/2017] potassium chloride SA  20 mEq Oral BID  . potassium chloride  40 mEq Oral BID  . pravastatin  40 mg Oral QHS  . sertraline  100 mg Oral Daily  . vitamin C  250 mg Oral BID  . warfarin  7.5 mg Oral ONCE-1800  . Warfarin - Pharmacist Dosing Inpatient   Does not apply q1800   Continuous Infusions: . lactated ringers 50 mL/hr at 04/13/17 0458   PRN Meds: acetaminophen, methocarbamol, nitroGLYCERIN, ondansetron **OR** ondansetron (ZOFRAN) IV, traMADol   Vital Signs    Vitals:   04/12/17 0506 04/12/17 1318 04/12/17 2031 04/13/17 0521  BP: 121/61 125/60 (!) 144/64 133/67  Pulse: 71 73 69 69  Resp: 19 18 18 19   Temp: 98.1 F (36.7 C) 98.6 F (37 C) 97.7 F (36.5 C) 98.2 F (36.8 C)  TempSrc: Oral Oral Oral Oral  SpO2: 98% 95% 96% 96%  Weight:      Height:        Intake/Output Summary (Last 24 hours) at 04/13/17 0900 Last data filed at 04/13/17 0604  Gross per 24 hour  Intake              420 ml  Output             2325 ml  Net            -1905 ml   Filed Weights   04/10/17 1144  Weight: 212 lb 9.6 oz (96.4 kg)    Telemetry    Afib with paced ventricular rhythm. Rare PVC, 5 and 7 beat run NSVT- Personally Reviewed  ECG    none  Physical Exam   GEN: No acute distress.   Neck: No JVD Cardiac: RRR,  no murmurs, rubs, or gallops.  Respiratory: Clear to auscultation bilaterally. GI: surgical dressing in place. MS: No edema; pulses good Neuro:  Nonfocal  Psych: Normal affect   Labs    Chemistry  Recent Labs Lab 04/11/17 0428 04/13/17 0445  NA 146* 141  K 3.8 3.4*  CL 111 99*  CO2 28 32  GLUCOSE 124* 108*  BUN 20 16  CREATININE 1.33* 1.43*  CALCIUM 9.3 9.9  GFRNONAA 53* 48*  GFRAA >60 56*  ANIONGAP 7 10     Hematology  Recent Labs Lab 04/11/17 0428 04/12/17 0642 04/13/17 0445  WBC 11.9* 9.7 9.9  RBC 4.36 4.48 4.73  HGB 11.8* 12.4* 13.1  HCT 37.2* 38.7* 40.9  MCV 85.3 86.4 86.5  MCH 27.1 27.7 27.7  MCHC 31.7 32.0 32.0  RDW 14.4 14.7 14.5  PLT 197 189 216    Cardiac EnzymesNo results for input(s): TROPONINI in the last 168 hours. No  results for input(s): TROPIPOC in the last 168 hours.   BNPNo results for input(s): BNP, PROBNP in the last 168 hours.   DDimer No results for input(s): DDIMER in the last 168 hours.   Radiology    No results found.  Cardiac Studies   none  Patient Profile     71 y.o. male with past medical history of atrial fibrillation (intolerant of Amiodarone, not a Tikosyn candidate due to prolonged QT, s/p ablation in 01/2017), CAD (s/p PCI to LAD in 2000), ischemic cardiomyopathy (s/p ICD placement with gen change in 67/5916), chronic systolic CHF (EF 38-46%), bilateral DVT (on Coumadin) and history of perforated duodenal ulcer with bleeding (in 02/2016) who is s/p ventral hernia repair on 04/10/17 by Dr. Dalbert Batman.   Assessment & Plan    1. Persistent Atrial Fibrillation - has a known history of atrial fibrillation intolerant of Amiodarone and not a Tikosyn candidate due to prolonged QT. He underwent AV node ablation in 01/2017.  - has been V-paced with HR in the 80's on telemetry since his procedure.  - This patients CHA2DS2-VASc Score and unadjusted Ischemic Stroke Rate (% per year) is equal to 9.7 % stroke rate/year from a score  of 6 (CHF, HTN, Vascular, Age, TE (2)). Coumadin resumed post op.  Continue with Lovenox bridge until INR > 2.0. INR 1.36 today. - continue Toprol-XL and Digoxin.   2. Ischemic cardiomyopathy/ Chronic Systolic CHF - EF at 65-99% by echo in 12/2015, s/p ICD placement with gen change in 08/2013. - he does not appear volume overloaded on physical examination.  -  He has good PO fluid intake at this time. OK to stop IVF when taking po's well.  - continue BB and ARB.   3. CAD  - s/p PCI to LAD in 2000.  - he denies any recent anginal symptoms. - continue BB and statin. No ASA secondary to need for Coumadin.   4. History of Bilateral DVT's - Coumadin with Lovenox bridge as above.   5. Open repair of the ventral incisional hernia  - per Surgery. Making good progress  6. NSVT. Asymptomatic. Need to replete potassium   Signed, Johanna Stafford [place], MD  04/13/2017, 9:00 AM

## 2017-04-13 NOTE — Progress Notes (Signed)
Physical Therapy Discharge Patient Details Name: [name] MRN: [id number] DOB: Dec 14, 1946 Today's Date: 04/13/2017 Time: 8828-0034 PT Time Calculation (min) (ACUTE ONLY): 35 min  Patient discharged from PT services secondary to goals met and no further PT needs identified.  Please see latest therapy progress note for current level of functioning and progress toward goals.    Progress and discharge plan discussed with patient and/or caregiver: Patient/Caregiver agrees with plan  GP  Dani Gobble. Migdalia Dk PT, DPT Acute Rehabilitation  [phone] Pager [phone]     [place] 04/13/2017, 3:26 PM

## 2017-04-13 NOTE — Progress Notes (Signed)
[place] for Lovenox + warfarin Indication: atrial fibrillation  Allergies  Allergen Reactions  . Keflex [Cephalexin] Shortness Of Breath and Other (See Comments)    dizziness    Patient Measurements: Height: 5\' 10"  (177.8 cm) Weight: 212 lb 9.6 oz (96.4 kg) IBW/kg (Calculated) : 73  Vital Signs: Temp: 98.2 F (36.8 C) (04/26 0521) Temp Source: Oral (04/26 0521) BP: 133/67 (04/26 0521) Pulse Rate: 69 (04/26 0521)  Labs:  Recent Labs  04/10/17 1248  04/11/17 0428 04/12/17 0642 04/13/17 0445  HGB  --   < > 11.8* 12.4* 13.1  HCT  --   --  37.2* 38.7* 40.9  PLT  --   --  197 189 216  APTT 29  --   --   --   --   LABPROT 14.2  --  14.4 14.6 16.9*  INR 1.09  --  1.11 1.13 1.36  CREATININE  --   --  1.33*  --  1.43*  < > = values in this interval not displayed.  Estimated Creatinine Clearance: 56.8 mL/min (A) (by C-G formula based on SCr of 1.43 mg/dL (H)).   Assessment: Pt admitted with incisional hernia, he is POD #2 s/p repair of the ventral hernia. He was on warfarin prior to admission for hx of AFib. He was bridged peri-operatively with Lovenox-last dose of warfarin was on 4/17.   Now bridging with Lovenox back to therapeutic warfarin. Current INR 1.36  Renal: SCr 1.43  Heme/Onc: H&H 13.1/40.9, plt 216  PTA warfarin: 2.5 mg po daily  Goal of Therapy:  INR 2-3 Monitor platelets by anticoagulation protocol: Yes    Plan:  -Lovenox 100 mg [place] q12h -Warfarin 7.5mg  po x1 -Daily INR, CBC q72h -Stop Lovenox when INR > 2  Levester Fresh, PharmD, BCPS, BCCCP Clinical Pharmacist Clinical phone for 04/13/2017 from 7a-3:30p: [phone] If after 3:30p, please call main pharmacy at: [phone] 04/13/2017 8:26 AM

## 2017-04-14 LAB — PROTIME-INR
INR: 1.76
Prothrombin Time: 20.8 seconds — ABNORMAL HIGH (ref 11.4–15.2)

## 2017-04-14 MED ORDER — WARFARIN SODIUM 5 MG PO TABS: 2.5000 mg | ORAL_TABLET | Freq: Once | ORAL | Status: DC

## 2017-04-14 MED ORDER — TRAMADOL HCL 50 MG PO TABS: 100.0000 mg | ORAL_TABLET | Freq: Two times a day (BID) | ORAL | 0 refills | Status: DC | PRN

## 2017-04-14 NOTE — Care Management Important Message (Signed)
Important Message  Patient Details  Name: [name] MRN: [id number] Date of Birth: 28-Dec-1945   Medicare Important Message Given:  Yes    Nathen May 04/14/2017, 2:44 PM

## 2017-04-14 NOTE — Care Management Note (Signed)
Case Management Note  Patient Details  Name: EFFIE JANOSKI MRN: [id number] Date of Birth: 1946-12-13  Subjective/Objective:       Hernia repair with mesh             Action/Plan: Discharge Planning: NCM spoke to pt at bedside. No NCM needs identified. Pt reports he has RW at home.   PCP Kathryne Eriksson H  Expected Discharge Date:  04/14/17               Expected Discharge Plan:  Home/Self Care  In-House Referral:  NA  Discharge planning Services  CM Consult  Post Acute Care Choice:  NA Choice offered to:  NA  DME Arranged:  N/A DME Agency:  NA  HH Arranged:  NA HH Agency:  NA  Status of Service:  Completed, signed off  If discussed at [place] of Stay Meetings, dates discussed:    Additional Comments:  Erenest Rasher, RN 04/14/2017, 11:22 AM

## 2017-04-14 NOTE — Progress Notes (Signed)
[place] for Lovenox + warfarin Indication: atrial fibrillation  Allergies  Allergen Reactions  . Keflex [Cephalexin] Shortness Of Breath and Other (See Comments)    dizziness    Patient Measurements: Height: 5\' 10"  (177.8 cm) Weight: 212 lb 9.6 oz (96.4 kg) IBW/kg (Calculated) : 73  Assessment: Pt admitted with incisional hernia. On Coumadin 2.5mg  PO PTA for AFib + hx of DVT in lower extremity. Now bridging back to warfarin with Lovenox s/p ventral hernia repair. Have been giving higher doses over last few days. INR up to 1.76 today. CBC stable, no s/s of bleed.  Goal of Therapy:  INR 2-3 Monitor platelets by anticoagulation protocol: Yes  Plan:  Continue Lovenox 100 mg [place] q12h Give Coumadin 2.5mg  po x 1 Monitor daily INR, CBC, s/s of bleed Stop Lovenox when INR > 2  Possible discharge today, would recommend restarting 2.5mg  daily

## 2017-04-14 NOTE — Discharge Summary (Signed)
Patient ID: [name] [id number] 71 y.o. 1946-04-18  Admit date: 04/10/2017  Discharge date and time:   Admitting Physician: [name]  Discharge Physician: [name]  Admission Diagnoses: incisional hernia  Discharge Diagnoses: Ventral incisional hernia without obstruction                                         History perforated duodenal ulcer                                         Systolic CHF, chronic                                         Anticoagulated on Coumadin                                         Biventricular AICD                                         CKD, stage III                                          Ischemic cardiomyopathy                                          History DVT lower extremity                                          History TURP prostate                                          History C. difficile colitis                                           Atrial fibrillation with RVR                                          Operations: Procedure(s): OPEN REPAIR INCISIONAL VENTRAL HERNIA INSERTION OF MESH (retrorectus repair)  Admission Condition: good  Discharged Condition: good  Indication for Admission:  This is a very pleasant 71 year old gentleman who is admitted for elective repair of ventral incisional hernia. The hernia is getting larger. It is uncomfortable. Never incarcerated. He hates the abdominal binder. It is very inconvenient. He has seen Dr. Peter [name] who states that that he is acceptable risk. Dr. Caryl [name] is his  electrophysiologist. Dr. Michail [name] is his gastroenterologist. [name] is his PCP at [facility] family practice. The patient takes Coumadin, and the Lovenox bridge is being managed by Dr. Doug [name] Coumadin clinic staff. He presented with a duodenal ulcer in April 2017 and Dr. Donne [name] took him to the operating room for patch closure. Said to be a tenuous closure. Skin  packed open. Did heal. On the ventilator for 2 or 3 days. He did recover. Follow-up endoscopy by Dr. Michail [name] on August 31, 2016 showed some gastritis and a polyp but no active ulcer. He is doing well from a GI standpoint.     Significant comorbidities include systolic congestive heart failure, ischemic cardiomyopathy with ejection fraction 20-23%. Myocardial infarction year 2000. Pacemaker and AICD in place for atrial fibrillation. Has had endocardial ablation on January 25, 2017. This was AV node ablation. He did well with that. He remains on Coumadin. History of C. difficile colitis. TURP. CKD3. Hypertension. History of DVT lower extremity. We had a long talk. I told him that he had a incisional hernia and also had diastases recti. I told him that the diastases recti may persist and he may have a bulge but the hernia could be repaired. We will plan an open technique with a retrorectus repair, possible TAR release. Implant mesh.  he will be scheduled for open repair of ventral incisional hernia with mesh. . He agrees with this plan.  Hospital Course: On the day of admission the patient was taken to the operating room and underwent open repair of his fairly large ventral incisional hernia.     The patient's ventral hernia defect was probably 15 cm vertically, multiple defects,, and about 5-6 cm transversely.  There were omental adhesions and transverse colon adhesions which were soft and taken down without a problem.  There was no evidence of intra-abdominal inflammatory or obstructive disease.  I was able to perform a retrorectus repair.  I used a 16 cm transversely by 22 cm vertically piece of ultra Pro mesh which was placed between the posterior rectus sheath and the rectus muscle.  The anterior sheath was closed in the midline to cover the mesh.    Postoperatively the patient progressed reasonably well.  He had an ileus for 2 or 3 days but eventually began having bowel  movements and passing flatus and his diet was advanced to regular food.  His Foley catheter was removed and he had no trouble urinating.  He ambulated in the halls progressively, initially with the help of physical therapy but ultimately independently.  He was followed by Dr. Peter [name] because of his complex cardiac history.  His cardiac status remained stable.      In terms of his anticoagulation, it was held for 24 hours prior to surgery and resumed on postop day 1.  The pharmacy managed old dose Lovenox and resumption of Coumadin.  INR on the date of discharge was 1.79.  He has instructions from Dr. Doug [name] anticoagulation clinic.  They instructed him to continue his daily Coumadin dose, 2.5 mg and to take the Lovenox twice a day.  He has an appointment to see the anticoagulation clinic for INR check on April 30 and he knows to keep that appointment.     On the day of discharge the patient was doing well and wanted to go home.  He was happy that his GI tract at normalized.  JP drainage was minimal and the JP drain was removed.  The wounds look fine.  Some ecchymoses  but no hematoma or infection.  Abdomen was soft and minimally tender.      He will use high-dose Tylenol for pain and was given a prescription for tramadol as a backup.  He will otherwise continue all of his usual medications.  He was given instruction in diet and activities.  We will see him back in the office in 7-10 days for staple removal and wound check.  Consults: cardiology  Significant Diagnostic Studies: labs:   Treatments: surgery: described above  Disposition: Home  Patient Instructions:  Allergies as of 04/14/2017      Reactions   Keflex [cephalexin] Shortness Of Breath, Other (See Comments)   dizziness      Medication List    TAKE these medications   acetaminophen 500 MG tablet Commonly known as:  TYLENOL Take 500 mg by mouth every 8 (eight) hours as needed for mild pain.   digoxin 0.125 MG tablet Commonly  known as:  DIGOX Take 0.5 tablets (62.5 mcg total) by mouth daily. What changed:  when to take this   enoxaparin 100 MG/ML injection Commonly known as:  LOVENOX Inject 1 mL (100 mg total) into the skin every 12 (twelve) hours.   fenofibrate 160 MG tablet TAKE 1 TABLET BY MOUTH  DAILY   ferrous sulfate 325 (65 FE) MG tablet Take 325 mg by mouth 2 (two) times daily.   folic acid 676 MCG tablet Commonly known as:  FOLVITE Take 800 mcg by mouth daily.   furosemide 40 MG tablet Commonly known as:  LASIX TAKE 1 TABLET BY MOUTH TWO  TIMES DAILY   losartan 25 MG tablet Commonly known as:  COZAAR Take 1 tablet (25 mg total) by mouth daily. What changed:  when to take this   metoprolol succinate 25 MG 24 hr tablet Commonly known as:  TOPROL-XL Take 25 mg by mouth daily at 12 noon.   MULTIVITAMIN ADULTS PO Take 1 tablet by mouth daily.   nitroGLYCERIN 0.4 MG SL tablet Commonly known as:  NITROSTAT Place 1 tablet (0.4 mg total) under the tongue every 5 (five) minutes as needed for chest pain.   pantoprazole 40 MG tablet Commonly known as:  PROTONIX Take 2 tablets daily What changed:  how much to take  how to take this  when to take this  additional instructions   potassium chloride SA 20 MEQ tablet Commonly known as:  K-DUR,KLOR-CON Take 1 tablet (20 mEq total) by mouth daily. What changed:  when to take this   potassium chloride SA 20 MEQ tablet Commonly known as:  K-DUR,KLOR-CON TAKE 1 TABLET BY MOUTH  DAILY (TAKE ADDITIONAL  TABLET WHEN TAKING EXTRA  DOSAGE OF FUROSEMIDE/LASIX) What changed:  You were already taking a medication with the same name, and this prescription was added. Make sure you understand how and when to take each.   pravastatin 40 MG tablet Commonly known as:  PRAVACHOL TAKE 1 TABLET BY MOUTH  DAILY What changed:  See the new instructions.   sertraline 100 MG tablet Commonly known as:  ZOLOFT Take 100 mg by mouth daily.   traMADol 50 MG  tablet Commonly known as:  ULTRAM Take 2 tablets (100 mg total) by mouth every 12 (twelve) hours as needed for moderate pain.   vitamin C 250 MG tablet Commonly known as:  ASCORBIC ACID Take 250 mg by mouth 2 (two) times daily.   warfarin 5 MG tablet Commonly known as:  COUMADIN Take 0.5 tablets (2.5 mg total) by mouth  daily at 6 PM. What changed:  when to take this  additional instructions       Activity: No sports or heavy lifting for 6 weeks.  No driving for 2 weeks Diet: low fat, low cholesterol diet Wound Care: as directed  Follow-up:  With Dr. Dalbert Batman in 7-10 days.  Signed: Edsel Petrin. Dalbert Batman, M.D., FACS General and minimally invasive surgery Breast and Colorectal Surgery  04/14/2017, 7:10 AM

## 2017-04-14 NOTE — Care Management Important Message (Signed)
Important Message  Patient Details  Name: [name] MRN: [id number] Date of Birth: 03-03-1946   Medicare Important Message Given:  Yes    Erenest Rasher, RN 04/14/2017, 11:21 AM

## 2017-04-17 ENCOUNTER — Ambulatory Visit (INDEPENDENT_AMBULATORY_CARE_PROVIDER_SITE_OTHER): Payer: Medicare Other | Admitting: Pharmacist

## 2017-04-17 DIAGNOSIS — I4891 Unspecified atrial fibrillation: Secondary | ICD-10-CM

## 2017-04-17 LAB — POCT INR: INR: 2.1

## 2017-04-18 ENCOUNTER — Telehealth: Payer: Self-pay

## 2017-04-18 NOTE — Telephone Encounter (Signed)
Remote ICM transmission received.  Attempted patient call and left detailed message regarding transmission and next ICM scheduled for 06/06/2017.  Advised to return call for any fluid symptoms or questions.    

## 2017-04-18 NOTE — Progress Notes (Signed)
EPIC Encounter for ICM Monitoring  [name] is a 71 y.o. male Date: 04/18/2017 Primary Care Physican: Woody Seller, MD Primary [place] Electrophysiologist: Faustino Congress Weight:unknown Bi-V Pacing: 97.5%                                                         Attempted call to patient and unable to reach.  Left detailed message regarding transmission.  Transmission reviewed.    Thoracic impedance normal.  Patient had inpatient hernia surgery and discharged on 04/10/2017  Prescribed dosage: Furosemide 40 mg 1 tablet bid.  Potassium 20 mEq 1 tablet daily  Labs: 04/13/2017 Creatinine 1.43, BUN 16, Potassium 3.4, Sodium 141, EGFR 48-56 04/11/2017 Creatinine 1.33, BUN 20, Potassium 3.8, Sodium 146, EGFR 53->60  04/04/2017 Creatinine 1.45, BUN 28, Potassium 4.0, Sodium 142, EGFR 48-55  01/18/2017 Creatinine 1.78, BUN 31, Potassium 4.0, Sodium 144, EGFR 38-44  12/21/2016 Creatinine 1.63, BUN 30, Potassium 4.0, Sodium 145 12/07/2016 Creatinine 1.71, BUN 39, Potassium 4.1, Sodium 141 11/25/2016 Creatinine 1.76, BUN 29, Potassium 4.2, Sodium 144 08/11/2016 Creatinine 1.59, BUN 34, Potassium 3.9, Sodium 141 05/12/2016 Creatinine 1.27, BUN 22, Potassium 3.5, Sodium 144 05/05/2016 Creatinine 1.22, BUN 17, Potassium 3.2, Sodium 143 03/14/2016 Creatinine 1.39, BUN 25, Potassium 3.4, Sodium 145  Recommendations:Left voice mail with ICM number and encouraged to call for fluid symptoms.  Follow-up plan: ICM clinic phone appointment on 06/06/2017.  Defib office appointment scheduled on 05/03/2017 with Dr Caryl Comes.  Copy of ICM check sent to device physician.   3 month ICM trend: 04/14/2017    1 Year ICM trend:      [name], RN 04/18/2017 10:15 AM

## 2017-05-01 ENCOUNTER — Telehealth: Payer: Self-pay | Admitting: Cardiology

## 2017-05-01 NOTE — Telephone Encounter (Addendum)
05/01/2017 Received faxed medical records from [facility] for Upcoming appointment with Dr. Martinique on 06/01/2017.  Records given to [facility]. cbr

## 2017-05-01 NOTE — Telephone Encounter (Deleted)
Upcoming appointment with Dr. Martinique on 06/01/2017.  Records given to [facility]. cbr

## 2017-05-03 ENCOUNTER — Ambulatory Visit (INDEPENDENT_AMBULATORY_CARE_PROVIDER_SITE_OTHER): Payer: Medicare Other | Admitting: Internal Medicine

## 2017-05-03 ENCOUNTER — Telehealth: Payer: Self-pay

## 2017-05-03 ENCOUNTER — Other Ambulatory Visit: Payer: Self-pay

## 2017-05-03 ENCOUNTER — Encounter: Payer: Self-pay | Admitting: Internal Medicine

## 2017-05-03 VITALS — BP 106/54 | HR 81 | Ht 70.0 in | Wt 207.0 lb

## 2017-05-03 DIAGNOSIS — I255 Ischemic cardiomyopathy: Secondary | ICD-10-CM

## 2017-05-03 DIAGNOSIS — I480 Paroxysmal atrial fibrillation: Secondary | ICD-10-CM

## 2017-05-03 DIAGNOSIS — Z95810 Presence of automatic (implantable) cardiac defibrillator: Secondary | ICD-10-CM

## 2017-05-03 DIAGNOSIS — I5022 Chronic systolic (congestive) heart failure: Secondary | ICD-10-CM

## 2017-05-03 DIAGNOSIS — Z79899 Other long term (current) drug therapy: Secondary | ICD-10-CM

## 2017-05-03 LAB — CUP PACEART INCLINIC DEVICE CHECK
Battery Remaining Longevity: 31 mo
Battery Voltage: 2.95 V
Brady Statistic AP VP Percent: 0 %
Brady Statistic AP VS Percent: 0 %
Brady Statistic AS VP Percent: 98.77 %
Brady Statistic RA Percent Paced: 0 %
HIGH POWER IMPEDANCE MEASURED VALUE: 76 Ohm
HighPow Impedance: 57 Ohm
Implantable Lead Implant Date: 20090226
Implantable Lead Implant Date: 20090226
Implantable Lead Location: 753858
Implantable Lead Model: 4194
Implantable Lead Model: 6947
Lead Channel Pacing Threshold Amplitude: 0.75 V
Lead Channel Pacing Threshold Pulse Width: 0.6 ms
Lead Channel Sensing Intrinsic Amplitude: 12 mV
Lead Channel Sensing Intrinsic Amplitude: 16.875 mV
Lead Channel Sensing Intrinsic Amplitude: 4.25 mV
Lead Channel Setting Pacing Amplitude: 1.75 V
Lead Channel Setting Pacing Amplitude: 2.5 V
Lead Channel Setting Pacing Pulse Width: 0.6 ms
Lead Channel Setting Pacing Pulse Width: 0.6 ms
MDC IDC LEAD IMPLANT DT: 20090226
MDC IDC LEAD LOCATION: 753859
MDC IDC LEAD LOCATION: 753860
MDC IDC MSMT LEADCHNL LV IMPEDANCE VALUE: 323 Ohm
MDC IDC MSMT LEADCHNL LV IMPEDANCE VALUE: 646 Ohm
MDC IDC MSMT LEADCHNL LV IMPEDANCE VALUE: 836 Ohm
MDC IDC MSMT LEADCHNL LV PACING THRESHOLD AMPLITUDE: 0.75 V
MDC IDC MSMT LEADCHNL LV PACING THRESHOLD PULSEWIDTH: 0.6 ms
MDC IDC MSMT LEADCHNL RA IMPEDANCE VALUE: 570 Ohm
MDC IDC MSMT LEADCHNL RA SENSING INTR AMPL: 3.625 mV
MDC IDC MSMT LEADCHNL RV IMPEDANCE VALUE: 323 Ohm
MDC IDC MSMT LEADCHNL RV IMPEDANCE VALUE: 399 Ohm
MDC IDC PG IMPLANT DT: 20140929
MDC IDC SESS DTM: 20180516155007
MDC IDC SET LEADCHNL RV SENSING SENSITIVITY: 0.3 mV
MDC IDC STAT BRADY AS VS PERCENT: 1.23 %
MDC IDC STAT BRADY RV PERCENT PACED: 97.93 %

## 2017-05-03 NOTE — Progress Notes (Signed)
Patient Care Team: Christain Sacramento, MD as PCP - General (Family Medicine)   HPI  [name] is a 71 y.o. male Seen in followup of afib for which he recently saw Dr Greggory Brandy and the was decision to not pursue P VI He also has ichemic cardiomyopathy and HFrEF with prior CRT-D from which he garnered little benefit. CXR demonstrates good lead location  We had spent many months deciding what to do about rate control and finally underwent AV junction ablation 2/18   He is modestly improved breathing   Hospitalized 3/17 for GI bleed 2/2 ulcers requring surgery for perforation  now back on warfain  Recently underwent incisional hernia repair   Which is slowly healing  DATE TEST    1/17    Echo   EF 20.25 %          He has been more euvolemic of late but is still struggled with heart failure and volume overload Date Cr K Dig         4/18 1.43 3.4       Past Medical History:  Diagnosis Date  . AICD (automatic cardioverter/defibrillator) present   . Anemia   . Anxiety   . CHF (congestive heart failure) ([place])   . Chronic renal insufficiency   . Coronary artery disease   . Depression   . Dyslipidemia   . Dysrhythmia    atrial fibrillation  . ED (erectile dysfunction)   . GERD (gastroesophageal reflux disease)   . Incisional hernia, without obstruction or gangrene 04/10/2017  . Ischemic cardiomyopathy    EF 23%  . MI (myocardial infarction) ([place]) 2000   ANTERIOR, s/p PCI  . Other primary cardiomyopathies   . Peripheral vascular disease ([place])    dvt legs 2017  . Persistent atrial fibrillation (H[facility])    on coumadin  . Presence of permanent cardiac pacemaker   . Shortness of breath dyspnea    on exertion    Past Surgical History:  Procedure Laterality Date  . AV NODE ABLATION N/A 01/25/2017   Procedure: AV Node Ablation;  Surgeon: [name], MD;  Location: [place] CV LAB;  Service: Cardiovascular;  Laterality: N/A;  . AV NODE ABLATION  01/25/2017  . BIV ICD  GENERTAOR CHANGE OUT N/A 09/16/2013   Procedure: BIV ICD GENERTAOR CHANGE OUT;  Surgeon: [name], MD;  Location: [facility] CATH LAB;  Service: Cardiovascular;  Laterality: N/A;  . BREAST LUMPECTOMY Left   . CARDIAC CATHETERIZATION  02/05/2008   MODERATE TO SEVERE LEFT VENTRICULAR  DYSFUNCTION WITH EF 25-30%  . CARDIAC DEFIBRILLATOR PLACEMENT  2009   MDT BI-VENTRICULAR ICD by Dr Caryl Comes  . COLONOSCOPY WITH PROPOFOL N/A 08/31/2016   Procedure: COLONOSCOPY WITH PROPOFOL;  Surgeon: Wilford Corner, MD;  Location: [facility] ENDOSCOPY;  Service: Endoscopy;  Laterality: N/A;  . CORONARY ANGIOPLASTY  2000   stent  . CORONARY STENT PLACEMENT     LAD  . CYSTOSCOPY N/A 01/25/2016   Procedure: CYSTOSCOPY;  Surgeon: Carolan Clines, MD;  Location: [facility] ORS;  Service: Urology;  Laterality: N/A;  . ESOPHAGOGASTRODUODENOSCOPY (EGD) WITH PROPOFOL N/A 08/31/2016   Procedure: ESOPHAGOGASTRODUODENOSCOPY (EGD) WITH PROPOFOL;  Surgeon: Wilford Corner, MD;  Location: [facility] ENDOSCOPY;  Service: Endoscopy;  Laterality: N/A;  . FRACTURE SURGERY Right 1972   leg  . IMPLANTABLE CARDIOVERTER DEFIBRILLATOR GENERATOR CHANGE  2014  . INCISIONAL HERNIA REPAIR N/A 04/10/2017   Procedure: OPEN REPAIR INCISIONAL VENTRAL HERNIA;  Surgeon: [name], MD;  Location: [place];  Service: General;  Laterality: N/A;  . INSERT / REPLACE / REMOVE PACEMAKER     ICD  . INSERTION OF MESH N/A 04/10/2017   Procedure: INSERTION OF MESH;  Surgeon: [name], MD;  Location: [place];  Service: General;  Laterality: N/A;  . LAPAROTOMY N/A 02/22/2016   Procedure: EXPLORATORY LAPAROTOMY WITH  PATCH OF DUODENAL ULCER;  Surgeon: Rolm Bookbinder, MD;  Location: [place];  Service: General;  Laterality: N/A;  . MULTIPLE TOOTH EXTRACTIONS    . TRANSTHORACIC ECHOCARDIOGRAM  12/2010   EF 30-35%  . TRANSURETHRAL RESECTION OF PROSTATE N/A 01/25/2016   Procedure: TRANSURETHRAL RESECTION OF THE PROSTATE (TURP);  Surgeon: Carolan Clines, MD;  Location: [facility] ORS;   Service: Urology;  Laterality: N/A;  . WRIST FRACTURE SURGERY Left 1972    Current Outpatient Prescriptions  Medication Sig Dispense Refill  . acetaminophen (TYLENOL) 500 MG tablet Take 500 mg by mouth every 8 (eight) hours as needed for mild pain.    Marland Kitchen digoxin (DIGOX) 0.125 MG tablet Take 0.5 tablets (62.5 mcg total) by mouth daily. (Patient taking differently: Take 62.5 mcg by mouth daily at 12 noon. ) 90 tablet 3  . fenofibrate 160 MG tablet TAKE 1 TABLET BY MOUTH  DAILY 90 tablet 3  . ferrous sulfate 325 (65 FE) MG tablet Take 325 mg by mouth 2 (two) times daily.    . folic acid (FOLVITE) 299 MCG tablet Take 800 mcg by mouth daily.     . furosemide (LASIX) 40 MG tablet TAKE 1 TABLET BY MOUTH TWO  TIMES DAILY 180 tablet 1  . losartan (COZAAR) 25 MG tablet Take 1 tablet (25 mg total) by mouth daily. 30 tablet 3  . metoprolol succinate (TOPROL-XL) 25 MG 24 hr tablet Take 25 mg by mouth daily at 12 noon.     . Multiple Vitamins-Minerals (MULTIVITAMIN ADULTS PO) Take 1 tablet by mouth daily.    . nitroGLYCERIN (NITROSTAT) 0.4 MG SL tablet Place 1 tablet (0.4 mg total) under the tongue every 5 (five) minutes as needed for chest pain. 25 tablet 3  . pantoprazole (PROTONIX) 40 MG tablet Take 80 mg by mouth daily.    . potassium chloride SA (K-DUR,KLOR-CON) 20 MEQ tablet Take 20 mEq by mouth daily.    . pravastatin (PRAVACHOL) 40 MG tablet TAKE 1 TABLET BY MOUTH  DAILY (Patient taking differently: TAKE 1 TABLET BY MOUTH  DAILY AT BEDTIME) 90 tablet 3  . sertraline (ZOLOFT) 100 MG tablet Take 100 mg by mouth daily.      . vitamin C (ASCORBIC ACID) 250 MG tablet Take 250 mg by mouth 2 (two) times daily.    Marland Kitchen warfarin (COUMADIN) 5 MG tablet Take 0.5 tablets (2.5 mg total) by mouth daily at 6 PM. (Patient taking differently: Take 2.5 mg by mouth at bedtime. Currently 0.5 tablet 7 days weekly) 30 tablet 0   No current facility-administered medications for this visit.     Allergies  Allergen  Reactions  . Keflex [Cephalexin] Shortness Of Breath and Other (See Comments)    dizziness    Review of Systems negative except from HPI and PMH  Physical Exam BP (!) 106/54   Pulse 81   Ht 5\' 10"  (1.778 m)   Wt 207 lb (93.9 kg)   SpO2 97%   BMI 29.70 kg/m  Well developed and nourished in no acute distress HENT normal Neck supple with JVP-flat Carotids brisk and full without bruits Clear Regular rate and  rhythm, no murmurs or gallops Abd-soft with active BS without hepatomegaly  Big bandage on belly No Clubbing cyanosis edema Skin-warm and dry A & Oriented  Grossly normal sensory and motor function  ECG Atrial fibrillation with  biventricular pacing  @ 71  Assessment and  Plan  Atrial fibrillation  -permanent  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  The patient's device was interrogated.  The information was reviewed. No changes were made in the programming.      He is somewhat better following AV ablation. He is undergone surgical repair of his hernia. He is not quite sure what is contributing to his not being quite a surprise he would like.  He is euvolemic.  Blood pressure is low. We will have him take his metoprolol at night as well as his losartan. Given that he has no need for rate control, we may end up stopping the metoprolol. I will defer this decision to Dr. Martinique whom he will see next month.  We will check a digoxin level and his potassium level.

## 2017-05-03 NOTE — Telephone Encounter (Signed)
Called pateint to arrange lab appt. Left message explaining that and requesting patient to come back this afternoon or tomorrow to have CMET and Dig level drawn.

## 2017-05-03 NOTE — Patient Instructions (Signed)
Medication Instructions: Your physician has recommended you make the following change in your medication: 1) Take your Metoprolol at night    Labwork: None ordered  Procedures/Testing: None ordered  Follow-Up: Your physician wants you to follow-up in: 6 months with Dr Gari Crown will receive a reminder letter in the mail two months in advance. If you don't receive a letter, please call our office to schedule the follow-up appointment.  Remote monitoring is used to monitor your  ICD from home. This monitoring reduces the number of office visits required to check your device to one time per year. It allows [place] to keep an eye on the functioning of your device to ensure it is working properly. You are scheduled for a device check from home on 08/02/17. You may send your transmission at any time that day. If you have a wireless device, the transmission will be sent automatically. After your physician reviews your transmission, you will receive a postcard with your next transmission date.    Any Additional Special Instructions Will Be Listed Below (If Applicable).     If you need a refill on your cardiac medications before your next appointment, please call your pharmacy.

## 2017-05-03 NOTE — Addendum Note (Signed)
Addended by: Campbell Riches on: 05/03/2017 02:56 PM   Modules accepted: Orders

## 2017-05-04 LAB — COMPREHENSIVE METABOLIC PANEL
ALK PHOS: 43 IU/L (ref 39–117)
ALT: 11 IU/L (ref 0–44)
AST: 17 IU/L (ref 0–40)
Albumin/Globulin Ratio: 1.5 (ref 1.2–2.2)
Albumin: 4 g/dL (ref 3.6–4.8)
BILIRUBIN TOTAL: 0.5 mg/dL (ref 0.0–1.2)
BUN / CREAT RATIO: 16 (ref 10–24)
BUN: 28 mg/dL — AB (ref 8–27)
CHLORIDE: 101 mmol/L (ref 96–106)
CO2: 25 mmol/L (ref 18–29)
Calcium: 9.8 mg/dL (ref 8.6–10.2)
Creatinine, Ser: 1.72 mg/dL — ABNORMAL HIGH (ref 0.76–1.27)
GFR calc Af Amer: 46 mL/min/{1.73_m2} — ABNORMAL LOW (ref >59)
GFR calc non Af Amer: 40 mL/min/{1.73_m2} — ABNORMAL LOW (ref >59)
GLUCOSE: 81 mg/dL (ref 65–99)
Globulin, Total: 2.6 g/dL (ref 1.5–4.5)
Potassium: 4 mmol/L (ref 3.5–5.2)
Sodium: 144 mmol/L (ref 134–144)
Total Protein: 6.6 g/dL (ref 6.0–8.5)

## 2017-05-04 LAB — DIGOXIN LEVEL: DIGOXIN, SERUM: 0.5 ng/mL (ref 0.5–0.9)

## 2017-05-09 ENCOUNTER — Telehealth: Payer: Self-pay

## 2017-05-09 DIAGNOSIS — I5022 Chronic systolic (congestive) heart failure: Secondary | ICD-10-CM

## 2017-05-09 MED ORDER — FUROSEMIDE 40 MG PO TABS: 40.0000 mg | ORAL_TABLET | Freq: Every day | ORAL | 3 refills | Status: DC

## 2017-05-09 NOTE — Telephone Encounter (Signed)
Informed patient of results and verbal understanding expressed.  Instructed patient to HOLD LASIX for 48 hours then resume Lasix 40 mg daily. CMET scheduled 6/5. Patient agrees with treatment plan.

## 2017-05-09 NOTE — Telephone Encounter (Signed)
-----   Message from [name], MD sent at 05/07/2017  5:59 PM EDT ----- Please Inform Patient that labs are normal x modest worsening of LV function -- lets have him hold lasix x 48 hrs rthen resume 40 daily with recheck in 2 weeks  Thanks

## 2017-05-16 ENCOUNTER — Telehealth: Payer: Self-pay | Admitting: Internal Medicine

## 2017-05-16 NOTE — Telephone Encounter (Signed)
Spoke with the patient.  He called and wanted to clarify his most recent instructions regarding his lasix dose. I advised him that the nurse called and spoke with him on 05/09/17 and advised him, per Dr. Caryl Comes, to hold his lasix x 48 hours then resume at 40 mg once daily due to his renal function. He states the [facility] nurse called him twice in the last week due to an increase in his weight.   5/22- 205 lbs 5/23- 207 lbs 5/24- 209 lbs 5/26-212 lbs 5/27- 213 lbs 5/28- 211 lbs 5/29- 209 lbs  The patient confirms he did hold his lasix x 48 hours as directed and resumed on either Thursday / Friday at 40 mg once daily. I have advised him to continue on lasix 40 mg once daily- he will have a repeat bmp on 6/5- he is due to see Dr. Martinique on 6/14.  The patient verbalizes understanding.

## 2017-05-16 NOTE — Telephone Encounter (Signed)
Patient calling, states that he needs to verify the instructions for Lasix medication. Patient states that he has questions about the dosage and when to stop medication, please call. Thanks.

## 2017-05-23 ENCOUNTER — Other Ambulatory Visit: Payer: Medicare Other | Admitting: *Deleted

## 2017-05-23 DIAGNOSIS — I5022 Chronic systolic (congestive) heart failure: Secondary | ICD-10-CM

## 2017-05-23 LAB — COMPREHENSIVE METABOLIC PANEL
A/G RATIO: 1.9 (ref 1.2–2.2)
ALT: 10 IU/L (ref 0–44)
AST: 18 IU/L (ref 0–40)
Albumin: 4.1 g/dL (ref 3.6–4.8)
Alkaline Phosphatase: 41 IU/L (ref 39–117)
BILIRUBIN TOTAL: 0.6 mg/dL (ref 0.0–1.2)
BUN/Creatinine Ratio: 15 (ref 10–24)
BUN: 20 mg/dL (ref 8–27)
CHLORIDE: 102 mmol/L (ref 96–106)
CO2: 26 mmol/L (ref 18–29)
Calcium: 9.5 mg/dL (ref 8.6–10.2)
Creatinine, Ser: 1.33 mg/dL — ABNORMAL HIGH (ref 0.76–1.27)
GFR calc Af Amer: 63 mL/min/{1.73_m2} (ref >59)
GFR calc non Af Amer: 54 mL/min/{1.73_m2} — ABNORMAL LOW (ref >59)
GLOBULIN, TOTAL: 2.2 g/dL (ref 1.5–4.5)
Glucose: 93 mg/dL (ref 65–99)
POTASSIUM: 3.5 mmol/L (ref 3.5–5.2)
SODIUM: 144 mmol/L (ref 134–144)
Total Protein: 6.3 g/dL (ref 6.0–8.5)

## 2017-05-31 ENCOUNTER — Telehealth: Payer: Self-pay | Admitting: Internal Medicine

## 2017-05-31 NOTE — Telephone Encounter (Signed)
Follow Up:    Pt would like his lab results from 05-23-17 please.

## 2017-05-31 NOTE — Progress Notes (Signed)
[name] Date of Birth: Jan 03, 1946 Medical Record #[id number]  History of Present Illness: Mr. Popper is seen today for followup of atrial fibrillation and CAD.  He has a history of remote anterior myocardial infarction with an ischemic cardiomyopathy and ejection fraction of 23%. He is status post CRT D. He had a generator replacement in September 2014. He also has a history of atrial fibrillation. He is intolerant of amiodarone. His QT interval is too long for Tikosyn. He previously had attempted to increase his carvedilol dose but he developed significant hypotension.  He was admitted from 3/2-3/15/17 with a perforated duodenal ulcer with GI bleed. He required surgery with Phillip Heal patch. He was transfused. He actually did quite well from a cardiac standpoint. Some of his cardiac meds were held due to hypotension. He also had bilateral distal DVTs. He was discharged to [place] and discharged after about 8 days.  In September 2017 he had upper and lower EGD. He had some esophagitis and some nodular irregularity of the gastric mucosa. The duodenal patch was healing. There was a duodenal polyp. On lower EGD he had hemorrhoids, diverticuli, and a 5 cm polyp. Biopsies negative for malignancy. Significant ventral hernia.   In October 2017 his Optivol indicated increased fluid retention and lasix was increased to 40 mg bid. Optivol level improved. Started on [place] but limited due to hypotension and had to be discontinued and losartan resumed. Later noted to have persistent Afib with elevated HR. Seen by Dr. Caryl Comes who noted "There are recent data demonstrating improved outcomes hospitalizations in mortality in patients who undergo AV junction ablation in conjunction with biventricular pacing." On 01/25/17 he underwent AV node ablation. When seen in follow up he had BiV pacing 99.7% of the time. Rate set at 80 bpm with plans to reduce rate to 70 bpm after one month.   When seen in the device  clinic in May he was almost completely paced. Optivol levels stable. He did undergo repair of a ventral hernia by Dr. Dalbert Batman in April without complications.  On follow up today he states he is recovering well from his surgery. Activity still restricted.  He denies any SOB.  Energy level is good. No edema. Weight is stable. No dizziness.     Allergies as of 06/01/2017      Reactions   Keflex [cephalexin] Shortness Of Breath, Other (See Comments)   dizziness      Medication List       Accurate as of 06/01/17 10:40 AM. Always use your most recent med list.          acetaminophen 500 MG tablet Commonly known as:  TYLENOL Take 500 mg by mouth every 8 (eight) hours as needed for mild pain.   digoxin 0.125 MG tablet Commonly known as:  LANOXIN Take 0.125 mg by mouth daily.   fenofibrate 160 MG tablet TAKE 1 TABLET BY MOUTH  DAILY   ferrous sulfate 325 (65 FE) MG tablet Take 325 mg by mouth 2 (two) times daily.   folic acid 099 MCG tablet Commonly known as:  FOLVITE Take 800 mcg by mouth daily.   furosemide 40 MG tablet Commonly known as:  LASIX Take 1 tablet (40 mg total) by mouth daily.   losartan 25 MG tablet Commonly known as:  COZAAR Take 1 tablet (25 mg total) by mouth daily.   metoprolol succinate 25 MG 24 hr tablet Commonly known as:  TOPROL-XL Take 25 mg by mouth daily at 12 noon.  MULTIVITAMIN ADULTS PO Take 1 tablet by mouth daily.   nitroGLYCERIN 0.4 MG SL tablet Commonly known as:  NITROSTAT Place 1 tablet (0.4 mg total) under the tongue every 5 (five) minutes as needed for chest pain.   pantoprazole 40 MG tablet Commonly known as:  PROTONIX Take 80 mg by mouth daily.   potassium chloride SA 20 MEQ tablet Commonly known as:  K-DUR,KLOR-CON Take 20 mEq by mouth daily.   pravastatin 40 MG tablet Commonly known as:  PRAVACHOL TAKE 1 TABLET BY MOUTH  DAILY   sertraline 100 MG tablet Commonly known as:  ZOLOFT Take 100 mg by mouth daily.    vitamin C 250 MG tablet Commonly known as:  ASCORBIC ACID Take 250 mg by mouth 2 (two) times daily.   warfarin 5 MG tablet Commonly known as:  COUMADIN Take 0.5 tablets (2.5 mg total) by mouth daily at 6 PM.        Allergies  Allergen Reactions  . Keflex [Cephalexin] Shortness Of Breath and Other (See Comments)    dizziness    Past Medical History:  Diagnosis Date  . AICD (automatic cardioverter/defibrillator) present   . Anemia   . Anxiety   . CHF (congestive heart failure) ([place])   . Chronic renal insufficiency   . Coronary artery disease   . Depression   . Dyslipidemia   . Dysrhythmia    atrial fibrillation  . ED (erectile dysfunction)   . GERD (gastroesophageal reflux disease)   . Incisional hernia, without obstruction or gangrene 04/10/2017  . Ischemic cardiomyopathy    EF 23%  . MI (myocardial infarction) ([place]) 2000   ANTERIOR, s/p PCI  . Other primary cardiomyopathies   . Peripheral vascular disease ([place])    dvt legs 2017  . Persistent atrial fibrillation (H[facility])    on coumadin  . Presence of permanent cardiac pacemaker   . Shortness of breath dyspnea    on exertion    Past Surgical History:  Procedure Laterality Date  . AV NODE ABLATION N/A 01/25/2017   Procedure: AV Node Ablation;  Surgeon: [name], MD;  Location: [place] CV LAB;  Service: Cardiovascular;  Laterality: N/A;  . AV NODE ABLATION  01/25/2017  . BIV ICD GENERTAOR CHANGE OUT N/A 09/16/2013   Procedure: BIV ICD GENERTAOR CHANGE OUT;  Surgeon: [name], MD;  Location: [facility] CATH LAB;  Service: Cardiovascular;  Laterality: N/A;  . BREAST LUMPECTOMY Left   . CARDIAC CATHETERIZATION  02/05/2008   MODERATE TO SEVERE LEFT VENTRICULAR  DYSFUNCTION WITH EF 25-30%  . CARDIAC DEFIBRILLATOR PLACEMENT  2009   MDT BI-VENTRICULAR ICD by Dr Caryl Comes  . COLONOSCOPY WITH PROPOFOL N/A 08/31/2016   Procedure: COLONOSCOPY WITH PROPOFOL;  Surgeon: Wilford Corner, MD;  Location: [facility] ENDOSCOPY;  Service:  Endoscopy;  Laterality: N/A;  . CORONARY ANGIOPLASTY  2000   stent  . CORONARY STENT PLACEMENT     LAD  . CYSTOSCOPY N/A 01/25/2016   Procedure: CYSTOSCOPY;  Surgeon: Carolan Clines, MD;  Location: [facility] ORS;  Service: Urology;  Laterality: N/A;  . ESOPHAGOGASTRODUODENOSCOPY (EGD) WITH PROPOFOL N/A 08/31/2016   Procedure: ESOPHAGOGASTRODUODENOSCOPY (EGD) WITH PROPOFOL;  Surgeon: Wilford Corner, MD;  Location: [facility] ENDOSCOPY;  Service: Endoscopy;  Laterality: N/A;  . FRACTURE SURGERY Right 1972   leg  . IMPLANTABLE CARDIOVERTER DEFIBRILLATOR GENERATOR CHANGE  2014  . INCISIONAL HERNIA REPAIR N/A 04/10/2017   Procedure: OPEN REPAIR INCISIONAL VENTRAL HERNIA;  Surgeon: [name], MD;  Location: [place];  Service:  General;  Laterality: N/A;  . INSERT / REPLACE / [place]     ICD  . INSERTION OF MESH N/A 04/10/2017   Procedure: INSERTION OF MESH;  Surgeon: [name], MD;  Location: [place];  Service: General;  Laterality: N/A;  . LAPAROTOMY N/A 02/22/2016   Procedure: EXPLORATORY LAPAROTOMY WITH  PATCH OF DUODENAL ULCER;  Surgeon: Rolm Bookbinder, MD;  Location: [place];  Service: General;  Laterality: N/A;  . MULTIPLE TOOTH EXTRACTIONS    . TRANSTHORACIC ECHOCARDIOGRAM  12/2010   EF 30-35%  . TRANSURETHRAL RESECTION OF PROSTATE N/A 01/25/2016   Procedure: TRANSURETHRAL RESECTION OF THE PROSTATE (TURP);  Surgeon: Carolan Clines, MD;  Location: [facility] ORS;  Service: Urology;  Laterality: N/A;  . WRIST FRACTURE SURGERY Left 1972    History  Smoking Status  . Former Smoker  . Types: Cigarettes  . Quit date: 12/23/1998  Smokeless Tobacco  . Never Used    History  Alcohol Use No    Family History  Problem Relation Age of Onset  . Breast cancer Mother   . Depression Mother   . Heart attack Father   . Breast cancer Sister     Review of Systems: As noted in history of present illness. All other systems were reviewed and are negative.  Physical Exam: BP 115/62   Pulse 73    Ht 5\' 10"  (1.778 m)   Wt 207 lb (93.9 kg)   SpO2 99%   BMI 29.70 kg/m  He is a pleasant white male in no acute distress. HEENT: Unremarkable No JVD or bruits. Lungs: Clear Cardiovascular: Regular rate and rhythm, normal S1 and S2. Grade 8-2/4 systolic murmur at apex. Abdomen: Soft, nontender. No masses or bruits. Midline surgical scar is healing well.  Extremities: no edema.  Support hose in place.  Skin: Warm and dry Neuro: Alert oriented x3. Cranial nerves II through XII are intact.  LABORATORY DATA:  Lab Results  Component Value Date   WBC 9.9 04/13/2017   HGB 13.1 04/13/2017   HCT 40.9 04/13/2017   PLT 216 04/13/2017   GLUCOSE 93 05/23/2017   CHOL 112 02/27/2016   TRIG 53 02/29/2016   HDL 32 (L) 02/27/2016   LDLDIRECT 137.6 01/06/2012   LDLCALC 66 02/27/2016   ALT 10 05/23/2017   AST 18 05/23/2017   NA 144 05/23/2017   K 3.5 05/23/2017   CL 102 05/23/2017   CREATININE 1.33 (H) 05/23/2017   BUN 20 05/23/2017   CO2 26 05/23/2017   TSH 3.082 02/18/2016   PSA 3.19 01/19/2016   INR 2.1 04/17/2017    Assessment / Plan: 1. Atrial fibrillation. Now s/p AV node ablation with improved rate control. Will continue with metoprolol for LV dysfunction.  On coumadin and INR checked by primary care.   2. Coronary disease with remote anterior myocardial infarction. He is asymptomatic.  3. Chronic systolic congestive heart failure. He is well compensated today. Currently on metoprolol, lasix, losartan, and digoxin.   Ejection fraction is 20-25% in January 2017. Recent Dig level good at 0.5.   Continue sodium restriction. I would like to repeat Echo but given recent surgery we will let him recover and consider repeat Echo on his next visit.   4. S/p perforated duodenal ulcer and bleeding. S/p surgery with duodenal patch. Now s/p ventral hernia repair.   5.  S/p CRT-D. Followed in device clinic.   6. Bilateral DVT- right soleal, left posterial tibial and peroneal veins. On  coumadin. Resolved.  Medication reviewed and updated list as noted.   We will follow up in 6 months- consider repeat Echo at that time.   Mekisha Bittel [place] MD, [facility]  06/01/2017

## 2017-06-01 ENCOUNTER — Encounter: Payer: Self-pay | Admitting: Cardiology

## 2017-06-01 ENCOUNTER — Ambulatory Visit (INDEPENDENT_AMBULATORY_CARE_PROVIDER_SITE_OTHER): Payer: Medicare Other | Admitting: Cardiology

## 2017-06-01 VITALS — BP 115/62 | HR 73 | Ht 70.0 in | Wt 207.0 lb

## 2017-06-01 DIAGNOSIS — I10 Essential (primary) hypertension: Secondary | ICD-10-CM

## 2017-06-01 DIAGNOSIS — I5022 Chronic systolic (congestive) heart failure: Secondary | ICD-10-CM | POA: Diagnosis not present

## 2017-06-01 DIAGNOSIS — I255 Ischemic cardiomyopathy: Secondary | ICD-10-CM

## 2017-06-01 DIAGNOSIS — Z95810 Presence of automatic (implantable) cardiac defibrillator: Secondary | ICD-10-CM | POA: Diagnosis not present

## 2017-06-01 DIAGNOSIS — I4891 Unspecified atrial fibrillation: Secondary | ICD-10-CM

## 2017-06-01 NOTE — Patient Instructions (Signed)
Continue your current therapy  I will see you in 6 months.   

## 2017-06-06 ENCOUNTER — Telehealth: Payer: Self-pay | Admitting: Cardiology

## 2017-06-06 ENCOUNTER — Telehealth: Payer: Self-pay

## 2017-06-06 NOTE — Telephone Encounter (Signed)
Return call to patient as requested by voice mail message. He stated his monitor is not working and Advice worker is sending a new monitor.  Rescheduled ICM remote transmission for 06/22/2017.

## 2017-06-06 NOTE — Telephone Encounter (Signed)
Spoke with pt and reminded pt of remote transmission that is due today. Pt verbalized understanding.   

## 2017-06-13 ENCOUNTER — Other Ambulatory Visit: Payer: Self-pay | Admitting: Cardiology

## 2017-06-13 ENCOUNTER — Ambulatory Visit (INDEPENDENT_AMBULATORY_CARE_PROVIDER_SITE_OTHER): Payer: Medicare Other

## 2017-06-13 ENCOUNTER — Telehealth: Payer: Self-pay

## 2017-06-13 DIAGNOSIS — Z95810 Presence of automatic (implantable) cardiac defibrillator: Secondary | ICD-10-CM

## 2017-06-13 DIAGNOSIS — I5022 Chronic systolic (congestive) heart failure: Secondary | ICD-10-CM | POA: Diagnosis not present

## 2017-06-13 NOTE — Telephone Encounter (Signed)
Remote ICM transmission received.  Attempted patient call and left detailed message regarding transmission and next ICM scheduled for 07/14/2017.  Advised to return call for any fluid symptoms or questions.    

## 2017-06-13 NOTE — Progress Notes (Signed)
EPIC Encounter for ICM Monitoring  [name] is a 71 y.o. male Date: 06/13/2017 Primary Care Physican: Christain Sacramento, MD Primary [place] Electrophysiologist: Faustino Congress Weight:unknown Bi-V Pacing: 96.6%      Attempted call to patient and unable to reach.  Left detailed message regarding transmission.  Transmission reviewed.    Thoracic impedance normal but was abnormal from 04/25/2017 to 06/02/2017 suggesting fluid accumulation.  Prescribed dosage: Furosemide 40 mg 1 tablet daily. Potassium 20 mEq 1 tablet daily  Labs: 05/23/2017 Creatinine 1.33, BUN 20, Potassium 3.5, Sodium 144, EGFR 54-63 05/03/2017 Creatinine 1.72, BUN 28, Potassium 4.0, Sodium 144, EGFR 40-46 04/13/2017 Creatinine 1.43, BUN 16, Potassium 3.4, Sodium 141, EGFR 48-56 04/11/2017 Creatinine 1.33, BUN 20, Potassium 3.8, Sodium 146, EGFR 53->60  04/04/2017 Creatinine 1.45, BUN 28, Potassium 4.0, Sodium 142, EGFR 48-55  01/18/2017 Creatinine 1.78, BUN 31, Potassium 4.0, Sodium 144, EGFR 38-44  12/21/2016 Creatinine 1.63, BUN 30, Potassium 4.0, Sodium 145 12/07/2016 Creatinine 1.71, BUN 39, Potassium 4.1, Sodium 141 11/25/2016 Creatinine 1.76, BUN 29, Potassium 4.2, Sodium 144 08/11/2016 Creatinine 1.59, BUN 34, Potassium 3.9, Sodium 141 05/12/2016 Creatinine 1.27, BUN 22, Potassium 3.5, Sodium 144 05/05/2016 Creatinine 1.22, BUN 17, Potassium 3.2, Sodium 143 03/14/2016 Creatinine 1.39, BUN 25, Potassium 3.4, Sodium 145  Recommendations: Left voice mail with ICM number and encouraged to call for fluid symptoms.  Follow-up plan: ICM clinic phone appointment on 07/14/2017.    Copy of ICM check sent to device physician.   3 month ICM trend: 06/13/2017   1 Year ICM trend:      [name], RN 06/13/2017 11:28 AM

## 2017-06-16 ENCOUNTER — Telehealth: Payer: Self-pay | Admitting: Internal Medicine

## 2017-06-16 NOTE — Telephone Encounter (Signed)
New message    Levada Dy from [facility] is calling about the pt. She has a question about pt echo that was sent to them.

## 2017-06-16 NOTE — Telephone Encounter (Signed)
I left a message for [name] to call.

## 2017-06-29 ENCOUNTER — Telehealth: Payer: Self-pay | Admitting: Cardiology

## 2017-06-29 ENCOUNTER — Other Ambulatory Visit: Payer: Self-pay

## 2017-06-29 MED ORDER — METOPROLOL SUCCINATE ER 25 MG PO TB24: 25.0000 mg | ORAL_TABLET | Freq: Every day | ORAL | 0 refills | Status: DC

## 2017-06-29 NOTE — Telephone Encounter (Signed)
New message       *STAT* If patient is at the pharmacy, call can be transferred to refill team.   1. Which medications need to be refilled? (please list name of each medication and dose if known)  Metoprolol 25mg  2. Which pharmacy/location (including street and city if local pharmacy) is medication to be sent to? [phone]  3. Do they need a 30 day or 90 day supply? [id number] Pharmacy has a question before they can fill medication

## 2017-07-05 ENCOUNTER — Telehealth: Payer: Self-pay | Admitting: Cardiology

## 2017-07-05 NOTE — Telephone Encounter (Signed)
New message    Audelia Acton from [facility] is calling about pt metoprolol. She said pt was taking metoprolol tartrate 25 mg 1 bid and a new prescription metoprolol succinate ER 25 mg 1 at noon. She is calling to find out if this is correct.

## 2017-07-05 NOTE — Telephone Encounter (Signed)
Returned call to [facility]-verified correct rx.

## 2017-07-14 ENCOUNTER — Ambulatory Visit (INDEPENDENT_AMBULATORY_CARE_PROVIDER_SITE_OTHER): Payer: Medicare Other

## 2017-07-14 DIAGNOSIS — I5022 Chronic systolic (congestive) heart failure: Secondary | ICD-10-CM

## 2017-07-14 DIAGNOSIS — Z95810 Presence of automatic (implantable) cardiac defibrillator: Secondary | ICD-10-CM | POA: Diagnosis not present

## 2017-07-14 NOTE — Progress Notes (Signed)
EPIC Encounter for ICM Monitoring  [name] is a 71 y.o. male Date: 07/14/2017 Primary Care Physican: Christain Sacramento, MD Primary [place] Electrophysiologist: Faustino Congress Weight:unknown Bi-V Pacing: 97.2%   Clinical Status (13-Jun-2017 to 14-Jul-2017) Treated VT/VF 0 episodes  AT/AF 5 episodes Time in AT/AF <0.1 hr/day (0.2%)      Spoke with wife.  She reported patient is feeling fine and denied any fluid symptoms   Thoracic impedance normal but was abnormal suggesting fluid accumulation 06/15/2017 to 07/09/2017.  Prescribed dosage: Furosemide 40 mg 1 tablet daily. Potassium 20 mEq 1 tablet daily  Labs: 05/23/2017 Creatinine 1.33, BUN 20, Potassium 3.5, Sodium 144, EGFR 54-63 05/03/2017 Creatinine 1.72, BUN 28, Potassium 4.0, Sodium 144, EGFR 40-46 04/13/2017 Creatinine 1.43, BUN 16, Potassium 3.4, Sodium 141, EGFR 48-56 04/11/2017 Creatinine 1.33, BUN 20, Potassium 3.8, Sodium 146, EGFR 53->60  04/04/2017 Creatinine 1.45, BUN 28, Potassium 4.0, Sodium 142, EGFR 48-55  01/18/2017 Creatinine 1.78, BUN 31, Potassium 4.0, Sodium 144, EGFR 38-44  12/21/2016 Creatinine 1.63, BUN 30, Potassium 4.0, Sodium 145 12/07/2016 Creatinine 1.71, BUN 39, Potassium 4.1, Sodium 141 11/25/2016 Creatinine 1.76, BUN 29, Potassium 4.2, Sodium 144 08/11/2016 Creatinine 1.59, BUN 34, Potassium 3.9, Sodium 141 05/12/2016 Creatinine 1.27, BUN 22, Potassium 3.5, Sodium 144 05/05/2016 Creatinine 1.22, BUN 17, Potassium 3.2, Sodium 143 03/14/2016 Creatinine 1.39, BUN 25, Potassium 3.4, Sodium 145  Recommendations: No changes.    Encouraged to call for fluid symptoms.  Follow-up plan: ICM clinic phone appointment on 08/14/2017.    Copy of ICM check sent to device physician.   3 month ICM trend: 07/14/2017   1 Year ICM trend:      [name], RN 07/14/2017 9:07 AM

## 2017-08-08 ENCOUNTER — Telehealth: Payer: Self-pay | Admitting: Cardiology

## 2017-08-08 NOTE — Telephone Encounter (Signed)
Dr.Jordan's 06/01/17 office note faxed to Whitney at [facility] heart failure clinic at fax # [phone].

## 2017-08-08 NOTE — Telephone Encounter (Signed)
New message       Calling to get updated bp, heart rate, ejection fraction and date of last echo.  Please fax info to [phone]

## 2017-08-14 ENCOUNTER — Ambulatory Visit (INDEPENDENT_AMBULATORY_CARE_PROVIDER_SITE_OTHER): Payer: Medicare Other | Admitting: *Deleted

## 2017-08-14 DIAGNOSIS — I255 Ischemic cardiomyopathy: Secondary | ICD-10-CM | POA: Diagnosis not present

## 2017-08-14 DIAGNOSIS — I5022 Chronic systolic (congestive) heart failure: Secondary | ICD-10-CM | POA: Diagnosis not present

## 2017-08-14 DIAGNOSIS — Z95810 Presence of automatic (implantable) cardiac defibrillator: Secondary | ICD-10-CM | POA: Diagnosis not present

## 2017-08-14 NOTE — Progress Notes (Signed)
Remote ICD transmission.   

## 2017-08-15 ENCOUNTER — Telehealth: Payer: Self-pay

## 2017-08-15 LAB — CUP PACEART REMOTE DEVICE CHECK
Battery Remaining Longevity: 28 mo
Battery Voltage: 2.95 V
Brady Statistic AS VS Percent: 1.27 %
Brady Statistic RA Percent Paced: 0 %
Brady Statistic RV Percent Paced: 98 %
Date Time Interrogation Session: 20180827083005
HighPow Impedance: 50 Ohm
HighPow Impedance: 57 Ohm
Implantable Lead Implant Date: 20090226
Implantable Lead Implant Date: 20090226
Implantable Lead Model: 4194
Implantable Lead Model: 6947
Implantable Pulse Generator Implant Date: 20140929
Lead Channel Impedance Value: 323 Ohm
Lead Channel Pacing Threshold Amplitude: 0.625 V
Lead Channel Sensing Intrinsic Amplitude: 12 mV
Lead Channel Sensing Intrinsic Amplitude: 16.875 mV
Lead Channel Setting Pacing Amplitude: 1.75 V
Lead Channel Setting Pacing Amplitude: 2.5 V
Lead Channel Setting Pacing Pulse Width: 0.6 ms
Lead Channel Setting Pacing Pulse Width: 0.6 ms
MDC IDC LEAD IMPLANT DT: 20090226
MDC IDC LEAD LOCATION: 753858
MDC IDC LEAD LOCATION: 753859
MDC IDC LEAD LOCATION: 753860
MDC IDC MSMT LEADCHNL LV IMPEDANCE VALUE: 285 Ohm
MDC IDC MSMT LEADCHNL LV IMPEDANCE VALUE: 570 Ohm
MDC IDC MSMT LEADCHNL LV IMPEDANCE VALUE: 760 Ohm
MDC IDC MSMT LEADCHNL LV PACING THRESHOLD PULSEWIDTH: 0.6 ms
MDC IDC MSMT LEADCHNL RA IMPEDANCE VALUE: 513 Ohm
MDC IDC MSMT LEADCHNL RA SENSING INTR AMPL: 3.5 mV
MDC IDC MSMT LEADCHNL RA SENSING INTR AMPL: 3.5 mV
MDC IDC MSMT LEADCHNL RV IMPEDANCE VALUE: 380 Ohm
MDC IDC SET LEADCHNL RV SENSING SENSITIVITY: 0.3 mV
MDC IDC STAT BRADY AP VP PERCENT: 0 %
MDC IDC STAT BRADY AP VS PERCENT: 0 %
MDC IDC STAT BRADY AS VP PERCENT: 98.73 %

## 2017-08-15 NOTE — Telephone Encounter (Signed)
Remote ICM transmission received.  Attempted call to patient and left detailed message regarding transmission and next ICM scheduled for 09/26/2017.  Advised to return call for any fluid symptoms or questions.

## 2017-08-15 NOTE — Progress Notes (Signed)
EPIC Encounter for ICM Monitoring  [name] is a 70 y.o. male Date: 08/15/2017 Primary Care Physican: Wilson, Fred H, MD Primary Cardiologist:Jordan Electrophysiologist: Klein Dry Weight:unknown Bi-V Pacing: 98%      Attempted call to patient and unable to reach.  Left detailed message regarding transmission.  Transmission reviewed.    Thoracic impedance abnormal suggesting fluid accumulation starting 08/05/2017 but returned to baseline today.  Prescribed dosage: Furosemide 40 mg 1 tablet daily. Potassium 20 mEq 1 tablet daily  Labs: 05/23/2017 Creatinine 1.33, BUN 20, Potassium 3.5, Sodium 144, EGFR 54-63 05/03/2017 Creatinine 1.72, BUN 28, Potassium 4.0, Sodium 144, EGFR 40-46 04/13/2017 Creatinine 1.43, BUN 16, Potassium 3.4, Sodium 141, EGFR 48-56 04/11/2017 Creatinine 1.33, BUN 20, Potassium 3.8, Sodium 146, EGFR 53->60  04/04/2017 Creatinine 1.45, BUN 28, Potassium 4.0, Sodium 142, EGFR 48-55  01/18/2017 Creatinine 1.78, BUN 31, Potassium 4.0, Sodium 144, EGFR 38-44  12/21/2016 Creatinine 1.63, BUN 30, Potassium 4.0, Sodium 145 12/07/2016 Creatinine 1.71, BUN 39, Potassium 4.1, Sodium 141 11/25/2016 Creatinine 1.76, BUN 29, Potassium 4.2, Sodium 144 08/11/2016 Creatinine 1.59, BUN 34, Potassium 3.9, Sodium 141 05/12/2016 Creatinine 1.27, BUN 22, Potassium 3.5, Sodium 144 05/05/2016 Creatinine 1.22, BUN 17, Potassium 3.2, Sodium 143 03/14/2016 Creatinine 1.39, BUN 25, Potassium 3.4, Sodium 145  Recommendations: Left voice mail with ICM number and encouraged to call for fluid symptoms.  Follow-up plan: ICM clinic phone appointment on 09/26/2017.    Copy of ICM check sent to Dr. Klein.   3 month ICM trend: 08/14/2017   1 Year ICM trend:       S , RN 08/15/2017 1:51 PM    

## 2017-08-23 ENCOUNTER — Other Ambulatory Visit: Payer: Self-pay | Admitting: Cardiology

## 2017-08-25 ENCOUNTER — Encounter: Payer: Self-pay | Admitting: Cardiology

## 2017-09-26 ENCOUNTER — Telehealth: Payer: Self-pay | Admitting: Cardiology

## 2017-09-26 ENCOUNTER — Ambulatory Visit (INDEPENDENT_AMBULATORY_CARE_PROVIDER_SITE_OTHER): Payer: Medicare Other

## 2017-09-26 DIAGNOSIS — Z95810 Presence of automatic (implantable) cardiac defibrillator: Secondary | ICD-10-CM

## 2017-09-26 DIAGNOSIS — I5022 Chronic systolic (congestive) heart failure: Secondary | ICD-10-CM

## 2017-09-26 NOTE — Telephone Encounter (Signed)
LMOVM reminding pt to send remote transmission.   

## 2017-09-29 ENCOUNTER — Inpatient Hospital Stay (HOSPITAL_COMMUNITY)
Admission: EM | Admit: 2017-09-29 | Discharge: 2017-10-02 | DRG: 683 | Disposition: A | Discharge status: Home / Self Care | Payer: Medicare Other | Attending: Internal Medicine | Admitting: Internal Medicine

## 2017-09-29 ENCOUNTER — Encounter (HOSPITAL_COMMUNITY): Payer: Self-pay | Admitting: *Deleted

## 2017-09-29 ENCOUNTER — Emergency Department (HOSPITAL_COMMUNITY): Payer: Medicare Other

## 2017-09-29 ENCOUNTER — Telehealth: Payer: Self-pay

## 2017-09-29 DIAGNOSIS — N189 Chronic kidney disease, unspecified: Secondary | ICD-10-CM

## 2017-09-29 DIAGNOSIS — Z87891 Personal history of nicotine dependence: Secondary | ICD-10-CM

## 2017-09-29 DIAGNOSIS — Z955 Presence of coronary angioplasty implant and graft: Secondary | ICD-10-CM

## 2017-09-29 DIAGNOSIS — I739 Peripheral vascular disease, unspecified: Secondary | ICD-10-CM | POA: Diagnosis present

## 2017-09-29 DIAGNOSIS — Z7901 Long term (current) use of anticoagulants: Secondary | ICD-10-CM

## 2017-09-29 DIAGNOSIS — I10 Essential (primary) hypertension: Secondary | ICD-10-CM | POA: Diagnosis present

## 2017-09-29 DIAGNOSIS — F329 Major depressive disorder, single episode, unspecified: Secondary | ICD-10-CM | POA: Diagnosis present

## 2017-09-29 DIAGNOSIS — I482 Chronic atrial fibrillation, unspecified: Secondary | ICD-10-CM | POA: Diagnosis present

## 2017-09-29 DIAGNOSIS — R509 Fever, unspecified: Secondary | ICD-10-CM

## 2017-09-29 DIAGNOSIS — N289 Disorder of kidney and ureter, unspecified: Secondary | ICD-10-CM

## 2017-09-29 DIAGNOSIS — K219 Gastro-esophageal reflux disease without esophagitis: Secondary | ICD-10-CM | POA: Diagnosis present

## 2017-09-29 DIAGNOSIS — A419 Sepsis, unspecified organism: Secondary | ICD-10-CM | POA: Diagnosis present

## 2017-09-29 DIAGNOSIS — Z95810 Presence of automatic (implantable) cardiac defibrillator: Secondary | ICD-10-CM | POA: Diagnosis present

## 2017-09-29 DIAGNOSIS — Z79899 Other long term (current) drug therapy: Secondary | ICD-10-CM

## 2017-09-29 DIAGNOSIS — Z86718 Personal history of other venous thrombosis and embolism: Secondary | ICD-10-CM

## 2017-09-29 DIAGNOSIS — I2510 Atherosclerotic heart disease of native coronary artery without angina pectoris: Secondary | ICD-10-CM | POA: Diagnosis present

## 2017-09-29 DIAGNOSIS — E860 Dehydration: Secondary | ICD-10-CM | POA: Diagnosis present

## 2017-09-29 DIAGNOSIS — I272 Pulmonary hypertension, unspecified: Secondary | ICD-10-CM | POA: Diagnosis present

## 2017-09-29 DIAGNOSIS — I252 Old myocardial infarction: Secondary | ICD-10-CM

## 2017-09-29 DIAGNOSIS — I255 Ischemic cardiomyopathy: Secondary | ICD-10-CM | POA: Diagnosis present

## 2017-09-29 DIAGNOSIS — N179 Acute kidney failure, unspecified: Principal | ICD-10-CM | POA: Diagnosis present

## 2017-09-29 DIAGNOSIS — R748 Abnormal levels of other serum enzymes: Secondary | ICD-10-CM | POA: Diagnosis present

## 2017-09-29 DIAGNOSIS — N19 Unspecified kidney failure: Secondary | ICD-10-CM

## 2017-09-29 DIAGNOSIS — I5022 Chronic systolic (congestive) heart failure: Secondary | ICD-10-CM | POA: Diagnosis present

## 2017-09-29 DIAGNOSIS — I130 Hypertensive heart and chronic kidney disease with heart failure and stage 1 through stage 4 chronic kidney disease, or unspecified chronic kidney disease: Secondary | ICD-10-CM | POA: Diagnosis present

## 2017-09-29 DIAGNOSIS — N183 Chronic kidney disease, stage 3 (moderate): Secondary | ICD-10-CM | POA: Diagnosis present

## 2017-09-29 DIAGNOSIS — F419 Anxiety disorder, unspecified: Secondary | ICD-10-CM | POA: Diagnosis present

## 2017-09-29 DIAGNOSIS — E785 Hyperlipidemia, unspecified: Secondary | ICD-10-CM | POA: Diagnosis present

## 2017-09-29 LAB — URINALYSIS, ROUTINE W REFLEX MICROSCOPIC
BILIRUBIN URINE: NEGATIVE
GLUCOSE, UA: NEGATIVE mg/dL
Hgb urine dipstick: NEGATIVE
Ketones, ur: NEGATIVE mg/dL
Leukocytes, UA: NEGATIVE
NITRITE: NEGATIVE
PH: 5 (ref 5.0–8.0)
Protein, ur: NEGATIVE mg/dL
SPECIFIC GRAVITY, URINE: 1.013 (ref 1.005–1.030)

## 2017-09-29 LAB — CBC
HEMATOCRIT: 35.8 % — AB (ref 39.0–52.0)
HEMOGLOBIN: 11.9 g/dL — AB (ref 13.0–17.0)
MCH: 27.2 pg (ref 26.0–34.0)
MCHC: 33.2 g/dL (ref 30.0–36.0)
MCV: 81.7 fL (ref 78.0–100.0)
Platelets: 153 10*3/uL (ref 150–400)
RBC: 4.38 MIL/uL (ref 4.22–5.81)
RDW: 14.6 % (ref 11.5–15.5)
WBC: 16.6 10*3/uL — ABNORMAL HIGH (ref 4.0–10.5)

## 2017-09-29 LAB — BASIC METABOLIC PANEL
ANION GAP: 13 (ref 5–15)
BUN: 44 mg/dL — ABNORMAL HIGH (ref 6–20)
CHLORIDE: 102 mmol/L (ref 101–111)
CO2: 21 mmol/L — AB (ref 22–32)
Calcium: 8.8 mg/dL — ABNORMAL LOW (ref 8.9–10.3)
Creatinine, Ser: 2.71 mg/dL — ABNORMAL HIGH (ref 0.61–1.24)
GFR calc Af Amer: 26 mL/min — ABNORMAL LOW (ref >60)
GFR, EST NON AFRICAN AMERICAN: 22 mL/min — AB (ref >60)
GLUCOSE: 101 mg/dL — AB (ref 65–99)
POTASSIUM: 3.7 mmol/L (ref 3.5–5.1)
Sodium: 136 mmol/L (ref 135–145)

## 2017-09-29 LAB — TROPONIN I: Troponin I: 0.08 ng/mL (ref <0.03)

## 2017-09-29 LAB — HEPATIC FUNCTION PANEL
ALK PHOS: 49 U/L (ref 38–126)
ALT: 20 U/L (ref 17–63)
AST: 42 U/L — ABNORMAL HIGH (ref 15–41)
Albumin: 3.3 g/dL — ABNORMAL LOW (ref 3.5–5.0)
BILIRUBIN DIRECT: 0.5 mg/dL (ref 0.1–0.5)
BILIRUBIN INDIRECT: 1 mg/dL — AB (ref 0.3–0.9)
BILIRUBIN TOTAL: 1.5 mg/dL — AB (ref 0.3–1.2)
TOTAL PROTEIN: 6 g/dL — AB (ref 6.5–8.1)

## 2017-09-29 LAB — LIPASE, BLOOD: LIPASE: 23 U/L (ref 11–51)

## 2017-09-29 LAB — I-STAT CG4 LACTIC ACID, ED: LACTIC ACID, VENOUS: 2.14 mmol/L — AB (ref 0.5–1.9)

## 2017-09-29 LAB — PROTIME-INR
INR: 2.69
Prothrombin Time: 28.4 seconds — ABNORMAL HIGH (ref 11.4–15.2)

## 2017-09-29 LAB — DIGOXIN LEVEL: Digoxin Level: <0.2 ng/mL — ABNORMAL LOW (ref 0.8–2.0)

## 2017-09-29 MED ORDER — SODIUM CHLORIDE 0.9 % IV BOLUS (SEPSIS)
500.0000 mL | Freq: Once | INTRAVENOUS | Status: AC
  Administered 2017-09-29: 500 mL via INTRAVENOUS

## 2017-09-29 NOTE — Telephone Encounter (Signed)
Remote ICM transmission received.  Attempted call to patient and left detailed message regarding transmission and next ICM scheduled for 10/30/2017.  Advised to return call for any fluid symptoms or questions.    

## 2017-09-29 NOTE — ED Notes (Signed)
Pt getting nss

## 2017-09-29 NOTE — Progress Notes (Signed)
EPIC Encounter for ICM Monitoring  [name] is a 71 y.o. male Date: 09/29/2017 Primary Care Physican: Christain Sacramento, MD Primary [place] Electrophysiologist: Faustino Congress Weight:unknown Bi-V Pacing: 97.6%  Since 14-Aug-2017 AT/AF      51 Time in AT/AF 0.2 hr/day (1.0%) Longest AT/AF 25 minutes           Attempted call to patient and unable to reach.  Left detailed message regarding transmission.  Transmission reviewed.    Thoracic impedance normal.  Prescribed dosage: Furosemide 40 mg 1 tablet daily. Potassium 20 mEq 1 tablet daily  Labs: 05/23/2017 Creatinine 1.33, BUN 20, Potassium 3.5, Sodium 144, EGFR 54-63 05/03/2017 Creatinine 1.72, BUN 28, Potassium 4.0, Sodium 144, EGFR 40-46 04/13/2017 Creatinine 1.43, BUN 16, Potassium 3.4, Sodium 141, EGFR 48-56 04/11/2017 Creatinine 1.33, BUN 20, Potassium 3.8, Sodium 146, EGFR 53->60  04/04/2017 Creatinine 1.45, BUN 28, Potassium 4.0, Sodium 142, EGFR 48-55  01/18/2017 Creatinine 1.78, BUN 31, Potassium 4.0, Sodium 144, EGFR 38-44  12/21/2016 Creatinine 1.63, BUN 30, Potassium 4.0, Sodium 145 12/07/2016 Creatinine 1.71, BUN 39, Potassium 4.1, Sodium 141 11/25/2016 Creatinine 1.76, BUN 29, Potassium 4.2, Sodium 144 08/11/2016 Creatinine 1.59, BUN 34, Potassium 3.9, Sodium 141 05/12/2016 Creatinine 1.27, BUN 22, Potassium 3.5, Sodium 144 05/05/2016 Creatinine 1.22, BUN 17, Potassium 3.2, Sodium 143 03/14/2016 Creatinine 1.39, BUN 25, Potassium 3.4, Sodium 145  Recommendations: Left voice mail with ICM number and encouraged to call if experiencing any fluid symptoms.  Follow-up plan: ICM clinic phone appointment on 10/30/2017.  Office appointment scheduled 11/20/2017 with Dr. Caryl Comes.  Copy of ICM check sent to Dr. Caryl Comes.   3 month ICM trend: 09/28/2017   1 Year ICM trend:      [name], RN 09/29/2017 11:12 AM

## 2017-09-29 NOTE — ED Triage Notes (Signed)
The pt arrived by gems from home he finished mowing and sat down in the house then went back outside to put up his lawn mower   He began to cough  Then he developed wooziness  Ems was called by his wife who felt like his speech was different.  Speech normal  To ems  The pt has no pain just c/o  Dizziness.  Alert oriented skin warm and dry  No distress    No pain anywhere

## 2017-09-29 NOTE — ED Notes (Signed)
53ml nss bolus started

## 2017-09-29 NOTE — ED Provider Notes (Signed)
[facility] DEPT Provider Note   CSN: [id number] Arrival date & time: 09/29/17  2058     History   Chief Complaint Chief Complaint  Patient presents with  . Dizziness    HPI [name] is a 71 y.o. male.  HPI patient presented with generalized weakness. States he had been out mowing the lawn with his Best boy. States he began feeling more short of breath and generally weak but follow through. We got inside he was somewhat lightheaded and reportedly had a little slurred speech. Over the last couple days he has had some URI symptoms and was given zinc because of it. States that that has dehydrated him. He has a history of respiratory symptoms about a month ago also. Has a history of congestive heart failure and has an EF of around 25%. No dysuria. Has had nausea with somewhat decreased oral intake also over the last few days. No diarrhea. No abdominal pain. No rash. Some mild sinus congestion and occasional cough without real sputum production.  Past Medical History:  Diagnosis Date  . AICD (automatic cardioverter/defibrillator) present   . Anemia   . Anxiety   . CHF (congestive heart failure) ([place])   . Chronic renal insufficiency   . Coronary artery disease   . Depression   . Dyslipidemia   . Dysrhythmia    atrial fibrillation  . ED (erectile dysfunction)   . GERD (gastroesophageal reflux disease)   . Incisional hernia, without obstruction or gangrene 04/10/2017  . Ischemic cardiomyopathy    EF 23%  . MI (myocardial infarction) ([place]) 2000   ANTERIOR, s/p PCI  . Other primary cardiomyopathies   . Peripheral vascular disease ([place])    dvt legs 2017  . Persistent atrial fibrillation (HCC)    on coumadin  . Presence of permanent cardiac pacemaker   . Shortness of breath dyspnea    on exertion    Patient Active Problem List   Diagnosis Date Noted  . Incisional hernia, without obstruction or gangrene 04/10/2017  . Incisional hernia without obstruction or  gangrene 04/10/2017  . Edema extremities 06/14/2016  . Urinary retention   . Hyperglycemia   . Atrial fibrillation with RVR ([place])   . Cardiomyopathy, ischemic-EF 20-25% 02/22/2016  . Chronic anticoagulation-Couamdin 02/22/2016  . Epigastric pain 02/22/2016  . Hypotension 02/18/2016  . CKD (chronic kidney disease) stage 3, GFR 30-59 ml/min (HCC) 02/18/2016  . BPH- s/p TURP 01/25/16 - followed by 4 weeks of Cipro 01/25/2016  . Nonischemic cardiomyopathy ([place])   . Noncompliance 06/08/2011  . 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 Sleep apnea 04/05/2011  . DYSLIPIDEMIA 03/09/2010  . Essential hypertension 03/09/2010  . CAD S/P LAD PCI- 2000 03/09/2010  . Chronic systolic CHF (congestive heart failure) ([place]) 03/09/2010    Past Surgical History:  Procedure Laterality Date  . AV NODE ABLATION N/A 01/25/2017   Procedure: AV Node Ablation;  Surgeon: [name], MD;  Location: [facility];  Service: Cardiovascular;  Laterality: N/A;  . AV NODE ABLATION  01/25/2017  . BIV ICD GENERTAOR CHANGE OUT N/A 09/16/2013   Procedure: BIV ICD GENERTAOR CHANGE OUT;  Surgeon: [name], MD;  Location: [facility] CATH LAB;  Service: Cardiovascular;  Laterality: N/A;  . BREAST LUMPECTOMY Left   . CARDIAC CATHETERIZATION  02/05/2008   MODERATE TO SEVERE LEFT VENTRICULAR  DYSFUNCTION WITH EF 25-30%  . CARDIAC DEFIBRILLATOR PLACEMENT  2009   MDT BI-VENTRICULAR ICD by Dr Caryl Comes  . COLONOSCOPY WITH PROPOFOL  N/A 08/31/2016   Procedure: COLONOSCOPY WITH PROPOFOL;  Surgeon: Wilford Corner, MD;  Location: [facility] ENDOSCOPY;  Service: Endoscopy;  Laterality: N/A;  . CORONARY ANGIOPLASTY  2000   stent  . CORONARY STENT PLACEMENT     LAD  . CYSTOSCOPY N/A 01/25/2016   Procedure: CYSTOSCOPY;  Surgeon: Carolan Clines, MD;  Location: [facility] ORS;  Service: Urology;  Laterality: N/A;  . ESOPHAGOGASTRODUODENOSCOPY (EGD) WITH PROPOFOL N/A 08/31/2016   Procedure:  ESOPHAGOGASTRODUODENOSCOPY (EGD) WITH PROPOFOL;  Surgeon: Wilford Corner, MD;  Location: [facility] ENDOSCOPY;  Service: Endoscopy;  Laterality: N/A;  . FRACTURE SURGERY Right 1972   leg  . IMPLANTABLE CARDIOVERTER DEFIBRILLATOR GENERATOR CHANGE  2014  . INCISIONAL HERNIA REPAIR N/A 04/10/2017   Procedure: OPEN REPAIR INCISIONAL VENTRAL HERNIA;  Surgeon: [name], MD;  Location: [facility];  Service: General;  Laterality: N/A;  . INSERT / REPLACE / REMOVE PACEMAKER     ICD  . INSERTION OF MESH N/A 04/10/2017   Procedure: INSERTION OF MESH;  Surgeon: [name], MD;  Location: [facility];  Service: General;  Laterality: N/A;  . LAPAROTOMY N/A 02/22/2016   Procedure: EXPLORATORY LAPAROTOMY WITH  PATCH OF DUODENAL ULCER;  Surgeon: Rolm Bookbinder, MD;  Location: [facility];  Service: General;  Laterality: N/A;  . MULTIPLE TOOTH EXTRACTIONS    . TRANSTHORACIC ECHOCARDIOGRAM  12/2010   EF 30-35%  . TRANSURETHRAL RESECTION OF PROSTATE N/A 01/25/2016   Procedure: TRANSURETHRAL RESECTION OF THE PROSTATE (TURP);  Surgeon: Carolan Clines, MD;  Location: [facility] ORS;  Service: Urology;  Laterality: N/A;  . WRIST FRACTURE SURGERY Left 1972       Home Medications    Prior to Admission medications   Medication Sig Start Date End Date Taking? Authorizing Provider  acetaminophen (TYLENOL) 500 MG tablet Take 500 mg by mouth every 8 (eight) hours as needed for mild pain.   Yes [provider]  digoxin (LANOXIN) 0.125 MG tablet Take 0.125 mg by mouth daily.   Yes [provider]  fenofibrate 160 MG tablet TAKE 1 TABLET BY MOUTH  DAILY Patient taking differently: TAKE 160 mg TABLET BY MOUTH  DAILY 01/02/17  Yes [place], Peter M, MD  ferrous sulfate 325 (65 FE) MG tablet Take 325 mg by mouth 2 (two) times daily.   Yes [provider]  folic acid (FOLVITE) 737 MCG tablet Take 800 mcg by mouth daily.    Yes [provider]  furosemide (LASIX) 40 MG tablet Take 1 tablet (40 mg total) by  mouth daily. 05/09/17  Yes [name], MD  losartan (COZAAR) 25 MG tablet Take 1 tablet (25 mg total) by mouth daily. 01/25/17  Yes Baldwin Jamaica, PA-C  metoprolol succinate (TOPROL-XL) 25 MG 24 hr tablet Take 1 tablet (25 mg total) by mouth daily at 12 noon. 06/29/17  Yes [place], Peter M, MD  Multiple Vitamins-Minerals (MULTIVITAMIN ADULTS PO) Take 1 tablet by mouth daily.   Yes [provider]  nitroGLYCERIN (NITROSTAT) 0.4 MG SL tablet Place 1 tablet (0.4 mg total) under the tongue every 5 (five) minutes as needed for chest pain. 02/20/17 09/29/17 Yes [place], Peter M, MD  pantoprazole (PROTONIX) 40 MG tablet Take 40 mg by mouth daily.    Yes [provider]  pantoprazole (PROTONIX) 40 MG tablet TAKE 2 TABLETS BY MOUTH  DAILY Patient taking differently: TAKE 40 mg TABLETS BY MOUTH  DAILY 06/13/17  Yes [place], Peter M, MD  potassium chloride SA (K-DUR,KLOR-CON) 20 MEQ tablet Take 20 mEq  by mouth daily.   Yes [provider]  pravastatin (PRAVACHOL) 40 MG tablet TAKE 1 TABLET BY MOUTH  DAILY Patient taking differently: TAKE 40 mg TABLET BY MOUTH  DAILY AT BEDTIME 01/02/17  Yes [place], Peter M, MD  sertraline (ZOLOFT) 100 MG tablet Take 100 mg by mouth daily.     Yes [provider]  vitamin C (ASCORBIC ACID) 250 MG tablet Take 250 mg by mouth 2 (two) times daily.   Yes [provider]  warfarin (COUMADIN) 5 MG tablet Take 0.5 tablets (2.5 mg total) by mouth daily at 6 PM. Patient taking differently: Take 2.5 mg by mouth at bedtime. Currently 2.5 mg tablet 7 days weekly 03/02/16  Yes Theodis Blaze, MD  metoprolol succinate (TOPROL-XL) 25 MG 24 hr tablet TAKE 1 TABLET BY MOUTH  DAILY AT 12 NOON. Patient not taking: Reported on 09/29/2017 08/23/17   [place], Peter M, MD    Family History Family History  Problem Relation Age of Onset  . Breast cancer Mother   . Depression Mother   . Heart attack Father   . Breast cancer Sister     Social  History Social History  Substance Use Topics  . Smoking status: Former Smoker    Types: Cigarettes    Quit date: 12/23/1998  . Smokeless tobacco: Never Used  . Alcohol use No     Allergies   Keflex [cephalexin]   Review of Systems Review of Systems  Constitutional: Positive for appetite change and fatigue.  HENT: Positive for congestion.   Respiratory: Positive for cough and shortness of breath.   Cardiovascular: Negative for chest pain.  Gastrointestinal: Positive for nausea and vomiting. Negative for abdominal pain.  Genitourinary: Negative for flank pain.  Musculoskeletal: Negative for back pain.  Neurological: Negative for seizures.  Hematological: Negative for adenopathy.  Psychiatric/Behavioral: Negative for confusion.     Physical Exam Updated Vital Signs BP (!) 105/52   Pulse 70   Temp (!) 102 F (38.9 C)   Resp (!) 23   Ht 5\' 10"  (1.778 m)   Wt 91.6 kg (202 lb)   SpO2 93%   BMI 28.98 kg/m   Physical Exam  Constitutional: He is oriented to person, place, and time. He appears well-developed.  HENT:  Mucous membranes are dry  Neck: Neck supple.  Cardiovascular:  Mildly irregular rhythm  Pulmonary/Chest: Effort normal. He has no wheezes. He has no rales.  Abdominal: There is no tenderness.  Musculoskeletal: He exhibits no tenderness.  Neurological: He is alert and oriented to person, place, and time.  Good grips things bilaterally. Moving all 4 extremities. Face symmetric. Eye movements intact.  Skin: Skin is warm. Capillary refill takes less than 2 seconds.     ED Treatments / Results  Labs (all labs ordered are listed, but only abnormal results are displayed) Labs Reviewed  BASIC METABOLIC PANEL - Abnormal; Notable for the following:       Result Value   CO2 21 (*)    Glucose, Bld 101 (*)    BUN 44 (*)    Creatinine, Ser 2.71 (*)    Calcium 8.8 (*)    GFR calc non Af Amer 22 (*)    GFR calc Af Amer 26 (*)    All other components within  normal limits  CBC - Abnormal; Notable for the following:    WBC 16.6 (*)    Hemoglobin 11.9 (*)    HCT 35.8 (*)    All other  components within normal limits  URINALYSIS, ROUTINE W REFLEX MICROSCOPIC - Abnormal; Notable for the following:    Color, Urine AMBER (*)    APPearance HAZY (*)    All other components within normal limits  PROTIME-INR - Abnormal; Notable for the following:    Prothrombin Time 28.4 (*)    All other components within normal limits  HEPATIC FUNCTION PANEL - Abnormal; Notable for the following:    Total Protein 6.0 (*)    Albumin 3.3 (*)    AST 42 (*)    Total Bilirubin 1.5 (*)    Indirect Bilirubin 1.0 (*)    All other components within normal limits  TROPONIN I - Abnormal; Notable for the following:    Troponin I 0.08 (*)    All other components within normal limits  DIGOXIN LEVEL - Abnormal; Notable for the following:    Digoxin Level <0.2 (*)    All other components within normal limits  I-STAT CG4 LACTIC ACID, ED - Abnormal; Notable for the following:    Lactic Acid, Venous 2.14 (*)    All other components within normal limits  CULTURE, BLOOD (ROUTINE X 2)  CULTURE, BLOOD (ROUTINE X 2)  LIPASE, BLOOD  INFLUENZA PANEL BY PCR (TYPE A & B)  CBG MONITORING, ED  I-STAT CG4 LACTIC ACID, ED    EKG  EKG Interpretation  Date/Time:  Friday September 29 2017 21:09:02 EDT Ventricular Rate:  71 PR Interval:    QRS Duration: 150 QT Interval:  445 QTC Calculation: 484 R Axis:   -109 Text Interpretation:  Atrial fibrillation Ventricular premature complex Right bundle branch block Inferior infarct, old Abnormal lateral Q waves Anterior infarct, old ventricular pacing also Confirmed by Davonna Belling [phone]) on 09/29/2017 11:06:43 PM       Radiology Dg Chest 2 View  Result Date: 09/29/2017 CLINICAL DATA:  Cough, congestion, shortness of breath. EXAM: CHEST  2 VIEW COMPARISON:  02/24/2016 FINDINGS: Stable in position cardiac pacemaker. Enlarged cardiac  silhouette. Mediastinal contours appear intact. Calcific atherosclerotic disease and tortuosity of the aorta. There is no evidence of focal airspace consolidation, pleural effusion or pneumothorax. Osseous structures are without acute abnormality. Soft tissues are grossly normal. IMPRESSION: Enlarged cardiac silhouette. Calcific atherosclerotic disease and tortuosity of the aorta. Electronically Signed   By: Fidela Salisbury M.D.   On: 09/29/2017 22:14    Procedures Procedures (including critical care time)  Medications Ordered in ED Medications  sodium chloride 0.9 % bolus 500 mL (0 mLs Intravenous Stopped 09/29/17 2323)     Initial Impression / Assessment and Plan / ED Course  I have reviewed the triage vital signs and the nursing notes.  Pertinent labs & imaging results that were available during my care of the patient were reviewed by me and considered in my medical decision making (see chart for details).     Patient with fever and generalized weakness. Has had some URI symptoms but does not show pneumonia on x-ray. Lungs are clear. Urine does not show infection. Has also has had some nausea and vomiting. He has baseline renal insufficiency and also a cardiomyopathy. States she's had decreased oral intake. Judicious fluid boluses been given. Initial lactic acid only minimally elevated and not greater than 4. Think his lactic acidosis likely elevated and his renal function elevated due to dehydration as opposed to a septic shock/severe sepsis influenza test pending. Will admit to hospitalist. . Final Clinical Impressions(s) / ED Diagnoses   Final diagnoses:  Renal insufficiency  Fever,  unspecified fever cause    New Prescriptions New Prescriptions   No medications on file     Davonna Belling, MD 09/30/17 [phone]

## 2017-09-30 ENCOUNTER — Inpatient Hospital Stay (HOSPITAL_COMMUNITY): Payer: Medicare Other

## 2017-09-30 ENCOUNTER — Encounter (HOSPITAL_COMMUNITY): Payer: Self-pay

## 2017-09-30 DIAGNOSIS — R6510 Systemic inflammatory response syndrome (SIRS) of non-infectious origin without acute organ dysfunction: Secondary | ICD-10-CM | POA: Diagnosis not present

## 2017-09-30 DIAGNOSIS — R509 Fever, unspecified: Secondary | ICD-10-CM | POA: Diagnosis present

## 2017-09-30 DIAGNOSIS — I2510 Atherosclerotic heart disease of native coronary artery without angina pectoris: Secondary | ICD-10-CM | POA: Diagnosis present

## 2017-09-30 DIAGNOSIS — I255 Ischemic cardiomyopathy: Secondary | ICD-10-CM | POA: Diagnosis present

## 2017-09-30 DIAGNOSIS — I739 Peripheral vascular disease, unspecified: Secondary | ICD-10-CM | POA: Diagnosis present

## 2017-09-30 DIAGNOSIS — N189 Chronic kidney disease, unspecified: Secondary | ICD-10-CM

## 2017-09-30 DIAGNOSIS — N183 Chronic kidney disease, stage 3 (moderate): Secondary | ICD-10-CM | POA: Diagnosis present

## 2017-09-30 DIAGNOSIS — E860 Dehydration: Secondary | ICD-10-CM | POA: Diagnosis present

## 2017-09-30 DIAGNOSIS — I361 Nonrheumatic tricuspid (valve) insufficiency: Secondary | ICD-10-CM | POA: Diagnosis not present

## 2017-09-30 DIAGNOSIS — Z7901 Long term (current) use of anticoagulants: Secondary | ICD-10-CM | POA: Diagnosis not present

## 2017-09-30 DIAGNOSIS — I482 Chronic atrial fibrillation: Secondary | ICD-10-CM | POA: Diagnosis not present

## 2017-09-30 DIAGNOSIS — E785 Hyperlipidemia, unspecified: Secondary | ICD-10-CM | POA: Diagnosis present

## 2017-09-30 DIAGNOSIS — Z95810 Presence of automatic (implantable) cardiac defibrillator: Secondary | ICD-10-CM | POA: Diagnosis not present

## 2017-09-30 DIAGNOSIS — F329 Major depressive disorder, single episode, unspecified: Secondary | ICD-10-CM | POA: Diagnosis present

## 2017-09-30 DIAGNOSIS — I5022 Chronic systolic (congestive) heart failure: Secondary | ICD-10-CM | POA: Diagnosis present

## 2017-09-30 DIAGNOSIS — I272 Pulmonary hypertension, unspecified: Secondary | ICD-10-CM | POA: Diagnosis present

## 2017-09-30 DIAGNOSIS — I340 Nonrheumatic mitral (valve) insufficiency: Secondary | ICD-10-CM | POA: Diagnosis not present

## 2017-09-30 DIAGNOSIS — Z79899 Other long term (current) drug therapy: Secondary | ICD-10-CM | POA: Diagnosis not present

## 2017-09-30 DIAGNOSIS — N179 Acute kidney failure, unspecified: Secondary | ICD-10-CM | POA: Diagnosis not present

## 2017-09-30 DIAGNOSIS — A419 Sepsis, unspecified organism: Secondary | ICD-10-CM | POA: Diagnosis not present

## 2017-09-30 DIAGNOSIS — Z86718 Personal history of other venous thrombosis and embolism: Secondary | ICD-10-CM | POA: Diagnosis not present

## 2017-09-30 DIAGNOSIS — Z87891 Personal history of nicotine dependence: Secondary | ICD-10-CM | POA: Diagnosis not present

## 2017-09-30 DIAGNOSIS — I10 Essential (primary) hypertension: Secondary | ICD-10-CM | POA: Diagnosis not present

## 2017-09-30 DIAGNOSIS — R748 Abnormal levels of other serum enzymes: Secondary | ICD-10-CM | POA: Diagnosis present

## 2017-09-30 DIAGNOSIS — K219 Gastro-esophageal reflux disease without esophagitis: Secondary | ICD-10-CM | POA: Diagnosis present

## 2017-09-30 DIAGNOSIS — I130 Hypertensive heart and chronic kidney disease with heart failure and stage 1 through stage 4 chronic kidney disease, or unspecified chronic kidney disease: Secondary | ICD-10-CM | POA: Diagnosis present

## 2017-09-30 DIAGNOSIS — F419 Anxiety disorder, unspecified: Secondary | ICD-10-CM | POA: Diagnosis present

## 2017-09-30 DIAGNOSIS — I252 Old myocardial infarction: Secondary | ICD-10-CM | POA: Diagnosis not present

## 2017-09-30 DIAGNOSIS — Z955 Presence of coronary angioplasty implant and graft: Secondary | ICD-10-CM | POA: Diagnosis not present

## 2017-09-30 LAB — RESPIRATORY PANEL BY PCR
Adenovirus: NOT DETECTED
BORDETELLA PERTUSSIS-RVPCR: NOT DETECTED
CHLAMYDOPHILA PNEUMONIAE-RVPPCR: NOT DETECTED
CORONAVIRUS 229E-RVPPCR: NOT DETECTED
CORONAVIRUS OC43-RVPPCR: NOT DETECTED
Coronavirus HKU1: NOT DETECTED
Coronavirus NL63: NOT DETECTED
INFLUENZA B-RVPPCR: NOT DETECTED
Influenza A: NOT DETECTED
MYCOPLASMA PNEUMONIAE-RVPPCR: NOT DETECTED
Metapneumovirus: NOT DETECTED
Parainfluenza Virus 1: NOT DETECTED
Parainfluenza Virus 2: NOT DETECTED
Parainfluenza Virus 3: NOT DETECTED
Parainfluenza Virus 4: NOT DETECTED
Respiratory Syncytial Virus: NOT DETECTED
Rhinovirus / Enterovirus: NOT DETECTED

## 2017-09-30 LAB — CBC
HCT: 34.8 % — ABNORMAL LOW (ref 39.0–52.0)
Hemoglobin: 11.1 g/dL — ABNORMAL LOW (ref 13.0–17.0)
MCH: 26.5 pg (ref 26.0–34.0)
MCHC: 31.9 g/dL (ref 30.0–36.0)
MCV: 83.1 fL (ref 78.0–100.0)
PLATELETS: 136 10*3/uL — AB (ref 150–400)
RBC: 4.19 MIL/uL — ABNORMAL LOW (ref 4.22–5.81)
RDW: 15 % (ref 11.5–15.5)
WBC: 25.7 10*3/uL — ABNORMAL HIGH (ref 4.0–10.5)

## 2017-09-30 LAB — PROTIME-INR
INR: 2.6
PROTHROMBIN TIME: 27.6 s — AB (ref 11.4–15.2)

## 2017-09-30 LAB — COMPREHENSIVE METABOLIC PANEL
ALK PHOS: 36 U/L — AB (ref 38–126)
ALT: 22 U/L (ref 17–63)
AST: 34 U/L (ref 15–41)
Albumin: 2.8 g/dL — ABNORMAL LOW (ref 3.5–5.0)
Anion gap: 8 (ref 5–15)
BUN: 35 mg/dL — ABNORMAL HIGH (ref 6–20)
CALCIUM: 8.6 mg/dL — AB (ref 8.9–10.3)
CO2: 22 mmol/L (ref 22–32)
CREATININE: 1.81 mg/dL — AB (ref 0.61–1.24)
Chloride: 103 mmol/L (ref 101–111)
GFR calc non Af Amer: 36 mL/min — ABNORMAL LOW (ref >60)
GFR, EST AFRICAN AMERICAN: 42 mL/min — AB (ref >60)
GLUCOSE: 94 mg/dL (ref 65–99)
Potassium: 3.5 mmol/L (ref 3.5–5.1)
SODIUM: 133 mmol/L — AB (ref 135–145)
Total Bilirubin: 0.9 mg/dL (ref 0.3–1.2)
Total Protein: 6 g/dL — ABNORMAL LOW (ref 6.5–8.1)

## 2017-09-30 LAB — PROCALCITONIN: Procalcitonin: 78.07 ng/mL

## 2017-09-30 LAB — BASIC METABOLIC PANEL
Anion gap: 11 (ref 5–15)
BUN: 41 mg/dL — AB (ref 6–20)
CALCIUM: 8.9 mg/dL (ref 8.9–10.3)
CO2: 24 mmol/L (ref 22–32)
CREATININE: 2.4 mg/dL — AB (ref 0.61–1.24)
Chloride: 104 mmol/L (ref 101–111)
GFR calc Af Amer: 30 mL/min — ABNORMAL LOW (ref >60)
GFR, EST NON AFRICAN AMERICAN: 26 mL/min — AB (ref >60)
GLUCOSE: 137 mg/dL — AB (ref 65–99)
Potassium: 4.5 mmol/L (ref 3.5–5.1)
SODIUM: 139 mmol/L (ref 135–145)

## 2017-09-30 LAB — TROPONIN I
Troponin I: 0.06 ng/mL (ref <0.03)
Troponin I: 0.05 ng/mL (ref <0.03)
Troponin I: 0.04 ng/mL (ref <0.03)

## 2017-09-30 LAB — INFLUENZA PANEL BY PCR (TYPE A & B)
Influenza A By PCR: NEGATIVE
Influenza B By PCR: NEGATIVE

## 2017-09-30 LAB — I-STAT CG4 LACTIC ACID, ED: Lactic Acid, Venous: 0.9 mmol/L (ref 0.5–1.9)

## 2017-09-30 MED ORDER — FERROUS SULFATE 325 (65 FE) MG PO TABS
325.0000 mg | ORAL_TABLET | Freq: Two times a day (BID) | ORAL | Status: DC
  Administered 2017-09-30 – 2017-10-02 (×5): 325 mg via ORAL
  Filled 2017-09-30 (×5): qty 1

## 2017-09-30 MED ORDER — DEXTROSE 5 % IV SOLN
1.0000 g | Freq: Three times a day (TID) | INTRAVENOUS | Status: DC
  Administered 2017-09-30 – 2017-10-01 (×3): 1 g via INTRAVENOUS
  Filled 2017-09-30 (×4): qty 1

## 2017-09-30 MED ORDER — FENOFIBRATE 160 MG PO TABS
160.0000 mg | ORAL_TABLET | Freq: Every day | ORAL | Status: DC
Start: 2017-09-30 — End: 2017-10-02
  Administered 2017-09-30 – 2017-10-02 (×3): 160 mg via ORAL
  Filled 2017-09-30 (×3): qty 1

## 2017-09-30 MED ORDER — DIGOXIN 125 MCG PO TABS
0.1250 mg | ORAL_TABLET | Freq: Every day | ORAL | Status: DC
Start: 2017-09-30 — End: 2017-10-02
  Administered 2017-09-30 – 2017-10-02 (×3): 0.125 mg via ORAL
  Filled 2017-09-30 (×3): qty 1

## 2017-09-30 MED ORDER — VANCOMYCIN HCL IN DEXTROSE 1-5 GM/200ML-% IV SOLN: 1000.0000 mg | Freq: Once | INTRAVENOUS | Status: DC

## 2017-09-30 MED ORDER — TRAZODONE HCL 50 MG PO TABS
50.0000 mg | ORAL_TABLET | Freq: Every evening | ORAL | Status: DC | PRN
  Administered 2017-09-30 – 2017-10-01 (×2): 50 mg via ORAL
  Filled 2017-09-30 (×2): qty 1

## 2017-09-30 MED ORDER — ONDANSETRON HCL 4 MG PO TABS: 4.0000 mg | ORAL_TABLET | Freq: Four times a day (QID) | ORAL | Status: DC | PRN

## 2017-09-30 MED ORDER — METOCLOPRAMIDE HCL 5 MG/ML IJ SOLN: 5.0000 mg | Freq: Once | INTRAMUSCULAR | Status: DC

## 2017-09-30 MED ORDER — FOLIC ACID 1 MG PO TABS
1.0000 mg | ORAL_TABLET | Freq: Every day | ORAL | Status: DC
  Administered 2017-09-30 – 2017-10-02 (×3): 1 mg via ORAL
  Filled 2017-09-30 (×3): qty 1

## 2017-09-30 MED ORDER — LEVOFLOXACIN IN D5W 500 MG/100ML IV SOLN: 500.0000 mg | INTRAVENOUS | Status: DC

## 2017-09-30 MED ORDER — VANCOMYCIN HCL 10 G IV SOLR: 1250.0000 mg | INTRAVENOUS | Status: DC

## 2017-09-30 MED ORDER — LEVOFLOXACIN IN D5W 750 MG/150ML IV SOLN
750.0000 mg | INTRAVENOUS | Status: DC
Start: 2017-10-01 — End: 2017-10-01

## 2017-09-30 MED ORDER — AZTREONAM 2 G IJ SOLR
2.0000 g | Freq: Once | INTRAMUSCULAR | Status: AC
  Administered 2017-09-30: 2 g via INTRAVENOUS
  Filled 2017-09-30: qty 2

## 2017-09-30 MED ORDER — VANCOMYCIN HCL 10 G IV SOLR
1500.0000 mg | Freq: Once | INTRAVENOUS | Status: AC
  Administered 2017-09-30: 1500 mg via INTRAVENOUS
  Filled 2017-09-30: qty 1500

## 2017-09-30 MED ORDER — PANTOPRAZOLE SODIUM 40 MG PO TBEC
40.0000 mg | DELAYED_RELEASE_TABLET | Freq: Every day | ORAL | Status: DC
Start: 2017-09-30 — End: 2017-10-02
  Administered 2017-09-30 – 2017-10-02 (×3): 40 mg via ORAL
  Filled 2017-09-30 (×3): qty 1

## 2017-09-30 MED ORDER — ACETAMINOPHEN 325 MG PO TABS
650.0000 mg | ORAL_TABLET | Freq: Four times a day (QID) | ORAL | Status: DC | PRN
  Administered 2017-09-30: 650 mg via ORAL
  Filled 2017-09-30: qty 2

## 2017-09-30 MED ORDER — WARFARIN SODIUM 2.5 MG PO TABS
2.5000 mg | ORAL_TABLET | Freq: Once | ORAL | Status: AC
  Administered 2017-09-30: 2.5 mg via ORAL
  Filled 2017-09-30: qty 1

## 2017-09-30 MED ORDER — ONDANSETRON HCL 4 MG/2ML IJ SOLN: 4.0000 mg | Freq: Four times a day (QID) | INTRAMUSCULAR | Status: DC | PRN

## 2017-09-30 MED ORDER — SERTRALINE HCL 100 MG PO TABS
100.0000 mg | ORAL_TABLET | Freq: Every day | ORAL | Status: DC
  Administered 2017-09-30 – 2017-10-02 (×3): 100 mg via ORAL
  Filled 2017-09-30 (×3): qty 1

## 2017-09-30 MED ORDER — SODIUM CHLORIDE 0.9 % IV SOLN
INTRAVENOUS | Status: DC
  Administered 2017-09-30 – 2017-10-01 (×3): via INTRAVENOUS

## 2017-09-30 MED ORDER — LEVOFLOXACIN IN D5W 750 MG/150ML IV SOLN
750.0000 mg | Freq: Once | INTRAVENOUS | Status: AC
  Administered 2017-09-30: 750 mg via INTRAVENOUS
  Filled 2017-09-30: qty 150

## 2017-09-30 MED ORDER — PRAVASTATIN SODIUM 40 MG PO TABS
40.0000 mg | ORAL_TABLET | Freq: Every day | ORAL | Status: DC
  Administered 2017-09-30 – 2017-10-02 (×3): 40 mg via ORAL
  Filled 2017-09-30 (×3): qty 1

## 2017-09-30 MED ORDER — WARFARIN - PHARMACIST DOSING INPATIENT
Freq: Every day | Status: DC
  Administered 2017-09-30 – 2017-10-01 (×2)

## 2017-09-30 MED ORDER — ACETAMINOPHEN 650 MG RE SUPP: 650.0000 mg | Freq: Four times a day (QID) | RECTAL | Status: DC | PRN

## 2017-09-30 MED ORDER — NITROGLYCERIN 0.4 MG SL SUBL: 0.4000 mg | SUBLINGUAL_TABLET | SUBLINGUAL | Status: DC | PRN

## 2017-09-30 MED ORDER — IPRATROPIUM-ALBUTEROL 0.5-2.5 (3) MG/3ML IN SOLN: 3.0000 mL | RESPIRATORY_TRACT | Status: DC | PRN

## 2017-09-30 MED ORDER — VANCOMYCIN HCL 10 G IV SOLR
1250.0000 mg | INTRAVENOUS | Status: DC
  Administered 2017-10-01: 1250 mg via INTRAVENOUS
  Filled 2017-09-30: qty 1250

## 2017-09-30 NOTE — Progress Notes (Signed)
Pharmacy Antibiotic Note  [name] is a 71 y.o. male admitted on 09/29/2017 with fevers/SOB/sepsis.  Pharmacy has been consulted for Vancomycin, Aztreonam, and Levaquin dosing. Noted SOB reaction to Keflex. Tmax/24h 102, WBC up 25.7, PCT 78, LA trend down to 0.9.  SCr improved to 2.4, CrCl~30-35.  Plan: Increase vancomycin to 1250mg  IV q24h Continue aztreonam 1 g IV q8h Increase Levaquin to 750 mg IV q48h Monitor clinical progress, c/s, renal function F/u de-escalation plan/LOT, vancomycin trough as indicated   Height: 5\' 10"  (177.8 cm) Weight: 205 lb 14.4 oz (93.4 kg) IBW/kg (Calculated) : 73  Temp (24hrs), Avg:99.3 F (37.4 C), Min:97.9 F (36.6 C), Max:102 F (38.9 C)   Recent Labs Lab 09/29/17 2123 09/29/17 2154 09/30/17 0059 09/30/17 0408  WBC 16.6*  --   --  25.7*  CREATININE 2.71*  --   --  2.40*  LATICACIDVEN  --  2.14* 0.90  --     Estimated Creatinine Clearance: 33.4 mL/min (A) (by C-G formula based on SCr of 2.4 mg/dL (H)).    Allergies  Allergen Reactions  . Keflex [Cephalexin] Shortness Of Breath and Other (See Comments)    dizziness    Elicia Lamp, PharmD, BCPS Clinical Pharmacist Rx Phone # for today: [phone] After 3:30PM, please call Main Rx: [phone] 09/30/2017 11:18 AM

## 2017-09-30 NOTE — Progress Notes (Signed)
ANTICOAGULATION CONSULT NOTE - Initial Consult  Pharmacy Consult for Coumadin Indication: atrial fibrillation  Allergies  Allergen Reactions  . Keflex [Cephalexin] Shortness Of Breath and Other (See Comments)    dizziness    Patient Measurements: Height: 5\' 10"  (177.8 cm) Weight: 202 lb (91.6 kg) IBW/kg (Calculated) : 73  Vital Signs: Temp: 102 F (38.9 C) (10/12 2116) BP: 109/47 (10/13 0230) Pulse Rate: 70 (10/13 0230)  Labs:  Recent Labs  09/29/17 2123  HGB 11.9*  HCT 35.8*  PLT 153  LABPROT 28.4*  INR 2.69  CREATININE 2.71*  TROPONINI 0.08*    Estimated Creatinine Clearance: 29.3 mL/min (A) (by C-G formula based on SCr of 2.71 mg/dL (H)).   Medical History: Past Medical History:  Diagnosis Date  . AICD (automatic cardioverter/defibrillator) present   . Anemia   . Anxiety   . CHF (congestive heart failure) ([place])   . Chronic renal insufficiency   . Coronary artery disease   . Depression   . Dyslipidemia   . Dysrhythmia    atrial fibrillation  . ED (erectile dysfunction)   . GERD (gastroesophageal reflux disease)   . Incisional hernia, without obstruction or gangrene 04/10/2017  . Ischemic cardiomyopathy    EF 23%  . MI (myocardial infarction) ([place]) 2000   ANTERIOR, s/p PCI  . Other primary cardiomyopathies   . Peripheral vascular disease ([place])    dvt legs 2017  . Persistent atrial fibrillation (HCC)    on coumadin  . Presence of permanent cardiac pacemaker   . Shortness of breath dyspnea    on exertion    Medications:  No current facility-administered medications on file prior to encounter.    Current Outpatient Prescriptions on File Prior to Encounter  Medication Sig Dispense Refill  . acetaminophen (TYLENOL) 500 MG tablet Take 500 mg by mouth every 8 (eight) hours as needed for mild pain.    Marland Kitchen digoxin (LANOXIN) 0.125 MG tablet Take 0.125 mg by mouth daily.    . fenofibrate 160 MG tablet TAKE 1 TABLET BY MOUTH  DAILY (Patient taking  differently: TAKE 160 mg TABLET BY MOUTH  DAILY) 90 tablet 3  . ferrous sulfate 325 (65 FE) MG tablet Take 325 mg by mouth 2 (two) times daily.    . folic acid (FOLVITE) 696 MCG tablet Take 800 mcg by mouth daily.     . furosemide (LASIX) 40 MG tablet Take 1 tablet (40 mg total) by mouth daily. 90 tablet 3  . losartan (COZAAR) 25 MG tablet Take 1 tablet (25 mg total) by mouth daily. 30 tablet 3  . metoprolol succinate (TOPROL-XL) 25 MG 24 hr tablet Take 1 tablet (25 mg total) by mouth daily at 12 noon. 90 tablet 0  . Multiple Vitamins-Minerals (MULTIVITAMIN ADULTS PO) Take 1 tablet by mouth daily.    . nitroGLYCERIN (NITROSTAT) 0.4 MG SL tablet Place 1 tablet (0.4 mg total) under the tongue every 5 (five) minutes as needed for chest pain. 25 tablet 3  . pantoprazole (PROTONIX) 40 MG tablet Take 40 mg by mouth daily.     . pantoprazole (PROTONIX) 40 MG tablet TAKE 2 TABLETS BY MOUTH  DAILY (Patient taking differently: TAKE 40 mg TABLETS BY MOUTH  DAILY) 180 tablet 3  . potassium chloride SA (K-DUR,KLOR-CON) 20 MEQ tablet Take 20 mEq by mouth daily.    . pravastatin (PRAVACHOL) 40 MG tablet TAKE 1 TABLET BY MOUTH  DAILY (Patient taking differently: TAKE 40 mg TABLET BY MOUTH  DAILY AT BEDTIME)  90 tablet 3  . sertraline (ZOLOFT) 100 MG tablet Take 100 mg by mouth daily.      . vitamin C (ASCORBIC ACID) 250 MG tablet Take 250 mg by mouth 2 (two) times daily.    Marland Kitchen warfarin (COUMADIN) 5 MG tablet Take 0.5 tablets (2.5 mg total) by mouth daily at 6 PM. (Patient taking differently: Take 2.5 mg by mouth at bedtime. Currently 2.5 mg tablet 7 days weekly) 30 tablet 0  . metoprolol succinate (TOPROL-XL) 25 MG 24 hr tablet TAKE 1 TABLET BY MOUTH  DAILY AT 12 NOON. (Patient not taking: Reported on 09/29/2017) 90 tablet 3     Assessment: 71 y.o. male admitted with sepsis, h/o Afib, to continue Coumadin Goal of Therapy:  INR 2-3 Monitor platelets by anticoagulation protocol: Yes   Plan:  F/U daily  INR  [name], [name] 09/30/2017,2:48 AM

## 2017-09-30 NOTE — ED Notes (Signed)
2nd attempt to call report  Nurse wants 5 more minutes to finishe her dinner

## 2017-09-30 NOTE — Progress Notes (Signed)
Patient ID: KEVAL NAM, male   DOB: [birth year], 71 y.o.   MRN: [id number] Patient was admitted early this morning for sepsis/SIRS of unknown origin and was started on broad-spectrum antibiotics. I have reviewed the medical records and this morning's H&P myself. I have seen and examined the patient at bedside and explained the plan of care. Continue IV fluids and antibiotics. Renal function is improving. Follow cultures. 2-D echo pending. Will cycle troponins. Currently patient is chest pain-free. For now patient will not need cardiology evaluation but if troponin rises and if the patient starts having chest pain, will call cardiology.

## 2017-09-30 NOTE — ED Notes (Signed)
Attempted to give  Report  Nurse off the floor

## 2017-09-30 NOTE — ED Notes (Signed)
Rep;olrt given to rn on 3e

## 2017-09-30 NOTE — Progress Notes (Signed)
[facility] for Coumadin Indication: atrial fibrillation  Allergies  Allergen Reactions  . Keflex [Cephalexin] Shortness Of Breath and Other (See Comments)    dizziness    Patient Measurements: Height: 5\' 10"  (177.8 cm) Weight: 205 lb 14.4 oz (93.4 kg) IBW/kg (Calculated) : 73  Vital Signs: Temp: 98 F (36.7 C) (10/13 0900) Temp Source: Oral (10/13 0900) BP: 120/51 (10/13 0900) Pulse Rate: 70 (10/13 0900)  Labs:  Recent Labs  09/29/17 2123 09/30/17 0408  HGB 11.9* 11.1*  HCT 35.8* 34.8*  PLT 153 136*  LABPROT 28.4* 27.6*  INR 2.69 2.60  CREATININE 2.71* 2.40*  TROPONINI 0.08*  --     Estimated Creatinine Clearance: 33.4 mL/min (A) (by C-G formula based on SCr of 2.4 mg/dL (H)).   Medical History: Past Medical History:  Diagnosis Date  . AICD (automatic cardioverter/defibrillator) present   . Anemia   . Anxiety   . CHF (congestive heart failure) ([place])   . Chronic renal insufficiency   . Coronary artery disease   . Depression   . Dyslipidemia   . Dysrhythmia    atrial fibrillation  . ED (erectile dysfunction)   . GERD (gastroesophageal reflux disease)   . Incisional hernia, without obstruction or gangrene 04/10/2017  . Ischemic cardiomyopathy    EF 23%  . MI (myocardial infarction) ([place]) 2000   ANTERIOR, s/p PCI  . Other primary cardiomyopathies   . Peripheral vascular disease ([place])    dvt legs 2017  . Persistent atrial fibrillation (HCC)    on coumadin  . Presence of permanent cardiac pacemaker   . Shortness of breath dyspnea    on exertion    Medications:  No current facility-administered medications on file prior to encounter.    Current Outpatient Prescriptions on File Prior to Encounter  Medication Sig Dispense Refill  . acetaminophen (TYLENOL) 500 MG tablet Take 500 mg by mouth every 8 (eight) hours as needed for mild pain.    Marland Kitchen digoxin (LANOXIN) 0.125 MG tablet Take 0.125 mg by mouth daily.    .  fenofibrate 160 MG tablet TAKE 1 TABLET BY MOUTH  DAILY (Patient taking differently: TAKE 160 mg TABLET BY MOUTH  DAILY) 90 tablet 3  . ferrous sulfate 325 (65 FE) MG tablet Take 325 mg by mouth 2 (two) times daily.    . folic acid (FOLVITE) 093 MCG tablet Take 800 mcg by mouth daily.     . furosemide (LASIX) 40 MG tablet Take 1 tablet (40 mg total) by mouth daily. 90 tablet 3  . losartan (COZAAR) 25 MG tablet Take 1 tablet (25 mg total) by mouth daily. 30 tablet 3  . metoprolol succinate (TOPROL-XL) 25 MG 24 hr tablet Take 1 tablet (25 mg total) by mouth daily at 12 noon. 90 tablet 0  . Multiple Vitamins-Minerals (MULTIVITAMIN ADULTS PO) Take 1 tablet by mouth daily.    . nitroGLYCERIN (NITROSTAT) 0.4 MG SL tablet Place 1 tablet (0.4 mg total) under the tongue every 5 (five) minutes as needed for chest pain. 25 tablet 3  . pantoprazole (PROTONIX) 40 MG tablet Take 40 mg by mouth daily.     . pantoprazole (PROTONIX) 40 MG tablet TAKE 2 TABLETS BY MOUTH  DAILY (Patient taking differently: TAKE 40 mg TABLETS BY MOUTH  DAILY) 180 tablet 3  . potassium chloride SA (K-DUR,KLOR-CON) 20 MEQ tablet Take 20 mEq by mouth daily.    . pravastatin (PRAVACHOL) 40 MG tablet TAKE 1 TABLET BY MOUTH  DAILY (Patient taking differently: TAKE 40 mg TABLET BY MOUTH  DAILY AT BEDTIME) 90 tablet 3  . sertraline (ZOLOFT) 100 MG tablet Take 100 mg by mouth daily.      . vitamin C (ASCORBIC ACID) 250 MG tablet Take 250 mg by mouth 2 (two) times daily.    Marland Kitchen warfarin (COUMADIN) 5 MG tablet Take 0.5 tablets (2.5 mg total) by mouth daily at 6 PM. (Patient taking differently: Take 2.5 mg by mouth at bedtime. Currently 2.5 mg tablet 7 days weekly) 30 tablet 0  . metoprolol succinate (TOPROL-XL) 25 MG 24 hr tablet TAKE 1 TABLET BY MOUTH  DAILY AT 12 NOON. (Patient not taking: Reported on 09/29/2017) 90 tablet 3     Assessment: 71 y.o. male with h/o Afib, to continue Coumadin per Pharmacy from pta. INR remains therapeutic at 2.6.  Appears patient missed dose 10/12. Hg low stable, plt down to 136. No bleed documented. Noted DDI with digoxin (on pta) and Levaquin - may cause INR to bump.  PTA warfarin dose: 2.5mg  daily per last anticoag clinic note (last dose 10/11 pta)  Goal of Therapy:  INR 2-3 Monitor platelets by anticoagulation protocol: Yes   Plan:  Warfarin 2.5mg  PO x 1 Daily INR Monitor CBC, s/sx bleeding, DDI   Elicia Lamp, PharmD, BCPS Clinical Pharmacist Rx Phone # for today: [phone] After 3:30PM, please call Main Rx: #[id number] 09/30/2017 11:06 AM
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
## 2017-09-30 NOTE — Progress Notes (Signed)
Pt stable, vitals stable, no any complain of chest pain and SOB, renal USG done, IV fluid and IVABX continue, will continue to monitor the patient

## 2017-09-30 NOTE — Progress Notes (Signed)
Pharmacy Antibiotic Note  [name] is a 71 y.o. male admitted on 09/29/2017 with fevers/SOB/sepsis.  Pharmacy has been consulted for Vancomycin Aztreonam and Levaquin dosing.  Plan: Vancomycin 1500 mg IV now, then 1250 mg IV q48h Aztreonam 1 g IV q8h Levaquin 500 mg IV q48h  Height: 5\' 10"  (177.8 cm) Weight: 202 lb (91.6 kg) IBW/kg (Calculated) : 73  Temp (24hrs), Avg:102 F (38.9 C), Min:102 F (38.9 C), Max:102 F (38.9 C)   Recent Labs Lab 09/29/17 2123 09/29/17 2154 09/30/17 0059  WBC 16.6*  --   --   CREATININE 2.71*  --   --   LATICACIDVEN  --  2.14* 0.90    Estimated Creatinine Clearance: 29.3 mL/min (A) (by C-G formula based on SCr of 2.71 mg/dL (H)).    Allergies  Allergen Reactions  . Keflex [Cephalexin] Shortness Of Breath and Other (See Comments)    dizziness    Spenser Harren, Bronson Curb 09/30/2017 2:06 AM

## 2017-10-01 ENCOUNTER — Inpatient Hospital Stay (HOSPITAL_COMMUNITY): Payer: Medicare Other

## 2017-10-01 DIAGNOSIS — I5022 Chronic systolic (congestive) heart failure: Secondary | ICD-10-CM

## 2017-10-01 DIAGNOSIS — I340 Nonrheumatic mitral (valve) insufficiency: Secondary | ICD-10-CM

## 2017-10-01 DIAGNOSIS — N179 Acute kidney failure, unspecified: Principal | ICD-10-CM

## 2017-10-01 DIAGNOSIS — I361 Nonrheumatic tricuspid (valve) insufficiency: Secondary | ICD-10-CM

## 2017-10-01 DIAGNOSIS — I482 Chronic atrial fibrillation: Secondary | ICD-10-CM

## 2017-10-01 DIAGNOSIS — Z7901 Long term (current) use of anticoagulants: Secondary | ICD-10-CM

## 2017-10-01 DIAGNOSIS — N183 Chronic kidney disease, stage 3 (moderate): Secondary | ICD-10-CM

## 2017-10-01 DIAGNOSIS — R6510 Systemic inflammatory response syndrome (SIRS) of non-infectious origin without acute organ dysfunction: Secondary | ICD-10-CM

## 2017-10-01 DIAGNOSIS — R509 Fever, unspecified: Secondary | ICD-10-CM

## 2017-10-01 DIAGNOSIS — I10 Essential (primary) hypertension: Secondary | ICD-10-CM

## 2017-10-01 LAB — CBC WITH DIFFERENTIAL/PLATELET
BASOS ABS: 0 10*3/uL (ref 0.0–0.1)
Basophils Relative: 0 %
EOS ABS: 0 10*3/uL (ref 0.0–0.7)
Eosinophils Relative: 0 %
HEMATOCRIT: 34 % — AB (ref 39.0–52.0)
HEMOGLOBIN: 10.6 g/dL — AB (ref 13.0–17.0)
Lymphocytes Relative: 11 %
Lymphs Abs: 1.5 10*3/uL (ref 0.7–4.0)
MCH: 26.2 pg (ref 26.0–34.0)
MCHC: 31.2 g/dL (ref 30.0–36.0)
MCV: 84.2 fL (ref 78.0–100.0)
Monocytes Absolute: 1.1 10*3/uL — ABNORMAL HIGH (ref 0.1–1.0)
Monocytes Relative: 9 %
NEUTROS ABS: 10.7 10*3/uL — AB (ref 1.7–7.7)
NEUTROS PCT: 80 %
Platelets: 140 10*3/uL — ABNORMAL LOW (ref 150–400)
RBC: 4.04 MIL/uL — ABNORMAL LOW (ref 4.22–5.81)
RDW: 15.3 % (ref 11.5–15.5)
WBC: 13.4 10*3/uL — ABNORMAL HIGH (ref 4.0–10.5)

## 2017-10-01 LAB — PROTIME-INR
INR: 2.73
PROTHROMBIN TIME: 28.7 s — AB (ref 11.4–15.2)

## 2017-10-01 LAB — BASIC METABOLIC PANEL
Anion gap: 7 (ref 5–15)
BUN: 22 mg/dL — AB (ref 6–20)
CHLORIDE: 109 mmol/L (ref 101–111)
CO2: 23 mmol/L (ref 22–32)
CREATININE: 1.28 mg/dL — AB (ref 0.61–1.24)
Calcium: 9 mg/dL (ref 8.9–10.3)
GFR, EST NON AFRICAN AMERICAN: 55 mL/min — AB (ref >60)
Glucose, Bld: 89 mg/dL (ref 65–99)
POTASSIUM: 3.9 mmol/L (ref 3.5–5.1)
SODIUM: 139 mmol/L (ref 135–145)

## 2017-10-01 LAB — MAGNESIUM: MAGNESIUM: 1.9 mg/dL (ref 1.7–2.4)

## 2017-10-01 LAB — ECHOCARDIOGRAM COMPLETE
Height: 70 in
WEIGHTICAEL: 3324.8 [oz_av]

## 2017-10-01 MED ORDER — WARFARIN SODIUM 2.5 MG PO TABS
2.5000 mg | ORAL_TABLET | Freq: Once | ORAL | Status: AC
  Administered 2017-10-01: 2.5 mg via ORAL
  Filled 2017-10-01: qty 1

## 2017-10-01 MED ORDER — LEVOFLOXACIN IN D5W 750 MG/150ML IV SOLN
750.0000 mg | INTRAVENOUS | Status: DC
  Administered 2017-10-01: 750 mg via INTRAVENOUS
  Filled 2017-10-01: qty 150

## 2017-10-01 NOTE — Progress Notes (Signed)
Pharmacy Antibiotic Note  [name] is a 71 y.o. male admitted on 09/29/2017 with fevers/SOB/sepsis.  Pharmacy has been consulted for Levaquin dosing. Vanc/aztreonam d/c'd 10/14. Noted SOB reaction to Keflex.  SCr improved to 1.28, CrCl~60-65.  Plan: Increase Levaquin to 750 mg IV q24h - 1 more day per MD note Monitor clinical progress, c/s, renal function, LOT   Height: 5\' 10"  (177.8 cm) Weight: 207 lb 12.8 oz (94.3 kg) IBW/kg (Calculated) : 73  Temp (24hrs), Avg:99.5 F (37.5 C), Min:97.5 F (36.4 C), Max:103 F (39.4 C)   Recent Labs Lab 09/29/17 2123 09/29/17 2154 09/30/17 0059 09/30/17 0408 09/30/17 1436 10/01/17 0357 10/01/17 1136  WBC 16.6*  --   --  25.7*  --  13.4*  --   CREATININE 2.71*  --   --  2.40* 1.81*  --  1.28*  LATICACIDVEN  --  2.14* 0.90  --   --   --   --     Estimated Creatinine Clearance: 62.8 mL/min (A) (by C-G formula based on SCr of 1.28 mg/dL (H)).    Allergies  Allergen Reactions  . Keflex [Cephalexin] Shortness Of Breath and Other (See Comments)    dizziness    Elicia Lamp, PharmD, BCPS Clinical Pharmacist Rx Phone # for today: [phone] After 3:30PM, please call Main Rx: [phone] 10/01/2017 12:41 PM

## 2017-10-01 NOTE — Progress Notes (Signed)
Patient ID: [name], male   DOB: January 25, 1946, 71 y.o.   MRN: [id number]  PROGRESS NOTE    [name]  SNK:[id number] DOB: January 24, 1946 DOA: 09/29/2017 PCP: Christain Sacramento, MD   Brief Narrative:  70 year old male with history of CAD, atrial fibrillation on chronic anticoagulation, systolic CHF with EF of 93-79% in 01/06/2016 status post AICD, DVT, PVD presented with weakness. He was admitted with fever along with acute kidney injury and was started on IV fluids and IV antibiotics.    Assessment & Plan:   Principal Problem:   Sepsis ([place]) Active Problems:   Essential hypertension   Chronic systolic CHF (congestive heart failure) (HCC)   Biventricular implantable cardioverter-defibrillator Medtronic   Chronic atrial fibrillation (HCC)   Chronic anticoagulation-Couamdin   Acute renal failure superimposed on chronic kidney disease (HCC)   SIRS - no obvious source. Doubt that the patient had sepsis.  - hemodynamically improving. Currently on vancomycin, Levaquin and aztreonam. Cultures negative so far. Had MAXIMUM TEMPERATURE of 100.8 overnight.  - leukocytosis improving. Patient feels better. Discontinue vancomycin and aztreonam. Continue Levaquin for one more day. - Discontinue IV fluids  Leukocytosis - Improving  Minimally elevated troponin - Subsequent troponins did not elevate. Patient did not have any chest pain. - Echo showed ejection fraction of 25-30% - outpatient follow-up with cardiology; patient is agreeable to that  Acute renal failure superimposed on chronic kidney disease stage III: Baseline creatinine previously 1.3-1.4, but patient presents with BUN 44 and creatinine 2.71.  - creatinine is 1.81 today. Stop IV fluids; repeat a.m. Labs -  renal ultrasound was negative for hydronephrosis - hold Lasix for today as well  Atrial fibrillation with chronic anticoagulation :  - continue Coumadin. INR therapeutic  Chronic Systolic CHF, s/p AICD:  -  Compensated. We will hold Lasix today as well. Outpatient follow-up with cardiology  Essential hypertension - monitor blood pressure.  Hyperlipidemia - Continue pravastatin  GERD - Continue Protonix   DVT prophylaxis: Coumadin Code Status:  full Family Communication: spoke to patient and patient's wife at bedside Disposition Plan:home in 1-2 days if condition improves  Consultants: none  Procedures:  Echo on 09/30/2017 Study Conclusions  - Left ventricle: The cavity size was normal. Systolic function was   severely reduced. The estimated ejection fraction was in the   range of 25% to 30%. There is akinesis of the apical anterior,   lateral, inferolateral, inferior, and apical myocardium with no   evidence of apical thrombus by definity contrast. The study is   not technically sufficient to allow evaluation of LV diastolic   function. Doppler parameters are consistent with high ventricular   filling pressure. - Mitral valve: There was mild regurgitation. - Left atrium: The atrium was mildly dilated. - Tricuspid valve: There was mild-moderate regurgitation. - Pulmonic valve: There was trivial regurgitation. - Pulmonary arteries: PA peak pressure: 37 mm Hg (S).  Impressions:  - The right ventricular systolic pressure was increased consistent   with mild pulmonary hypertension.  Antimicrobials:  Aztreonam, Levaquin and vancomycin from 09/29/2017   Subjective: Patient seen and examined at bedside. He feels much better. No overnight chest pain, shortness of breath, nausea or vomiting. He felt feverish overnight  Objective: Vitals:   09/30/17 2203 09/30/17 2332 10/01/17 0614 10/01/17 0740  BP:  (!) 110/43 132/63 130/60  Pulse:  70 69   Resp:  18 18   Temp: (!) 100.8 F (38.2 C) (!) 97.5 F (36.4 C) 98.3 F (36.8 C)  TempSrc:  Oral Oral   SpO2:  95% 97%   Weight:   94.3 kg (207 lb 12.8 oz)   Height:        Intake/Output Summary (Last 24 hours) at  10/01/17 1149 Last data filed at 10/01/17 0540  Gross per 24 hour  Intake          2551.25 ml  Output             1850 ml  Net           701.25 ml   Filed Weights   09/29/17 2118 09/30/17 0500 10/01/17 8676  Weight: 91.6 kg (202 lb) 93.4 kg (205 lb 14.4 oz) 94.3 kg (207 lb 12.8 oz)    Examination:  General exam: Appears calm and comfortable  Respiratory system: Bilateral decreased breath sound at bases  Cardiovascular system: S1 & S2 heard, rate controlled  Gastrointestinal system: Abdomen is nondistended, soft and nontender. Normal bowel sounds heard. Extremities: No cyanosis, clubbing; Trace edema     Data Reviewed: I have personally reviewed following labs and imaging studies  CBC:  Recent Labs Lab 09/29/17 2123 09/30/17 0408 10/01/17 0357  WBC 16.6* 25.7* 13.4*  NEUTROABS  --   --  10.7*  HGB 11.9* 11.1* 10.6*  HCT 35.8* 34.8* 34.0*  MCV 81.7 83.1 84.2  PLT 153 136* 195*   Basic Metabolic Panel:  Recent Labs Lab 09/29/17 2123 09/30/17 0408 09/30/17 1436 10/01/17 0357  NA 136 139 133*  --   K 3.7 4.5 3.5  --   CL 102 104 103  --   CO2 21* 24 22  --   GLUCOSE 101* 137* 94  --   BUN 44* 41* 35*  --   CREATININE 2.71* 2.40* 1.81*  --   CALCIUM 8.8* 8.9 8.6*  --   MG  --   --   --  1.9   GFR: Estimated Creatinine Clearance: 44.4 mL/min (A) (by C-G formula based on SCr of 1.81 mg/dL (H)). Liver Function Tests:  Recent Labs Lab 09/29/17 2123 09/30/17 1436  AST 42* 34  ALT 20 22  ALKPHOS 49 36*  BILITOT 1.5* 0.9  PROT 6.0* 6.0*  ALBUMIN 3.3* 2.8*    Recent Labs Lab 09/29/17 2123  LIPASE 23   No results for input(s): AMMONIA in the last 168 hours. Coagulation Profile:  Recent Labs Lab 09/29/17 2123 09/30/17 0408 10/01/17 0357  INR 2.69 2.60 2.73   Cardiac Enzymes:  Recent Labs Lab 09/29/17 2123 09/30/17 0905 09/30/17 1436 09/30/17 1936  TROPONINI 0.08* 0.06* 0.05* 0.04*   BNP (last 3 results) No results for input(s): PROBNP  in the last 8760 hours. HbA1C: No results for input(s): HGBA1C in the last 72 hours. CBG: No results for input(s): GLUCAP in the last 168 hours. Lipid Profile: No results for input(s): CHOL, HDL, LDLCALC, TRIG, CHOLHDL, LDLDIRECT in the last 72 hours. Thyroid Function Tests: No results for input(s): TSH, T4TOTAL, FREET4, T3FREE, THYROIDAB in the last 72 hours. Anemia Panel: No results for input(s): VITAMINB12, FOLATE, FERRITIN, TIBC, IRON, RETICCTPCT in the last 72 hours. Sepsis Labs:  Recent Labs Lab 09/29/17 2154 09/30/17 0059 09/30/17 0408  PROCALCITON  --   --  78.07  LATICACIDVEN 2.14* 0.90  --     Recent Results (from the past 240 hour(s))  Respiratory Panel by PCR     Status: None   Collection Time: 09/29/17 11:10 PM  Result Value Ref Range Status   Adenovirus NOT DETECTED NOT  DETECTED Final   Coronavirus 229E NOT DETECTED NOT DETECTED Final   Coronavirus HKU1 NOT DETECTED NOT DETECTED Final   Coronavirus NL63 NOT DETECTED NOT DETECTED Final   Coronavirus OC43 NOT DETECTED NOT DETECTED Final   Metapneumovirus NOT DETECTED NOT DETECTED Final   Rhinovirus / Enterovirus NOT DETECTED NOT DETECTED Final   Influenza A NOT DETECTED NOT DETECTED Final   Influenza B NOT DETECTED NOT DETECTED Final   Parainfluenza Virus 1 NOT DETECTED NOT DETECTED Final   Parainfluenza Virus 2 NOT DETECTED NOT DETECTED Final   Parainfluenza Virus 3 NOT DETECTED NOT DETECTED Final   Parainfluenza Virus 4 NOT DETECTED NOT DETECTED Final   Respiratory Syncytial Virus NOT DETECTED NOT DETECTED Final   Bordetella pertussis NOT DETECTED NOT DETECTED Final   Chlamydophila pneumoniae NOT DETECTED NOT DETECTED Final   Mycoplasma pneumoniae NOT DETECTED NOT DETECTED Final         Radiology Studies: Dg Chest 2 View  Result Date: 09/29/2017 CLINICAL DATA:  Cough, congestion, shortness of breath. EXAM: CHEST  2 VIEW COMPARISON:  02/24/2016 FINDINGS: Stable in position cardiac pacemaker. Enlarged  cardiac silhouette. Mediastinal contours appear intact. Calcific atherosclerotic disease and tortuosity of the aorta. There is no evidence of focal airspace consolidation, pleural effusion or pneumothorax. Osseous structures are without acute abnormality. Soft tissues are grossly normal. IMPRESSION: Enlarged cardiac silhouette. Calcific atherosclerotic disease and tortuosity of the aorta. Electronically Signed   By: Fidela Salisbury M.D.   On: 09/29/2017 22:14   US Renal  Result Date: 09/30/2017 CLINICAL DATA:  Renal failure. EXAM: RENAL / URINARY TRACT ULTRASOUND COMPLETE COMPARISON:  CT abdomen and pelvis dated November 07, 2016. FINDINGS: Right Kidney: Length: 12.5 cm. Mildly increased echogenicity. No mass or hydronephrosis visualized. Several simple cysts are seen, the largest measuring 4.3 x 2.7 x 3.4 cm. Left Kidney: Length: 11.7 cm. Mildly increased echogenicity. No mass or hydronephrosis visualized. Several simple cysts are seen, the largest measuring 4.0 x 3.1 x 3.1 cm. Bladder: Appears normal for degree of bladder distention. IMPRESSION: Mildly increased renal cortical echogenicity, consistent with medical renal disease. No hydronephrosis or solid mass. Electronically Signed   By: Titus Dubin M.D.   On: 09/30/2017 09:39        Scheduled Meds: . digoxin  0.125 mg Oral Daily  . fenofibrate  160 mg Oral Daily  . ferrous sulfate  325 mg Oral BID WC  . folic acid  1 mg Oral Daily  . metoCLOPramide (REGLAN) injection  5 mg Intravenous Once  . pantoprazole  40 mg Oral Daily  . pravastatin  40 mg Oral Daily  . sertraline  100 mg Oral Daily  . warfarin  2.5 mg Oral ONCE-1800  . Warfarin - Pharmacist Dosing Inpatient   Does not apply q1800   Continuous Infusions: . aztreonam Stopped (10/01/17 0606)  . levofloxacin (LEVAQUIN) IV    . vancomycin Stopped (10/01/17 0754)     LOS: 1 day        Aline August, MD [facility] Pager [phone]  If 7PM-7AM, please  contact night-coverage [email or web address] Password [email or web address] 10/01/2017, 11:49 AM

## 2017-10-01 NOTE — Progress Notes (Signed)
[place] for Coumadin Indication: atrial fibrillation  Allergies  Allergen Reactions  . Keflex [Cephalexin] Shortness Of Breath and Other (See Comments)    dizziness    Patient Measurements: Height: 5\' 10"  (177.8 cm) Weight: 207 lb 12.8 oz (94.3 kg) IBW/kg (Calculated) : 73  Vital Signs: Temp: 98.3 F (36.8 C) (10/14 0614) Temp Source: Oral (10/14 0614) BP: 132/63 (10/14 0614) Pulse Rate: 69 (10/14 0614)  Labs:  Recent Labs  09/29/17 2123 09/30/17 0408 09/30/17 0905 09/30/17 1436 09/30/17 1936 10/01/17 0357  HGB 11.9* 11.1*  --   --   --  10.6*  HCT 35.8* 34.8*  --   --   --  34.0*  PLT 153 136*  --   --   --  140*  LABPROT 28.4* 27.6*  --   --   --  28.7*  INR 2.69 2.60  --   --   --  2.73  CREATININE 2.71* 2.40*  --  1.81*  --   --   TROPONINI 0.08*  --  0.06* 0.05* 0.04*  --     Estimated Creatinine Clearance: 44.4 mL/min (A) (by C-G formula based on SCr of 1.81 mg/dL (H)).   Medical History: Past Medical History:  Diagnosis Date  . AICD (automatic cardioverter/defibrillator) present   . Anemia   . Anxiety   . CHF (congestive heart failure) ([place])   . Chronic renal insufficiency   . Coronary artery disease   . Depression   . Dyslipidemia   . Dysrhythmia    atrial fibrillation  . ED (erectile dysfunction)   . GERD (gastroesophageal reflux disease)   . Incisional hernia, without obstruction or gangrene 04/10/2017  . Ischemic cardiomyopathy    EF 23%  . MI (myocardial infarction) ([place]) 2000   ANTERIOR, s/p PCI  . Other primary cardiomyopathies   . Peripheral vascular disease ([place])    dvt legs 2017  . Persistent atrial fibrillation (HCC)    on coumadin  . Presence of permanent cardiac pacemaker   . Shortness of breath dyspnea    on exertion    Medications:  No current facility-administered medications on file prior to encounter.    Current Outpatient Prescriptions on File Prior to Encounter  Medication  Sig Dispense Refill  . acetaminophen (TYLENOL) 500 MG tablet Take 500 mg by mouth every 8 (eight) hours as needed for mild pain.    Marland Kitchen digoxin (LANOXIN) 0.125 MG tablet Take 0.125 mg by mouth daily.    . fenofibrate 160 MG tablet TAKE 1 TABLET BY MOUTH  DAILY (Patient taking differently: TAKE 160 mg TABLET BY MOUTH  DAILY) 90 tablet 3  . ferrous sulfate 325 (65 FE) MG tablet Take 325 mg by mouth 2 (two) times daily.    . folic acid (FOLVITE) 902 MCG tablet Take 800 mcg by mouth daily.     . furosemide (LASIX) 40 MG tablet Take 1 tablet (40 mg total) by mouth daily. 90 tablet 3  . losartan (COZAAR) 25 MG tablet Take 1 tablet (25 mg total) by mouth daily. 30 tablet 3  . metoprolol succinate (TOPROL-XL) 25 MG 24 hr tablet Take 1 tablet (25 mg total) by mouth daily at 12 noon. 90 tablet 0  . Multiple Vitamins-Minerals (MULTIVITAMIN ADULTS PO) Take 1 tablet by mouth daily.    . nitroGLYCERIN (NITROSTAT) 0.4 MG SL tablet Place 1 tablet (0.4 mg total) under the tongue every 5 (five) minutes as needed for chest pain. 25 tablet  3  . pantoprazole (PROTONIX) 40 MG tablet Take 40 mg by mouth daily.     . pantoprazole (PROTONIX) 40 MG tablet TAKE 2 TABLETS BY MOUTH  DAILY (Patient taking differently: TAKE 40 mg TABLETS BY MOUTH  DAILY) 180 tablet 3  . potassium chloride SA (K-DUR,KLOR-CON) 20 MEQ tablet Take 20 mEq by mouth daily.    . pravastatin (PRAVACHOL) 40 MG tablet TAKE 1 TABLET BY MOUTH  DAILY (Patient taking differently: TAKE 40 mg TABLET BY MOUTH  DAILY AT BEDTIME) 90 tablet 3  . sertraline (ZOLOFT) 100 MG tablet Take 100 mg by mouth daily.      . vitamin C (ASCORBIC ACID) 250 MG tablet Take 250 mg by mouth 2 (two) times daily.    Marland Kitchen warfarin (COUMADIN) 5 MG tablet Take 0.5 tablets (2.5 mg total) by mouth daily at 6 PM. (Patient taking differently: Take 2.5 mg by mouth at bedtime. Currently 2.5 mg tablet 7 days weekly) 30 tablet 0  . metoprolol succinate (TOPROL-XL) 25 MG 24 hr tablet TAKE 1 TABLET BY  MOUTH  DAILY AT 12 NOON. (Patient not taking: Reported on 09/29/2017) 90 tablet 3     Assessment: 71 y.o. male with h/o Afib, to continue Coumadin per Pharmacy from pta. INR remains therapeutic at 2.73. Appears patient missed dose 10/12. Hg low stable, plt down to 136. No bleed documented. Noted DDI with digoxin (but on this on pta) and Levaquin - may cause INR to bump.  PTA warfarin dose: 2.5mg  daily per last anticoag clinic note (last dose 10/11 pta)  Goal of Therapy:  INR 2-3 Monitor platelets by anticoagulation protocol: Yes   Plan:  Warfarin 2.5mg  PO x 1 Daily INR - watch closely on Levaquin, may need to reduce dose Monitor CBC, s/sx bleeding, DDI   Elicia Lamp, PharmD, BCPS Clinical Pharmacist Rx Phone # for today: [phone] After 3:30PM, please call Main Rx: #[id number] 10/01/2017 11:19 AM

## 2017-10-01 NOTE — Progress Notes (Signed)
  Echocardiogram 2D Echocardiogram has been performed with [facility].  [name] 10/01/2017, 10:50 AM

## 2017-10-01 NOTE — Progress Notes (Signed)
Echo been done, pt is stable, IV fluid DC, tolerating diet and meds, no any complain of CP and SOB, will continue to monitor  Palma Holter, RN

## 2017-10-01 NOTE — Progress Notes (Signed)
Pt spiked a fever last night. Tylenol was given and was effective. Temperature is within normal limits. Pt is stable with no complaints.   Drue Flirt, RN

## 2017-10-02 LAB — PROTIME-INR
INR: 2.88
PROTHROMBIN TIME: 30 s — AB (ref 11.4–15.2)

## 2017-10-02 LAB — CBC WITH DIFFERENTIAL/PLATELET
BASOS ABS: 0 10*3/uL (ref 0.0–0.1)
Basophils Relative: 0 %
Eosinophils Absolute: 0.1 10*3/uL (ref 0.0–0.7)
Eosinophils Relative: 1 %
HEMATOCRIT: 32.2 % — AB (ref 39.0–52.0)
HEMOGLOBIN: 10.2 g/dL — AB (ref 13.0–17.0)
LYMPHS PCT: 20 %
Lymphs Abs: 1.7 10*3/uL (ref 0.7–4.0)
MCH: 26.4 pg (ref 26.0–34.0)
MCHC: 31.7 g/dL (ref 30.0–36.0)
MCV: 83.4 fL (ref 78.0–100.0)
MONO ABS: 0.8 10*3/uL (ref 0.1–1.0)
Monocytes Relative: 10 %
Neutro Abs: 5.8 10*3/uL (ref 1.7–7.7)
Neutrophils Relative %: 69 %
Platelets: 149 10*3/uL — ABNORMAL LOW (ref 150–400)
RBC: 3.86 MIL/uL — AB (ref 4.22–5.81)
RDW: 15.2 % (ref 11.5–15.5)
WBC: 8.5 10*3/uL (ref 4.0–10.5)

## 2017-10-02 LAB — BASIC METABOLIC PANEL
Anion gap: 8 (ref 5–15)
BUN: 18 mg/dL (ref 6–20)
CO2: 25 mmol/L (ref 22–32)
CREATININE: 1.42 mg/dL — AB (ref 0.61–1.24)
Calcium: 9.1 mg/dL (ref 8.9–10.3)
Chloride: 106 mmol/L (ref 101–111)
GFR calc Af Amer: 57 mL/min — ABNORMAL LOW (ref >60)
GFR, EST NON AFRICAN AMERICAN: 49 mL/min — AB (ref >60)
GLUCOSE: 90 mg/dL (ref 65–99)
POTASSIUM: 3.7 mmol/L (ref 3.5–5.1)
Sodium: 139 mmol/L (ref 135–145)

## 2017-10-02 LAB — MAGNESIUM: Magnesium: 1.7 mg/dL (ref 1.7–2.4)

## 2017-10-02 MED ORDER — PRAVASTATIN SODIUM 40 MG PO TABS: ORAL_TABLET | ORAL | Status: DC

## 2017-10-02 MED ORDER — WARFARIN SODIUM 2.5 MG PO TABS: 2.5000 mg | ORAL_TABLET | Freq: Every day | ORAL | Status: DC

## 2017-10-02 MED ORDER — LEVOFLOXACIN 500 MG PO TABS: 500.0000 mg | ORAL_TABLET | Freq: Every day | ORAL | 0 refills | Status: AC

## 2017-10-02 MED ORDER — FUROSEMIDE 40 MG PO TABS: 20.0000 mg | ORAL_TABLET | Freq: Every day | ORAL | Status: DC

## 2017-10-02 NOTE — Progress Notes (Signed)
Patient complained again that he feels like he is having a fever and chils. Temp re-checked, 98.8. Patient given warm blanket   Will continue to monitor.  Jeyden Coffelt, RN

## 2017-10-02 NOTE — Progress Notes (Signed)
[place] for Coumadin Indication: atrial fibrillation  Allergies  Allergen Reactions  . Keflex [Cephalexin] Shortness Of Breath and Other (See Comments)    dizziness    Patient Measurements: Height: 5\' 10"  (177.8 cm) Weight: 207 lb 14.4 oz (94.3 kg) IBW/kg (Calculated) : 73  Vital Signs: Temp: 98.8 F (37.1 C) (10/15 0623) Temp Source: Oral (10/15 0623) BP: 131/69 (10/15 0455) Pulse Rate: 70 (10/15 0455)  Labs:  Recent Labs  09/30/17 0408 09/30/17 0905 09/30/17 1436 09/30/17 1936 10/01/17 0357 10/01/17 1136 10/02/17 0438  HGB 11.1*  --   --   --  10.6*  --  10.2*  HCT 34.8*  --   --   --  34.0*  --  32.2*  PLT 136*  --   --   --  140*  --  149*  LABPROT 27.6*  --   --   --  28.7*  --  30.0*  INR 2.60  --   --   --  2.73  --  2.88  CREATININE 2.40*  --  1.81*  --   --  1.28* 1.42*  TROPONINI  --  0.06* 0.05* 0.04*  --   --   --     Estimated Creatinine Clearance: 56.6 mL/min (A) (by C-G formula based on SCr of 1.42 mg/dL (H)).   Medical History: Past Medical History:  Diagnosis Date  . AICD (automatic cardioverter/defibrillator) present   . Anemia   . Anxiety   . CHF (congestive heart failure) ([place])   . Chronic renal insufficiency   . Coronary artery disease   . Depression   . Dyslipidemia   . Dysrhythmia    atrial fibrillation  . ED (erectile dysfunction)   . GERD (gastroesophageal reflux disease)   . Incisional hernia, without obstruction or gangrene 04/10/2017  . Ischemic cardiomyopathy    EF 23%  . MI (myocardial infarction) ([place]) 2000   ANTERIOR, s/p PCI  . Other primary cardiomyopathies   . Peripheral vascular disease ([place])    dvt legs 2017  . Persistent atrial fibrillation (HCC)    on coumadin  . Presence of permanent cardiac pacemaker   . Shortness of breath dyspnea    on exertion    Medications:  No current facility-administered medications on file prior to encounter.    Current Outpatient  Prescriptions on File Prior to Encounter  Medication Sig Dispense Refill  . acetaminophen (TYLENOL) 500 MG tablet Take 500 mg by mouth every 8 (eight) hours as needed for mild pain.    Marland Kitchen digoxin (LANOXIN) 0.125 MG tablet Take 0.125 mg by mouth daily.    . fenofibrate 160 MG tablet TAKE 1 TABLET BY MOUTH  DAILY (Patient taking differently: TAKE 160 mg TABLET BY MOUTH  DAILY) 90 tablet 3  . ferrous sulfate 325 (65 FE) MG tablet Take 325 mg by mouth 2 (two) times daily.    . folic acid (FOLVITE) 841 MCG tablet Take 800 mcg by mouth daily.     . furosemide (LASIX) 40 MG tablet Take 1 tablet (40 mg total) by mouth daily. 90 tablet 3  . losartan (COZAAR) 25 MG tablet Take 1 tablet (25 mg total) by mouth daily. 30 tablet 3  . metoprolol succinate (TOPROL-XL) 25 MG 24 hr tablet Take 1 tablet (25 mg total) by mouth daily at 12 noon. 90 tablet 0  . Multiple Vitamins-Minerals (MULTIVITAMIN ADULTS PO) Take 1 tablet by mouth daily.    . nitroGLYCERIN (NITROSTAT) 0.4  MG SL tablet Place 1 tablet (0.4 mg total) under the tongue every 5 (five) minutes as needed for chest pain. 25 tablet 3  . pantoprazole (PROTONIX) 40 MG tablet Take 40 mg by mouth daily.     . pantoprazole (PROTONIX) 40 MG tablet TAKE 2 TABLETS BY MOUTH  DAILY (Patient taking differently: TAKE 40 mg TABLETS BY MOUTH  DAILY) 180 tablet 3  . potassium chloride SA (K-DUR,KLOR-CON) 20 MEQ tablet Take 20 mEq by mouth daily.    . pravastatin (PRAVACHOL) 40 MG tablet TAKE 1 TABLET BY MOUTH  DAILY (Patient taking differently: TAKE 40 mg TABLET BY MOUTH  DAILY AT BEDTIME) 90 tablet 3  . sertraline (ZOLOFT) 100 MG tablet Take 100 mg by mouth daily.      . vitamin C (ASCORBIC ACID) 250 MG tablet Take 250 mg by mouth 2 (two) times daily.    Marland Kitchen warfarin (COUMADIN) 5 MG tablet Take 0.5 tablets (2.5 mg total) by mouth daily at 6 PM. (Patient taking differently: Take 2.5 mg by mouth at bedtime. Currently 2.5 mg tablet 7 days weekly) 30 tablet 0  . metoprolol  succinate (TOPROL-XL) 25 MG 24 hr tablet TAKE 1 TABLET BY MOUTH  DAILY AT 12 NOON. (Patient not taking: Reported on 09/29/2017) 90 tablet 3     Assessment: 71 y.o. male with h/o Afib, to continue Coumadin per Pharmacy from pta. INR remains therapeutic at 2.88. Appears patient missed dose 10/12 and also noted on levaquin  PTA warfarin dose: 2.5mg  daily per last anticoag clinic note (last dose 10/11 pta)  Goal of Therapy:  INR 2-3 Monitor platelets by anticoagulation protocol: Yes   Plan:  Warfarin 2.5mg  po daily Daily INR - watch closely on Levaquin, may need to reduce dose  Hildred Laser, Pharm D 10/02/2017 8:04 AM

## 2017-10-02 NOTE — Progress Notes (Signed)
Patient with no complaints or concerns during the night. Vital signs stable, temperature within normal range.  Will continue to monitor.  Arbell Wycoff, RN

## 2017-10-02 NOTE — Discharge Summary (Signed)
Physician Discharge Summary  [name]:[id number] DOB: 20-Jul-1946 DOA: 09/29/2017  PCP: Christain Sacramento, MD  Admit date: 09/29/2017 Discharge date: 10/02/2017  Admitted From: Home Disposition:  Home  Recommendations for Outpatient Follow-up:  1. Follow up with PCP in 1week with repeat BMP/INR 2. Follow up with Cardiology/Dr. Peter Martinique in 1-2 weeks 3. Decrease Lasix to 20 mg daily till seen by primary care provider   Home Health: no  Equipment/Devices: none  Discharge Condition: Stable CODE STATUS: Full  Diet recommendation: Heart Healthy / Fluid restriction upto 1212ml per day  Brief/Interim Summary: 71 year old male with history of CAD, atrial fibrillation on chronic anticoagulation, systolic CHF with EF of 25-05% in 01/06/2016 status post AICD, DVT, PVD presented with weakness. He was admitted with fever along with acute kidney injury and was started on IV fluids and IV antibiotics. Patient was initially started on IV Levaquin, aztreonam and vancomycin.Vancomycin and aztreonam were subsequently discontinued. Renal function is improved. Patient is currently afebrile. he'll be discharged home.  Discharge Diagnoses:  Principal Problem:   Sepsis ([place]) Active Problems:   Essential hypertension   Chronic systolic CHF (congestive heart failure) (HCC)   Biventricular implantable cardioverter-defibrillator Medtronic   Chronic atrial fibrillation (HCC)   Chronic anticoagulation-Couamdin   Acute renal failure superimposed on chronic kidney disease (HCC)  SIRS - no obvious source. Doubt that the patient had sepsis.  - initially started on vancomycin, Levaquin and aztreonam. Vancomycin and aztreonam were discontinued on 10/01/2017. Cultures are negative so far. Patient is hemodynamically stable with no temperature spikes over the last 24 hours and normal white count. - renal function is improving. Off IV fluids -Discharge home on Levaquin for 5 more days.  Leukocytosis -  resolved  Minimally elevated troponin - Subsequent troponins did not elevate. Patient did not have any chest pain. - Echo showed ejection fraction of 25-30% - outpatient follow-up with cardiology; patient is agreeable to that  Acute renal failuresuperimposed on chronic kidney disease stage LZJ:QBHALPFX creatinine previously 1.3-1.4, but patient presented with BUN 44 and creatinine 2.71.  - creatinine is 1.42  Today. off IV fluids -  renal ultrasound was negative for hydronephrosis - resume Lasix 20 mg daily and outpatient follow-up of BMP  Atrial fibrillation with chronic anticoagulation :  - continue Coumadin. Outpatient INR follow-up  Chronic Systolic CHF, s/p AICD:  - Compensated. Outpatient follow-up with cardiology. Continue metoprolol, losartan and digoxin. Resume Lasix at 20 mg daily  Essential hypertension - monitor blood pressure. Outpatient follow-up  Hyperlipidemia - Continue pravastatin  GERD - Continue Protonix   Discharge Instructions  Discharge Instructions    (HEART FAILURE PATIENTS) Call MD:  Anytime you have any of the following symptoms: 1) 3 pound weight gain in 24 hours or 5 pounds in 1 week 2) shortness of breath, with or without a dry hacking cough 3) swelling in the hands, feet or stomach 4) if you have to sleep on extra pillows at night in order to breathe.    Complete by:  As directed    Ambulatory referral to Cardiology    Complete by:  As directed    Follow up in 1-2 weeks   Call MD for:  difficulty breathing, headache or visual disturbances    Complete by:  As directed    Call MD for:  extreme fatigue    Complete by:  As directed    Call MD for:  hives    Complete by:  As directed    Call MD  for:  persistant dizziness or light-headedness    Complete by:  As directed    Call MD for:  persistant nausea and vomiting    Complete by:  As directed    Call MD for:  severe uncontrolled pain    Complete by:  As directed    Call MD for:   temperature >100.4    Complete by:  As directed    Diet - low sodium heart healthy    Complete by:  As directed    Increase activity slowly    Complete by:  As directed      Allergies as of 10/02/2017      Reactions   Keflex [cephalexin] Shortness Of Breath, Other (See Comments)   dizziness      Medication List    TAKE these medications   acetaminophen 500 MG tablet Commonly known as:  TYLENOL Take 500 mg by mouth every 8 (eight) hours as needed for mild pain.   digoxin 0.125 MG tablet Commonly known as:  LANOXIN Take 0.125 mg by mouth daily.   fenofibrate 160 MG tablet TAKE 1 TABLET BY MOUTH  DAILY What changed:  See the new instructions.   ferrous sulfate 325 (65 FE) MG tablet Take 325 mg by mouth 2 (two) times daily.   folic acid 696 MCG tablet Commonly known as:  FOLVITE Take 800 mcg by mouth daily.   furosemide 40 MG tablet Commonly known as:  LASIX Take 0.5 tablets (20 mg total) by mouth daily. What changed:  how much to take   levofloxacin 500 MG tablet Commonly known as:  LEVAQUIN Take 1 tablet (500 mg total) by mouth daily.   losartan 25 MG tablet Commonly known as:  COZAAR Take 1 tablet (25 mg total) by mouth daily.   metoprolol succinate 25 MG 24 hr tablet Commonly known as:  TOPROL-XL Take 1 tablet (25 mg total) by mouth daily at 12 noon. What changed:  Another medication with the same name was removed. Continue taking this medication, and follow the directions you see here.   MULTIVITAMIN ADULTS PO Take 1 tablet by mouth daily.   nitroGLYCERIN 0.4 MG SL tablet Commonly known as:  NITROSTAT Place 1 tablet (0.4 mg total) under the tongue every 5 (five) minutes as needed for chest pain.   pantoprazole 40 MG tablet Commonly known as:  PROTONIX Take 40 mg by mouth daily. What changed:  Another medication with the same name was removed. Continue taking this medication, and follow the directions you see here.   potassium chloride SA 20 MEQ  tablet Commonly known as:  K-DUR,KLOR-CON Take 20 mEq by mouth daily.   pravastatin 40 MG tablet Commonly known as:  PRAVACHOL TAKE 40 mg TABLET BY MOUTH  DAILY AT BEDTIME   sertraline 100 MG tablet Commonly known as:  ZOLOFT Take 100 mg by mouth daily.   vitamin C 250 MG tablet Commonly known as:  ASCORBIC ACID Take 250 mg by mouth 2 (two) times daily.   warfarin 5 MG tablet Commonly known as:  COUMADIN Take 0.5 tablets (2.5 mg total) by mouth daily at 6 PM. What changed:  when to take this  additional instructions      Follow-up Information    Christain Sacramento, MD Follow up in 1 week(s).   Specialty:  Family Medicine Why:  with repeat BMP Contact information: [address] [place] [address] [phone]        Martinique, Peter M, MD. Schedule  an appointment as soon as possible for a visit in 2 week(s).   Specialty:  Cardiology Contact information: [address] [place] [address] [phone]          Allergies  Allergen Reactions  . Keflex [Cephalexin] Shortness Of Breath and Other (See Comments)    dizziness    Consultations:  none   Procedures/Studies: Dg Chest 2 View  Result Date: 09/29/2017 CLINICAL DATA:  Cough, congestion, shortness of breath. EXAM: CHEST  2 VIEW COMPARISON:  02/24/2016 FINDINGS: Stable in position cardiac pacemaker. Enlarged cardiac silhouette. Mediastinal contours appear intact. Calcific atherosclerotic disease and tortuosity of the aorta. There is no evidence of focal airspace consolidation, pleural effusion or pneumothorax. Osseous structures are without acute abnormality. Soft tissues are grossly normal. IMPRESSION: Enlarged cardiac silhouette. Calcific atherosclerotic disease and tortuosity of the aorta. Electronically Signed   By: Fidela Salisbury M.D.   On: 09/29/2017 22:14   US Renal  Result Date: 09/30/2017 CLINICAL DATA:  Renal failure. EXAM: RENAL / URINARY TRACT ULTRASOUND COMPLETE  COMPARISON:  CT abdomen and pelvis dated November 07, 2016. FINDINGS: Right Kidney: Length: 12.5 cm. Mildly increased echogenicity. No mass or hydronephrosis visualized. Several simple cysts are seen, the largest measuring 4.3 x 2.7 x 3.4 cm. Left Kidney: Length: 11.7 cm. Mildly increased echogenicity. No mass or hydronephrosis visualized. Several simple cysts are seen, the largest measuring 4.0 x 3.1 x 3.1 cm. Bladder: Appears normal for degree of bladder distention. IMPRESSION: Mildly increased renal cortical echogenicity, consistent with medical renal disease. No hydronephrosis or solid mass. Electronically Signed   By: Titus Dubin M.D.   On: 09/30/2017 09:39  Echo on 09/30/2017 Study Conclusions  - Left ventricle: The cavity size was normal. Systolic function was severely reduced. The estimated ejection fraction was in the range of 25% to 30%. There is akinesis of the apical anterior, lateral, inferolateral, inferior, and apical myocardium with no evidence of apical thrombus by definity contrast. The study is not technically sufficient to allow evaluation of LV diastolic function. Doppler parameters are consistent with high ventricular filling pressure. - Mitral valve: There was mild regurgitation. - Left atrium: The atrium was mildly dilated. - Tricuspid valve: There was mild-moderate regurgitation. - Pulmonic valve: There was trivial regurgitation. - Pulmonary arteries: PA peak pressure: 37 mm Hg (S).  Impressions:  - The right ventricular systolic pressure was increased consistent with mild pulmonary hypertension.    Subjective: Patient seen and examined at bedside. He feels much better. No overnight fevers but he had some chills. No nausea or vomiting.  Discharge Exam: Vitals:   10/02/17 0455 10/02/17 0623  BP: 131/69   Pulse: 70   Resp: 18   Temp: 98 F (36.7 C) 98.8 F (37.1 C)  SpO2: 99%    Vitals:   10/01/17 2004 10/02/17 0104 10/02/17 0455  10/02/17 0623  BP: (!) 134/55 108/62 131/69   Pulse: 74 70 70   Resp: 18 18 18    Temp: 98.3 F (36.8 C) 98.2 F (36.8 C) 98 F (36.7 C) 98.8 F (37.1 C)  TempSrc: Oral Oral Oral Oral  SpO2: 99% 98% 99%   Weight:   94.3 kg (207 lb 14.4 oz)   Height:        General: Pt is alert, awake, not in acute distress Cardiovascular: rate controlled, S1/S2 + Respiratory:bilateral decreased breath sounds bases Abdominal: Soft, NT, ND, bowel sounds + Extremities: Trace edema, no cyanosis    The results of significant diagnostics from this  hospitalization (including imaging, microbiology, ancillary and laboratory) are listed below for reference.     Microbiology: Recent Results (from the past 240 hour(s))  Respiratory Panel by PCR     Status: None   Collection Time: 09/29/17 11:10 PM  Result Value Ref Range Status   Adenovirus NOT DETECTED NOT DETECTED Final   Coronavirus 229E NOT DETECTED NOT DETECTED Final   Coronavirus HKU1 NOT DETECTED NOT DETECTED Final   Coronavirus NL63 NOT DETECTED NOT DETECTED Final   Coronavirus OC43 NOT DETECTED NOT DETECTED Final   Metapneumovirus NOT DETECTED NOT DETECTED Final   Rhinovirus / Enterovirus NOT DETECTED NOT DETECTED Final   Influenza A NOT DETECTED NOT DETECTED Final   Influenza B NOT DETECTED NOT DETECTED Final   Parainfluenza Virus 1 NOT DETECTED NOT DETECTED Final   Parainfluenza Virus 2 NOT DETECTED NOT DETECTED Final   Parainfluenza Virus 3 NOT DETECTED NOT DETECTED Final   Parainfluenza Virus 4 NOT DETECTED NOT DETECTED Final   Respiratory Syncytial Virus NOT DETECTED NOT DETECTED Final   Bordetella pertussis NOT DETECTED NOT DETECTED Final   Chlamydophila pneumoniae NOT DETECTED NOT DETECTED Final   Mycoplasma pneumoniae NOT DETECTED NOT DETECTED Final  Culture, blood (routine x 2)     Status: None (Preliminary result)   Collection Time: 09/30/17 12:30 AM  Result Value Ref Range Status   Specimen Description BLOOD RIGHT FOREARM   Final   Special Requests   Final    BOTTLES DRAWN AEROBIC AND ANAEROBIC Blood Culture adequate volume   Culture NO GROWTH 1 DAY  Final   Report Status PENDING  Incomplete  Culture, blood (routine x 2)     Status: None (Preliminary result)   Collection Time: 09/30/17  5:15 AM  Result Value Ref Range Status   Specimen Description BLOOD BLOOD LEFT HAND  Final   Special Requests   Final    BOTTLES DRAWN AEROBIC ONLY Blood Culture adequate volume   Culture NO GROWTH 1 DAY  Final   Report Status PENDING  Incomplete     Labs: BNP (last 3 results) No results for input(s): BNP in the last 8760 hours. Basic Metabolic Panel:  Recent Labs Lab 09/29/17 2123 09/30/17 0408 09/30/17 1436 10/01/17 0357 10/01/17 1136 10/02/17 0438  NA 136 139 133*  --  139 139  K 3.7 4.5 3.5  --  3.9 3.7  CL 102 104 103  --  109 106  CO2 21* 24 22  --  23 25  GLUCOSE 101* 137* 94  --  89 90  BUN 44* 41* 35*  --  22* 18  CREATININE 2.71* 2.40* 1.81*  --  1.28* 1.42*  CALCIUM 8.8* 8.9 8.6*  --  9.0 9.1  MG  --   --   --  1.9  --  1.7   Liver Function Tests:  Recent Labs Lab 09/29/17 2123 09/30/17 1436  AST 42* 34  ALT 20 22  ALKPHOS 49 36*  BILITOT 1.5* 0.9  PROT 6.0* 6.0*  ALBUMIN 3.3* 2.8*    Recent Labs Lab 09/29/17 2123  LIPASE 23   No results for input(s): AMMONIA in the last 168 hours. CBC:  Recent Labs Lab 09/29/17 2123 09/30/17 0408 10/01/17 0357 10/02/17 0438  WBC 16.6* 25.7* 13.4* 8.5  NEUTROABS  --   --  10.7* 5.8  HGB 11.9* 11.1* 10.6* 10.2*  HCT 35.8* 34.8* 34.0* 32.2*  MCV 81.7 83.1 84.2 83.4  PLT 153 136* 140* 149*   Cardiac Enzymes:  Recent Labs Lab 09/29/17 2123 09/30/17 0905 09/30/17 1436 09/30/17 1936  TROPONINI 0.08* 0.06* 0.05* 0.04*   BNP: Invalid input(s): POCBNP CBG: No results for input(s): GLUCAP in the last 168 hours. D-Dimer No results for input(s): DDIMER in the last 72 hours. Hgb A1c No results for input(s): HGBA1C in the last 72  hours. Lipid Profile No results for input(s): CHOL, HDL, LDLCALC, TRIG, CHOLHDL, LDLDIRECT in the last 72 hours. Thyroid function studies No results for input(s): TSH, T4TOTAL, T3FREE, THYROIDAB in the last 72 hours.  Invalid input(s): FREET3 Anemia work up No results for input(s): VITAMINB12, FOLATE, FERRITIN, TIBC, IRON, RETICCTPCT in the last 72 hours. Urinalysis    Component Value Date/Time   COLORURINE AMBER (A) 09/29/2017 2310   APPEARANCEUR HAZY (A) 09/29/2017 2310   LABSPEC 1.013 09/29/2017 2310   PHURINE 5.0 09/29/2017 2310   GLUCOSEU NEGATIVE 09/29/2017 2310   HGBUR NEGATIVE 09/29/2017 2310   BILIRUBINUR NEGATIVE 09/29/2017 2310   KETONESUR NEGATIVE 09/29/2017 2310   PROTEINUR NEGATIVE 09/29/2017 2310   NITRITE NEGATIVE 09/29/2017 2310   LEUKOCYTESUR NEGATIVE 09/29/2017 2310   Sepsis Labs Invalid input(s): PROCALCITONIN,  WBC,  LACTICIDVEN Microbiology Recent Results (from the past 240 hour(s))  Respiratory Panel by PCR     Status: None   Collection Time: 09/29/17 11:10 PM  Result Value Ref Range Status   Adenovirus NOT DETECTED NOT DETECTED Final   Coronavirus 229E NOT DETECTED NOT DETECTED Final   Coronavirus HKU1 NOT DETECTED NOT DETECTED Final   Coronavirus NL63 NOT DETECTED NOT DETECTED Final   Coronavirus OC43 NOT DETECTED NOT DETECTED Final   Metapneumovirus NOT DETECTED NOT DETECTED Final   Rhinovirus / Enterovirus NOT DETECTED NOT DETECTED Final   Influenza A NOT DETECTED NOT DETECTED Final   Influenza B NOT DETECTED NOT DETECTED Final   Parainfluenza Virus 1 NOT DETECTED NOT DETECTED Final   Parainfluenza Virus 2 NOT DETECTED NOT DETECTED Final   Parainfluenza Virus 3 NOT DETECTED NOT DETECTED Final   Parainfluenza Virus 4 NOT DETECTED NOT DETECTED Final   Respiratory Syncytial Virus NOT DETECTED NOT DETECTED Final   Bordetella pertussis NOT DETECTED NOT DETECTED Final   Chlamydophila pneumoniae NOT DETECTED NOT DETECTED Final   Mycoplasma pneumoniae  NOT DETECTED NOT DETECTED Final  Culture, blood (routine x 2)     Status: None (Preliminary result)   Collection Time: 09/30/17 12:30 AM  Result Value Ref Range Status   Specimen Description BLOOD RIGHT FOREARM  Final   Special Requests   Final    BOTTLES DRAWN AEROBIC AND ANAEROBIC Blood Culture adequate volume   Culture NO GROWTH 1 DAY  Final   Report Status PENDING  Incomplete  Culture, blood (routine x 2)     Status: None (Preliminary result)   Collection Time: 09/30/17  5:15 AM  Result Value Ref Range Status   Specimen Description BLOOD BLOOD LEFT HAND  Final   Special Requests   Final    BOTTLES DRAWN AEROBIC ONLY Blood Culture adequate volume   Culture NO GROWTH 1 DAY  Final   Report Status PENDING  Incomplete     Time coordinating discharge: 35 minutes  SIGNED:   Aline August, MD  [facility] 10/02/2017, 9:46 AM Pager: [phone]  If 7PM-7AM, please contact night-coverage [email or web address] Password [email or web address]

## 2017-10-02 NOTE — Discharge Instructions (Signed)
Decrease Lasix to 20mg  daily till seen by PCP

## 2017-10-03 LAB — BLOOD CULTURE ID PANEL (REFLEXED)
Acinetobacter baumannii: NOT DETECTED
CANDIDA GLABRATA: NOT DETECTED
CANDIDA TROPICALIS: NOT DETECTED
Candida albicans: NOT DETECTED
Candida krusei: NOT DETECTED
Candida parapsilosis: NOT DETECTED
ENTEROBACTER CLOACAE COMPLEX: NOT DETECTED
Enterobacteriaceae species: NOT DETECTED
Enterococcus species: NOT DETECTED
Escherichia coli: NOT DETECTED
Haemophilus influenzae: NOT DETECTED
KLEBSIELLA PNEUMONIAE: NOT DETECTED
Klebsiella oxytoca: NOT DETECTED
Listeria monocytogenes: NOT DETECTED
NEISSERIA MENINGITIDIS: NOT DETECTED
PROTEUS SPECIES: NOT DETECTED
Pseudomonas aeruginosa: NOT DETECTED
SERRATIA MARCESCENS: NOT DETECTED
STAPHYLOCOCCUS AUREUS BCID: NOT DETECTED
STAPHYLOCOCCUS SPECIES: NOT DETECTED
STREPTOCOCCUS AGALACTIAE: NOT DETECTED
Streptococcus pneumoniae: NOT DETECTED
Streptococcus pyogenes: NOT DETECTED
Streptococcus species: DETECTED — AB

## 2017-10-04 ENCOUNTER — Telehealth: Payer: Self-pay | Admitting: Cardiology

## 2017-10-04 LAB — CULTURE, BLOOD (ROUTINE X 2): Special Requests: ADEQUATE

## 2017-10-04 NOTE — Telephone Encounter (Signed)
Please call pt to see what is going on with him. Weight is up 5.2 lbs in the last 5 days.His diuretic was decreased in the hospital from 40mg  to 20mg . Sonia Baller will fax over information to you.

## 2017-10-04 NOTE — Telephone Encounter (Signed)
Spoke to patient's wife.She stated husband is presently taking a shower.Stated he is feeling better.No sob.Weight today 207 lbs.Stated he has not gained 5.2 lbs.Stated he weighs daily and weight automatically goes to [facility].She does not know how they got his weight wrong.Advised to keep post hospital appointment with Almyra Deforest PA 10/17/17 at 9:30 am.

## 2017-10-04 NOTE — Telephone Encounter (Signed)
[name] says she will not be available from 2:00 to 5:00-She says just go ahead and call the patient.

## 2017-10-04 NOTE — Telephone Encounter (Signed)
Returned call to [place] with [facility].[place]

## 2017-10-05 LAB — CULTURE, BLOOD (ROUTINE X 2)
CULTURE: NO GROWTH
SPECIAL REQUESTS: ADEQUATE

## 2017-10-09 ENCOUNTER — Telehealth: Payer: Self-pay | Admitting: Physician Assistant

## 2017-10-09 NOTE — Telephone Encounter (Signed)
Received notes from [facility] for appointment on 10/17/17 with Almyra Deforest, PA.  Records put with Hao's schedule for 10/17/17. lp

## 2017-10-17 ENCOUNTER — Ambulatory Visit (INDEPENDENT_AMBULATORY_CARE_PROVIDER_SITE_OTHER): Payer: Medicare Other | Admitting: Physician Assistant

## 2017-10-17 ENCOUNTER — Encounter: Payer: Self-pay | Admitting: Physician Assistant

## 2017-10-17 VITALS — BP 120/68 | HR 71 | Ht 70.0 in | Wt 199.6 lb

## 2017-10-17 DIAGNOSIS — E785 Hyperlipidemia, unspecified: Secondary | ICD-10-CM

## 2017-10-17 DIAGNOSIS — Z9861 Coronary angioplasty status: Secondary | ICD-10-CM

## 2017-10-17 DIAGNOSIS — N289 Disorder of kidney and ureter, unspecified: Secondary | ICD-10-CM | POA: Diagnosis not present

## 2017-10-17 DIAGNOSIS — Z95810 Presence of automatic (implantable) cardiac defibrillator: Secondary | ICD-10-CM

## 2017-10-17 DIAGNOSIS — I2510 Atherosclerotic heart disease of native coronary artery without angina pectoris: Secondary | ICD-10-CM

## 2017-10-17 DIAGNOSIS — I481 Persistent atrial fibrillation: Secondary | ICD-10-CM

## 2017-10-17 DIAGNOSIS — N189 Chronic kidney disease, unspecified: Secondary | ICD-10-CM | POA: Diagnosis not present

## 2017-10-17 DIAGNOSIS — I255 Ischemic cardiomyopathy: Secondary | ICD-10-CM

## 2017-10-17 DIAGNOSIS — Z9889 Other specified postprocedural states: Secondary | ICD-10-CM | POA: Diagnosis not present

## 2017-10-17 DIAGNOSIS — I4819 Other persistent atrial fibrillation: Secondary | ICD-10-CM

## 2017-10-17 LAB — BASIC METABOLIC PANEL
BUN / CREAT RATIO: 17 (ref 10–24)
BUN: 28 mg/dL — AB (ref 8–27)
CALCIUM: 10 mg/dL (ref 8.6–10.2)
CHLORIDE: 101 mmol/L (ref 96–106)
CO2: 27 mmol/L (ref 20–29)
CREATININE: 1.62 mg/dL — AB (ref 0.76–1.27)
GFR calc non Af Amer: 43 mL/min/{1.73_m2} — ABNORMAL LOW (ref >59)
GFR, EST AFRICAN AMERICAN: 49 mL/min/{1.73_m2} — AB (ref >59)
Glucose: 89 mg/dL (ref 65–99)
Potassium: 4.9 mmol/L (ref 3.5–5.2)
Sodium: 143 mmol/L (ref 134–144)

## 2017-10-17 NOTE — Patient Instructions (Addendum)
Heart failure management: 1. Avoid salt 2. Limit daily fluid intake to between 32 and 64 oz 3. Weigh yourself every morning, call cardiology if your weight change by more than 3 lbs overnight or 5 lbs in a single week.  Medication Instructions:  Your physician recommends that you continue on your current medications as directed. Please refer to the Current Medication list given to you today.  Labwork: Your physician recommends that you return for lab work in: BMP--Today  Testing/Procedures: None   Follow-Up: Your physician recommends that you schedule a follow-up appointment in: 1 month with Dr Martinique  Any Other Special Instructions Will Be Listed Below (If Applicable).  If you need a refill on your cardiac medications before your next appointment, please call your pharmacy.

## 2017-10-17 NOTE — Progress Notes (Signed)
Cardiology Office Note    Date:  10/19/2017   ID:  Amory, Simonetti [birth year], MRN [id number]  PCP:  Christain Sacramento, MD  Cardiologist:  Dr. Martinique  Chief Complaint  Patient presents with  . Follow-up    seen for Dr. Martinique.    History of Present Illness:  [name] is a 71 y.o. male with PMH of CAD s/p remote anterior MI, ICM s/p CRT-D, atrial fibrillation, and HLD. He had generator change placed in September 2014. His intolerant of amiodarone. His QT interval was too long for Tikosyn. He previously attempted to increase his carvedilol dose, however developed significant hypotension. He was admitted in March 2017 for perforated duodenal ulcer with GI bleed, this was surgically repaired with Phillip Heal patch. He also required blood transfusion as well. Some of his medications were held due to hypotension. He had bilateral distal DVTs. He was noted to have some esophagitis and nodular irregularities of gastric mucosa on EGD in September 2017. Colonoscopy revealed hemorrhoids, diverticuli and a 5 cm polyp. Biopsy negative for malignancy. In October 2017, he had volume overloaded indicated by his Optival reading, Lasix was increased to 40 mg twice a day. He was started on Entresto but developed hypotension, he was later transitioned back to losartan. Due to persistent atrial fibrillation with elevated heart rate, he underwent AV nodal ablation in February 2018. He was last seen by Dr. Martinique in June 2018, at which time he was doing well.  Recently he was admitted to the hospital on 09/30/2017 with renal insufficiency. He reported episode of weakness. He was also treated for SIRS and sepsis with MAXIMUM TEMPERATURE 102 and tachypnea. White blood cell count was 16.6. Initial lactic acid 2.14. He was hydrated with IV fluid. Troponin was borderline elevated, this was felt to be related to elevation of creatinine up to 2.71. He was treated with vancomycin, Levaquin and aztreonam. Culture was  negative. Repeat echocardiogram obtained on 10/01/2017 continues showed EF 25-30%, mild MR, mild-to-moderate TR, akinesis of the apical anterior, lateral, inferolateral, inferior, apical myocardium with no evidence of apical thrombus by Definity contrast, PA peak pressure 37 mmHg.  Patient presents today for cardiology office visit. He is feeling well after recent discharge. He denies any significant chest discomfort or shortness of breath. He is blood pressure is stable. There is no significant lower extremity edema on physical exam. I will obtain a basic metabolic panel today. Otherwise he has no orthopnea or paroxysmal nocturnal dyspnea. I am somewhat concerned of another dehydration episode as his weight decreased by a pounds since he left the hospital. We will adjust the diuretic based on the basic metabolic panel results.    Past Medical History:  Diagnosis Date  . AICD (automatic cardioverter/defibrillator) present   . Anemia   . Anxiety   . CHF (congestive heart failure) ([place])   . Chronic renal insufficiency   . Coronary artery disease   . Depression   . Dyslipidemia   . Dysrhythmia    atrial fibrillation  . ED (erectile dysfunction)   . GERD (gastroesophageal reflux disease)   . Incisional hernia, without obstruction or gangrene 04/10/2017  . Ischemic cardiomyopathy    EF 23%  . MI (myocardial infarction) ([place]) 2000   ANTERIOR, s/p PCI  . Other primary cardiomyopathies   . Peripheral vascular disease ([place])    dvt legs 2017  . Persistent atrial fibrillation ([facility])    on coumadin  . Presence of permanent cardiac pacemaker   .  Shortness of breath dyspnea    on exertion    Past Surgical History:  Procedure Laterality Date  . AV NODE ABLATION N/A 01/25/2017   Procedure: AV Node Ablation;  Surgeon: [name], MD;  Location: [facility];  Service: Cardiovascular;  Laterality: N/A;  . AV NODE ABLATION  01/25/2017  . BIV ICD GENERTAOR CHANGE OUT N/A 09/16/2013    Procedure: BIV ICD GENERTAOR CHANGE OUT;  Surgeon: [name], MD;  Location: [facility] CATH LAB;  Service: Cardiovascular;  Laterality: N/A;  . BREAST LUMPECTOMY Left   . CARDIAC CATHETERIZATION  02/05/2008   MODERATE TO SEVERE LEFT VENTRICULAR  DYSFUNCTION WITH EF 25-30%  . CARDIAC DEFIBRILLATOR PLACEMENT  2009   MDT BI-VENTRICULAR ICD by Dr Caryl Comes  . COLONOSCOPY WITH PROPOFOL N/A 08/31/2016   Procedure: COLONOSCOPY WITH PROPOFOL;  Surgeon: Wilford Corner, MD;  Location: [facility] ENDOSCOPY;  Service: Endoscopy;  Laterality: N/A;  . CORONARY ANGIOPLASTY  2000   stent  . CORONARY STENT PLACEMENT     LAD  . CYSTOSCOPY N/A 01/25/2016   Procedure: CYSTOSCOPY;  Surgeon: Carolan Clines, MD;  Location: [facility];  Service: Urology;  Laterality: N/A;  . ESOPHAGOGASTRODUODENOSCOPY (EGD) WITH PROPOFOL N/A 08/31/2016   Procedure: ESOPHAGOGASTRODUODENOSCOPY (EGD) WITH PROPOFOL;  Surgeon: Wilford Corner, MD;  Location: [facility] ENDOSCOPY;  Service: Endoscopy;  Laterality: N/A;  . FRACTURE SURGERY Right 1972   leg  . IMPLANTABLE CARDIOVERTER DEFIBRILLATOR GENERATOR CHANGE  2014  . INCISIONAL HERNIA REPAIR N/A 04/10/2017   Procedure: OPEN REPAIR INCISIONAL VENTRAL HERNIA;  Surgeon: [name], MD;  Location: [facility];  Service: General;  Laterality: N/A;  . INSERT / REPLACE / REMOVE PACEMAKER     ICD  . INSERTION OF MESH N/A 04/10/2017   Procedure: INSERTION OF MESH;  Surgeon: [name], MD;  Location: [place] [facility];  Service: General;  Laterality: N/A;  . LAPAROTOMY N/A 02/22/2016   Procedure: EXPLORATORY LAPAROTOMY WITH  PATCH OF DUODENAL ULCER;  Surgeon: Rolm Bookbinder, MD;  Location: [facility];  Service: General;  Laterality: N/A;  . MULTIPLE TOOTH EXTRACTIONS    . TRANSTHORACIC ECHOCARDIOGRAM  12/2010   EF 30-35%  . TRANSURETHRAL RESECTION OF PROSTATE N/A 01/25/2016   Procedure: TRANSURETHRAL RESECTION OF THE PROSTATE (TURP);  Surgeon: Carolan Clines, MD;  Location: [facility];  Service: Urology;  Laterality: N/A;  .  WRIST FRACTURE SURGERY Left 1972    Current Medications: Outpatient Medications Prior to Visit  Medication Sig Dispense Refill  . acetaminophen (TYLENOL) 500 MG tablet Take 500 mg by mouth every 8 (eight) hours as needed for mild pain.    Marland Kitchen digoxin (LANOXIN) 0.125 MG tablet Take 0.0625 mg by mouth daily.     . fenofibrate 160 MG tablet TAKE 1 TABLET BY MOUTH  DAILY (Patient taking differently: TAKE 160 mg TABLET BY MOUTH  DAILY) 90 tablet 3  . ferrous sulfate 325 (65 FE) MG tablet Take 325 mg by mouth 2 (two) times daily.    . folic acid (FOLVITE) 161 MCG tablet Take 800 mcg by mouth daily.     . furosemide (LASIX) 40 MG tablet Take 0.5 tablets (20 mg total) by mouth daily.    Marland Kitchen losartan (COZAAR) 25 MG tablet Take 1 tablet (25 mg total) by mouth daily. 30 tablet 3  . metoprolol succinate (TOPROL-XL) 25 MG 24 hr tablet Take 1 tablet (25 mg total) by mouth daily at 12 noon. 90 tablet 0  . Multiple Vitamins-Minerals (MULTIVITAMIN ADULTS PO) Take 1 tablet by  mouth daily.    . pantoprazole (PROTONIX) 40 MG tablet Take 40 mg by mouth 2 (two) times daily.     . potassium chloride SA (K-DUR,KLOR-CON) 20 MEQ tablet Take 20 mEq by mouth daily.    . pravastatin (PRAVACHOL) 40 MG tablet TAKE 40 mg TABLET BY MOUTH  DAILY AT BEDTIME    . sertraline (ZOLOFT) 100 MG tablet Take 100 mg by mouth daily.      . vitamin C (ASCORBIC ACID) 250 MG tablet Take 250 mg by mouth 2 (two) times daily.    Marland Kitchen warfarin (COUMADIN) 5 MG tablet Take 0.5 tablets (2.5 mg total) by mouth daily at 6 PM. (Patient taking differently: Take 2.5 mg by mouth at bedtime. Currently 2.5 mg tablet 7 days weekly) 30 tablet 0  . nitroGLYCERIN (NITROSTAT) 0.4 MG SL tablet Place 1 tablet (0.4 mg total) under the tongue every 5 (five) minutes as needed for chest pain. 25 tablet 3   No facility-administered medications prior to visit.      Allergies:   Keflex [cephalexin]   Social History   Social History  . Marital status: Married     Spouse name: N/A  . Number of children: 2  . Years of education: N/A   Occupational History  . disabled Retired   Social History Main Topics  . Smoking status: Former Smoker    Types: Cigarettes    Quit date: 12/23/1998  . Smokeless tobacco: Never Used  . Alcohol use No  . Drug use: No  . Sexual activity: Not Asked   Other Topics Concern  . None   Social History Narrative   Lives in [place].  Retired Teacher, English as a foreign language           Family History:  The patient's family history includes Breast cancer in his mother and sister; Depression in his mother; Heart attack in his father.   ROS:   Please see the history of present illness.    ROS All other systems reviewed and are negative.   PHYSICAL EXAM:   VS:  BP 120/68   Pulse 71   Ht 5\' 10"  (1.778 m)   Wt 199 lb 9.6 oz (90.5 kg)   BMI 28.64 kg/m    GEN: Well nourished, well developed, in no acute distress  HEENT: normal  Neck: no JVD, carotid bruits, or masses Cardiac: RRR; no murmurs, rubs, or gallops,no edema  Respiratory:  clear to auscultation bilaterally, normal work of breathing GI: soft, nontender, nondistended, + BS MS: no deformity or atrophy  Skin: warm and dry, no rash Neuro:  Alert and Oriented x 3, Strength and sensation are intact Psych: euthymic mood, full affect  Wt Readings from Last 3 Encounters:  10/17/17 199 lb 9.6 oz (90.5 kg)  10/02/17 207 lb 14.4 oz (94.3 kg)  06/01/17 207 lb (93.9 kg)      Studies/Labs Reviewed:   EKG:  EKG is ordered today.  The ekg ordered today demonstrates Ventricularly paced rhythm, heart rate 70  Recent Labs: 09/30/2017: ALT 22 10/02/2017: Hemoglobin 10.2; Magnesium 1.7; Platelets 149 10/17/2017: BUN 28; Creatinine, Ser 1.62; Potassium 4.9; Sodium 143   Lipid Panel    Component Value Date/Time   CHOL 112 02/27/2016 0500   TRIG 53 02/29/2016 0509   HDL 32 (L) 02/27/2016 0500   CHOLHDL 3.5 02/27/2016 0500   VLDL 14 02/27/2016 0500   LDLCALC 66 02/27/2016  0500   LDLDIRECT 137.6 01/06/2012 1029    Additional studies/ records that were reviewed  today include:   Echo 10/01/2017 LV EF: 25% -   30%  Study Conclusions  - Left ventricle: The cavity size was normal. Systolic function was   severely reduced. The estimated ejection fraction was in the   range of 25% to 30%. There is akinesis of the apical anterior,   lateral, inferolateral, inferior, and apical myocardium with no   evidence of apical thrombus by definity contrast. The study is   not technically sufficient to allow evaluation of LV diastolic   function. Doppler parameters are consistent with high ventricular   filling pressure. - Mitral valve: There was mild regurgitation. - Left atrium: The atrium was mildly dilated. - Tricuspid valve: There was mild-moderate regurgitation. - Pulmonic valve: There was trivial regurgitation. - Pulmonary arteries: PA peak pressure: 37 mm Hg (S).  Impressions:  - The right ventricular systolic pressure was increased consistent   with mild pulmonary hypertension.   ASSESSMENT:    1. CAD S/P LAD PCI- 2000   2. Presence of cardiac resynchronization therapy defibrillator (CRT-D)   3. Persistent atrial fibrillation ([place])   4. Hyperlipidemia, unspecified hyperlipidemia type   5. S/P AV nodal ablation   6. Ischemic cardiomyopathy   7. Acute on chronic renal insufficiency      PLAN:  In order of problems listed above:  1. Chronic systolic heart failure: No obvious sign of volume overload on physical exam. With his weight increased by 8 pounds since discharge, I am concerned that he might be dehydrated. I will obtain basic metabolic panel today, if creatinine continue to trend up, I likely will switch him to 20 mg Lasix every other day instead.  2. ICM s/p ICD: Managed by Dr. Rayann Heman. Recent remote transmission on 09/26/2017 suggested he required BiV pacing 97.6% of the time. This is expected after AV nodal ablation earlier this  year.  3. Acute on chronic renal insufficiency: Baseline creatinine 1.3, his creatinine trended up prior to discharge. Will obtain basic metabolic panel today. Given his weight loss after discharge, I am concerned he is being dehydrated again.  4. CAD: Denying any chest pain or shortness of breath.  5. Hyperlipidemia: On Pravachol 40 mg daily, he is due for annual repeat by primary care provider  6. Persistent atrial fibrillation: Due to difficulty with rate control, he underwent AV nodal ablation in early 2018. Has been largely dependent on BiV pacing since. Continue Coumadin    Medication Adjustments/Labs and Tests Ordered: Current medicines are reviewed at length with the patient today.  Concerns regarding medicines are outlined above.  Medication changes, Labs and Tests ordered today are listed in the Patient Instructions below. Patient Instructions  Heart failure management: 1. Avoid salt 2. Limit daily fluid intake to between 32 and 64 oz 3. Weigh yourself every morning, call cardiology if your weight change by more than 3 lbs overnight or 5 lbs in a single week.  Medication Instructions:  Your physician recommends that you continue on your current medications as directed. Please refer to the Current Medication list given to you today.  Labwork: Your physician recommends that you return for lab work in: BMP--Today  Testing/Procedures: None   Follow-Up: Your physician recommends that you schedule a follow-up appointment in: 1 month with Dr Martinique  Any Other Special Instructions Will Be Listed Below (If Applicable).  If you need a refill on your cardiac medications before your next appointment, please call your pharmacy.     Hilbert Corrigan, [place]  10/19/2017 5:58 AM  [facility] [address], [place], [place]  [address] Phone: [phone]; Fax: [phone]

## 2017-10-19 ENCOUNTER — Encounter: Payer: Self-pay | Admitting: Physician Assistant

## 2017-10-19 ENCOUNTER — Telehealth: Payer: Self-pay

## 2017-10-19 DIAGNOSIS — I5022 Chronic systolic (congestive) heart failure: Secondary | ICD-10-CM

## 2017-10-19 DIAGNOSIS — N183 Chronic kidney disease, stage 3 (moderate): Principal | ICD-10-CM

## 2017-10-19 DIAGNOSIS — N179 Acute kidney failure, unspecified: Secondary | ICD-10-CM

## 2017-10-19 MED ORDER — POTASSIUM CHLORIDE CRYS ER 20 MEQ PO TBCR: 20.0000 meq | EXTENDED_RELEASE_TABLET | ORAL | Status: DC

## 2017-10-19 MED ORDER — FUROSEMIDE 40 MG PO TABS: 20.0000 mg | ORAL_TABLET | ORAL | Status: DC

## 2017-10-19 NOTE — Telephone Encounter (Signed)
Patient directly notified, and voiced understanding. Lab order put in accordion for [facility] and medication updated on med list.

## 2017-10-19 NOTE — Progress Notes (Signed)
Kidney function worsened slightly, decrease lasix to 20mg  every other day instead, also decrease potassium to every other day as well. Recheck BMET in 1 week

## 2017-10-19 NOTE — Telephone Encounter (Signed)
-----   Message from [place], [place] sent at 10/19/2017  8:33 AM EDT ----- Kidney function worsened slightly, decrease lasix to 20mg  every other day instead, also decrease potassium to every other day as well. Recheck BMET in 1 week

## 2017-10-26 LAB — BASIC METABOLIC PANEL
BUN / CREAT RATIO: 16 (ref 10–24)
BUN: 23 mg/dL (ref 8–27)
CHLORIDE: 105 mmol/L (ref 96–106)
CO2: 26 mmol/L (ref 20–29)
Calcium: 10 mg/dL (ref 8.6–10.2)
Creatinine, Ser: 1.42 mg/dL — ABNORMAL HIGH (ref 0.76–1.27)
GFR calc non Af Amer: 50 mL/min/{1.73_m2} — ABNORMAL LOW (ref >59)
GFR, EST AFRICAN AMERICAN: 58 mL/min/{1.73_m2} — AB (ref >59)
GLUCOSE: 94 mg/dL (ref 65–99)
POTASSIUM: 4.6 mmol/L (ref 3.5–5.2)
SODIUM: 145 mmol/L — AB (ref 134–144)

## 2017-10-30 ENCOUNTER — Telehealth: Payer: Self-pay | Admitting: Cardiology

## 2017-10-30 ENCOUNTER — Ambulatory Visit (INDEPENDENT_AMBULATORY_CARE_PROVIDER_SITE_OTHER): Payer: Medicare Other

## 2017-10-30 ENCOUNTER — Telehealth: Payer: Self-pay

## 2017-10-30 DIAGNOSIS — I5022 Chronic systolic (congestive) heart failure: Secondary | ICD-10-CM | POA: Diagnosis not present

## 2017-10-30 DIAGNOSIS — Z95810 Presence of automatic (implantable) cardiac defibrillator: Secondary | ICD-10-CM | POA: Diagnosis not present

## 2017-10-30 NOTE — Telephone Encounter (Signed)
Returned call to patient lab results given. 

## 2017-10-30 NOTE — Progress Notes (Signed)
Patient returned call and he noticed yesterday his legs are slightly swollen.  He has had a 5 lb weight gain since 10/22/2017.  Weight 11/4 was 195.6 lbs compared to today, 200.8 lbs.  He confirmed he is taking Furosemide 40 mg 0.5 tablet (20 mg total) every other day (decreased 10/19/2017 by Almyra Deforest, PA).

## 2017-10-30 NOTE — Progress Notes (Signed)
EPIC Encounter for ICM Monitoring  [name] is a 71 y.o. male Date: 10/30/2017 Primary Care Physican: Christain Sacramento, MD Primary [place] Electrophysiologist: Faustino Congress Weight:unknown Bi-V Pacing: 97.6%       Attempted call to patient and unable to reach.  Left message to return call.  Transmission reviewed.    Optivol: Thoracic impedance abnormal suggesting fluid accumulation since 10/22/2017.  Also impedance decreased 09/27/2017 to 10/13/2017.  Prescribed dosage: Furosemide 40 mg 0.5 tablet (20 mg total) every other day (decreased 10/19/2017 by Almyra Deforest, PA)  Labs: 10/26/2017 Creatinine 1.42, BUN 23, Potassium 4.6, Sodium 145, EGFR 50-58 10/17/2017 Creatinine 1.62, BUN 28, Potassium 4.9, Sodium 143, EGFR 43-49  10/02/2017 Creatinine 1.42, BUN 18, Potassium 3.7, Sodium 139, EGFR 49-57  10/01/2017 Creatinine 1.28, BUN 22, Potassium 3.9, Sodium 139, EGFR 55->60  09/30/2017 Creatinine 1.81, BUN 35, Potassium 3.5, Sodium 133, EGFR 36-42  09/29/2017 Creatinine 2.71, BUN 44, Potassium 3.7, Sodium 136, EGFR 22-26  05/23/2017 Creatinine 1.33, BUN 20, Potassium 3.5, Sodium 144, EGFR 54-63        A complete set of results can be found in Results Review.  Recommendations: NONE - Unable to reach.  Follow-up plan: ICM clinic phone appointment on 11/02/2017 to recheck fluid levels. Office visit 11/17/2017 with Dr Martinique and 11/20/2017 with Dr Caryl Comes.  Copy of ICM check sent to Dr. Martinique, Hao Meng PA and Dr. Caryl Comes.   3 month ICM trend: 10/30/2017    1 Year ICM trend:       [name], RN 10/30/2017 1:46 PM

## 2017-10-30 NOTE — Telephone Encounter (Signed)
Remote ICM transmission received.  Attempted call to patient and left message per to return call.

## 2017-10-30 NOTE — Telephone Encounter (Signed)
Returning your call. °

## 2017-11-02 ENCOUNTER — Telehealth: Payer: Self-pay | Admitting: Cardiology

## 2017-11-02 ENCOUNTER — Ambulatory Visit (INDEPENDENT_AMBULATORY_CARE_PROVIDER_SITE_OTHER): Payer: Self-pay

## 2017-11-02 DIAGNOSIS — I5022 Chronic systolic (congestive) heart failure: Secondary | ICD-10-CM

## 2017-11-02 DIAGNOSIS — Z95810 Presence of automatic (implantable) cardiac defibrillator: Secondary | ICD-10-CM

## 2017-11-02 NOTE — Progress Notes (Signed)
EPIC Encounter for ICM Monitoring  [name] is a 71 y.o. male Date: 11/02/2017 Primary Care Physican: Christain Sacramento, MD Primary [place] Electrophysiologist: Faustino Congress Weight:unknown Bi-V Pacing: 95.4%             Heart Failure questions reviewed, pt asymptomatic and denied any fluid symptoms.   Thoracic impedance remains unchanged, abnormal, suggesting fluid accumulation since 10/22/2017.  Prescribed dosage: Furosemide 40 mg 0.5 tablet (20 mg total) every other day (decreased 10/19/2017 by Almyra Deforest, PA)  Labs: 10/26/2017 Creatinine 1.42, BUN 23, Potassium 4.6, Sodium 145, EGFR 50-58 10/17/2017 Creatinine 1.62, BUN 28, Potassium 4.9, Sodium 143, EGFR 43-49  10/02/2017 Creatinine 1.42, BUN 18, Potassium 3.7, Sodium 139, EGFR 49-57  10/01/2017 Creatinine 1.28, BUN 22, Potassium 3.9, Sodium 139, EGFR 55->60  09/30/2017 Creatinine 1.81, BUN 35, Potassium 3.5, Sodium 133, EGFR 36-42  09/29/2017 Creatinine 2.71, BUN 44, Potassium 3.7, Sodium 136, EGFR 22-26  05/23/2017 Creatinine 1.33, BUN 20, Potassium 3.5, Sodium 144, EGFR 54-63        A complete set of results can be found in Results Review.  Recommendations: No changes.    Encouraged to call for fluid symptoms.  Follow-up plan: ICM clinic phone appointment on 11/16/2017 which is day before office visit 11/17/2017 with Dr Martinique.  Appointment on 11/20/2017 with Dr Caryl Comes.  Copy of ICM check sent to Dr. Martinique and Dr. Caryl Comes.   3 month ICM trend: 11/02/2017    1 Year ICM trend:       [name], RN 11/02/2017 5:07 PM

## 2017-11-02 NOTE — Telephone Encounter (Signed)
Spoke with pt and reminded pt of remote transmission that is due today. Pt verbalized understanding.   

## 2017-11-06 DIAGNOSIS — F419 Anxiety disorder, unspecified: Secondary | ICD-10-CM | POA: Insufficient documentation

## 2017-11-06 DIAGNOSIS — Z950 Presence of cardiac pacemaker: Secondary | ICD-10-CM | POA: Insufficient documentation

## 2017-11-06 DIAGNOSIS — Z95810 Presence of automatic (implantable) cardiac defibrillator: Secondary | ICD-10-CM | POA: Insufficient documentation

## 2017-11-06 DIAGNOSIS — N189 Chronic kidney disease, unspecified: Secondary | ICD-10-CM | POA: Insufficient documentation

## 2017-11-06 DIAGNOSIS — I509 Heart failure, unspecified: Secondary | ICD-10-CM | POA: Insufficient documentation

## 2017-11-06 DIAGNOSIS — I739 Peripheral vascular disease, unspecified: Secondary | ICD-10-CM | POA: Insufficient documentation

## 2017-11-06 DIAGNOSIS — I2510 Atherosclerotic heart disease of native coronary artery without angina pectoris: Secondary | ICD-10-CM | POA: Insufficient documentation

## 2017-11-06 DIAGNOSIS — I4819 Other persistent atrial fibrillation: Secondary | ICD-10-CM | POA: Insufficient documentation

## 2017-11-06 DIAGNOSIS — N529 Male erectile dysfunction, unspecified: Secondary | ICD-10-CM | POA: Insufficient documentation

## 2017-11-06 DIAGNOSIS — E785 Hyperlipidemia, unspecified: Secondary | ICD-10-CM | POA: Insufficient documentation

## 2017-11-06 DIAGNOSIS — I499 Cardiac arrhythmia, unspecified: Secondary | ICD-10-CM | POA: Insufficient documentation

## 2017-11-14 NOTE — Progress Notes (Signed)
[name] Date of Birth: 1946/02/25 Medical Record #[id number]  History of Present Illness: [name] is seen today for followup of atrial fibrillation and CAD.  He has a history of remote anterior myocardial infarction with an ischemic cardiomyopathy and ejection fraction of 23%. He is status post CRT D. He had a generator replacement in September 2014. He also has a history of atrial fibrillation. He is intolerant of amiodarone. His QT interval is too long for Tikosyn. He previously had attempted to increase his carvedilol dose but he developed significant hypotension.  He was admitted from 3/2-3/15/17 with a perforated duodenal ulcer with GI bleed. He required surgery with Phillip Heal patch. He was transfused. He actually did quite well from a cardiac standpoint. Some of his cardiac meds were held due to hypotension. He also had bilateral distal DVTs. He was discharged to [place] and discharged after about 8 days.  In September 2017 he had upper and lower EGD. He had some esophagitis and some nodular irregularity of the gastric mucosa. The duodenal patch was healing. There was a duodenal polyp. On lower EGD he had hemorrhoids, diverticuli, and a 5 cm polyp. Biopsies negative for malignancy. Significant ventral hernia.   In October 2017 his Optivol indicated increased fluid retention and lasix was increased to 40 mg bid. Optivol level improved. Started on [place] but limited due to hypotension and had to be discontinued and losartan resumed. Later noted to have persistent Afib with elevated HR. Seen by Dr. Caryl Comes who noted "There are recent data demonstrating improved outcomes hospitalizations in mortality in patients who undergo AV junction ablation in conjunction with biventricular pacing." On 01/25/17 he underwent AV node ablation. When seen in follow up he had BiV pacing 99.7% of the time. Rate set at 80 bpm with plans to reduce rate to 70 bpm after one month.    He did undergo repair of a  ventral hernia by Dr. Dalbert Batman in April 1287 without complications.   He was admitted to the hospital on 09/30/2017 with acute renal insufficiency and  SIRS with sepsis with MAXIMUM Temperature of 102. He was hydrated with IV fluid. Troponin was borderline elevated, this was felt to be related to elevation of creatinine up to 2.71. He was treated with antibiotics. Culture was negative. Repeat echocardiogram obtained on 10/01/2017 continues showed EF 25-30%, mild MR, mild-to-moderate TR, akinesis of the apical anterior, lateral, inferolateral, inferior, apical myocardium with no evidence of apical thrombus by Definity contrast, PA peak pressure 37 mmHg. When seen in follow up creatinine down to 1.62. Lasix reduced to every other day and creatinine improved further to 1.42. Last Optivol check on November 29 showed fluid accumulation starting on November 4.   On follow up today he states he is not feeling well. Weight is up 14 lbs here and this correlates with his weight at home. He notes increased dyspnea especially with exertion and increased edema. Notes INR was supratherapeutic at primary care and is being adjusted.     Allergies as of 11/17/2017      Reactions   Keflex [cephalexin] Shortness Of Breath, Other (See Comments)   dizziness      Medication List        Accurate as of 11/17/17 10:54 AM. Always use your most recent med list.          acetaminophen 500 MG tablet Commonly known as:  TYLENOL Take 500 mg by mouth every 8 (eight) hours as needed for mild pain.  digoxin 0.125 MG tablet Commonly known as:  LANOXIN Take 0.0625 mg by mouth every other day.   fenofibrate 160 MG tablet TAKE 1 TABLET BY MOUTH  DAILY   ferrous sulfate 325 (65 FE) MG tablet Take 325 mg by mouth 2 (two) times daily.   folic acid 169 MCG tablet Commonly known as:  FOLVITE Take 800 mcg by mouth daily.   furosemide 40 MG tablet Commonly known as:  LASIX Take 0.5 tablets (20 mg total) by mouth every  other day.   losartan 25 MG tablet Commonly known as:  COZAAR Take 1 tablet (25 mg total) by mouth daily.   metoprolol succinate 25 MG 24 hr tablet Commonly known as:  TOPROL-XL Take 1 tablet (25 mg total) by mouth daily at 12 noon.   MULTIVITAMIN ADULTS PO Take 1 tablet by mouth daily.   nitroGLYCERIN 0.4 MG SL tablet Commonly known as:  NITROSTAT Place 1 tablet (0.4 mg total) under the tongue every 5 (five) minutes as needed for chest pain.   pantoprazole 40 MG tablet Commonly known as:  PROTONIX Take 40 mg by mouth 2 (two) times daily.   potassium chloride SA 20 MEQ tablet Commonly known as:  K-DUR,KLOR-CON Take 1 tablet (20 mEq total) by mouth every other day.   pravastatin 40 MG tablet Commonly known as:  PRAVACHOL TAKE 40 mg TABLET BY MOUTH  DAILY AT BEDTIME   sertraline 100 MG tablet Commonly known as:  ZOLOFT Take 100 mg by mouth daily.   vitamin C 250 MG tablet Commonly known as:  ASCORBIC ACID Take 250 mg by mouth 2 (two) times daily.   warfarin 5 MG tablet Commonly known as:  COUMADIN Take 0.5 tablets (2.5 mg total) by mouth daily at 6 PM.        Allergies  Allergen Reactions  . Keflex [Cephalexin] Shortness Of Breath and Other (See Comments)    dizziness    Past Medical History:  Diagnosis Date  . AICD (automatic cardioverter/defibrillator) present   . Anemia   . Anxiety   . CHF (congestive heart failure) ([place])   . Chronic renal insufficiency   . Coronary artery disease   . Depression   . Dyslipidemia   . Dysrhythmia    atrial fibrillation  . ED (erectile dysfunction)   . GERD (gastroesophageal reflux disease)   . Incisional hernia, without obstruction or gangrene 04/10/2017  . Ischemic cardiomyopathy    EF 23%  . MI (myocardial infarction) ([place]) 2000   ANTERIOR, s/p PCI  . Other primary cardiomyopathies   . Peripheral vascular disease ([place])    dvt legs 2017  . Persistent atrial fibrillation (HCC)    on coumadin  . Presence of  permanent cardiac pacemaker   . Shortness of breath dyspnea    on exertion    Past Surgical History:  Procedure Laterality Date  . AV NODE ABLATION N/A 01/25/2017   Procedure: AV Node Ablation;  Surgeon: [name], MD;  Location: [place] CV LAB;  Service: Cardiovascular;  Laterality: N/A;  . AV NODE ABLATION  01/25/2017  . BIV ICD GENERTAOR CHANGE OUT N/A 09/16/2013   Procedure: BIV ICD GENERTAOR CHANGE OUT;  Surgeon: [name], MD;  Location: [facility] CATH LAB;  Service: Cardiovascular;  Laterality: N/A;  . BREAST LUMPECTOMY Left   . CARDIAC CATHETERIZATION  02/05/2008   MODERATE TO SEVERE LEFT VENTRICULAR  DYSFUNCTION WITH EF 25-30%  . CARDIAC DEFIBRILLATOR PLACEMENT  2009   MDT BI-VENTRICULAR ICD by  Dr Caryl Comes  . COLONOSCOPY WITH PROPOFOL N/A 08/31/2016   Procedure: COLONOSCOPY WITH PROPOFOL;  Surgeon: Wilford Corner, MD;  Location: [facility] ENDOSCOPY;  Service: Endoscopy;  Laterality: N/A;  . CORONARY ANGIOPLASTY  2000   stent  . CORONARY STENT PLACEMENT     LAD  . CYSTOSCOPY N/A 01/25/2016   Procedure: CYSTOSCOPY;  Surgeon: Carolan Clines, MD;  Location: [facility];  Service: Urology;  Laterality: N/A;  . ESOPHAGOGASTRODUODENOSCOPY (EGD) WITH PROPOFOL N/A 08/31/2016   Procedure: ESOPHAGOGASTRODUODENOSCOPY (EGD) WITH PROPOFOL;  Surgeon: Wilford Corner, MD;  Location: [facility] ENDOSCOPY;  Service: Endoscopy;  Laterality: N/A;  . FRACTURE SURGERY Right 1972   leg  . IMPLANTABLE CARDIOVERTER DEFIBRILLATOR GENERATOR CHANGE  2014  . INCISIONAL HERNIA REPAIR N/A 04/10/2017   Procedure: OPEN REPAIR INCISIONAL VENTRAL HERNIA;  Surgeon: [name], MD;  Location: [place];  Service: General;  Laterality: N/A;  . INSERT / REPLACE / REMOVE PACEMAKER     ICD  . INSERTION OF MESH N/A 04/10/2017   Procedure: INSERTION OF MESH;  Surgeon: [name], MD;  Location: [place];  Service: General;  Laterality: N/A;  . LAPAROTOMY N/A 02/22/2016   Procedure: EXPLORATORY LAPAROTOMY WITH  PATCH OF DUODENAL  ULCER;  Surgeon: Rolm Bookbinder, MD;  Location: [place];  Service: General;  Laterality: N/A;  . MULTIPLE TOOTH EXTRACTIONS    . TRANSTHORACIC ECHOCARDIOGRAM  12/2010   EF 30-35%  . TRANSURETHRAL RESECTION OF PROSTATE N/A 01/25/2016   Procedure: TRANSURETHRAL RESECTION OF THE PROSTATE (TURP);  Surgeon: Carolan Clines, MD;  Location: [facility];  Service: Urology;  Laterality: N/A;  . WRIST FRACTURE SURGERY Left 1972    Social History   Tobacco Use  Smoking Status Former Smoker  . Types: Cigarettes  . Last attempt to quit: 12/23/1998  . Years since quitting: 18.9  Smokeless Tobacco Never Used    Social History   Substance and Sexual Activity  Alcohol Use No    Family History  Problem Relation Age of Onset  . Breast cancer Mother   . Depression Mother   . Heart attack Father   . Breast cancer Sister     Review of Systems: As noted in history of present illness. All other systems were reviewed and are negative.  Physical Exam: BP 130/62   Pulse 74   Ht 5\' 10"  (1.778 m)   Wt 213 lb 6.4 oz (96.8 kg)   SpO2 96%   BMI 30.62 kg/m  GENERAL:  Well appearing WM in NAD HEENT:  PERRL, EOMI, sclera are clear. Oropharynx is clear. NECK:  elevated jugular venous distention, carotid upstroke brisk and symmetric, no bruits, no thyromegaly or adenopathy LUNGS:  Clear to auscultation bilaterally CHEST:  Unremarkable HEART:  RRR,  PMI not displaced or sustained,S1 and S2 within normal limits, no S3, no S4: no clicks, no rubs, no murmurs ABD:  Soft, nontender. BS +, no masses or bruits. No hepatomegaly, no splenomegaly EXT:  2 + pulses throughout, 1-2 + edema, no cyanosis no clubbing SKIN:  Warm and dry.  No rashes NEURO:  Alert and oriented x 3. Cranial nerves II through XII intact. PSYCH:  Cognitively intact    LABORATORY DATA:  Lab Results  Component Value Date   WBC 8.5 10/02/2017   HGB 10.2 (L) 10/02/2017   HCT 32.2 (L) 10/02/2017   PLT 149 (L) 10/02/2017   GLUCOSE 94  10/26/2017   CHOL 112 02/27/2016   TRIG 53 02/29/2016   HDL 32 (L) 02/27/2016   LDLDIRECT  137.6 01/06/2012   LDLCALC 66 02/27/2016   ALT 22 09/30/2017   AST 34 09/30/2017   NA 145 (H) 10/26/2017   K 4.6 10/26/2017   CL 105 10/26/2017   CREATININE 1.42 (H) 10/26/2017   BUN 23 10/26/2017   CO2 26 10/26/2017   TSH 3.082 02/18/2016   PSA 3.19 01/19/2016   INR 2.88 10/02/2017   Echo 10/01/17: Study Conclusions  - Left ventricle: The cavity size was normal. Systolic function was   severely reduced. The estimated ejection fraction was in the   range of 25% to 30%. There is akinesis of the apical anterior,   lateral, inferolateral, inferior, and apical myocardium with no   evidence of apical thrombus by definity contrast. The study is   not technically sufficient to allow evaluation of LV diastolic   function. Doppler parameters are consistent with high ventricular   filling pressure. - Mitral valve: There was mild regurgitation. - Left atrium: The atrium was mildly dilated. - Tricuspid valve: There was mild-moderate regurgitation. - Pulmonic valve: There was trivial regurgitation. - Pulmonary arteries: PA peak pressure: 37 mm Hg (S).  Impressions:  - The right ventricular systolic pressure was increased consistent   with mild pulmonary hypertension.   Assessment / Plan: 1. Atrial fibrillation. Now s/p AV node ablation with good rate control. Will continue with metoprolol for LV dysfunction.  On coumadin and INR checked by primary care. Recent coagulopathy related to antibiotics and CHF.   2. Coronary disease with remote anterior myocardial infarction. He is asymptomatic.  3. Acute on Chronic systolic congestive heart failure. He is up 14 lbs today with recent reduction in lasix dose.  Currently on metoprolol, lasix, losartan, and digoxin.   Ejection fraction is 20-25% in January 2017. In October it was 25-30%.  Continue sodium restriction. We will increase lasix to 40 mg bid  for 3 days then 40 mg daily. Take potassium 20 meq daily. Check BMET in one week.   4. S/p perforated duodenal ulcer and bleeding. S/p surgery with duodenal patch. Now s/p ventral hernia repair.   5.  S/p CRT-D. Followed in device clinic. Seeing Dr. Caryl Comes next week.   6. Bilateral DVT- right soleal, left posterial tibial and peroneal veins. On coumadin. Resolved.   Medication reviewed and updated list as noted.   Will follow up with APP in 4 weeks.  Peter Martinique MD, [facility]  11/17/2017

## 2017-11-16 ENCOUNTER — Telehealth: Payer: Self-pay | Admitting: Cardiology

## 2017-11-16 ENCOUNTER — Ambulatory Visit (INDEPENDENT_AMBULATORY_CARE_PROVIDER_SITE_OTHER): Payer: Self-pay

## 2017-11-16 ENCOUNTER — Ambulatory Visit (INDEPENDENT_AMBULATORY_CARE_PROVIDER_SITE_OTHER): Payer: Medicare Other | Admitting: *Deleted

## 2017-11-16 DIAGNOSIS — I5022 Chronic systolic (congestive) heart failure: Secondary | ICD-10-CM

## 2017-11-16 DIAGNOSIS — Z95810 Presence of automatic (implantable) cardiac defibrillator: Secondary | ICD-10-CM

## 2017-11-16 DIAGNOSIS — I255 Ischemic cardiomyopathy: Secondary | ICD-10-CM

## 2017-11-16 NOTE — Progress Notes (Signed)
Remote ICD transmission.   

## 2017-11-16 NOTE — Progress Notes (Signed)
EPIC Encounter for ICM Monitoring  [name] is a 71 y.o. male Date: 11/16/2017 Primary Care Physican: Christain Sacramento, MD Primary [place] Electrophysiologist: Caryl Comes Dry Weight:209 lbs Bi-V Pacing: 94.8%       Heart Failure questions reviewed, pt symptomatic with leg swelling and very Cianni Manny of breath.  He stated he has not been feeling well at all.  He said his INR did not register on the monitor because it was so high at PCP office this past week and dropped to 4.2 after taking 1 vitamin K.  Since 11/10 highest weight has been 213 lbs and lowest weight 199 lbs.   Thoracic impedance abnormal suggesting fluid accumulation since 10/22/2017  Prescribed dosage: Furosemide40 mg 0.5 tablet(20 mg total)every other day (decreased 10/19/2017 by Almyra Deforest, [place])  Labs: 10/26/2017 Creatinine1.42, BUN23, Potassium4.6, [id number], [id number] 10/17/2017 Creatinine1.62, BUN28, Potassium4.9, [id number], [id number]  10/02/2017 Creatinine1.42, BUN18, Potassium3.7, [id number], [id number]  10/01/2017 Creatinine1.28, BUN22, Potassium3.9, [id number], [id number]->60  09/30/2017 Creatinine1.81, BUN35, Potassium3.5, [id number], [id number]  09/29/2017 Creatinine2.71, BUN44, Potassium3.7, [id number], [id number]  05/23/2017 Creatinine1.33, BUN20, Potassium3.5, [id number], [id number] A complete set of results can be found in Results Review.  Recommendations:  Reviewed his limitations of salt and fluid intake.  Patient has been eating out more and says he tries to follow low salt diet but he does not review any food labels. Advised he should call when he starts having fluid symptoms such as the shortness of breath and weight gain.   Any recommendations will be made at [place] with Dr Martinique 11/30.   Follow-up plan: ICM clinic phone appointment on 11/30/2017 to recheck fluid levels.  Office visit 11/17/2017 with Dr Martinique.  Appointment on 11/20/2017 with Dr  Caryl Comes.  Copy of ICM check sent to Dr. Martinique and Dr. Caryl Comes.   3 month ICM trend: 11/16/2017    1 Year ICM trend:       [name], RN 11/16/2017 11:47 AM

## 2017-11-16 NOTE — Telephone Encounter (Signed)
LMOVM reminding pt to send remote transmission.   

## 2017-11-17 ENCOUNTER — Encounter: Payer: Self-pay | Admitting: Cardiology

## 2017-11-17 ENCOUNTER — Other Ambulatory Visit: Payer: Self-pay

## 2017-11-17 ENCOUNTER — Ambulatory Visit: Payer: Medicare Other | Admitting: Cardiology

## 2017-11-17 VITALS — BP 130/62 | HR 74 | Ht 70.0 in | Wt 213.4 lb

## 2017-11-17 DIAGNOSIS — I5022 Chronic systolic (congestive) heart failure: Secondary | ICD-10-CM

## 2017-11-17 DIAGNOSIS — N179 Acute kidney failure, unspecified: Secondary | ICD-10-CM

## 2017-11-17 DIAGNOSIS — N183 Chronic kidney disease, stage 3 (moderate): Secondary | ICD-10-CM | POA: Diagnosis not present

## 2017-11-17 DIAGNOSIS — I5043 Acute on chronic combined systolic (congestive) and diastolic (congestive) heart failure: Secondary | ICD-10-CM

## 2017-11-17 DIAGNOSIS — I4819 Other persistent atrial fibrillation: Secondary | ICD-10-CM

## 2017-11-17 DIAGNOSIS — Z9889 Other specified postprocedural states: Secondary | ICD-10-CM

## 2017-11-17 DIAGNOSIS — I481 Persistent atrial fibrillation: Secondary | ICD-10-CM

## 2017-11-17 MED ORDER — POTASSIUM CHLORIDE CRYS ER 20 MEQ PO TBCR: 20.0000 meq | EXTENDED_RELEASE_TABLET | Freq: Every day | ORAL | Status: DC

## 2017-11-17 MED ORDER — FUROSEMIDE 40 MG PO TABS: 40.0000 mg | ORAL_TABLET | Freq: Every day | ORAL | 3 refills | Status: DC

## 2017-11-17 NOTE — Patient Instructions (Addendum)
Increase lasix to 40 mg twice a day for 3 days then 40 mg once a day  Increase potassium to 20 meq daily  Continue your other therapy  We will check blood work in one week

## 2017-11-17 NOTE — Addendum Note (Signed)
Addended by: Kathyrn Lass on: 11/17/2017 11:45 AM   Modules accepted: Orders

## 2017-11-20 ENCOUNTER — Encounter: Payer: Self-pay | Admitting: Internal Medicine

## 2017-11-20 ENCOUNTER — Encounter (INDEPENDENT_AMBULATORY_CARE_PROVIDER_SITE_OTHER): Payer: Self-pay

## 2017-11-20 ENCOUNTER — Ambulatory Visit: Payer: Medicare Other | Admitting: Internal Medicine

## 2017-11-20 VITALS — BP 122/62 | HR 70 | Ht 70.0 in | Wt 206.2 lb

## 2017-11-20 DIAGNOSIS — I482 Chronic atrial fibrillation, unspecified: Secondary | ICD-10-CM

## 2017-11-20 DIAGNOSIS — I255 Ischemic cardiomyopathy: Secondary | ICD-10-CM | POA: Diagnosis not present

## 2017-11-20 DIAGNOSIS — Z95810 Presence of automatic (implantable) cardiac defibrillator: Secondary | ICD-10-CM | POA: Diagnosis not present

## 2017-11-20 DIAGNOSIS — I5022 Chronic systolic (congestive) heart failure: Secondary | ICD-10-CM | POA: Diagnosis not present

## 2017-11-20 LAB — CUP PACEART REMOTE DEVICE CHECK
Battery Remaining Longevity: 27 mo
Brady Statistic AP VS Percent: 0 %
Brady Statistic AS VP Percent: 98.1 %
Date Time Interrogation Session: 20181129174850
HIGH POWER IMPEDANCE MEASURED VALUE: 46 Ohm
HighPow Impedance: 40 Ohm
Implantable Lead Implant Date: 20090226
Implantable Lead Location: 753859
Implantable Lead Location: 753860
Implantable Lead Model: 5076
Implantable Pulse Generator Implant Date: 20140929
Lead Channel Impedance Value: 285 Ohm
Lead Channel Impedance Value: 285 Ohm
Lead Channel Impedance Value: 342 Ohm
Lead Channel Impedance Value: 513 Ohm
Lead Channel Impedance Value: 570 Ohm
Lead Channel Pacing Threshold Pulse Width: 0.6 ms
Lead Channel Sensing Intrinsic Amplitude: 26.375 mV
Lead Channel Setting Pacing Pulse Width: 0.6 ms
Lead Channel Setting Pacing Pulse Width: 0.6 ms
Lead Channel Setting Sensing Sensitivity: 0.3 mV
MDC IDC LEAD IMPLANT DT: 20090226
MDC IDC LEAD IMPLANT DT: 20090226
MDC IDC LEAD LOCATION: 753858
MDC IDC MSMT BATTERY VOLTAGE: 2.93 V
MDC IDC MSMT LEADCHNL LV IMPEDANCE VALUE: 703 Ohm
MDC IDC MSMT LEADCHNL LV PACING THRESHOLD AMPLITUDE: 0.625 V
MDC IDC MSMT LEADCHNL RA SENSING INTR AMPL: 4.375 mV
MDC IDC MSMT LEADCHNL RA SENSING INTR AMPL: 4.375 mV
MDC IDC MSMT LEADCHNL RV SENSING INTR AMPL: 26.375 mV
MDC IDC SET LEADCHNL LV PACING AMPLITUDE: 1.75 V
MDC IDC SET LEADCHNL RV PACING AMPLITUDE: 2.5 V
MDC IDC STAT BRADY AP VP PERCENT: 0 %
MDC IDC STAT BRADY AS VS PERCENT: 1.9 %
MDC IDC STAT BRADY RA PERCENT PACED: 0 %
MDC IDC STAT BRADY RV PERCENT PACED: 94.84 %

## 2017-11-20 NOTE — Patient Instructions (Addendum)
Medication Instructions:  Your physician recommends that you continue on your current medications as directed. Please refer to the Current Medication list given to you today.  *If you need a refill on your cardiac medications before your next appointment, please call your pharmacy*  Labwork: None ordered  Testing/Procedures: None ordered  Follow-Up: Remote monitoring is used to monitor your Pacemaker or ICD from home. This monitoring reduces the number of office visits required to check your device to one time per year. It allows [place] to keep an eye on the functioning of your device to ensure it is working properly. You are scheduled for a device check from home on 02/15/2017. You may send your transmission at any time that day. If you have a wireless device, the transmission will be sent automatically. After your physician reviews your transmission, you will receive a postcard with your next transmission date.  Your physician wants you to follow-up in: 1 year with Dr. Caryl Comes.  You will receive a reminder letter in the mail two months in advance. If you don't receive a letter, please call our office to schedule the follow-up appointment.  Thank you for choosing [facility]!!    Any Other Special Instructions Will Be Listed Below (If Applicable).   [name]

## 2017-11-20 NOTE — Progress Notes (Signed)
Patient Care Team: Christain Sacramento, MD as PCP - General (Family Medicine)   HPI  [name] is a 71 y.o. male Seen in followup of afib for which he recently saw Dr Greggory Brandy and the was decision to not pursue P VI He also has ichemic cardiomyopathy and HFrEF with prior CRT-D from which he garnered little benefit. CXR demonstrates good lead location  We had spent many months deciding what to do about rate control and finally underwent AV junction ablation 2/18   He is modestly improved breathing   Hospitalized 3/17 for GI bleed 2/2 ulcers requring surgery for perforation  now back on warfain  Recently underwent incisional hernia repair   Which is slowly healing  DATE TEST    1/17    Echo   EF 20.25 %           Date Cr K Dig   4/18 1.43 3.4    11/18 1.42 4.6     He was hospitalized 10/18 with presumed sepsis fever and dehydration.  This was followed by volume overload.  He saw Dr. Shirlee More last week.  He was up 14 pounds diuretics were increased.  His weight is down half way back towards normal but he remains short of breath.   Past Medical History:  Diagnosis Date  . AICD (automatic cardioverter/defibrillator) present   . Anemia   . Anxiety   . CHF (congestive heart failure) ([place])   . Chronic renal insufficiency   . Coronary artery disease   . Depression   . Dyslipidemia   . Dysrhythmia    atrial fibrillation  . ED (erectile dysfunction)   . GERD (gastroesophageal reflux disease)   . Incisional hernia, without obstruction or gangrene 04/10/2017  . Ischemic cardiomyopathy    EF 23%  . MI (myocardial infarction) ([place]) 2000   ANTERIOR, s/p PCI  . Other primary cardiomyopathies   . Peripheral vascular disease ([place])    dvt legs 2017  . Persistent atrial fibrillation (HCC)    on coumadin  . Presence of permanent cardiac pacemaker   . Shortness of breath dyspnea    on exertion    Past Surgical History:  Procedure Laterality Date  . AV NODE ABLATION N/A 01/25/2017   Procedure: AV Node Ablation;  Surgeon: [name], MD;  Location: [place] CV LAB;  Service: Cardiovascular;  Laterality: N/A;  . AV NODE ABLATION  01/25/2017  . BIV ICD GENERTAOR CHANGE OUT N/A 09/16/2013   Procedure: BIV ICD GENERTAOR CHANGE OUT;  Surgeon: [name], MD;  [facility] CATH LAB;  Service: Cardiovascular;  Laterality: N/A;  . BREAST LUMPECTOMY Left   . CARDIAC CATHETERIZATION  02/05/2008   MODERATE TO SEVERE LEFT VENTRICULAR  DYSFUNCTION WITH EF 25-30%  . CARDIAC DEFIBRILLATOR PLACEMENT  2009   MDT BI-VENTRICULAR ICD by Dr Caryl Comes  . COLONOSCOPY WITH PROPOFOL N/A 08/31/2016   Procedure: COLONOSCOPY WITH PROPOFOL;  Surgeon: Wilford Corner, MD;  Location: [facility] ENDOSCOPY;  Service: Endoscopy;  Laterality: N/A;  . CORONARY ANGIOPLASTY  2000   stent  . CORONARY STENT PLACEMENT     LAD  . CYSTOSCOPY N/A 01/25/2016   Procedure: CYSTOSCOPY;  Surgeon: Carolan Clines, MD;  Location: [facility];  Service: Urology;  Laterality: N/A;  . ESOPHAGOGASTRODUODENOSCOPY (EGD) WITH PROPOFOL N/A 08/31/2016   Procedure: ESOPHAGOGASTRODUODENOSCOPY (EGD) WITH PROPOFOL;  Surgeon: Wilford Corner, MD;  Location: [facility] ENDOSCOPY;  Service: Endoscopy;  Laterality: N/A;  . FRACTURE SURGERY Right 1972  leg  . IMPLANTABLE CARDIOVERTER DEFIBRILLATOR GENERATOR CHANGE  2014  . INCISIONAL HERNIA REPAIR N/A 04/10/2017   Procedure: OPEN REPAIR INCISIONAL VENTRAL HERNIA;  Surgeon: [name], MD;  Location: [place];  Service: General;  Laterality: N/A;  . INSERT / REPLACE / REMOVE PACEMAKER     ICD  . INSERTION OF MESH N/A 04/10/2017   Procedure: INSERTION OF MESH;  Surgeon: [name], MD;  Location: [place];  Service: General;  Laterality: N/A;  . LAPAROTOMY N/A 02/22/2016   Procedure: EXPLORATORY LAPAROTOMY WITH  PATCH OF DUODENAL ULCER;  Surgeon: Rolm Bookbinder, MD;  Location: [place];  Service: General;  Laterality: N/A;  . MULTIPLE TOOTH EXTRACTIONS    . TRANSTHORACIC ECHOCARDIOGRAM  12/2010   EF  30-35%  . TRANSURETHRAL RESECTION OF PROSTATE N/A 01/25/2016   Procedure: TRANSURETHRAL RESECTION OF THE PROSTATE (TURP);  Surgeon: Carolan Clines, MD;  Location: [facility];  Service: Urology;  Laterality: N/A;  . WRIST FRACTURE SURGERY Left 1972    Current Outpatient Medications  Medication Sig Dispense Refill  . acetaminophen (TYLENOL) 500 MG tablet Take 500 mg by mouth every 8 (eight) hours as needed for mild pain.    Marland Kitchen digoxin (LANOXIN) 0.125 MG tablet Take 0.0625 mg by mouth every other day.     . fenofibrate 160 MG tablet TAKE 1 TABLET BY MOUTH  DAILY 90 tablet 3  . ferrous sulfate 325 (65 FE) MG tablet Take 325 mg by mouth 2 (two) times daily.    . folic acid (FOLVITE) 846 MCG tablet Take 800 mcg by mouth daily.     . furosemide (LASIX) 40 MG tablet Take 1 tablet (40 mg total) by mouth daily. 90 tablet 3  . losartan (COZAAR) 25 MG tablet Take 1 tablet (25 mg total) by mouth daily. 30 tablet 3  . metoprolol succinate (TOPROL-XL) 25 MG 24 hr tablet Take 1 tablet (25 mg total) by mouth daily at 12 noon. 90 tablet 0  . Multiple Vitamins-Minerals (MULTIVITAMIN ADULTS PO) Take 1 tablet by mouth daily.    . pantoprazole (PROTONIX) 40 MG tablet Take 40 mg by mouth 2 (two) times daily.     . potassium chloride SA (K-DUR,KLOR-CON) 20 MEQ tablet Take 1 tablet (20 mEq total) by mouth daily.    . pravastatin (PRAVACHOL) 40 MG tablet TAKE 40 mg TABLET BY MOUTH  DAILY AT BEDTIME    . sertraline (ZOLOFT) 100 MG tablet Take 100 mg by mouth daily.      . vitamin C (ASCORBIC ACID) 250 MG tablet Take 250 mg by mouth 2 (two) times daily.    Marland Kitchen warfarin (COUMADIN) 5 MG tablet Take 0.5 tablets (2.5 mg total) by mouth daily at 6 PM. 30 tablet 0  . nitroGLYCERIN (NITROSTAT) 0.4 MG SL tablet Place 1 tablet (0.4 mg total) under the tongue every 5 (five) minutes as needed for chest pain. 25 tablet 3   No current facility-administered medications for this visit.     Allergies  Allergen Reactions  . Keflex  [Cephalexin] Shortness Of Breath and Other (See Comments)    dizziness    Review of Systems negative except from HPI and PMH  Physical Exam BP 122/62   Pulse 70   Ht 5\' 10"  (1.778 m)   Wt 206 lb 3.2 oz (93.5 kg)   BMI 29.59 kg/m  Well developed and nourished in no acute distress HENT normal Neck supple with JVP-flat Clear Regular rate and rhythm, no murmurs or gallops Abd-soft with active  BS No Clubbing cyanosis edema Skin-warm and dry A & Oriented  Grossly normal sensory and motor function   ECG atrial fibrillation with complete heart block and ventricular pacing at 70  Assessment and  Plan  Atrial fibrillation  -permanent  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  The patient's device was interrogated.  The information was reviewed. No changes were made in the programming.     Volume status is approaching normal.  We have reviewed his thoracic impedance measurements.  We will have him increase his diuretics to twice daily on Wednesday if his weight is not continuing to improve based weight 200 pounds.  Follow-up with Dr. Claris Gower PA as scheduled at  about 2 weeks.  He will need to be made at that time.  Without symptoms of ischemia  On Anticoagulation;  No bleeding issues

## 2017-11-22 ENCOUNTER — Telehealth: Payer: Self-pay | Admitting: Cardiology

## 2017-11-22 NOTE — Telephone Encounter (Signed)
New Message     Patient and his wife would like to go over all of his medication, there is a mixup on their side on the dosage and when he is suppose to be taking his medicines.

## 2017-11-22 NOTE — Telephone Encounter (Signed)
Reviewed medication list with patient and wife.   Verbalized understanding.

## 2017-11-30 ENCOUNTER — Telehealth: Payer: Self-pay | Admitting: *Deleted

## 2017-11-30 ENCOUNTER — Ambulatory Visit (INDEPENDENT_AMBULATORY_CARE_PROVIDER_SITE_OTHER): Payer: Self-pay

## 2017-11-30 DIAGNOSIS — I5022 Chronic systolic (congestive) heart failure: Secondary | ICD-10-CM

## 2017-11-30 DIAGNOSIS — Z95810 Presence of automatic (implantable) cardiac defibrillator: Secondary | ICD-10-CM

## 2017-11-30 LAB — BASIC METABOLIC PANEL
BUN / CREAT RATIO: 21 (ref 10–24)
BUN: 29 mg/dL — AB (ref 8–27)
CALCIUM: 10 mg/dL (ref 8.6–10.2)
CHLORIDE: 100 mmol/L (ref 96–106)
CO2: 31 mmol/L — ABNORMAL HIGH (ref 20–29)
Creatinine, Ser: 1.39 mg/dL — ABNORMAL HIGH (ref 0.76–1.27)
GFR, EST AFRICAN AMERICAN: 59 mL/min/{1.73_m2} — AB (ref >59)
GFR, EST NON AFRICAN AMERICAN: 51 mL/min/{1.73_m2} — AB (ref >59)
Glucose: 87 mg/dL (ref 65–99)
Potassium: 4.6 mmol/L (ref 3.5–5.2)
Sodium: 143 mmol/L (ref 134–144)

## 2017-11-30 NOTE — Telephone Encounter (Signed)
-----   Message from Peter M Martinique, MD sent at 11/30/2017  4:57 PM EST ----- Renal function and potassium are stable.  Peter Martinique MD, [facility]

## 2017-11-30 NOTE — Telephone Encounter (Signed)
LEFT MESSAGE ABOUT LAB RESULTS ON CELL PHONE TO CALL BACK FOR RESULTS

## 2017-12-01 NOTE — Telephone Encounter (Signed)
New message ° °Pt verbalized that he is returning call for the rn  °

## 2017-12-01 NOTE — Telephone Encounter (Signed)
Returned call to patient lab results given. 

## 2017-12-05 NOTE — Progress Notes (Signed)
EPIC Encounter for ICM Monitoring  [name] is a 71 y.o. male Date: 12/05/2017 Primary Care Physican: Christain Sacramento, MD Primary [place] Electrophysiologist: Caryl Comes Dry Weight: Last OV weight 206 lbs Bi-V Pacing: 93.8%         Transmission received.   Thoracic impedance has returned to normal after increase in Furosemide at last OV with Dr Martinique on 11/17/2017.  Prescribed dosage: Furosemide40 mg 1 tablet daily (increased on 11/17/2017).  Potassium 20 mEq 1 tablet daily  Labs: 11/30/2017 Creatinine 1.39, BUN 29, Potassium 4.6, Sodium 143, EGFR 51-59 10/26/2017 Creatinine1.42, BUN23, Potassium4.6, [id number], EGFR50-58 10/17/2017 Creatinine1.62, BUN28, Potassium4.9, KIYJGZ494, IDXF58-44  10/02/2017 Creatinine1.42, BUN18, Potassium3.7, BNLWHK718, DODQ55-00  10/01/2017 Creatinine1.28, BUN22, Potassium3.9, TUYWXI379, EGFR55->60  09/30/2017 Creatinine1.81, BUN35, Potassium3.5, Sodium133, DLOP16-74  09/29/2017 Creatinine2.71, BUN44, Potassium3.7, ADLKZG948, XAFH83-07  05/23/2017 Creatinine1.33, BUN20, Potassium3.5, OGACGB847, JGYL69-43 A complete set of results can be found in Results Review.  Recommendations: None.  Follow-up plan: ICM clinic phone appointment on 01/01/2018.  Office visit with Almyra Deforest, PA on 12/08/2018  Copy of ICM check sent to Dr. Caryl Comes and Dr Martinique (to show improvement since increasing Furosemide).   3 month ICM trend: 11/30/2017    1 Year ICM trend:       [name], RN 12/05/2017 8:42 AM

## 2017-12-08 ENCOUNTER — Encounter: Payer: Self-pay | Admitting: Physician Assistant

## 2017-12-08 ENCOUNTER — Ambulatory Visit: Payer: Medicare Other | Admitting: Physician Assistant

## 2017-12-08 VITALS — BP 104/65 | HR 81 | Ht 70.0 in | Wt 199.0 lb

## 2017-12-08 DIAGNOSIS — I255 Ischemic cardiomyopathy: Secondary | ICD-10-CM | POA: Diagnosis not present

## 2017-12-08 DIAGNOSIS — I5022 Chronic systolic (congestive) heart failure: Secondary | ICD-10-CM | POA: Diagnosis not present

## 2017-12-08 DIAGNOSIS — Z95810 Presence of automatic (implantable) cardiac defibrillator: Secondary | ICD-10-CM

## 2017-12-08 DIAGNOSIS — I2510 Atherosclerotic heart disease of native coronary artery without angina pectoris: Secondary | ICD-10-CM | POA: Diagnosis not present

## 2017-12-08 DIAGNOSIS — I482 Chronic atrial fibrillation, unspecified: Secondary | ICD-10-CM

## 2017-12-08 DIAGNOSIS — E785 Hyperlipidemia, unspecified: Secondary | ICD-10-CM

## 2017-12-08 NOTE — Patient Instructions (Signed)
Medication Instructions:   No changes  If you gain more than 3 lbs in 24 hrs or 5 lbs in 1 week, take an extra lasix tablet and call our office. Your "dry weight" goal is 194-196 lbs on your home scale.  Labwork:   none  Testing/Procedures:  none  Follow-Up:  6 months with Dr. Martinique  If you need a refill on your cardiac medications before your next appointment, please call your pharmacy.

## 2017-12-08 NOTE — Progress Notes (Signed)
Cardiology Office Note    Date:  12/10/2017   ID:  [name], [name] 06-May-1946, MRN [id number]  PCP:  Christain Sacramento, MD  Cardiologist:  Dr. Martinique   Chief Complaint  Patient presents with  . Follow-up    followup CHF, seen for Dr. Martinique    History of Present Illness:  [name] is a 71 y.o. male with PMH of CAD s/p remote anterior MI, ICM s/p CRT-D, atrial fibrillation, and HLD. He had generator change placed in September 2014. He is intolerant of amiodarone. His QT interval was too long for Tikosyn. He previously attempted to increase his carvedilol dose, however developed significant hypotension. He was admitted in March 2017 for perforated duodenal ulcer with GI bleed, this was surgically repaired with Phillip Heal patch. He also required blood transfusion as well. Some of his medications were held due to hypotension. He had bilateral distal DVTs. He was noted to have some esophagitis and nodular irregularities of gastric mucosa on EGD in September 2017. Colonoscopy revealed hemorrhoids, diverticuli and a 5 cm polyp. Biopsy negative for malignancy. In October 2017, he had volume overloaded indicated by his Optival reading, Lasix was increased to 40 mg twice a day. He was started on Entresto but developed hypotension, he was later transitioned back to losartan. Due to persistent atrial fibrillation with elevated heart rate, he underwent AV nodal ablation in February 2018. He was last seen by Dr. Martinique in June 2018, at which time he was doing well.  He was admitted to the hospital on 09/30/2017 with renal insufficiency. He reported episode of weakness. He was also treated for SIRS and sepsis with MAXIMUM TEMPERATURE 102 and tachypnea. White blood cell count was 16.6. Initial lactic acid 2.14. He was hydrated with IV fluid. Troponin was borderline elevated, this was felt to be related to elevation of creatinine up to 2.71. He was treated with vancomycin, Levaquin and aztreonam. Culture  was negative. Repeat echocardiogram obtained on 10/01/2017 continues showed EF 25-30%, mild MR, mild-to-moderate TR, akinesis of the apical anterior, lateral, inferolateral, inferior, apical myocardium with no evidence of apical thrombus by Definity contrast, PA peak pressure 37 mmHg.  Due to concern of dehydration, his Lasix was reduced down to 20 mg every other day.  However he had reaccumulation of fluid by the time he was seen by Dr. Martinique on 11/17/2017.  His Lasix was restarted at 40 mg daily.  For the past week, his weight has been fairly stable.  His dry weight appears to be 194-196 pounds.  He may take additional dose of Lasix if his weight increased by more than 3 pounds overnight or 5 pounds in single week.  Otherwise he has no lower extremity edema, orthopnea or paroxysmal nocturnal dyspnea.  He denies any chest pain.  His breathing is much better.  Past Medical History:  Diagnosis Date  . AICD (automatic cardioverter/defibrillator) present   . Anemia   . Anxiety   . CHF (congestive heart failure) ([place])   . Chronic renal insufficiency   . Coronary artery disease   . Depression   . Dyslipidemia   . Dysrhythmia    atrial fibrillation  . ED (erectile dysfunction)   . GERD (gastroesophageal reflux disease)   . Incisional hernia, without obstruction or gangrene 04/10/2017  . Ischemic cardiomyopathy    EF 23%  . MI (myocardial infarction) ([place]) 2000   ANTERIOR, s/p PCI  . Other primary cardiomyopathies   . Peripheral vascular disease ([place])  dvt legs 2017  . Persistent atrial fibrillation (HCC)    on coumadin  . Presence of permanent cardiac pacemaker   . Shortness of breath dyspnea    on exertion    Past Surgical History:  Procedure Laterality Date  . AV NODE ABLATION N/A 01/25/2017   Procedure: AV Node Ablation;  Surgeon: [name], MD;  Location: [facility] CV LAB;  Service: Cardiovascular;  Laterality: N/A;  . AV NODE ABLATION  01/25/2017  . BIV ICD GENERTAOR  CHANGE OUT N/A 09/16/2013   Procedure: BIV ICD GENERTAOR CHANGE OUT;  Surgeon: [name], MD;  Location: [facility] CATH LAB;  Service: Cardiovascular;  Laterality: N/A;  . BREAST LUMPECTOMY Left   . CARDIAC CATHETERIZATION  02/05/2008   MODERATE TO SEVERE LEFT VENTRICULAR  DYSFUNCTION WITH EF 25-30%  . CARDIAC DEFIBRILLATOR PLACEMENT  2009   MDT BI-VENTRICULAR ICD by Dr Caryl Comes  . COLONOSCOPY WITH PROPOFOL N/A 08/31/2016   Procedure: COLONOSCOPY WITH PROPOFOL;  Surgeon: Wilford Corner, MD;  Location: [facility] ENDOSCOPY;  Service: Endoscopy;  Laterality: N/A;  . CORONARY ANGIOPLASTY  2000   stent  . CORONARY STENT PLACEMENT     LAD  . CYSTOSCOPY N/A 01/25/2016   Procedure: CYSTOSCOPY;  Surgeon: Carolan Clines, MD;  Location: [facility] ORS;  Service: Urology;  Laterality: N/A;  . ESOPHAGOGASTRODUODENOSCOPY (EGD) WITH PROPOFOL N/A 08/31/2016   Procedure: ESOPHAGOGASTRODUODENOSCOPY (EGD) WITH PROPOFOL;  Surgeon: Wilford Corner, MD;  Location: [facility] ENDOSCOPY;  Service: Endoscopy;  Laterality: N/A;  . FRACTURE SURGERY Right 1972   leg  . IMPLANTABLE CARDIOVERTER DEFIBRILLATOR GENERATOR CHANGE  2014  . INCISIONAL HERNIA REPAIR N/A 04/10/2017   Procedure: OPEN REPAIR INCISIONAL VENTRAL HERNIA;  Surgeon: [name], MD;  Location: [facility];  Service: General;  Laterality: N/A;  . INSERT / REPLACE / REMOVE PACEMAKER     ICD  . INSERTION OF MESH N/A 04/10/2017   Procedure: INSERTION OF MESH;  Surgeon: [name], MD;  Location: [facility];  Service: General;  Laterality: N/A;  . LAPAROTOMY N/A 02/22/2016   Procedure: EXPLORATORY LAPAROTOMY WITH  PATCH OF DUODENAL ULCER;  Surgeon: Rolm Bookbinder, MD;  Location: [facility];  Service: General;  Laterality: N/A;  . MULTIPLE TOOTH EXTRACTIONS    . TRANSTHORACIC ECHOCARDIOGRAM  12/2010   EF 30-35%  . TRANSURETHRAL RESECTION OF PROSTATE N/A 01/25/2016   Procedure: TRANSURETHRAL RESECTION OF THE PROSTATE (TURP);  Surgeon: Carolan Clines, MD;  Location: [facility] ORS;  Service:  Urology;  Laterality: N/A;  . WRIST FRACTURE SURGERY Left 1972    Current Medications: Outpatient Medications Prior to Visit  Medication Sig Dispense Refill  . acetaminophen (TYLENOL) 500 MG tablet Take 500 mg by mouth every 8 (eight) hours as needed for mild pain.    Marland Kitchen digoxin (LANOXIN) 0.125 MG tablet Take 0.0625 mg by mouth every other day.     . fenofibrate 160 MG tablet TAKE 1 TABLET BY MOUTH  DAILY 90 tablet 3  . ferrous sulfate 325 (65 FE) MG tablet Take 325 mg by mouth 2 (two) times daily.    . folic acid (FOLVITE) 950 MCG tablet Take 800 mcg by mouth daily.     . furosemide (LASIX) 40 MG tablet Take 1 tablet (40 mg total) by mouth daily. 90 tablet 3  . losartan (COZAAR) 25 MG tablet Take 1 tablet (25 mg total) by mouth daily. 30 tablet 3  . metoprolol succinate (TOPROL-XL) 25 MG 24 hr tablet Take 1 tablet (25 mg total) by mouth daily at  12 noon. 90 tablet 0  . Multiple Vitamins-Minerals (MULTIVITAMIN ADULTS PO) Take 1 tablet by mouth daily.    . nitroGLYCERIN (NITROSTAT) 0.4 MG SL tablet Place 1 tablet (0.4 mg total) under the tongue every 5 (five) minutes as needed for chest pain. 25 tablet 3  . pantoprazole (PROTONIX) 40 MG tablet Take 40 mg by mouth 2 (two) times daily.     . potassium chloride SA (K-DUR,KLOR-CON) 20 MEQ tablet Take 1 tablet (20 mEq total) by mouth daily.    . pravastatin (PRAVACHOL) 40 MG tablet TAKE 40 mg TABLET BY MOUTH  DAILY AT BEDTIME    . sertraline (ZOLOFT) 100 MG tablet Take 100 mg by mouth daily.      . vitamin C (ASCORBIC ACID) 250 MG tablet Take 250 mg by mouth 2 (two) times daily.    Marland Kitchen warfarin (COUMADIN) 5 MG tablet Take 0.5 tablets (2.5 mg total) by mouth daily at 6 PM. (Patient taking differently: Take 1 mg by mouth daily at 6 PM. ) 30 tablet 0   No facility-administered medications prior to visit.      Allergies:   Keflex [cephalexin]   Social History   Socioeconomic History  . Marital status: Married    Spouse name: None  . Number of  children: 2  . Years of education: None  . Highest education level: None  Social Needs  . Financial resource strain: None  . Food insecurity - worry: None  . Food insecurity - inability: None  . Transportation needs - medical: None  . Transportation needs - non-medical: None  Occupational History  . Occupation: disabled    Employer: RETIRED  Tobacco Use  . Smoking status: Former Smoker    Types: Cigarettes    Last attempt to quit: 12/23/1998    Years since quitting: 18.9  . Smokeless tobacco: Never Used  Substance and Sexual Activity  . Alcohol use: No  . Drug use: No  . Sexual activity: None  Other Topics Concern  . None  Social History Narrative   Lives in [place].  Retired Teacher, English as a foreign language           Family History:  The patient's family history includes Breast cancer in his mother and sister; Depression in his mother; Heart attack in his father.   ROS:   Please see the history of present illness.    ROS All other systems reviewed and are negative.   PHYSICAL EXAM:   VS:  BP 104/65   Pulse 81   Ht 5\' 10"  (1.778 m)   Wt 199 lb (90.3 kg)   BMI 28.55 kg/m    GEN: Well nourished, well developed, in no acute distress  HEENT: normal  Neck: no JVD, carotid bruits, or masses Cardiac: RRR; no murmurs, rubs, or gallops,no edema  Respiratory:  clear to auscultation bilaterally, normal work of breathing GI: soft, nontender, nondistended, + BS MS: no deformity or atrophy  Skin: warm and dry, no rash Neuro:  Alert and Oriented x 3, Strength and sensation are intact Psych: euthymic mood, full affect  Wt Readings from Last 3 Encounters:  12/08/17 199 lb (90.3 kg)  11/20/17 206 lb 3.2 oz (93.5 kg)  11/17/17 213 lb 6.4 oz (96.8 kg)      Studies/Labs Reviewed:   EKG:  EKG is not ordered today.    Recent Labs: 09/30/2017: ALT 22 10/02/2017: Hemoglobin 10.2; Magnesium 1.7; Platelets 149 11/30/2017: BUN 29; Creatinine, Ser 1.39; Potassium 4.6; Sodium 143   Lipid  Panel    Component Value Date/Time   CHOL 112 02/27/2016 0500   TRIG 53 02/29/2016 0509   HDL 32 (L) 02/27/2016 0500   CHOLHDL 3.5 02/27/2016 0500   VLDL 14 02/27/2016 0500   LDLCALC 66 02/27/2016 0500   LDLDIRECT 137.6 01/06/2012 1029    Additional studies/ records that were reviewed today include:   Echo 10/01/2017 LV EF: 25% -   30%  Study Conclusions  - Left ventricle: The cavity size was normal. Systolic function was   severely reduced. The estimated ejection fraction was in the   range of 25% to 30%. There is akinesis of the apical anterior,   lateral, inferolateral, inferior, and apical myocardium with no   evidence of apical thrombus by definity contrast. The study is   not technically sufficient to allow evaluation of LV diastolic   function. Doppler parameters are consistent with high ventricular   filling pressure. - Mitral valve: There was mild regurgitation. - Left atrium: The atrium was mildly dilated. - Tricuspid valve: There was mild-moderate regurgitation. - Pulmonic valve: There was trivial regurgitation. - Pulmonary arteries: PA peak pressure: 37 mm Hg (S).  Impressions:  - The right ventricular systolic pressure was increased consistent   with mild pulmonary hypertension.   ASSESSMENT:    1. Chronic systolic heart failure ([place])   2. Coronary artery disease involving native coronary artery of native heart without angina pectoris   3. Cardiac resynchronization therapy defibrillator (CRT-D) in place   4. Ischemic cardiomyopathy   5. Hyperlipidemia, unspecified hyperlipidemia type   6. Chronic atrial fibrillation (HCC)      PLAN:  In order of problems listed above:  1. Chronic systolic heart failure: Dry weight appears to be between 194-196 pounds.  Continue on 40 mg daily of Lasix.  Basic metabolic panel obtained after initiating higher dose of Lasix showed stable renal function and electrolyte  2. ICM s/p ICD: Managed by Dr. Rayann Heman.   Echocardiogram showed stable EF 25% in October 2018  3. CAD: Denies any chest pain or shortness of breath.  4. Hyperlipidemia: On Pravachol 40 mg daily.  5. Persistent atrial fibrillation: He had AV nodal ablation in early 2018.  Continue Coumadin    Medication Adjustments/Labs and Tests Ordered: Current medicines are reviewed at length with the patient today.  Concerns regarding medicines are outlined above.  Medication changes, Labs and Tests ordered today are listed in the Patient Instructions below. Patient Instructions  Medication Instructions:   No changes  If you gain more than 3 lbs in 24 hrs or 5 lbs in 1 week, take an extra lasix tablet and call our office. Your "dry weight" goal is 194-196 lbs on your home scale.  Labwork:   none  Testing/Procedures:  none  Follow-Up:  6 months with Dr. Martinique  If you need a refill on your cardiac medications before your next appointment, please call your pharmacy.    Hilbert Corrigan, [place]  12/10/2017 2:26 PM    [facility] [address], [place], [place]  [address] Phone: [phone]; Fax: [phone]

## 2017-12-10 ENCOUNTER — Encounter: Payer: Self-pay | Admitting: Physician Assistant

## 2017-12-28 ENCOUNTER — Ambulatory Visit (INDEPENDENT_AMBULATORY_CARE_PROVIDER_SITE_OTHER): Payer: Medicare Other | Admitting: *Deleted

## 2017-12-28 DIAGNOSIS — I255 Ischemic cardiomyopathy: Secondary | ICD-10-CM

## 2017-12-28 DIAGNOSIS — Z95810 Presence of automatic (implantable) cardiac defibrillator: Secondary | ICD-10-CM

## 2017-12-28 DIAGNOSIS — I5022 Chronic systolic (congestive) heart failure: Secondary | ICD-10-CM

## 2017-12-28 NOTE — Progress Notes (Signed)
Remote ICD transmission.   

## 2017-12-28 NOTE — Progress Notes (Signed)
EPIC Encounter for ICM Monitoring  [name] is a 72 y.o. male Date: 12/28/2017 Primary Care Physican: Christain Sacramento, MD Primary [place] Electrophysiologist: Caryl Comes Dry Weight: Last OV weight 199 lbs Bi-V Pacing: 97.9%  Clinical Status (30-Nov-2017 to 28-Dec-2017) Treated VT/VF 0 episodes  AT/AF 3 episodes  Time in AT/AF <0.1 hr/day (<0.1%)      Attempted call to patient and unable to reach.  Left detailed message regarding transmission.  Transmission reviewed.    Thoracic impedance normal.  Prescribed dosage: Furosemide40 mg 1 tablet daily and may take an extra tablet if a gain of 3 lbs in 24 hrs or 5 lbs in 1 week (per Almyra Deforest PA OV note on 12/08/17).    Potassium 20 mEq 1 tablet daily  Labs: 11/30/2017 Creatinine 1.39, BUN 29, Potassium 4.6, Sodium 143, EGFR 51-59 10/26/2017 Creatinine1.42, BUN23, Potassium4.6, [id number], EGFR50-58 10/17/2017 Creatinine1.62, BUN28, Potassium4.9, EYEMVV612, AESL75-30  10/02/2017 Creatinine1.42, BUN18, Potassium3.7, YFRTMY111, NBVA70-14  10/01/2017 Creatinine1.28, BUN22, Potassium3.9, DCVUDT143, EGFR55->60  09/30/2017 Creatinine1.81, BUN35, Potassium3.5, Sodium133, OOIL57-97  09/29/2017 Creatinine2.71, BUN44, Potassium3.7, [id number], FBPP94-32  05/23/2017 Creatinine1.33, BUN20, Potassium3.5, XMDYJW929, VFMB34-03 A complete set of results can be found in Results Review.  Recommendations: Left voice mail with ICM number and encouraged to call if experiencing any fluid symptoms.  Follow-up plan: ICM clinic phone appointment on 01/29/2018.    Copy of ICM check sent to Dr. Caryl Comes.   3 month ICM trend: 12/28/2017    1 Year ICM trend:       [name], RN 12/28/2017 4:25 PM

## 2018-01-05 ENCOUNTER — Encounter: Payer: Self-pay | Admitting: Cardiology

## 2018-01-05 LAB — CUP PACEART INCLINIC DEVICE CHECK
Battery Remaining Longevity: 26 mo
Battery Voltage: 2.92 V
Brady Statistic AS VS Percent: 1.48 %
Brady Statistic RV Percent Paced: 97.15 %
Date Time Interrogation Session: 20181203190554
HIGH POWER IMPEDANCE MEASURED VALUE: 39 Ohm
HighPow Impedance: 44 Ohm
Implantable Lead Implant Date: 20090226
Implantable Lead Location: 753858
Implantable Lead Location: 753859
Implantable Pulse Generator Implant Date: 20140929
Lead Channel Pacing Threshold Amplitude: 1 V
Lead Channel Pacing Threshold Pulse Width: 0.6 ms
Lead Channel Sensing Intrinsic Amplitude: 2.875 mV
Lead Channel Setting Pacing Amplitude: 1.75 V
Lead Channel Setting Pacing Amplitude: 2.5 V
Lead Channel Setting Pacing Pulse Width: 0.6 ms
Lead Channel Setting Pacing Pulse Width: 0.6 ms
MDC IDC LEAD IMPLANT DT: 20090226
MDC IDC LEAD IMPLANT DT: 20090226
MDC IDC LEAD LOCATION: 753860
MDC IDC MSMT LEADCHNL LV IMPEDANCE VALUE: 266 Ohm
MDC IDC MSMT LEADCHNL LV IMPEDANCE VALUE: 513 Ohm
MDC IDC MSMT LEADCHNL LV IMPEDANCE VALUE: 665 Ohm
MDC IDC MSMT LEADCHNL LV PACING THRESHOLD AMPLITUDE: 0.75 V
MDC IDC MSMT LEADCHNL LV PACING THRESHOLD PULSEWIDTH: 0.6 ms
MDC IDC MSMT LEADCHNL RA IMPEDANCE VALUE: 513 Ohm
MDC IDC MSMT LEADCHNL RA SENSING INTR AMPL: 4.375 mV
MDC IDC MSMT LEADCHNL RV IMPEDANCE VALUE: 285 Ohm
MDC IDC MSMT LEADCHNL RV IMPEDANCE VALUE: 342 Ohm
MDC IDC SET LEADCHNL RV SENSING SENSITIVITY: 0.3 mV
MDC IDC STAT BRADY AP VP PERCENT: 0 %
MDC IDC STAT BRADY AP VS PERCENT: 0 %
MDC IDC STAT BRADY AS VP PERCENT: 98.52 %
MDC IDC STAT BRADY RA PERCENT PACED: 0 %

## 2018-01-16 ENCOUNTER — Other Ambulatory Visit: Payer: Self-pay | Admitting: Cardiology

## 2018-01-29 ENCOUNTER — Ambulatory Visit (INDEPENDENT_AMBULATORY_CARE_PROVIDER_SITE_OTHER): Payer: Medicare Other

## 2018-01-29 DIAGNOSIS — Z95810 Presence of automatic (implantable) cardiac defibrillator: Secondary | ICD-10-CM | POA: Diagnosis not present

## 2018-01-29 DIAGNOSIS — I5022 Chronic systolic (congestive) heart failure: Secondary | ICD-10-CM

## 2018-01-29 LAB — CUP PACEART REMOTE DEVICE CHECK
Battery Remaining Longevity: 22 mo
Brady Statistic AP VP Percent: 0 %
Brady Statistic AP VS Percent: 0 %
Brady Statistic AS VP Percent: 98.58 %
Brady Statistic AS VS Percent: 1.42 %
Brady Statistic RA Percent Paced: 0 %
HIGH POWER IMPEDANCE MEASURED VALUE: 57 Ohm
HighPow Impedance: 47 Ohm
Implantable Lead Location: 753858
Implantable Lead Location: 753859
Implantable Lead Location: 753860
Implantable Lead Model: 5076
Lead Channel Impedance Value: 266 Ohm
Lead Channel Impedance Value: 323 Ohm
Lead Channel Impedance Value: 380 Ohm
Lead Channel Impedance Value: 513 Ohm
Lead Channel Impedance Value: 551 Ohm
Lead Channel Impedance Value: 722 Ohm
Lead Channel Pacing Threshold Amplitude: 0.625 V
Lead Channel Pacing Threshold Pulse Width: 0.6 ms
Lead Channel Sensing Intrinsic Amplitude: 16.25 mV
Lead Channel Sensing Intrinsic Amplitude: 3.75 mV
Lead Channel Setting Pacing Pulse Width: 0.6 ms
Lead Channel Setting Sensing Sensitivity: 0.3 mV
MDC IDC LEAD IMPLANT DT: 20090226
MDC IDC LEAD IMPLANT DT: 20090226
MDC IDC LEAD IMPLANT DT: 20090226
MDC IDC MSMT BATTERY VOLTAGE: 2.94 V
MDC IDC MSMT LEADCHNL RA SENSING INTR AMPL: 3.75 mV
MDC IDC MSMT LEADCHNL RV SENSING INTR AMPL: 16.25 mV
MDC IDC PG IMPLANT DT: 20140929
MDC IDC SESS DTM: 20190110072824
MDC IDC SET LEADCHNL LV PACING AMPLITUDE: 1.75 V
MDC IDC SET LEADCHNL RV PACING AMPLITUDE: 2.5 V
MDC IDC SET LEADCHNL RV PACING PULSEWIDTH: 0.6 ms
MDC IDC STAT BRADY RV PERCENT PACED: 97.94 %

## 2018-01-30 ENCOUNTER — Telehealth: Payer: Self-pay

## 2018-01-30 NOTE — Progress Notes (Signed)
EPIC Encounter for ICM Monitoring  [name] is a 72 y.o. male Date: 01/30/2018 Primary Care Physican: Christain Sacramento, MD Primary [place] Electrophysiologist: Caryl Comes Dry Weight: Last OV weight 199 lbs Bi-V Pacing: 98.7%      Attempted call to patient and unable to reach.  Left detailed message regarding transmission.  Transmission reviewed.    Thoracic impedance normal.  Prescribed dosage: Furosemide40 mg1 tablet daily and may take an extra tablet if a gain of 3 lbs in 24 hrs or 5 lbs in 1 week (per Almyra Deforest PA OV note on 12/08/17).   Potassium 20 mEq 1 tablet daily  Labs: 11/30/2017 Creatinine 1.39, BUN 29, Potassium 4.6, Sodium 143, EGFR 51-59 10/26/2017 Creatinine1.42, BUN23, Potassium4.6, [id number], EGFR50-58 10/17/2017 Creatinine1.62, BUN28, Potassium4.9, [id number], YIAX65-53  10/02/2017 Creatinine1.42, BUN18, Potassium3.7, ZSMOLM786, LJQG92-01  10/01/2017 Creatinine1.28, BUN22, Potassium3.9, EOFHQR975, EGFR55->60  09/30/2017 Creatinine1.81, BUN35, Potassium3.5, Sodium133, OITG54-98  09/29/2017 Creatinine2.71, BUN44, Potassium3.7, YMEBRA309, MMHW80-88  05/23/2017 Creatinine1.33, BUN20, Potassium3.5, PJSRPR945, OPFY92-44 A complete set of results can be found in Results Review.  Recommendations: Left voice mail with ICM number and encouraged to call if experiencing any fluid symptoms.  Follow-up plan: ICM clinic phone appointment on 03/01/2018.   Copy of ICM check sent to Dr. Caryl Comes.   3 month ICM trend: 01/29/2018    1 Year ICM trend:       [name], RN 01/30/2018 9:07 AM

## 2018-01-30 NOTE — Telephone Encounter (Signed)
Remote ICM transmission received.  Attempted call to patient and left detailed message per DPR regarding transmission and next ICM scheduled for 03/01/2018.  Advised to return call for any fluid symptoms or questions.

## 2018-03-01 ENCOUNTER — Ambulatory Visit (INDEPENDENT_AMBULATORY_CARE_PROVIDER_SITE_OTHER): Payer: Medicare Other

## 2018-03-01 DIAGNOSIS — I5022 Chronic systolic (congestive) heart failure: Secondary | ICD-10-CM

## 2018-03-01 DIAGNOSIS — Z95810 Presence of automatic (implantable) cardiac defibrillator: Secondary | ICD-10-CM | POA: Diagnosis not present

## 2018-03-02 ENCOUNTER — Telehealth: Payer: Self-pay

## 2018-03-02 NOTE — Telephone Encounter (Signed)
Remote ICM transmission received.  Attempted call to patient and left detailed message per DPR regarding transmission and next ICM scheduled for 04/02/2018.  Advised to return call for any fluid symptoms or questions.    

## 2018-03-02 NOTE — Progress Notes (Signed)
EPIC Encounter for ICM Monitoring  [name] is a 72 y.o. male Date: 03/02/2018 Primary Care Physican: Christain Sacramento, MD Primary [place] Electrophysiologist: Caryl Comes Dry Weight: unknown Bi-V Pacing: 98.7%             Attempted call to patient and unable to reach.  Left detailed message regarding transmission.  Transmission reviewed.    Thoracic impedance normal.  Prescribed dosage: Furosemide40 mg1 tablet dailyand may take an extra tablet if a gain of3 lbs in 24 hrs or 5 lbs in 1 week(per Almyra Deforest PA OV note on 12/08/17).Potassium 20 mEq 1 tablet daily  Labs: 11/30/2017 Creatinine 1.39, BUN 29, Potassium 4.6, Sodium 143, EGFR 51-59 10/26/2017 Creatinine1.42, BUN23, Potassium4.6, HHIDUP735, EGFR50-58 10/17/2017 Creatinine1.62, BUN28, Potassium4.9, DIXBOE784, XQKS08-13  10/02/2017 Creatinine1.42, BUN18, Potassium3.7, GITJLL974, XVEZ50-15  10/01/2017 Creatinine1.28, BUN22, Potassium3.9, AEWYBR493, EGFR55->60  09/30/2017 Creatinine1.81, BUN35, Potassium3.5, Sodium133, XLEZ74-71  09/29/2017 Creatinine2.71, BUN44, Potassium3.7, TNBZXY728, VTVN50-41  05/23/2017 Creatinine1.33, BUN20, Potassium3.5, JSCBIP779, ZPSU86-48 A complete set of results can be found in Results Review.  Recommendations: Left voice mail with ICM number and encouraged to call if experiencing any fluid symptoms.  Follow-up plan: ICM clinic phone appointment on 04/02/2018.    Copy of ICM check sent to Dr. Caryl Comes.   3 month ICM trend: 03/01/2018    1 Year ICM trend:       [name], RN 03/02/2018 2:32 PM

## 2018-04-02 ENCOUNTER — Ambulatory Visit (INDEPENDENT_AMBULATORY_CARE_PROVIDER_SITE_OTHER): Payer: Medicare Other | Admitting: *Deleted

## 2018-04-02 DIAGNOSIS — Z95810 Presence of automatic (implantable) cardiac defibrillator: Secondary | ICD-10-CM

## 2018-04-02 DIAGNOSIS — I255 Ischemic cardiomyopathy: Secondary | ICD-10-CM

## 2018-04-02 DIAGNOSIS — I5022 Chronic systolic (congestive) heart failure: Secondary | ICD-10-CM

## 2018-04-02 NOTE — Progress Notes (Signed)
Remote ICD transmission.   

## 2018-04-02 NOTE — Progress Notes (Signed)
EPIC Encounter for ICM Monitoring  [name] is a 72 y.o. male Date: 04/02/2018 Primary Care Physican: Christain Sacramento, MD Primary [place] Electrophysiologist: Caryl Comes Dry Weight: 211 lbs Bi-V Pacing: 98.8%        Heart Failure questions reviewed, pt asymptomatic.  Weight stable   Thoracic impedance normal.  Prescribed dosage: Furosemide40 mg1 tablet dailyand may take an extra tablet if a gain of3 lbs in 24 hrs or 5 lbs in 1 week(per Almyra Deforest PA OV note on 12/08/17).Potassium 20 mEq 1 tablet daily  Labs: 11/30/2017 Creatinine 1.39, BUN 29, Potassium 4.6, Sodium 143, EGFR 51-59 10/26/2017 Creatinine1.42, BUN23, Potassium4.6, KBOQUC751, EGFR50-58 10/17/2017 Creatinine1.62, BUN28, Potassium4.9, TWYSOR980, YHNP67-22  10/02/2017 Creatinine1.42, BUN18, Potassium3.7, PNTBHG510, BDGR24-79  10/01/2017 Creatinine1.28, BUN22, Potassium3.9, PQSOXU393, EGFR55->60  09/30/2017 Creatinine1.81, BUN35, Potassium3.5, Sodium133, VFAW90-50  09/29/2017 Creatinine2.71, BUN44, Potassium3.7, KHIPRK884, DBNR44-83  05/23/2017 Creatinine1.33, BUN20, Potassium3.5, IJFTZO895, LIYI02-66 A complete set of results can be found in Results Review.  Recommendations: No changes.  Encouraged to call for fluid symptoms.  Follow-up plan: ICM clinic phone appointment on 05/03/2018.    Copy of ICM check sent to Dr. Caryl Comes.   3 month ICM trend: 04/02/2018    1 Year ICM trend:       [name], RN 04/02/2018 2:20 PM

## 2018-04-04 ENCOUNTER — Encounter: Payer: Self-pay | Admitting: Cardiology

## 2018-04-04 LAB — CUP PACEART REMOTE DEVICE CHECK
Battery Voltage: 2.92 V
Brady Statistic AP VP Percent: 0 %
Brady Statistic AS VP Percent: 99.24 %
Brady Statistic RA Percent Paced: 0 %
Brady Statistic RV Percent Paced: 98.78 %
HighPow Impedance: 50 Ohm
HighPow Impedance: 69 Ohm
Implantable Lead Implant Date: 20090226
Implantable Lead Location: 753859
Implantable Lead Model: 4194
Implantable Lead Model: 5076
Implantable Lead Model: 6947
Lead Channel Impedance Value: 494 Ohm
Lead Channel Impedance Value: 570 Ohm
Lead Channel Impedance Value: 760 Ohm
Lead Channel Pacing Threshold Amplitude: 0.625 V
Lead Channel Sensing Intrinsic Amplitude: 16.25 mV
Lead Channel Sensing Intrinsic Amplitude: 4.375 mV
Lead Channel Setting Pacing Amplitude: 1.75 V
Lead Channel Setting Pacing Amplitude: 2.5 V
Lead Channel Setting Pacing Pulse Width: 0.6 ms
MDC IDC LEAD IMPLANT DT: 20090226
MDC IDC LEAD IMPLANT DT: 20090226
MDC IDC LEAD LOCATION: 753858
MDC IDC LEAD LOCATION: 753860
MDC IDC MSMT BATTERY REMAINING LONGEVITY: 20 mo
MDC IDC MSMT LEADCHNL LV IMPEDANCE VALUE: 323 Ohm
MDC IDC MSMT LEADCHNL LV PACING THRESHOLD PULSEWIDTH: 0.6 ms
MDC IDC MSMT LEADCHNL RA SENSING INTR AMPL: 4.375 mV
MDC IDC MSMT LEADCHNL RV IMPEDANCE VALUE: 323 Ohm
MDC IDC MSMT LEADCHNL RV IMPEDANCE VALUE: 380 Ohm
MDC IDC MSMT LEADCHNL RV SENSING INTR AMPL: 16.25 mV
MDC IDC PG IMPLANT DT: 20140929
MDC IDC SESS DTM: 20190415052204
MDC IDC SET LEADCHNL RV PACING PULSEWIDTH: 0.6 ms
MDC IDC SET LEADCHNL RV SENSING SENSITIVITY: 0.3 mV
MDC IDC STAT BRADY AP VS PERCENT: 0 %
MDC IDC STAT BRADY AS VS PERCENT: 0.76 %

## 2018-04-25 ENCOUNTER — Other Ambulatory Visit: Payer: Self-pay | Admitting: Cardiology

## 2018-04-25 DIAGNOSIS — I5022 Chronic systolic (congestive) heart failure: Secondary | ICD-10-CM

## 2018-04-25 NOTE — Telephone Encounter (Signed)
Rx(s) sent to pharmacy electronically.  

## 2018-04-30 ENCOUNTER — Other Ambulatory Visit: Payer: Self-pay | Admitting: Family Medicine

## 2018-04-30 ENCOUNTER — Ambulatory Visit
Admission: RE | Admit: 2018-04-30 | Discharge: 2018-04-30 | Disposition: A | Discharge status: Home / Self Care | Payer: Medicare Other | Source: Ambulatory Visit | Attending: Family Medicine | Admitting: Family Medicine

## 2018-04-30 DIAGNOSIS — R059 Cough, unspecified: Secondary | ICD-10-CM

## 2018-04-30 DIAGNOSIS — I5022 Chronic systolic (congestive) heart failure: Secondary | ICD-10-CM

## 2018-04-30 DIAGNOSIS — R05 Cough: Secondary | ICD-10-CM

## 2018-05-01 ENCOUNTER — Telehealth: Payer: Self-pay | Admitting: Cardiology

## 2018-05-01 NOTE — Progress Notes (Signed)
Cardiology Office Note    Date:  05/02/2018   ID:  [name], [name] 09-May-1946, MRN [id number]  PCP:  [name], MD  Cardiologist:  Dr. Reann Dobias Martinique   Chief Complaint  Patient presents with  . Follow-up  . Shortness of Breath    History of Present Illness:  [name] is a 72 y.o. male with PMH of CAD s/p remote anterior MI, ICM s/p CRT-D, atrial fibrillation, and HLD. He had generator change placed in September 2014. He is intolerant of amiodarone. His QT interval was too long for Tikosyn. He previously attempted to increase his carvedilol dose, however developed significant hypotension. He was admitted in March 2017 for perforated duodenal ulcer with GI bleed, this was surgically repaired with Phillip Heal patch. He also required blood transfusion as well. Some of his medications were held due to hypotension. He had bilateral distal DVTs. He was noted to have some esophagitis and nodular irregularities of gastric mucosa on EGD in September 2017. Colonoscopy revealed hemorrhoids, diverticuli and a 5 cm polyp. Biopsy negative for malignancy. In October 2017, he had volume overloaded indicated by his Optival reading, Lasix was increased to 40 mg twice a day. He was started on Entresto but developed hypotension, he was later transitioned back to losartan. Due to persistent atrial fibrillation with elevated heart rate, he underwent AV nodal ablation in February 2018.   He was admitted to the hospital on 09/30/2017 with renal insufficiency. He reported episode of weakness. He was also treated for SIRS and sepsis with temp 102 and tachypnea. White blood cell count was 16.6. Initial lactic acid 2.14. He was hydrated with IV fluid. Troponin was borderline elevated, this was felt to be related to elevation of creatinine up to 2.71. He was treated with vancomycin, Levaquin and aztreonam. Culture was negative. Repeat echocardiogram obtained on 10/01/2017 continues showed EF 25-30%, mild MR,  mild-to-moderate TR, akinesis of the apical anterior, lateral, inferolateral, inferior, apical myocardium with no evidence of apical thrombus by Definity contrast, PA peak pressure 37 mmHg.  After that hospitalization due to concern of dehydration, his Lasix was reduced down to 20 mg every other day.  However he had reaccumulation of fluid and his Lasix was restarted at 40 mg daily.   This past Saturday he noted increased SOB, cough, and low grade fever. He states he had missed taking his lasix for a couple of days.  Seen by primary care and labs done. Resumed lasix at 20 mg daily Monday and 40 mg yesterday and digoxin dose increased. CXR showed evidence of CHF. Also treated for bronchitis. Notes some improvement in dyspnea today. Does eat a lot of sodium including processed meat (bacon) and snack foods. Also drinks a lot of soda.  Past Medical History:  Diagnosis Date  . AICD (automatic cardioverter/defibrillator) present   . Anemia   . Anxiety   . CHF (congestive heart failure) ([place])   . Chronic renal insufficiency   . Coronary artery disease   . Depression   . Dyslipidemia   . Dysrhythmia    atrial fibrillation  . ED (erectile dysfunction)   . GERD (gastroesophageal reflux disease)   . Incisional hernia, without obstruction or gangrene 04/10/2017  . Ischemic cardiomyopathy    EF 23%  . MI (myocardial infarction) ([place]) 2000   ANTERIOR, s/p PCI  . Other primary cardiomyopathies   . Peripheral vascular disease ([place])    dvt legs 2017  . Persistent atrial fibrillation ([place])  on coumadin  . Presence of permanent cardiac pacemaker   . Shortness of breath dyspnea    on exertion    Past Surgical History:  Procedure Laterality Date  . AV NODE ABLATION N/A 01/25/2017   Procedure: AV Node Ablation;  Surgeon: [name], MD;  Location: [facility];  Service: Cardiovascular;  Laterality: N/A;  . AV NODE ABLATION  01/25/2017  . BIV ICD GENERTAOR CHANGE OUT N/A 09/16/2013    Procedure: BIV ICD GENERTAOR CHANGE OUT;  Surgeon: [name], MD;  Location: [facility] CATH LAB;  Service: Cardiovascular;  Laterality: N/A;  . BREAST LUMPECTOMY Left   . CARDIAC CATHETERIZATION  02/05/2008   MODERATE TO SEVERE LEFT VENTRICULAR  DYSFUNCTION WITH EF 25-30%  . CARDIAC DEFIBRILLATOR PLACEMENT  2009   MDT BI-VENTRICULAR ICD by Dr Caryl Comes  . COLONOSCOPY WITH PROPOFOL N/A 08/31/2016   Procedure: COLONOSCOPY WITH PROPOFOL;  Surgeon: Wilford Corner, MD;  Location: [facility] ENDOSCOPY;  Service: Endoscopy;  Laterality: N/A;  . CORONARY ANGIOPLASTY  2000   stent  . CORONARY STENT PLACEMENT     LAD  . CYSTOSCOPY N/A 01/25/2016   Procedure: CYSTOSCOPY;  Surgeon: Carolan Clines, MD;  Location: [facility];  Service: Urology;  Laterality: N/A;  . ESOPHAGOGASTRODUODENOSCOPY (EGD) WITH PROPOFOL N/A 08/31/2016   Procedure: ESOPHAGOGASTRODUODENOSCOPY (EGD) WITH PROPOFOL;  Surgeon: Wilford Corner, MD;  Location: [facility] ENDOSCOPY;  Service: Endoscopy;  Laterality: N/A;  . FRACTURE SURGERY Right 1972   leg  . IMPLANTABLE CARDIOVERTER DEFIBRILLATOR GENERATOR CHANGE  2014  . INCISIONAL HERNIA REPAIR N/A 04/10/2017   Procedure: OPEN REPAIR INCISIONAL VENTRAL HERNIA;  Surgeon: [name], MD;  Location: [facility];  Service: General;  Laterality: N/A;  . INSERT / REPLACE / REMOVE PACEMAKER     ICD  . INSERTION OF MESH N/A 04/10/2017   Procedure: INSERTION OF MESH;  Surgeon: [name], MD;  Location: [facility];  Service: General;  Laterality: N/A;  . LAPAROTOMY N/A 02/22/2016   Procedure: EXPLORATORY LAPAROTOMY WITH  PATCH OF DUODENAL ULCER;  Surgeon: Rolm Bookbinder, MD;  Location: [facility];  Service: General;  Laterality: N/A;  . MULTIPLE TOOTH EXTRACTIONS    . TRANSTHORACIC ECHOCARDIOGRAM  12/2010   EF 30-35%  . TRANSURETHRAL RESECTION OF PROSTATE N/A 01/25/2016   Procedure: TRANSURETHRAL RESECTION OF THE PROSTATE (TURP);  Surgeon: Carolan Clines, MD;  Location: [facility];  Service: Urology;  Laterality: N/A;  .  WRIST FRACTURE SURGERY Left 1972    Current Medications: Outpatient Medications Prior to Visit  Medication Sig Dispense Refill  . acetaminophen (TYLENOL) 500 MG tablet Take 500 mg by mouth every 8 (eight) hours as needed for mild pain.    Marland Kitchen digoxin (LANOXIN) 0.125 MG tablet Take 0.0625 mg by mouth daily.     Marland Kitchen doxycycline (VIBRAMYCIN) 100 MG capsule Take by mouth.    . fenofibrate 160 MG tablet TAKE 1 TABLET BY MOUTH  DAILY 90 tablet 3  . ferrous sulfate 325 (65 FE) MG tablet Take 325 mg by mouth 2 (two) times daily.    . folic acid (FOLVITE) 188 MCG tablet Take 800 mcg by mouth daily.     . furosemide (LASIX) 40 MG tablet Take 1 tablet (40 mg total) by mouth daily. 90 tablet 3  . losartan (COZAAR) 25 MG tablet Take 1 tablet (25 mg total) by mouth daily. 30 tablet 3  . metoprolol succinate (TOPROL-XL) 25 MG 24 hr tablet Take 1 tablet (25 mg total) by mouth daily at 12 noon.  90 tablet 0  . Multiple Vitamins-Minerals (MULTIVITAMIN ADULTS PO) Take 1 tablet by mouth daily.    . pantoprazole (PROTONIX) 40 MG tablet Take 40 mg by mouth 2 (two) times daily.     . potassium chloride SA (K-DUR,KLOR-CON) 20 MEQ tablet Take 1 tablet (20 mEq total) by mouth daily. Take additional tablet when taking extra lasix 135 tablet 0  . pravastatin (PRAVACHOL) 40 MG tablet TAKE 1 TABLET BY MOUTH  DAILY 90 tablet 1  . sertraline (ZOLOFT) 100 MG tablet Take 100 mg by mouth daily.      . vitamin C (ASCORBIC ACID) 250 MG tablet Take 250 mg by mouth 2 (two) times daily.    Marland Kitchen warfarin (COUMADIN) 5 MG tablet Take 0.5 tablets (2.5 mg total) by mouth daily at 6 PM. (Patient taking differently: Take 1 mg by mouth daily at 6 PM. ) 30 tablet 0  . nitroGLYCERIN (NITROSTAT) 0.4 MG SL tablet Place 1 tablet (0.4 mg total) under the tongue every 5 (five) minutes as needed for chest pain. 25 tablet 3  . potassium chloride SA (K-DUR,KLOR-CON) 20 MEQ tablet Take 1 tablet (20 mEq total) by mouth daily.     No facility-administered  medications prior to visit.      Allergies:   Keflex [cephalexin]   Social History   Socioeconomic History  . Marital status: Married    Spouse name: Not on file  . Number of children: 2  . Years of education: Not on file  . Highest education level: Not on file  Occupational History  . Occupation: disabled    Employer: RETIRED  Social Needs  . Financial resource strain: Not on file  . Food insecurity:    Worry: Not on file    Inability: Not on file  . Transportation needs:    Medical: Not on file    Non-medical: Not on file  Tobacco Use  . Smoking status: Former Smoker    Types: Cigarettes    Last attempt to quit: 12/23/1998    Years since quitting: 19.3  . Smokeless tobacco: Never Used  Substance and Sexual Activity  . Alcohol use: No  . Drug use: No  . Sexual activity: Not on file  Lifestyle  . Physical activity:    Days per week: Not on file    Minutes per session: Not on file  . Stress: Not on file  Relationships  . Social connections:    Talks on phone: Not on file    Gets together: Not on file    Attends religious service: Not on file    Active member of club or organization: Not on file    Attends meetings of clubs or organizations: Not on file    Relationship status: Not on file  Other Topics Concern  . Not on file  Social History Narrative   Lives in [place].  Retired Teacher, English as a foreign language           Family History:  The patient's family history includes Breast cancer in his mother and sister; Depression in his mother; Heart attack in his father.   ROS:   Please see the history of present illness.    ROS All other systems reviewed and are negative.   PHYSICAL EXAM:   VS:  BP (!) 116/58   Pulse 81   Ht 5\' 10"  (1.778 m)   Wt 220 lb 3.2 oz (99.9 kg)   SpO2 96%   BMI 31.60 kg/m    GENERAL:  Well  appearing WM in NAD HEENT:  PERRL, EOMI, sclera are clear. Oropharynx is clear. NECK: mildly elevated  jugular venous distention, carotid upstroke brisk  and symmetric, no bruits, no thyromegaly or adenopathy LUNGS:  Clear to auscultation bilaterally CHEST:  Unremarkable HEART:  RRR,  PMI not displaced or sustained,S1 and S2 within normal limits, no S3, no S4: no clicks, no rubs, no murmurs ABD:  Soft, nontender. BS +, no masses or bruits. No hepatomegaly, no splenomegaly EXT:  2 + pulses throughout, 1+ edema- compression hose in place, no cyanosis no clubbing SKIN:  Warm and dry.  No rashes NEURO:  Alert and oriented x 3. Cranial nerves II through XII intact. PSYCH:  Cognitively intact    Wt Readings from Last 3 Encounters:  05/02/18 220 lb 3.2 oz (99.9 kg)  12/08/17 199 lb (90.3 kg)  11/20/17 206 lb 3.2 oz (93.5 kg)      Studies/Labs Reviewed:   EKG:  EKG is not ordered today.    Recent Labs: 09/30/2017: ALT 22 10/02/2017: Hemoglobin 10.2; Magnesium 1.7; Platelets 149 11/30/2017: BUN 29; Creatinine, Ser 1.39; Potassium 4.6; Sodium 143   Lipid Panel    Component Value Date/Time   CHOL 112 02/27/2016 0500   TRIG 53 02/29/2016 0509   HDL 32 (L) 02/27/2016 0500   CHOLHDL 3.5 02/27/2016 0500   VLDL 14 02/27/2016 0500   LDLCALC 66 02/27/2016 0500   LDLDIRECT 137.6 01/06/2012 1029   Labs dated 05/01/18: BUN 41, creatinine 1.72. Other chemistries normal. Dig level 0.2. WBC 7.7. Hgb 11.8.   Additional studies/ records that were reviewed today include:   Echo 10/01/2017 LV EF: 25% -   30%  Study Conclusions  - Left ventricle: The cavity size was normal. Systolic function was   severely reduced. The estimated ejection fraction was in the   range of 25% to 30%. There is akinesis of the apical anterior,   lateral, inferolateral, inferior, and apical myocardium with no   evidence of apical thrombus by definity contrast. The study is   not technically sufficient to allow evaluation of LV diastolic   function. Doppler parameters are consistent with high ventricular   filling pressure. - Mitral valve: There was mild  regurgitation. - Left atrium: The atrium was mildly dilated. - Tricuspid valve: There was mild-moderate regurgitation. - Pulmonic valve: There was trivial regurgitation. - Pulmonary arteries: PA peak pressure: 37 mm Hg (S).  Impressions:  - The right ventricular systolic pressure was increased consistent   with mild pulmonary hypertension.   ASSESSMENT:    1. Acute on chronic systolic CHF (congestive heart failure) ([place])   2. Ischemic cardiomyopathy   3. Coronary artery disease involving native coronary artery of native heart without angina pectoris   4. Chronic atrial fibrillation (HCC)   5. Biventricular implantable cardioverter-defibrillator in situ      PLAN:  In order of problems listed above:  1. Acute on Chronic systolic heart failure: weight is up 20 lbs since December.  Some compliance issues with lasix. Also high sodium and fluid intake. I have recommended increasing lasix to 40 mg bid for three days then back to 40 mg daily. Instructed on importance of sodium and fluid restriction. Will arrange follow up in 2-3 weeks with BMET and BNP.   I would not increase his digoxin dose given his CKD. Continue 0.0625 mg every other day.  2. ICM s/p ICD:   Device check in April was good with BiV pacing.   3. CAD: Denies any  chest pain or shortness of breath.  4. Hyperlipidemia: On Pravachol 40 mg daily.  5. Persistent atrial fibrillation: He had AV nodal ablation in early 2018.  Continue Coumadin 6. CKD stage 3. Creatinine is slightly higher which may reflect his CHF. Will follow closely with adjustment in lasix dose.     Medication Adjustments/Labs and Tests Ordered: Current medicines are reviewed at length with the patient today.  Concerns regarding medicines are outlined above.  Medication changes, Labs and Tests ordered today are listed in the Patient Instructions below. Patient Instructions  Increase lasix to 40 mg twice a day for 3 days then back to 40 mg  daily  Reduce digoxin back to 0.0625 mg every other day.  You need to reduce your sodium intake to less than 1500 mg daily and fluid restriction to 1500 cc per day  We will arrange follow up in 2-3 weeks with a blood test   Low-Sodium Eating Plan Sodium, which is an element that makes up salt, helps you maintain a healthy balance of fluids in your body. Too much sodium can increase your blood pressure and cause fluid and waste to be held in your body. Your health care provider or dietitian may recommend following this plan if you have high blood pressure (hypertension), kidney disease, liver disease, or heart failure. Eating less sodium can help lower your blood pressure, reduce swelling, and protect your heart, liver, and kidneys. What are tips for following this plan? General guidelines  Most people on this plan should limit their sodium intake to 1,500-2,000 mg (milligrams) of sodium each day. Reading food labels  The Nutrition Facts label lists the amount of sodium in one serving of the food. If you eat more than one serving, you must multiply the listed amount of sodium by the number of servings.  Choose foods with less than 140 mg of sodium per serving.  Avoid foods with 300 mg of sodium or more per serving. Shopping  Look for lower-sodium products, often labeled as "low-sodium" or "no salt added."  Always check the sodium content even if foods are labeled as "unsalted" or "no salt added".  Buy fresh foods. ? Avoid canned foods and premade or frozen meals. ? Avoid canned, cured, or processed meats  Buy breads that have less than 80 mg of sodium per slice. Cooking  Eat more home-cooked food and less restaurant, buffet, and fast food.  Avoid adding salt when cooking. Use salt-free seasonings or herbs instead of table salt or sea salt. Check with your health care provider or pharmacist before using salt substitutes.  Cook with plant-based oils, such as canola, sunflower, or  olive oil. Meal planning  When eating at a restaurant, ask that your food be prepared with less salt or no salt, if possible.  Avoid foods that contain MSG (monosodium glutamate). MSG is sometimes added to [place] food, bouillon, and some canned foods. What foods are recommended? The items listed may not be a complete list. Talk with your dietitian about what dietary choices are best for you. Grains Low-sodium cereals, including oats, puffed wheat and rice, and shredded wheat. Low-sodium crackers. Unsalted rice. Unsalted pasta. Low-sodium bread. Whole-grain breads and whole-grain pasta. Vegetables Fresh or frozen vegetables. "No salt added" canned vegetables. "No salt added" tomato sauce and paste. Low-sodium or reduced-sodium tomato and vegetable juice. Fruits Fresh, frozen, or canned fruit. Fruit juice. Meats and other protein foods Fresh or frozen (no salt added) meat, poultry, seafood, and fish. Low-sodium canned tuna and  salmon. Unsalted nuts. Dried peas, beans, and lentils without added salt. Unsalted canned beans. Eggs. Unsalted nut butters. Dairy Milk. Soy milk. Cheese that is naturally low in sodium, such as ricotta cheese, fresh mozzarella, or Swiss cheese Low-sodium or reduced-sodium cheese. Cream cheese. Yogurt. Fats and oils Unsalted butter. Unsalted margarine with no trans fat. Vegetable oils such as canola or olive oils. Seasonings and other foods Fresh and dried herbs and spices. Salt-free seasonings. Low-sodium mustard and ketchup. Sodium-free salad dressing. Sodium-free light mayonnaise. Fresh or refrigerated horseradish. Lemon juice. Vinegar. Homemade, reduced-sodium, or low-sodium soups. Unsalted popcorn and pretzels. Low-salt or salt-free chips. What foods are not recommended? The items listed may not be a complete list. Talk with your dietitian about what dietary choices are best for you. Grains Instant hot cereals. Bread stuffing, pancake, and biscuit mixes. Croutons.  Seasoned rice or pasta mixes. Noodle soup cups. Boxed or frozen macaroni and cheese. Regular salted crackers. Self-rising flour. Vegetables Sauerkraut, pickled vegetables, and relishes. Olives. [place] fries. Onion rings. Regular canned vegetables (not low-sodium or reduced-sodium). Regular canned tomato sauce and paste (not low-sodium or reduced-sodium). Regular tomato and vegetable juice (not low-sodium or reduced-sodium). Frozen vegetables in sauces. Meats and other protein foods Meat or fish that is salted, canned, smoked, spiced, or pickled. Bacon, ham, sausage, hotdogs, corned beef, chipped beef, packaged lunch meats, salt pork, jerky, pickled herring, anchovies, regular canned tuna, sardines, salted nuts. Dairy Processed cheese and cheese spreads. Cheese curds. Blue cheese. Feta cheese. String cheese. Regular cottage cheese. Buttermilk. Canned milk. Fats and oils Salted butter. Regular margarine. Ghee. Bacon fat. Seasonings and other foods Onion salt, garlic salt, seasoned salt, table salt, and sea salt. Canned and packaged gravies. Worcestershire sauce. Tartar sauce. Barbecue sauce. Teriyaki sauce. Soy sauce, including reduced-sodium. Steak sauce. Fish sauce. Oyster sauce. Cocktail sauce. Horseradish that you find on the shelf. Regular ketchup and mustard. Meat flavorings and tenderizers. Bouillon cubes. Hot sauce and Tabasco sauce. Premade or packaged marinades. Premade or packaged taco seasonings. Relishes. Regular salad dressings. Salsa. Potato and tortilla chips. Corn chips and puffs. Salted popcorn and pretzels. Canned or dried soups. Pizza. Frozen entrees and pot pies. Summary  Eating less sodium can help lower your blood pressure, reduce swelling, and protect your heart, liver, and kidneys.  Most people on this plan should limit their sodium intake to 1,500-2,000 mg (milligrams) of sodium each day.  Canned, boxed, and frozen foods are high in sodium. Restaurant foods, fast foods, and  pizza are also very high in sodium. You also get sodium by adding salt to food.  Try to cook at home, eat more fresh fruits and vegetables, and eat less fast food, canned, processed, or prepared foods. This information is not intended to replace advice given to you by your health care provider. Make sure you discuss any questions you have with your health care provider. Document Released: 05/27/2002 Document Revised: 11/28/2016 Document Reviewed: 11/28/2016 Elsevier Interactive Patient Education  2018 [place], Yvanna Vidas Martinique, MD  05/02/2018 9:48 AM    [facility] [address], [place], [place]  [address] Phone: [phone]; Fax: [phone]

## 2018-05-01 NOTE — Telephone Encounter (Signed)
New Message   Pt c/o Shortness Of Breath: STAT if SOB developed within the last 24 hours or pt is noticeably SOB on the phone  1. Are you currently SOB (can you hear that pt is SOB on the phone)?yes, did not hear sob  2. How long have you been experiencing SOB? Since Saturday  3. Are you SOB when sitting or when up moving around? Does not matter  4. Are you currently experiencing any other symptoms? No  Patient states that he was sent to [facility] and they notice that his kidney and liver functions is not good. His PCP advised him to make an appointment with Dr. Martinique. An appointment was made for 05/18/2018. He also has a nagging cough.

## 2018-05-01 NOTE — Telephone Encounter (Signed)
OK we will see tomorrow.  Ara Mano Martinique MD, [facility]

## 2018-05-01 NOTE — Telephone Encounter (Signed)
Returned call to patient of Dr. Martinique who complains of shortness of breath since Saturday. He denies swelling and uses compression socks. He does not weigh daily. He saw PCP yesterday and had labs done and his PCP recommended a follow up d/t this being a "heart failure episode". He reports that he was told his liver & kidney numbers were good. The labs are not available in care everywhere or [facility] at the time of documentation. He reports he had gotten away from his regimen of taking lasix as prescribed. He was by PCP advised to resume lasix at a half dose - now taking 20mg  and PCP also suggested that he increase digoxin to QD.   Scheduled patient for MD OV on 5/15 @ 0920  Routed to MD/LPN for review and any advice prior to appointment

## 2018-05-02 ENCOUNTER — Ambulatory Visit: Payer: Medicare Other | Admitting: Cardiology

## 2018-05-02 ENCOUNTER — Encounter: Payer: Self-pay | Admitting: Cardiology

## 2018-05-02 VITALS — BP 116/58 | HR 81 | Ht 70.0 in | Wt 220.2 lb

## 2018-05-02 DIAGNOSIS — I5023 Acute on chronic systolic (congestive) heart failure: Secondary | ICD-10-CM

## 2018-05-02 DIAGNOSIS — I482 Chronic atrial fibrillation, unspecified: Secondary | ICD-10-CM

## 2018-05-02 DIAGNOSIS — Z95810 Presence of automatic (implantable) cardiac defibrillator: Secondary | ICD-10-CM

## 2018-05-02 DIAGNOSIS — I255 Ischemic cardiomyopathy: Secondary | ICD-10-CM | POA: Diagnosis not present

## 2018-05-02 DIAGNOSIS — I2510 Atherosclerotic heart disease of native coronary artery without angina pectoris: Secondary | ICD-10-CM | POA: Diagnosis not present

## 2018-05-02 NOTE — Patient Instructions (Signed)
Increase lasix to 40 mg twice a day for 3 days then back to 40 mg daily  Reduce digoxin back to 0.0625 mg every other day.  You need to reduce your sodium intake to less than 1500 mg daily and fluid restriction to 1500 cc per day  We will arrange follow up in 2-3 weeks with a blood test   Low-Sodium Eating Plan Sodium, which is an element that makes up salt, helps you maintain a healthy balance of fluids in your body. Too much sodium can increase your blood pressure and cause fluid and waste to be held in your body. Your health care provider or dietitian may recommend following this plan if you have high blood pressure (hypertension), kidney disease, liver disease, or heart failure. Eating less sodium can help lower your blood pressure, reduce swelling, and protect your heart, liver, and kidneys. What are tips for following this plan? General guidelines  Most people on this plan should limit their sodium intake to 1,500-2,000 mg (milligrams) of sodium each day. Reading food labels  The Nutrition Facts label lists the amount of sodium in one serving of the food. If you eat more than one serving, you must multiply the listed amount of sodium by the number of servings.  Choose foods with less than 140 mg of sodium per serving.  Avoid foods with 300 mg of sodium or more per serving. Shopping  Look for lower-sodium products, often labeled as "low-sodium" or "no salt added."  Always check the sodium content even if foods are labeled as "unsalted" or "no salt added".  Buy fresh foods. ? Avoid canned foods and premade or frozen meals. ? Avoid canned, cured, or processed meats  Buy breads that have less than 80 mg of sodium per slice. Cooking  Eat more home-cooked food and less restaurant, buffet, and fast food.  Avoid adding salt when cooking. Use salt-free seasonings or herbs instead of table salt or sea salt. Check with your health care provider or pharmacist before using salt  substitutes.  Cook with plant-based oils, such as canola, sunflower, or olive oil. Meal planning  When eating at a restaurant, ask that your food be prepared with less salt or no salt, if possible.  Avoid foods that contain MSG (monosodium glutamate). MSG is sometimes added to [place] food, bouillon, and some canned foods. What foods are recommended? The items listed may not be a complete list. Talk with your dietitian about what dietary choices are best for you. Grains Low-sodium cereals, including oats, puffed wheat and rice, and shredded wheat. Low-sodium crackers. Unsalted rice. Unsalted pasta. Low-sodium bread. Whole-grain breads and whole-grain pasta. Vegetables Fresh or frozen vegetables. "No salt added" canned vegetables. "No salt added" tomato sauce and paste. Low-sodium or reduced-sodium tomato and vegetable juice. Fruits Fresh, frozen, or canned fruit. Fruit juice. Meats and other protein foods Fresh or frozen (no salt added) meat, poultry, seafood, and fish. Low-sodium canned tuna and salmon. Unsalted nuts. Dried peas, beans, and lentils without added salt. Unsalted canned beans. Eggs. Unsalted nut butters. Dairy Milk. Soy milk. Cheese that is naturally low in sodium, such as ricotta cheese, fresh mozzarella, or Swiss cheese Low-sodium or reduced-sodium cheese. Cream cheese. Yogurt. Fats and oils Unsalted butter. Unsalted margarine with no trans fat. Vegetable oils such as canola or olive oils. Seasonings and other foods Fresh and dried herbs and spices. Salt-free seasonings. Low-sodium mustard and ketchup. Sodium-free salad dressing. Sodium-free light mayonnaise. Fresh or refrigerated horseradish. Lemon juice. Vinegar. Homemade, reduced-sodium, or  low-sodium soups. Unsalted popcorn and pretzels. Low-salt or salt-free chips. What foods are not recommended? The items listed may not be a complete list. Talk with your dietitian about what dietary choices are best for  you. Grains Instant hot cereals. Bread stuffing, pancake, and biscuit mixes. Croutons. Seasoned rice or pasta mixes. Noodle soup cups. Boxed or frozen macaroni and cheese. Regular salted crackers. Self-rising flour. Vegetables Sauerkraut, pickled vegetables, and relishes. Olives. [place] fries. Onion rings. Regular canned vegetables (not low-sodium or reduced-sodium). Regular canned tomato sauce and paste (not low-sodium or reduced-sodium). Regular tomato and vegetable juice (not low-sodium or reduced-sodium). Frozen vegetables in sauces. Meats and other protein foods Meat or fish that is salted, canned, smoked, spiced, or pickled. Bacon, ham, sausage, hotdogs, corned beef, chipped beef, packaged lunch meats, salt pork, jerky, pickled herring, anchovies, regular canned tuna, sardines, salted nuts. Dairy Processed cheese and cheese spreads. Cheese curds. Blue cheese. Feta cheese. String cheese. Regular cottage cheese. Buttermilk. Canned milk. Fats and oils Salted butter. Regular margarine. Ghee. Bacon fat. Seasonings and other foods Onion salt, garlic salt, seasoned salt, table salt, and sea salt. Canned and packaged gravies. Worcestershire sauce. Tartar sauce. Barbecue sauce. Teriyaki sauce. Soy sauce, including reduced-sodium. Steak sauce. Fish sauce. Oyster sauce. Cocktail sauce. Horseradish that you find on the shelf. Regular ketchup and mustard. Meat flavorings and tenderizers. Bouillon cubes. Hot sauce and Tabasco sauce. Premade or packaged marinades. Premade or packaged taco seasonings. Relishes. Regular salad dressings. Salsa. Potato and tortilla chips. Corn chips and puffs. Salted popcorn and pretzels. Canned or dried soups. Pizza. Frozen entrees and pot pies. Summary  Eating less sodium can help lower your blood pressure, reduce swelling, and protect your heart, liver, and kidneys.  Most people on this plan should limit their sodium intake to 1,500-2,000 mg (milligrams) of sodium each  day.  Canned, boxed, and frozen foods are high in sodium. Restaurant foods, fast foods, and pizza are also very high in sodium. You also get sodium by adding salt to food.  Try to cook at home, eat more fresh fruits and vegetables, and eat less fast food, canned, processed, or prepared foods. This information is not intended to replace advice given to you by your health care provider. Make sure you discuss any questions you have with your health care provider. Document Released: 05/27/2002 Document Revised: 11/28/2016 Document Reviewed: 11/28/2016 Elsevier Interactive Patient Education  [employer].

## 2018-05-03 ENCOUNTER — Ambulatory Visit (INDEPENDENT_AMBULATORY_CARE_PROVIDER_SITE_OTHER): Payer: Medicare Other

## 2018-05-03 DIAGNOSIS — Z95810 Presence of automatic (implantable) cardiac defibrillator: Secondary | ICD-10-CM

## 2018-05-03 DIAGNOSIS — I5022 Chronic systolic (congestive) heart failure: Secondary | ICD-10-CM | POA: Diagnosis not present

## 2018-05-03 NOTE — Progress Notes (Signed)
EPIC Encounter for ICM Monitoring  [name] is a 72 y.o. male Date: 05/03/2018 Primary Care Physican: [name], MD Primary [place] Electrophysiologist: Caryl Comes Dry Weight: Previous weight 211 lbs Bi-V Pacing: 97.6%  Observations ?2 V. Sensing Episodes  ?1 VT-NS  ?61 seconds in AT/AF Since Last Session         Attempted call to patient and unable to reach.  Left detailed message, per DPR, regarding transmission.  Transmission reviewed.        Patient reported at office visit on 5/15 with Dr Martinique that he ws having increased SOB and cough.   Thoracic impedance abnormal suggesting fluid accumulation since 04/26/2018 but is starting to improve since Dr Martinique increased Lasix x 3 days.  Not at baseline yet.   Prescribed dosage: Furosemide40 mg1 tablet daily.Potassium 20 mEq 1 tablet daily and Take additional tablet when taking extra lasix  Labs: 11/30/2017 Creatinine 1.39, BUN 29, Potassium 4.6, Sodium 143, EGFR 51-59 10/26/2017 Creatinine1.42, BUN23, Potassium4.6, YOMAYO459, EGFR50-58 10/17/2017 Creatinine1.62, BUN28, Potassium4.9, XHFSFS239, RVUY23-34  10/02/2017 Creatinine1.42, BUN18, Potassium3.7, DHWYSH683, FGBM21-11  10/01/2017 Creatinine1.28, BUN22, Potassium3.9, BZMCEY223, EGFR55->60  09/30/2017 Creatinine1.81, BUN35, Potassium3.5, Sodium133, VKPQ24-49  09/29/2017 Creatinine2.71, BUN44, Potassium3.7, PNPYYF110, YTRZ73-56  05/23/2017 Creatinine1.33, BUN20, Potassium3.5, POLIDC301, THYH88-87 A complete set of results can be found in Results Review.  Recommendations: Dr Martinique has addressed patients symptoms at office visit 5/15. Ordered patient to increase Lasix to 40 mg bid x 3 days and then return to 40 mg daily.  He also reinforced sodium and fluid restriction.   Follow-up plan: ICM clinic phone appointment on 05/15/2018.  Office appointment scheduled 05/17/2018 with Almyra Deforest, PA and  05/18/2018 with  Dr. Martinique.  Copy of ICM check sent to Dr. Martinique and Dr. Caryl Comes.   3 month ICM trend: 05/03/2018    1 Year ICM trend:       [name], RN 05/03/2018 12:58 PM

## 2018-05-12 NOTE — Progress Notes (Deleted)
Cardiology Office Note    Date:  05/12/2018   ID:  Markese, [place] [birth year], MRN [id number]  PCP:  [name], MD  Cardiologist:  Dr. Peter Martinique   No chief complaint on file.   History of Present Illness:  [name] is a 72 y.o. male is seen for follow up CHF. He has a  PMH of CAD s/p remote anterior MI, ICM s/p CRT-D, atrial fibrillation, and HLD. He had generator change placed in September 2014. He is intolerant of amiodarone. His QT interval was too long for Tikosyn. He previously attempted to increase his carvedilol dose, however developed significant hypotension. He was admitted in March 2017 for perforated duodenal ulcer with GI bleed, this was surgically repaired with Phillip Heal patch. He also required blood transfusion as well. Some of his medications were held due to hypotension. He had bilateral distal DVTs. He was noted to have some esophagitis and nodular irregularities of gastric mucosa on EGD in September 2017. Colonoscopy revealed hemorrhoids, diverticuli and a 5 cm polyp. Biopsy negative for malignancy. In October 2017, he had volume overloaded indicated by his Optival reading, Lasix was increased to 40 mg twice a day. He was started on Entresto but developed hypotension, he was later transitioned back to losartan. Due to persistent atrial fibrillation with elevated heart rate, he underwent AV nodal ablation in February 2018.   He was admitted to the hospital on 09/30/2017 with renal insufficiency. He reported episode of weakness. He was also treated for SIRS and sepsis with temp 102 and tachypnea. White blood cell count was 16.6. Initial lactic acid 2.14. He was hydrated with IV fluid. Troponin was borderline elevated, this was felt to be related to elevation of creatinine up to 2.71. He was treated with vancomycin, Levaquin and aztreonam. Culture was negative. Repeat echocardiogram obtained on 10/01/2017 continues showed EF 25-30%, mild MR, mild-to-moderate TR,  akinesis of the apical anterior, lateral, inferolateral, inferior, apical myocardium with no evidence of apical thrombus by Definity contrast, PA peak pressure 37 mmHg.  After that hospitalization due to concern of dehydration, his Lasix was reduced down to 20 mg every other day.  However he had reaccumulation of fluid and his Lasix was restarted at 40 mg daily.   This past Saturday he noted increased SOB, cough, and low grade fever. He states he had missed taking his lasix for a couple of days.  Seen by primary care and labs done. Resumed lasix at 20 mg daily Monday and 40 mg yesterday and digoxin dose increased. CXR showed evidence of CHF. Also treated for bronchitis. Notes some improvement in dyspnea today. Does eat a lot of sodium including processed meat (bacon) and snack foods. Also drinks a lot of soda.  Past Medical History:  Diagnosis Date  . AICD (automatic cardioverter/defibrillator) present   . Anemia   . Anxiety   . CHF (congestive heart failure) ([place])   . Chronic renal insufficiency   . Coronary artery disease   . Depression   . Dyslipidemia   . Dysrhythmia    atrial fibrillation  . ED (erectile dysfunction)   . GERD (gastroesophageal reflux disease)   . Incisional hernia, without obstruction or gangrene 04/10/2017  . Ischemic cardiomyopathy    EF 23%  . MI (myocardial infarction) ([place]) 2000   ANTERIOR, s/p PCI  . Other primary cardiomyopathies   . Peripheral vascular disease ([place])    dvt legs 2017  . Persistent atrial fibrillation ([facility])    on  coumadin  . Presence of permanent cardiac pacemaker   . Shortness of breath dyspnea    on exertion    Past Surgical History:  Procedure Laterality Date  . AV NODE ABLATION N/A 01/25/2017   Procedure: AV Node Ablation;  Surgeon: [name], MD;  Location: [place];  Service: Cardiovascular;  Laterality: N/A;  . AV NODE ABLATION  01/25/2017  . BIV ICD GENERTAOR CHANGE OUT N/A 09/16/2013   Procedure: BIV ICD  GENERTAOR CHANGE OUT;  Surgeon: [name], MD;  Location: [facility] CATH LAB;  Service: Cardiovascular;  Laterality: N/A;  . BREAST LUMPECTOMY Left   . CARDIAC CATHETERIZATION  02/05/2008   MODERATE TO SEVERE LEFT VENTRICULAR  DYSFUNCTION WITH EF 25-30%  . CARDIAC DEFIBRILLATOR PLACEMENT  2009   MDT BI-VENTRICULAR ICD by Dr Caryl Comes  . COLONOSCOPY WITH PROPOFOL N/A 08/31/2016   Procedure: COLONOSCOPY WITH PROPOFOL;  Surgeon: Wilford Corner, MD;  Location: [facility] ENDOSCOPY;  Service: Endoscopy;  Laterality: N/A;  . CORONARY ANGIOPLASTY  2000   stent  . CORONARY STENT PLACEMENT     LAD  . CYSTOSCOPY N/A 01/25/2016   Procedure: CYSTOSCOPY;  Surgeon: Carolan Clines, MD;  Location: [facility] OR[facility];  Service: Urology;  Laterality: N/A;  . ESOPHAGOGASTRODUODENOSCOPY (EGD) WITH PROPOFOL N/A 08/31/2016   Procedure: ESOPHAGOGASTRODUODENOSCOPY (EGD) WITH PROPOFOL;  Surgeon: Wilford Corner, MD;  Location: [facility] ENDOSCOPY;  Service: Endoscopy;  Laterality: N/A;  . FRACTURE SURGERY Right 1972   leg  . IMPLANTABLE CARDIOVERTER DEFIBRILLATOR GENERATOR CHANGE  2014  . INCISIONAL HERNIA REPAIR N/A 04/10/2017   Procedure: OPEN REPAIR INCISIONAL VENTRAL HERNIA;  Surgeon: [name], MD;  Location: [place];  Service: General;  Laterality: N/A;  . INSERT / REPLACE / REMOVE PACEMAKER     ICD  . INSERTION OF MESH N/A 04/10/2017   Procedure: INSERTION OF MESH;  Surgeon: [name], MD;  Location: [place];  Service: General;  Laterality: N/A;  . LAPAROTOMY N/A 02/22/2016   Procedure: EXPLORATORY LAPAROTOMY WITH  PATCH OF DUODENAL ULCER;  Surgeon: Rolm Bookbinder, MD;  Location: [place];  Service: General;  Laterality: N/A;  . MULTIPLE TOOTH EXTRACTIONS    . TRANSTHORACIC ECHOCARDIOGRAM  12/2010   EF 30-35%  . TRANSURETHRAL RESECTION OF PROSTATE N/A 01/25/2016   Procedure: TRANSURETHRAL RESECTION OF THE PROSTATE (TURP);  Surgeon: Carolan Clines, MD;  Location: [facility];  Service: Urology;  Laterality: N/A;  . WRIST FRACTURE  SURGERY Left 1972    Current Medications: Outpatient Medications Prior to Visit  Medication Sig Dispense Refill  . acetaminophen (TYLENOL) 500 MG tablet Take 500 mg by mouth every 8 (eight) hours as needed for mild pain.    Marland Kitchen digoxin (LANOXIN) 0.125 MG tablet Take 0.0625 mg by mouth daily.     . fenofibrate 160 MG tablet TAKE 1 TABLET BY MOUTH  DAILY 90 tablet 3  . ferrous sulfate 325 (65 FE) MG tablet Take 325 mg by mouth 2 (two) times daily.    . folic acid (FOLVITE) 353 MCG tablet Take 800 mcg by mouth daily.     . furosemide (LASIX) 40 MG tablet Take 1 tablet (40 mg total) by mouth daily. 90 tablet 3  . losartan (COZAAR) 25 MG tablet Take 1 tablet (25 mg total) by mouth daily. 30 tablet 3  . metoprolol succinate (TOPROL-XL) 25 MG 24 hr tablet Take 1 tablet (25 mg total) by mouth daily at 12 noon. 90 tablet 0  . Multiple Vitamins-Minerals (MULTIVITAMIN ADULTS PO) Take 1 tablet  by mouth daily.    . nitroGLYCERIN (NITROSTAT) 0.4 MG SL tablet Place 1 tablet (0.4 mg total) under the tongue every 5 (five) minutes as needed for chest pain. 25 tablet 3  . pantoprazole (PROTONIX) 40 MG tablet Take 40 mg by mouth 2 (two) times daily.     . potassium chloride SA (K-DUR,KLOR-CON) 20 MEQ tablet Take 1 tablet (20 mEq total) by mouth daily. Take additional tablet when taking extra lasix 135 tablet 0  . pravastatin (PRAVACHOL) 40 MG tablet TAKE 1 TABLET BY MOUTH  DAILY 90 tablet 1  . sertraline (ZOLOFT) 100 MG tablet Take 100 mg by mouth daily.      . vitamin C (ASCORBIC ACID) 250 MG tablet Take 250 mg by mouth 2 (two) times daily.    Marland Kitchen warfarin (COUMADIN) 5 MG tablet Take 0.5 tablets (2.5 mg total) by mouth daily at 6 PM. (Patient taking differently: Take 1 mg by mouth daily at 6 PM. ) 30 tablet 0   No facility-administered medications prior to visit.      Allergies:   Keflex [cephalexin]   Social History   Socioeconomic History  . Marital status: Married    Spouse name: Not on file  . Number  of children: 2  . Years of education: Not on file  . Highest education level: Not on file  Occupational History  . Occupation: disabled    Employer: RETIRED  Social Needs  . Financial resource strain: Not on file  . Food insecurity:    Worry: Not on file    Inability: Not on file  . Transportation needs:    Medical: Not on file    Non-medical: Not on file  Tobacco Use  . Smoking status: Former Smoker    Types: Cigarettes    Last attempt to quit: 12/23/1998    Years since quitting: 19.3  . Smokeless tobacco: Never Used  Substance and Sexual Activity  . Alcohol use: No  . Drug use: No  . Sexual activity: Not on file  Lifestyle  . Physical activity:    Days per week: Not on file    Minutes per session: Not on file  . Stress: Not on file  Relationships  . Social connections:    Talks on phone: Not on file    Gets together: Not on file    Attends religious service: Not on file    Active member of club or organization: Not on file    Attends meetings of clubs or organizations: Not on file    Relationship status: Not on file  Other Topics Concern  . Not on file  Social History Narrative   Lives in [place].  Retired Teacher, English as a foreign language           Family History:  The patient's family history includes Breast cancer in his mother and sister; Depression in his mother; Heart attack in his father.   ROS:   Please see the history of present illness.    ROS All other systems reviewed and are negative.   PHYSICAL EXAM:   VS:  There were no vitals taken for this visit.   GENERAL:  Well appearing WM in NAD HEENT:  PERRL, EOMI, sclera are clear. Oropharynx is clear. NECK: mildly elevated  jugular venous distention, carotid upstroke brisk and symmetric, no bruits, no thyromegaly or adenopathy LUNGS:  Clear to auscultation bilaterally CHEST:  Unremarkable HEART:  RRR,  PMI not displaced or sustained,S1 and S2 within normal limits, no S3, no  S4: no clicks, no rubs, no murmurs ABD:   Soft, nontender. BS +, no masses or bruits. No hepatomegaly, no splenomegaly EXT:  2 + pulses throughout, 1+ edema- compression hose in place, no cyanosis no clubbing SKIN:  Warm and dry.  No rashes NEURO:  Alert and oriented x 3. Cranial nerves II through XII intact. PSYCH:  Cognitively intact    Wt Readings from Last 3 Encounters:  05/02/18 220 lb 3.2 oz (99.9 kg)  12/08/17 199 lb (90.3 kg)  11/20/17 206 lb 3.2 oz (93.5 kg)      Studies/Labs Reviewed:   EKG:  EKG is not ordered today.    Recent Labs: 09/30/2017: ALT 22 10/02/2017: Hemoglobin 10.2; Magnesium 1.7; Platelets 149 11/30/2017: BUN 29; Creatinine, Ser 1.39; Potassium 4.6; Sodium 143   Lipid Panel    Component Value Date/Time   CHOL 112 02/27/2016 0500   TRIG 53 02/29/2016 0509   HDL 32 (L) 02/27/2016 0500   CHOLHDL 3.5 02/27/2016 0500   VLDL 14 02/27/2016 0500   LDLCALC 66 02/27/2016 0500   LDLDIRECT 137.6 01/06/2012 1029   Labs dated 05/01/18: BUN 41, creatinine 1.72. Other chemistries normal. Dig level 0.2. WBC 7.7. Hgb 11.8.   Additional studies/ records that were reviewed today include:   Echo 10/01/2017 LV EF: 25% -   30%  Study Conclusions  - Left ventricle: The cavity size was normal. Systolic function was   severely reduced. The estimated ejection fraction was in the   range of 25% to 30%. There is akinesis of the apical anterior,   lateral, inferolateral, inferior, and apical myocardium with no   evidence of apical thrombus by definity contrast. The study is   not technically sufficient to allow evaluation of LV diastolic   function. Doppler parameters are consistent with high ventricular   filling pressure. - Mitral valve: There was mild regurgitation. - Left atrium: The atrium was mildly dilated. - Tricuspid valve: There was mild-moderate regurgitation. - Pulmonic valve: There was trivial regurgitation. - Pulmonary arteries: PA peak pressure: 37 mm Hg (S).  Impressions:  - The  right ventricular systolic pressure was increased consistent   with mild pulmonary hypertension.   ASSESSMENT:    No diagnosis found.   PLAN:  In order of problems listed above:  1. Acute on Chronic systolic heart failure: weight is up 20 lbs since December.  Some compliance issues with lasix. Also high sodium and fluid intake. I have recommended increasing lasix to 40 mg bid for three days then back to 40 mg daily. Instructed on importance of sodium and fluid restriction. Will arrange follow up in 2-3 weeks with BMET and BNP.   I would not increase his digoxin dose given his CKD. Continue 0.0625 mg every other day.  2. ICM s/p ICD:   Device check in April was good with BiV pacing.   3. CAD: Denies any chest pain or shortness of breath.  4. Hyperlipidemia: On Pravachol 40 mg daily.  5. Persistent atrial fibrillation: He had AV nodal ablation in early 2018.  Continue Coumadin  6. CKD stage 3. Creatinine is slightly higher which may reflect his CHF. Will follow closely with adjustment in lasix dose.     Medication Adjustments/Labs and Tests Ordered: Current medicines are reviewed at length with the patient today.  Concerns regarding medicines are outlined above.  Medication changes, Labs and Tests ordered today are listed in the Patient Instructions below. There are no Patient Instructions on file for this visit.  Signed, Peter Martinique, MD  05/12/2018 3:40 PM    [facility] [address], [place], [place]  [address] Phone: [phone]; Fax: [phone]

## 2018-05-15 ENCOUNTER — Telehealth: Payer: Self-pay | Admitting: Cardiology

## 2018-05-15 NOTE — Telephone Encounter (Signed)
LMOVM reminding pt to send remote transmission.   

## 2018-05-17 ENCOUNTER — Ambulatory Visit: Payer: Medicare Other | Admitting: Physician Assistant

## 2018-05-17 ENCOUNTER — Encounter: Payer: Self-pay | Admitting: *Deleted

## 2018-05-17 ENCOUNTER — Encounter: Payer: Self-pay | Admitting: Physician Assistant

## 2018-05-17 VITALS — BP 126/60 | HR 87 | Ht 70.0 in | Wt 210.0 lb

## 2018-05-17 DIAGNOSIS — I480 Paroxysmal atrial fibrillation: Secondary | ICD-10-CM | POA: Diagnosis not present

## 2018-05-17 DIAGNOSIS — I2510 Atherosclerotic heart disease of native coronary artery without angina pectoris: Secondary | ICD-10-CM | POA: Diagnosis not present

## 2018-05-17 DIAGNOSIS — E785 Hyperlipidemia, unspecified: Secondary | ICD-10-CM

## 2018-05-17 DIAGNOSIS — Z95810 Presence of automatic (implantable) cardiac defibrillator: Secondary | ICD-10-CM | POA: Diagnosis not present

## 2018-05-17 DIAGNOSIS — I5023 Acute on chronic systolic (congestive) heart failure: Secondary | ICD-10-CM | POA: Diagnosis not present

## 2018-05-17 DIAGNOSIS — I10 Essential (primary) hypertension: Secondary | ICD-10-CM

## 2018-05-17 NOTE — Patient Instructions (Addendum)
Medication Instructions: Your physician recommends that you continue on your current medications as directed. Please refer to the Current Medication list given to you today.  If you need a refill on your cardiac medications before your next appointment, please call your pharmacy.   Follow-Up: Your physician wants you to keep your appointment with Dr. Martinique on 06/28/18. You will receive a reminder letter in the mail two months in advance. If you don't receive a letter, please call our office at [phone] to schedule this follow-up appointment.  Thank you for choosing [facility] at [facility]!!

## 2018-05-17 NOTE — Progress Notes (Signed)
Cardiology Office Note    Date:  05/18/2018   ID:  [name], [name] Feb 18, 1946, MRN [id number]  PCP:  [name], MD  Cardiologist:  Dr. Martinique   Chief Complaint  Patient presents with  . Follow-up    seen for Dr. Martinique    History of Present Illness:  [name] is a 72 y.o. male  with PMH of CAD s/p remote anterior MI, ICM s/p CRT-D, atrial fibrillation, and HLD.He had generator change placed in September 2014. He is intolerant of amiodarone. His QT interval was too long forTikosyn.He previously attempted to increase his carvedilol dose, however developed significant hypotension. He was admitted in March 2017 for perforated duodenal ulcer with GI bleed, this was surgically repairedwith Phillip Heal patch.He also required blood transfusion as well. Someofhis medicationswere held due to hypotension. He had bilateral distal DVTs. He was noted to have some esophagitis and nodular irregularitiesof gastric mucosa on EGD in September 2017. Colonoscopy revealed hemorrhoids, diverticuli and a 5 cm polyp. Biopsy negative for malignancy. In October 2017, he had volume overloaded indicated byhis Optivalreading, Lasix was increased to 40 mg twice a day. He was started onEntrestobut developed hypotension, he was later transitioned back to losartan. Due to persistent atrial fibrillation with elevated heart rate, he underwent AV nodal ablation in February 2018. He was last seen by Dr. Martinique in June 2018, at which time he was doing well.  He was admitted to the hospital on 09/30/2017 with renal insufficiency. He reported episode of weakness. He was also treated forSIRS and sepsiswith MAXIMUM TEMPERATURE102 and tachypnea. White blood cell count was 16.6. Initial lactic acid 2.14. He was hydrated with IV fluid.Troponin was borderline elevated, this was felt to be related to elevation of creatinine up to 2.71. He was treated with vancomycin, Levaquin and aztreonam. Culture was negative.  Repeat echocardiogram obtained on 10/01/2017 continues showed EF 25-30%, mild MR, mild-to-moderate TR, akinesis of the apical anterior, lateral, inferolateral, inferior, apical myocardium with no evidence of apical thrombus by Definity contrast,PA peak pressure 37 mmHg.  Due to concern of dehydration, his Lasix was reduced to 20 mg every other day.  However by the time he saw Dr. Martinique in November 2018, he was volume overloaded.  His Lasix was restarted at 40 mg daily.  I last saw the patient on 12/08/2017, he was stable at the time with a dry weight of 194-196 at home.  Unfortunately, by the time he returned to see Dr. Martinique on 05/02/2018, his weight was 220 after missing Lasix for couple days.  He was seen by his primary care provider, he was restarted on Lasix 20 mg followed by 40 mg daily of Lasix.  Digoxin was also increased.  Chest x-ray showed evidence of heart failure.  He was also treated for bronchitis as well.  Per Dr. Doug Sou note, he was eating a lot of processed meat including bacon and snack foods.  He was also drinking a lot of soda as well.  Dr. Martinique started him on 40 mg twice daily of Lasix for 3 days before going back to 40 mg daily of Lasix thereafter.  Patient presents today for cardiology office visit along with his wife.  He continued to have dietary indiscretion at this point.  He and his wife just recently returned from a trip.  He says he had to hold his Lasix during the trip as it would cause excessive urination.  He has restarted 40 mg Lasix daily since yesterday.  His weight is still 10 pounds over his previous dry weight from 2018.  However his lung is clear.  Last interrogation from 2 weeks ago also indicated volume overload.  I asked him to continue on the 40 mg daily of Lasix for now.  He has a visit with Dr. Martinique in 6 weeks for reassessment.  Note, patient says he has been taking digoxin every other day instead.   Past Medical History:  Diagnosis Date  . AICD  (automatic cardioverter/defibrillator) present   . Anemia   . Anxiety   . CHF (congestive heart failure) ([place])   . Chronic renal insufficiency   . Coronary artery disease   . Depression   . Dyslipidemia   . Dysrhythmia    atrial fibrillation  . ED (erectile dysfunction)   . GERD (gastroesophageal reflux disease)   . Incisional hernia, without obstruction or gangrene 04/10/2017  . Ischemic cardiomyopathy    EF 23%  . MI (myocardial infarction) ([place]) 2000   ANTERIOR, s/p PCI  . Other primary cardiomyopathies   . Peripheral vascular disease ([place])    dvt legs 2017  . Persistent atrial fibrillation ([facility])    on coumadin  . Presence of permanent cardiac pacemaker   . Shortness of breath dyspnea    on exertion    Past Surgical History:  Procedure Laterality Date  . AV NODE ABLATION N/A 01/25/2017   Procedure: AV Node Ablation;  Surgeon: [name], MD;  Location: [facility];  Service: Cardiovascular;  Laterality: N/A;  . AV NODE ABLATION  01/25/2017  . BIV ICD GENERTAOR CHANGE OUT N/A 09/16/2013   Procedure: BIV ICD GENERTAOR CHANGE OUT;  Surgeon: [name], MD;  Location: [facility] CATH LAB;  Service: Cardiovascular;  Laterality: N/A;  . BREAST LUMPECTOMY Left   . CARDIAC CATHETERIZATION  02/05/2008   MODERATE TO SEVERE LEFT VENTRICULAR  DYSFUNCTION WITH EF 25-30%  . CARDIAC DEFIBRILLATOR PLACEMENT  2009   MDT BI-VENTRICULAR ICD by Dr Caryl Comes  . COLONOSCOPY WITH PROPOFOL N/A 08/31/2016   Procedure: COLONOSCOPY WITH PROPOFOL;  Surgeon: Wilford Corner, MD;  [facility] ENDOSCOPY;  Service: Endoscopy;  Laterality: N/A;  . CORONARY ANGIOPLASTY  2000   stent  . CORONARY STENT PLACEMENT     LAD  . CYSTOSCOPY N/A 01/25/2016   Procedure: CYSTOSCOPY;  Surgeon: Carolan Clines, MD;  Location: [facility];  Service: Urology;  Laterality: N/A;  . ESOPHAGOGASTRODUODENOSCOPY (EGD) WITH PROPOFOL N/A 08/31/2016   Procedure: ESOPHAGOGASTRODUODENOSCOPY (EGD) WITH PROPOFOL;  Surgeon: Wilford Corner, MD;  Location: [facility] ENDOSCOPY;  Service: Endoscopy;  Laterality: N/A;  . FRACTURE SURGERY Right 1972   leg  . IMPLANTABLE CARDIOVERTER DEFIBRILLATOR GENERATOR CHANGE  2014  . INCISIONAL HERNIA REPAIR N/A 04/10/2017   Procedure: OPEN REPAIR INCISIONAL VENTRAL HERNIA;  Surgeon: [name], MD;  Location: [facility];  Service: General;  Laterality: N/A;  . INSERT / REPLACE / REMOVE PACEMAKER     ICD  . INSERTION OF MESH N/A 04/10/2017   Procedure: INSERTION OF MESH;  Surgeon: [name], MD;  Location: [facility];  Service: General;  Laterality: N/A;  . LAPAROTOMY N/A 02/22/2016   Procedure: EXPLORATORY LAPAROTOMY WITH  PATCH OF DUODENAL ULCER;  Surgeon: Rolm Bookbinder, MD;  Location: [facility];  Service: General;  Laterality: N/A;  . MULTIPLE TOOTH EXTRACTIONS    . TRANSTHORACIC ECHOCARDIOGRAM  12/2010   EF 30-35%  . TRANSURETHRAL RESECTION OF PROSTATE N/A 01/25/2016   Procedure: TRANSURETHRAL RESECTION OF THE PROSTATE (TURP);  Surgeon: Carolan Clines, MD;  Location: [facility];  Service: Urology;  Laterality: N/A;  . WRIST FRACTURE SURGERY Left 1972    Current Medications: Outpatient Medications Prior to Visit  Medication Sig Dispense Refill  . acetaminophen (TYLENOL) 500 MG tablet Take 500 mg by mouth every 8 (eight) hours as needed for mild pain.    Marland Kitchen digoxin (LANOXIN) 0.125 MG tablet Take 0.0625 mg by mouth daily.     . fenofibrate 160 MG tablet TAKE 1 TABLET BY MOUTH  DAILY 90 tablet 3  . ferrous sulfate 325 (65 FE) MG tablet Take 325 mg by mouth 2 (two) times daily.    . folic acid (FOLVITE) 643 MCG tablet Take 800 mcg by mouth daily.     . furosemide (LASIX) 40 MG tablet Take 1 tablet (40 mg total) by mouth daily. 90 tablet 3  . losartan (COZAAR) 25 MG tablet Take 1 tablet (25 mg total) by mouth daily. 30 tablet 3  . metoprolol succinate (TOPROL-XL) 25 MG 24 hr tablet Take 1 tablet (25 mg total) by mouth daily at 12 noon. 90 tablet 0  . Multiple Vitamins-Minerals (MULTIVITAMIN  ADULTS PO) Take 1 tablet by mouth daily.    . pantoprazole (PROTONIX) 40 MG tablet Take 40 mg by mouth 2 (two) times daily.     . potassium chloride SA (K-DUR,KLOR-CON) 20 MEQ tablet Take 1 tablet (20 mEq total) by mouth daily. Take additional tablet when taking extra lasix 135 tablet 0  . pravastatin (PRAVACHOL) 40 MG tablet TAKE 1 TABLET BY MOUTH  DAILY 90 tablet 1  . sertraline (ZOLOFT) 100 MG tablet Take 100 mg by mouth daily.      . vitamin C (ASCORBIC ACID) 250 MG tablet Take 250 mg by mouth 2 (two) times daily.    Marland Kitchen warfarin (COUMADIN) 5 MG tablet Take 0.5 tablets (2.5 mg total) by mouth daily at 6 PM. (Patient taking differently: Take 1 mg by mouth daily at 6 PM. ) 30 tablet 0  . nitroGLYCERIN (NITROSTAT) 0.4 MG SL tablet Place 1 tablet (0.4 mg total) under the tongue every 5 (five) minutes as needed for chest pain. 25 tablet 3   No facility-administered medications prior to visit.      Allergies:   Keflex [cephalexin]   Social History   Socioeconomic History  . Marital status: Married    Spouse name: Not on file  . Number of children: 2  . Years of education: Not on file  . Highest education level: Not on file  Occupational History  . Occupation: disabled    Employer: RETIRED  Social Needs  . Financial resource strain: Not on file  . Food insecurity:    Worry: Not on file    Inability: Not on file  . Transportation needs:    Medical: Not on file    Non-medical: Not on file  Tobacco Use  . Smoking status: Former Smoker    Types: Cigarettes    Last attempt to quit: 12/23/1998    Years since quitting: 19.4  . Smokeless tobacco: Never Used  Substance and Sexual Activity  . Alcohol use: No  . Drug use: No  . Sexual activity: Not on file  Lifestyle  . Physical activity:    Days per week: Not on file    Minutes per session: Not on file  . Stress: Not on file  Relationships  . Social connections:    Talks on phone: Not on file    Gets together: Not  on file     Attends religious service: Not on file    Active member of club or organization: Not on file    Attends meetings of clubs or organizations: Not on file    Relationship status: Not on file  Other Topics Concern  . Not on file  Social History Narrative   Lives in [place].  Retired Teacher, English as a foreign language           Family History:  The patient's family history includes Breast cancer in his mother and sister; Depression in his mother; Heart attack in his father.   ROS:   Please see the history of present illness.    ROS All other systems reviewed and are negative.   PHYSICAL EXAM:   VS:  BP 126/60   Pulse 87   Ht 5\' 10"  (1.778 m)   Wt 210 lb (95.3 kg)   SpO2 96%   BMI 30.13 kg/m    GEN: Well nourished, well developed, in no acute distress  HEENT: normal  Neck: no JVD, carotid bruits, or masses Cardiac: RRR; no murmurs, rubs, or gallops,no edema  Respiratory:  clear to auscultation bilaterally, normal work of breathing GI: soft, nontender, nondistended, + BS MS: no deformity or atrophy  Skin: warm and dry, no rash Neuro:  Alert and Oriented x 3, Strength and sensation are intact Psych: euthymic mood, full affect  Wt Readings from Last 3 Encounters:  05/17/18 210 lb (95.3 kg)  05/02/18 220 lb 3.2 oz (99.9 kg)  12/08/17 199 lb (90.3 kg)      Studies/Labs Reviewed:   EKG:  EKG is not ordered today.    Recent Labs: 09/30/2017: ALT 22 10/02/2017: Hemoglobin 10.2; Magnesium 1.7; Platelets 149 05/17/2018: BNP WILL FOLLOW; BUN 31; Creatinine, Ser 1.65; Potassium 4.4; Sodium 146   Lipid Panel    Component Value Date/Time   CHOL 112 02/27/2016 0500   TRIG 53 02/29/2016 0509   HDL 32 (L) 02/27/2016 0500   CHOLHDL 3.5 02/27/2016 0500   VLDL 14 02/27/2016 0500   LDLCALC 66 02/27/2016 0500   LDLDIRECT 137.6 01/06/2012 1029    Additional studies/ records that were reviewed today include:   Echo  10/01/2017 ------------------------------------------------------------------- LV EF: 25% -   30%  ------------------------------------------------------------------- History:   PMH:  Cardiomyopathy. Elevated Troponin.  Dyspnea. Atrial fibrillation.  Coronary artery disease.  Congestive heart failure.  PMH:   Myocardial infarction.  Risk factors: Dyslipidemia.  ------------------------------------------------------------------- Study Conclusions  - Left ventricle: The cavity size was normal. Systolic function was   severely reduced. The estimated ejection fraction was in the   range of 25% to 30%. There is akinesis of the apical anterior,   lateral, inferolateral, inferior, and apical myocardium with no   evidence of apical thrombus by definity contrast. The study is   not technically sufficient to allow evaluation of LV diastolic   function. Doppler parameters are consistent with high ventricular   filling pressure. - Mitral valve: There was mild regurgitation. - Left atrium: The atrium was mildly dilated. - Tricuspid valve: There was mild-moderate regurgitation. - Pulmonic valve: There was trivial regurgitation. - Pulmonary arteries: PA peak pressure: 37 mm Hg (S).  Impressions:  - The right ventricular systolic pressure was increased consistent   with mild pulmonary hypertension.    ASSESSMENT:    1. Acute on chronic systolic heart failure ([place])   2. Essential hypertension   3. Coronary artery disease involving native coronary artery of native heart without angina pectoris  4. Presence of cardiac resynchronization therapy defibrillator (CRT-D)   5. PAF (paroxysmal atrial fibrillation) ([place])   6. Hyperlipidemia, unspecified hyperlipidemia type      PLAN:  In order of problems listed above:  1. Acute on chronic systolic heart failure: Continue to have dietary indiscretion, I discussed with the patient and his wife again the need to restrict salt intake.  He did  skip another few days of diuretic as he went on a trip and decided not to take Lasix during the trip.  He has restarted Lasix yesterday.  His weight is 10 pounds lower than when he saw Dr. Martinique, however compared to his baseline dry weight, his weight is still 10 pounds over.  He will continue on the 40 mg daily of Lasix.  I am hesitant to be too aggressive with him as he had a prior history of dehydration  2. CAD: Denies any angina symptoms recently.  Not on aspirin given the need for Coumadin, continue Pravachol  3. Hypertension: Blood pressure fairly controlled  4. Ischemic cardiomyopathy s/p CRT-D: Managed by electrophysiology  5. PAF: On digoxin however it appears he is only taking digoxin every other day.  Continue Coumadin  6. Hyperlipidemia: On Pravachol 40 mg daily    Medication Adjustments/Labs and Tests Ordered: Current medicines are reviewed at length with the patient today.  Concerns regarding medicines are outlined above.  Medication changes, Labs and Tests ordered today are listed in the Patient Instructions below. Patient Instructions  Medication Instructions: Your physician recommends that you continue on your current medications as directed. Please refer to the Current Medication list given to you today.  If you need a refill on your cardiac medications before your next appointment, please call your pharmacy.   Follow-Up: Your physician wants you to keep your appointment with Dr. Martinique on 06/28/18. You will receive a reminder letter in the mail two months in advance. If you don't receive a letter, please call our office at [phone] to schedule this follow-up appointment.  Thank you for choosing [facility], [place], [place]  05/18/2018 9:59 AM    [address], [place], [place]  [address] Phone: [phone]; Fax: [phone]

## 2018-05-18 ENCOUNTER — Ambulatory Visit: Payer: Medicare Other | Admitting: Cardiology

## 2018-05-18 ENCOUNTER — Encounter: Payer: Self-pay | Admitting: Physician Assistant

## 2018-05-18 LAB — BASIC METABOLIC PANEL
BUN / CREAT RATIO: 19 (ref 10–24)
BUN: 31 mg/dL — ABNORMAL HIGH (ref 8–27)
CO2: 26 mmol/L (ref 20–29)
CREATININE: 1.65 mg/dL — AB (ref 0.76–1.27)
Calcium: 9.9 mg/dL (ref 8.6–10.2)
Chloride: 102 mmol/L (ref 96–106)
GFR, EST AFRICAN AMERICAN: 48 mL/min/{1.73_m2} — AB (ref >59)
GFR, EST NON AFRICAN AMERICAN: 41 mL/min/{1.73_m2} — AB (ref >59)
Glucose: 92 mg/dL (ref 65–99)
POTASSIUM: 4.4 mmol/L (ref 3.5–5.2)
SODIUM: 146 mmol/L — AB (ref 134–144)

## 2018-05-18 LAB — BRAIN NATRIURETIC PEPTIDE: BNP: 268.9 pg/mL — ABNORMAL HIGH (ref 0.0–100.0)

## 2018-05-21 ENCOUNTER — Ambulatory Visit (INDEPENDENT_AMBULATORY_CARE_PROVIDER_SITE_OTHER): Payer: Self-pay

## 2018-05-21 DIAGNOSIS — I5022 Chronic systolic (congestive) heart failure: Secondary | ICD-10-CM

## 2018-05-21 DIAGNOSIS — Z95810 Presence of automatic (implantable) cardiac defibrillator: Secondary | ICD-10-CM

## 2018-05-21 NOTE — Progress Notes (Signed)
EPIC Encounter for ICM Monitoring  [name] is a 72 y.o. male Date: 05/21/2018 Primary Care Physican: [name], MD Primary [place] Electrophysiologist: Faustino Congress Weight: 210 lbs (at 05/17/2018 office visit) Bi-V Pacing: 98.8%      Attempted call to patient and unable to reach.  Left detailed message, per DPR, regarding transmission.  Transmission reviewed.    Thoracic impedance normal.  Prescribed dosage: Furosemide40 mg1 tablet daily.Potassium 20 mEq 1 tablet daily and Take additional tablet when taking extra lasix  Labs: 05/17/2018 Creatinine 1.65, BUN 31, Potassium 4.4, Sodium 146, EGFR 41-48, BNP 268.9 11/30/2017 Creatinine 1.39, BUN 29, Potassium 4.6, Sodium 143, EGFR 51-59 10/26/2017 Creatinine1.42, BUN23, Potassium4.6, [id number], EGFR50-58 10/17/2017 Creatinine1.62, BUN28, Potassium4.9, [id number], [id number]  10/02/2017 Creatinine1.42, BUN18, Potassium3.7, [id number], [id number]  10/01/2017 Creatinine1.28, BUN22, Potassium3.9, [id number], EGFR55->60  09/30/2017 Creatinine1.81, BUN35, Potassium3.5, [id number], IXVE55-01  09/29/2017 Creatinine2.71, BUN44, Potassium3.7, [id number], TXLE17-47  05/23/2017 Creatinine1.33, BUN20, Potassium3.5, [id number], WVTV15-04 A complete set of results can be found in Results Review.  Recommendations: Left voice mail with ICM number and encouraged to call if experiencing any fluid symptoms.  Follow-up plan: ICM clinic phone appointment on 06/04/2018.    Copy of ICM check sent to Dr. Caryl Comes.   3 month ICM trend: 05/17/2018    1 Year ICM trend:       [name], RN 05/21/2018 1:32 PM

## 2018-05-22 ENCOUNTER — Telehealth: Payer: Self-pay

## 2018-05-22 NOTE — Telephone Encounter (Signed)
Remote ICM transmission received.  Attempted call to patient and left detailed message, per DPR, regarding transmission and next ICM scheduled for 06/04/2018.  Advised to return call for any fluid symptoms or questions.

## 2018-05-24 ENCOUNTER — Telehealth: Payer: Self-pay | Admitting: Cardiology

## 2018-05-24 MED ORDER — LOSARTAN POTASSIUM 25 MG PO TABS: 25.0000 mg | ORAL_TABLET | Freq: Every day | ORAL | 1 refills | Status: DC

## 2018-05-24 NOTE — Telephone Encounter (Signed)
New Message:          *STAT* If patient is at the pharmacy, call can be transferred to refill team.   1. Which medications need to be refilled? (please list name of each medication and dose if known) losartan (COZAAR) 25 MG tablet  2. Which pharmacy/location (including street and city if local pharmacy) is medication to be sent to?[place], [place]  3. Do they need a 30 day or 90 day supply? [place]

## 2018-05-24 NOTE — Telephone Encounter (Signed)
New Message:         *STAT* If patient is at the pharmacy, call can be transferred to refill team.   1. Which medications need to be refilled? (please list name of each medication and dose if known) losartan (COZAAR) 25 MG tablet  2. Which pharmacy/location (including street and city if local pharmacy) is medication to be sent to?[place], [place] - [address]  3. Do they need a 30 day or 90 day supply? [place]

## 2018-05-25 MED ORDER — LOSARTAN POTASSIUM 25 MG PO TABS: 25.0000 mg | ORAL_TABLET | Freq: Every day | ORAL | 0 refills | Status: DC

## 2018-05-25 NOTE — Telephone Encounter (Signed)
RX sent 90 day supply to pharmacy. 

## 2018-06-04 ENCOUNTER — Ambulatory Visit (INDEPENDENT_AMBULATORY_CARE_PROVIDER_SITE_OTHER): Payer: Medicare Other

## 2018-06-04 DIAGNOSIS — Z95810 Presence of automatic (implantable) cardiac defibrillator: Secondary | ICD-10-CM

## 2018-06-04 DIAGNOSIS — I5022 Chronic systolic (congestive) heart failure: Secondary | ICD-10-CM

## 2018-06-04 NOTE — Progress Notes (Signed)
EPIC Encounter for ICM Monitoring  [name] is a 72 y.o. male Date: 06/04/2018 Primary Care Physican: [name], MD Primary [place] Electrophysiologist: Caryl Comes Dry Weight:204.4 lbs  Bi-V Pacing: 98.5%        Heart Failure questions reviewed, pt asymptomatic.  He reported at office visit with Dr Martinique that his fluid intake was reduced to 1500 cc daily and restrict salt intake.  He said it is hard but he is trying.    Thoracic impedance normal.  Prescribed dosage: Furosemide40 mg1 tablet daily.Potassium 20 mEq 1 tablet daily andTake additional tablet when taking extra lasix  Labs: 05/17/2018 Creatinine 1.65, BUN 31, Potassium 4.4, Sodium 146, EGFR 41-48, BNP 268.9 11/30/2017 Creatinine 1.39, BUN 29, Potassium 4.6, Sodium 143, EGFR 51-59 10/26/2017 Creatinine1.42, BUN23, Potassium4.6, [id number], EGFR50-58 10/17/2017 Creatinine1.62, BUN28, Potassium4.9, [id number], [id number]  10/02/2017 Creatinine1.42, BUN18, Potassium3.7, [id number], [id number]  10/01/2017 Creatinine1.28, BUN22, Potassium3.9, [id number], EGFR55->60  09/30/2017 Creatinine1.81, BUN35, Potassium3.5, [id number], [id number]  09/29/2017 Creatinine2.71, BUN44, Potassium3.7, [id number], TVVL31-74  05/23/2017 Creatinine1.33, BUN20, Potassium3.5, [id number], [id number] A complete set of results can be found in Results Review.  Recommendations: Continue salt and fluid restriction.  Encouraged to call for fluid symptoms.  Follow-up plan: ICM clinic phone appointment on 07/05/2018.  Office appointment scheduled 06/28/2018 with Dr. Martinique.  Recall appt 11/19/2018 with Dr Caryl Comes.  Copy of ICM check sent to Dr. Caryl Comes.   3 month ICM trend: 06/04/2018    1 Year ICM trend:       [name], RN 06/04/2018 1:35 PM

## 2018-06-13 ENCOUNTER — Other Ambulatory Visit: Payer: Self-pay | Admitting: Internal Medicine

## 2018-06-13 ENCOUNTER — Other Ambulatory Visit: Payer: Self-pay | Admitting: Cardiology

## 2018-06-13 DIAGNOSIS — I5022 Chronic systolic (congestive) heart failure: Secondary | ICD-10-CM

## 2018-06-19 ENCOUNTER — Telehealth: Payer: Self-pay | Admitting: Cardiology

## 2018-06-19 DIAGNOSIS — N183 Chronic kidney disease, stage 3 (moderate): Secondary | ICD-10-CM

## 2018-06-19 DIAGNOSIS — I5022 Chronic systolic (congestive) heart failure: Secondary | ICD-10-CM

## 2018-06-19 DIAGNOSIS — N179 Acute kidney failure, unspecified: Secondary | ICD-10-CM

## 2018-06-19 MED ORDER — FUROSEMIDE 40 MG PO TABS: 40.0000 mg | ORAL_TABLET | Freq: Every day | ORAL | 0 refills | Status: DC

## 2018-06-19 MED ORDER — DIGOXIN 125 MCG PO TABS: 0.0625 mg | ORAL_TABLET | Freq: Every day | ORAL | 0 refills | Status: DC

## 2018-06-19 NOTE — Telephone Encounter (Signed)
Follow up    Patient also needs refill on  furosemide (LASIX) 40 MG tablet. Patient is out of the medication

## 2018-06-19 NOTE — Telephone Encounter (Signed)
New Message:       *STAT* If patient is at the pharmacy, call can be transferred to refill team.   1. Which medications need to be refilled? (please list name of each medication and dose if known) digoxin (LANOXIN) 0.125 MG tablet  2. Which pharmacy/location (including street and city if local pharmacy) is medication to be sent to?[place], [place] - [address]  3. Do they need a 30 day or 90 day supply? 30      Pt states he has not had this medication in 7 days. Pt states he is needing a prescription override to get this medication at his local pharmacy

## 2018-06-27 NOTE — Progress Notes (Signed)
Cardiology Office Note    Date:  06/28/2018   ID:  Horice, Carrero [birth year], MRN [id number]  PCP:  [name], MD  Cardiologist:  Dr. Joseandres Mazer [place]   Chief Complaint  Patient presents with  . Follow-up  . Congestive Heart Failure    History of Present Illness:  [name] is a 72 y.o. male with PMH of CAD s/p remote anterior MI, ICM s/p CRT-D, atrial fibrillation, and HLD. He had generator change placed in September 2014. He is intolerant of amiodarone. His QT interval was too long for Tikosyn. He previously attempted to increase his carvedilol dose, however developed significant hypotension.   In October 2017, he had volume overloaded indicated by his Optival reading, Lasix was increased to 40 mg twice a day. He was started on Entresto but developed hypotension, he was later transitioned back to losartan. Due to persistent atrial fibrillation with elevated heart rate, he underwent AV nodal ablation in February 2018.  He was admitted to the hospital on 09/30/2017 with renal insufficiency. He reported episode of weakness. He was also treated for SIRS and sepsis with temp 102 and tachypnea. White blood cell count was 16.6. Initial lactic acid 2.14. He was hydrated with IV fluid. Troponin was borderline elevated, this was felt to be related to elevation of creatinine up to 2.71. He was treated with vancomycin, Levaquin and aztreonam. Culture was negative. Repeat echocardiogram obtained on 10/01/2017 continues showed EF 25-30%, mild MR, mild-to-moderate TR, akinesis of the apical anterior, lateral, inferolateral, inferior, apical myocardium with no evidence of apical thrombus by Definity contrast, PA peak pressure 37 mmHg.  On follow up today he is doing OK. Ran out of his lasix and Digoxin in late June and missed several days. Resume on July 3rd. Weight got up to 207.8 but has decreased to 205.6. Dry weight is close to 200. Wife reports he is doing better with sodium  restriction and fluid restriction. Drinking unsweet tea instead of sodas. Notes SOB when doing some exertion in the hot/humid weather. No dizziness, syncope, chest pain.  Past Medical History:  Diagnosis Date  . AICD (automatic cardioverter/defibrillator) present   . Anemia   . Anxiety   . CHF (congestive heart failure) ([place])   . Chronic renal insufficiency   . Coronary artery disease   . Depression   . Dyslipidemia   . Dysrhythmia    atrial fibrillation  . ED (erectile dysfunction)   . GERD (gastroesophageal reflux disease)   . Incisional hernia, without obstruction or gangrene 04/10/2017  . Ischemic cardiomyopathy    EF 23%  . MI (myocardial infarction) ([place]) 2000   ANTERIOR, s/p PCI  . Other primary cardiomyopathies   . Peripheral vascular disease ([place])    dvt legs 2017  . Persistent atrial fibrillation (HCC)    on coumadin  . Presence of permanent cardiac pacemaker   . Shortness of breath dyspnea    on exertion    Past Surgical History:  Procedure Laterality Date  . AV NODE ABLATION N/A 01/25/2017   Procedure: AV Node Ablation;  Surgeon: [name], MD;  Location: [facility];  Service: Cardiovascular;  Laterality: N/A;  . AV NODE ABLATION  01/25/2017  . BIV ICD GENERTAOR CHANGE OUT N/A 09/16/2013   Procedure: BIV ICD GENERTAOR CHANGE OUT;  Surgeon: [name], MD;  Location[facility] CATH LAB;  Service: Cardiovascular;  Laterality: N/A;  . BREAST LUMPECTOMY Left   . CARDIAC CATHETERIZATION  02/05/2008  MODERATE TO SEVERE LEFT VENTRICULAR  DYSFUNCTION WITH EF 25-30%  . CARDIAC DEFIBRILLATOR PLACEMENT  2009   MDT BI-VENTRICULAR ICD by Dr Caryl Comes  . COLONOSCOPY WITH PROPOFOL N/A 08/31/2016   Procedure: COLONOSCOPY WITH PROPOFOL;  Surgeon: Wilford Corner, MD;  Location: [facility] ENDOSCOPY;  Service: Endoscopy;  Laterality: N/A;  . CORONARY ANGIOPLASTY  2000   stent  . CORONARY STENT PLACEMENT     LAD  . CYSTOSCOPY N/A 01/25/2016   Procedure: CYSTOSCOPY;  Surgeon:  Carolan Clines, MD;  Location: [facility];  Service: Urology;  Laterality: N/A;  . ESOPHAGOGASTRODUODENOSCOPY (EGD) WITH PROPOFOL N/A 08/31/2016   Procedure: ESOPHAGOGASTRODUODENOSCOPY (EGD) WITH PROPOFOL;  Surgeon: Wilford Corner, MD;  Location: [facility] ENDOSCOPY;  Service: Endoscopy;  Laterality: N/A;  . FRACTURE SURGERY Right 1972   leg  . IMPLANTABLE CARDIOVERTER DEFIBRILLATOR GENERATOR CHANGE  2014  . INCISIONAL HERNIA REPAIR N/A 04/10/2017   Procedure: OPEN REPAIR INCISIONAL VENTRAL HERNIA;  Surgeon: [name], MD;  Location: [facility];  Service: General;  Laterality: N/A;  . INSERT / REPLACE / REMOVE PACEMAKER     ICD  . INSERTION OF MESH N/A 04/10/2017   Procedure: INSERTION OF MESH;  Surgeon: [name], MD;  Location: [facility];  Service: General;  Laterality: N/A;  . LAPAROTOMY N/A 02/22/2016   Procedure: EXPLORATORY LAPAROTOMY WITH  PATCH OF DUODENAL ULCER;  Surgeon: Rolm Bookbinder, MD;  Location: [facility];  Service: General;  Laterality: N/A;  . MULTIPLE TOOTH EXTRACTIONS    . TRANSTHORACIC ECHOCARDIOGRAM  12/2010   EF 30-35%  . TRANSURETHRAL RESECTION OF PROSTATE N/A 01/25/2016   Procedure: TRANSURETHRAL RESECTION OF THE PROSTATE (TURP);  Surgeon: Carolan Clines, MD;  Location: [facility];  Service: Urology;  Laterality: N/A;  . WRIST FRACTURE SURGERY Left 1972    Current Medications: Outpatient Medications Prior to Visit  Medication Sig Dispense Refill  . acetaminophen (TYLENOL) 500 MG tablet Take 500 mg by mouth every 8 (eight) hours as needed for mild pain.    Marland Kitchen digoxin (LANOXIN) 0.125 MG tablet TAKE ONE-HALF TABLET BY  MOUTH DAILY 45 tablet 8  . digoxin (LANOXIN) 0.125 MG tablet Take 0.5 tablets (0.0625 mg total) by mouth daily. 15 tablet 0  . fenofibrate 160 MG tablet TAKE 1 TABLET BY MOUTH  DAILY 90 tablet 3  . ferrous sulfate 325 (65 FE) MG tablet Take 325 mg by mouth 2 (two) times daily.    . folic acid (FOLVITE) 308 MCG tablet Take 800 mcg by mouth daily.     . furosemide  (LASIX) 40 MG tablet TAKE 1 TABLET BY MOUTH  DAILY 90 tablet 3  . furosemide (LASIX) 40 MG tablet Take 1 tablet (40 mg total) by mouth daily. 30 tablet 0  . losartan (COZAAR) 25 MG tablet TAKE 1 TABLET BY MOUTH  DAILY 90 tablet 3  . metoprolol succinate (TOPROL-XL) 25 MG 24 hr tablet Take 1 tablet (25 mg total) by mouth daily at 12 noon. 90 tablet 0  . Multiple Vitamins-Minerals (MULTIVITAMIN ADULTS PO) Take 1 tablet by mouth daily.    . nitroGLYCERIN (NITROSTAT) 0.4 MG SL tablet Place 1 tablet (0.4 mg total) under the tongue every 5 (five) minutes as needed for chest pain. 25 tablet 3  . pantoprazole (PROTONIX) 40 MG tablet TAKE 2 TABLETS BY MOUTH  DAILY 180 tablet 3  . potassium chloride SA (K-DUR,KLOR-CON) 20 MEQ tablet Take 1 tablet (20 mEq total) by mouth daily. Take additional tablet when taking extra lasix 135 tablet 0  .  pravastatin (PRAVACHOL) 40 MG tablet TAKE 1 TABLET BY MOUTH  DAILY 90 tablet 3  . sertraline (ZOLOFT) 100 MG tablet Take 100 mg by mouth daily.      . vitamin C (ASCORBIC ACID) 250 MG tablet Take 250 mg by mouth 2 (two) times daily.    Marland Kitchen warfarin (COUMADIN) 5 MG tablet Take 0.5 tablets (2.5 mg total) by mouth daily at 6 PM. (Patient taking differently: Take 1 mg by mouth daily at 6 PM. ) 30 tablet 0   No facility-administered medications prior to visit.      Allergies:   Keflex [cephalexin]   Social History   Socioeconomic History  . Marital status: Married    Spouse name: Not on file  . Number of children: 2  . Years of education: Not on file  . Highest education level: Not on file  Occupational History  . Occupation: disabled    Employer: RETIRED  Social Needs  . Financial resource strain: Not on file  . Food insecurity:    Worry: Not on file    Inability: Not on file  . Transportation needs:    Medical: Not on file    Non-medical: Not on file  Tobacco Use  . Smoking status: Former Smoker    Types: Cigarettes    Last attempt to quit: 12/23/1998     Years since quitting: 19.5  . Smokeless tobacco: Never Used  Substance and Sexual Activity  . Alcohol use: No  . Drug use: No  . Sexual activity: Not on file  Lifestyle  . Physical activity:    Days per week: Not on file    Minutes per session: Not on file  . Stress: Not on file  Relationships  . Social connections:    Talks on phone: Not on file    Gets together: Not on file    Attends religious service: Not on file    Active member of club or organization: Not on file    Attends meetings of clubs or organizations: Not on file    Relationship status: Not on file  Other Topics Concern  . Not on file  Social History Narrative   Lives in [place].  Retired Teacher, English as a foreign language           Family History:  The patient's family history includes Breast cancer in his mother and sister; Depression in his mother; Heart attack in his father.   ROS:   Please see the history of present illness.    ROS All other systems reviewed and are negative.   PHYSICAL EXAM:   VS:  BP 104/64   Pulse 86   Ht 5\' 10"  (1.778 m)   Wt 206 lb 6.4 oz (93.6 kg)   BMI 29.62 kg/m    GENERAL:  Well appearing WM in NAD HEENT:  PERRL, EOMI, sclera are clear. Oropharynx is clear. NECK:  No jugular venous distention, carotid upstroke brisk and symmetric, no bruits, no thyromegaly or adenopathy LUNGS:  Clear to auscultation bilaterally CHEST:  Unremarkable HEART:  RRR,  PMI not displaced or sustained,S1 and S2 within normal limits, no S3, no S4: no clicks, no rubs, no murmurs ABD:  Soft, nontender. BS +, no masses or bruits. No hepatomegaly, no splenomegaly EXT:  2 + pulses throughout, 1+ edema, no cyanosis no clubbing SKIN:  Warm and dry.  No rashes NEURO:  Alert and oriented x 3. Cranial nerves II through XII intact. PSYCH:  Cognitively intact      Wt  Readings from Last 3 Encounters:  06/28/18 206 lb 6.4 oz (93.6 kg)  05/17/18 210 lb (95.3 kg)  05/02/18 220 lb 3.2 oz (99.9 kg)      Studies/Labs  Reviewed:   EKG:  EKG is not ordered today.    Recent Labs: 09/30/2017: ALT 22 10/02/2017: Hemoglobin 10.2; Magnesium 1.7; Platelets 149 05/17/2018: BNP 268.9; BUN 31; Creatinine, Ser 1.65; Potassium 4.4; Sodium 146   Lipid Panel    Component Value Date/Time   CHOL 112 02/27/2016 0500   TRIG 53 02/29/2016 0509   HDL 32 (L) 02/27/2016 0500   CHOLHDL 3.5 02/27/2016 0500   VLDL 14 02/27/2016 0500   LDLCALC 66 02/27/2016 0500   LDLDIRECT 137.6 01/06/2012 1029   Labs dated 05/01/18: BUN 41, creatinine 1.72. Other chemistries normal. Dig level 0.2. WBC 7.7. Hgb 11.8.   Additional studies/ records that were reviewed today include:   Echo 10/01/2017 LV EF: 25% -   30%  Study Conclusions  - Left ventricle: The cavity size was normal. Systolic function was   severely reduced. The estimated ejection fraction was in the   range of 25% to 30%. There is akinesis of the apical anterior,   lateral, inferolateral, inferior, and apical myocardium with no   evidence of apical thrombus by definity contrast. The study is   not technically sufficient to allow evaluation of LV diastolic   function. Doppler parameters are consistent with high ventricular   filling pressure. - Mitral valve: There was mild regurgitation. - Left atrium: The atrium was mildly dilated. - Tricuspid valve: There was mild-moderate regurgitation. - Pulmonic valve: There was trivial regurgitation. - Pulmonary arteries: PA peak pressure: 37 mm Hg (S).  Impressions:  - The right ventricular systolic pressure was increased consistent   with mild pulmonary hypertension.   ASSESSMENT:    1. Chronic systolic CHF (congestive heart failure) ([place])   2. Biventricular implantable cardioverter-defibrillator in situ   3. Coronary artery disease involving native coronary artery of native heart without angina pectoris   4. PAF (paroxysmal atrial fibrillation) (HCC)   5. Stage 3 chronic kidney disease ([place])      PLAN:    In order of problems listed above:  1. Chronic systolic heart failure: weight is still a little above dry weight. 1+ edema. Lungs are clear. Will increase lasix to 40 mg in the am and 20 mg in the pm. Check a BMET and dig level in 2 weeks.   Instructed on importance of sodium and fluid restriction.   2. ICM s/p ICD:   Device check in April was good with BiV pacing. Plan to recheck end of this month.  3. CAD: Denies any anginal symptoms.  4. Hyperlipidemia: On Pravachol 40 mg daily.  5. Persistent atrial fibrillation: He had AV nodal ablation in early 2018.  Continue Coumadin  6. CKD stage 3. Monitor closely with adjustment in lasix dose.     Medication Adjustments/Labs and Tests Ordered: Current medicines are reviewed at length with the patient today.  Concerns regarding medicines are outlined above.  Medication changes, Labs and Tests ordered today are listed in the Patient Instructions below. Patient Instructions  Increase lasix to 40 mg in the morning and 20 mg in the evening.  We will check blood work in 2 weeks.      Signed, Ahkeem Goede Martinique, MD  06/28/2018 9:48 AM    [facility] [address], [place], [place]  [address] Phone: [phone]; Fax: [phone]

## 2018-06-28 ENCOUNTER — Ambulatory Visit: Payer: Medicare Other | Admitting: Cardiology

## 2018-06-28 ENCOUNTER — Encounter: Payer: Self-pay | Admitting: Cardiology

## 2018-06-28 VITALS — BP 104/64 | HR 86 | Ht 70.0 in | Wt 206.4 lb

## 2018-06-28 DIAGNOSIS — I480 Paroxysmal atrial fibrillation: Secondary | ICD-10-CM | POA: Diagnosis not present

## 2018-06-28 DIAGNOSIS — Z95810 Presence of automatic (implantable) cardiac defibrillator: Secondary | ICD-10-CM | POA: Diagnosis not present

## 2018-06-28 DIAGNOSIS — N183 Chronic kidney disease, stage 3 unspecified: Secondary | ICD-10-CM

## 2018-06-28 DIAGNOSIS — I5022 Chronic systolic (congestive) heart failure: Secondary | ICD-10-CM | POA: Diagnosis not present

## 2018-06-28 DIAGNOSIS — Z5181 Encounter for therapeutic drug level monitoring: Secondary | ICD-10-CM | POA: Diagnosis not present

## 2018-06-28 DIAGNOSIS — I2510 Atherosclerotic heart disease of native coronary artery without angina pectoris: Secondary | ICD-10-CM

## 2018-06-28 NOTE — Patient Instructions (Addendum)
Increase lasix to 40 mg in the morning and 20 mg in the evening.  We will check blood work in 2 weeks. When you are called with the labs results remind [place] to send new Rx for Lasix to mail order   Your physician recommends that you schedule a follow-up appointment in: 3 months

## 2018-07-05 ENCOUNTER — Ambulatory Visit (INDEPENDENT_AMBULATORY_CARE_PROVIDER_SITE_OTHER): Payer: Medicare Other | Admitting: *Deleted

## 2018-07-05 ENCOUNTER — Ambulatory Visit (INDEPENDENT_AMBULATORY_CARE_PROVIDER_SITE_OTHER): Payer: Medicare Other

## 2018-07-05 DIAGNOSIS — Z95810 Presence of automatic (implantable) cardiac defibrillator: Secondary | ICD-10-CM

## 2018-07-05 DIAGNOSIS — I5022 Chronic systolic (congestive) heart failure: Secondary | ICD-10-CM | POA: Diagnosis not present

## 2018-07-05 DIAGNOSIS — I255 Ischemic cardiomyopathy: Secondary | ICD-10-CM | POA: Diagnosis not present

## 2018-07-05 NOTE — Progress Notes (Signed)
Remote ICD transmission.   

## 2018-07-05 NOTE — Progress Notes (Signed)
EPIC Encounter for ICM Monitoring  [name] is a 72 y.o. male Date: 07/05/2018 Primary Care Physican: [name], MD Primary [place] Electrophysiologist: Faustino Congress Weight: Previous weight 204.4lbs  Bi-V Pacing: 98.2%        Attempted call to patient and unable to reach.  Left detailed message, per DPR, regarding transmission.  Transmission reviewed.    Thoracic impedance normal but was abnormal suggesting fluid accumulation from 06/12/2018 - 06/28/2018. Decreased impedance correlates with patient ran out of Lasix end of June and resumed 7/3 per Dr Doug Sou last [place] note.   Prescribed dosage: Furosemide40 mgtake 1 tablet by mouth in the morning an 1/2 tablet in the afternoon.Potassium 20 mEq 1 tablet daily andTake additional tablet when taking extra lasix  Labs: 05/17/2018 Creatinine 1.65, BUN 31, Potassium 4.4, Sodium 146, EGFR 41-48, BNP 268.9 11/30/2017 Creatinine 1.39, BUN 29, Potassium 4.6, Sodium 143, EGFR 51-59 10/26/2017 Creatinine1.42, BUN23, Potassium4.6, [id number], EGFR50-58 10/17/2017 Creatinine1.62, BUN28, Potassium4.9, [id number], YBOF75-10  10/02/2017 Creatinine1.42, BUN18, Potassium3.7, [id number], UMPN36-14  10/01/2017 Creatinine1.28, BUN22, Potassium3.9, [id number], EGFR55->60  09/30/2017 Creatinine1.81, BUN35, Potassium3.5, [id number], IZTI45-80  09/29/2017 Creatinine2.71, BUN44, Potassium3.7, [id number], NLZJ67-34  05/23/2017 Creatinine1.33, BUN20, Potassium3.5, [id number], XBDZ32-99 A complete set of results can be found in Results Review.  Recommendations: Left voice mail with ICM number and encouraged to call if experiencing any fluid symptoms.  Follow-up plan: ICM clinic phone appointment on 08/06/2018.      Copy of ICM check sent to Dr. Caryl Comes.   3 month ICM trend: 07/05/2018    1 Year ICM trend:       [name], RN 07/05/2018 10:12 AM

## 2018-07-06 ENCOUNTER — Encounter: Payer: Self-pay | Admitting: Cardiology

## 2018-07-12 LAB — CUP PACEART REMOTE DEVICE CHECK
Brady Statistic AP VP Percent: 0 %
Brady Statistic AS VS Percent: 1.2 %
Brady Statistic RA Percent Paced: 0 %
Brady Statistic RV Percent Paced: 98.17 %
Date Time Interrogation Session: 20190718073525
HighPow Impedance: 49 Ohm
HighPow Impedance: 56 Ohm
Implantable Lead Implant Date: 20090226
Implantable Lead Implant Date: 20090226
Implantable Lead Location: 753858
Implantable Lead Model: 4194
Implantable Lead Model: 6947
Implantable Pulse Generator Implant Date: 20140929
Lead Channel Impedance Value: 285 Ohm
Lead Channel Impedance Value: 513 Ohm
Lead Channel Impedance Value: 551 Ohm
Lead Channel Impedance Value: 703 Ohm
Lead Channel Pacing Threshold Amplitude: 0.625 V
Lead Channel Sensing Intrinsic Amplitude: 4.25 mV
Lead Channel Sensing Intrinsic Amplitude: 4.25 mV
Lead Channel Setting Pacing Amplitude: 1.75 V
Lead Channel Setting Sensing Sensitivity: 0.3 mV
MDC IDC LEAD IMPLANT DT: 20090226
MDC IDC LEAD LOCATION: 753859
MDC IDC LEAD LOCATION: 753860
MDC IDC MSMT BATTERY REMAINING LONGEVITY: 17 mo
MDC IDC MSMT BATTERY VOLTAGE: 2.91 V
MDC IDC MSMT LEADCHNL LV PACING THRESHOLD PULSEWIDTH: 0.6 ms
MDC IDC MSMT LEADCHNL RV IMPEDANCE VALUE: 323 Ohm
MDC IDC MSMT LEADCHNL RV IMPEDANCE VALUE: 380 Ohm
MDC IDC MSMT LEADCHNL RV SENSING INTR AMPL: 16.25 mV
MDC IDC MSMT LEADCHNL RV SENSING INTR AMPL: 16.25 mV
MDC IDC SET LEADCHNL LV PACING PULSEWIDTH: 0.6 ms
MDC IDC SET LEADCHNL RV PACING AMPLITUDE: 2.5 V
MDC IDC SET LEADCHNL RV PACING PULSEWIDTH: 0.6 ms
MDC IDC STAT BRADY AP VS PERCENT: 0 %
MDC IDC STAT BRADY AS VP PERCENT: 98.8 %

## 2018-07-12 LAB — BASIC METABOLIC PANEL
BUN / CREAT RATIO: 18 (ref 10–24)
BUN: 30 mg/dL — ABNORMAL HIGH (ref 8–27)
CALCIUM: 9.8 mg/dL (ref 8.6–10.2)
CHLORIDE: 102 mmol/L (ref 96–106)
CO2: 26 mmol/L (ref 20–29)
Creatinine, Ser: 1.64 mg/dL — ABNORMAL HIGH (ref 0.76–1.27)
GFR calc non Af Amer: 42 mL/min/{1.73_m2} — ABNORMAL LOW (ref >59)
GFR, EST AFRICAN AMERICAN: 48 mL/min/{1.73_m2} — AB (ref >59)
GLUCOSE: 88 mg/dL (ref 65–99)
POTASSIUM: 4.1 mmol/L (ref 3.5–5.2)
Sodium: 144 mmol/L (ref 134–144)

## 2018-07-12 LAB — DIGOXIN LEVEL

## 2018-07-16 ENCOUNTER — Encounter: Payer: Self-pay | Admitting: *Deleted

## 2018-08-06 ENCOUNTER — Ambulatory Visit (INDEPENDENT_AMBULATORY_CARE_PROVIDER_SITE_OTHER): Payer: Medicare Other

## 2018-08-06 DIAGNOSIS — I5022 Chronic systolic (congestive) heart failure: Secondary | ICD-10-CM

## 2018-08-06 DIAGNOSIS — Z95810 Presence of automatic (implantable) cardiac defibrillator: Secondary | ICD-10-CM | POA: Diagnosis not present

## 2018-08-06 NOTE — Progress Notes (Signed)
EPIC Encounter for ICM Monitoring  [name] is a 72 y.o. male Date: 08/06/2018 Primary Care Physican: [name], MD Primary [place] Electrophysiologist: Faustino Congress Weight: Previous weight 204.4lbs  Bi-V Pacing: 97.9%      Attempted call to patient and unable to reach.  Left detailed message, per DPR, regarding transmission.  Transmission reviewed.    Thoracic impedance normal.  Prescribed dosage: Furosemide40 mgtake 1 tablet by mouth in the morning an 1/2 tablet in the afternoon.Potassium 20 mEq 1 tablet daily andTake additional tablet when taking extra lasix  Labs: 05/17/2018 Creatinine 1.65, BUN 31, Potassium 4.4, Sodium 146, EGFR 41-48, BNP 268.9 11/30/2017 Creatinine 1.39, BUN 29, Potassium 4.6, Sodium 143, EGFR 51-59 10/26/2017 Creatinine1.42, BUN23, Potassium4.6, [id number], EGFR50-58 10/17/2017 Creatinine1.62, BUN28, Potassium4.9, [id number], VDFP79-21  10/02/2017 Creatinine1.42, BUN18, Potassium3.7, [id number], KCXW17-20  10/01/2017 Creatinine1.28, BUN22, Potassium3.9, PPUGGP661, EGFR55->60  09/30/2017 Creatinine1.81, BUN35, Potassium3.5, [id number], CLTV98-24  09/29/2017 Creatinine2.71, BUN44, Potassium3.7, ORTQSY999, MVAE77-37  05/23/2017 Creatinine1.33, BUN20, Potassium3.5, HGRJWB125, EUXB98-00 A complete set of results can be found in Results Review.  Recommendations: Left voice mail with ICM number and encouraged to call if experiencing any fluid symptoms.  Follow-up plan: ICM clinic phone appointment on 09/13/2018.   Office appointment scheduled 10/04/2018 with Dr. Martinique.    Copy of ICM check sent to Dr. Caryl Comes.   3 month ICM trend: 08/06/2018    1 Year ICM trend:       [name], RN 08/06/2018 11:52 AM

## 2018-08-07 ENCOUNTER — Telehealth: Payer: Self-pay

## 2018-08-07 NOTE — Telephone Encounter (Signed)
Remote ICM transmission received.  Attempted call to patient and left detailed message, per DPR, regarding transmission and next ICM scheduled for 09/13/2018.  Advised to return call for any fluid symptoms or questions.   

## 2018-08-10 ENCOUNTER — Other Ambulatory Visit: Payer: Self-pay | Admitting: Cardiology

## 2018-08-10 DIAGNOSIS — I5022 Chronic systolic (congestive) heart failure: Secondary | ICD-10-CM

## 2018-09-13 ENCOUNTER — Ambulatory Visit (INDEPENDENT_AMBULATORY_CARE_PROVIDER_SITE_OTHER): Payer: Medicare Other

## 2018-09-13 ENCOUNTER — Telehealth: Payer: Self-pay

## 2018-09-13 DIAGNOSIS — I5022 Chronic systolic (congestive) heart failure: Secondary | ICD-10-CM

## 2018-09-13 DIAGNOSIS — Z95810 Presence of automatic (implantable) cardiac defibrillator: Secondary | ICD-10-CM | POA: Diagnosis not present

## 2018-09-13 NOTE — Telephone Encounter (Signed)
Spoke with pt and reminded pt of remote transmission that is due today. Pt verbalized understanding.   

## 2018-09-14 NOTE — Progress Notes (Signed)
EPIC Encounter for ICM Monitoring  [name] is a 72 y.o. male Date: 09/14/2018 Primary Care Physican: [name], MD Primary [place] Electrophysiologist: Faustino Congress Weight: 211.6lbs  Bi-V Pacing: 97.2%             Heart Failure questions reviewed, pt reported legs are swollen and has been on his feet a lot.  He also had 2-3 lb weight gain.  He has not been able to limit salt intake due to busy with moving residences from [place] to [place]. He requested to change address to [address], [place].     Thoracic impedance trending just below baseline suggesting fluid accumulation.  Prescribed: Furosemide40 mgtake 1 tablet by mouth in the morning an 1/2 tablet in the afternoon.Potassium 20 mEq 1 tablet daily andTake additional tablet when taking extra lasix  Labs: 07/12/2018 Creatinine 1.64, BUN 30, Potassium 4.1, Sodium 144, EGFR 42-48 05/17/2018 Creatinine 1.65, BUN 31, Potassium 4.4, Sodium 146, EGFR 41-48, BNP 268.9 11/30/2017 Creatinine 1.39, BUN 29, Potassium 4.6, Sodium 143, EGFR 51-59 10/26/2017 Creatinine1.42, BUN23, Potassium4.6, [id number], EGFR50-58 10/17/2017 Creatinine1.62, BUN28, Potassium4.9, [id number], ZDKE09-90  10/02/2017 Creatinine1.42, BUN18, Potassium3.7, [id number], ATVV33-17  10/01/2017 Creatinine1.28, BUN22, Potassium3.9, WWZLYT800, EGFR55->60  09/30/2017 Creatinine1.81, BUN35, Potassium3.5, SYPZXA063, EQUH48-83  09/29/2017 Creatinine2.71, BUN44, Potassium3.7, GXEXPF733, JGJG87-19  05/23/2017 Creatinine1.33, BUN20, Potassium3.5, BOZWRK475, VDFP79-21 A complete set of results can be found in Results Review.  Recommendations:  Encouraged to limit salt intake and call if fluid symptoms worsen.  Follow-up plan: ICM clinic phone appointment on 09/27/2018 to recheck fluid levels.   Office appointment scheduled 10/04/2018 with Dr. Martinique.    Copy of  ICM check sent to Dr. Caryl Comes and Dr Martinique.   3 month ICM trend: 09/13/2018    1 Year ICM trend:       [name], RN 09/14/2018 1:04 PM

## 2018-09-27 ENCOUNTER — Telehealth: Payer: Self-pay

## 2018-09-27 NOTE — Telephone Encounter (Signed)
LMOVM reminding pt to send remote transmission.   

## 2018-10-01 NOTE — Progress Notes (Deleted)
Cardiology Office Note    Date:  10/01/2018   ID:  [name], [name] 1946/10/07, MRN [id number]  PCP:  [name], MD  Cardiologist:  Dr. Jacynda Brunke [place]   No chief complaint on file.   History of Present Illness:  [name] is a 72 y.o. male with PMH of CAD s/p remote anterior MI, ICM s/p CRT-D, atrial fibrillation, and HLD. He had generator change placed in September 2014. He is intolerant of amiodarone. His QT interval was too long for Tikosyn. He previously attempted to increase his carvedilol dose, however developed significant hypotension.   In October 2017, he had volume overloaded indicated by his Optival reading, Lasix was increased to 40 mg twice a day. He was started on Entresto but developed hypotension, he was later transitioned back to losartan. Due to persistent atrial fibrillation with elevated heart rate, he underwent AV nodal ablation in February 2018.  He was admitted to the hospital on 09/30/2017 with renal insufficiency. He reported episode of weakness. He was also treated for SIRS and sepsis with temp 102 and tachypnea. White blood cell count was 16.6. Initial lactic acid 2.14. He was hydrated with IV fluid. Troponin was borderline elevated, this was felt to be related to elevation of creatinine up to 2.71. He was treated with vancomycin, Levaquin and aztreonam. Culture was negative. Repeat echocardiogram obtained on 10/01/2017 continues showed EF 25-30%, mild MR, mild-to-moderate TR, akinesis of the apical anterior, lateral, inferolateral, inferior, apical myocardium with no evidence of apical thrombus by Definity contrast, PA peak pressure 37 mmHg.  On follow up today he is doing OK. Ran out of his lasix and Digoxin in late June and missed several days. Resume on July 3rd. Weight got up to 207.8 but has decreased to 205.6. Dry weight is close to 200. Wife reports he is doing better with sodium restriction and fluid restriction. Drinking unsweet tea instead  of sodas. Notes SOB when doing some exertion in the hot/humid weather. No dizziness, syncope, chest pain.  Past Medical History:  Diagnosis Date  . AICD (automatic cardioverter/defibrillator) present   . Anemia   . Anxiety   . CHF (congestive heart failure) ([place])   . Chronic renal insufficiency   . Coronary artery disease   . Depression   . Dyslipidemia   . Dysrhythmia    atrial fibrillation  . ED (erectile dysfunction)   . GERD (gastroesophageal reflux disease)   . Incisional hernia, without obstruction or gangrene 04/10/2017  . Ischemic cardiomyopathy    EF 23%  . MI (myocardial infarction) ([place]) 2000   ANTERIOR, s/p PCI  . Other primary cardiomyopathies   . Peripheral vascular disease ([place])    dvt legs 2017  . Persistent atrial fibrillation ([facility])    on coumadin  . Presence of permanent cardiac pacemaker   . Shortness of breath dyspnea    on exertion    Past Surgical History:  Procedure Laterality Date  . AV NODE ABLATION N/A 01/25/2017   Procedure: AV Node Ablation;  Surgeon: [name], MD;  Location: [place] [facility];  Service: Cardiovascular;  Laterality: N/A;  . AV NODE ABLATION  01/25/2017  . BIV ICD GENERTAOR CHANGE OUT N/A 09/16/2013   Procedure: BIV ICD GENERTAOR CHANGE OUT;  Surgeon: [name], MD;  Location: [facility] CATH LAB;  Service: Cardiovascular;  Laterality: N/A;  . BREAST LUMPECTOMY Left   . CARDIAC CATHETERIZATION  02/05/2008   MODERATE TO SEVERE LEFT VENTRICULAR  DYSFUNCTION WITH  EF 25-30%  . CARDIAC DEFIBRILLATOR PLACEMENT  2009   MDT BI-VENTRICULAR ICD by Dr Caryl Comes  . COLONOSCOPY WITH PROPOFOL N/A 08/31/2016   Procedure: COLONOSCOPY WITH PROPOFOL;  Surgeon: Wilford Corner, MD;  Location[facility] ENDOSCOPY;  Service: Endoscopy;  Laterality: N/A;  . CORONARY ANGIOPLASTY  2000   stent  . CORONARY STENT PLACEMENT     LAD  . CYSTOSCOPY N/A 01/25/2016   Procedure: CYSTOSCOPY;  Surgeon: Carolan Clines, MD;  Location: [facility];  Service: Urology;   Laterality: N/A;  . ESOPHAGOGASTRODUODENOSCOPY (EGD) WITH PROPOFOL N/A 08/31/2016   Procedure: ESOPHAGOGASTRODUODENOSCOPY (EGD) WITH PROPOFOL;  Surgeon: Wilford Corner, MD;  Location[facility] ENDOSCOPY;  Service: Endoscopy;  Laterality: N/A;  . FRACTURE SURGERY Right 1972   leg  . IMPLANTABLE CARDIOVERTER DEFIBRILLATOR GENERATOR CHANGE  2014  . INCISIONAL HERNIA REPAIR N/A 04/10/2017   Procedure: OPEN REPAIR INCISIONAL VENTRAL HERNIA;  Surgeon: [name], MD;  Location: [place];  Service: General;  Laterality: N/A;  . INSERT / REPLACE / REMOVE PACEMAKER     ICD  . INSERTION OF MESH N/A 04/10/2017   Procedure: INSERTION OF MESH;  Surgeon: [name], MD;  Location: [place];  Service: General;  Laterality: N/A;  . LAPAROTOMY N/A 02/22/2016   Procedure: EXPLORATORY LAPAROTOMY WITH  PATCH OF DUODENAL ULCER;  Surgeon: Rolm Bookbinder, MD;  Location: [place];  Service: General;  Laterality: N/A;  . MULTIPLE TOOTH EXTRACTIONS    . TRANSTHORACIC ECHOCARDIOGRAM  12/2010   EF 30-35%  . TRANSURETHRAL RESECTION OF PROSTATE N/A 01/25/2016   Procedure: TRANSURETHRAL RESECTION OF THE PROSTATE (TURP);  Surgeon: Carolan Clines, MD;  Location: [facility];  Service: Urology;  Laterality: N/A;  . WRIST FRACTURE SURGERY Left 1972    Current Medications: Outpatient Medications Prior to Visit  Medication Sig Dispense Refill  . acetaminophen (TYLENOL) 500 MG tablet Take 500 mg by mouth every 8 (eight) hours as needed for mild pain.    Marland Kitchen digoxin (LANOXIN) 0.125 MG tablet TAKE ONE-HALF TABLET BY  MOUTH DAILY 45 tablet 8  . digoxin (LANOXIN) 0.125 MG tablet Take 0.5 tablets (0.0625 mg total) by mouth daily. 15 tablet 0  . fenofibrate 160 MG tablet TAKE 1 TABLET BY MOUTH  DAILY 90 tablet 3  . ferrous sulfate 325 (65 FE) MG tablet Take 325 mg by mouth 2 (two) times daily.    . folic acid (FOLVITE) 109 MCG tablet Take 800 mcg by mouth daily.     . furosemide (LASIX) 40 MG tablet Take 40 mg by mouth as directed. Take 1  tablet by mouth in the morning an 1/2 tablet in the afternoon    . losartan (COZAAR) 25 MG tablet TAKE 1 TABLET BY MOUTH  DAILY 90 tablet 3  . metoprolol succinate (TOPROL-XL) 25 MG 24 hr tablet Take 1 tablet (25 mg total) by mouth daily at 12 noon. 90 tablet 0  . Multiple Vitamins-Minerals (MULTIVITAMIN ADULTS PO) Take 1 tablet by mouth daily.    . nitroGLYCERIN (NITROSTAT) 0.4 MG SL tablet Place 1 tablet (0.4 mg total) under the tongue every 5 (five) minutes as needed for chest pain. 25 tablet 3  . pantoprazole (PROTONIX) 40 MG tablet TAKE 2 TABLETS BY MOUTH  DAILY 180 tablet 3  . potassium chloride SA (K-DUR,KLOR-CON) 20 MEQ tablet TAKE 1 TABLET BY MOUTH  DAILY. TAKE ADDITIONAL  TABLET WHEN TAKING EXTRA  LASIX 135 tablet 0  . pravastatin (PRAVACHOL) 40 MG tablet TAKE 1 TABLET BY MOUTH  DAILY 90 tablet  3  . sertraline (ZOLOFT) 100 MG tablet Take 100 mg by mouth daily.      . vitamin C (ASCORBIC ACID) 250 MG tablet Take 250 mg by mouth 2 (two) times daily.    Marland Kitchen warfarin (COUMADIN) 5 MG tablet Take 0.5 tablets (2.5 mg total) by mouth daily at 6 PM. (Patient taking differently: Take 1 mg by mouth daily at 6 PM. ) 30 tablet 0   No facility-administered medications prior to visit.      Allergies:   Keflex [cephalexin]   Social History   Socioeconomic History  . Marital status: Married    Spouse name: Not on file  . Number of children: 2  . Years of education: Not on file  . Highest education level: Not on file  Occupational History  . Occupation: disabled    Employer: RETIRED  Social Needs  . Financial resource strain: Not on file  . Food insecurity:    Worry: Not on file    Inability: Not on file  . Transportation needs:    Medical: Not on file    Non-medical: Not on file  Tobacco Use  . Smoking status: Former Smoker    Types: Cigarettes    Last attempt to quit: 12/23/1998    Years since quitting: 19.7  . Smokeless tobacco: Never Used  Substance and Sexual Activity  . Alcohol  use: No  . Drug use: No  . Sexual activity: Not on file  Lifestyle  . Physical activity:    Days per week: Not on file    Minutes per session: Not on file  . Stress: Not on file  Relationships  . Social connections:    Talks on phone: Not on file    Gets together: Not on file    Attends religious service: Not on file    Active member of club or organization: Not on file    Attends meetings of clubs or organizations: Not on file    Relationship status: Not on file  Other Topics Concern  . Not on file  Social History Narrative   Lives in [place].  Retired Teacher, English as a foreign language           Family History:  The patient's family history includes Breast cancer in his mother and sister; Depression in his mother; Heart attack in his father.   ROS:   Please see the history of present illness.    ROS All other systems reviewed and are negative.   PHYSICAL EXAM:   VS:  There were no vitals taken for this visit.   GENERAL:  Well appearing WM in NAD HEENT:  PERRL, EOMI, sclera are clear. Oropharynx is clear. NECK:  No jugular venous distention, carotid upstroke brisk and symmetric, no bruits, no thyromegaly or adenopathy LUNGS:  Clear to auscultation bilaterally CHEST:  Unremarkable HEART:  RRR,  PMI not displaced or sustained,S1 and S2 within normal limits, no S3, no S4: no clicks, no rubs, no murmurs ABD:  Soft, nontender. BS +, no masses or bruits. No hepatomegaly, no splenomegaly EXT:  2 + pulses throughout, 1+ edema, no cyanosis no clubbing SKIN:  Warm and dry.  No rashes NEURO:  Alert and oriented x 3. Cranial nerves II through XII intact. PSYCH:  Cognitively intact      Wt Readings from Last 3 Encounters:  06/28/18 206 lb 6.4 oz (93.6 kg)  05/17/18 210 lb (95.3 kg)  05/02/18 220 lb 3.2 oz (99.9 kg)      Studies/Labs Reviewed:  EKG:  EKG is not ordered today.    Recent Labs: 10/02/2017: Hemoglobin 10.2; Magnesium 1.7; Platelets 149 05/17/2018: BNP 268.9 07/12/2018:  BUN 30; Creatinine, Ser 1.64; Potassium 4.1; Sodium 144   Lipid Panel    Component Value Date/Time   CHOL 112 02/27/2016 0500   TRIG 53 02/29/2016 0509   HDL 32 (L) 02/27/2016 0500   CHOLHDL 3.5 02/27/2016 0500   VLDL 14 02/27/2016 0500   LDLCALC 66 02/27/2016 0500   LDLDIRECT 137.6 01/06/2012 1029   Labs dated 05/01/18: BUN 41, creatinine 1.72. Other chemistries normal. Dig level 0.2. WBC 7.7. Hgb 11.8.   Additional studies/ records that were reviewed today include:   Echo 10/01/2017 LV EF: 25% -   30%  Study Conclusions  - Left ventricle: The cavity size was normal. Systolic function was   severely reduced. The estimated ejection fraction was in the   range of 25% to 30%. There is akinesis of the apical anterior,   lateral, inferolateral, inferior, and apical myocardium with no   evidence of apical thrombus by definity contrast. The study is   not technically sufficient to allow evaluation of LV diastolic   function. Doppler parameters are consistent with high ventricular   filling pressure. - Mitral valve: There was mild regurgitation. - Left atrium: The atrium was mildly dilated. - Tricuspid valve: There was mild-moderate regurgitation. - Pulmonic valve: There was trivial regurgitation. - Pulmonary arteries: PA peak pressure: 37 mm Hg (S).  Impressions:  - The right ventricular systolic pressure was increased consistent   with mild pulmonary hypertension.   ASSESSMENT:    No diagnosis found.   PLAN:  In order of problems listed above:  1. Chronic systolic heart failure: weight is still a little above dry weight. 1+ edema. Lungs are clear. Will increase lasix to 40 mg in the am and 20 mg in the pm. Check a BMET and dig level in 2 weeks.   Instructed on importance of sodium and fluid restriction.   2. ICM s/p ICD:   Device check in April was good with BiV pacing. Plan to recheck end of this month.  3. CAD: Denies any anginal symptoms.  4. Hyperlipidemia:  On Pravachol 40 mg daily.  5. Persistent atrial fibrillation: He had AV nodal ablation in early 2018.  Continue Coumadin  6. CKD stage 3. Monitor closely with adjustment in lasix dose.     Medication Adjustments/Labs and Tests Ordered: Current medicines are reviewed at length with the patient today.  Concerns regarding medicines are outlined above.  Medication changes, Labs and Tests ordered today are listed in the Patient Instructions below. There are no Patient Instructions on file for this visit.   Signed, Maie Kesinger [place], MD  10/01/2018 7:29 AM    [facility] [address], [place], [place]  [address] Phone: [phone]; Fax: [phone]

## 2018-10-02 NOTE — Progress Notes (Signed)
Next ICM remote transmission scheduled for 10/15/2018

## 2018-10-04 ENCOUNTER — Ambulatory Visit: Payer: Medicare Other | Admitting: Cardiology

## 2018-10-04 ENCOUNTER — Encounter: Payer: Medicare Other | Admitting: *Deleted

## 2018-10-04 ENCOUNTER — Telehealth: Payer: Self-pay | Admitting: Cardiology

## 2018-10-04 NOTE — Telephone Encounter (Signed)
LMOVM reminding pt to send remote transmission.   

## 2018-10-05 ENCOUNTER — Encounter: Payer: Self-pay | Admitting: Cardiology

## 2018-10-11 NOTE — Progress Notes (Signed)
Cardiology Office Note    Date:  10/11/2018   ID:  [name], [name] January 15, 1946, MRN [id number]  PCP:  [name], MD  Cardiologist:  Dr. Latonya Knight Martinique   No chief complaint on file.   History of Present Illness:  [name] is a 72 y.o. male with PMH of CAD s/p remote anterior MI, ICM s/p CRT-D, atrial fibrillation, and HLD. He had generator change placed in September 2014. He is intolerant of amiodarone. His QT interval was too long for Tikosyn. He previously attempted to increase his carvedilol dose, however developed significant hypotension.   In October 2017, he had volume overloaded indicated by his Optival reading, Lasix was increased to 40 mg twice a day. He was started on Entresto but developed hypotension, he was later transitioned back to losartan. Due to persistent atrial fibrillation with elevated heart rate, he underwent AV nodal ablation in February 2018.  He was admitted to the hospital on 09/30/2017 with renal insufficiency. He reported episode of weakness. He was also treated for SIRS and sepsis with temp 102 and tachypnea. White blood cell count was 16.6. Initial lactic acid 2.14. He was hydrated with IV fluid. Troponin was borderline elevated, this was felt to be related to elevation of creatinine up to 2.71. He was treated with vancomycin, Levaquin and aztreonam. Culture was negative. Repeat echocardiogram obtained on 10/01/2017 continues showed EF 25-30%, mild MR, mild-to-moderate TR, akinesis of the apical anterior, lateral, inferolateral, inferior, apical myocardium with no evidence of apical thrombus by Definity contrast, PA peak pressure 37 mmHg.  He was last seen in July. Ran out of his lasix and Digoxin in late June and missed several days. Resume on July 3rd. Weight got up to 207.8 but has decreased to 205.6. Dry weight is close to 200. His lasix dose was increased. Was scheduled to be seen 2 weeks later but this did not happen. Last ICD check in  September showed some change in thoracic impedence and weight was up. Patient noted he has moved to [place] and has eaten more sodium. Drinking more sodas. Had a recent fall walking his dog resulting in multiple abrasions and scalp hematoma but Xrays and CT negative for fracture. He denies SOB, CP, or palpitations. He thinks swelling is about the same.   Past Medical History:  Diagnosis Date  . AICD (automatic cardioverter/defibrillator) present   . Anemia   . Anxiety   . CHF (congestive heart failure) ([place])   . Chronic renal insufficiency   . Coronary artery disease   . Depression   . Dyslipidemia   . Dysrhythmia    atrial fibrillation  . ED (erectile dysfunction)   . GERD (gastroesophageal reflux disease)   . Incisional hernia, without obstruction or gangrene 04/10/2017  . Ischemic cardiomyopathy    EF 23%  . MI (myocardial infarction) ([place]) 2000   ANTERIOR, s/p PCI  . Other primary cardiomyopathies   . Peripheral vascular disease ([place])    dvt legs 2017  . Persistent atrial fibrillation (H[facility])    on coumadin  . Presence of permanent cardiac pacemaker   . Shortness of breath dyspnea    on exertion    Past Surgical History:  Procedure Laterality Date  . AV NODE ABLATION N/A 01/25/2017   Procedure: AV Node Ablation;  Surgeon: [name], MD;  Location: [place] [facility] LAB;  Service: Cardiovascular;  Laterality: N/A;  . AV NODE ABLATION  01/25/2017  . BIV ICD GENERTAOR CHANGE  OUT N/A 09/16/2013   Procedure: BIV ICD GENERTAOR CHANGE OUT;  Surgeon: [name], MD;  Location: [facility] CATH LAB;  Service: Cardiovascular;  Laterality: N/A;  . BREAST LUMPECTOMY Left   . CARDIAC CATHETERIZATION  02/05/2008   MODERATE TO SEVERE LEFT VENTRICULAR  DYSFUNCTION WITH EF 25-30%  . CARDIAC DEFIBRILLATOR PLACEMENT  2009   MDT BI-VENTRICULAR ICD by Dr Caryl Comes  . COLONOSCOPY WITH PROPOFOL N/A 08/31/2016   Procedure: COLONOSCOPY WITH PROPOFOL;  Surgeon: Wilford Corner, MD;   Location: [facility] ENDOSCOPY;  Service: Endoscopy;  Laterality: N/A;  . CORONARY ANGIOPLASTY  2000   stent  . CORONARY STENT PLACEMENT     LAD  . CYSTOSCOPY N/A 01/25/2016   Procedure: CYSTOSCOPY;  Surgeon: Carolan Clines, MD;  Location: [facility] ORS;  Service: Urology;  Laterality: N/A;  . ESOPHAGOGASTRODUODENOSCOPY (EGD) WITH PROPOFOL N/A 08/31/2016   Procedure: ESOPHAGOGASTRODUODENOSCOPY (EGD) WITH PROPOFOL;  Surgeon: Wilford Corner, MD;  Location: [facility] ENDOSCOPY;  Service: Endoscopy;  Laterality: N/A;  . FRACTURE SURGERY Right 1972   leg  . IMPLANTABLE CARDIOVERTER DEFIBRILLATOR GENERATOR CHANGE  2014  . INCISIONAL HERNIA REPAIR N/A 04/10/2017   Procedure: OPEN REPAIR INCISIONAL VENTRAL HERNIA;  Surgeon: [name], MD;  Location: [place];  Service: General;  Laterality: N/A;  . INSERT / REPLACE / REMOVE PACEMAKER     ICD  . INSERTION OF MESH N/A 04/10/2017   Procedure: INSERTION OF MESH;  Surgeon: [name], MD;  Location: [place];  Service: General;  Laterality: N/A;  . LAPAROTOMY N/A 02/22/2016   Procedure: EXPLORATORY LAPAROTOMY WITH  PATCH OF DUODENAL ULCER;  Surgeon: Rolm Bookbinder, MD;  Location: [place];  Service: General;  Laterality: N/A;  . MULTIPLE TOOTH EXTRACTIONS    . TRANSTHORACIC ECHOCARDIOGRAM  12/2010   EF 30-35%  . TRANSURETHRAL RESECTION OF PROSTATE N/A 01/25/2016   Procedure: TRANSURETHRAL RESECTION OF THE PROSTATE (TURP);  Surgeon: Carolan Clines, MD;  Location: [facility] ORS;  Service: Urology;  Laterality: N/A;  . WRIST FRACTURE SURGERY Left 1972    Current Medications: Outpatient Medications Prior to Visit  Medication Sig Dispense Refill  . acetaminophen (TYLENOL) 500 MG tablet Take 500 mg by mouth every 8 (eight) hours as needed for mild pain.    Marland Kitchen digoxin (LANOXIN) 0.125 MG tablet TAKE ONE-HALF TABLET BY  MOUTH DAILY 45 tablet 8  . digoxin (LANOXIN) 0.125 MG tablet Take 0.5 tablets (0.0625 mg total) by mouth daily. 15 tablet 0  . fenofibrate 160 MG tablet TAKE 1  TABLET BY MOUTH  DAILY 90 tablet 3  . ferrous sulfate 325 (65 FE) MG tablet Take 325 mg by mouth 2 (two) times daily.    . folic acid (FOLVITE) 295 MCG tablet Take 800 mcg by mouth daily.     . furosemide (LASIX) 40 MG tablet Take 40 mg by mouth as directed. Take 1 tablet by mouth in the morning an 1/2 tablet in the afternoon    . losartan (COZAAR) 25 MG tablet TAKE 1 TABLET BY MOUTH  DAILY 90 tablet 3  . metoprolol succinate (TOPROL-XL) 25 MG 24 hr tablet Take 1 tablet (25 mg total) by mouth daily at 12 noon. 90 tablet 0  . Multiple Vitamins-Minerals (MULTIVITAMIN ADULTS PO) Take 1 tablet by mouth daily.    . nitroGLYCERIN (NITROSTAT) 0.4 MG SL tablet Place 1 tablet (0.4 mg total) under the tongue every 5 (five) minutes as needed for chest pain. 25 tablet 3  . pantoprazole (PROTONIX) 40 MG tablet TAKE 2 TABLETS BY  MOUTH  DAILY 180 tablet 3  . potassium chloride SA (K-DUR,KLOR-CON) 20 MEQ tablet TAKE 1 TABLET BY MOUTH  DAILY. TAKE ADDITIONAL  TABLET WHEN TAKING EXTRA  LASIX 135 tablet 0  . pravastatin (PRAVACHOL) 40 MG tablet TAKE 1 TABLET BY MOUTH  DAILY 90 tablet 3  . sertraline (ZOLOFT) 100 MG tablet Take 100 mg by mouth daily.      . vitamin C (ASCORBIC ACID) 250 MG tablet Take 250 mg by mouth 2 (two) times daily.    Marland Kitchen warfarin (COUMADIN) 5 MG tablet Take 0.5 tablets (2.5 mg total) by mouth daily at 6 PM. (Patient taking differently: Take 1 mg by mouth daily at 6 PM. ) 30 tablet 0   No facility-administered medications prior to visit.      Allergies:   Keflex [cephalexin]   Social History   Socioeconomic History  . Marital status: Married    Spouse name: Not on file  . Number of children: 2  . Years of education: Not on file  . Highest education level: Not on file  Occupational History  . Occupation: disabled    Employer: RETIRED  Social Needs  . Financial resource strain: Not on file  . Food insecurity:    Worry: Not on file    Inability: Not on file  . Transportation needs:      Medical: Not on file    Non-medical: Not on file  Tobacco Use  . Smoking status: Former Smoker    Types: Cigarettes    Last attempt to quit: 12/23/1998    Years since quitting: 19.8  . Smokeless tobacco: Never Used  Substance and Sexual Activity  . Alcohol use: No  . Drug use: No  . Sexual activity: Not on file  Lifestyle  . Physical activity:    Days per week: Not on file    Minutes per session: Not on file  . Stress: Not on file  Relationships  . Social connections:    Talks on phone: Not on file    Gets together: Not on file    Attends religious service: Not on file    Active member of club or organization: Not on file    Attends meetings of clubs or organizations: Not on file    Relationship status: Not on file  Other Topics Concern  . Not on file  Social History Narrative   Lives in [place].  Retired Teacher, English as a foreign language           Family History:  The patient's family history includes Breast cancer in his mother and sister; Depression in his mother; Heart attack in his father.   ROS:   Please see the history of present illness.    ROS All other systems reviewed and are negative.   PHYSICAL EXAM:   VS:  There were no vitals taken for this visit.   GENERAL:  Well appearing overweight WM in NAD.  HEENT:  PERRL, EOMI, sclera are clear. Oropharynx is clear. NECK:  No jugular venous distention, carotid upstroke brisk and symmetric, no bruits, no thyromegaly or adenopathy LUNGS:  Clear to auscultation bilaterally CHEST:  Unremarkable HEART:  RRR,  PMI not displaced or sustained,S1 and S2 within normal limits, no S3, no S4: no clicks, no rubs, no murmurs ABD:  Soft, nontender. BS +, no masses or bruits. No hepatomegaly, no splenomegaly EXT:  2 + pulses throughout, 1+ edema, no cyanosis no clubbing. Compression hose in place. SKIN:  Warm and dry.  No rashes  NEURO:  Alert and oriented x 3. Cranial nerves II through XII intact. PSYCH:  Cognitively  intact        Wt Readings from Last 3 Encounters:  06/28/18 206 lb 6.4 oz (93.6 kg)  05/17/18 210 lb (95.3 kg)  05/02/18 220 lb 3.2 oz (99.9 kg)      Studies/Labs Reviewed:   EKG:  EKG is  ordered today.  It shows Biventricular pacing at a rate of 73 bpm. I have personally reviewed and interpreted this study.   Recent Labs: 05/17/2018: BNP 268.9 07/12/2018: BUN 30; Creatinine, Ser 1.64; Potassium 4.1; Sodium 144   Lipid Panel    Component Value Date/Time   CHOL 112 02/27/2016 0500   TRIG 53 02/29/2016 0509   HDL 32 (L) 02/27/2016 0500   CHOLHDL 3.5 02/27/2016 0500   VLDL 14 02/27/2016 0500   LDLCALC 66 02/27/2016 0500   LDLDIRECT 137.6 01/06/2012 1029   Labs dated 05/01/18: BUN 41, creatinine 1.72. Other chemistries normal. Dig level 0.2. WBC 7.7. Hgb 11.8. Dated 10/08/18: INR 2.3. Hgb 11.8. BUN 32, creatinine 1.48. Other chemistries normal.   Additional studies/ records that were reviewed today include:   Echo 10/01/2017 LV EF: 25% -   30%  Study Conclusions  - Left ventricle: The cavity size was normal. Systolic function was   severely reduced. The estimated ejection fraction was in the   range of 25% to 30%. There is akinesis of the apical anterior,   lateral, inferolateral, inferior, and apical myocardium with no   evidence of apical thrombus by definity contrast. The study is   not technically sufficient to allow evaluation of LV diastolic   function. Doppler parameters are consistent with high ventricular   filling pressure. - Mitral valve: There was mild regurgitation. - Left atrium: The atrium was mildly dilated. - Tricuspid valve: There was mild-moderate regurgitation. - Pulmonic valve: There was trivial regurgitation. - Pulmonary arteries: PA peak pressure: 37 mm Hg (S).  Impressions:  - The right ventricular systolic pressure was increased consistent   with mild pulmonary hypertension.   ASSESSMENT:    No diagnosis found.   PLAN:  In  order of problems listed above:  1. Chronic systolic heart failure: weight is up about 10 lbs. 1+ edema. Lungs are clear. Will increase lasix to 40 mg bid.  Instructed on importance of sodium and fluid restriction.   2. ICM s/p ICD:   Device check in April was good with BiV pacing. Plan to recheck end of this month.  3. CAD: Denies any anginal symptoms.  4. Hyperlipidemia: On Pravachol 40 mg daily.  5. Persistent atrial fibrillation: He had AV nodal ablation in early 2018.  Continue Coumadin. Last INR 2.3.   6. CKD stage 3.     Medication Adjustments/Labs and Tests Ordered: Current medicines are reviewed at length with the patient today.  Concerns regarding medicines are outlined above.  Medication changes, Labs and Tests ordered today are listed in the Patient Instructions below. There are no Patient Instructions on file for this visit.   Signed, Vessie Olmsted Martinique, MD  10/11/2018 7:31 AM    [facility] [address], [place], [place]  [address] Phone: [phone]; Fax: [phone]

## 2018-10-12 ENCOUNTER — Encounter: Payer: Self-pay | Admitting: Cardiology

## 2018-10-12 ENCOUNTER — Ambulatory Visit: Payer: Medicare Other | Admitting: Cardiology

## 2018-10-12 VITALS — BP 120/58 | HR 73 | Ht 70.0 in | Wt 215.0 lb

## 2018-10-12 DIAGNOSIS — Z720 Tobacco use: Secondary | ICD-10-CM

## 2018-10-12 DIAGNOSIS — I2510 Atherosclerotic heart disease of native coronary artery without angina pectoris: Secondary | ICD-10-CM | POA: Diagnosis not present

## 2018-10-12 DIAGNOSIS — Z95810 Presence of automatic (implantable) cardiac defibrillator: Secondary | ICD-10-CM

## 2018-10-12 DIAGNOSIS — I5023 Acute on chronic systolic (congestive) heart failure: Secondary | ICD-10-CM

## 2018-10-12 DIAGNOSIS — I10 Essential (primary) hypertension: Secondary | ICD-10-CM

## 2018-10-12 NOTE — Patient Instructions (Signed)
Increase lasix to 40 mg twice a day

## 2018-10-17 ENCOUNTER — Other Ambulatory Visit: Payer: Self-pay | Admitting: Cardiology

## 2018-10-23 ENCOUNTER — Other Ambulatory Visit: Payer: Self-pay | Admitting: Cardiology

## 2018-10-23 DIAGNOSIS — I5022 Chronic systolic (congestive) heart failure: Secondary | ICD-10-CM

## 2018-10-25 ENCOUNTER — Ambulatory Visit (INDEPENDENT_AMBULATORY_CARE_PROVIDER_SITE_OTHER): Payer: Medicare Other

## 2018-10-25 ENCOUNTER — Ambulatory Visit (INDEPENDENT_AMBULATORY_CARE_PROVIDER_SITE_OTHER): Payer: Medicare Other | Admitting: *Deleted

## 2018-10-25 DIAGNOSIS — Z95810 Presence of automatic (implantable) cardiac defibrillator: Secondary | ICD-10-CM | POA: Diagnosis not present

## 2018-10-25 DIAGNOSIS — I255 Ischemic cardiomyopathy: Secondary | ICD-10-CM | POA: Diagnosis not present

## 2018-10-25 DIAGNOSIS — I5022 Chronic systolic (congestive) heart failure: Secondary | ICD-10-CM

## 2018-10-25 NOTE — Progress Notes (Signed)
Remote ICD transmission.   

## 2018-10-26 ENCOUNTER — Telehealth: Payer: Self-pay

## 2018-10-26 NOTE — Telephone Encounter (Signed)
Remote ICM transmission received.  Attempted call to patient regarding ICM remote transmission and no answer or voice mail. ° °

## 2018-10-26 NOTE — Progress Notes (Signed)
EPIC Encounter for ICM Monitoring  [name] is a 72 y.o. male Date: 10/26/2018 Primary Care Physican: [name], MD Primary [place] Electrophysiologist: Vergie Living Pacing: 97.2%   Last Weight: 211.6lbs  Today's Weight: unknown       Attempted call to patient and unable to reach.    Transmission reviewed.    Thoracic impedance close to baseline normal.   Prescribed: Furosemide40 mgtake 1 tablet two times a day.Potassium 20 mEq 1 tablet daily andTake additional tablet when taking extra lasix  Labs: 07/12/2018 Creatinine 1.64, BUN 30, Potassium 4.1, Sodium 144, EGFR 42-48 05/17/2018 Creatinine 1.65, BUN 31, Potassium 4.4, Sodium 146, EGFR 41-48, BNP 268.9  Recommendations: Unable to reach.  Follow-up plan: ICM clinic phone appointment on 11/26/2018.   Office appointment scheduled 12/13/2018 with Dr. Caryl Comes.    Copy of ICM check sent to Dr. Caryl Comes.   3 month ICM trend: 10/25/2018    1 Year ICM trend:       [name], RN 10/26/2018 8:27 AM

## 2018-10-28 ENCOUNTER — Encounter: Payer: Self-pay | Admitting: Cardiology

## 2018-10-30 ENCOUNTER — Encounter: Payer: Self-pay | Admitting: Internal Medicine

## 2018-11-26 ENCOUNTER — Ambulatory Visit (INDEPENDENT_AMBULATORY_CARE_PROVIDER_SITE_OTHER): Payer: Medicare Other

## 2018-11-26 DIAGNOSIS — Z95810 Presence of automatic (implantable) cardiac defibrillator: Secondary | ICD-10-CM | POA: Diagnosis not present

## 2018-11-26 DIAGNOSIS — I5022 Chronic systolic (congestive) heart failure: Secondary | ICD-10-CM

## 2018-11-27 ENCOUNTER — Telehealth: Payer: Self-pay | Admitting: Internal Medicine

## 2018-11-27 NOTE — Progress Notes (Signed)
EPIC Encounter for ICM Monitoring  [name] is a 72 y.o. male Date: 11/27/2018 Primary Care Physican: [name], MD Primary [place] Electrophysiologist: Vergie Living Pacing: 97.6% Last Weight: 207.8lbs  Today's Weight: 217 lbs       Heart Failure questions reviewed, pt symptomatic with swelling in calves and weight gain of 7-8 pounds since 11/15/2018.  He did miss an afternoon Furosemide dose x 1 this week.    Thoracic impedance normal but does have a slight decrease impedance starting 11/26/2018 which could be start of fluid accumulation..   Prescribed: Furosemide 40 mg take 1 tablet twice a day.  Recommendations:   Advised will send copy to Dr Caryl Comes and Dr Martinique.  Patient called to Dr Doug Sou office today to report symptoms and they will be making recommendations from Dr Doug Sou office.  Follow-up plan: ICM clinic phone appointment on 01/14/2019 since patient has defib office appointment scheduled 12/13/2018 with Dr. Claiborne Billings.    Copy of ICM check sent to Dr. Martinique and Dr Caryl Comes.   3 month ICM trend: 11/26/2018    1 Year ICM trend:       [name], RN 11/27/2018 10:57 AM

## 2018-11-27 NOTE — Telephone Encounter (Signed)
Attempted to call patient. Phone rang continuously with no answer

## 2018-11-27 NOTE — Telephone Encounter (Signed)
Per Margarita Grizzle @ 11:18am today  Optivol device reading today  Received: Today  Message Contents  Short, Laurie Panda, RN  P Cv Div Nl Triage        Hi   I saw the phone note that patient called the [facility] office today. I checked his fluid levels from the device report that was received yesterday and does not really correlate with symptoms but as of yesterday there may be the start of some fluid accumulation. I would have expected to see the impedance be more abnormal given his symptoms. I CC'D my note to Dr Martinique a few minutes ago. Just thought the report might be helpful for any recommendations that are needed.    Thanks,  Sharman Cheek

## 2018-11-27 NOTE — Telephone Encounter (Signed)
Called the pt and he reports that he had missed his doses of lasix yesterday and he was back to taking them regularly today.[name] He says that he is feeling well and only mild edema but notices that it is worse when he is on his feet al day but trying to elevate them at night and he is wearing his compression stockings..he says that he is watching his diet but hasn't been as cautious lately and will work on improving it.. Pt says he will let us know if he feels his fluid is getting out of control well before is gets bad.. But for now he is going to take his meds as he is supposed to but will call if anything worsens or changes.

## 2018-11-27 NOTE — Telephone Encounter (Signed)
Pt c/o swelling: STAT is pt has developed SOB within 24 hours  1) How much weight have you gained and in what time span? 7.9 lbs in 4 days   2) If swelling, where is the swelling located? Hands and Feet 3) Are you currently taking a fluid pill? Yes- Furosemide  4) Are you currently SOB?- a little  5) Do you have a log of your daily weights (if so, list)? yes  6) Have you gained 3 pounds in a day or 5 pounds in a week? 5 lbs in a week  7) Have you traveled recently? no

## 2018-11-27 NOTE — Progress Notes (Signed)
He should increase lasix to 80 mg bid for 3 days then 40 mg bid. Restrict salt intake.    Peter Martinique MD, [facility]   Recommendations called to patient by [facility] office.

## 2018-11-27 NOTE — Telephone Encounter (Signed)
He should increase lasix to 80 mg bid for 3 days then 40 mg bid. Restrict salt intake.  Peter Martinique MD, [facility]

## 2018-11-28 NOTE — Telephone Encounter (Signed)
Spoke to patient Dr.Jordan's recommendation given.Advised to call back if swelling not better.

## 2018-12-03 ENCOUNTER — Telehealth: Payer: Self-pay | Admitting: Cardiology

## 2018-12-03 NOTE — Telephone Encounter (Signed)
Spoke to [place] with [facility] she wanted Dr.Jordan to know patient increased Kdur 20 meq to 1&1/2 tablets daily. Spoke to patient advised Dr.Jordan wants him to continue lasix 80 mg in am and 40 mg in pm.Stated he increased Kdur to 20 meq 1&1/2 tablets daily for 1 week.Advised to take 20 meq daily.Stated he wanted to know how long to continue increased dose of lasix. I will speak to Dr.Jordan tomorrow 12/17 and call you back.

## 2018-12-03 NOTE — Telephone Encounter (Signed)
New message   Pt c/o Shortness Of Breath: STAT if SOB developed within the last 24 hours or pt is noticeably SOB on the phone  1. Are you currently SOB (can you hear that pt is SOB on the phone)? N/A,  Speaking to Elmyra Ricks on the phone   2. How long have you been experiencing SOB? Did not tell Elmyra Ricks how long he has had sob.  3. Are you SOB when sitting or when up moving around? Moving around   4. Are you currently experiencing any other symptoms? Dry mouth, leg cramps, swelling in calves, She states that he did increase the potassium dose per Elmyra Ricks.

## 2018-12-03 NOTE — Telephone Encounter (Signed)
Spoke with patient and his weight 11/27/18 was 217 today it is down to 211. He continues to have swelling in his calves only that does go down at night, no pain. Denies swelling in his feet or hands. He did increase his Lasix to 80 mg twice a day for 3 days as recommended. He also increased his Potassium by 20 meq daily while taking extra Lasix. He is back on his Lasix 40 mg twice a day as before. No cramping in 2 days. Patiens breathing is at his baseline. He concerned since his device was checked for fluid last week and told ok but continues to have swelling in calves only. Will forward to Dr Martinique for review.

## 2018-12-03 NOTE — Telephone Encounter (Signed)
His dry weight is really about 200-205. I would continue lasix 80 mg in the am and 40 in the pm. The biggest issue for him is sodium restriction.   Peter Martinique MD, [facility]

## 2018-12-05 ENCOUNTER — Telehealth: Payer: Self-pay | Admitting: Cardiology

## 2018-12-05 DIAGNOSIS — I10 Essential (primary) hypertension: Secondary | ICD-10-CM

## 2018-12-05 DIAGNOSIS — I2510 Atherosclerotic heart disease of native coronary artery without angina pectoris: Secondary | ICD-10-CM

## 2018-12-05 DIAGNOSIS — Z9861 Coronary angioplasty status: Secondary | ICD-10-CM

## 2018-12-05 DIAGNOSIS — I480 Paroxysmal atrial fibrillation: Secondary | ICD-10-CM

## 2018-12-05 MED ORDER — FUROSEMIDE 40 MG PO TABS: ORAL_TABLET | ORAL | 3 refills | Status: DC

## 2018-12-05 NOTE — Telephone Encounter (Signed)
Spoke to patient Dr.Jordan advised to continue Lasix 80 mg in am and 40 mg in pm.New prescription sent to [facility].Advised he needs to have bmet.Stated he will have done tomorrow 12/07/18.

## 2018-12-05 NOTE — Telephone Encounter (Signed)
New message   Pt c/o medication issue:  1. Name of Medication: furosemide (LASIX) 40 MG tablet  2. How are you currently taking this medication (dosage and times per day)? 3 times daily  3. Are you having a reaction (difficulty breathing--STAT)? no  4. What is your medication issue? Patient wants to know how long will need to take 3 pills daily? Please advise.

## 2018-12-06 LAB — BASIC METABOLIC PANEL
BUN/Creatinine Ratio: 17 (ref 10–24)
BUN: 30 mg/dL — ABNORMAL HIGH (ref 8–27)
CO2: 28 mmol/L (ref 20–29)
CREATININE: 1.79 mg/dL — AB (ref 0.76–1.27)
Calcium: 9.2 mg/dL (ref 8.6–10.2)
Chloride: 102 mmol/L (ref 96–106)
GFR calc non Af Amer: 37 mL/min/{1.73_m2} — ABNORMAL LOW (ref >59)
GFR, EST AFRICAN AMERICAN: 43 mL/min/{1.73_m2} — AB (ref >59)
Glucose: 90 mg/dL (ref 65–99)
POTASSIUM: 3.7 mmol/L (ref 3.5–5.2)
SODIUM: 146 mmol/L — AB (ref 134–144)

## 2018-12-10 ENCOUNTER — Other Ambulatory Visit: Payer: Self-pay | Admitting: *Deleted

## 2018-12-10 MED ORDER — FUROSEMIDE 40 MG PO TABS: ORAL_TABLET | ORAL | 3 refills | Status: DC

## 2018-12-13 ENCOUNTER — Ambulatory Visit: Payer: Medicare Other | Admitting: Internal Medicine

## 2018-12-13 ENCOUNTER — Encounter: Payer: Self-pay | Admitting: Internal Medicine

## 2018-12-13 VITALS — BP 116/62 | HR 80 | Ht 70.0 in | Wt 219.4 lb

## 2018-12-13 DIAGNOSIS — I5022 Chronic systolic (congestive) heart failure: Secondary | ICD-10-CM | POA: Diagnosis not present

## 2018-12-13 DIAGNOSIS — I482 Chronic atrial fibrillation, unspecified: Secondary | ICD-10-CM | POA: Diagnosis not present

## 2018-12-13 DIAGNOSIS — Z95810 Presence of automatic (implantable) cardiac defibrillator: Secondary | ICD-10-CM | POA: Diagnosis not present

## 2018-12-13 DIAGNOSIS — I255 Ischemic cardiomyopathy: Secondary | ICD-10-CM

## 2018-12-13 LAB — CBC WITH DIFFERENTIAL/PLATELET
BASOS: 1 %
Basophils Absolute: 0.1 10*3/uL (ref 0.0–0.2)
EOS (ABSOLUTE): 0.1 10*3/uL (ref 0.0–0.4)
Eos: 1 %
Hematocrit: 36.2 % — ABNORMAL LOW (ref 37.5–51.0)
Hemoglobin: 12 g/dL — ABNORMAL LOW (ref 13.0–17.7)
IMMATURE GRANULOCYTES: 0 %
Immature Grans (Abs): 0 10*3/uL (ref 0.0–0.1)
Lymphocytes Absolute: 1.7 10*3/uL (ref 0.7–3.1)
Lymphs: 22 %
MCH: 27.2 pg (ref 26.6–33.0)
MCHC: 33.1 g/dL (ref 31.5–35.7)
MCV: 82 fL (ref 79–97)
Monocytes Absolute: 0.6 10*3/uL (ref 0.1–0.9)
Monocytes: 8 %
Neutrophils Absolute: 5.2 10*3/uL (ref 1.4–7.0)
Neutrophils: 68 %
Platelets: 271 10*3/uL (ref 150–450)
RBC: 4.41 x10E6/uL (ref 4.14–5.80)
RDW: 13.2 % (ref 12.3–15.4)
WBC: 7.6 10*3/uL (ref 3.4–10.8)

## 2018-12-13 NOTE — Progress Notes (Signed)
Patient Care Team: [name], MD as PCP - General (Family Medicine) Martinique, Peter M, MD as PCP - Cardiology (Cardiology) [name], MD as PCP - Electrophysiology (Cardiology) [name], MD as Consulting Physician (Cardiology)   HPI  [name] is a 72 y.o. male Seen in followup of afib for which he recently saw Dr Greggory Brandy and the was decision to not pursue P VI He also has ichemic cardiomyopathy and HFrEF with prior CRT-D from which he garnered little benefit. CXR demonstrates good lead location  We had spent many months deciding what to do about rate control and finally underwent AV junction ablation 2/18   Hospitalized 3/17 for GI bleed 2/2 ulcers requring surgery for perforation  now back on warfain  Recently underwent incisional hernia repair   Which is slowly healing  DATE TEST EF   1/17    Echo  20.25 %   10/18 Echo  25-30%      Date Cr K Dig   4/18 1.43 3.4    11/18 1.42 4.6    12/19 1.79 3.7  12  He has moved to [place].  Less shortness of breath.  No chest pain.  No edema.     Past Medical History:  Diagnosis Date  . AICD (automatic cardioverter/defibrillator) present   . Anemia   . Anxiety   . CHF (congestive heart failure) ([place])   . Chronic renal insufficiency   . Coronary artery disease   . Depression   . Dyslipidemia   . Dysrhythmia    atrial fibrillation  . ED (erectile dysfunction)   . GERD (gastroesophageal reflux disease)   . Incisional hernia, without obstruction or gangrene 04/10/2017  . Ischemic cardiomyopathy    EF 23%  . MI (myocardial infarction) ([place]) 2000   ANTERIOR, s/p PCI  . Other primary cardiomyopathies   . Peripheral vascular disease ([place])    dvt legs 2017  . Persistent atrial fibrillation    on coumadin  . Presence of permanent cardiac pacemaker   . Shortness of breath dyspnea    on exertion    Past Surgical History:  Procedure Laterality Date  . AV NODE ABLATION N/A 01/25/2017   Procedure:  AV Node Ablation;  Surgeon: [name], MD;  Location: [place] [facility];  Service: Cardiovascular;  Laterality: N/A;  . AV NODE ABLATION  01/25/2017  . BIV ICD GENERTAOR CHANGE OUT N/A 09/16/2013   Procedure: BIV ICD GENERTAOR CHANGE OUT;  Surgeon: [name], MD;  Location[facility] CATH LAB;  Service: Cardiovascular;  Laterality: N/A;  . BREAST LUMPECTOMY Left   . CARDIAC CATHETERIZATION  02/05/2008   MODERATE TO SEVERE LEFT VENTRICULAR  DYSFUNCTION WITH EF 25-30%  . CARDIAC DEFIBRILLATOR PLACEMENT  2009   MDT BI-VENTRICULAR ICD by Dr Caryl Comes  . COLONOSCOPY WITH PROPOFOL N/A 08/31/2016   Procedure: COLONOSCOPY WITH PROPOFOL;  Surgeon: Wilford Corner, MD;  Location: [facility] ENDOSCOPY;  Service: Endoscopy;  Laterality: N/A;  . CORONARY ANGIOPLASTY  2000   stent  . CORONARY STENT PLACEMENT     LAD  . CYSTOSCOPY N/A 01/25/2016   Procedure: CYSTOSCOPY;  Surgeon: Carolan Clines, MD;  [facility]: [facility];  Service: Urology;  Laterality: N/A;  . ESOPHAGOGASTRODUODENOSCOPY (EGD) WITH PROPOFOL N/A 08/31/2016   Procedure: ESOPHAGOGASTRODUODENOSCOPY (EGD) WITH PROPOFOL;  Surgeon: Wilford Corner, MD;  Location: [facility] ENDOSCOPY;  Service: Endoscopy;  Laterality: N/A;  . FRACTURE SURGERY Right 1972   leg  . IMPLANTABLE CARDIOVERTER  DEFIBRILLATOR GENERATOR CHANGE  2014  . INCISIONAL HERNIA REPAIR N/A 04/10/2017   Procedure: OPEN REPAIR INCISIONAL VENTRAL HERNIA;  Surgeon: [name], MD;  Location: [place];  Service: General;  Laterality: N/A;  . INSERT / REPLACE / REMOVE PACEMAKER     ICD  . INSERTION OF MESH N/A 04/10/2017   Procedure: INSERTION OF MESH;  Surgeon: [name], MD;  Location: [place];  Service: General;  Laterality: N/A;  . LAPAROTOMY N/A 02/22/2016   Procedure: EXPLORATORY LAPAROTOMY WITH  PATCH OF DUODENAL ULCER;  Surgeon: Rolm Bookbinder, MD;  Location: [place];  Service: General;  Laterality: N/A;  . MULTIPLE TOOTH EXTRACTIONS    . TRANSTHORACIC ECHOCARDIOGRAM  12/2010   EF 30-35%  .  TRANSURETHRAL RESECTION OF PROSTATE N/A 01/25/2016   Procedure: TRANSURETHRAL RESECTION OF THE PROSTATE (TURP);  Surgeon: Carolan Clines, MD;  [facility]: [facility];  Service: Urology;  Laterality: N/A;  . WRIST FRACTURE SURGERY Left 1972    Current Outpatient Medications  Medication Sig Dispense Refill  . acetaminophen (TYLENOL) 500 MG tablet Take 500 mg by mouth every 8 (eight) hours as needed for mild pain.    Marland Kitchen digoxin (LANOXIN) 0.125 MG tablet Take 0.5 tablets (0.0625 mg total) by mouth daily. (Patient taking differently: Take 0.0625 mg by mouth every other day. ) 15 tablet 0  . fenofibrate 160 MG tablet TAKE 1 TABLET BY MOUTH  DAILY 90 tablet 0  . ferrous sulfate 325 (65 FE) MG tablet Take 325 mg by mouth 2 (two) times daily.    . folic acid (FOLVITE) 416 MCG tablet Take 800 mcg by mouth daily.     . furosemide (LASIX) 40 MG tablet Take 2 tablets ( 80 mg ) in am and 1 tablet ( 40 mg ) in pm. 270 tablet 3  . losartan (COZAAR) 25 MG tablet TAKE 1 TABLET BY MOUTH  DAILY 90 tablet 3  . metoprolol succinate (TOPROL-XL) 25 MG 24 hr tablet TAKE 1 TABLET BY MOUTH  DAILY AT 12 NOON. 90 tablet 1  . Multiple Vitamins-Minerals (MULTIVITAMIN ADULTS PO) Take 1 tablet by mouth daily.    . pantoprazole (PROTONIX) 40 MG tablet TAKE 2 TABLETS BY MOUTH  DAILY 180 tablet 3  . potassium chloride SA (K-DUR,KLOR-CON) 20 MEQ tablet TAKE 1 TABLET BY MOUTH  DAILY. TAKE ADDITIONAL  TABLET WHEN TAKING EXTRA  LASIX 135 tablet 0  . pravastatin (PRAVACHOL) 40 MG tablet TAKE 1 TABLET BY MOUTH  DAILY 90 tablet 3  . sertraline (ZOLOFT) 100 MG tablet Take 100 mg by mouth daily.      . vitamin C (ASCORBIC ACID) 250 MG tablet Take 250 mg by mouth 2 (two) times daily.    Marland Kitchen warfarin (COUMADIN) 5 MG tablet Take 0.5 tablets (2.5 mg total) by mouth daily at 6 PM. (Patient taking differently: Take 1 mg by mouth daily at 6 PM. ) 30 tablet 0  . nitroGLYCERIN (NITROSTAT) 0.4 MG SL tablet Place 1 tablet (0.4 mg total) under the tongue  every 5 (five) minutes as needed for chest pain. 25 tablet 3   No current facility-administered medications for this visit.     Allergies  Allergen Reactions  . Keflex [Cephalexin] Shortness Of Breath and Other (See Comments)    dizziness    Review of Systems negative except from HPI and PMH  Physical Exam BP 116/62   Pulse 80   Ht 5\' 10"  (1.778 m)   Wt 219 lb 6.4 oz (99.5 kg)  SpO2 97%   BMI 31.48 kg/m  Well developed and nourished in no acute distress HENT normal Neck supple with JVP-flat Clear Regular rate and rhythm, no murmurs or gallops Abd-soft with active BS No Clubbing cyanosis edema Skin-warm and dry A & Oriented  Grossly normal sensory and motor function   ECG atrial fibrillation with complete heart block and ventricular pacing  Assessment and  Plan  Atrial fibrillation  -permanent  Complete heart block status post AV ablation  Congestive heart failure-chronic-systolic  Ischemic cardiomyopathy  Renal insufficiency grade 3  CRT-D.  The patient's device was interrogated.  The information was reviewed. No changes were made in the programming.          Patient is euvolemic.  No chest pain.  No obvious bleeding.  Hemoglobin is stable  With increasing creatinine, we will need to check a dig level.

## 2018-12-13 NOTE — Patient Instructions (Addendum)
Medication Instructions:  Your physician recommends that you continue on your current medications as directed. Please refer to the Current Medication list given to you today.  Labwork: You will get lab work today:  CBC  Testing/Procedures: None ordered.  Follow-Up: Your physician wants you to follow-up in: one year with Dr. Caryl Comes EP APP.   You will receive a reminder letter in the mail two months in advance. If you don't receive a letter, please call our office to schedule the follow-up appointment.  Remote monitoring is used to monitor your ICD from home. This monitoring reduces the number of office visits required to check your device to one time per year. It allows [place] to keep an eye on the functioning of your device to ensure it is working properly. You are scheduled for a device check from home on 01/14/2019. You may send your transmission at any time that day. If you have a wireless device, the transmission will be sent automatically. After your physician reviews your transmission, you will receive a postcard with your next transmission date.  Any Other Special Instructions Will Be Listed Below (If Applicable).  If you need a refill on your cardiac medications before your next appointment, please call your pharmacy.

## 2018-12-14 LAB — CUP PACEART INCLINIC DEVICE CHECK
Battery Remaining Longevity: 12 mo
Battery Voltage: 2.88 V
Brady Statistic AP VP Percent: 0 %
Brady Statistic AP VS Percent: 0 %
Brady Statistic AS VP Percent: 98.56 %
Brady Statistic AS VS Percent: 1.44 %
Brady Statistic RA Percent Paced: 0 %
Brady Statistic RV Percent Paced: 97.8 %
Date Time Interrogation Session: 20191226175705
HighPow Impedance: 48 Ohm
HighPow Impedance: 65 Ohm
Implantable Lead Implant Date: 20090226
Implantable Lead Implant Date: 20090226
Implantable Lead Implant Date: 20090226
Implantable Lead Location: 753858
Implantable Lead Location: 753859
Implantable Lead Location: 753860
Implantable Lead Model: 4194
Implantable Lead Model: 5076
Implantable Lead Model: 6947
Lead Channel Impedance Value: 285 Ohm
Lead Channel Impedance Value: 342 Ohm
Lead Channel Impedance Value: 456 Ohm
Lead Channel Impedance Value: 551 Ohm
Lead Channel Impedance Value: 722 Ohm
Lead Channel Pacing Threshold Amplitude: 0.625 V
Lead Channel Pacing Threshold Pulse Width: 0.6 ms
Lead Channel Sensing Intrinsic Amplitude: 16.25 mV
Lead Channel Sensing Intrinsic Amplitude: 16.25 mV
Lead Channel Sensing Intrinsic Amplitude: 4 mV
Lead Channel Sensing Intrinsic Amplitude: 4.5 mV
Lead Channel Setting Pacing Amplitude: 1.75 V
Lead Channel Setting Pacing Amplitude: 2.5 V
Lead Channel Setting Pacing Pulse Width: 0.6 ms
Lead Channel Setting Pacing Pulse Width: 0.6 ms
Lead Channel Setting Sensing Sensitivity: 0.3 mV
MDC IDC MSMT LEADCHNL RV IMPEDANCE VALUE: 285 Ohm
MDC IDC PG IMPLANT DT: 20140929

## 2018-12-18 ENCOUNTER — Telehealth: Payer: Self-pay

## 2018-12-18 DIAGNOSIS — Z79899 Other long term (current) drug therapy: Principal | ICD-10-CM

## 2018-12-18 DIAGNOSIS — I5022 Chronic systolic (congestive) heart failure: Secondary | ICD-10-CM

## 2018-12-18 DIAGNOSIS — Z5181 Encounter for therapeutic drug level monitoring: Secondary | ICD-10-CM

## 2018-12-18 NOTE — Telephone Encounter (Signed)
Per Dr Caryl Comes, pt needs a digoxin level drawn. Pt agrees to come by the office on 1/2 for the lab draw.

## 2018-12-20 ENCOUNTER — Other Ambulatory Visit: Payer: Medicare Other | Admitting: *Deleted

## 2018-12-20 DIAGNOSIS — I5022 Chronic systolic (congestive) heart failure: Secondary | ICD-10-CM

## 2018-12-20 DIAGNOSIS — Z5181 Encounter for therapeutic drug level monitoring: Secondary | ICD-10-CM

## 2018-12-20 DIAGNOSIS — Z79899 Other long term (current) drug therapy: Principal | ICD-10-CM

## 2018-12-20 LAB — DIGOXIN LEVEL

## 2018-12-29 LAB — CUP PACEART REMOTE DEVICE CHECK
Battery Remaining Longevity: 13 mo
Battery Voltage: 2.9 V
Brady Statistic AP VS Percent: 0 %
Brady Statistic AS VP Percent: 97.93 %
Brady Statistic AS VS Percent: 2.07 %
Brady Statistic RA Percent Paced: 0 %
Brady Statistic RV Percent Paced: 97.23 %
Date Time Interrogation Session: 20191107093724
HighPow Impedance: 47 Ohm
HighPow Impedance: 52 Ohm
Implantable Lead Implant Date: 20090226
Implantable Lead Implant Date: 20090226
Implantable Lead Implant Date: 20090226
Implantable Lead Location: 753858
Implantable Lead Location: 753859
Implantable Lead Location: 753860
Implantable Lead Model: 4194
Implantable Lead Model: 5076
Implantable Lead Model: 6947
Implantable Pulse Generator Implant Date: 20140929
Lead Channel Impedance Value: 285 Ohm
Lead Channel Impedance Value: 285 Ohm
Lead Channel Impedance Value: 380 Ohm
Lead Channel Impedance Value: 456 Ohm
Lead Channel Impedance Value: 513 Ohm
Lead Channel Impedance Value: 703 Ohm
Lead Channel Pacing Threshold Amplitude: 0.625 V
Lead Channel Pacing Threshold Pulse Width: 0.6 ms
Lead Channel Sensing Intrinsic Amplitude: 16.25 mV
Lead Channel Sensing Intrinsic Amplitude: 16.25 mV
Lead Channel Sensing Intrinsic Amplitude: 3.875 mV
Lead Channel Sensing Intrinsic Amplitude: 3.875 mV
Lead Channel Setting Pacing Amplitude: 1.75 V
Lead Channel Setting Pacing Amplitude: 2.5 V
Lead Channel Setting Pacing Pulse Width: 0.6 ms
Lead Channel Setting Pacing Pulse Width: 0.6 ms
Lead Channel Setting Sensing Sensitivity: 0.3 mV
MDC IDC STAT BRADY AP VP PERCENT: 0 %

## 2019-01-14 ENCOUNTER — Ambulatory Visit (INDEPENDENT_AMBULATORY_CARE_PROVIDER_SITE_OTHER): Payer: Medicare Other

## 2019-01-14 DIAGNOSIS — I5022 Chronic systolic (congestive) heart failure: Secondary | ICD-10-CM | POA: Diagnosis not present

## 2019-01-14 DIAGNOSIS — Z95810 Presence of automatic (implantable) cardiac defibrillator: Secondary | ICD-10-CM

## 2019-01-15 NOTE — Progress Notes (Signed)
EPIC Encounter for ICM Monitoring  [name] is a 73 y.o. male Date: 01/15/2019 Primary Care Physican: [name], MD Primary [place] Electrophysiologist: Vergie Living Pacing: 94.8% Last Weight:217lbs                                                            Transmission reviewed.    Thoracic impedance normal.   Prescribed: Furosemide 40 mg Take 2 tablets ( 80 mg ) in am and 1 tablet ( 40 mg ) in pm.  Potassium 20 mEq TAKE 1 TABLET BY MOUTH DAILY. TAKE ADDITIONAL TABLET WHEN TAKING EXTRA LASIX  Labs: 12/06/2018 Creatinine 1.79, BUN 30, Potassium 3.7, Sodium 146, eGFR 37-43 07/12/2018 Creatinine 1.64, BUN 30, Potassium 4.1, Sodium 144, EGFR 42-48 05/17/2018 Creatinine 1.65, BUN 31, Potassium 4.4, Sodium 146, EGFR 41-48, BNP 268.9  Recommendations:  None  Follow-up plan: ICM clinic phone appointment on 02/18/2019    Office appointment scheduled 01/22/2019 with Dr. Martinique.    Copy of ICM check sent to Dr. Caryl Comes.   3 month ICM trend: 01/14/2019    1 Year ICM trend:       [name], RN 01/15/2019 5:26 PM

## 2019-01-20 NOTE — Progress Notes (Signed)
Cardiology Office Note    Date:  01/22/2019   ID:  [name], [name] [birth year], MRN [id number]  PCP:  [name], MD  Cardiologist:  Dr. Jamilynn Whitacre Martinique   Chief Complaint  Patient presents with  . Congestive Heart Failure    History of Present Illness:  [name] is a 73 y.o. male with PMH of CAD s/p remote anterior MI, ICM s/p CRT-D, atrial fibrillation, and HLD. He had generator change placed in September 2014. He is intolerant of amiodarone. His QT interval was too long for Tikosyn. He previously attempted to increase his carvedilol dose, however developed significant hypotension.   In October 2017, he had volume overloaded indicated by his Optival reading, Lasix was increased to 40 mg twice a day. He was started on Entresto but developed hypotension, he was later transitioned back to losartan. Due to persistent atrial fibrillation with elevated heart rate, he underwent AV nodal ablation in February 2018.  He was admitted to the hospital on 09/30/2017 with renal insufficiency. He reported episode of weakness. He was also treated for SIRS and sepsis with temp 102 and tachypnea. White blood cell count was 16.6. Initial lactic acid 2.14. He was hydrated with IV fluid. Troponin was borderline elevated, this was felt to be related to elevation of creatinine up to 2.71. He was treated with vancomycin, Levaquin and aztreonam. Culture was negative. Repeat echocardiogram obtained on 10/01/2017 continues showed EF 25-30%, mild MR, mild-to-moderate TR, akinesis of the apical anterior, lateral, inferolateral, inferior, apical myocardium with no evidence of apical thrombus by Definity contrast, PA peak pressure 37 mmHg.   Last ICD check in December was normal.   On follow up today he is doing well. He is settling into his new house in [place]. Denies any increase in edema or SOB. Weight does fluctuate depending on his diet but has been fairly stable. Coumadin checks have been ok.    Past Medical History:  Diagnosis Date  . AICD (automatic cardioverter/defibrillator) present   . Anemia   . Anxiety   . CHF (congestive heart failure) ([place])   . Chronic renal insufficiency   . Coronary artery disease   . Depression   . Dyslipidemia   . Dysrhythmia    atrial fibrillation  . ED (erectile dysfunction)   . GERD (gastroesophageal reflux disease)   . Incisional hernia, without obstruction or gangrene 04/10/2017  . Ischemic cardiomyopathy    EF 23%  . MI (myocardial infarction) ([place]) 2000   ANTERIOR, s/p PCI  . Other primary cardiomyopathies   . Peripheral vascular disease ([place])    dvt legs 2017  . Persistent atrial fibrillation    on coumadin  . Presence of permanent cardiac pacemaker   . Shortness of breath dyspnea    on exertion    Past Surgical History:  Procedure Laterality Date  . AV NODE ABLATION N/A 01/25/2017   Procedure: AV Node Ablation;  Surgeon: [name], MD;  Location: [place] [facility];  Service: Cardiovascular;  Laterality: N/A;  . AV NODE ABLATION  01/25/2017  . BIV ICD GENERTAOR CHANGE OUT N/A 09/16/2013   Procedure: BIV ICD GENERTAOR CHANGE OUT;  Surgeon: [name], MD;  Location: [facility] CATH LAB;  Service: Cardiovascular;  Laterality: N/A;  . BREAST LUMPECTOMY Left   . CARDIAC CATHETERIZATION  02/05/2008   MODERATE TO SEVERE LEFT VENTRICULAR  DYSFUNCTION WITH EF 25-30%  . CARDIAC DEFIBRILLATOR PLACEMENT  2009   MDT BI-VENTRICULAR ICD by Dr Caryl Comes  .  COLONOSCOPY WITH PROPOFOL N/A 08/31/2016   Procedure: COLONOSCOPY WITH PROPOFOL;  Surgeon: Wilford Corner, MD;  [facility] ENDOSCOPY;  Service: Endoscopy;  Laterality: N/A;  . CORONARY ANGIOPLASTY  2000   stent  . CORONARY STENT PLACEMENT     LAD  . CYSTOSCOPY N/A 01/25/2016   Procedure: CYSTOSCOPY;  Surgeon: Carolan Clines, MD;  [facility];  Service: Urology;  Laterality: N/A;  . ESOPHAGOGASTRODUODENOSCOPY (EGD) WITH PROPOFOL N/A 08/31/2016   Procedure:  ESOPHAGOGASTRODUODENOSCOPY (EGD) WITH PROPOFOL;  Surgeon: Wilford Corner, MD;  Location: [facility] ENDOSCOPY;  Service: Endoscopy;  Laterality: N/A;  . FRACTURE SURGERY Right 1972   leg  . IMPLANTABLE CARDIOVERTER DEFIBRILLATOR GENERATOR CHANGE  2014  . INCISIONAL HERNIA REPAIR N/A 04/10/2017   Procedure: OPEN REPAIR INCISIONAL VENTRAL HERNIA;  Surgeon: [name], MD;  Location: [facility];  Service: General;  Laterality: N/A;  . INSERT / REPLACE / REMOVE PACEMAKER     ICD  . INSERTION OF MESH N/A 04/10/2017   Procedure: INSERTION OF MESH;  Surgeon: [name], MD;  Location: [place];  Service: General;  Laterality: N/A;  . LAPAROTOMY N/A 02/22/2016   Procedure: EXPLORATORY LAPAROTOMY WITH  PATCH OF DUODENAL ULCER;  Surgeon: Rolm Bookbinder, MD;  Location: [place];  Service: General;  Laterality: N/A;  . MULTIPLE TOOTH EXTRACTIONS    . TRANSTHORACIC ECHOCARDIOGRAM  12/2010   EF 30-35%  . TRANSURETHRAL RESECTION OF PROSTATE N/A 01/25/2016   Procedure: TRANSURETHRAL RESECTION OF THE PROSTATE (TURP);  Surgeon: Carolan Clines, MD;  Location: [facility];  Service: Urology;  Laterality: N/A;  . WRIST FRACTURE SURGERY Left 1972    Current Medications: Outpatient Medications Prior to Visit  Medication Sig Dispense Refill  . acetaminophen (TYLENOL) 500 MG tablet Take 500 mg by mouth every 8 (eight) hours as needed for mild pain.    Marland Kitchen digoxin (LANOXIN) 0.125 MG tablet Take 1/2 tablet every other day 90 tablet 3  . fenofibrate 160 MG tablet TAKE 1 TABLET BY MOUTH  DAILY 90 tablet 0  . ferrous sulfate 325 (65 FE) MG tablet Take 325 mg by mouth 2 (two) times daily.    . folic acid (FOLVITE) 275 MCG tablet Take 800 mcg by mouth daily.     . furosemide (LASIX) 40 MG tablet Take 2 tablets ( 80 mg ) in am and 1 tablet ( 40 mg ) in pm. 270 tablet 3  . losartan (COZAAR) 25 MG tablet TAKE 1 TABLET BY MOUTH  DAILY 90 tablet 3  . metoprolol succinate (TOPROL-XL) 25 MG 24 hr tablet TAKE 1 TABLET BY MOUTH  DAILY AT 12  NOON. 90 tablet 1  . Multiple Vitamins-Minerals (MULTIVITAMIN ADULTS PO) Take 1 tablet by mouth daily.    . pantoprazole (PROTONIX) 40 MG tablet TAKE 2 TABLETS BY MOUTH  DAILY 180 tablet 3  . potassium chloride SA (K-DUR,KLOR-CON) 20 MEQ tablet TAKE 1 TABLET BY MOUTH  DAILY. TAKE ADDITIONAL  TABLET WHEN TAKING EXTRA  LASIX 135 tablet 0  . pravastatin (PRAVACHOL) 40 MG tablet TAKE 1 TABLET BY MOUTH  DAILY 90 tablet 3  . sertraline (ZOLOFT) 100 MG tablet Take 100 mg by mouth daily.      . vitamin C (ASCORBIC ACID) 250 MG tablet Take 250 mg by mouth 2 (two) times daily.    Marland Kitchen warfarin (COUMADIN) 5 MG tablet Take 0.5 tablets (2.5 mg total) by mouth daily at 6 PM. (Patient taking differently: Take 1 mg by mouth daily at 6 PM. ) 30 tablet  0  . digoxin (LANOXIN) 0.125 MG tablet Take 0.5 tablets (0.0625 mg total) by mouth daily. (Patient taking differently: Take 0.0625 mg by mouth every other day. ) 15 tablet 0  . nitroGLYCERIN (NITROSTAT) 0.4 MG SL tablet Place 1 tablet (0.4 mg total) under the tongue every 5 (five) minutes as needed for chest pain. 25 tablet 3   No facility-administered medications prior to visit.      Allergies:   Keflex [cephalexin]   Social History   Socioeconomic History  . Marital status: Married    Spouse name: Not on file  . Number of children: 2  . Years of education: Not on file  . Highest education level: Not on file  Occupational History  . Occupation: disabled    Employer: RETIRED  Social Needs  . Financial resource strain: Not on file  . Food insecurity:    Worry: Not on file    Inability: Not on file  . Transportation needs:    Medical: Not on file    Non-medical: Not on file  Tobacco Use  . Smoking status: Former Smoker    Types: Cigarettes    Last attempt to quit: 12/23/1998    Years since quitting: 20.0  . Smokeless tobacco: Never Used  Substance and Sexual Activity  . Alcohol use: No  . Drug use: No  . Sexual activity: Not on file  Lifestyle  .  Physical activity:    Days per week: Not on file    Minutes per session: Not on file  . Stress: Not on file  Relationships  . Social connections:    Talks on phone: Not on file    Gets together: Not on file    Attends religious service: Not on file    Active member of club or organization: Not on file    Attends meetings of clubs or organizations: Not on file    Relationship status: Not on file  Other Topics Concern  . Not on file  Social History Narrative   Lives in [place].  Retired Teacher, English as a foreign language           Family History:  The patient's family history includes Breast cancer in his mother and sister; Depression in his mother; Heart attack in his father.   ROS:   Please see the history of present illness.    ROS All other systems reviewed and are negative.   PHYSICAL EXAM:   VS:  BP 112/70   Pulse 81   Ht 5\' 10"  (1.778 m)   Wt 216 lb 6 oz (98.1 kg)   BMI 31.05 kg/m    GENERAL:  Well appearing overweight WM in NAD.  HEENT:  PERRL, EOMI, sclera are clear. Oropharynx is clear. NECK:  No jugular venous distention, carotid upstroke brisk and symmetric, no bruits, no thyromegaly or adenopathy LUNGS:  Clear to auscultation bilaterally CHEST:  Unremarkable HEART:  RRR,  PMI not displaced or sustained,S1 and S2 within normal limits, no S3, no S4: no clicks, no rubs, no murmurs ABD:  Soft, nontender. BS +, no masses or bruits. No hepatomegaly, no splenomegaly EXT:  2 + pulses throughout, tr edema, no cyanosis no clubbing. Compression hose in place. SKIN:  Warm and dry.  No rashes NEURO:  Alert and oriented x 3. Cranial nerves II through XII intact. PSYCH:  Cognitively intact   Wt Readings from Last 3 Encounters:  01/22/19 216 lb 6 oz (98.1 kg)  12/13/18 219 lb 6.4 oz (99.5 kg)  10/12/18  215 lb (97.5 kg)      Studies/Labs Reviewed:   EKG:  EKG is not ordered today.     Recent Labs: 05/17/2018: BNP 268.9 12/06/2018: BUN 30; Creatinine, Ser 1.79; Potassium 3.7;  Sodium 146 12/13/2018: Hemoglobin 12.0; Platelets 271   Lipid Panel    Component Value Date/Time   CHOL 112 02/27/2016 0500   TRIG 53 02/29/2016 0509   HDL 32 (L) 02/27/2016 0500   CHOLHDL 3.5 02/27/2016 0500   VLDL 14 02/27/2016 0500   LDLCALC 66 02/27/2016 0500   LDLDIRECT 137.6 01/06/2012 1029   Labs dated 05/01/18: BUN 41, creatinine 1.72. Other chemistries normal. Dig level 0.2. WBC 7.7. Hgb 11.8. Dated 10/08/18: INR 2.3. Hgb 11.8. BUN 32, creatinine 1.48. Other chemistries normal.  Dated 12/20/18: Dig level < 0.4.  Additional studies/ records that were reviewed today include:   Echo 10/01/2017 LV EF: 25% -   30%  Study Conclusions  - Left ventricle: The cavity size was normal. Systolic function was   severely reduced. The estimated ejection fraction was in the   range of 25% to 30%. There is akinesis of the apical anterior,   lateral, inferolateral, inferior, and apical myocardium with no   evidence of apical thrombus by definity contrast. The study is   not technically sufficient to allow evaluation of LV diastolic   function. Doppler parameters are consistent with high ventricular   filling pressure. - Mitral valve: There was mild regurgitation. - Left atrium: The atrium was mildly dilated. - Tricuspid valve: There was mild-moderate regurgitation. - Pulmonic valve: There was trivial regurgitation. - Pulmonary arteries: PA peak pressure: 37 mm Hg (S).  Impressions:  - The right ventricular systolic pressure was increased consistent   with mild pulmonary hypertension.   ASSESSMENT:    1. Chronic systolic heart failure ([place])   2. Chronic atrial fibrillation   3. Biventricular implantable cardioverter-defibrillator in situ   4. CAD S/P LAD PCI- 2000      PLAN:  In order of problems listed above:  1. Chronic systolic heart failure: weight is down 3 lbs. Looks well compensated on lasix 80 mg in am and 40 mg in the pm.   Instructed on importance of sodium  and fluid restriction. Will continue his other therapy.  2. ICM s/p ICD:   Device check in Dec was good with BiV pacing.   3. CAD: Denies any anginal symptoms.  4. Hyperlipidemia: On Pravachol 40 mg daily.  5. Persistent atrial fibrillation: He had AV nodal ablation in early 2018.  Continue Coumadin.   6. CKD stage 3.     Medication Adjustments/Labs and Tests Ordered: Current medicines are reviewed at length with the patient today.  Concerns regarding medicines are outlined above.  Medication changes, Labs and Tests ordered today are listed in the Patient Instructions below. There are no Patient Instructions on file for this visit.   Signed, Rondy Krupinski Martinique, MD  01/22/2019 11:50 AM    [facility] [address], [address], [place]  [address] Phone: [phone]; Fax: [phone]

## 2019-01-22 ENCOUNTER — Ambulatory Visit: Payer: Medicare Other | Admitting: Cardiology

## 2019-01-22 ENCOUNTER — Encounter: Payer: Self-pay | Admitting: Cardiology

## 2019-01-22 ENCOUNTER — Other Ambulatory Visit: Payer: Self-pay

## 2019-01-22 VITALS — BP 112/70 | HR 81 | Ht 70.0 in | Wt 216.4 lb

## 2019-01-22 DIAGNOSIS — I482 Chronic atrial fibrillation, unspecified: Secondary | ICD-10-CM | POA: Diagnosis not present

## 2019-01-22 DIAGNOSIS — I2510 Atherosclerotic heart disease of native coronary artery without angina pectoris: Secondary | ICD-10-CM | POA: Diagnosis not present

## 2019-01-22 DIAGNOSIS — I5022 Chronic systolic (congestive) heart failure: Secondary | ICD-10-CM | POA: Diagnosis not present

## 2019-01-22 DIAGNOSIS — Z9861 Coronary angioplasty status: Secondary | ICD-10-CM

## 2019-01-22 DIAGNOSIS — Z95810 Presence of automatic (implantable) cardiac defibrillator: Secondary | ICD-10-CM

## 2019-01-22 MED ORDER — NITROGLYCERIN 0.4 MG SL SUBL: 0.4000 mg | SUBLINGUAL_TABLET | SUBLINGUAL | 11 refills | Status: DC | PRN

## 2019-01-24 ENCOUNTER — Ambulatory Visit (INDEPENDENT_AMBULATORY_CARE_PROVIDER_SITE_OTHER): Payer: Medicare Other

## 2019-01-24 DIAGNOSIS — I5022 Chronic systolic (congestive) heart failure: Secondary | ICD-10-CM

## 2019-01-24 DIAGNOSIS — I255 Ischemic cardiomyopathy: Secondary | ICD-10-CM

## 2019-01-27 LAB — CUP PACEART REMOTE DEVICE CHECK
Battery Remaining Longevity: 10 mo
Battery Voltage: 2.88 V
Brady Statistic AP VP Percent: 0 %
Brady Statistic AP VS Percent: 0 %
Brady Statistic AS VP Percent: 98.38 %
Brady Statistic AS VS Percent: 1.62 %
Brady Statistic RA Percent Paced: 0 %
Brady Statistic RV Percent Paced: 97.84 %
HIGH POWER IMPEDANCE MEASURED VALUE: 53 Ohm
HighPow Impedance: 62 Ohm
Implantable Lead Implant Date: 20090226
Implantable Lead Implant Date: 20090226
Implantable Lead Location: 753858
Implantable Lead Location: 753859
Implantable Lead Location: 753860
Implantable Lead Model: 4194
Implantable Lead Model: 6947
Lead Channel Impedance Value: 323 Ohm
Lead Channel Impedance Value: 380 Ohm
Lead Channel Impedance Value: 513 Ohm
Lead Channel Impedance Value: 646 Ohm
Lead Channel Impedance Value: 817 Ohm
Lead Channel Pacing Threshold Amplitude: 0.625 V
Lead Channel Sensing Intrinsic Amplitude: 13 mV
Lead Channel Sensing Intrinsic Amplitude: 13 mV
Lead Channel Sensing Intrinsic Amplitude: 4.625 mV
Lead Channel Sensing Intrinsic Amplitude: 4.625 mV
Lead Channel Setting Pacing Amplitude: 1.75 V
Lead Channel Setting Pacing Amplitude: 2.5 V
Lead Channel Setting Pacing Pulse Width: 0.6 ms
Lead Channel Setting Sensing Sensitivity: 0.3 mV
MDC IDC LEAD IMPLANT DT: 20090226
MDC IDC MSMT LEADCHNL LV PACING THRESHOLD PULSEWIDTH: 0.6 ms
MDC IDC MSMT LEADCHNL RV IMPEDANCE VALUE: 323 Ohm
MDC IDC PG IMPLANT DT: 20140929
MDC IDC SESS DTM: 20200206062304
MDC IDC SET LEADCHNL LV PACING PULSEWIDTH: 0.6 ms

## 2019-02-04 NOTE — Progress Notes (Signed)
Remote ICD transmission.   

## 2019-02-18 ENCOUNTER — Ambulatory Visit (INDEPENDENT_AMBULATORY_CARE_PROVIDER_SITE_OTHER): Payer: Medicare Other

## 2019-02-18 ENCOUNTER — Telehealth: Payer: Self-pay

## 2019-02-18 DIAGNOSIS — Z95810 Presence of automatic (implantable) cardiac defibrillator: Secondary | ICD-10-CM | POA: Diagnosis not present

## 2019-02-18 DIAGNOSIS — I5022 Chronic systolic (congestive) heart failure: Secondary | ICD-10-CM

## 2019-02-18 NOTE — Telephone Encounter (Signed)
Spoke with patient for monthly ICM follow up.  Advised of remote transmission results.  Patient reports he thinks fluid intake is more than 64 oz recommended but unsure exactly how much he drinks.    SYMPTOMS: Weight gain of 5-6 lbs in past 2 weeks.    OPTIVOL REPORT: See cc'd ICM chart note for details.  Thoracic impedance suggesting fluid accumulation since 01/28/2019  PRESCRIBED: Furosemide 40 mg take 2 tablets (80 mg total) every AM and 1 tablet (40 mg total) every PM.  Potassium 20 mEq take 1 tablet by mouth daily.  Take additional Tablet when taking extra Lasix.  He has missed 2 afternoon doses in last 2 weeks.  RECOMMENDATIONS:  Advised patient would send information to Dr Martinique for review and if any recommendations will call back.    Recheck fluid levels and remote transmission scheduled 02/25/2019.  Please advise if any changes are recommended. Will call patient back if changes are advised.

## 2019-02-18 NOTE — Progress Notes (Signed)
EPIC Encounter for ICM Monitoring  [name] is a 73 y.o. male Date: 02/18/2019 Primary Care Physican: [name], MD Primary [place] Electrophysiologist: Vergie Living Pacing: 98% Last Weight:215lbs Today's Weight: 220 lbs     Heart failure questions reviewed. Weight gain 5-6 lbs in the last 2 weeks. He thinks he drinks more than 64 oz because of dry mouth. In the last 2 weeks he has missed 2 afternoon Furosemide dosages.   Thoracic impedance abnormal suggesting fluid accumulation since 01/28/2019.   Prescribed: Furosemide40 mg Take 2 tablets ( 80 mg ) in am and 1 tablet ( 40 mg ) in PM.  Potassium 20 mEq TAKE 1 TABLET BY MOUTH DAILY. TAKE ADDITIONAL TABLET WHEN TAKING EXTRA LASIX  Labs: 12/06/2018 Creatinine 1.79, BUN 30, Potassium 3.7, Sodium 146, GFR 37-43 07/12/2018 Creatinine 1.64, BUN 30, Potassium 4.1, Sodium 144, GFR 42-48 05/17/2018 Creatinine 1.65, BUN 31, Potassium 4.4, Sodium 146, GFR 41-48, BNP 268.9  Recommendations: Phone note sent to Dr Martinique to ask if any recommendations are needed.  Advised patient to try to limit fluid intake to 64 oz.   Follow-up plan: ICM clinic phone appointment on3/08/2019 (manual before 3/16) to recheck fluid levels.       Copy of ICM check sent to Dr. Caryl Comes and Dr Martinique.   3 month ICM trend: 02/18/2019    1 Year ICM trend:       [name], RN 02/18/2019 11:24 AM

## 2019-02-22 NOTE — Progress Notes (Signed)
Have not received any recommendation from Dr Martinique. Reviewed with Chanetta Marshall, NP and recommendation to check weight on 02/25/2019.  If weight is still up obtain BMET and if stable then increase Lasix x 3 days if needed or follow any recommendations by Dr Martinique if received.

## 2019-02-25 ENCOUNTER — Ambulatory Visit (INDEPENDENT_AMBULATORY_CARE_PROVIDER_SITE_OTHER): Payer: Medicare Other

## 2019-02-25 DIAGNOSIS — Z95810 Presence of automatic (implantable) cardiac defibrillator: Secondary | ICD-10-CM

## 2019-02-25 DIAGNOSIS — I5022 Chronic systolic (congestive) heart failure: Secondary | ICD-10-CM

## 2019-02-25 NOTE — Telephone Encounter (Signed)
New updated remote transmission received 02/25/2019 and is at normal baseline.  See ICM note 02/25/2019 for report details.

## 2019-02-26 NOTE — Progress Notes (Signed)
EPIC Encounter for ICM Monitoring  [name] is a 73 y.o. male Date: 02/26/2019 Primary Care Physican: [name], MD Primary [place] Electrophysiologist: Vergie Living Pacing: 98% Last Weight:215lbs Today's Weight: 220 lbs    Attempted call to patient and unable to reach.  Left detailed message per DPR regarding transmission. Transmission reviewed.   Thoracic impedance returned close to normal since last remote transmission on 02/18/2019.  Prescribed: Furosemide40 mgTake 2 tablets ( 80 mg ) in am and 1 tablet ( 40 mg ) in PM.Potassium 20 mEqTAKE 1 TABLET BY MOUTH DAILY. TAKE ADDITIONAL TABLET WHEN TAKING EXTRA LASIX  Labs: 12/06/2018 Creatinine 1.79, BUN 30, Potassium 3.7, Sodium 146, GFR 37-43 07/12/2018 Creatinine 1.64, BUN 30, Potassium 4.1, Sodium 144, GFR 42-48 05/17/2018 Creatinine 1.65, BUN 31, Potassium 4.4, Sodium 146, GFR 41-48, BNP 268.9  Recommendations:Left voice mail with ICM number and encouraged to call if experiencing any fluid symptoms.  Follow-up plan: ICM clinic phone appointment on4/13/2020.   Office appt 05/27/2019 with Dr. Martinique.    Copy of ICM check sent to Dr. Caryl Comes and Dr Martinique.   3 month ICM trend: 02/25/2019    1 Year ICM trend:       [name], RN 02/26/2019 2:30 PM

## 2019-03-01 ENCOUNTER — Other Ambulatory Visit: Payer: Self-pay | Admitting: Cardiology

## 2019-03-01 DIAGNOSIS — I5022 Chronic systolic (congestive) heart failure: Secondary | ICD-10-CM

## 2019-04-01 ENCOUNTER — Other Ambulatory Visit: Payer: Self-pay

## 2019-04-01 ENCOUNTER — Ambulatory Visit (INDEPENDENT_AMBULATORY_CARE_PROVIDER_SITE_OTHER): Payer: Medicare Other

## 2019-04-01 DIAGNOSIS — Z95810 Presence of automatic (implantable) cardiac defibrillator: Secondary | ICD-10-CM | POA: Diagnosis not present

## 2019-04-01 DIAGNOSIS — I5022 Chronic systolic (congestive) heart failure: Secondary | ICD-10-CM | POA: Diagnosis not present

## 2019-04-02 NOTE — Progress Notes (Signed)
EPIC Encounter for ICM Monitoring  [name] is a 73 y.o. male Date: 04/02/2019 Primary Care Physican: [name], MD Primary [place] Electrophysiologist: Vergie Living Pacing: 97.0% Last Weight:220lbs 04/02/2019 Weight: unknown  Battery Longevity: ~8 months   Transmission reviewed.   Thoracic impedance normal.  Prescribed: Furosemide40 mgTake 2 tablets ( 80 mg ) in am and 1 tablet ( 40 mg ) inPM.Potassium 20 mEqTAKE 1 TABLET BY MOUTH DAILY. TAKE ADDITIONAL TABLET WHEN TAKING EXTRA LASIX  Labs: 12/06/2018 Creatinine 1.79, BUN 30, Potassium 3.7, Sodium 146, GFR 37-43 07/12/2018 Creatinine 1.64, BUN 30, Potassium 4.1, Sodium 144, GFR 42-48 05/17/2018 Creatinine 1.65, BUN 31, Potassium 4.4, Sodium 146, GFR 41-48, BNP 268.9  Recommendations:None.  Follow-up plan: ICM clinic phone appointment on5/15/2020.   Office appt 05/27/2019 with Dr. Martinique.    Copy of ICM check sent to Dr. Caryl Comes.   3 month ICM trend: 04/01/2019    1 Year ICM trend:       [name], RN 04/02/2019 10:30 AM

## 2019-04-29 ENCOUNTER — Other Ambulatory Visit: Payer: Self-pay

## 2019-04-29 ENCOUNTER — Other Ambulatory Visit: Payer: Self-pay | Admitting: Physician Assistant

## 2019-04-29 ENCOUNTER — Ambulatory Visit
Admission: RE | Admit: 2019-04-29 | Discharge: 2019-04-29 | Disposition: A | Discharge status: Home / Self Care | Payer: Medicare Other | Source: Ambulatory Visit | Attending: Physician Assistant | Admitting: Physician Assistant

## 2019-04-29 DIAGNOSIS — M25561 Pain in right knee: Secondary | ICD-10-CM

## 2019-04-29 DIAGNOSIS — M25461 Effusion, right knee: Secondary | ICD-10-CM

## 2019-04-30 ENCOUNTER — Other Ambulatory Visit: Payer: Self-pay | Admitting: Cardiology

## 2019-05-02 ENCOUNTER — Other Ambulatory Visit: Payer: Self-pay

## 2019-05-02 ENCOUNTER — Ambulatory Visit (INDEPENDENT_AMBULATORY_CARE_PROVIDER_SITE_OTHER): Payer: Medicare Other | Admitting: *Deleted

## 2019-05-02 DIAGNOSIS — I255 Ischemic cardiomyopathy: Secondary | ICD-10-CM

## 2019-05-02 LAB — CUP PACEART REMOTE DEVICE CHECK
Battery Remaining Longevity: 8 mo
Battery Voltage: 2.86 V
Brady Statistic AP VP Percent: 0 %
Brady Statistic AP VS Percent: 0 %
Brady Statistic AS VP Percent: 96.15 %
Brady Statistic AS VS Percent: 3.85 %
Brady Statistic RA Percent Paced: 0 %
Brady Statistic RV Percent Paced: 95.11 %
Date Time Interrogation Session: 20200514041804
HighPow Impedance: 52 Ohm
HighPow Impedance: 61 Ohm
Implantable Lead Implant Date: 20090226
Implantable Lead Implant Date: 20090226
Implantable Lead Implant Date: 20090226
Implantable Lead Location: 753858
Implantable Lead Location: 753859
Implantable Lead Location: 753860
Implantable Lead Model: 4194
Implantable Lead Model: 5076
Implantable Lead Model: 6947
Implantable Pulse Generator Implant Date: 20140929
Lead Channel Impedance Value: 323 Ohm
Lead Channel Impedance Value: 323 Ohm
Lead Channel Impedance Value: 342 Ohm
Lead Channel Impedance Value: 513 Ohm
Lead Channel Impedance Value: 608 Ohm
Lead Channel Impedance Value: 779 Ohm
Lead Channel Pacing Threshold Amplitude: 0.625 V
Lead Channel Pacing Threshold Pulse Width: 0.6 ms
Lead Channel Sensing Intrinsic Amplitude: 13 mV
Lead Channel Sensing Intrinsic Amplitude: 13 mV
Lead Channel Sensing Intrinsic Amplitude: 3.875 mV
Lead Channel Sensing Intrinsic Amplitude: 3.875 mV
Lead Channel Setting Pacing Amplitude: 1.75 V
Lead Channel Setting Pacing Amplitude: 2.5 V
Lead Channel Setting Pacing Pulse Width: 0.6 ms
Lead Channel Setting Pacing Pulse Width: 0.6 ms
Lead Channel Setting Sensing Sensitivity: 0.3 mV

## 2019-05-03 ENCOUNTER — Ambulatory Visit (INDEPENDENT_AMBULATORY_CARE_PROVIDER_SITE_OTHER): Payer: Medicare Other

## 2019-05-03 ENCOUNTER — Other Ambulatory Visit: Payer: Self-pay

## 2019-05-03 DIAGNOSIS — Z95810 Presence of automatic (implantable) cardiac defibrillator: Secondary | ICD-10-CM

## 2019-05-03 DIAGNOSIS — I5022 Chronic systolic (congestive) heart failure: Secondary | ICD-10-CM | POA: Diagnosis not present

## 2019-05-06 NOTE — Progress Notes (Signed)
EPIC Encounter for ICM Monitoring  [name] is a 73 y.o. male Date: 05/06/2019 Primary Care Physican: [name], MD Primary [place] Electrophysiologist: Vergie Living Pacing: 94.5% 05/06/2019 Weight: 213.2 - 215.6 lbs  Battery Longevity: ~7 months   Transmission reviewed. Spoke with patient and asymptomatic for fluid accumulation.  He is having severe knee pain and not very mobile at this time.  He is using a cane to ambulate.  Thoracic impedance abnormal suggesting fluid accumulation since 04/27/2019 but trending back toward baseline.  Prescribed: Furosemide40 mgTake 2 tablets ( 80 mg ) in am and 1 tablet ( 40 mg ) inPM.Potassium 20 mEqTAKE 1 TABLET BY MOUTH DAILY. TAKE ADDITIONAL TABLET WHEN TAKING EXTRA LASIX  Labs: 12/06/2018 Creatinine 1.79, BUN 30, Potassium 3.7, Sodium 146, GFR 37-43 07/12/2018 Creatinine 1.64, BUN 30, Potassium 4.1, Sodium 144, GFR 42-48 05/17/2018 Creatinine 1.65, BUN 31, Potassium 4.4, Sodium 146, GFR 41-48, BNP 268.9  Recommendations:Advised to limit salt intake and encouraged to call if he experiences any fluid symptoms.   Follow-up plan: ICM clinic phone appointment on6/04/2019 to recheck fluid levels. Office appt 05/27/2019 with Dr.Jordan.   Copy of ICM check sent to [place].    3 month ICM trend: 05/03/2019    1 Year ICM trend:       [name], RN 05/06/2019 4:39 PM

## 2019-05-08 ENCOUNTER — Encounter: Payer: Self-pay | Admitting: Cardiology

## 2019-05-08 NOTE — Progress Notes (Signed)
Remote ICD transmission.   

## 2019-05-24 ENCOUNTER — Ambulatory Visit (INDEPENDENT_AMBULATORY_CARE_PROVIDER_SITE_OTHER): Payer: Medicare Other

## 2019-05-24 DIAGNOSIS — Z95810 Presence of automatic (implantable) cardiac defibrillator: Secondary | ICD-10-CM

## 2019-05-24 DIAGNOSIS — I5022 Chronic systolic (congestive) heart failure: Secondary | ICD-10-CM

## 2019-05-24 NOTE — Progress Notes (Signed)
EPIC Encounter for ICM Monitoring  [name] is a 73 y.o. male Date: 05/24/2019 Primary Care Physican: [name], MD Primary [place] Electrophysiologist: Vergie Living Pacing: 94.5% 5/18/2020Weight:213.2 - 215.6 lbs  Battery Longevity: ~7 months   Transmission reviewed.   Thoracic impedance returned to normal.  Prescribed: Furosemide40 mgTake 2 tablets ( 80 mg ) in am and 1 tablet ( 40 mg ) inPM.Potassium 20 mEqTAKE 1 TABLET BY MOUTH DAILY. TAKE ADDITIONAL TABLET WHEN TAKING EXTRA LASIX  Labs: 12/06/2018 Creatinine 1.79, BUN 30, Potassium 3.7, Sodium 146, GFR 37-43 07/12/2018 Creatinine 1.64, BUN 30, Potassium 4.1, Sodium 144, GFR 42-48 05/17/2018 Creatinine 1.65, BUN 31, Potassium 4.4, Sodium 146, GFR 41-48, BNP 268.9  Recommendations:None.   Follow-up plan: ICM clinic phone appointment on6/29/2020  3 month ICM trend: 05/24/2019    1 Year ICM trend:       [name], RN 05/24/2019 4:44 PM

## 2019-05-27 ENCOUNTER — Ambulatory Visit: Payer: Medicare Other | Admitting: Cardiology

## 2019-06-17 ENCOUNTER — Ambulatory Visit (INDEPENDENT_AMBULATORY_CARE_PROVIDER_SITE_OTHER): Payer: Medicare Other

## 2019-06-17 DIAGNOSIS — Z95810 Presence of automatic (implantable) cardiac defibrillator: Secondary | ICD-10-CM

## 2019-06-17 DIAGNOSIS — I5022 Chronic systolic (congestive) heart failure: Secondary | ICD-10-CM

## 2019-06-17 NOTE — Progress Notes (Signed)
EPIC Encounter for ICM Monitoring  [name] is a 73 y.o. male Date: 06/17/2019 Primary Care Physican: [name], MD Primary [place] Electrophysiologist: Vergie Living Pacing: 94.5% 5/18/2020Weight:213.2 - 215.6 lbs  Battery Longevity: ~59months  Clinical Status (24-May-2019 to 17-Jun-2019)  AT/AF    7 episodes  Time in AT/AF    <0.1 hr/day (0.4%)  Longest AT/AF   15 minutes  2 hours in AT/AF Since Last Session  Attempted call to patient and unable to reach.  Left message to return call detailed message per DPR regarding transmission. Transmission reviewed.   Optivol thoracic impedanceabnormal suggesting possible fluid accumulation since ~06/03/2019.  Prescribed: Furosemide40 mgTake 2 tablets ( 80 mg ) in am and 1 tablet ( 40 mg ) inPM.Potassium 20 mEqTAKE 1 TABLET BY MOUTH DAILY. TAKE ADDITIONAL TABLET WHEN TAKING EXTRA LASIX  Labs: 12/06/2018 Creatinine 1.79, BUN 30, Potassium 3.7, Sodium 146, GFR 37-43 07/12/2018 Creatinine 1.64, BUN 30, Potassium 4.1, Sodium 144, GFR 42-48 05/17/2018 Creatinine 1.65, BUN 31, Potassium 4.4, Sodium 146, GFR 41-48, BNP 268.9  Recommendations: Left voice mail with ICM number and encouraged to call if experiencing any fluid symptoms.  Follow-up plan: ICM clinic phone appointment on7/05/2019 (manual send).   Office appt 06/25/2019 with Kerin Ransom, PA.    Copy of ICM check sent to Dr. Caryl Comes and Dr Martinique.   3 month ICM trend: 06/17/2019    1 Year ICM trend:       [name], RN 06/17/2019 12:36 PM

## 2019-06-18 NOTE — Progress Notes (Signed)
Attempted return call to patient as requested by voice mail message.  Message left to return call.

## 2019-06-19 NOTE — Progress Notes (Signed)
Spoke with patient. Transmission reviewed.  He said he has company this past week and probably eating and drinking too much. He does not have any fluid symptoms.  Advised to limit salt to 2000 mg daily and limit fluid intake to 64 oz daily.  Will recheck fluid levels on 06/24/2019 (manual send).  He has an office appt 06/25/2019 with Kerin Ransom, [place].

## 2019-06-24 ENCOUNTER — Ambulatory Visit (INDEPENDENT_AMBULATORY_CARE_PROVIDER_SITE_OTHER): Payer: Medicare Other

## 2019-06-24 DIAGNOSIS — Z95810 Presence of automatic (implantable) cardiac defibrillator: Secondary | ICD-10-CM

## 2019-06-24 DIAGNOSIS — I5022 Chronic systolic (congestive) heart failure: Secondary | ICD-10-CM

## 2019-06-25 ENCOUNTER — Ambulatory Visit: Payer: Medicare Other | Admitting: Cardiology

## 2019-06-25 ENCOUNTER — Telehealth: Payer: Self-pay

## 2019-06-25 NOTE — Progress Notes (Signed)
EPIC Encounter for ICM Monitoring  [name] is a 73 y.o. male Date: 06/25/2019 Primary Care Physican: [name], MD Primary [place] Electrophysiologist: Vergie Living Pacing: 98.2% 5/18/2020Weight:213.2 - 215.6 lbs  Battery Longevity: ~9months  Attempted call to patient and unable to reach.  Left message to return call detailed message per DPR regarding transmission. Transmission reviewed.   Optivol thoracic impedanceunchanged from 6/29 report and suggesting possible fluid accumulation continues since ~06/03/2019.  Prescribed: Furosemide40 mgTake 2 tablets ( 80 mg ) in am and 1 tablet ( 40 mg ) inPM.Potassium 20 mEqTAKE 1 TABLET BY MOUTH DAILY. TAKE ADDITIONAL TABLET WHEN TAKING EXTRA LASIX   Labs: 12/06/2018 Creatinine 1.79, BUN 30, Potassium 3.7, Sodium 146, GFR 37-43 07/12/2018 Creatinine 1.64, BUN 30, Potassium 4.1, Sodium 144, GFR 42-48 05/17/2018 Creatinine 1.65, BUN 31, Potassium 4.4, Sodium 146, GFR 41-48, BNP 268.9  Recommendations: Left voice mail with ICM number and encouraged to call if experiencing any fluid symptoms.  Follow-up plan: ICM clinic phone appointment on7/21/2020 to recheck fluid levels.   Office appt 07/10/2019 with Kerin Ransom, PA.    Copy of ICM check sent to Dr. Caryl Comes and Dr Martinique for review.   3 month ICM trend: 06/24/2019    1 Year ICM trend:       [name], RN 06/25/2019 3:50 PM

## 2019-06-25 NOTE — Telephone Encounter (Signed)
Remote ICM transmission received.  Attempted call to patient regarding ICM remote transmission and left detailed message, per DPR, to return call.    

## 2019-07-04 ENCOUNTER — Other Ambulatory Visit: Payer: Self-pay | Admitting: Cardiology

## 2019-07-09 ENCOUNTER — Ambulatory Visit (INDEPENDENT_AMBULATORY_CARE_PROVIDER_SITE_OTHER): Payer: Medicare Other

## 2019-07-09 DIAGNOSIS — Z95810 Presence of automatic (implantable) cardiac defibrillator: Secondary | ICD-10-CM

## 2019-07-09 DIAGNOSIS — I5022 Chronic systolic (congestive) heart failure: Secondary | ICD-10-CM

## 2019-07-09 NOTE — Progress Notes (Signed)
EPIC Encounter for ICM Monitoring  [name] is a 73 y.o. 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  Transmission reviewed.   Optivol thoracic impedancesuggesting possible fluid accumulation with a few days at baseline since 06/21/2019   Prescribed: Furosemide40 mgTake 2 tablets ( 80 mg ) in am and 1 tablet ( 40 mg ) inPM.Potassium 20 mEqTAKE 1 TABLET BY MOUTH DAILY. TAKE ADDITIONAL TABLET WHEN TAKING EXTRA LASIX   Labs: 12/06/2018 Creatinine 1.79, BUN 30, Potassium 3.7, Sodium 146, GFR 37-43 07/12/2018 Creatinine 1.64, BUN 30, Potassium 4.1, Sodium 144, GFR 42-48 05/17/2018 Creatinine 1.65, BUN 31, Potassium 4.4, Sodium 146, GFR 41-48, BNP 268.9  Recommendations:  Patient will be given any recommendations in office visit with Kerin Ransom, [place] on 07/10/2019  Follow-up plan: ICM clinic phone appointment on8/17/2020.Office appt 07/10/2019 withLuke [place], PA.   Copy of ICM check sent to Dr.Klein and Kerin Ransom, PA  3 month ICM trend: 07/08/2019    1 Year ICM trend:       [name], RN 07/09/2019 5:16 PM

## 2019-07-10 ENCOUNTER — Other Ambulatory Visit: Payer: Self-pay

## 2019-07-10 ENCOUNTER — Ambulatory Visit (INDEPENDENT_AMBULATORY_CARE_PROVIDER_SITE_OTHER): Payer: Medicare Other | Admitting: General Practice

## 2019-07-10 ENCOUNTER — Encounter: Payer: Self-pay | Admitting: Cardiology

## 2019-07-10 ENCOUNTER — Telehealth: Payer: Self-pay | Admitting: Cardiology

## 2019-07-10 VITALS — BP 114/77 | HR 77 | Temp 97.2°F | Ht 70.0 in | Wt 221.0 lb

## 2019-07-10 DIAGNOSIS — Z9861 Coronary angioplasty status: Secondary | ICD-10-CM

## 2019-07-10 DIAGNOSIS — I2510 Atherosclerotic heart disease of native coronary artery without angina pectoris: Secondary | ICD-10-CM | POA: Diagnosis not present

## 2019-07-10 DIAGNOSIS — I482 Chronic atrial fibrillation, unspecified: Secondary | ICD-10-CM

## 2019-07-10 DIAGNOSIS — I5043 Acute on chronic combined systolic (congestive) and diastolic (congestive) heart failure: Secondary | ICD-10-CM

## 2019-07-10 DIAGNOSIS — Z95810 Presence of automatic (implantable) cardiac defibrillator: Secondary | ICD-10-CM

## 2019-07-10 DIAGNOSIS — E785 Hyperlipidemia, unspecified: Secondary | ICD-10-CM

## 2019-07-10 NOTE — Patient Instructions (Signed)
Medication Instructions:  INCREASE Lasix to 80mg  take 1 tablet twice a day for 3 days then go back to original dose of 80mg  in the am and 40 in the afternoon  INCREASE Potassium to 71meq Take 1 tablet twice a day for 3 days then go back to original dose of 1 tablet daily If you need a refill on your cardiac medications before your next appointment, please call your pharmacy.   Lab work: Your physician recommends that you return for lab work in: TODAY-BMET If you have labs (blood work) drawn today and your tests are completely normal, you will receive your results only by: Marland Kitchen MyChart Message (if you have MyChart) OR . A paper copy in the mail If you have any lab test that is abnormal or we need to change your treatment, we will call you to review the results.  Testing/Procedures: NONE  Follow-Up: At [facility], you and your health needs are our priority.  As part of our continuing mission to provide you with exceptional heart care, we have created designated Provider Care Teams.  These Care Teams include your primary Cardiologist (physician) and Advanced Practice Providers (APPs -  Physician Assistants and Nurse Practitioners) who all work together to provide you with the care you need, when you need it. You will need a follow up appointment in 3 months.  Please call our office 2 months in advance to schedule this appointment.  You may see Peter Martinique, MD or one of the following Advanced Practice Providers on your designated Care Team: [place], [place] . Fabian Sharp, PA-C  Any Other Special Instructions Will Be Listed Below (If Applicable).

## 2019-07-10 NOTE — Progress Notes (Signed)
Cardiology Clinic Note   [name] Date of Encounter: 07/10/2019  Primary Care Provider:  [name], MD Primary Cardiologist:  Peter Martinique, MD  Patient Profile    [name] 73 year old male presents to the clinic today for evaluation of his atrial fibrillation, coronary artery disease, and cardiomyopathy.  Past Medical History    Past Medical History:  Diagnosis Date  . AICD (automatic cardioverter/defibrillator) present   . Anemia   . Anxiety   . CHF (congestive heart failure) ([place])   . Chronic renal insufficiency   . Coronary artery disease   . Depression   . Dyslipidemia   . Dysrhythmia    atrial fibrillation  . ED (erectile dysfunction)   . GERD (gastroesophageal reflux disease)   . Incisional hernia, without obstruction or gangrene 04/10/2017  . Ischemic cardiomyopathy    EF 23%  . MI (myocardial infarction) ([place]) 2000   ANTERIOR, s/p PCI  . Other primary cardiomyopathies   . Peripheral vascular disease ([place])    dvt legs 2017  . Persistent atrial fibrillation    on coumadin  . Presence of permanent cardiac pacemaker   . Shortness of breath dyspnea    on exertion   Past Surgical History:  Procedure Laterality Date  . AV NODE ABLATION N/A 01/25/2017   Procedure: AV Node Ablation;  Surgeon: [name], MD;  Location: [place] CV LAB;  Service: Cardiovascular;  Laterality: N/A;  . AV NODE ABLATION  01/25/2017  . BIV ICD GENERTAOR CHANGE OUT N/A 09/16/2013   Procedure: BIV ICD GENERTAOR CHANGE OUT;  Surgeon: [name], MD;  Location: [facility] CATH LAB;  Service: Cardiovascular;  Laterality: N/A;  . BREAST LUMPECTOMY Left   . CARDIAC CATHETERIZATION  02/05/2008   MODERATE TO SEVERE LEFT VENTRICULAR  DYSFUNCTION WITH EF 25-30%  . CARDIAC DEFIBRILLATOR PLACEMENT  2009   MDT BI-VENTRICULAR ICD by Dr Caryl Comes  . COLONOSCOPY WITH PROPOFOL N/A 08/31/2016   Procedure: COLONOSCOPY WITH PROPOFOL;  Surgeon: Wilford Corner, MD;   Location: [facility] ENDOSCOPY;  Service: Endoscopy;  Laterality: N/A;  . CORONARY ANGIOPLASTY  2000   stent  . CORONARY STENT PLACEMENT     LAD  . CYSTOSCOPY N/A 01/25/2016   Procedure: CYSTOSCOPY;  Surgeon: Carolan Clines, MD;  Location: [facility] ORS;  Service: Urology;  Laterality: N/A;  . ESOPHAGOGASTRODUODENOSCOPY (EGD) WITH PROPOFOL N/A 08/31/2016   Procedure: ESOPHAGOGASTRODUODENOSCOPY (EGD) WITH PROPOFOL;  Surgeon: Wilford Corner, MD;  Location: [facility] ENDOSCOPY;  Service: Endoscopy;  Laterality: N/A;  . FRACTURE SURGERY Right 1972   leg  . IMPLANTABLE CARDIOVERTER DEFIBRILLATOR GENERATOR CHANGE  2014  . INCISIONAL HERNIA REPAIR N/A 04/10/2017   Procedure: OPEN REPAIR INCISIONAL VENTRAL HERNIA;  Surgeon: [name], MD;  Location: [place];  Service: General;  Laterality: N/A;  . INSERT / REPLACE / REMOVE PACEMAKER     ICD  . INSERTION OF MESH N/A 04/10/2017   Procedure: INSERTION OF MESH;  Surgeon: [name], MD;  Location: [place];  Service: General;  Laterality: N/A;  . LAPAROTOMY N/A 02/22/2016   Procedure: EXPLORATORY LAPAROTOMY WITH  PATCH OF DUODENAL ULCER;  Surgeon: Rolm Bookbinder, MD;  Location: [place];  Service: General;  Laterality: N/A;  . MULTIPLE TOOTH EXTRACTIONS    . TRANSTHORACIC ECHOCARDIOGRAM  12/2010   EF 30-35%  . TRANSURETHRAL RESECTION OF PROSTATE N/A 01/25/2016   Procedure: TRANSURETHRAL RESECTION OF THE PROSTATE (TURP);  Surgeon: Carolan Clines, MD;  Location: [facility];  Service: Urology;  Laterality: N/A;  . WRIST FRACTURE SURGERY Left 1972    Allergies  Allergies  Allergen Reactions  . Keflex [Cephalexin] Shortness Of Breath and Other (See Comments)    dizziness    History of Present Illness    Mr. Sandoz was last seen by Dr. Peter Martinique 01/22/2019.  During that time he was doing well, denied an increase in his edema/shortness of breath and had maintained stable weight.  INR levels had been stable as well. He received PCI of his proximal LAD in 2000 and had a  subsequent cardiac catheterization in February 2009 that showed nonobstructive disease.  He has a history of atrial fibrillation, is intolerant to amiodarone, had QT prolongation and was not a candidate for Tikosyn, and underwent AV nodal ablation in February 2018.  He had a hospital admission on 09/30/2017 for renal insufficiency.  He was treated for SIRS and sepsis with a temperature of 102 tachypnea.  His troponins were borderline elevated and this was felt to be related to his elevated creatinine of 2.71.  He received IV antibiotics and cultures were negative.  A repeat echocardiogram on 10/01/2017 showed an LVEF of 25 to 30%, mild MR, mild to moderate TR, akinesis of the apical anterior lateral inferior lateral and inferior apical myocardium with no evidence of apical thrombus PA peak pressure 37 mmHg. In October 2017, his optive all reading indicated that he was fluid volume overloaded his Lasix was increased to 40 mg twice daily.  He was started on Entresto but developed hypotension, he later transitioned back to losartan.  His last ICD check 04/2019 indicated normal device function and 8 months of battery life remaining. His other PMH includes essential hypertension, chronic systolic heart failure, nonischemic cardiomyopathy, hypertension, dysrhythmia, chronic coronary artery disease, sleep apnea esophageal reflux, urinary retention, acute renal failure chronic renal insufficiency, BPH, biventricular ICD [employer], hyperglycemia, hyperlipidemia, anxiety, anemia, and generalized abdominal pain.  He presents to the clinic today and states he feels well.  He continues to walk his dog around the neighborhood daily.  He is wearing his knee-high compression stockings and states at times they are hard to get on but he realizes the benefit.  He maintains a low-sodium diet.  He also states he has some right knee pain that he has been treating with ice 2-3 times a day.  He states he is a participant in the heart  failure group, has an automated heart failure questionnaire, scale, and home blood pressure cuff.  He denies chest pain, lower extremity edema, increased shortness of breath, fatigue, weakness, presyncope, syncope, melena, hematuria, hemoptysis, orthopnea, and PND.  Home Medications    Prior to Admission medications   Medication Sig Start Date End Date Taking? Authorizing Provider  acetaminophen (TYLENOL) 500 MG tablet Take 500 mg by mouth every 8 (eight) hours as needed for mild pain.    [provider]  digoxin (LANOXIN) 0.125 MG tablet Take 1/2 tablet every other day 01/22/19   Martinique, Peter M, MD  fenofibrate 160 MG tablet TAKE 1 TABLET BY MOUTH  DAILY 03/01/19   Martinique, Peter M, MD  ferrous sulfate 325 (65 FE) MG tablet Take 325 mg by mouth 2 (two) times daily.    [provider]  folic acid (FOLVITE) 956 MCG tablet Take 800 mcg by mouth daily.     [provider]  furosemide (LASIX) 40 MG tablet Take 2 tablets ( 80 mg ) in am and 1 tablet ( 40 mg ) in pm. 12/10/18  Martinique, Peter M, MD  losartan (COZAAR) 25 MG tablet TAKE 1 TABLET BY MOUTH  DAILY 07/04/19   Martinique, Peter M, MD  metoprolol succinate (TOPROL-XL) 25 MG 24 hr tablet TAKE 1 TABLET BY MOUTH  DAILY AT 12 NOON. 03/01/19   Martinique, Peter M, MD  Multiple Vitamins-Minerals (MULTIVITAMIN ADULTS PO) Take 1 tablet by mouth daily.    [provider]  nitroGLYCERIN (NITROSTAT) 0.4 MG SL tablet Place 1 tablet (0.4 mg total) under the tongue every 5 (five) minutes as needed for chest pain. 01/22/19 04/22/19  Martinique, Peter M, MD  pantoprazole (PROTONIX) 40 MG tablet TAKE 2 TABLETS BY MOUTH  DAILY 04/30/19   Martinique, Peter M, MD  potassium chloride SA (K-DUR,KLOR-CON) 20 MEQ tablet TAKE 1 TABLET BY MOUTH  DAILY. TAKE ADDITIONAL  TABLET WHEN TAKING EXTRA  LASIX 03/01/19   Martinique, Peter M, MD  pravastatin (PRAVACHOL) 40 MG tablet TAKE 1 TABLET BY MOUTH  DAILY 04/30/19   Martinique, Peter M, MD  sertraline (ZOLOFT) 100 MG  tablet Take 100 mg by mouth daily.      [provider]  vitamin C (ASCORBIC ACID) 250 MG tablet Take 250 mg by mouth 2 (two) times daily.    [provider]  warfarin (COUMADIN) 5 MG tablet Take 0.5 tablets (2.5 mg total) by mouth daily at 6 PM. Patient taking differently: Take 1 mg by mouth daily at 6 PM.  03/02/16   Theodis Blaze, MD    Family History    Family History  Problem Relation Age of Onset  . Breast cancer Mother   . Depression Mother   . Heart attack Father   . Breast cancer Sister    He indicated that his mother is deceased. He indicated that his father is deceased. He indicated that his sister is alive. He indicated that his maternal grandmother is deceased. He indicated that his maternal grandfather is deceased. He indicated that his paternal grandmother is deceased. He indicated that his paternal grandfather is deceased.  Social History    Social History   Socioeconomic History  . Marital status: Married    Spouse name: Not on file  . Number of children: 2  . Years of education: Not on file  . Highest education level: Not on file  Occupational History  . Occupation: disabled    Employer: RETIRED  Social Needs  . Financial resource strain: Not on file  . Food insecurity    Worry: Not on file    Inability: Not on file  . Transportation needs    Medical: Not on file    Non-medical: Not on file  Tobacco Use  . Smoking status: Former Smoker    Types: Cigarettes    Quit date: 12/23/1998    Years since quitting: 20.5  . Smokeless tobacco: Never Used  Substance and Sexual Activity  . Alcohol use: No  . Drug use: No  . Sexual activity: Not on file  Lifestyle  . Physical activity    Days per week: Not on file    Minutes per session: Not on file  . Stress: Not on file  Relationships  . Social Herbalist on phone: Not on file    Gets together: Not on file    Attends religious service: Not on file    Active member of club or  organization: Not on file    Attends meetings of clubs or organizations: Not on file    Relationship status:  Not on file  . Intimate partner violence    Fear of current or ex partner: Not on file    Emotionally abused: Not on file    Physically abused: Not on file    Forced sexual activity: Not on file  Other Topics Concern  . Not on file  Social History Narrative   Lives in [place].  Retired Teacher, English as a foreign language           Review of Systems    General:  No chills, fever, night sweats or weight changes.  Cardiovascular:  No chest pain, dyspnea on exertion, edema, orthopnea, palpitations, paroxysmal nocturnal dyspnea. Dermatological: No rash, lesions/masses Respiratory: No cough, dyspnea Urologic: No hematuria, dysuria Abdominal:   No nausea, vomiting, diarrhea, bright red blood per rectum, melena, or hematemesis Neurologic:  No visual changes, wkns, changes in mental status. All other systems reviewed and are otherwise negative except as noted above.  Physical Exam    VS:  BP 114/77   Pulse 77   Temp (!) 97.2 F (36.2 C)   Ht 5\' 10"  (1.778 m)   Wt 221 lb (100.2 kg)   SpO2 96%   BMI 31.71 kg/m  , BMI Body mass index is 31.71 kg/m. GEN: Well nourished, well developed, in no acute distress. HEENT: normal. Neck: Supple, no JVD, carotid bruits, or masses. Cardiac: RRR, no murmurs, rubs, or gallops. No clubbing, cyanosis, nonpitting generalized lower extremity edema.  Radials/DP/PT 2+ and equal bilaterally.  Respiratory:  Respirations regular and unlabored, clear to auscultation bilaterally. GI: Soft, nontender, nondistended, BS + x 4. MS: no deformity or atrophy. Skin: warm and dry, no rash. Neuro:  Strength and sensation are intact. Psych: Normal affect.  Accessory Clinical Findings    ECG personally reviewed by me today-ventricular paced 76 bpm- No acute changes  EKG 10/12/2018: Ventricular paced 73 bpm  EKG 11/20/2017: Ventricular paced 70 bpm  Echocardiogram  10/01/2017:  Left ventricle: The cavity size was normal. Systolic function was   severely reduced. The estimated ejection fraction was in the   range of 25% to 30%. There is akinesis of the apical anterior,   lateral, inferolateral, inferior, and apical myocardium with no   evidence of apical thrombus by definity contrast. The study is   not technically sufficient to allow evaluation of LV diastolic   function. Doppler parameters are consistent with high ventricular   filling pressure. - Mitral valve: There was mild regurgitation. - Left atrium: The atrium was mildly dilated. - Tricuspid valve: There was mild-moderate regurgitation. - Pulmonic valve: There was trivial regurgitation. - Pulmonary arteries: PA peak pressure: 37 mm Hg (S).  Assessment & Plan   1.  Acute on chronic combined systolic/diastolic heart failure-Echocardiogram 10/01/2017 LVEF 25 to 30%, mild mitral valve regurgitation, left atrium mildly dilated, tricuspid valve mild to moderate regurgitation, PA peak pressure 37 mmHg.  Weight increase of around 6 pounds, Optivol thoracic impedancesuggests fluid accumulation Continue digoxin 0.125 mg half tablet every other day Continue losartan potassium 25 mg tablet daily continue metoprolol succinate 25 mg tablet daily Increase furosemide to 80 mg in the a.m. and 80 mg in the p.m. x3 days then resume 80 mg in the a.m. and 40 mg in the p.m. dosing Increase potassium chloride to 40 mg daily x3 days then resume 20 mg daily dosing Maintain low-sodium diet Increase physical activity as tolerated Continue use of compression stockings Order BMP  2.  Chronic atrial fibrillation- AV nodal ablation 2018. Continue warfarin 2.5 mg tablet daily  Monitored by Coumadin clinic  3.  Biventricular implantable cardioverter-defibrillator in situ BiV paced 76 bpm As of 05/02/2019 device function normal, 7 months to ERI. Monitored by electrophysiology  4.  Coronary artery disease s/p LAD  PCI-2000, no chest pain today, continues to be physically active without chest discomfort. Continue nitroglycerin sublingual tablet 0.4 mg as needed Continue heart healthy low-sodium diet Continue physical activity Continue fenofibrate 160 mg tablet daily Continue pravastatin sodium 40 mg tablet daily  5.  Hyperlipidemia-LDL 66 (02/27/2016) Continue fenofibrate 160 mg tablet daily Continue pravastatin sodium 40 mg tablet daily Continue heart healthy low-sodium diet Continue physical activity Monitored by PCP  Disposition: Follow-up with Dr. Martinique in 3 months.  Deberah Pelton, NP-C 07/10/2019, 4:37 PM

## 2019-07-10 NOTE — Telephone Encounter (Signed)
I called pt, pt did not have voicemail, could not leave a message,

## 2019-07-11 LAB — BASIC METABOLIC PANEL
BUN/Creatinine Ratio: 18 (ref 10–24)
BUN: 25 mg/dL (ref 8–27)
CO2: 23 mmol/L (ref 20–29)
Calcium: 9.9 mg/dL (ref 8.6–10.2)
Chloride: 104 mmol/L (ref 96–106)
Creatinine, Ser: 1.39 mg/dL — ABNORMAL HIGH (ref 0.76–1.27)
GFR calc Af Amer: 59 mL/min/{1.73_m2} — ABNORMAL LOW (ref >59)
GFR calc non Af Amer: 51 mL/min/{1.73_m2} — ABNORMAL LOW (ref >59)
Glucose: 87 mg/dL (ref 65–99)
Potassium: 3.7 mmol/L (ref 3.5–5.2)
Sodium: 144 mmol/L (ref 134–144)

## 2019-08-01 ENCOUNTER — Ambulatory Visit (INDEPENDENT_AMBULATORY_CARE_PROVIDER_SITE_OTHER): Payer: Medicare Other | Admitting: *Deleted

## 2019-08-01 DIAGNOSIS — I255 Ischemic cardiomyopathy: Secondary | ICD-10-CM | POA: Diagnosis not present

## 2019-08-01 LAB — CUP PACEART REMOTE DEVICE CHECK
Battery Remaining Longevity: 6 mo
Battery Remaining Longevity: 6 mo
Battery Voltage: 2.83 V
Battery Voltage: 2.83 V
Brady Statistic AP VP Percent: 0 %
Brady Statistic AP VP Percent: 0 %
Brady Statistic AP VS Percent: 0 %
Brady Statistic AP VS Percent: 0 %
Brady Statistic AS VP Percent: 98.36 %
Brady Statistic AS VP Percent: 100 %
Brady Statistic AS VS Percent: 1.64 %
Brady Statistic AS VS Percent: 0 %
Brady Statistic RA Percent Paced: 0 %
Brady Statistic RA Percent Paced: 0 %
Brady Statistic RV Percent Paced: 97.7 %
Brady Statistic RV Percent Paced: 98.46 %
Date Time Interrogation Session: 20200813083724
Date Time Interrogation Session: 20200813084042
HighPow Impedance: 47 Ohm
HighPow Impedance: 54 Ohm
HighPow Impedance: 47 Ohm
HighPow Impedance: 54 Ohm
Implantable Lead Implant Date: 20090226
Implantable Lead Implant Date: 20090226
Implantable Lead Implant Date: 20090226
Implantable Lead Implant Date: 20090226
Implantable Lead Implant Date: 20090226
Implantable Lead Implant Date: 20090226
Implantable Lead Location: 753858
Implantable Lead Location: 753859
Implantable Lead Location: 753860
Implantable Lead Location: 753858
Implantable Lead Location: 753859
Implantable Lead Location: 753860
Implantable Lead Model: 4194
Implantable Lead Model: 5076
Implantable Lead Model: 6947
Implantable Lead Model: 4194
Implantable Lead Model: 5076
Implantable Lead Model: 6947
Implantable Pulse Generator Implant Date: 20140929
Implantable Pulse Generator Implant Date: 20140929
Lead Channel Impedance Value: 285 Ohm
Lead Channel Impedance Value: 323 Ohm
Lead Channel Impedance Value: 380 Ohm
Lead Channel Impedance Value: 570 Ohm
Lead Channel Impedance Value: 570 Ohm
Lead Channel Impedance Value: 722 Ohm
Lead Channel Impedance Value: 285 Ohm
Lead Channel Impedance Value: 323 Ohm
Lead Channel Impedance Value: 380 Ohm
Lead Channel Impedance Value: 570 Ohm
Lead Channel Impedance Value: 570 Ohm
Lead Channel Impedance Value: 722 Ohm
Lead Channel Pacing Threshold Amplitude: 0.625 V
Lead Channel Pacing Threshold Pulse Width: 0.6 ms
Lead Channel Sensing Intrinsic Amplitude: 13 mV
Lead Channel Sensing Intrinsic Amplitude: 13 mV
Lead Channel Sensing Intrinsic Amplitude: 4.125 mV
Lead Channel Sensing Intrinsic Amplitude: 4.125 mV
Lead Channel Sensing Intrinsic Amplitude: 13 mV
Lead Channel Setting Pacing Amplitude: 1.75 V
Lead Channel Setting Pacing Amplitude: 2.5 V
Lead Channel Setting Pacing Amplitude: 1.75 V
Lead Channel Setting Pacing Amplitude: 2.5 V
Lead Channel Setting Pacing Pulse Width: 0.6 ms
Lead Channel Setting Pacing Pulse Width: 0.6 ms
Lead Channel Setting Pacing Pulse Width: 0.6 ms
Lead Channel Setting Pacing Pulse Width: 0.6 ms
Lead Channel Setting Sensing Sensitivity: 0.3 mV
Lead Channel Setting Sensing Sensitivity: 0.3 mV

## 2019-08-05 ENCOUNTER — Ambulatory Visit (INDEPENDENT_AMBULATORY_CARE_PROVIDER_SITE_OTHER): Payer: Medicare Other

## 2019-08-05 DIAGNOSIS — Z95810 Presence of automatic (implantable) cardiac defibrillator: Secondary | ICD-10-CM | POA: Diagnosis not present

## 2019-08-05 DIAGNOSIS — I5022 Chronic systolic (congestive) heart failure: Secondary | ICD-10-CM | POA: Diagnosis not present

## 2019-08-07 NOTE — Progress Notes (Signed)
EPIC Encounter for ICM Monitoring  Patient Name: KARRIEM MUENCH is a 73 y.o. male Date: 08/07/2019 Primary Care Physican: [name], MD Primary [place] Electrophysiologist: Vergie Living Pacing: 98.3% LastWeight:213.2 - 215.6 lbs  Battery Longevity: ~20months   Spoke with patient and he is doing well.    Optivol thoracic impedancenormal.  Prescribed: Furosemide40 mgTake 2 tablets ( 80 mg ) in am and 1 tablet ( 40 mg ) inPM.Potassium 20 mEqTAKE 1 TABLET BY MOUTH DAILY. TAKE ADDITIONAL TABLET WHEN TAKING EXTRA LASIX  Labs: 12/06/2018 Creatinine 1.79, BUN 30, Potassium 3.7, Sodium 146, GFR 37-43 07/12/2018 Creatinine 1.64, BUN 30, Potassium 4.1, Sodium 144, GFR 42-48 05/17/2018 Creatinine 1.65, BUN 31, Potassium 4.4, Sodium 146, GFR 41-48, BNP 268.9  Recommendations:No changes and encouraged to call if experiencing any fluid symptoms.  Follow-up plan: ICM clinic phone appointment on10/11/2019.Office appt 10/10/2019 withDr Martinique.   Copy of ICM check sent to [place].   3 month ICM trend: 08/05/2019    1 Year ICM trend:       [name], RN 08/07/2019 3:00 PM

## 2019-08-12 ENCOUNTER — Encounter: Payer: Self-pay | Admitting: Cardiology

## 2019-08-12 NOTE — Progress Notes (Signed)
Remote ICD transmission.   

## 2019-08-30 ENCOUNTER — Telehealth: Payer: Self-pay

## 2019-08-30 NOTE — Telephone Encounter (Signed)
Disability parking placard completed and mailed to patient.

## 2019-09-10 ENCOUNTER — Other Ambulatory Visit: Payer: Self-pay | Admitting: Cardiology

## 2019-09-30 ENCOUNTER — Ambulatory Visit (INDEPENDENT_AMBULATORY_CARE_PROVIDER_SITE_OTHER): Payer: Medicare Other

## 2019-09-30 DIAGNOSIS — I5022 Chronic systolic (congestive) heart failure: Secondary | ICD-10-CM

## 2019-09-30 DIAGNOSIS — Z95810 Presence of automatic (implantable) cardiac defibrillator: Secondary | ICD-10-CM

## 2019-10-02 NOTE — Progress Notes (Signed)
EPIC Encounter for ICM Monitoring  [name] is a 73 y.o. male Date: 10/02/2019 Primary Care Physican: [name], MD Primary [place] Electrophysiologist: Vergie Living Pacing: 97.7% 07/10/2019 Weight:221 lbs (office visit)  Battery Longevity: ~4months   Transmission reviewed.  Optivol thoracic impedancenormal.  Prescribed: Furosemide40 mgTake 2 tablets ( 80 mg ) in am and 1 tablet ( 40 mg ) inPM.Potassium 20 mEqTAKE 1 TABLET BY MOUTH DAILY. TAKE ADDITIONAL TABLET WHEN TAKING EXTRA LASIX  Labs: 07/10/2019 Creatinine 1.39, BUN 25, Potassium 3.7, Sodium 144, GFR 51-59  A complete set of results can be found in Results Review.  Recommendations: None  Follow-up plan: ICM clinic phone appointment on 11/04/2019.   91 day device clinic remote transmission 10/31/2019.  Office appt 10/10/2019 with Dr. Martinique.    Copy of ICM check sent to Dr. Caryl Comes.   3 month ICM trend: 09/30/2019    1 Year ICM trend:       [name], RN 10/02/2019 11:27 AM

## 2019-10-03 NOTE — Progress Notes (Signed)
Cardiology Office Note    Date:  10/10/2019   ID:  [name], [name] 10-16-1946, MRN [id number]  PCP:  [name] Major, MD  Cardiologist:  Dr.  Martinique   Chief Complaint  Patient presents with  . Follow-up    3 months.  . Congestive Heart Failure    History of Present Illness:  [name] is a 73 y.o. male with PMH of CAD s/p remote anterior MI, ICM s/p CRT-D, atrial fibrillation, and HLD. He had generator change placed in September 2014. He is intolerant of amiodarone. His QT interval was too long for Tikosyn. He previously attempted to increase his carvedilol dose, however developed significant hypotension.   In October 2017, he had volume overloaded indicated by his Optival reading, Lasix was increased to 40 mg twice a day. He was started on Entresto but developed hypotension, he was later transitioned back to losartan. Due to persistent atrial fibrillation with elevated heart rate, he underwent AV nodal ablation in February 2018.  He was admitted to the hospital on 09/30/2017 with renal insufficiency. He reported episode of weakness. He was also treated for SIRS and sepsis with temp 102 and tachypnea. White blood cell count was 16.6. Initial lactic acid 2.14. He was hydrated with IV fluid. Troponin was borderline elevated, this was felt to be related to elevation of creatinine up to 2.71. He was treated with vancomycin, Levaquin and aztreonam. Culture was negative. Repeat echocardiogram obtained on 10/01/2017 continues showed EF 25-30%, mild MR, mild-to-moderate TR, akinesis of the apical anterior, lateral, inferolateral, inferior, apical myocardium with no evidence of apical thrombus by Definity contrast, PA peak pressure 37 mmHg.  He was seen in July with some fluid accumulation. Lasix increased for 3 days. Last ICD check 09/30/19 showed normal Optivol. He is approaching ERI.   On follow up today he is doing well. He monitors his weight daily. Rarely has to take extra  lasix. Wears support hose. No chest pain, dyspnea, palpitations.   Coumadin checks have been ok.   Past Medical History:  Diagnosis Date  . AICD (automatic cardioverter/defibrillator) present   . Anemia   . Anxiety   . CHF (congestive heart failure) ([name])   . Chronic renal insufficiency   . Coronary artery disease   . Depression   . Dyslipidemia   . Dysrhythmia    atrial fibrillation  . ED (erectile dysfunction)   . GERD (gastroesophageal reflux disease)   . Incisional hernia, without obstruction or gangrene 04/10/2017  . Ischemic cardiomyopathy    EF 23%  . MI (myocardial infarction) ([name]) 2000   ANTERIOR, s/p PCI  . Other primary cardiomyopathies   . Peripheral vascular disease ([name])    dvt legs 2017  . Persistent atrial fibrillation ([name])    on coumadin  . Presence of permanent cardiac pacemaker   . Shortness of breath dyspnea    on exertion    Past Surgical History:  Procedure Laterality Date  . AV NODE ABLATION N/A 01/25/2017   Procedure: AV Node Ablation;  Surgeon: [name], MD;  Location: [place] [facility];  Service: Cardiovascular;  Laterality: N/A;  . AV NODE ABLATION  01/25/2017  . BIV ICD GENERTAOR CHANGE OUT N/A 09/16/2013   Procedure: BIV ICD GENERTAOR CHANGE OUT;  Surgeon: [name], MD;  Location: [facility] CATH LAB;  Service: Cardiovascular;  Laterality: N/A;  . BREAST LUMPECTOMY Left   . CARDIAC CATHETERIZATION  02/05/2008   MODERATE TO SEVERE LEFT VENTRICULAR  DYSFUNCTION WITH EF 25-30%  . CARDIAC DEFIBRILLATOR PLACEMENT  2009   MDT BI-VENTRICULAR ICD by Dr Caryl Comes  . COLONOSCOPY WITH PROPOFOL N/A 08/31/2016   Procedure: COLONOSCOPY WITH PROPOFOL;  Surgeon: Wilford Corner, MD;  Location: [facility] ENDOSCOPY;  Service: Endoscopy;  Laterality: N/A;  . CORONARY ANGIOPLASTY  2000   stent  . CORONARY STENT PLACEMENT     LAD  . CYSTOSCOPY N/A 01/25/2016   Procedure: CYSTOSCOPY;  Surgeon: Carolan Clines, MD;  [facility];  Service: Urology;  Laterality:  N/A;  . ESOPHAGOGASTRODUODENOSCOPY (EGD) WITH PROPOFOL N/A 08/31/2016   Procedure: ESOPHAGOGASTRODUODENOSCOPY (EGD) WITH PROPOFOL;  Surgeon: Wilford Corner, MD;  Location: [facility] ENDOSCOPY;  Service: Endoscopy;  Laterality: N/A;  . FRACTURE SURGERY Right 1972   leg  . IMPLANTABLE CARDIOVERTER DEFIBRILLATOR GENERATOR CHANGE  2014  . INCISIONAL HERNIA REPAIR N/A 04/10/2017   Procedure: OPEN REPAIR INCISIONAL VENTRAL HERNIA;  Surgeon: [name], MD;  Location: [place];  Service: General;  Laterality: N/A;  . INSERT / REPLACE / REMOVE PACEMAKER     ICD  . INSERTION OF MESH N/A 04/10/2017   Procedure: INSERTION OF MESH;  Surgeon: [name], MD;  Location: [place];  Service: General;  Laterality: N/A;  . LAPAROTOMY N/A 02/22/2016   Procedure: EXPLORATORY LAPAROTOMY WITH  PATCH OF DUODENAL ULCER;  Surgeon: Rolm Bookbinder, MD;  Location: [place];  Service: General;  Laterality: N/A;  . MULTIPLE TOOTH EXTRACTIONS    . TRANSTHORACIC ECHOCARDIOGRAM  12/2010   EF 30-35%  . TRANSURETHRAL RESECTION OF PROSTATE N/A 01/25/2016   Procedure: TRANSURETHRAL RESECTION OF THE PROSTATE (TURP);  Surgeon: Carolan Clines, MD;  [facility];  Service: Urology;  Laterality: N/A;  . WRIST FRACTURE SURGERY Left 1972    Current Medications: Outpatient Medications Prior to Visit  Medication Sig Dispense Refill  . acetaminophen (TYLENOL) 500 MG tablet Take 500 mg by mouth every 8 (eight) hours as needed for mild pain.    Marland Kitchen digoxin (LANOXIN) 0.125 MG tablet Take 1/2 tablet every other day 90 tablet 3  . fenofibrate 160 MG tablet TAKE 1 TABLET BY MOUTH  DAILY 90 tablet 1  . ferrous sulfate 325 (65 FE) MG tablet Take 325 mg by mouth 2 (two) times daily.    . folic acid (FOLVITE) Q000111Q MCG tablet Take 800 mcg by mouth daily.     . furosemide (LASIX) 40 MG tablet Take 2 tablets ( 80 mg ) in am and 1 tablet ( 40 mg ) in pm. 270 tablet 3  . losartan (COZAAR) 25 MG tablet TAKE 1 TABLET BY MOUTH  DAILY 90 tablet 1  .  metoprolol succinate (TOPROL-XL) 25 MG 24 hr tablet TAKE 1 TABLET BY MOUTH  DAILY AT 12 NOON. 90 tablet 1  . Multiple Vitamins-Minerals (MULTIVITAMIN ADULTS PO) Take 1 tablet by mouth daily.    . pantoprazole (PROTONIX) 40 MG tablet TAKE 2 TABLETS BY MOUTH  DAILY 180 tablet 3  . potassium chloride SA (K-DUR,KLOR-CON) 20 MEQ tablet TAKE 1 TABLET BY MOUTH  DAILY. TAKE ADDITIONAL  TABLET WHEN TAKING EXTRA  LASIX 135 tablet 1  . pravastatin (PRAVACHOL) 40 MG tablet TAKE 1 TABLET BY MOUTH  DAILY 90 tablet 3  . sertraline (ZOLOFT) 100 MG tablet Take 100 mg by mouth daily.      . vitamin C (ASCORBIC ACID) 250 MG tablet Take 250 mg by mouth 2 (two) times daily.    Marland Kitchen warfarin (COUMADIN) 5 MG tablet Take 0.5 tablets (2.5 mg total)  by mouth daily at 6 PM. (Patient taking differently: Take 1 mg by mouth daily at 6 PM. Taking 3 mg) 30 tablet 0  . nitroGLYCERIN (NITROSTAT) 0.4 MG SL tablet Place 1 tablet (0.4 mg total) under the tongue every 5 (five) minutes as needed for chest pain. 25 tablet 11   No facility-administered medications prior to visit.      Allergies:   Keflex [cephalexin]   Social History   Socioeconomic History  . Marital status: Married    Spouse name: Not on file  . Number of children: 2  . Years of education: Not on file  . Highest education level: Not on file  Occupational History  . Occupation: disabled    Employer: RETIRED  Social Needs  . Financial resource strain: Not on file  . Food insecurity    Worry: Not on file    Inability: Not on file  . Transportation needs    Medical: Not on file    Non-medical: Not on file  Tobacco Use  . Smoking status: Former Smoker    Types: Cigarettes    Quit date: 12/23/1998    Years since quitting: 20.8  . Smokeless tobacco: Never Used  Substance and Sexual Activity  . Alcohol use: No  . Drug use: No  . Sexual activity: Not on file  Lifestyle  . Physical activity    Days per week: Not on file    Minutes per session: Not on file   . Stress: Not on file  Relationships  . Social Herbalist on phone: Not on file    Gets together: Not on file    Attends religious service: Not on file    Active member of club or organization: Not on file    Attends meetings of clubs or organizations: Not on file    Relationship status: Not on file  Other Topics Concern  . Not on file  Social History Narrative   Lives in [place].  Retired Teacher, English as a foreign language           Family History:  The patient's family history includes Breast cancer in his mother and sister; Depression in his mother; Heart attack in his father.   ROS:   Please see the history of present illness.    ROS All other systems reviewed and are negative.   PHYSICAL EXAM:   VS:  BP 124/70 (BP Location: Left Arm, Patient Position: Sitting, Cuff Size: Normal)   Pulse 80   Temp (!) 97.1 F (36.2 C)   Ht 5\' 10"  (1.778 m)   Wt 217 lb (98.4 kg)   BMI 31.14 kg/m    GENERAL:  Well appearing overweight WM in NAD.  HEENT:  PERRL, EOMI, sclera are clear. Oropharynx is clear. NECK:  No jugular venous distention, carotid upstroke brisk and symmetric, no bruits, no thyromegaly or adenopathy LUNGS:  Clear to auscultation bilaterally CHEST:  Unremarkable HEART:  RRR,  PMI not displaced or sustained,S1 and S2 within normal limits, no S3, no S4: no clicks, no rubs, no murmurs ABD:  Soft, nontender. BS +, no masses or bruits. No hepatomegaly, no splenomegaly EXT:  2 + pulses throughout, tr edema, no cyanosis no clubbing. Compression hose in place. SKIN:  Warm and dry.  No rashes NEURO:  Alert and oriented x 3. Cranial nerves II through XII intact. PSYCH:  Cognitively intact   Wt Readings from Last 3 Encounters:  10/10/19 217 lb (98.4 kg)  07/10/19 221 lb (100.2 kg)  01/22/19 216 lb 6 oz (98.1 kg)      Studies/Labs Reviewed:   EKG:  EKG is not ordered today.     Recent Labs: 12/13/2018: Hemoglobin 12.0; Platelets 271 07/10/2019: BUN 25; Creatinine, Ser  1.39; Potassium 3.7; Sodium 144   Lipid Panel    Component Value Date/Time   CHOL 112 02/27/2016 0500   TRIG 53 02/29/2016 0509   HDL 32 (L) 02/27/2016 0500   CHOLHDL 3.5 02/27/2016 0500   VLDL 14 02/27/2016 0500   LDLCALC 66 02/27/2016 0500   LDLDIRECT 137.6 01/06/2012 1029   Labs dated 05/01/18: BUN 41, creatinine 1.72. Other chemistries normal. Dig level 0.2. WBC 7.7. Hgb 11.8. Dated 10/08/18: INR 2.3. Hgb 11.8. BUN 32, creatinine 1.48. Other chemistries normal.  Dated 12/20/18: Dig level < 0.4. Dated 04/29/19: Hgb 12.1. BUN 30, creatinine 1.47. other chemistries normal  Additional studies/ records that were reviewed today include:   Echo 10/01/2017 LV EF: 25% -   30%  Study Conclusions  - Left ventricle: The cavity size was normal. Systolic function was   severely reduced. The estimated ejection fraction was in the   range of 25% to 30%. There is akinesis of the apical anterior,   lateral, inferolateral, inferior, and apical myocardium with no   evidence of apical thrombus by definity contrast. The study is   not technically sufficient to allow evaluation of LV diastolic   function. Doppler parameters are consistent with high ventricular   filling pressure. - Mitral valve: There was mild regurgitation. - Left atrium: The atrium was mildly dilated. - Tricuspid valve: There was mild-moderate regurgitation. - Pulmonic valve: There was trivial regurgitation. - Pulmonary arteries: PA peak pressure: 37 mm Hg (S).  Impressions:  - The right ventricular systolic pressure was increased consistent   with mild pulmonary hypertension.   ASSESSMENT:    1. Chronic systolic heart failure ([place])   2. Chronic atrial fibrillation ([name])   3. Ischemic cardiomyopathy   4. CAD S/P LAD PCI- 2000   5. Essential hypertension   6. Hyperlipidemia, unspecified hyperlipidemia type      PLAN:  In order of problems listed above:  1. Chronic systolic heart failure: EF 25-30%. weight is  down. Looks well compensated on lasix 80 mg in am and 40 mg in the pm.  Last Optivol OK.  Instructed on importance of sodium and fluid restriction. Will continue his other therapy.  2. ICM s/p ICD:   Device check on October 12 was good with BiV pacing.   3. CAD: Denies any anginal symptoms.  4. Hyperlipidemia: On Pravachol 40 mg daily.  5. Persistent atrial fibrillation: He had AV nodal ablation in early 2018.  Continue Coumadin.   6. CKD stage 3.     Medication Adjustments/Labs and Tests Ordered: Current medicines are reviewed at length with the patient today.  Concerns regarding medicines are outlined above.  Medication changes, Labs and Tests ordered today are listed in the Patient Instructions below. There are no Patient Instructions on file for this visit.   Signed,  [place], MD  10/10/2019 11:25 AM    [facility] [address], [place], [place]  [address] Phone: [phone]; Fax: [phone]

## 2019-10-10 ENCOUNTER — Ambulatory Visit: Payer: Medicare Other | Admitting: Cardiology

## 2019-10-10 ENCOUNTER — Other Ambulatory Visit: Payer: Self-pay

## 2019-10-10 ENCOUNTER — Encounter: Payer: Self-pay | Admitting: Cardiology

## 2019-10-10 VITALS — BP 124/70 | HR 80 | Temp 97.1°F | Ht 70.0 in | Wt 217.0 lb

## 2019-10-10 DIAGNOSIS — Z9861 Coronary angioplasty status: Secondary | ICD-10-CM

## 2019-10-10 DIAGNOSIS — I2510 Atherosclerotic heart disease of native coronary artery without angina pectoris: Secondary | ICD-10-CM | POA: Diagnosis not present

## 2019-10-10 DIAGNOSIS — I5022 Chronic systolic (congestive) heart failure: Secondary | ICD-10-CM | POA: Diagnosis not present

## 2019-10-10 DIAGNOSIS — I255 Ischemic cardiomyopathy: Secondary | ICD-10-CM | POA: Diagnosis not present

## 2019-10-10 DIAGNOSIS — I10 Essential (primary) hypertension: Secondary | ICD-10-CM

## 2019-10-10 DIAGNOSIS — I482 Chronic atrial fibrillation, unspecified: Secondary | ICD-10-CM

## 2019-10-10 DIAGNOSIS — E785 Hyperlipidemia, unspecified: Secondary | ICD-10-CM

## 2019-10-22 ENCOUNTER — Other Ambulatory Visit: Payer: Self-pay | Admitting: Cardiology

## 2019-10-22 DIAGNOSIS — I5022 Chronic systolic (congestive) heart failure: Secondary | ICD-10-CM

## 2019-10-31 ENCOUNTER — Ambulatory Visit (INDEPENDENT_AMBULATORY_CARE_PROVIDER_SITE_OTHER): Payer: Medicare Other | Admitting: *Deleted

## 2019-10-31 DIAGNOSIS — I4819 Other persistent atrial fibrillation: Secondary | ICD-10-CM

## 2019-10-31 DIAGNOSIS — I420 Dilated cardiomyopathy: Secondary | ICD-10-CM

## 2019-10-31 LAB — CUP PACEART REMOTE DEVICE CHECK
Battery Remaining Longevity: 5 mo
Battery Voltage: 2.8 V
Brady Statistic AP VP Percent: 0 %
Brady Statistic AP VS Percent: 0 %
Brady Statistic AS VP Percent: 98.69 %
Brady Statistic AS VS Percent: 1.31 %
Brady Statistic RA Percent Paced: 0 %
Brady Statistic RV Percent Paced: 98.15 %
Date Time Interrogation Session: 20201112094225
HighPow Impedance: 47 Ohm
HighPow Impedance: 53 Ohm
Implantable Lead Implant Date: 20090226
Implantable Lead Implant Date: 20090226
Implantable Lead Implant Date: 20090226
Implantable Lead Location: 753858
Implantable Lead Location: 753859
Implantable Lead Location: 753860
Implantable Lead Model: 4194
Implantable Lead Model: 5076
Implantable Lead Model: 6947
Implantable Pulse Generator Implant Date: 20140929
Lead Channel Impedance Value: 285 Ohm
Lead Channel Impedance Value: 323 Ohm
Lead Channel Impedance Value: 380 Ohm
Lead Channel Impedance Value: 551 Ohm
Lead Channel Impedance Value: 570 Ohm
Lead Channel Impedance Value: 722 Ohm
Lead Channel Pacing Threshold Amplitude: 0.625 V
Lead Channel Pacing Threshold Pulse Width: 0.6 ms
Lead Channel Sensing Intrinsic Amplitude: 13 mV
Lead Channel Sensing Intrinsic Amplitude: 13 mV
Lead Channel Sensing Intrinsic Amplitude: 3.875 mV
Lead Channel Sensing Intrinsic Amplitude: 3.875 mV
Lead Channel Setting Pacing Amplitude: 1.75 V
Lead Channel Setting Pacing Amplitude: 2.5 V
Lead Channel Setting Pacing Pulse Width: 0.6 ms
Lead Channel Setting Pacing Pulse Width: 0.6 ms
Lead Channel Setting Sensing Sensitivity: 0.3 mV

## 2019-11-04 ENCOUNTER — Ambulatory Visit (INDEPENDENT_AMBULATORY_CARE_PROVIDER_SITE_OTHER): Payer: Medicare Other

## 2019-11-04 DIAGNOSIS — Z95810 Presence of automatic (implantable) cardiac defibrillator: Secondary | ICD-10-CM | POA: Diagnosis not present

## 2019-11-04 DIAGNOSIS — I5022 Chronic systolic (congestive) heart failure: Secondary | ICD-10-CM | POA: Diagnosis not present

## 2019-11-06 ENCOUNTER — Other Ambulatory Visit: Payer: Self-pay | Admitting: Cardiology

## 2019-11-06 NOTE — Progress Notes (Signed)
EPIC Encounter for ICM Monitoring  Patient Name: WIATT HARKRADER is a 73 y.o. male Date: 11/06/2019 Primary Care Physican: [name], MD Primary [place] Electrophysiologist: Vergie Living Pacing: 98.6% 11/06/2019 [id number] - 215 lbs  Battery Longevity: ~62months   Spoke with patient and he is asymptomatic for fluid accumulation.    Optivol thoracic impedancenormal.  Prescribed: Furosemide40 mgTake 2 tablets ( 80 mg ) in am and 1 tablet ( 40 mg ) inPM.Potassium 20 mEqTAKE 1 TABLET BY MOUTH DAILY. TAKE ADDITIONAL TABLET WHEN TAKING EXTRA LASIX  Labs: 07/10/2019 Creatinine 1.39, BUN 25, Potassium 3.7, Sodium 144  Recommendations: No changes and encouraged to call if experiencing any fluid symptoms.  Follow-up plan: ICM clinic phone appointment on 12/09/2019.  Office appt 12/17/2019 with Dr. Caryl Comes.    Copy of ICM check sent to Dr. Caryl Comes.   3 month ICM trend: 11/04/2019    1 Year ICM trend:       [name], RN 11/06/2019 9:43 AM

## 2019-11-22 NOTE — Progress Notes (Signed)
Remote ICD transmission.   

## 2019-12-09 ENCOUNTER — Ambulatory Visit (INDEPENDENT_AMBULATORY_CARE_PROVIDER_SITE_OTHER): Payer: Medicare Other

## 2019-12-09 DIAGNOSIS — Z95810 Presence of automatic (implantable) cardiac defibrillator: Secondary | ICD-10-CM

## 2019-12-09 DIAGNOSIS — I5022 Chronic systolic (congestive) heart failure: Secondary | ICD-10-CM

## 2019-12-10 NOTE — Progress Notes (Signed)
EPIC Encounter for ICM Monitoring  [name] is a 73 y.o. male Date: 12/10/2019 Primary Care Physican: [name] Major, MD Primary [place] Electrophysiologist: [name] Pacing: 98.4% 11/06/2019 [id number] - 215 lbs  Battery Longevity: ~19months   Transmission reviewed.  Optivol thoracic impedancenormal.  Prescribed: Furosemide40 mgTake 2 tablets ( 80 mg ) in am and 1 tablet ( 40 mg ) inPM.Potassium 20 mEqTAKE 1 TABLET BY MOUTH DAILY. TAKE ADDITIONAL TABLET WHEN TAKING EXTRA LASIX  Labs: 07/10/2019 Creatinine 1.39, BUN 25, Potassium 3.7, Sodium 144  Recommendations:  None  Follow-up plan: ICM clinic phone appointment on 01/12/2019.   91 day device clinic remote transmission 01/30/2020.  Office appt 12/17/2019 with Dr. Caryl [name].    Copy of ICM check sent to Dr. Caryl [name].   3 month ICM trend: 12/09/2019    1 Year ICM trend:       [name], RN 12/10/2019 1:47 PM

## 2019-12-11 NOTE — Telephone Encounter (Signed)
error 

## 2019-12-17 ENCOUNTER — Other Ambulatory Visit: Payer: Self-pay

## 2019-12-17 ENCOUNTER — Encounter: Payer: Self-pay | Admitting: Internal Medicine

## 2019-12-17 ENCOUNTER — Ambulatory Visit: Payer: Medicare Other | Admitting: Internal Medicine

## 2019-12-17 VITALS — BP 128/72 | HR 81 | Ht 70.0 in | Wt 223.2 lb

## 2019-12-17 DIAGNOSIS — Z95810 Presence of automatic (implantable) cardiac defibrillator: Secondary | ICD-10-CM

## 2019-12-17 DIAGNOSIS — I5022 Chronic systolic (congestive) heart failure: Secondary | ICD-10-CM | POA: Diagnosis not present

## 2019-12-17 DIAGNOSIS — I4821 Permanent atrial fibrillation: Secondary | ICD-10-CM

## 2019-12-17 NOTE — Patient Instructions (Addendum)
Medication Instructions:  Your physician recommends that you continue on your current medications as directed. Please refer to the Current Medication list given to you today.   *If you need a refill on your cardiac medications before your next appointment, please call your pharmacy*  Lab Work: None ordered.  If you have labs (blood work) drawn today and your tests are completely normal, you will receive your results only by: Marland Kitchen MyChart Message (if you have MyChart) OR . A paper copy in the mail If you have any lab test that is abnormal or we need to change your treatment, we will call you to review the results.  Testing/Procedures: None ordered.   Follow-Up: At [facility], you and your health needs are our priority.  As part of our continuing mission to provide you with exceptional heart care, we have created designated Provider Care Teams.  These Care Teams include your primary Cardiologist (physician) and Advanced Practice Providers (APPs -  Physician Assistants and Nurse Practitioners) who all work together to provide you with the care you need, when you need it.  Your next appointment:  March 16,2021 at 2:15pm

## 2019-12-17 NOTE — Progress Notes (Signed)
Patient Care Team: [name], MD as PCP - General (Family Medicine) [place], Peter M, MD as PCP - Cardiology (Cardiology) [name], MD as PCP - Electrophysiology (Cardiology) [name], MD as Consulting Physician (Cardiology)   HPI  [name] is a 73 y.o. male Seen in followup of afib for which he recently saw Dr Greggory Brandy and the was decision to not pursue P VI He also has ichemic cardiomyopathy and HFrEF with prior CRT-D from which he garnered little benefit. CXR demonstrates good lead location  We had spent many months deciding what to do about rate control and finally underwent AV junction ablation 2/18   Hospitalized 3/17 for GI bleed 2/2 ulcers requring surgery for perforation  now back on warfain  Recently underwent incisional hernia repair    The patient denies chest pain, shortness of breath, nocturnal dyspnea, orthopnea or peripheral edema.  There have been no palpitations, lightheadedness or syncope.   Followed by [facility] heart failure   DATE TEST EF   1/17    Echo  20.25 %   10/18 Echo  25-30%      Date Cr K Dig Hgb  4/18 1.43 3.4    11/18 1.42 4.6    12/19 1.79 3.7 (0.4) 12  7/20 1.39 3.7  12.0  He has moved to [place]. And loves it    Past Medical History:  Diagnosis Date  . AICD (automatic cardioverter/defibrillator) present   . Anemia   . Anxiety   . CHF (congestive heart failure) ([place])   . Chronic renal insufficiency   . Coronary artery disease   . Depression   . Dyslipidemia   . Dysrhythmia    atrial fibrillation  . ED (erectile dysfunction)   . GERD (gastroesophageal reflux disease)   . Incisional hernia, without obstruction or gangrene 04/10/2017  . Ischemic cardiomyopathy    EF 23%  . MI (myocardial infarction) ([place]) 2000   ANTERIOR, s/p PCI  . Other primary cardiomyopathies   . Peripheral vascular disease ([place])    dvt legs 2017  . Persistent atrial fibrillation ([facility])    on coumadin  . Presence of permanent  cardiac pacemaker   . Shortness of breath dyspnea    on exertion    Past Surgical History:  Procedure Laterality Date  . AV NODE ABLATION N/A 01/25/2017   Procedure: AV Node Ablation;  Surgeon: [name], MD;  Location: [place] [facility];  Service: Cardiovascular;  Laterality: N/A;  . AV NODE ABLATION  01/25/2017  . BIV ICD GENERTAOR CHANGE OUT N/A 09/16/2013   Procedure: BIV ICD GENERTAOR CHANGE OUT;  Surgeon: [name], MD;  Location: [facility] CATH LAB;  Service: Cardiovascular;  Laterality: N/A;  . BREAST LUMPECTOMY Left   . CARDIAC CATHETERIZATION  02/05/2008   MODERATE TO SEVERE LEFT VENTRICULAR  DYSFUNCTION WITH EF 25-30%  . CARDIAC DEFIBRILLATOR PLACEMENT  2009   MDT BI-VENTRICULAR ICD by Dr Caryl Comes  . COLONOSCOPY WITH PROPOFOL N/A 08/31/2016   Procedure: COLONOSCOPY WITH PROPOFOL;  Surgeon: Wilford Corner, MD;  Location: [facility] ENDOSCOPY;  Service: Endoscopy;  Laterality: N/A;  . CORONARY ANGIOPLASTY  2000   stent  . CORONARY STENT PLACEMENT     LAD  . CYSTOSCOPY N/A 01/25/2016   Procedure: CYSTOSCOPY;  Surgeon: Carolan Clines, MD;  [facility];  Service: Urology;  Laterality: N/A;  . ESOPHAGOGASTRODUODENOSCOPY (EGD) WITH PROPOFOL N/A 08/31/2016   Procedure: ESOPHAGOGASTRODUODENOSCOPY (EGD) WITH PROPOFOL;  Surgeon: Evette Doffing  Michail Sermon, MD;  Location: [place];  Service: Endoscopy;  Laterality: N/A;  . FRACTURE SURGERY Right 1972   leg  . IMPLANTABLE CARDIOVERTER DEFIBRILLATOR GENERATOR CHANGE  2014  . INCISIONAL HERNIA REPAIR N/A 04/10/2017   Procedure: OPEN REPAIR INCISIONAL VENTRAL HERNIA;  Surgeon: [name], MD;  Location: [place];  Service: General;  Laterality: N/A;  . INSERT / REPLACE / REMOVE PACEMAKER     ICD  . INSERTION OF MESH N/A 04/10/2017   Procedure: INSERTION OF MESH;  Surgeon: [name], MD;  Location: [place];  Service: General;  Laterality: N/A;  . LAPAROTOMY N/A 02/22/2016   Procedure: EXPLORATORY LAPAROTOMY WITH  PATCH OF DUODENAL ULCER;   Surgeon: Rolm Bookbinder, MD;  Location: [place];  Service: General;  Laterality: N/A;  . MULTIPLE TOOTH EXTRACTIONS    . TRANSTHORACIC ECHOCARDIOGRAM  12/2010   EF 30-35%  . TRANSURETHRAL RESECTION OF PROSTATE N/A 01/25/2016   Procedure: TRANSURETHRAL RESECTION OF THE PROSTATE (TURP);  Surgeon: Carolan Clines, MD;  [facility];  Service: Urology;  Laterality: N/A;  . WRIST FRACTURE SURGERY Left 1972    Current Outpatient Medications  Medication Sig Dispense Refill  . acetaminophen (TYLENOL) 500 MG tablet Take 500 mg by mouth every 8 (eight) hours as needed for mild pain.    Marland Kitchen digoxin (LANOXIN) 0.125 MG tablet Take 1/2 tablet every other day 90 tablet 3  . fenofibrate 160 MG tablet TAKE 1 TABLET BY MOUTH  DAILY 90 tablet 1  . ferrous sulfate 325 (65 FE) MG tablet Take 325 mg by mouth 2 (two) times daily.    . folic acid (FOLVITE) Q000111Q MCG tablet Take 800 mcg by mouth daily.     . furosemide (LASIX) 40 MG tablet TAKE 2 TABLETS BY MOUTH IN  THE MORNING AND 1 TABLET IN THE EVENING 270 tablet 1  . losartan (COZAAR) 25 MG tablet TAKE 1 TABLET BY MOUTH  DAILY 90 tablet 1  . metoprolol succinate (TOPROL-XL) 25 MG 24 hr tablet TAKE 1 TABLET BY MOUTH  DAILY AT 12 NOON. 90 tablet 1  . Multiple Vitamins-Minerals (MULTIVITAMIN ADULTS PO) Take 1 tablet by mouth daily.    . nitroGLYCERIN (NITROSTAT) 0.4 MG SL tablet Place 1 tablet (0.4 mg total) under the tongue every 5 (five) minutes as needed for chest pain. 25 tablet 11  . pantoprazole (PROTONIX) 40 MG tablet TAKE 2 TABLETS BY MOUTH  DAILY 180 tablet 3  . potassium chloride SA (KLOR-CON) 20 MEQ tablet TAKE 1 TABLET BY MOUTH  DAILY. TAKE ADDITIONAL  TABLET WHEN TAKING EXTRA  LASIX (FUROSEMIDE) 135 tablet 3  . pravastatin (PRAVACHOL) 40 MG tablet TAKE 1 TABLET BY MOUTH  DAILY 90 tablet 3  . sertraline (ZOLOFT) 100 MG tablet Take 150 mg by mouth daily.     . vitamin C (ASCORBIC ACID) 250 MG tablet Take 250 mg by mouth 2 (two) times daily.    Marland Kitchen  warfarin (COUMADIN) 5 MG tablet Take 5 mg by mouth daily. Used as directed     No current facility-administered medications for this visit.    Allergies  Allergen Reactions  . Keflex [Cephalexin] Shortness Of Breath and Other (See Comments)    dizziness    Review of Systems negative except from HPI and PMH  Physical Exam BP 128/72   Pulse 81   Ht 5\' 10"  (1.778 m)   Wt 223 lb 3.2 oz (101.2 kg)   SpO2 97%   BMI 32.03 kg/m  Well developed and  well nourished in no acute distress HENT normal Neck supple with JVP-flat Clear Device pocket well healed; without hematoma or erythema.  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  The patient's device was interrogated.  The information was reviewed. No changes were made in the programming.          Device is approaching ERI now in 3 months.  He will be alerted  We have reviewed the procedure  Without symptoms of ischemia  Euvolemic continue current meds

## 2020-01-08 ENCOUNTER — Other Ambulatory Visit: Payer: Self-pay | Admitting: Cardiology

## 2020-01-13 ENCOUNTER — Ambulatory Visit (INDEPENDENT_AMBULATORY_CARE_PROVIDER_SITE_OTHER): Payer: Medicare Other

## 2020-01-13 DIAGNOSIS — I5022 Chronic systolic (congestive) heart failure: Secondary | ICD-10-CM | POA: Diagnosis not present

## 2020-01-13 DIAGNOSIS — Z95810 Presence of automatic (implantable) cardiac defibrillator: Secondary | ICD-10-CM | POA: Diagnosis not present

## 2020-01-15 NOTE — Progress Notes (Signed)
EPIC Encounter for ICM Monitoring  [name] is a 74 y.o. male Date: 01/15/2020 Primary Care Physican: [name], MD Primary [place] Electrophysiologist: Vergie Living Pacing: 98.5% 01/15/2020 [id number] lbs  Battery Longevity: ~3 months   Spoke with patient and he is doing well.    Optivol thoracic impedancenormal.  Prescribed:   Furosemide40 mgTake 2 tablets ( 80 mg ) in am and 1 tablet ( 40 mg ) inPM.  Potassium 20 mEqTAKE 1 TABLET BY MOUTH DAILY. TAKE ADDITIONAL TABLET WHEN TAKING EXTRA LASIX  Labs: 07/10/2019 Creatinine 1.39, BUN 25, Potassium 3.7, Sodium 144  Recommendations:  No changes and encouraged to call if experiencing any fluid symptoms.  Follow-up plan: ICM clinic phone appointment on 02/17/2019.   91 day device clinic remote transmission 01/30/2020.   Office appt 03/03/2020 with Dr. Caryl Comes.    Copy of ICM check sent to Dr. Caryl Comes.   3 month ICM trend: 01/13/2020    1 Year ICM trend:       [name], RN 01/15/2020 11:39 AM

## 2020-01-30 ENCOUNTER — Ambulatory Visit (INDEPENDENT_AMBULATORY_CARE_PROVIDER_SITE_OTHER): Payer: Medicare Other | Admitting: *Deleted

## 2020-01-30 DIAGNOSIS — I4819 Other persistent atrial fibrillation: Secondary | ICD-10-CM | POA: Diagnosis not present

## 2020-01-30 LAB — CUP PACEART REMOTE DEVICE CHECK
Battery Remaining Longevity: 3 mo
Battery Voltage: 2.75 V
Brady Statistic AP VP Percent: 0 %
Brady Statistic AP VS Percent: 0 %
Brady Statistic AS VP Percent: 98.19 %
Brady Statistic AS VS Percent: 1.81 %
Brady Statistic RA Percent Paced: 0 %
Brady Statistic RV Percent Paced: 97.28 %
Date Time Interrogation Session: 20210211043625
HighPow Impedance: 48 Ohm
HighPow Impedance: 53 Ohm
Implantable Lead Implant Date: 20090226
Implantable Lead Implant Date: 20090226
Implantable Lead Implant Date: 20090226
Implantable Lead Location: 753858
Implantable Lead Location: 753859
Implantable Lead Location: 753860
Implantable Lead Model: 4194
Implantable Lead Model: 5076
Implantable Lead Model: 6947
Implantable Pulse Generator Implant Date: 20140929
Lead Channel Impedance Value: 285 Ohm
Lead Channel Impedance Value: 323 Ohm
Lead Channel Impedance Value: 380 Ohm
Lead Channel Impedance Value: 513 Ohm
Lead Channel Impedance Value: 551 Ohm
Lead Channel Impedance Value: 703 Ohm
Lead Channel Pacing Threshold Amplitude: 0.625 V
Lead Channel Pacing Threshold Pulse Width: 0.6 ms
Lead Channel Sensing Intrinsic Amplitude: 13 mV
Lead Channel Sensing Intrinsic Amplitude: 13 mV
Lead Channel Sensing Intrinsic Amplitude: 4.125 mV
Lead Channel Sensing Intrinsic Amplitude: 4.125 mV
Lead Channel Setting Pacing Amplitude: 1.75 V
Lead Channel Setting Pacing Amplitude: 2.5 V
Lead Channel Setting Pacing Pulse Width: 0.6 ms
Lead Channel Setting Pacing Pulse Width: 0.6 ms
Lead Channel Setting Sensing Sensitivity: 0.3 mV

## 2020-01-30 NOTE — Progress Notes (Signed)
PPM Remote  

## 2020-02-09 ENCOUNTER — Other Ambulatory Visit: Payer: Self-pay | Admitting: Cardiology

## 2020-02-17 ENCOUNTER — Ambulatory Visit (INDEPENDENT_AMBULATORY_CARE_PROVIDER_SITE_OTHER): Payer: Medicare Other

## 2020-02-17 DIAGNOSIS — I5022 Chronic systolic (congestive) heart failure: Secondary | ICD-10-CM | POA: Diagnosis not present

## 2020-02-17 DIAGNOSIS — Z95810 Presence of automatic (implantable) cardiac defibrillator: Secondary | ICD-10-CM

## 2020-02-21 NOTE — Progress Notes (Signed)
EPIC Encounter for ICM Monitoring  Patient Name: DRACEN FLUHARTY is a 74 y.o. male Date: 02/21/2020 Primary Care Physican: [name], MD Primary [place] Electrophysiologist: Vergie Living Pacing: 97.5% 01/15/2020 Weight:215 lbs  Battery Longevity: ~2 months   Spoke with wife and pt is doing well.    Optivol thoracic impedancenormal.  Prescribed:   Furosemide40 mgTake 2 tablets ( 80 mg ) in am and 1 tablet ( 40 mg ) inPM.  Potassium 20 mEqTAKE 1 TABLET BY MOUTH DAILY. TAKE ADDITIONAL TABLET WHEN TAKING EXTRA LASIX  Labs: 07/10/2019 Creatinine 1.39, BUN 25, Potassium 3.7, Sodium 144  Recommendations:No changes and encouraged to call if experiencing any fluid symptoms.  Follow-up plan: ICM clinic phone appointment on4/14/2020. 91 day device clinic remote transmission 03/31/2020.   Office appt 03/03/2020 with Dr. Caryl Comes.    Copy of ICM check sent to Dr. Caryl Comes.   3 month ICM trend: 02/17/2020    1 Year ICM trend:       [name], RN 02/21/2020 3:36 PM

## 2020-03-03 ENCOUNTER — Other Ambulatory Visit: Payer: Self-pay

## 2020-03-03 ENCOUNTER — Ambulatory Visit: Payer: Medicare Other | Admitting: Internal Medicine

## 2020-03-03 VITALS — BP 120/62 | HR 73 | Ht 70.0 in | Wt 223.0 lb

## 2020-03-03 DIAGNOSIS — I255 Ischemic cardiomyopathy: Secondary | ICD-10-CM

## 2020-03-03 DIAGNOSIS — I482 Chronic atrial fibrillation, unspecified: Secondary | ICD-10-CM

## 2020-03-03 DIAGNOSIS — I5022 Chronic systolic (congestive) heart failure: Secondary | ICD-10-CM | POA: Diagnosis not present

## 2020-03-03 NOTE — Progress Notes (Signed)
Patient Care Team: [name], MD as PCP - General (Family Medicine) Martinique, Peter M, MD as PCP - Cardiology (Cardiology) [name], MD as PCP - Electrophysiology (Cardiology) [name], MD as Consulting Physician (Cardiology)   HPI  [name] is a 74 y.o. male seen in follow-up for ischemic cardiomyopathy with HFrEF and prior CRT-D implantation.  He also has afib for which he saw Dr Greggory Brandy and the was decision to not pursue P VI    We had spent many months deciding what to do about rate control and finally underwent AV junction ablation 2/18   Continues to struggle with baseline shortness of breath.  Chronic edema.  No chest pain.  No bleeding.        DATE TEST EF   1/17    Echo  20.25 %   10/18 Echo  25-30%           Date Cr K Dig Hgb  4/18 1.43 3.4    11/18 1.42 4.6    12/19 1.79 3.7 (0.4) 12  7/20 1.39 3.7  12.0        He has moved to [place]. And loves it    Past Medical History:  Diagnosis Date  . AICD (automatic cardioverter/defibrillator) present   . Anemia   . Anxiety   . CHF (congestive heart failure) ([place])   . Chronic renal insufficiency   . Coronary artery disease   . Depression   . Dyslipidemia   . Dysrhythmia    atrial fibrillation  . ED (erectile dysfunction)   . GERD (gastroesophageal reflux disease)   . Incisional hernia, without obstruction or gangrene 04/10/2017  . Ischemic cardiomyopathy    EF 23%  . MI (myocardial infarction) ([place]) 2000   ANTERIOR, s/p PCI  . Other primary cardiomyopathies   . Peripheral vascular disease ([place])    dvt legs 2017  . Persistent atrial fibrillation ([facility])    on coumadin  . Presence of permanent cardiac pacemaker   . Shortness of breath dyspnea    on exertion    Past Surgical History:  Procedure Laterality Date  . AV NODE ABLATION N/A 01/25/2017   Procedure: AV Node Ablation;  Surgeon: [name], MD;  Location: [place] CV LAB;  Service: Cardiovascular;   Laterality: N/A;  . AV NODE ABLATION  01/25/2017  . BIV ICD GENERTAOR CHANGE OUT N/A 09/16/2013   Procedure: BIV ICD GENERTAOR CHANGE OUT;  Surgeon: [name], MD;  Location: [facility] CATH LAB;  Service: Cardiovascular;  Laterality: N/A;  . BREAST LUMPECTOMY Left   . CARDIAC CATHETERIZATION  02/05/2008   MODERATE TO SEVERE LEFT VENTRICULAR  DYSFUNCTION WITH EF 25-30%  . CARDIAC DEFIBRILLATOR PLACEMENT  2009   MDT BI-VENTRICULAR ICD by Dr Caryl Comes  . COLONOSCOPY WITH PROPOFOL N/A 08/31/2016   Procedure: COLONOSCOPY WITH PROPOFOL;  Surgeon: Wilford Corner, MD;  Location: [facility] ENDOSCOPY;  Service: Endoscopy;  Laterality: N/A;  . CORONARY ANGIOPLASTY  2000   stent  . CORONARY STENT PLACEMENT     LAD  . CYSTOSCOPY N/A 01/25/2016   Procedure: CYSTOSCOPY;  Surgeon: Carolan Clines, MD;  Location: [facility] ORS;  Service: Urology;  Laterality: N/A;  . ESOPHAGOGASTRODUODENOSCOPY (EGD) WITH PROPOFOL N/A 08/31/2016   Procedure: ESOPHAGOGASTRODUODENOSCOPY (EGD) WITH PROPOFOL;  Surgeon: Wilford Corner, MD;  Location[facility] ENDOSCOPY;  Service: Endoscopy;  Laterality: N/A;  . FRACTURE SURGERY Right 1972   leg  . IMPLANTABLE CARDIOVERTER DEFIBRILLATOR GENERATOR CHANGE  2014  . Reserve N/A 04/10/2017   Procedure: OPEN REPAIR INCISIONAL VENTRAL HERNIA;  Surgeon: [name], MD;  Location: [place];  Service: General;  Laterality: N/A;  . INSERT / REPLACE / REMOVE PACEMAKER     ICD  . INSERTION OF MESH N/A 04/10/2017   Procedure: INSERTION OF MESH;  Surgeon: [name], MD;  Location: [place];  Service: General;  Laterality: N/A;  . LAPAROTOMY N/A 02/22/2016   Procedure: EXPLORATORY LAPAROTOMY WITH  PATCH OF DUODENAL ULCER;  Surgeon: Rolm Bookbinder, MD;  Location: [place];  Service: General;  Laterality: N/A;  . MULTIPLE TOOTH EXTRACTIONS    . TRANSTHORACIC ECHOCARDIOGRAM  12/2010   EF 30-35%  . TRANSURETHRAL RESECTION OF PROSTATE N/A 01/25/2016   Procedure: TRANSURETHRAL RESECTION OF THE PROSTATE  (TURP);  Surgeon: Carolan Clines, MD;  Location: [facility] ORS;  Service: Urology;  Laterality: N/A;  . WRIST FRACTURE SURGERY Left 1972    Current Outpatient Medications  Medication Sig Dispense Refill  . acetaminophen (TYLENOL) 500 MG tablet Take 500 mg by mouth every 8 (eight) hours as needed for mild pain.    Marland Kitchen digoxin (LANOXIN) 0.125 MG tablet Take 1/2 tablet every other day 90 tablet 3  . fenofibrate 160 MG tablet TAKE 1 TABLET BY MOUTH  DAILY 90 tablet 2  . ferrous sulfate 325 (65 FE) MG tablet Take 325 mg by mouth 2 (two) times daily.    . folic acid (FOLVITE) [id number] MCG tablet Take 800 mcg by mouth daily.     . furosemide (LASIX) 40 MG tablet TAKE 2 TABLETS BY MOUTH IN  THE MORNING AND 1 TABLET IN THE EVENING 270 tablet 1  . losartan (COZAAR) 25 MG tablet TAKE 1 TABLET BY MOUTH  DAILY 90 tablet 3  . metoprolol succinate (TOPROL-XL) 25 MG 24 hr tablet TAKE 1 TABLET BY MOUTH  DAILY AT 12 NOON. 90 tablet 2  . Multiple Vitamins-Minerals (MULTIVITAMIN ADULTS PO) Take 1 tablet by mouth daily.    . nitroGLYCERIN (NITROSTAT) 0.4 MG SL tablet Place 1 tablet (0.4 mg total) under the tongue every 5 (five) minutes as needed for chest pain. 25 tablet 11  . pantoprazole (PROTONIX) 40 MG tablet TAKE 2 TABLETS BY MOUTH  DAILY 180 tablet 3  . potassium chloride SA (KLOR-CON) 20 MEQ tablet TAKE 1 TABLET BY MOUTH  DAILY. TAKE ADDITIONAL  TABLET WHEN TAKING EXTRA  LASIX (FUROSEMIDE) 135 tablet 3  . pravastatin (PRAVACHOL) 40 MG tablet TAKE 1 TABLET BY MOUTH  DAILY 90 tablet 3  . sertraline (ZOLOFT) 100 MG tablet Take 150 mg by mouth daily.     . vitamin C (ASCORBIC ACID) 250 MG tablet Take 250 mg by mouth 2 (two) times daily.    Marland Kitchen warfarin (COUMADIN) 1 MG tablet TAKE 3 TABLETS BY MOUTH  DAILY     No current facility-administered medications for this visit.    Allergies  Allergen Reactions  . Keflex [Cephalexin] Shortness Of Breath and Other (See Comments)    dizziness    Review of Systems negative  except from HPI and PMH  Physical Exam BP 120/62   Pulse 73   Ht 5\' 10"  (1.778 m)   Wt 223 lb (101.2 kg)   SpO2 97%   BMI 32.00 kg/m  Well developed and well nourished in no acute distress HENT normal Neck supple with JVP-flat Clear Device pocket well healed; without hematoma or erythema.  There is no tethering  Regular rate and rhythm, no murmur  Abd-soft with active BS No Clubbing cyanosis edema Skin-warm and dry A & Oriented  Grossly normal sensory and motor function  ECG atrial fibrillation with complete heart block and ventricular pacing Intervals-/16/47 QRS Rs lead V1 and rS lead I  Assessment and  Plan  Atrial fibrillation  -permanent  Complete heart block status post AV ablation  Congestive heart failure-chronic-systolic  Ischemic cardiomyopathy  Renal insufficiency grade 3  CRT-D.  The patient's device was interrogated.  The information was reviewed. No changes were made in the programming.         Device is approaching ERI.  Have reviewed the procedure.  Without symptoms of ischemia  Trace volume overload  On Anticoagulation;  No bleeding issues   Labs need reevaluation.  However, as he is appro/aching his device generator replacement we will defer.  I will send a note to Dr. Shirlee More that when he sees him on 4/27 surveillance laboratories can be obtained including his digoxin level.

## 2020-03-03 NOTE — Patient Instructions (Signed)
Medication Instructions:  Your physician recommends that you continue on your current medications as directed. Please refer to the Current Medication list given to you today.  Labwork: None ordered.  Testing/Procedures: None ordered.  Follow-Up: Your physician wants you to follow-up in: 6 months with Dr Klein. You will receive a reminder letter in the mail two months in advance. If you don't receive a letter, please call our office to schedule the follow-up appointment.  Remote monitoring is used to monitor your Pacemaker of ICD from home. This monitoring reduces the number of office visits required to check your device to one time per year. It allows us to keep an eye on the functioning of your device to ensure it is working properly.Any Other Special Instructions Will Be Listed Below (If Applicable).  If you need a refill on your cardiac medications before your next appointment, please call your pharmacy.   

## 2020-03-04 LAB — CUP PACEART INCLINIC DEVICE CHECK
Battery Remaining Longevity: 2 mo
Battery Voltage: 2.72 V
Brady Statistic AP VP Percent: 0 %
Brady Statistic AP VS Percent: 0 %
Brady Statistic AS VP Percent: 98.06 %
Brady Statistic AS VS Percent: 1.94 %
Brady Statistic RA Percent Paced: 0 %
Brady Statistic RV Percent Paced: 97.36 %
Date Time Interrogation Session: 20210316153645
HighPow Impedance: 49 Ohm
HighPow Impedance: 61 Ohm
Implantable Lead Implant Date: 20090226
Implantable Lead Implant Date: 20090226
Implantable Lead Implant Date: 20090226
Implantable Lead Location: 753858
Implantable Lead Location: 753859
Implantable Lead Location: 753860
Implantable Lead Model: 4194
Implantable Lead Model: 5076
Implantable Lead Model: 6947
Implantable Pulse Generator Implant Date: 20140929
Lead Channel Impedance Value: 285 Ohm
Lead Channel Impedance Value: 323 Ohm
Lead Channel Impedance Value: 342 Ohm
Lead Channel Impedance Value: 551 Ohm
Lead Channel Impedance Value: 608 Ohm
Lead Channel Impedance Value: 779 Ohm
Lead Channel Pacing Threshold Amplitude: 0.75 V
Lead Channel Pacing Threshold Amplitude: 1 V
Lead Channel Pacing Threshold Pulse Width: 0.6 ms
Lead Channel Pacing Threshold Pulse Width: 0.6 ms
Lead Channel Setting Pacing Amplitude: 1.75 V
Lead Channel Setting Pacing Amplitude: 2.5 V
Lead Channel Setting Pacing Pulse Width: 0.6 ms
Lead Channel Setting Pacing Pulse Width: 0.6 ms
Lead Channel Setting Sensing Sensitivity: 0.3 mV

## 2020-03-06 ENCOUNTER — Telehealth: Payer: Self-pay | Admitting: Emergency Medicine

## 2020-03-06 NOTE — Telephone Encounter (Signed)
Alert received that CRT-D at RRT as of 03/06/20. Patient notified at 03/03/20 appointment with  Dr Caryl Comes that RRT would be reached in near future. Patient notified his device has reached RRT and that he will be notified of schedule for replacement.

## 2020-03-09 NOTE — Telephone Encounter (Signed)
Thanks SK   

## 2020-03-11 ENCOUNTER — Other Ambulatory Visit: Payer: Self-pay | Admitting: Cardiology

## 2020-03-17 ENCOUNTER — Telehealth: Payer: Self-pay

## 2020-03-17 DIAGNOSIS — I5022 Chronic systolic (congestive) heart failure: Secondary | ICD-10-CM

## 2020-03-17 DIAGNOSIS — I255 Ischemic cardiomyopathy: Secondary | ICD-10-CM

## 2020-03-17 DIAGNOSIS — Z5181 Encounter for therapeutic drug level monitoring: Secondary | ICD-10-CM

## 2020-03-17 DIAGNOSIS — Z01812 Encounter for preprocedural laboratory examination: Secondary | ICD-10-CM

## 2020-03-17 NOTE — Telephone Encounter (Signed)
Spoke with pt and advised CRT-D generator change out has been scheduled for 04/24/2020 with arrival at 1030am.  Pt will have labs on 04/21/2020 at 1030am and covid screening scheduled for 1115am.  Pt advised instruction letter will be mailed to him.  He may pick up surgical scrub with instructions when he comes for labs 04/21/2020.  See device change instruction letter.  Pt verbalizes understanding and agrees with current plan.

## 2020-03-31 ENCOUNTER — Ambulatory Visit (INDEPENDENT_AMBULATORY_CARE_PROVIDER_SITE_OTHER): Payer: Medicare Other

## 2020-03-31 ENCOUNTER — Ambulatory Visit (INDEPENDENT_AMBULATORY_CARE_PROVIDER_SITE_OTHER): Payer: Medicare Other | Admitting: *Deleted

## 2020-03-31 DIAGNOSIS — Z95810 Presence of automatic (implantable) cardiac defibrillator: Secondary | ICD-10-CM

## 2020-03-31 DIAGNOSIS — I5022 Chronic systolic (congestive) heart failure: Secondary | ICD-10-CM

## 2020-03-31 DIAGNOSIS — I4819 Other persistent atrial fibrillation: Secondary | ICD-10-CM

## 2020-03-31 LAB — CUP PACEART REMOTE DEVICE CHECK
Battery Remaining Longevity: 1 mo — CL
Battery Voltage: 2.69 V
Brady Statistic AP VP Percent: 0 %
Brady Statistic AP VS Percent: 0 %
Brady Statistic AS VP Percent: 97.71 %
Brady Statistic AS VS Percent: 2.29 %
Brady Statistic RA Percent Paced: 0 %
Brady Statistic RV Percent Paced: 97.27 %
Date Time Interrogation Session: 20210413043724
HighPow Impedance: 46 Ohm
HighPow Impedance: 52 Ohm
Implantable Lead Implant Date: 20090226
Implantable Lead Implant Date: 20090226
Implantable Lead Implant Date: 20090226
Implantable Lead Location: 753858
Implantable Lead Location: 753859
Implantable Lead Location: 753860
Implantable Lead Model: 4194
Implantable Lead Model: 5076
Implantable Lead Model: 6947
Implantable Pulse Generator Implant Date: 20140929
Lead Channel Impedance Value: 285 Ohm
Lead Channel Impedance Value: 285 Ohm
Lead Channel Impedance Value: 380 Ohm
Lead Channel Impedance Value: 494 Ohm
Lead Channel Impedance Value: 551 Ohm
Lead Channel Impedance Value: 665 Ohm
Lead Channel Pacing Threshold Amplitude: 0.625 V
Lead Channel Pacing Threshold Pulse Width: 0.6 ms
Lead Channel Sensing Intrinsic Amplitude: 13 mV
Lead Channel Sensing Intrinsic Amplitude: 13 mV
Lead Channel Sensing Intrinsic Amplitude: 4 mV
Lead Channel Sensing Intrinsic Amplitude: 4 mV
Lead Channel Setting Pacing Amplitude: 1.75 V
Lead Channel Setting Pacing Amplitude: 2.5 V
Lead Channel Setting Pacing Pulse Width: 0.6 ms
Lead Channel Setting Pacing Pulse Width: 0.6 ms
Lead Channel Setting Sensing Sensitivity: 0.3 mV

## 2020-03-31 NOTE — Progress Notes (Signed)
EPIC Encounter for ICM Monitoring  [name] is a 74 y.o. male Date: 03/31/2020 Primary Care Physican: [name], MD Primary [place] Electrophysiologist: Vergie Living Pacing: 97.3% Last Weight:215 lbs  ERI reached on 03/06/2020   Spoke with wife and pt is doing well.Device battery replacement is scheduled for 04/24/2020.   He reports he is having trouble with the monitor and [employer] is sending him new one.   Optivol thoracic impedancetrending close to normal.  Prescribed:   Furosemide40 mgTake 2 tablets ( 80 mg ) in am and 1 tablet ( 40 mg ) inPM.  Potassium 20 mEqTAKE 1 TABLET BY MOUTH DAILY. TAKE ADDITIONAL TABLET WHEN TAKING EXTRA LASIX  Labs: 07/10/2019 Creatinine 1.39, BUN 25, Potassium 3.7, Sodium 144  Recommendations:No changes and encouraged to call if experiencing any fluid symptoms.  Follow-up plan: ICM clinic phone appointment on6/21/2020 (post battery replacement). 91 day device clinic remote transmission 04/30/2020.OV with Dr Martinique on 05/11/2020.  Copy of ICM check sent to [place].   3 month ICM trend: 03/31/2020    1 Year ICM trend:       [name], RN 03/31/2020 3:01 PM

## 2020-04-05 NOTE — Progress Notes (Signed)
Cardiology Office Note    Date:  04/14/2020   ID:  [name], [name] [birth year], MRN [id number]  PCP:  [name], MD  Cardiologist:  Dr. Ivery Michalski [place]   Chief Complaint  Patient presents with  . Congestive Heart Failure    History of Present Illness:  [name] is a 74 y.o. male with PMH of CAD s/p remote anterior MI, ICM s/p CRT-D, atrial fibrillation, and HLD. He had generator change placed in September 2014. He is intolerant of amiodarone. His QT interval was too long for Tikosyn. He previously attempted to increase his carvedilol dose, however developed significant hypotension.   In October 2017, he had volume overloaded indicated by his Optival reading, Lasix was increased to 40 mg twice a day. He was started on Entresto but developed hypotension, he was later transitioned back to losartan. Due to persistent atrial fibrillation with elevated heart rate, he underwent AV nodal ablation in February 2018.  He was admitted to the hospital on 09/30/2017 with renal insufficiency. He reported episode of weakness. He was also treated for SIRS and sepsis with temp 102 and tachypnea. White blood cell count was 16.6. Initial lactic acid 2.14. He was hydrated with IV fluid. Troponin was borderline elevated, this was felt to be related to elevation of creatinine up to 2.71. He was treated with vancomycin, Levaquin and aztreonam. Culture was negative. Repeat echocardiogram obtained on 10/01/2017 continues showed EF 25-30%, mild MR, mild-to-moderate TR, akinesis of the apical anterior, lateral, inferolateral, inferior, apical myocardium with no evidence of apical thrombus by Definity contrast, PA peak pressure 37 mmHg.  He was seen in July 2020 with some fluid accumulation. Lasix increased for 3 days. Last ICD check April 1 showed normal Optivol. He has reached ERI. Generator replacement planned for May 7.   He reports he is doing well. Watching salt intake for the most part. Weight is  stable. He is experiencing leg cramps at night. Notes leg fatigue if he has to walk much or go up stairs. He is taking OTC magnesium. Still taking lasix 80 mg in the am and 40 in the pm. Has severe arthritis in right knee.    Past Medical History:  Diagnosis Date  . AICD (automatic cardioverter/defibrillator) present   . Anemia   . Anxiety   . CHF (congestive heart failure) ([place])   . Chronic renal insufficiency   . Coronary artery disease   . Depression   . Dyslipidemia   . Dysrhythmia    atrial fibrillation  . ED (erectile dysfunction)   . GERD (gastroesophageal reflux disease)   . Incisional hernia, without obstruction or gangrene 04/10/2017  . Ischemic cardiomyopathy    EF 23%  . MI (myocardial infarction) ([place]) 2000   ANTERIOR, s/p PCI  . Other primary cardiomyopathies   . Peripheral vascular disease ([place])    dvt legs 2017  . Persistent atrial fibrillation (HCC)    on coumadin  . Presence of permanent cardiac pacemaker   . Shortness of breath dyspnea    on exertion    Past Surgical History:  Procedure Laterality Date  . AV NODE ABLATION N/A 01/25/2017   Procedure: AV Node Ablation;  Surgeon: [name], MD;  Location: [place] CV LAB;  Service: Cardiovascular;  Laterality: N/A;  . AV NODE ABLATION  01/25/2017  . BIV ICD GENERTAOR CHANGE OUT N/A 09/16/2013   Procedure: BIV ICD GENERTAOR CHANGE OUT;  Surgeon: [name], MD;  Location: [facility] CATH  LAB;  Service: Cardiovascular;  Laterality: N/A;  . BREAST LUMPECTOMY Left   . CARDIAC CATHETERIZATION  02/05/2008   MODERATE TO SEVERE LEFT VENTRICULAR  DYSFUNCTION WITH EF 25-30%  . CARDIAC DEFIBRILLATOR PLACEMENT  2009   MDT BI-VENTRICULAR ICD by Dr Caryl Comes  . COLONOSCOPY WITH PROPOFOL N/A 08/31/2016   Procedure: COLONOSCOPY WITH PROPOFOL;  Surgeon: Wilford Corner, MD;  Location: [facility] ENDOSCOPY;  Service: Endoscopy;  Laterality: N/A;  . CORONARY ANGIOPLASTY  2000   stent  . CORONARY STENT PLACEMENT     LAD  .  CYSTOSCOPY N/A 01/25/2016   Procedure: CYSTOSCOPY;  Surgeon: Carolan Clines, MD;  Location: [facility] ORS;  Service: Urology;  Laterality: N/A;  . ESOPHAGOGASTRODUODENOSCOPY (EGD) WITH PROPOFOL N/A 08/31/2016   Procedure: ESOPHAGOGASTRODUODENOSCOPY (EGD) WITH PROPOFOL;  Surgeon: Wilford Corner, MD;  Location: [facility] ENDOSCOPY;  Service: Endoscopy;  Laterality: N/A;  . FRACTURE SURGERY Right 1972   leg  . IMPLANTABLE CARDIOVERTER DEFIBRILLATOR GENERATOR CHANGE  2014  . INCISIONAL HERNIA REPAIR N/A 04/10/2017   Procedure: OPEN REPAIR INCISIONAL VENTRAL HERNIA;  Surgeon: [name], MD;  Location: [place];  Service: General;  Laterality: N/A;  . INSERT / REPLACE / REMOVE PACEMAKER     ICD  . INSERTION OF MESH N/A 04/10/2017   Procedure: INSERTION OF MESH;  Surgeon: [name], MD;  Location: [place];  Service: General;  Laterality: N/A;  . LAPAROTOMY N/A 02/22/2016   Procedure: EXPLORATORY LAPAROTOMY WITH  PATCH OF DUODENAL ULCER;  Surgeon: Rolm Bookbinder, MD;  Location: [place];  Service: General;  Laterality: N/A;  . MULTIPLE TOOTH EXTRACTIONS    . TRANSTHORACIC ECHOCARDIOGRAM  12/2010   EF 30-35%  . TRANSURETHRAL RESECTION OF PROSTATE N/A 01/25/2016   Procedure: TRANSURETHRAL RESECTION OF THE PROSTATE (TURP);  Surgeon: Carolan Clines, MD;  Location: [facility] ORS;  Service: Urology;  Laterality: N/A;  . WRIST FRACTURE SURGERY Left 1972    Current Medications: Outpatient Medications Prior to Visit  Medication Sig Dispense Refill  . acetaminophen (TYLENOL) 500 MG tablet Take 500 mg by mouth every 6 (six) hours as needed for mild pain or moderate pain.    Marland Kitchen digoxin (LANOXIN) 0.125 MG tablet Take 0.125 mg by mouth daily.  90 tablet 3  . fenofibrate 160 MG tablet TAKE 1 TABLET BY MOUTH  DAILY (Patient taking differently: Take 160 mg by mouth daily at 2 PM. ) 90 tablet 2  . ferrous sulfate 325 (65 FE) MG tablet Take 325 mg by mouth 2 (two) times daily.    . folic acid (FOLVITE) [id number] MCG tablet Take 800 mcg  by mouth every evening.     . furosemide (LASIX) 40 MG tablet TAKE 2 TABLETS BY MOUTH IN  THE MORNING AND 1 TABLET IN THE EVENING (Patient taking differently: Take 40-80 mg by mouth 2 (two) times daily. Take 80 mg in the morning and 40 in the afternoon) 270 tablet 1  . losartan (COZAAR) 25 MG tablet TAKE 1 TABLET BY MOUTH  DAILY (Patient taking differently: Take 25 mg by mouth daily. ) 90 tablet 3  . metoprolol succinate (TOPROL-XL) 25 MG 24 hr tablet TAKE 1 TABLET BY MOUTH  DAILY AT 12 NOON. (Patient taking differently: Take 25 mg by mouth daily. ) 90 tablet 2  . Multiple Vitamins-Minerals (MULTIVITAMIN ADULTS PO) Take 1 tablet by mouth daily. Senior    . pantoprazole (PROTONIX) 40 MG tablet TAKE 2 TABLETS BY MOUTH  DAILY (Patient taking differently: Take 40 mg by mouth 2 (two) times daily. )  180 tablet 3  . potassium chloride SA (KLOR-CON) 20 MEQ tablet TAKE 1 TABLET BY MOUTH  DAILY. TAKE ADDITIONAL  TABLET WHEN TAKING EXTRA  LASIX (FUROSEMIDE) (Patient taking differently: Take 70 mEq by mouth. ) 135 tablet 3  . pravastatin (PRAVACHOL) 40 MG tablet TAKE 1 TABLET BY MOUTH  DAILY (Patient taking differently: Take 40 mg by mouth at bedtime. \) 90 tablet 3  . sertraline (ZOLOFT) 100 MG tablet Take 150 mg by mouth daily at 2 PM.     . vitamin C (ASCORBIC ACID) 250 MG tablet Take 250 mg by mouth 2 (two) times daily.    Marland Kitchen warfarin (COUMADIN) 1 MG tablet Take 3 mg by mouth at bedtime. 2100-2200    . nitroGLYCERIN (NITROSTAT) 0.4 MG SL tablet Place 1 tablet (0.4 mg total) under the tongue every 5 (five) minutes as needed for chest pain. 25 tablet 11   No facility-administered medications prior to visit.     Allergies:   Keflex [cephalexin]   Social History   Socioeconomic History  . Marital status: Married    Spouse name: Not on file  . Number of children: 2  . Years of education: Not on file  . Highest education level: Not on file  Occupational History  . Occupation: disabled    Employer:  RETIRED  Tobacco Use  . Smoking status: Former Smoker    Types: Cigarettes    Quit date: 12/23/1998    Years since quitting: 21.3  . Smokeless tobacco: Never Used  Substance and Sexual Activity  . Alcohol use: No  . Drug use: No  . Sexual activity: Not on file  Other Topics Concern  . Not on file  Social History Narrative   Lives in [place].  Retired Teacher, English as a foreign language         Social Determinants of Radio broadcast assistant Strain:   . Difficulty of Paying Living Expenses:   Food Insecurity:   . Worried About Charity fundraiser in the Last Year:   . Arboriculturist in the Last Year:   Transportation Needs:   . Film/video editor (Medical):   Marland Kitchen Lack of Transportation (Non-Medical):   Physical Activity:   . Days of Exercise per Week:   . Minutes of Exercise per Session:   Stress:   . Feeling of Stress :   Social Connections:   . Frequency of Communication with Friends and Family:   . Frequency of Social Gatherings with Friends and Family:   . Attends Religious Services:   . Active Member of Clubs or Organizations:   . Attends Archivist Meetings:   Marland Kitchen Marital Status:      Family History:  The patient's family history includes Breast cancer in his mother and sister; Depression in his mother; Heart attack in his father.   ROS:   Please see the history of present illness.    ROS All other systems reviewed and are negative.   PHYSICAL EXAM:   VS:  BP 116/65   Pulse 93   Ht 5\' 10"  (1.778 m)   Wt 220 lb 3.2 oz (99.9 kg)   SpO2 94%   BMI 31.60 kg/m    GENERAL:  Well appearing overweight WM in NAD.  HEENT:  PERRL, EOMI, sclera are clear. Oropharynx is clear. NECK:  No jugular venous distention, carotid upstroke brisk and symmetric, no bruits, no thyromegaly or adenopathy LUNGS:  Clear to auscultation bilaterally CHEST:  Unremarkable HEART:  RRR,  PMI not displaced or sustained,S1 and S2 within normal limits, no S3, no S4: no clicks, no rubs, no  murmurs ABD:  Soft, nontender. BS +, no masses or bruits. No hepatomegaly, no splenomegaly EXT:  2 + pulses throughout, tr edema, no cyanosis no clubbing. Compression hose in place. SKIN:  Warm and dry.  No rashes NEURO:  Alert and oriented x 3. Cranial nerves II through XII intact. PSYCH:  Cognitively intact   Wt Readings from Last 3 Encounters:  04/14/20 220 lb 3.2 oz (99.9 kg)  03/03/20 223 lb (101.2 kg)  12/17/19 223 lb 3.2 oz (101.2 kg)      Studies/Labs Reviewed:   EKG:  EKG is not ordered today.     Recent Labs: 07/10/2019: BUN 25; Creatinine, Ser 1.39; Potassium 3.7; Sodium 144   Lipid Panel    Component Value Date/Time   CHOL 112 02/27/2016 0500   TRIG 53 02/29/2016 0509   HDL 32 (L) 02/27/2016 0500   CHOLHDL 3.5 02/27/2016 0500   VLDL 14 02/27/2016 0500   LDLCALC 66 02/27/2016 0500   LDLDIRECT 137.6 01/06/2012 1029   Labs dated 05/01/18: BUN 41, creatinine 1.72. Other chemistries normal. Dig level 0.2. WBC 7.7. Hgb 11.8. Dated 10/08/18: INR 2.3. Hgb 11.8. BUN 32, creatinine 1.48. Other chemistries normal.  Dated 12/20/18: Dig level < 0.4. Dated 04/29/19: Hgb 12.1. BUN 30, creatinine 1.47. other chemistries normal  Additional studies/ records that were reviewed today include:   Echo 10/01/2017 LV EF: 25% -   30%  Study Conclusions  - Left ventricle: The cavity size was normal. Systolic function was   severely reduced. The estimated ejection fraction was in the   range of 25% to 30%. There is akinesis of the apical anterior,   lateral, inferolateral, inferior, and apical myocardium with no   evidence of apical thrombus by definity contrast. The study is   not technically sufficient to allow evaluation of LV diastolic   function. Doppler parameters are consistent with high ventricular   filling pressure. - Mitral valve: There was mild regurgitation. - Left atrium: The atrium was mildly dilated. - Tricuspid valve: There was mild-moderate regurgitation. -  Pulmonic valve: There was trivial regurgitation. - Pulmonary arteries: PA peak pressure: 37 mm Hg (S).  Impressions:  - The right ventricular systolic pressure was increased consistent   with mild pulmonary hypertension.   ASSESSMENT:    1. Chronic systolic CHF (congestive heart failure) ([place])   2. Chronic atrial fibrillation (HCC)   3. Ischemic cardiomyopathy   4. Biventricular implantable cardioverter-defibrillator Medtronic   5. Leg cramps      PLAN:  In order of problems listed above:  1. Chronic systolic heart failure: EF 25-30%. weight is stable. Last Optivol on April 1 was good.  Looks well compensated on lasix 80 mg in am and 40 mg in the pm.   Instructed on importance of sodium and fluid restriction. Will continue his other therapy.  2. ICM s/p ICD:   Device check on October 12 was good with BiV pacing. Plan generator change out for ERI on May 7.   3. CAD: Denies any anginal symptoms.  4. Hyperlipidemia: On Pravachol 40 mg daily.  5. Persistent atrial fibrillation: He had AV nodal ablation in early 2018.  Continue Coumadin.   6. CKD stage 3.   7.   Leg cramps. Will check potassium and magnesium levels.     Medication Adjustments/Labs and Tests Ordered: Current medicines are reviewed at length with the  patient today.  Concerns regarding medicines are outlined above.  Medication changes, Labs and Tests ordered today are listed in the Patient Instructions below. There are no Patient Instructions on file for this visit.   Signed, Marge Vandermeulen [place], MD  04/14/2020 11:06 AM    [facility] [address], [place], [place]  [address] Phone: [phone]; Fax: [phone]

## 2020-04-06 ENCOUNTER — Telehealth: Payer: Self-pay

## 2020-04-06 NOTE — Telephone Encounter (Signed)
Attempted ICM return call to patient as requested by voice mail message.  He left message stating he received new medtronic device monitor and wanted to confirm the report was received today.  Left message per DPR the report was received and suggest normal fluid levels. Left call back number.

## 2020-04-09 ENCOUNTER — Telehealth: Payer: Self-pay

## 2020-04-09 NOTE — Telephone Encounter (Signed)
Spoke with pt and advised of time change for CRT-D generator change.  Pt will need to arrive at [facility] 930am for 1130am start time.  Pt verbalizes understanding and agrees with current plan.

## 2020-04-14 ENCOUNTER — Ambulatory Visit: Payer: Medicare Other | Admitting: Cardiology

## 2020-04-14 ENCOUNTER — Encounter: Payer: Self-pay | Admitting: Cardiology

## 2020-04-14 ENCOUNTER — Other Ambulatory Visit: Payer: Self-pay

## 2020-04-14 VITALS — BP 116/65 | HR 93 | Ht 70.0 in | Wt 220.2 lb

## 2020-04-14 DIAGNOSIS — I255 Ischemic cardiomyopathy: Secondary | ICD-10-CM

## 2020-04-14 DIAGNOSIS — I482 Chronic atrial fibrillation, unspecified: Secondary | ICD-10-CM

## 2020-04-14 DIAGNOSIS — I5022 Chronic systolic (congestive) heart failure: Secondary | ICD-10-CM

## 2020-04-14 DIAGNOSIS — Z95810 Presence of automatic (implantable) cardiac defibrillator: Secondary | ICD-10-CM

## 2020-04-14 DIAGNOSIS — R252 Cramp and spasm: Secondary | ICD-10-CM

## 2020-04-15 ENCOUNTER — Other Ambulatory Visit: Payer: Self-pay | Admitting: Cardiology

## 2020-04-16 LAB — POCT INR: INR: 2 (ref 2.0–3.0)

## 2020-04-21 ENCOUNTER — Other Ambulatory Visit: Payer: Self-pay

## 2020-04-21 ENCOUNTER — Telehealth: Payer: Self-pay

## 2020-04-21 ENCOUNTER — Other Ambulatory Visit: Payer: Medicare Other

## 2020-04-21 ENCOUNTER — Other Ambulatory Visit (HOSPITAL_COMMUNITY)
Admission: RE | Admit: 2020-04-21 | Discharge: 2020-04-21 | Disposition: A | Discharge status: Home / Self Care | Payer: Medicare Other | Source: Ambulatory Visit | Attending: Internal Medicine | Admitting: Internal Medicine

## 2020-04-21 DIAGNOSIS — I255 Ischemic cardiomyopathy: Secondary | ICD-10-CM

## 2020-04-21 DIAGNOSIS — Z20822 Contact with and (suspected) exposure to covid-19: Secondary | ICD-10-CM | POA: Diagnosis not present

## 2020-04-21 DIAGNOSIS — I5022 Chronic systolic (congestive) heart failure: Secondary | ICD-10-CM

## 2020-04-21 DIAGNOSIS — Z01812 Encounter for preprocedural laboratory examination: Secondary | ICD-10-CM | POA: Insufficient documentation

## 2020-04-21 DIAGNOSIS — Z5181 Encounter for therapeutic drug level monitoring: Secondary | ICD-10-CM

## 2020-04-21 LAB — CBC
Hematocrit: 36.4 % — ABNORMAL LOW (ref 37.5–51.0)
Hemoglobin: 12.1 g/dL — ABNORMAL LOW (ref 13.0–17.7)
MCH: 27.4 pg (ref 26.6–33.0)
MCHC: 33.2 g/dL (ref 31.5–35.7)
MCV: 83 fL (ref 79–97)
Platelets: 270 10*3/uL (ref 150–450)
RBC: 4.41 x10E6/uL (ref 4.14–5.80)
RDW: 14.5 % (ref 11.6–15.4)
WBC: 5.5 10*3/uL (ref 3.4–10.8)

## 2020-04-21 LAB — BASIC METABOLIC PANEL
BUN/Creatinine Ratio: 17 (ref 10–24)
BUN: 29 mg/dL — ABNORMAL HIGH (ref 8–27)
CO2: 28 mmol/L (ref 20–29)
Calcium: 9.4 mg/dL (ref 8.6–10.2)
Chloride: 104 mmol/L (ref 96–106)
Creatinine, Ser: 1.67 mg/dL — ABNORMAL HIGH (ref 0.76–1.27)
GFR calc Af Amer: 47 mL/min/{1.73_m2} — ABNORMAL LOW (ref >59)
GFR calc non Af Amer: 40 mL/min/{1.73_m2} — ABNORMAL LOW (ref >59)
Glucose: 74 mg/dL (ref 65–99)
Potassium: 4 mmol/L (ref 3.5–5.2)
Sodium: 141 mmol/L (ref 134–144)

## 2020-04-21 LAB — SARS CORONAVIRUS 2 (TAT 6-24 HRS): SARS Coronavirus 2: NEGATIVE

## 2020-04-21 LAB — MAGNESIUM: Magnesium: 2.2 mg/dL (ref 1.6–2.3)

## 2020-04-21 LAB — PROTIME-INR
INR: 2 — ABNORMAL HIGH (ref 0.9–1.2)
Prothrombin Time: 21.4 s — ABNORMAL HIGH (ref 9.1–12.0)

## 2020-04-21 NOTE — Telephone Encounter (Signed)
Spoke with pt and advised per Dr Caryl Comes he is to continue his Coumadin as prescribed prior to his surgery scheduled for 04/24/2020.  Pt verbalizes understanding and agrees with current plan.

## 2020-04-24 ENCOUNTER — Encounter (HOSPITAL_COMMUNITY)
Admission: RE | Disposition: A | Discharge status: Home / Self Care | Payer: Self-pay | Source: Ambulatory Visit | Attending: Internal Medicine

## 2020-04-24 ENCOUNTER — Ambulatory Visit (HOSPITAL_COMMUNITY)
Admission: RE | Admit: 2020-04-24 | Discharge: 2020-04-24 | Disposition: A | Discharge status: Home / Self Care | Payer: Medicare Other | Source: Ambulatory Visit | Attending: Internal Medicine | Admitting: Internal Medicine

## 2020-04-24 ENCOUNTER — Other Ambulatory Visit: Payer: Self-pay

## 2020-04-24 DIAGNOSIS — I252 Old myocardial infarction: Secondary | ICD-10-CM | POA: Diagnosis not present

## 2020-04-24 DIAGNOSIS — D649 Anemia, unspecified: Secondary | ICD-10-CM | POA: Diagnosis not present

## 2020-04-24 DIAGNOSIS — K219 Gastro-esophageal reflux disease without esophagitis: Secondary | ICD-10-CM | POA: Diagnosis not present

## 2020-04-24 DIAGNOSIS — Z79899 Other long term (current) drug therapy: Secondary | ICD-10-CM | POA: Diagnosis not present

## 2020-04-24 DIAGNOSIS — I739 Peripheral vascular disease, unspecified: Secondary | ICD-10-CM | POA: Diagnosis not present

## 2020-04-24 DIAGNOSIS — Z4502 Encounter for adjustment and management of automatic implantable cardiac defibrillator: Secondary | ICD-10-CM | POA: Insufficient documentation

## 2020-04-24 DIAGNOSIS — F329 Major depressive disorder, single episode, unspecified: Secondary | ICD-10-CM | POA: Insufficient documentation

## 2020-04-24 DIAGNOSIS — I2510 Atherosclerotic heart disease of native coronary artery without angina pectoris: Secondary | ICD-10-CM | POA: Diagnosis not present

## 2020-04-24 DIAGNOSIS — I442 Atrioventricular block, complete: Secondary | ICD-10-CM | POA: Diagnosis not present

## 2020-04-24 DIAGNOSIS — I4819 Other persistent atrial fibrillation: Secondary | ICD-10-CM | POA: Insufficient documentation

## 2020-04-24 DIAGNOSIS — I255 Ischemic cardiomyopathy: Secondary | ICD-10-CM | POA: Insufficient documentation

## 2020-04-24 DIAGNOSIS — Z86718 Personal history of other venous thrombosis and embolism: Secondary | ICD-10-CM | POA: Diagnosis not present

## 2020-04-24 DIAGNOSIS — I5022 Chronic systolic (congestive) heart failure: Secondary | ICD-10-CM | POA: Diagnosis not present

## 2020-04-24 DIAGNOSIS — R0602 Shortness of breath: Secondary | ICD-10-CM | POA: Diagnosis not present

## 2020-04-24 DIAGNOSIS — N289 Disorder of kidney and ureter, unspecified: Secondary | ICD-10-CM | POA: Diagnosis not present

## 2020-04-24 DIAGNOSIS — Z87891 Personal history of nicotine dependence: Secondary | ICD-10-CM | POA: Diagnosis not present

## 2020-04-24 DIAGNOSIS — E785 Hyperlipidemia, unspecified: Secondary | ICD-10-CM | POA: Insufficient documentation

## 2020-04-24 DIAGNOSIS — Z7901 Long term (current) use of anticoagulants: Secondary | ICD-10-CM | POA: Diagnosis not present

## 2020-04-24 DIAGNOSIS — F419 Anxiety disorder, unspecified: Secondary | ICD-10-CM | POA: Insufficient documentation

## 2020-04-24 DIAGNOSIS — Z881 Allergy status to other antibiotic agents status: Secondary | ICD-10-CM | POA: Insufficient documentation

## 2020-04-24 DIAGNOSIS — I428 Other cardiomyopathies: Secondary | ICD-10-CM

## 2020-04-24 HISTORY — PX: BIV ICD GENERATOR CHANGEOUT: EP1194

## 2020-04-24 LAB — PROTIME-INR
INR: 2 — ABNORMAL HIGH (ref 0.8–1.2)
Prothrombin Time: 21.6 seconds — ABNORMAL HIGH (ref 11.4–15.2)

## 2020-04-24 SURGERY — BIV ICD GENERATOR CHANGEOUT

## 2020-04-24 MED ORDER — FENTANYL CITRATE (PF) 100 MCG/2ML IJ SOLN
INTRAMUSCULAR | Status: AC
  Filled 2020-04-24: qty 2

## 2020-04-24 MED ORDER — ONDANSETRON HCL 4 MG/2ML IJ SOLN: 4.0000 mg | Freq: Four times a day (QID) | INTRAMUSCULAR | Status: DC | PRN

## 2020-04-24 MED ORDER — LIDOCAINE HCL (PF) 1 % IJ SOLN
INTRAMUSCULAR | Status: DC | PRN
  Administered 2020-04-24: 30 mL

## 2020-04-24 MED ORDER — FENTANYL CITRATE (PF) 100 MCG/2ML IJ SOLN
INTRAMUSCULAR | Status: DC | PRN
  Administered 2020-04-24 (×2): 25 ug via INTRAVENOUS

## 2020-04-24 MED ORDER — CHLORHEXIDINE GLUCONATE 4 % EX LIQD
4.0000 "application " | Freq: Once | CUTANEOUS | Status: DC
  Filled 2020-04-24: qty 60

## 2020-04-24 MED ORDER — VANCOMYCIN HCL IN DEXTROSE 1-5 GM/200ML-% IV SOLN
1000.0000 mg | INTRAVENOUS | Status: AC
  Administered 2020-04-24: 1000 mg via INTRAVENOUS

## 2020-04-24 MED ORDER — LIDOCAINE HCL 1 % IJ SOLN
INTRAMUSCULAR | Status: AC
  Filled 2020-04-24: qty 60

## 2020-04-24 MED ORDER — SODIUM CHLORIDE 0.9 % IV SOLN: INTRAVENOUS | Status: DC

## 2020-04-24 MED ORDER — SODIUM CHLORIDE 0.9 % IV SOLN
80.0000 mg | INTRAVENOUS | Status: AC
  Administered 2020-04-24: 80 mg

## 2020-04-24 MED ORDER — SODIUM CHLORIDE 0.9 % IV SOLN
INTRAVENOUS | Status: AC
  Filled 2020-04-24: qty 2

## 2020-04-24 MED ORDER — MIDAZOLAM HCL 5 MG/5ML IJ SOLN
INTRAMUSCULAR | Status: AC
  Filled 2020-04-24: qty 5

## 2020-04-24 MED ORDER — VANCOMYCIN HCL IN DEXTROSE 1-5 GM/200ML-% IV SOLN
INTRAVENOUS | Status: AC
  Filled 2020-04-24: qty 200

## 2020-04-24 MED ORDER — ACETAMINOPHEN 325 MG PO TABS
325.0000 mg | ORAL_TABLET | ORAL | Status: DC | PRN
  Filled 2020-04-24: qty 2

## 2020-04-24 MED ORDER — HEPARIN (PORCINE) IN NACL 1000-0.9 UT/500ML-% IV SOLN
INTRAVENOUS | Status: AC
  Filled 2020-04-24: qty 500

## 2020-04-24 MED ORDER — MIDAZOLAM HCL 5 MG/5ML IJ SOLN
INTRAMUSCULAR | Status: DC | PRN
  Administered 2020-04-24: 2 mg via INTRAVENOUS
  Administered 2020-04-24: 1 mg via INTRAVENOUS

## 2020-04-24 SURGICAL SUPPLY — 7 items
CABLE SURGICAL S-101-97-12 (CABLE) ×2 IMPLANT
HEMOSTAT SURGICEL 2X4 FIBR (HEMOSTASIS) ×2 IMPLANT
ICD CLARIA MRI DTMA1D1 (ICD Generator) ×2 IMPLANT
PAD PRO RADIOLUCENT 2001M-C (PAD) ×2 IMPLANT
POUCH AIGIS-R ANTIBACT ICD (Mesh General) ×3 IMPLANT
POUCH AIGIS-R ANTIBACT ICD LRG (Mesh General) IMPLANT
TRAY PACEMAKER INSERTION (PACKS) ×2 IMPLANT

## 2020-04-24 NOTE — Discharge Instructions (Signed)
Implantable Cardiac Device Battery Change, Care After  This sheet gives you information about how to care for yourself after your procedure. Your health care provider may also give you more specific instructions. If you have problems or questions, contact your health care provider. What can I expect after the procedure? After your procedure, it is common to have:  Pain or soreness at the site where the cardiac device was inserted.  Swelling at the site where the cardiac device was inserted.  You should received an information card for your new device in 4-8 weeks. Follow these instructions at home: Incision care   Keep the incision clean and dry. ? Do not take baths, swim, or use a hot tub until after your wound check.  ? Do not shower for at least 7 days, or as directed by your health care provider. ? Pat the area dry with a clean towel. Do not rub the area. This may cause bleeding.  Follow instructions from your health care provider about how to take care of your incision. Make sure you: ? Leave stitches (sutures), skin glue, or adhesive strips in place. These skin closures may need to stay in place for 2 weeks or longer. If adhesive strip edges start to loosen and curl up, you may trim the loose edges. Do not remove adhesive strips completely unless your health care provider tells you to do that.  Check your incision area every day for signs of infection. Check for: ? More redness, swelling, or pain. ? More fluid or blood. ? Warmth. ? Pus or a bad smell. Activity  Do not lift anything that is heavier than 10 lb (4.5 kg) until your health care provider says it is okay to do so.  For the first week, or as long as told by your health care provider: ? Avoid lifting your affected arm higher than your shoulder. ? After 1 week, Be gentle when you move your arms over your head. It is okay to raise your arm to comb your hair. ? Avoid strenuous exercise.  Ask your health care provider  when it is okay to: ? Resume your normal activities. ? Return to work or school. ? Resume sexual activity. Eating and drinking  Eat a heart-healthy diet. This should include plenty of fresh fruits and vegetables, whole grains, low-fat dairy products, and lean protein like chicken and fish.  Limit alcohol intake to no more than 1 drink a day for non-pregnant women and 2 drinks a day for men. One drink equals 12 oz of beer, 5 oz of wine, or 1 oz of hard liquor.  Check ingredients and nutrition facts on packaged foods and beverages. Avoid the following types of food: ? Food that is high in salt (sodium). ? Food that is high in saturated fat, like full-fat dairy or red meat. ? Food that is high in trans fat, like fried food. ? Food and drinks that are high in sugar. Lifestyle  Do not use any products that contain nicotine or tobacco, such as cigarettes and e-cigarettes. If you need help quitting, ask your health care provider.  Take steps to manage and control your weight.  Once cleared, get regular exercise. Aim for 150 minutes of moderate-intensity exercise (such as walking or yoga) or 75 minutes of vigorous exercise (such as running or swimming) each week.  Manage other health problems, such as diabetes or high blood pressure. Ask your health care provider how you can manage these conditions. General instructions  Do   not drive for 24 hours after your procedure if you were given a medicine to help you relax (sedative).  Take over-the-counter and prescription medicines only as told by your health care provider.  Avoid putting pressure on the area where the cardiac device was placed.  If you need an MRI after your cardiac device has been placed, be sure to tell the health care provider who orders the MRI that you have a cardiac device.  Avoid close and prolonged exposure to electrical devices that have strong magnetic fields. These include: ? Cell phones. Avoid keeping them in a  pocket near the cardiac device, and try using the ear opposite the cardiac device. ? MP3 players. ? Household appliances, like microwaves. ? Metal detectors. ? Electric generators. ? High-tension wires.  Keep all follow-up visits as directed by your health care provider. This is important. Contact a health care provider if:  You have pain at the incision site that is not relieved by over-the-counter or prescription medicines.  You have any of these around your incision site or coming from it: ? More redness, swelling, or pain. ? Fluid or blood. ? Warmth to the touch. ? Pus or a bad smell.  You have a fever.  You feel brief, occasional palpitations, light-headedness, or any symptoms that you think might be related to your heart. Get help right away if:  You experience chest pain that is different from the pain at the cardiac device site.  You develop a red streak that extends above or below the incision site.  You experience shortness of breath.  You have palpitations or an irregular heartbeat.  You have light-headedness that does not go away quickly.  You faint or have dizzy spells.  Your pulse suddenly drops or increases rapidly and does not return to normal.  You begin to gain weight and your legs and ankles swell. Summary  After your procedure, it is common to have pain, soreness, and some swelling where the cardiac device was inserted.  Make sure to keep your incision clean and dry. Follow instructions from your health care provider about how to take care of your incision.  Check your incision every day for signs of infection, such as more pain or swelling, pus or a bad smell, warmth, or leaking fluid and blood.  Avoid strenuous exercise and lifting your left arm higher than your shoulder for 2 weeks, or as long as told by your health care provider. This information is not intended to replace advice given to you by your health care provider. Make sure you discuss  any questions you have with your health care provider.Implantable Cardiac Device Battery Change, Care After  This sheet gives you information about how to care for yourself after your procedure. Your health care provider may also give you more specific instructions. If you have problems or questions, contact your health care provider. What can I expect after the procedure? After your procedure, it is common to have:  Pain or soreness at the site where the cardiac device was inserted.  Swelling at the site where the cardiac device was inserted.  You should received an information card for your new device in 4-8 weeks. Follow these instructions at home: Incision care   Keep the incision clean and dry. ? Do not take baths, swim, or use a hot tub until after your wound check.  ? Do not shower for at least 7 days, or as directed by your health care provider. ? [place] the area  dry with a clean towel. Do not rub the area. This may cause bleeding.  Follow instructions from your health care provider about how to take care of your incision. Make sure you: ? Leave stitches (sutures), skin glue, or adhesive strips in place. These skin closures may need to stay in place for 2 weeks or longer. If adhesive strip edges start to loosen and curl up, you may trim the loose edges. Do not remove adhesive strips completely unless your health care provider tells you to do that.  Check your incision area every day for signs of infection. Check for: ? More redness, swelling, or pain. ? More fluid or blood. ? Warmth. ? Pus or a bad smell. Activity  Do not lift anything that is heavier than 10 lb (4.5 kg) until your health care provider says it is okay to do so.  For the first week, or as long as told by your health care provider: ? Avoid lifting your affected arm higher than your shoulder. ? After 1 week, Be gentle when you move your arms over your head. It is okay to raise your arm to comb your hair. ? Avoid  strenuous exercise.  Ask your health care provider when it is okay to: ? Resume your normal activities. ? Return to work or school. ? Resume sexual activity. Eating and drinking  Eat a heart-healthy diet. This should include plenty of fresh fruits and vegetables, whole grains, low-fat dairy products, and lean protein like chicken and fish.  Limit alcohol intake to no more than 1 drink a day for non-pregnant women and 2 drinks a day for men. One drink equals 12 oz of beer, 5 oz of wine, or 1 oz of hard liquor.  Check ingredients and nutrition facts on packaged foods and beverages. Avoid the following types of food: ? Food that is high in salt (sodium). ? Food that is high in saturated fat, like full-fat dairy or red meat. ? Food that is high in trans fat, like fried food. ? Food and drinks that are high in sugar. Lifestyle  Do not use any products that contain nicotine or tobacco, such as cigarettes and e-cigarettes. If you need help quitting, ask your health care provider.  Take steps to manage and control your weight.  Once cleared, get regular exercise. Aim for 150 minutes of moderate-intensity exercise (such as walking or yoga) or 75 minutes of vigorous exercise (such as running or swimming) each week.  Manage other health problems, such as diabetes or high blood pressure. Ask your health care provider how you can manage these conditions. General instructions  Do not drive for 24 hours after your procedure if you were given a medicine to help you relax (sedative).  Take over-the-counter and prescription medicines only as told by your health care provider.  Avoid putting pressure on the area where the cardiac device was placed.  If you need an MRI after your cardiac device has been placed, be sure to tell the health care provider who orders the MRI that you have a cardiac device.  Avoid close and prolonged exposure to electrical devices that have strong magnetic fields. These  include: ? Cell phones. Avoid keeping them in a pocket near the cardiac device, and try using the ear opposite the cardiac device. ? MP3 players. ? Household appliances, like microwaves. ? Metal detectors. ? Electric generators. ? High-tension wires.  Keep all follow-up visits as directed by your health care provider. This is important. Contact a  health care provider if:  You have pain at the incision site that is not relieved by over-the-counter or prescription medicines.  You have any of these around your incision site or coming from it: ? More redness, swelling, or pain. ? Fluid or blood. ? Warmth to the touch. ? Pus or a bad smell.  You have a fever.  You feel brief, occasional palpitations, light-headedness, or any symptoms that you think might be related to your heart. Get help right away if:  You experience chest pain that is different from the pain at the cardiac device site.  You develop a red streak that extends above or below the incision site.  You experience shortness of breath.  You have palpitations or an irregular heartbeat.  You have light-headedness that does not go away quickly.  You faint or have dizzy spells.  Your pulse suddenly drops or increases rapidly and does not return to normal.  You begin to gain weight and your legs and ankles swell. Summary  After your procedure, it is common to have pain, soreness, and some swelling where the cardiac device was inserted.  Make sure to keep your incision clean and dry. Follow instructions from your health care provider about how to take care of your incision.  Check your incision every day for signs of infection, such as more pain or swelling, pus or a bad smell, warmth, or leaking fluid and blood.  Avoid strenuous exercise and lifting your left arm higher than your shoulder for 2 weeks, or as long as told by your health care provider. This information is not intended to replace advice given to you by  your health care provider. Make sure you discuss any questions you have with your health care provider.Implantable Cardiac Device Battery Change, Care After  This sheet gives you information about how to care for yourself after your procedure. Your health care provider may also give you more specific instructions. If you have problems or questions, contact your health care provider. What can I expect after the procedure? After your procedure, it is common to have:  Pain or soreness at the site where the cardiac device was inserted.  Swelling at the site where the cardiac device was inserted.  You should received an information card for your new device in 4-8 weeks. Follow these instructions at home: Incision care   Keep the incision clean and dry. ? Do not take baths, swim, or use a hot tub until after your wound check.  ? Do not shower for at least 7 days, or as directed by your health care provider. ? Pat the area dry with a clean towel. Do not rub the area. This may cause bleeding.  Follow instructions from your health care provider about how to take care of your incision. Make sure you: ? Leave stitches (sutures), skin glue, or adhesive strips in place. These skin closures may need to stay in place for 2 weeks or longer. If adhesive strip edges start to loosen and curl up, you may trim the loose edges. Do not remove adhesive strips completely unless your health care provider tells you to do that.  Check your incision area every day for signs of infection. Check for: ? More redness, swelling, or pain. ? More fluid or blood. ? Warmth. ? Pus or a bad smell. Activity  Do not lift anything that is heavier than 10 lb (4.5 kg) until your health care provider says it is okay to do so.  For  the first week, or as long as told by your health care provider: ? Avoid lifting your affected arm higher than your shoulder. ? After 1 week, Be gentle when you move your arms over your head. It is  okay to raise your arm to comb your hair. ? Avoid strenuous exercise.  Ask your health care provider when it is okay to: ? Resume your normal activities. ? Return to work or school. ? Resume sexual activity. Eating and drinking  Eat a heart-healthy diet. This should include plenty of fresh fruits and vegetables, whole grains, low-fat dairy products, and lean protein like chicken and fish.  Limit alcohol intake to no more than 1 drink a day for non-pregnant women and 2 drinks a day for men. One drink equals 12 oz of beer, 5 oz of wine, or 1 oz of hard liquor.  Check ingredients and nutrition facts on packaged foods and beverages. Avoid the following types of food: ? Food that is high in salt (sodium). ? Food that is high in saturated fat, like full-fat dairy or red meat. ? Food that is high in trans fat, like fried food. ? Food and drinks that are high in sugar. Lifestyle  Do not use any products that contain nicotine or tobacco, such as cigarettes and e-cigarettes. If you need help quitting, ask your health care provider.  Take steps to manage and control your weight.  Once cleared, get regular exercise. Aim for 150 minutes of moderate-intensity exercise (such as walking or yoga) or 75 minutes of vigorous exercise (such as running or swimming) each week.  Manage other health problems, such as diabetes or high blood pressure. Ask your health care provider how you can manage these conditions. General instructions  Do not drive for 24 hours after your procedure if you were given a medicine to help you relax (sedative).  Take over-the-counter and prescription medicines only as told by your health care provider.  Avoid putting pressure on the area where the cardiac device was placed.  If you need an MRI after your cardiac device has been placed, be sure to tell the health care provider who orders the MRI that you have a cardiac device.  Avoid close and prolonged exposure to  electrical devices that have strong magnetic fields. These include: ? Cell phones. Avoid keeping them in a pocket near the cardiac device, and try using the ear opposite the cardiac device. ? MP3 players. ? Household appliances, like microwaves. ? Metal detectors. ? Electric generators. ? High-tension wires.  Keep all follow-up visits as directed by your health care provider. This is important. Contact a health care provider if:  You have pain at the incision site that is not relieved by over-the-counter or prescription medicines.  You have any of these around your incision site or coming from it: ? More redness, swelling, or pain. ? Fluid or blood. ? Warmth to the touch. ? Pus or a bad smell.  You have a fever.  You feel brief, occasional palpitations, light-headedness, or any symptoms that you think might be related to your heart. Get help right away if:  You experience chest pain that is different from the pain at the cardiac device site.  You develop a red streak that extends above or below the incision site.  You experience shortness of breath.  You have palpitations or an irregular heartbeat.  You have light-headedness that does not go away quickly.  You faint or have dizzy spells.  Your pulse suddenly  drops or increases rapidly and does not return to normal.  You begin to gain weight and your legs and ankles swell. Summary  After your procedure, it is common to have pain, soreness, and some swelling where the cardiac device was inserted.  Make sure to keep your incision clean and dry. Follow instructions from your health care provider about how to take care of your incision.  Check your incision every day for signs of infection, such as more pain or swelling, pus or a bad smell, warmth, or leaking fluid and blood.  Avoid strenuous exercise and lifting your left arm higher than your shoulder for 2 weeks, or as long as told by your health care provider. This  information is not intended to replace advice given to you by your health care provider. Make sure you discuss any questions you have with your health care provider.

## 2020-04-24 NOTE — H&P (Addendum)
ICD Criteria  Current LVEF: 25%. Within 12 months prior to implant: No   Heart failure history: Yes, Class III  Cardiomyopathy history: Yes, Non-Ischemic Cardiomyopathy.  Atrial Fibrillation/Atrial Flutter: Yes, Permanent.  Ventricular tachycardia history: No.  Cardiac arrest history: No.  History of syndromes with risk of sudden death: No.  Previous ICD: Yes, Reason for ICD:  Primary prevention.  Current ICD indication: Secondary  PPM indication: Yes. Pacing type: Ventricular. Greater than 40% RV pacing requirement anticipated. Indication: Complete Heart Block  Class I or II Bradycardia indication present: Yes  Beta Blocker therapy for 3 or more months: Yes, prescribed.   Ace Inhibitor/ARB therapy for 3 or more months: Yes, prescribed.    I have seen [name] is a 74 y.o. malepre-procedural and has been referred by PJ for consideration of ICD implant for primary prevention of sudden death.  The patient's chart has been reviewed and they meet criteria for ICD implant.  I have had a thorough discussion with the patient reviewing options.  The patient and their family (if available) have had opportunities to ask questions and have them answered. The patient and I have decided together through the [place] [employer] to reimplant ICD at this time.  Risks, benefits, alternatives to ICD implantation were discussed in detail with the patient today. The patient  understands that the risks include but are not limited to bleeding, infection, pneumothorax, perforation, tamponade, vascular damage, renal failure, MI, stroke, death, inappropriate shocks, and lead dislodgement and shes to proceed.         Patient Care Team: [name], MD as PCP - General (Family Medicine) Martinique, Peter M, MD as PCP - Cardiology (Cardiology) [name], MD as PCP - Electrophysiology (Cardiology) [name], MD as Consulting Physician  (Cardiology)   HPI  [name] is a 74 y.o. male with cardiomyopathy with HFrEF and prior CRT-D implantation.  He also has afib for which he saw Dr Greggory Brandy and the was decision to not pursue PVI   underwent AV junction ablation 2/18   Chronic SOB; no chest pain     DATE TEST EF   1/17    Echo  20.25 %   10/18 Echo  25-30%           Date Cr K Dig Hgb  4/18 1.43 3.4    11/18 1.42 4.6    12/19 1.79 3.7 (0.4) 12  7/20 1.39 3.7  12.0  5/21 1.67 4.0  12.1     Records and Results Reviewed   Past Medical History:  Diagnosis Date  . AICD (automatic cardioverter/defibrillator) present   . Anemia   . Anxiety   . CHF (congestive heart failure) ([place])   . Chronic renal insufficiency   . Coronary artery disease   . Depression   . Dyslipidemia   . Dysrhythmia    atrial fibrillation  . ED (erectile dysfunction)   . GERD (gastroesophageal reflux disease)   . Incisional hernia, without obstruction or gangrene 04/10/2017  . Ischemic cardiomyopathy    EF 23%  . MI (myocardial infarction) ([place]) 2000   ANTERIOR, s/p PCI  . Other primary cardiomyopathies   . Peripheral vascular disease ([place])    dvt legs 2017  . Persistent atrial fibrillation (HCC)    on coumadin  . Presence of permanent cardiac pacemaker   . Shortness of breath dyspnea    on exertion    Past Surgical History:  Procedure Laterality  Date  . AV NODE ABLATION N/A 01/25/2017   Procedure: AV Node Ablation;  Surgeon: [name], MD;  Location: [place] [facility];  Service: Cardiovascular;  Laterality: N/A;  . AV NODE ABLATION  01/25/2017  . BIV ICD GENERTAOR CHANGE OUT N/A 09/16/2013   Procedure: BIV ICD GENERTAOR CHANGE OUT;  Surgeon: [name], MD;  Location: [facility] CATH LAB;  Service: Cardiovascular;  Laterality: N/A;  . BREAST LUMPECTOMY Left   . CARDIAC CATHETERIZATION  02/05/2008   MODERATE TO SEVERE LEFT VENTRICULAR  DYSFUNCTION WITH EF 25-30%  . CARDIAC DEFIBRILLATOR PLACEMENT  2009   MDT  BI-VENTRICULAR ICD by Dr Caryl Comes  . COLONOSCOPY WITH PROPOFOL N/A 08/31/2016   Procedure: COLONOSCOPY WITH PROPOFOL;  Surgeon: Wilford Corner, MD;  Location: [facility] ENDOSCOPY;  Service: Endoscopy;  Laterality: N/A;  . CORONARY ANGIOPLASTY  2000   stent  . CORONARY STENT PLACEMENT     LAD  . CYSTOSCOPY N/A 01/25/2016   Procedure: CYSTOSCOPY;  Surgeon: Carolan Clines, MD;  [facility]: [facility];  Service: Urology;  Laterality: N/A;  . ESOPHAGOGASTRODUODENOSCOPY (EGD) WITH PROPOFOL N/A 08/31/2016   Procedure: ESOPHAGOGASTRODUODENOSCOPY (EGD) WITH PROPOFOL;  Surgeon: Wilford Corner, MD;  Location: [facility] ENDOSCOPY;  Service: Endoscopy;  Laterality: N/A;  . FRACTURE SURGERY Right 1972   leg  . IMPLANTABLE CARDIOVERTER DEFIBRILLATOR GENERATOR CHANGE  2014  . INCISIONAL HERNIA REPAIR N/A 04/10/2017   Procedure: OPEN REPAIR INCISIONAL VENTRAL HERNIA;  Surgeon: [name], MD;  Location: [place];  Service: General;  Laterality: N/A;  . INSERT / REPLACE / REMOVE PACEMAKER     ICD  . INSERTION OF MESH N/A 04/10/2017   Procedure: INSERTION OF MESH;  Surgeon: [name], MD;  Location: [place];  Service: General;  Laterality: N/A;  . LAPAROTOMY N/A 02/22/2016   Procedure: EXPLORATORY LAPAROTOMY WITH  PATCH OF DUODENAL ULCER;  Surgeon: Rolm Bookbinder, MD;  Location: [place];  Service: General;  Laterality: N/A;  . MULTIPLE TOOTH EXTRACTIONS    . TRANSTHORACIC ECHOCARDIOGRAM  12/2010   EF 30-35%  . TRANSURETHRAL RESECTION OF PROSTATE N/A 01/25/2016   Procedure: TRANSURETHRAL RESECTION OF THE PROSTATE (TURP);  Surgeon: Carolan Clines, MD;  [facility]: [facility];  Service: Urology;  Laterality: N/A;  . WRIST FRACTURE SURGERY Left 1972    Current Facility-Administered Medications  Medication Dose Route Frequency Provider Last Rate Last Admin  . 0.9 %  sodium chloride infusion   Intravenous Continuous [name], MD 50 mL/hr at 04/24/20 1026 New Bag at 04/24/20 1026  . chlorhexidine (HIBICLENS) 4 % liquid 4  application  4 application Topical Once [name], MD      . gentamicin (GARAMYCIN) 80 mg in sodium chloride 0.9 % 500 mL irrigation  80 mg Irrigation On Call [name], MD      . vancomycin (VANCOCIN) IVPB 1000 mg/200 mL premix  1,000 mg Intravenous On Call [name], MD        Allergies  Allergen Reactions  . Keflex [Cephalexin] Shortness Of Breath and Other (See Comments)    dizziness      Social History   Tobacco Use  . Smoking status: Former Smoker    Types: Cigarettes    Quit date: 12/23/1998    Years since quitting: 21.3  . Smokeless tobacco: Never Used  Substance Use Topics  . Alcohol use: No  . Drug use: No     Family History  Problem Relation Age of Onset  . Breast cancer Mother   .  Depression Mother   . Heart attack Father   . Breast cancer Sister      Current Meds  Medication Sig  . acetaminophen (TYLENOL) 500 MG tablet Take 500 mg by mouth every 6 (six) hours as needed for mild pain or moderate pain.  Marland Kitchen digoxin (LANOXIN) 0.125 MG tablet Take 0.125 mg by mouth daily.   . fenofibrate 160 MG tablet TAKE 1 TABLET BY MOUTH  DAILY (Patient taking differently: Take 160 mg by mouth daily at 2 PM. )  . ferrous sulfate 325 (65 FE) MG tablet Take 325 mg by mouth 2 (two) times daily.  . folic acid (FOLVITE) [id number] MCG tablet Take 800 mcg by mouth every evening.   . furosemide (LASIX) 40 MG tablet TAKE 2 TABLETS BY MOUTH IN  THE MORNING AND 1 TABLET IN THE EVENING  . losartan (COZAAR) 25 MG tablet TAKE 1 TABLET BY MOUTH  DAILY (Patient taking differently: Take 25 mg by mouth daily. )  . metoprolol succinate (TOPROL-XL) 25 MG 24 hr tablet TAKE 1 TABLET BY MOUTH  DAILY AT 12 NOON. (Patient taking differently: Take 25 mg by mouth daily. )  . Multiple Vitamins-Minerals (MULTIVITAMIN ADULTS PO) Take 1 tablet by mouth daily. Senior  . nitroGLYCERIN (NITROSTAT) 0.4 MG SL tablet Place 1 tablet (0.4 mg total) under the tongue every 5 (five) minutes as needed for chest  pain.  . pantoprazole (PROTONIX) 40 MG tablet TAKE 2 TABLETS BY MOUTH  DAILY (Patient taking differently: Take 40 mg by mouth 2 (two) times daily. )  . potassium chloride SA (KLOR-CON) 20 MEQ tablet TAKE 1 TABLET BY MOUTH  DAILY. TAKE ADDITIONAL  TABLET WHEN TAKING EXTRA  LASIX (FUROSEMIDE) (Patient taking differently: Take 70 mEq by mouth. )  . pravastatin (PRAVACHOL) 40 MG tablet TAKE 1 TABLET BY MOUTH  DAILY  . sertraline (ZOLOFT) 100 MG tablet Take 150 mg by mouth daily at 2 PM.   . vitamin C (ASCORBIC ACID) 250 MG tablet Take 250 mg by mouth 2 (two) times daily.  Marland Kitchen warfarin (COUMADIN) 1 MG tablet Take 3 mg by mouth at bedtime. 2100-2200  . [DISCONTINUED] furosemide (LASIX) 40 MG tablet TAKE 2 TABLETS BY MOUTH IN  THE MORNING AND 1 TABLET IN THE EVENING (Patient taking differently: Take 40-80 mg by mouth 2 (two) times daily. Take 80 mg in the morning and 40 in the afternoon)  . [DISCONTINUED] pravastatin (PRAVACHOL) 40 MG tablet TAKE 1 TABLET BY MOUTH  DAILY (Patient taking differently: Take 40 mg by mouth at bedtime. \)     Review of Systems negative except from HPI and PMH  Physical Exam BP 129/67   Pulse 73   Temp (!) 97.3 F (36.3 C) (Skin)   Resp 16   Ht 5\' 10"  (1.778 m)   Wt 95.7 kg   SpO2 100%   BMI 30.28 kg/m  Well developed and well nourished in no acute distress HENT normal E scleral and icterus clear Neck Supple JVP flat; carotids brisk and full Clear to ausculation  Regular rate and rhythm, no murmurs gallops or rub Soft with active bowel sounds No clubbing cyanosis 2+ Edema Alert and oriented, grossly normal motor and sensory function Skin Warm and Dry    Assessment and  Plan Atrial fibrillation  -permanent  Complete heart block status post AV ablation  Congestive heart failure-chronic-systolic  Ischemic cardiomyopathy  Renal insufficiency grade 3  CRT- D  At ERI  We have reviewed the benefits and risks of  generator replacement.  These include  but are not limited to lead fracture and infection.  The patient understands, agrees and is willing to proceed.    Will need dig level

## 2020-04-24 NOTE — Progress Notes (Signed)
Patient and wife were given discharge instructions. Both verbalized understanding. 

## 2020-04-27 MED FILL — Lidocaine HCl Local Inj 1%: INTRAMUSCULAR | Qty: 20 | Status: AC

## 2020-04-27 MED FILL — Heparin Sod (Porcine)-NaCl IV Soln 1000 Unit/500ML-0.9%: INTRAVENOUS | Qty: 500 | Status: AC

## 2020-04-27 MED FILL — Lidocaine HCl Local Inj 1%: INTRAMUSCULAR | Qty: 40 | Status: AC

## 2020-05-01 ENCOUNTER — Ambulatory Visit
Admission: RE | Admit: 2020-05-01 | Discharge: 2020-05-01 | Disposition: A | Discharge status: Home / Self Care | Payer: Medicare Other | Source: Ambulatory Visit | Attending: Family Medicine | Admitting: Family Medicine

## 2020-05-01 ENCOUNTER — Telehealth: Payer: Self-pay

## 2020-05-01 ENCOUNTER — Other Ambulatory Visit: Payer: Self-pay | Admitting: Family Medicine

## 2020-05-01 DIAGNOSIS — M7989 Other specified soft tissue disorders: Secondary | ICD-10-CM

## 2020-05-01 NOTE — Telephone Encounter (Signed)
Spoke with patient to remind of missed remote transmission 

## 2020-05-05 ENCOUNTER — Other Ambulatory Visit: Payer: Self-pay

## 2020-05-05 ENCOUNTER — Ambulatory Visit (INDEPENDENT_AMBULATORY_CARE_PROVIDER_SITE_OTHER): Payer: Medicare Other | Admitting: Emergency Medicine

## 2020-05-05 DIAGNOSIS — I255 Ischemic cardiomyopathy: Secondary | ICD-10-CM

## 2020-05-05 DIAGNOSIS — Z95810 Presence of automatic (implantable) cardiac defibrillator: Secondary | ICD-10-CM

## 2020-05-05 LAB — CUP PACEART INCLINIC DEVICE CHECK
Battery Remaining Longevity: 91 mo
Battery Voltage: 3.1 V
Brady Statistic AP VP Percent: 0 %
Brady Statistic AP VS Percent: 0 %
Brady Statistic AS VP Percent: 0 %
Brady Statistic AS VS Percent: 0 %
Brady Statistic RA Percent Paced: 0 %
Brady Statistic RV Percent Paced: 96.84 %
Date Time Interrogation Session: 20210518112223
HighPow Impedance: 48 Ohm
HighPow Impedance: 64 Ohm
Implantable Lead Implant Date: 20090226
Implantable Lead Implant Date: 20090226
Implantable Lead Implant Date: 20090226
Implantable Lead Location: 753858
Implantable Lead Location: 753859
Implantable Lead Location: 753860
Implantable Lead Model: 4194
Implantable Lead Model: 5076
Implantable Lead Model: 6947
Implantable Pulse Generator Implant Date: 20210507
Lead Channel Impedance Value: 247 Ohm
Lead Channel Impedance Value: 304 Ohm
Lead Channel Impedance Value: 342 Ohm
Lead Channel Impedance Value: 513 Ohm
Lead Channel Impedance Value: 589 Ohm
Lead Channel Impedance Value: 760 Ohm
Lead Channel Pacing Threshold Amplitude: 0.75 V
Lead Channel Pacing Threshold Amplitude: 1.25 V
Lead Channel Pacing Threshold Pulse Width: 0.4 ms
Lead Channel Pacing Threshold Pulse Width: 0.6 ms
Lead Channel Setting Pacing Amplitude: 2.25 V
Lead Channel Setting Pacing Amplitude: 2.5 V
Lead Channel Setting Pacing Pulse Width: 0.4 ms
Lead Channel Setting Pacing Pulse Width: 0.6 ms
Lead Channel Setting Sensing Sensitivity: 0.3 mV

## 2020-05-05 NOTE — Progress Notes (Signed)
Wound check appointment. Dermabond partially removed. Wound without redness or edema. Incision edges approximated, wound well healed. Normal device function. Patient is BiV pacing 96.8%.   Known permanent AF on Coumadin for [place], programmed VVIR.  RV and LV Thresholds, sensing, and impedances consistent with implant measurements. Device programmed within safety margins for chronic leads.  Histogram distribution appropriate for patient and level of activity. No ventricular arrhythmias noted. Patient educated about wound care, arm mobility, lifting restrictions, shock plan. ROV with Dr. Caryl Comes 08/04/20.  Next remote check 06/08/20.

## 2020-06-08 ENCOUNTER — Ambulatory Visit (INDEPENDENT_AMBULATORY_CARE_PROVIDER_SITE_OTHER): Payer: Medicare Other

## 2020-06-08 DIAGNOSIS — I5022 Chronic systolic (congestive) heart failure: Secondary | ICD-10-CM

## 2020-06-08 DIAGNOSIS — Z95810 Presence of automatic (implantable) cardiac defibrillator: Secondary | ICD-10-CM

## 2020-06-09 ENCOUNTER — Other Ambulatory Visit: Payer: Self-pay

## 2020-06-09 ENCOUNTER — Ambulatory Visit (INDEPENDENT_AMBULATORY_CARE_PROVIDER_SITE_OTHER): Payer: Medicare Other

## 2020-06-09 ENCOUNTER — Ambulatory Visit: Payer: Medicare Other

## 2020-06-09 ENCOUNTER — Telehealth: Payer: Self-pay | Admitting: *Deleted

## 2020-06-09 ENCOUNTER — Ambulatory Visit: Payer: Medicare Other | Admitting: Podiatry

## 2020-06-09 DIAGNOSIS — M779 Enthesopathy, unspecified: Secondary | ICD-10-CM

## 2020-06-09 DIAGNOSIS — M778 Other enthesopathies, not elsewhere classified: Secondary | ICD-10-CM

## 2020-06-09 DIAGNOSIS — R0989 Other specified symptoms and signs involving the circulatory and respiratory systems: Secondary | ICD-10-CM

## 2020-06-09 DIAGNOSIS — Z7901 Long term (current) use of anticoagulants: Secondary | ICD-10-CM

## 2020-06-09 DIAGNOSIS — M216X9 Other acquired deformities of unspecified foot: Secondary | ICD-10-CM

## 2020-06-09 DIAGNOSIS — L84 Corns and callosities: Secondary | ICD-10-CM

## 2020-06-09 DIAGNOSIS — B351 Tinea unguium: Secondary | ICD-10-CM | POA: Diagnosis not present

## 2020-06-09 DIAGNOSIS — M7750 Other enthesopathy of unspecified foot: Secondary | ICD-10-CM

## 2020-06-09 DIAGNOSIS — R609 Edema, unspecified: Secondary | ICD-10-CM

## 2020-06-09 NOTE — Telephone Encounter (Signed)
Faxed orders to for arterial dopplers to [facility].

## 2020-06-09 NOTE — Telephone Encounter (Signed)
-----   Message from Trula Slade, DPM sent at 06/09/2020 10:59 AM EDT ----- Can you please order arterial studies due to abnormal abi in the office on the left? Has leg pain and ankle swelling. Thanks.

## 2020-06-10 NOTE — Progress Notes (Signed)
EPIC Encounter for ICM Monitoring  [name] is a 74 y.o. male Date: 06/10/2020 Primary Care Physican: [name], MD Primary [place] Electrophysiologist: Vergie Living Pacing: 94.7% 06/10/2020 Weight:210-213 lbs  Spoke with patient and reports feeling well at this time.  Denies fluid symptoms.    Optivol thoracic impedancenormal.  Prescribed:   Furosemide40 mgTake 2 tablets ( 80 mg ) in am and 1 tablet ( 40 mg ) inPM.  Potassium 20 mEqTAKE 1 TABLET BY MOUTH DAILY. TAKE ADDITIONAL TABLET WHEN TAKING EXTRA LASIX  Labs: 04/21/2020 Creatinine 1.67, BUN 29, Potassium 4.0, Sodium 141, GFR 40-47 A complete set of results can be found in Results Review.  Recommendations:No changes and encouraged to call if experiencing any fluid symptoms.  Follow-up plan: ICM clinic phone appointment on7/26/2021. 91 day device clinic remote transmission8/05/2020.  Office appt 08/04/2020 with Dr. Caryl Comes.    Copy of ICM check sent to Dr. Caryl Comes.   3 month ICM trend: 06/08/2020    1 Year ICM trend:       [name], RN 06/10/2020 12:41 PM

## 2020-06-12 ENCOUNTER — Other Ambulatory Visit: Payer: Self-pay | Admitting: Podiatry

## 2020-06-12 DIAGNOSIS — M778 Other enthesopathies, not elsewhere classified: Secondary | ICD-10-CM

## 2020-06-12 DIAGNOSIS — M7750 Other enthesopathy of unspecified foot: Secondary | ICD-10-CM

## 2020-06-14 NOTE — Progress Notes (Signed)
Subjective:   Patient ID: [name], male   DOB: 74 y.o.   MRN: [id number]   HPI 74 year old male presents the office today for concerns of swelling to both of his legs with the left side worse than the right.  He states he does not wear his compression socks today.  He is on Lasix due to heart attack in 1999.  Also has a pacemaker.  He states he has poor circulation to his legs he thinks.  He is also had skin lesions the bottom of his feet on both sides.  They do cause discomfort at times.  Also his nails are thick and discolored.  No redness or drainage in the toenail callus sites.   Review of Systems  All other systems reviewed and are negative.  Past Medical History:  Diagnosis Date  . AICD (automatic cardioverter/defibrillator) present   . Anemia   . Anxiety   . CHF (congestive heart failure) ([place])   . Chronic renal insufficiency   . Coronary artery disease   . Depression   . Dyslipidemia   . Dysrhythmia    atrial fibrillation  . ED (erectile dysfunction)   . GERD (gastroesophageal reflux disease)   . Incisional hernia, without obstruction or gangrene 04/10/2017  . Ischemic cardiomyopathy    EF 23%  . MI (myocardial infarction) ([place]) 2000   ANTERIOR, s/p PCI  . Other primary cardiomyopathies   . Peripheral vascular disease ([place])    dvt legs 2017  . Persistent atrial fibrillation (HCC)    on coumadin  . Presence of permanent cardiac pacemaker   . Shortness of breath dyspnea    on exertion    Past Surgical History:  Procedure Laterality Date  . AV NODE ABLATION N/A 01/25/2017   Procedure: AV Node Ablation;  Surgeon: [name], MD;  Location: [place] [facility];  Service: Cardiovascular;  Laterality: N/A;  . AV NODE ABLATION  01/25/2017  . BIV ICD GENERATOR CHANGEOUT N/A 04/24/2020   Procedure: BIV ICD GENERATOR CHANGEOUT;  Surgeon: [name], MD;  Location: [place] [facility];  Service: Cardiovascular;  Laterality: N/A;  . BIV ICD GENERTAOR CHANGE OUT N/A  09/16/2013   Procedure: BIV ICD GENERTAOR CHANGE OUT;  Surgeon: [name], MD;  Location: [facility] CATH LAB;  Service: Cardiovascular;  Laterality: N/A;  . BREAST LUMPECTOMY Left   . CARDIAC CATHETERIZATION  02/05/2008   MODERATE TO SEVERE LEFT VENTRICULAR  DYSFUNCTION WITH EF 25-30%  . CARDIAC DEFIBRILLATOR PLACEMENT  2009   MDT BI-VENTRICULAR ICD by Dr Caryl Comes  . COLONOSCOPY WITH PROPOFOL N/A 08/31/2016   Procedure: COLONOSCOPY WITH PROPOFOL;  Surgeon: Wilford Corner, MD;  Location: [facility] ENDOSCOPY;  Service: Endoscopy;  Laterality: N/A;  . CORONARY ANGIOPLASTY  2000   stent  . CORONARY STENT PLACEMENT     LAD  . CYSTOSCOPY N/A 01/25/2016   Procedure: CYSTOSCOPY;  Surgeon: Carolan Clines, MD;  [facility];  Service: Urology;  Laterality: N/A;  . ESOPHAGOGASTRODUODENOSCOPY (EGD) WITH PROPOFOL N/A 08/31/2016   Procedure: ESOPHAGOGASTRODUODENOSCOPY (EGD) WITH PROPOFOL;  Surgeon: Wilford Corner, MD;  Location: [facility] ENDOSCOPY;  Service: Endoscopy;  Laterality: N/A;  . FRACTURE SURGERY Right 1972   leg  . IMPLANTABLE CARDIOVERTER DEFIBRILLATOR GENERATOR CHANGE  2014  . INCISIONAL HERNIA REPAIR N/A 04/10/2017   Procedure: OPEN REPAIR INCISIONAL VENTRAL HERNIA;  Surgeon: [name], MD;  Location: [place];  Service: General;  Laterality: N/A;  . INSERT / REPLACE / REMOVE PACEMAKER  ICD  . INSERTION OF MESH N/A 04/10/2017   Procedure: INSERTION OF MESH;  Surgeon: [name], MD;  Location: [place];  Service: General;  Laterality: N/A;  . LAPAROTOMY N/A 02/22/2016   Procedure: EXPLORATORY LAPAROTOMY WITH  PATCH OF DUODENAL ULCER;  Surgeon: Rolm Bookbinder, MD;  Location: [place];  Service: General;  Laterality: N/A;  . MULTIPLE TOOTH EXTRACTIONS    . TRANSTHORACIC ECHOCARDIOGRAM  12/2010   EF 30-35%  . TRANSURETHRAL RESECTION OF PROSTATE N/A 01/25/2016   Procedure: TRANSURETHRAL RESECTION OF THE PROSTATE (TURP);  Surgeon: Carolan Clines, MD;  [facility];  Service: Urology;   Laterality: N/A;  . WRIST FRACTURE SURGERY Left 1972     Current Outpatient Medications:  .  acetaminophen (TYLENOL) 500 MG tablet, Take 500 mg by mouth every 6 (six) hours as needed for mild pain or moderate pain., Disp: , Rfl:  .  digoxin (LANOXIN) 0.125 MG tablet, Take 0.125 mg by mouth daily. , Disp: 90 tablet, Rfl: 3 .  fenofibrate 160 MG tablet, TAKE 1 TABLET BY MOUTH  DAILY (Patient taking differently: Take 160 mg by mouth daily at 2 PM. ), Disp: 90 tablet, Rfl: 2 .  ferrous sulfate 325 (65 FE) MG tablet, Take 325 mg by mouth 2 (two) times daily., Disp: , Rfl:  .  folic acid (FOLVITE) 789 MCG tablet, Take 800 mcg by mouth every evening. , Disp: , Rfl:  .  furosemide (LASIX) 40 MG tablet, TAKE 2 TABLETS BY MOUTH IN  THE MORNING AND 1 TABLET IN THE EVENING, Disp: 270 tablet, Rfl: 3 .  losartan (COZAAR) 25 MG tablet, TAKE 1 TABLET BY MOUTH  DAILY (Patient taking differently: Take 25 mg by mouth daily. ), Disp: 90 tablet, Rfl: 3 .  metoprolol succinate (TOPROL-XL) 25 MG 24 hr tablet, TAKE 1 TABLET BY MOUTH  DAILY AT 12 NOON. (Patient taking differently: Take 25 mg by mouth daily. ), Disp: 90 tablet, Rfl: 2 .  Multiple Vitamins-Minerals (MULTIVITAMIN ADULTS PO), Take 1 tablet by mouth daily. Senior, Disp: , Rfl:  .  nitroGLYCERIN (NITROSTAT) 0.4 MG SL tablet, Place 1 tablet (0.4 mg total) under the tongue every 5 (five) minutes as needed for chest pain., Disp: 25 tablet, Rfl: 11 .  pantoprazole (PROTONIX) 40 MG tablet, TAKE 2 TABLETS BY MOUTH  DAILY (Patient taking differently: Take 40 mg by mouth 2 (two) times daily. ), Disp: 180 tablet, Rfl: 3 .  potassium chloride SA (KLOR-CON) 20 MEQ tablet, TAKE 1 TABLET BY MOUTH  DAILY. TAKE ADDITIONAL  TABLET WHEN TAKING EXTRA  LASIX (FUROSEMIDE) (Patient taking differently: Take 70 mEq by mouth. ), Disp: 135 tablet, Rfl: 3 .  pravastatin (PRAVACHOL) 40 MG tablet, TAKE 1 TABLET BY MOUTH  DAILY, Disp: 90 tablet, Rfl: 3 .  sertraline (ZOLOFT) 100 MG tablet,  Take 150 mg by mouth daily at 2 PM. , Disp: , Rfl:  .  vitamin C (ASCORBIC ACID) 250 MG tablet, Take 250 mg by mouth 2 (two) times daily., Disp: , Rfl:  .  warfarin (COUMADIN) 1 MG tablet, Take 3 mg by mouth at bedtime. 2100-2200, Disp: , Rfl:   Allergies  Allergen Reactions  . Keflex [Cephalexin] Shortness Of Breath and Other (See Comments)    dizziness          Objective:  Physical Exam  General: AAO x3, NAD  Dermatological: Hyperkeratotic lesion submetatarsal area bilaterally without any underlying ulceration drainage or signs of infection.  Nails appear to be hypertrophic, dystrophic  with yellow-brown discoloration.  No redness or drainage or any signs of infection to the toenail sites.  Vascular: DP, PT pulses are palpable on the right side with decreased in the left side.  CRT less than 3 seconds. There is no pain with calf compression, swelling, warmth, erythema.   Neruologic: Grossly intact via light touch bilateral.   Musculoskeletal: Prominence results.  MMT 5/5.    Assessment:   74 year old male hyperkeratotic lesions, onychomycosis, PAD     Plan:  -Treatment options discussed including all alternatives, risks, and complications -X-rays obtained reviewed.  No evidence of acute fracture identified. -Etiology of symptoms were discussed -I did an ABI today.  The left side was read as "PAD" and the right was 1.23.  Order arterial studies given this as well as leg pain and swelling. -Debrided hyperkeratotic lesion slightly complications or bleeding.  Debrided Complications as courtesy.  Discussed treatment options for nail fungus but I want to make sure her circulation is adequate.  No follow-ups on file.  Trula Slade DPM

## 2020-06-18 ENCOUNTER — Other Ambulatory Visit: Payer: Self-pay | Admitting: Podiatry

## 2020-06-18 DIAGNOSIS — R0989 Other specified symptoms and signs involving the circulatory and respiratory systems: Secondary | ICD-10-CM

## 2020-06-18 DIAGNOSIS — M779 Enthesopathy, unspecified: Secondary | ICD-10-CM

## 2020-06-25 ENCOUNTER — Other Ambulatory Visit: Payer: Self-pay

## 2020-06-25 ENCOUNTER — Ambulatory Visit (HOSPITAL_COMMUNITY)
Admission: RE | Admit: 2020-06-25 | Discharge: 2020-06-25 | Disposition: A | Discharge status: Home / Self Care | Payer: Medicare Other | Source: Ambulatory Visit | Attending: Cardiology | Admitting: Cardiology

## 2020-06-25 DIAGNOSIS — R0989 Other specified symptoms and signs involving the circulatory and respiratory systems: Secondary | ICD-10-CM

## 2020-06-25 DIAGNOSIS — M779 Enthesopathy, unspecified: Secondary | ICD-10-CM

## 2020-06-29 ENCOUNTER — Other Ambulatory Visit: Payer: Self-pay

## 2020-06-29 MED ORDER — DIGOXIN 125 MCG PO TABS: 0.1250 mg | ORAL_TABLET | Freq: Every day | ORAL | 3 refills | Status: AC

## 2020-06-30 ENCOUNTER — Other Ambulatory Visit: Payer: Self-pay

## 2020-06-30 ENCOUNTER — Encounter: Payer: Self-pay | Admitting: Cardiovascular Disease

## 2020-06-30 ENCOUNTER — Ambulatory Visit: Payer: Medicare Other | Admitting: Cardiovascular Disease

## 2020-06-30 ENCOUNTER — Telehealth: Payer: Self-pay | Admitting: *Deleted

## 2020-06-30 VITALS — BP 110/62 | HR 82 | Ht 70.0 in | Wt 217.6 lb

## 2020-06-30 DIAGNOSIS — I739 Peripheral vascular disease, unspecified: Secondary | ICD-10-CM

## 2020-06-30 DIAGNOSIS — E785 Hyperlipidemia, unspecified: Secondary | ICD-10-CM | POA: Diagnosis not present

## 2020-06-30 DIAGNOSIS — I872 Venous insufficiency (chronic) (peripheral): Secondary | ICD-10-CM

## 2020-06-30 DIAGNOSIS — I5022 Chronic systolic (congestive) heart failure: Secondary | ICD-10-CM | POA: Diagnosis not present

## 2020-06-30 NOTE — Telephone Encounter (Signed)
-----   Message from Trula Slade, DPM sent at 06/28/2020  9:57 AM EDT ----- Val- blood flow is decreased. I sent on the other result to refer to vascular given the abnormal study but it looks like the patient has an appointment to see Dr. Fletcher Anon on Tuesday.

## 2020-06-30 NOTE — Progress Notes (Signed)
Cardiology Office Note   Date:  06/30/2020   ID:  [name], [name] 06/12/1946, MRN [id number]  PCP:  [name], MD  Cardiologist:  Dr. Martinique  Chief Complaint  Patient presents with  . Edema    LEFT ANKLE      History of Present Illness: [name] is a 74 y.o. male who was referred by Dr. Jacqualyn Posey for evaluation management of peripheral arterial disease. He has known history of coronary artery disease status post remote anterior MI, chronic systolic heart failure due to ischemic cardiomyopathy status post CRT-D, atrial fibrillation and hyperlipidemia.  He is status post AV nodal ablation in February 2018 due to atrial fibrillation.  The patient reports prolonged history of bilateral leg swelling which worsened recently.  The symptoms are worse on the left than the right.  He has no significant left calf claudication.  He does describe some cramping in his left thigh especially at night when he is in bed.  Symptoms improved with magnesium supplement.  He is also limited by right knee joint discomfort.  He underwent recent noninvasive vascular evaluation which showed noncompressible vessels.  TBI was 0.79 on the right and 0.49 on the left.Distal waveforms were triphasic on the right and monophasic on the left.  Duplex showed mild nonobstructive disease on the right side.  On the left, the SFA was occluded as well as except tibial artery.  Past Medical History:  Diagnosis Date  . AICD (automatic cardioverter/defibrillator) present   . Anemia   . Anxiety   . CHF (congestive heart failure) ([place])   . Chronic renal insufficiency   . Coronary artery disease   . Depression   . Dyslipidemia   . Dysrhythmia    atrial fibrillation  . ED (erectile dysfunction)   . GERD (gastroesophageal reflux disease)   . Incisional hernia, without obstruction or gangrene 04/10/2017  . Ischemic cardiomyopathy    EF 23%  . MI (myocardial infarction) ([place]) 2000   ANTERIOR, s/p PCI  .  Other primary cardiomyopathies   . Peripheral vascular disease ([place])    dvt legs 2017  . Persistent atrial fibrillation (HCC)    on coumadin  . Presence of permanent cardiac pacemaker   . Shortness of breath dyspnea    on exertion    Past Surgical History:  Procedure Laterality Date  . AV NODE ABLATION N/A 01/25/2017   Procedure: AV Node Ablation;  Surgeon: [name], MD;  Location: [place] CV LAB;  Service: Cardiovascular;  Laterality: N/A;  . AV NODE ABLATION  01/25/2017  . BIV ICD GENERATOR CHANGEOUT N/A 04/24/2020   Procedure: BIV ICD GENERATOR CHANGEOUT;  Surgeon: [name], MD;  Location: [place] [facility] LAB;  Service: Cardiovascular;  Laterality: N/A;  . BIV ICD GENERTAOR CHANGE OUT N/A 09/16/2013   Procedure: BIV ICD GENERTAOR CHANGE OUT;  Surgeon: [name], MD;  [facility] CATH LAB;  Service: Cardiovascular;  Laterality: N/A;  . BREAST LUMPECTOMY Left   . CARDIAC CATHETERIZATION  02/05/2008   MODERATE TO SEVERE LEFT VENTRICULAR  DYSFUNCTION WITH EF 25-30%  . CARDIAC DEFIBRILLATOR PLACEMENT  2009   MDT BI-VENTRICULAR ICD by Dr Caryl Comes  . COLONOSCOPY WITH PROPOFOL N/A 08/31/2016   Procedure: COLONOSCOPY WITH PROPOFOL;  Surgeon: Wilford Corner, MD;  Location: [facility] ENDOSCOPY;  Service: Endoscopy;  Laterality: N/A;  . CORONARY ANGIOPLASTY  2000   stent  . CORONARY STENT PLACEMENT     LAD  . CYSTOSCOPY N/A  01/25/2016   Procedure: CYSTOSCOPY;  Surgeon: Carolan Clines, MD;  [facility]: [facility];  Service: Urology;  Laterality: N/A;  . ESOPHAGOGASTRODUODENOSCOPY (EGD) WITH PROPOFOL N/A 08/31/2016   Procedure: ESOPHAGOGASTRODUODENOSCOPY (EGD) WITH PROPOFOL;  Surgeon: Wilford Corner, MD;  Location: [facility] ENDOSCOPY;  Service: Endoscopy;  Laterality: N/A;  . FRACTURE SURGERY Right 1972   leg  . IMPLANTABLE CARDIOVERTER DEFIBRILLATOR GENERATOR CHANGE  2014  . INCISIONAL HERNIA REPAIR N/A 04/10/2017   Procedure: OPEN REPAIR INCISIONAL VENTRAL HERNIA;  Surgeon: [name],  MD;  Location: [place];  Service: General;  Laterality: N/A;  . INSERT / REPLACE / REMOVE PACEMAKER     ICD  . INSERTION OF MESH N/A 04/10/2017   Procedure: INSERTION OF MESH;  Surgeon: [name], MD;  Location: [place];  Service: General;  Laterality: N/A;  . LAPAROTOMY N/A 02/22/2016   Procedure: EXPLORATORY LAPAROTOMY WITH  PATCH OF DUODENAL ULCER;  Surgeon: Rolm Bookbinder, MD;  Location: [place];  Service: General;  Laterality: N/A;  . MULTIPLE TOOTH EXTRACTIONS    . TRANSTHORACIC ECHOCARDIOGRAM  12/2010   EF 30-35%  . TRANSURETHRAL RESECTION OF PROSTATE N/A 01/25/2016   Procedure: TRANSURETHRAL RESECTION OF THE PROSTATE (TURP);  Surgeon: Carolan Clines, MD;  [facility];  Service: Urology;  Laterality: N/A;  . WRIST FRACTURE SURGERY Left 1972     Current Outpatient Medications  Medication Sig Dispense Refill  . acetaminophen (TYLENOL) 500 MG tablet Take 500 mg by mouth every 6 (six) hours as needed for mild pain or moderate pain.    Marland Kitchen digoxin (LANOXIN) 0.125 MG tablet Take 1 tablet (0.125 mg total) by mouth daily. (Patient taking differently: Take 0.125 mg by mouth daily. Takes 1/2 tablet every other day) 90 tablet 3  . fenofibrate 160 MG tablet TAKE 1 TABLET BY MOUTH  DAILY (Patient taking differently: Take 160 mg by mouth daily at 2 PM. ) 90 tablet 2  . ferrous sulfate 325 (65 FE) MG tablet Take 325 mg by mouth 2 (two) times daily.    . folic acid (FOLVITE) 808 MCG tablet Take 800 mcg by mouth every evening.     . furosemide (LASIX) 40 MG tablet TAKE 2 TABLETS BY MOUTH IN  THE MORNING AND 1 TABLET IN THE EVENING 270 tablet 3  . losartan (COZAAR) 25 MG tablet TAKE 1 TABLET BY MOUTH  DAILY (Patient taking differently: Take 25 mg by mouth daily. ) 90 tablet 3  . Magnesium 250 MG TABS Take 1 tablet by mouth daily.    . metoprolol succinate (TOPROL-XL) 25 MG 24 hr tablet TAKE 1 TABLET BY MOUTH  DAILY AT 12 NOON. (Patient taking differently: Take 25 mg by mouth daily. ) 90 tablet 2    . Multiple Vitamins-Minerals (MULTIVITAMIN ADULTS PO) Take 1 tablet by mouth daily. Senior    . pantoprazole (PROTONIX) 40 MG tablet TAKE 2 TABLETS BY MOUTH  DAILY (Patient taking differently: Take 40 mg by mouth 2 (two) times daily. ) 180 tablet 3  . potassium chloride SA (KLOR-CON) 20 MEQ tablet TAKE 1 TABLET BY MOUTH  DAILY. TAKE ADDITIONAL  TABLET WHEN TAKING EXTRA  LASIX (FUROSEMIDE) (Patient taking differently: Take 70 mEq by mouth. ) 135 tablet 3  . pravastatin (PRAVACHOL) 40 MG tablet TAKE 1 TABLET BY MOUTH  DAILY 90 tablet 3  . sertraline (ZOLOFT) 100 MG tablet Take 150 mg by mouth daily at 2 PM.     . vitamin C (ASCORBIC ACID) 250 MG tablet Take 250 mg by  mouth 2 (two) times daily.    Marland Kitchen warfarin (COUMADIN) 1 MG tablet Take 3 mg by mouth at bedtime. 2100-2200    . nitroGLYCERIN (NITROSTAT) 0.4 MG SL tablet Place 1 tablet (0.4 mg total) under the tongue every 5 (five) minutes as needed for chest pain. 25 tablet 11   No current facility-administered medications for this visit.    Allergies:   Keflex [cephalexin]    Social History:  The patient  reports that he quit smoking about 21 years ago. His smoking use included cigarettes. He has never used smokeless tobacco. He reports that he does not drink alcohol and does not use drugs.   Family History:  The patient's family history includes Breast cancer in his mother and sister; Depression in his mother; Heart attack in his father.    ROS:  Please see the history of present illness.   Otherwise, review of systems are positive for none.   All other systems are reviewed and negative.    PHYSICAL EXAM: VS:  BP 110/62 (BP Location: Left Arm, Patient Position: Sitting, Cuff Size: Normal)   Pulse 82   Ht 5\' 10"  (1.778 m)   Wt 217 lb 9.6 oz (98.7 kg)   SpO2 99%   BMI 31.22 kg/m  , BMI Body mass index is 31.22 kg/m. GEN: Well nourished, well developed, in no acute distress  HEENT: normal  Neck: no JVD, carotid bruits, or  masses Cardiac: RRR; no murmurs, rubs, or gallops, mild bilateral leg edema Respiratory:  clear to auscultation bilaterally, normal work of breathing GI: soft, nontender, nondistended, + BS MS: no deformity or atrophy  Skin: warm and dry, no rash Neuro:  Strength and sensation are intact Psych: euthymic mood, full affect   EKG:  EKG is not ordered today.    Recent Labs: 04/21/2020: BUN 29; Creatinine, Ser 1.67; Hemoglobin 12.1; Magnesium 2.2; Platelets 270; Potassium 4.0; Sodium 141    Lipid Panel    Component Value Date/Time   CHOL 112 02/27/2016 0500   TRIG 53 02/29/2016 0509   HDL 32 (L) 02/27/2016 0500   CHOLHDL 3.5 02/27/2016 0500   VLDL 14 02/27/2016 0500   LDLCALC 66 02/27/2016 0500   LDLDIRECT 137.6 01/06/2012 1029      Wt Readings from Last 3 Encounters:  06/30/20 217 lb 9.6 oz (98.7 kg)  04/24/20 211 lb (95.7 kg)  04/14/20 220 lb 3.2 oz (99.9 kg)        No flowsheet data found.    ASSESSMENT AND PLAN:  1.  Peripheral arterial disease: The patient was recently found to have an occluded left SFA.  In spite of that, he has no significant left calf claudication.  There is no evidence of critical limb ischemia.  I think this was just an incidental finding and I suspect that the patient had this occlusion for a long time.  His biggest issue seems to be bilateral leg edema and I explained to him that the occluded SFA is not the cause of this.  Given lack of symptoms related peripheral arterial disease, I recommend continuing aggressive medical therapy.  2.  Chronic venous insufficiency: This is well likely culprit for his bilateral leg edema and swelling in addition to his congestive heart failure.  The patient has prominent varicose veins.  I discussed with him the importance of frequent leg elevation during the day.  I encouraged him to continue using knee-high support stockings during the day.  If there is no improvement in his symptoms,  evaluation for venous laser  ablation can be pursued but likely should be left as a last resort considering his comorbidities.  3.  Chronic systolic heart failure: He appears to be euvolemic.  Continue medical therapy.  4.  Hyperlipidemia: Currently on pravastatin 40 mg daily.    Disposition:   FU with me in 6 months  Signed,  Kathlyn Sacramento, MD  06/30/2020 8:59 AM    [place]

## 2020-06-30 NOTE — Telephone Encounter (Signed)
I spoke with [name] and informed that Dr. Jacqualyn Posey had reviewed the results of the dopplers and wanted to make sure that [name] kept his appt with Dr. Fletcher Anon. [name] states they are going out the door now and to thank Dr. Jacqualyn Posey and thanked me for my call.

## 2020-06-30 NOTE — Patient Instructions (Signed)
Medication Instructions:  NO CHANGE *If you need a refill on your cardiac medications before your next appointment, please call your pharmacy*   Lab Work: If you have labs (blood work) drawn today and your tests are completely normal, you will receive your results only by: Marland Kitchen MyChart Message (if you have MyChart) OR . A paper copy in the mail If you have any lab test that is abnormal or we need to change your treatment, we will call you to review the results.  Follow-Up: At [facility], you and your health needs are our priority.  As part of our continuing mission to provide you with exceptional heart care, we have created designated Provider Care Teams.  These Care Teams include your primary Cardiologist (physician) and Advanced Practice Providers (APPs -  Physician Assistants and Nurse Practitioners) who all work together to provide you with the care you need, when you need it.  We recommend signing up for the patient portal called "MyChart".  Sign up information is provided on this After Visit Summary.  MyChart is used to connect with patients for Virtual Visits (Telemedicine).  Patients are able to view lab/test results, encounter notes, upcoming appointments, etc.  Non-urgent messages can be sent to your provider as well.   To learn more about what you can do with MyChart, go to [email or web address].    Your next appointment:   6 month(s)  The format for your next appointment:   In Person  Provider:   You may see DR Fletcher Anon or one of the following Advanced Practice Providers on your designated Care Team:    Kerin Ransom, PA-C  [place], [place]  Coletta Memos, [place]

## 2020-07-13 ENCOUNTER — Ambulatory Visit (INDEPENDENT_AMBULATORY_CARE_PROVIDER_SITE_OTHER): Payer: Medicare Other

## 2020-07-13 DIAGNOSIS — Z95810 Presence of automatic (implantable) cardiac defibrillator: Secondary | ICD-10-CM | POA: Diagnosis not present

## 2020-07-13 DIAGNOSIS — I5022 Chronic systolic (congestive) heart failure: Secondary | ICD-10-CM | POA: Diagnosis not present

## 2020-07-15 NOTE — Progress Notes (Signed)
EPIC Encounter for ICM Monitoring  [name] is a 74 y.o. male Date: 07/15/2020 Primary Care Physican: [name], MD Primary [place] Electrophysiologist: Vergie Living Pacing: 96.4% 7/28/2021Weight:210.8 lbs  Spoke with patient and reports feeling well at this time.  Denies fluid symptoms.    Optivol thoracic impedancenormal.  Prescribed:   Furosemide40 mgTake 2 tablets ( 80 mg ) in am and 1 tablet ( 40 mg ) inPM.  Potassium 20 mEqTAKE 1 TABLET BY MOUTH DAILY. TAKE ADDITIONAL TABLET WHEN TAKING EXTRA LASIX  Labs: 04/21/2020 Creatinine 1.67, BUN 29, Potassium 4.0, Sodium 141, GFR 40-47 A complete set of results can be found in Results Review.  Recommendations:No changes and encouraged to call if experiencing any fluid symptoms.  Follow-up plan: ICM clinic phone appointment on9/13/2021. 91 day device clinic remote transmission8/05/2020.   EP/Cardiology Office Visits: 08/04/2020 with Dr. Caryl Comes.    Copy of ICM check sent to Dr. Caryl Comes.   3 month ICM trend: 07/13/2020    1 Year ICM trend:       [name], RN 07/15/2020 3:03 PM

## 2020-07-24 ENCOUNTER — Ambulatory Visit (INDEPENDENT_AMBULATORY_CARE_PROVIDER_SITE_OTHER): Payer: Medicare Other | Admitting: *Deleted

## 2020-07-24 DIAGNOSIS — I420 Dilated cardiomyopathy: Secondary | ICD-10-CM | POA: Diagnosis not present

## 2020-07-24 LAB — CUP PACEART REMOTE DEVICE CHECK
Battery Remaining Longevity: 95 mo
Battery Voltage: 3.05 V
Brady Statistic AP VP Percent: 0 %
Brady Statistic AP VS Percent: 0 %
Brady Statistic AS VP Percent: 0 %
Brady Statistic AS VS Percent: 0 %
Brady Statistic RA Percent Paced: 0 %
Brady Statistic RV Percent Paced: 95.93 %
Date Time Interrogation Session: 20210806001805
HighPow Impedance: 49 Ohm
HighPow Impedance: 63 Ohm
Implantable Lead Implant Date: 20090226
Implantable Lead Implant Date: 20090226
Implantable Lead Implant Date: 20090226
Implantable Lead Location: 753858
Implantable Lead Location: 753859
Implantable Lead Location: 753860
Implantable Lead Model: 4194
Implantable Lead Model: 5076
Implantable Lead Model: 6947
Implantable Pulse Generator Implant Date: 20210507
Lead Channel Impedance Value: 247 Ohm
Lead Channel Impedance Value: 285 Ohm
Lead Channel Impedance Value: 361 Ohm
Lead Channel Impedance Value: 513 Ohm
Lead Channel Impedance Value: 589 Ohm
Lead Channel Impedance Value: 760 Ohm
Lead Channel Pacing Threshold Amplitude: 0.625 V
Lead Channel Pacing Threshold Amplitude: 1.125 V
Lead Channel Pacing Threshold Pulse Width: 0.4 ms
Lead Channel Pacing Threshold Pulse Width: 0.6 ms
Lead Channel Setting Pacing Amplitude: 1.25 V
Lead Channel Setting Pacing Amplitude: 2.5 V
Lead Channel Setting Pacing Pulse Width: 0.4 ms
Lead Channel Setting Pacing Pulse Width: 0.6 ms
Lead Channel Setting Sensing Sensitivity: 0.3 mV

## 2020-07-27 NOTE — Progress Notes (Signed)
Remote ICD transmission.   

## 2020-08-04 ENCOUNTER — Other Ambulatory Visit: Payer: Self-pay

## 2020-08-04 ENCOUNTER — Ambulatory Visit: Payer: Medicare Other | Admitting: Internal Medicine

## 2020-08-04 ENCOUNTER — Encounter: Payer: Self-pay | Admitting: Internal Medicine

## 2020-08-04 VITALS — BP 138/56 | HR 83 | Ht 70.0 in | Wt 213.0 lb

## 2020-08-04 DIAGNOSIS — I255 Ischemic cardiomyopathy: Secondary | ICD-10-CM

## 2020-08-04 DIAGNOSIS — Z95810 Presence of automatic (implantable) cardiac defibrillator: Secondary | ICD-10-CM | POA: Diagnosis not present

## 2020-08-04 DIAGNOSIS — I5022 Chronic systolic (congestive) heart failure: Secondary | ICD-10-CM

## 2020-08-04 DIAGNOSIS — I4821 Permanent atrial fibrillation: Secondary | ICD-10-CM

## 2020-08-04 NOTE — Progress Notes (Signed)
Patient Care Team: [name], MD as PCP - General (Family Medicine) Martinique, Peter M, MD as PCP - Cardiology (Cardiology) [name], MD as PCP - Electrophysiology (Cardiology) [name], MD as Consulting Physician (Cardiology)   HPI  [name] is a 74 y.o. male seen in follow-up for ischemic cardiomyopathy with HFrEF and prior CRT-D implantation.  He also has afib for which he saw Dr Greggory Brandy and the was decision to not pursue P VI  We had spent many months deciding what to do about rate control and finally underwent AV junction ablation 2/18  Underwent generator replacement 3/21  Stable sob, chronic L> R edema  No chest pain   DATE TEST EF   1/17 Echo  20-25 %   10/18 Echo  25-30%           Date Cr K Dig Hgb  4/18 1.43 3.4    11/18 1.42 4.6    12/19 1.79 3.7 (0.4) 12  7/20 1.39 3.7  12.0  5/21 1.67 4.0  12.4   He has moved to [place]. And loves it    Past Medical History:  Diagnosis Date  . AICD (automatic cardioverter/defibrillator) present   . Anemia   . Anxiety   . CHF (congestive heart failure) ([place])   . Chronic renal insufficiency   . Coronary artery disease   . Depression   . Dyslipidemia   . Dysrhythmia    atrial fibrillation  . ED (erectile dysfunction)   . GERD (gastroesophageal reflux disease)   . Incisional hernia, without obstruction or gangrene 04/10/2017  . Ischemic cardiomyopathy    EF 23%  . MI (myocardial infarction) ([place]) 2000   ANTERIOR, s/p PCI  . Other primary cardiomyopathies   . Peripheral vascular disease ([place])    dvt legs 2017  . Persistent atrial fibrillation (HCC)    on coumadin  . Presence of permanent cardiac pacemaker   . Shortness of breath dyspnea    on exertion    Past Surgical History:  Procedure Laterality Date  . AV NODE ABLATION N/A 01/25/2017   Procedure: AV Node Ablation;  Surgeon: [name], MD;  Location: [place] [facility];  Service: Cardiovascular;  Laterality: N/A;  . AV  NODE ABLATION  01/25/2017  . BIV ICD GENERATOR CHANGEOUT N/A 04/24/2020   Procedure: BIV ICD GENERATOR CHANGEOUT;  Surgeon: [name], MD;  Location: [place] CV LAB;  Service: Cardiovascular;  Laterality: N/A;  . BIV ICD GENERTAOR CHANGE OUT N/A 09/16/2013   Procedure: BIV ICD GENERTAOR CHANGE OUT;  Surgeon: [name], MD;  Location: [facility] CATH LAB;  Service: Cardiovascular;  Laterality: N/A;  . BREAST LUMPECTOMY Left   . CARDIAC CATHETERIZATION  02/05/2008   MODERATE TO SEVERE LEFT VENTRICULAR  DYSFUNCTION WITH EF 25-30%  . CARDIAC DEFIBRILLATOR PLACEMENT  2009   MDT BI-VENTRICULAR ICD by Dr Caryl Comes  . COLONOSCOPY WITH PROPOFOL N/A 08/31/2016   Procedure: COLONOSCOPY WITH PROPOFOL;  Surgeon: Wilford Corner, MD;  Location: [facility] ENDOSCOPY;  Service: Endoscopy;  Laterality: N/A;  . CORONARY ANGIOPLASTY  2000   stent  . CORONARY STENT PLACEMENT     LAD  . CYSTOSCOPY N/A 01/25/2016   Procedure: CYSTOSCOPY;  Surgeon: Carolan Clines, MD;  [facility];  Service: Urology;  Laterality: N/A;  . ESOPHAGOGASTRODUODENOSCOPY (EGD) WITH PROPOFOL N/A 08/31/2016   Procedure: ESOPHAGOGASTRODUODENOSCOPY (EGD) WITH PROPOFOL;  Surgeon: Wilford Corner, MD;  Location: [facility] ENDOSCOPY;  Service: Endoscopy;  Laterality: N/A;  . FRACTURE SURGERY Right 1972   leg  . IMPLANTABLE CARDIOVERTER DEFIBRILLATOR GENERATOR CHANGE  2014  . INCISIONAL HERNIA REPAIR N/A 04/10/2017   Procedure: OPEN REPAIR INCISIONAL VENTRAL HERNIA;  Surgeon: [name], MD;  Location: [place];  Service: General;  Laterality: N/A;  . INSERT / REPLACE / REMOVE PACEMAKER     ICD  . INSERTION OF MESH N/A 04/10/2017   Procedure: INSERTION OF MESH;  Surgeon: [name], MD;  Location: [place];  Service: General;  Laterality: N/A;  . LAPAROTOMY N/A 02/22/2016   Procedure: EXPLORATORY LAPAROTOMY WITH  PATCH OF DUODENAL ULCER;  Surgeon: Rolm Bookbinder, MD;  Location: [place];  Service: General;  Laterality: N/A;  . MULTIPLE TOOTH  EXTRACTIONS    . TRANSTHORACIC ECHOCARDIOGRAM  12/2010   EF 30-35%  . TRANSURETHRAL RESECTION OF PROSTATE N/A 01/25/2016   Procedure: TRANSURETHRAL RESECTION OF THE PROSTATE (TURP);  Surgeon: Carolan Clines, MD;  [facility];  Service: Urology;  Laterality: N/A;  . WRIST FRACTURE SURGERY Left 1972    Current Outpatient Medications  Medication Sig Dispense Refill  . acetaminophen (TYLENOL) 500 MG tablet Take 500 mg by mouth every 6 (six) hours as needed for mild pain or moderate pain.    Marland Kitchen digoxin (LANOXIN) 0.125 MG tablet Take 1 tablet (0.125 mg total) by mouth daily. (Patient taking differently: Take 0.125 mg by mouth daily. Takes 1/2 tablet every other day) 90 tablet 3  . fenofibrate 160 MG tablet TAKE 1 TABLET BY MOUTH  DAILY (Patient taking differently: Take 160 mg by mouth daily at 2 PM. ) 90 tablet 2  . ferrous sulfate 325 (65 FE) MG tablet Take 325 mg by mouth 2 (two) times daily.    . folic acid (FOLVITE) 951 MCG tablet Take 800 mcg by mouth every evening.     . furosemide (LASIX) 40 MG tablet TAKE 2 TABLETS BY MOUTH IN  THE MORNING AND 1 TABLET IN THE EVENING 270 tablet 3  . losartan (COZAAR) 25 MG tablet TAKE 1 TABLET BY MOUTH  DAILY (Patient taking differently: Take 25 mg by mouth daily. ) 90 tablet 3  . Magnesium 250 MG TABS Take 500 mg by mouth daily.    . metoprolol succinate (TOPROL-XL) 25 MG 24 hr tablet TAKE 1 TABLET BY MOUTH  DAILY AT 12 NOON. (Patient taking differently: Take 25 mg by mouth daily. ) 90 tablet 2  . Multiple Vitamins-Minerals (MULTIVITAMIN ADULTS PO) Take 1 tablet by mouth daily. Senior    . pantoprazole (PROTONIX) 40 MG tablet TAKE 2 TABLETS BY MOUTH  DAILY (Patient taking differently: Take 40 mg by mouth 2 (two) times daily. ) 180 tablet 3  . potassium chloride SA (KLOR-CON) 20 MEQ tablet TAKE 1 TABLET BY MOUTH  DAILY. TAKE ADDITIONAL  TABLET WHEN TAKING EXTRA  LASIX (FUROSEMIDE) (Patient taking differently: Take 70 mEq by mouth. ) 135 tablet 3  .  pravastatin (PRAVACHOL) 40 MG tablet TAKE 1 TABLET BY MOUTH  DAILY 90 tablet 3  . sertraline (ZOLOFT) 100 MG tablet Take 150 mg by mouth daily at 2 PM.     . vitamin C (ASCORBIC ACID) 250 MG tablet Take 250 mg by mouth 2 (two) times daily.    Marland Kitchen warfarin (COUMADIN) 1 MG tablet Take 3 mg by mouth at bedtime. 2100-2200    . nitroGLYCERIN (NITROSTAT) 0.4 MG SL tablet Place 1 tablet (0.4 mg total) under the tongue every 5 (five) minutes as needed for chest pain.  25 tablet 11   No current facility-administered medications for this visit.    Allergies  Allergen Reactions  . Keflex [Cephalexin] Shortness Of Breath and Other (See Comments)    dizziness    Review of Systems negative except from HPI and PMH  Physical Exam BP (!) 138/56   Pulse 83   Ht 5\' 10"  (1.778 m)   Wt 213 lb (96.6 kg)   SpO2 98%   BMI 30.56 kg/m  Well developed and well nourished in no acute distress HENT normal Neck supple with JVP-flat Clear Device pocket well healed; without hematoma or erythema.  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     Device generator replacement without evidence of infection  Chronic lower extremity edema. We have discussed the physiology of heart failure including the importance of salt restriction and fluid restriction and have reviewed sources of dietary salt and water.   Without symptoms of ischemia

## 2020-08-04 NOTE — Patient Instructions (Signed)
Medication Instructions:  Your physician recommends that you continue on your current medications as directed. Please refer to the Current Medication list given to you today.  *If you need a refill on your cardiac medications before your next appointment, please call your pharmacy*   Lab Work: None ordered.  If you have labs (blood work) drawn today and your tests are completely normal, you will receive your results only by: . MyChart Message (if you have MyChart) OR . A paper copy in the mail If you have any lab test that is abnormal or we need to change your treatment, we will call you to review the results.   Testing/Procedures: None ordered.    Follow-Up: At [facility], you and your health needs are our priority.  As part of our continuing mission to provide you with exceptional heart care, we have created designated Provider Care Teams.  These Care Teams include your primary Cardiologist (physician) and Advanced Practice Providers (APPs -  Physician Assistants and Nurse Practitioners) who all work together to provide you with the care you need, when you need it.  We recommend signing up for the patient portal called "MyChart".  Sign up information is provided on this After Visit Summary.  MyChart is used to connect with patients for Virtual Visits (Telemedicine).  Patients are able to view lab/test results, encounter notes, upcoming appointments, etc.  Non-urgent messages can be sent to your provider as well.   To learn more about what you can do with MyChart, go to [email or web address].    Your next appointment:   9 months  The format for your next appointment:   In Person  Provider:   Steven Klein, MD     

## 2020-08-05 LAB — CUP PACEART INCLINIC DEVICE CHECK
Battery Remaining Longevity: 94 mo
Battery Voltage: 3.05 V
Brady Statistic AP VP Percent: 0 %
Brady Statistic AP VS Percent: 0 %
Brady Statistic AS VP Percent: 0 %
Brady Statistic AS VS Percent: 0 %
Brady Statistic RA Percent Paced: 0 %
Brady Statistic RV Percent Paced: 95.93 %
Date Time Interrogation Session: 20210817135700
HighPow Impedance: 50 Ohm
HighPow Impedance: 65 Ohm
Implantable Lead Implant Date: 20090226
Implantable Lead Implant Date: 20090226
Implantable Lead Implant Date: 20090226
Implantable Lead Location: 753858
Implantable Lead Location: 753859
Implantable Lead Location: 753860
Implantable Lead Model: 4194
Implantable Lead Model: 5076
Implantable Lead Model: 6947
Implantable Pulse Generator Implant Date: 20210507
Lead Channel Impedance Value: 228 Ohm
Lead Channel Impedance Value: 285 Ohm
Lead Channel Impedance Value: 361 Ohm
Lead Channel Impedance Value: 513 Ohm
Lead Channel Impedance Value: 589 Ohm
Lead Channel Impedance Value: 722 Ohm
Lead Channel Pacing Threshold Amplitude: 0.75 V
Lead Channel Pacing Threshold Amplitude: 1 V
Lead Channel Pacing Threshold Pulse Width: 0.4 ms
Lead Channel Pacing Threshold Pulse Width: 0.6 ms
Lead Channel Sensing Intrinsic Amplitude: 2.75 mV
Lead Channel Setting Pacing Amplitude: 1.25 V
Lead Channel Setting Pacing Amplitude: 2.5 V
Lead Channel Setting Pacing Pulse Width: 0.4 ms
Lead Channel Setting Pacing Pulse Width: 0.6 ms
Lead Channel Setting Sensing Sensitivity: 0.3 mV

## 2020-08-25 ENCOUNTER — Emergency Department (HOSPITAL_COMMUNITY): Payer: Medicare Other

## 2020-08-25 ENCOUNTER — Encounter (HOSPITAL_COMMUNITY): Payer: Self-pay

## 2020-08-25 ENCOUNTER — Inpatient Hospital Stay (HOSPITAL_COMMUNITY)
Admission: EM | Admit: 2020-08-25 | Discharge: 2020-09-28 | DRG: 177 | Disposition: A | Discharge status: Home Health | Payer: Medicare Other | Attending: Internal Medicine | Admitting: Internal Medicine

## 2020-08-25 ENCOUNTER — Other Ambulatory Visit: Payer: Self-pay

## 2020-08-25 DIAGNOSIS — Z8249 Family history of ischemic heart disease and other diseases of the circulatory system: Secondary | ICD-10-CM

## 2020-08-25 DIAGNOSIS — I5022 Chronic systolic (congestive) heart failure: Secondary | ICD-10-CM | POA: Diagnosis present

## 2020-08-25 DIAGNOSIS — N179 Acute kidney failure, unspecified: Secondary | ICD-10-CM | POA: Diagnosis not present

## 2020-08-25 DIAGNOSIS — F419 Anxiety disorder, unspecified: Secondary | ICD-10-CM | POA: Diagnosis present

## 2020-08-25 DIAGNOSIS — K219 Gastro-esophageal reflux disease without esophagitis: Secondary | ICD-10-CM | POA: Diagnosis present

## 2020-08-25 DIAGNOSIS — N1831 Chronic kidney disease, stage 3a: Secondary | ICD-10-CM | POA: Diagnosis present

## 2020-08-25 DIAGNOSIS — Z881 Allergy status to other antibiotic agents status: Secondary | ICD-10-CM

## 2020-08-25 DIAGNOSIS — R0602 Shortness of breath: Secondary | ICD-10-CM

## 2020-08-25 DIAGNOSIS — R7982 Elevated C-reactive protein (CRP): Secondary | ICD-10-CM | POA: Diagnosis present

## 2020-08-25 DIAGNOSIS — B370 Candidal stomatitis: Secondary | ICD-10-CM | POA: Diagnosis not present

## 2020-08-25 DIAGNOSIS — U071 COVID-19: Secondary | ICD-10-CM | POA: Diagnosis not present

## 2020-08-25 DIAGNOSIS — Z955 Presence of coronary angioplasty implant and graft: Secondary | ICD-10-CM

## 2020-08-25 DIAGNOSIS — Z803 Family history of malignant neoplasm of breast: Secondary | ICD-10-CM

## 2020-08-25 DIAGNOSIS — E876 Hypokalemia: Secondary | ICD-10-CM | POA: Diagnosis not present

## 2020-08-25 DIAGNOSIS — I272 Pulmonary hypertension, unspecified: Secondary | ICD-10-CM | POA: Diagnosis present

## 2020-08-25 DIAGNOSIS — I82453 Acute embolism and thrombosis of peroneal vein, bilateral: Secondary | ICD-10-CM | POA: Diagnosis present

## 2020-08-25 DIAGNOSIS — E785 Hyperlipidemia, unspecified: Secondary | ICD-10-CM | POA: Diagnosis present

## 2020-08-25 DIAGNOSIS — Z95810 Presence of automatic (implantable) cardiac defibrillator: Secondary | ICD-10-CM

## 2020-08-25 DIAGNOSIS — R791 Abnormal coagulation profile: Secondary | ICD-10-CM | POA: Diagnosis present

## 2020-08-25 DIAGNOSIS — Z79899 Other long term (current) drug therapy: Secondary | ICD-10-CM

## 2020-08-25 DIAGNOSIS — F05 Delirium due to known physiological condition: Secondary | ICD-10-CM | POA: Diagnosis not present

## 2020-08-25 DIAGNOSIS — T380X5A Adverse effect of glucocorticoids and synthetic analogues, initial encounter: Secondary | ICD-10-CM | POA: Diagnosis not present

## 2020-08-25 DIAGNOSIS — I5043 Acute on chronic combined systolic (congestive) and diastolic (congestive) heart failure: Secondary | ICD-10-CM | POA: Diagnosis present

## 2020-08-25 DIAGNOSIS — I083 Combined rheumatic disorders of mitral, aortic and tricuspid valves: Secondary | ICD-10-CM | POA: Diagnosis present

## 2020-08-25 DIAGNOSIS — I428 Other cardiomyopathies: Secondary | ICD-10-CM

## 2020-08-25 DIAGNOSIS — I130 Hypertensive heart and chronic kidney disease with heart failure and stage 1 through stage 4 chronic kidney disease, or unspecified chronic kidney disease: Secondary | ICD-10-CM | POA: Diagnosis present

## 2020-08-25 DIAGNOSIS — I4891 Unspecified atrial fibrillation: Secondary | ICD-10-CM | POA: Diagnosis present

## 2020-08-25 DIAGNOSIS — I2510 Atherosclerotic heart disease of native coronary artery without angina pectoris: Secondary | ICD-10-CM

## 2020-08-25 DIAGNOSIS — I4821 Permanent atrial fibrillation: Secondary | ICD-10-CM | POA: Diagnosis present

## 2020-08-25 DIAGNOSIS — G4730 Sleep apnea, unspecified: Secondary | ICD-10-CM | POA: Diagnosis present

## 2020-08-25 DIAGNOSIS — I252 Old myocardial infarction: Secondary | ICD-10-CM

## 2020-08-25 DIAGNOSIS — J1282 Pneumonia due to coronavirus disease 2019: Secondary | ICD-10-CM

## 2020-08-25 DIAGNOSIS — N183 Chronic kidney disease, stage 3 unspecified: Secondary | ICD-10-CM | POA: Diagnosis present

## 2020-08-25 DIAGNOSIS — M549 Dorsalgia, unspecified: Secondary | ICD-10-CM | POA: Diagnosis present

## 2020-08-25 DIAGNOSIS — R0600 Dyspnea, unspecified: Secondary | ICD-10-CM

## 2020-08-25 DIAGNOSIS — K645 Perianal venous thrombosis: Secondary | ICD-10-CM | POA: Diagnosis present

## 2020-08-25 DIAGNOSIS — J9601 Acute respiratory failure with hypoxia: Secondary | ICD-10-CM | POA: Diagnosis present

## 2020-08-25 DIAGNOSIS — G9341 Metabolic encephalopathy: Secondary | ICD-10-CM | POA: Diagnosis not present

## 2020-08-25 DIAGNOSIS — Y92239 Unspecified place in hospital as the place of occurrence of the external cause: Secondary | ICD-10-CM | POA: Diagnosis not present

## 2020-08-25 DIAGNOSIS — I5042 Chronic combined systolic (congestive) and diastolic (congestive) heart failure: Secondary | ICD-10-CM

## 2020-08-25 DIAGNOSIS — E860 Dehydration: Secondary | ICD-10-CM | POA: Diagnosis not present

## 2020-08-25 DIAGNOSIS — F329 Major depressive disorder, single episode, unspecified: Secondary | ICD-10-CM | POA: Diagnosis present

## 2020-08-25 DIAGNOSIS — Z7901 Long term (current) use of anticoagulants: Secondary | ICD-10-CM

## 2020-08-25 DIAGNOSIS — Z818 Family history of other mental and behavioral disorders: Secondary | ICD-10-CM

## 2020-08-25 DIAGNOSIS — Z9114 Patient's other noncompliance with medication regimen: Secondary | ICD-10-CM

## 2020-08-25 DIAGNOSIS — I10 Essential (primary) hypertension: Secondary | ICD-10-CM | POA: Diagnosis present

## 2020-08-25 DIAGNOSIS — Z87891 Personal history of nicotine dependence: Secondary | ICD-10-CM

## 2020-08-25 LAB — BASIC METABOLIC PANEL
Anion gap: 11 (ref 5–15)
BUN: 31 mg/dL — ABNORMAL HIGH (ref 8–23)
CO2: 24 mmol/L (ref 22–32)
Calcium: 9.9 mg/dL (ref 8.9–10.3)
Chloride: 107 mmol/L (ref 98–111)
Creatinine, Ser: 1.54 mg/dL — ABNORMAL HIGH (ref 0.61–1.24)
GFR calc Af Amer: 51 mL/min — ABNORMAL LOW (ref >60)
GFR calc non Af Amer: 44 mL/min — ABNORMAL LOW (ref >60)
Glucose, Bld: 109 mg/dL — ABNORMAL HIGH (ref 70–99)
Potassium: 4.1 mmol/L (ref 3.5–5.1)
Sodium: 142 mmol/L (ref 135–145)

## 2020-08-25 LAB — CBC
HCT: 35 % — ABNORMAL LOW (ref 39.0–52.0)
Hemoglobin: 10.7 g/dL — ABNORMAL LOW (ref 13.0–17.0)
MCH: 25.4 pg — ABNORMAL LOW (ref 26.0–34.0)
MCHC: 30.6 g/dL (ref 30.0–36.0)
MCV: 83.1 fL (ref 80.0–100.0)
Platelets: 332 10*3/uL (ref 150–400)
RBC: 4.21 MIL/uL — ABNORMAL LOW (ref 4.22–5.81)
RDW: 15.6 % — ABNORMAL HIGH (ref 11.5–15.5)
WBC: 7.9 10*3/uL (ref 4.0–10.5)
nRBC: 0 % (ref 0.0–0.2)

## 2020-08-25 LAB — BRAIN NATRIURETIC PEPTIDE: B Natriuretic Peptide: 429 pg/mL — ABNORMAL HIGH (ref 0.0–100.0)

## 2020-08-25 NOTE — ED Triage Notes (Signed)
Pt reports sob for the past 1.5 weeks, hx of CHF but denies any recent weight gain. Pt denies chest pain. Pt has been fully vaccinated for COVID (pfizer in may). Pt a.o resp e.u

## 2020-08-26 ENCOUNTER — Other Ambulatory Visit: Payer: Self-pay

## 2020-08-26 DIAGNOSIS — I10 Essential (primary) hypertension: Secondary | ICD-10-CM | POA: Diagnosis not present

## 2020-08-26 DIAGNOSIS — G9341 Metabolic encephalopathy: Secondary | ICD-10-CM | POA: Diagnosis not present

## 2020-08-26 DIAGNOSIS — J9601 Acute respiratory failure with hypoxia: Secondary | ICD-10-CM | POA: Diagnosis present

## 2020-08-26 DIAGNOSIS — J1282 Pneumonia due to coronavirus disease 2019: Secondary | ICD-10-CM | POA: Diagnosis present

## 2020-08-26 DIAGNOSIS — I82453 Acute embolism and thrombosis of peroneal vein, bilateral: Secondary | ICD-10-CM | POA: Diagnosis present

## 2020-08-26 DIAGNOSIS — K219 Gastro-esophageal reflux disease without esophagitis: Secondary | ICD-10-CM | POA: Diagnosis present

## 2020-08-26 DIAGNOSIS — I252 Old myocardial infarction: Secondary | ICD-10-CM | POA: Diagnosis not present

## 2020-08-26 DIAGNOSIS — N1831 Chronic kidney disease, stage 3a: Secondary | ICD-10-CM | POA: Diagnosis not present

## 2020-08-26 DIAGNOSIS — I5043 Acute on chronic combined systolic (congestive) and diastolic (congestive) heart failure: Secondary | ICD-10-CM | POA: Diagnosis present

## 2020-08-26 DIAGNOSIS — B370 Candidal stomatitis: Secondary | ICD-10-CM | POA: Diagnosis not present

## 2020-08-26 DIAGNOSIS — I130 Hypertensive heart and chronic kidney disease with heart failure and stage 1 through stage 4 chronic kidney disease, or unspecified chronic kidney disease: Secondary | ICD-10-CM | POA: Diagnosis present

## 2020-08-26 DIAGNOSIS — Z95810 Presence of automatic (implantable) cardiac defibrillator: Secondary | ICD-10-CM | POA: Diagnosis not present

## 2020-08-26 DIAGNOSIS — Y92239 Unspecified place in hospital as the place of occurrence of the external cause: Secondary | ICD-10-CM | POA: Diagnosis not present

## 2020-08-26 DIAGNOSIS — I2510 Atherosclerotic heart disease of native coronary artery without angina pectoris: Secondary | ICD-10-CM | POA: Diagnosis present

## 2020-08-26 DIAGNOSIS — U071 COVID-19: Secondary | ICD-10-CM | POA: Diagnosis present

## 2020-08-26 DIAGNOSIS — F05 Delirium due to known physiological condition: Secondary | ICD-10-CM | POA: Diagnosis not present

## 2020-08-26 DIAGNOSIS — I4821 Permanent atrial fibrillation: Secondary | ICD-10-CM | POA: Diagnosis present

## 2020-08-26 DIAGNOSIS — I5042 Chronic combined systolic (congestive) and diastolic (congestive) heart failure: Secondary | ICD-10-CM | POA: Diagnosis present

## 2020-08-26 DIAGNOSIS — N179 Acute kidney failure, unspecified: Secondary | ICD-10-CM | POA: Diagnosis not present

## 2020-08-26 DIAGNOSIS — R7989 Other specified abnormal findings of blood chemistry: Secondary | ICD-10-CM | POA: Diagnosis not present

## 2020-08-26 DIAGNOSIS — E785 Hyperlipidemia, unspecified: Secondary | ICD-10-CM | POA: Diagnosis present

## 2020-08-26 DIAGNOSIS — Z7901 Long term (current) use of anticoagulants: Secondary | ICD-10-CM | POA: Diagnosis not present

## 2020-08-26 DIAGNOSIS — I428 Other cardiomyopathies: Secondary | ICD-10-CM | POA: Diagnosis present

## 2020-08-26 DIAGNOSIS — Z87891 Personal history of nicotine dependence: Secondary | ICD-10-CM | POA: Diagnosis not present

## 2020-08-26 DIAGNOSIS — I5023 Acute on chronic systolic (congestive) heart failure: Secondary | ICD-10-CM | POA: Diagnosis not present

## 2020-08-26 DIAGNOSIS — Z803 Family history of malignant neoplasm of breast: Secondary | ICD-10-CM | POA: Diagnosis not present

## 2020-08-26 DIAGNOSIS — Z881 Allergy status to other antibiotic agents status: Secondary | ICD-10-CM | POA: Diagnosis not present

## 2020-08-26 DIAGNOSIS — Z79899 Other long term (current) drug therapy: Secondary | ICD-10-CM | POA: Diagnosis not present

## 2020-08-26 DIAGNOSIS — Z8249 Family history of ischemic heart disease and other diseases of the circulatory system: Secondary | ICD-10-CM | POA: Diagnosis not present

## 2020-08-26 DIAGNOSIS — Z818 Family history of other mental and behavioral disorders: Secondary | ICD-10-CM | POA: Diagnosis not present

## 2020-08-26 LAB — COMPREHENSIVE METABOLIC PANEL
ALT: 23 U/L (ref 0–44)
AST: 37 U/L (ref 15–41)
Albumin: 2.8 g/dL — ABNORMAL LOW (ref 3.5–5.0)
Alkaline Phosphatase: 42 U/L (ref 38–126)
Anion gap: 14 (ref 5–15)
BUN: 26 mg/dL — ABNORMAL HIGH (ref 8–23)
CO2: 23 mmol/L (ref 22–32)
Calcium: 9.9 mg/dL (ref 8.9–10.3)
Chloride: 105 mmol/L (ref 98–111)
Creatinine, Ser: 1.4 mg/dL — ABNORMAL HIGH (ref 0.61–1.24)
GFR calc Af Amer: 58 mL/min — ABNORMAL LOW (ref >60)
GFR calc non Af Amer: 50 mL/min — ABNORMAL LOW (ref >60)
Glucose, Bld: 95 mg/dL (ref 70–99)
Potassium: 4.3 mmol/L (ref 3.5–5.1)
Sodium: 142 mmol/L (ref 135–145)
Total Bilirubin: 1 mg/dL (ref 0.3–1.2)
Total Protein: 6.9 g/dL (ref 6.5–8.1)

## 2020-08-26 LAB — SARS CORONAVIRUS 2 BY RT PCR (HOSPITAL ORDER, PERFORMED IN ~~LOC~~ HOSPITAL LAB): SARS Coronavirus 2: POSITIVE — AB

## 2020-08-26 LAB — TRIGLYCERIDES: Triglycerides: 139 mg/dL (ref <150)

## 2020-08-26 LAB — FIBRINOGEN: Fibrinogen: 780 mg/dL — ABNORMAL HIGH (ref 210–475)

## 2020-08-26 LAB — LACTIC ACID, PLASMA: Lactic Acid, Venous: 1.4 mmol/L (ref 0.5–1.9)

## 2020-08-26 LAB — LACTATE DEHYDROGENASE: LDH: 278 U/L — ABNORMAL HIGH (ref 98–192)

## 2020-08-26 LAB — FERRITIN: Ferritin: 1798 ng/mL — ABNORMAL HIGH (ref 24–336)

## 2020-08-26 LAB — D-DIMER, QUANTITATIVE: D-Dimer, Quant: 1.67 ug/mL-FEU — ABNORMAL HIGH (ref 0.00–0.50)

## 2020-08-26 LAB — PROTIME-INR
INR: 2.5 — ABNORMAL HIGH (ref 0.8–1.2)
Prothrombin Time: 26.3 seconds — ABNORMAL HIGH (ref 11.4–15.2)

## 2020-08-26 LAB — PROCALCITONIN: Procalcitonin: 0.12 ng/mL

## 2020-08-26 LAB — TROPONIN I (HIGH SENSITIVITY): Troponin I (High Sensitivity): 25 ng/L — ABNORMAL HIGH (ref <18)

## 2020-08-26 LAB — C-REACTIVE PROTEIN: CRP: 11.3 mg/dL — ABNORMAL HIGH (ref <1.0)

## 2020-08-26 MED ORDER — HYDROCOD POLST-CPM POLST ER 10-8 MG/5ML PO SUER
5.0000 mL | Freq: Two times a day (BID) | ORAL | Status: DC | PRN
  Administered 2020-08-29 – 2020-09-26 (×16): 5 mL via ORAL
  Filled 2020-08-26 (×16): qty 5

## 2020-08-26 MED ORDER — FUROSEMIDE 40 MG PO TABS
40.0000 mg | ORAL_TABLET | ORAL | Status: DC
  Administered 2020-08-26 – 2020-08-30 (×5): 40 mg via ORAL
  Filled 2020-08-26: qty 1
  Filled 2020-08-26: qty 2
  Filled 2020-08-26 (×2): qty 1
  Filled 2020-08-26: qty 2

## 2020-08-26 MED ORDER — ASCORBIC ACID 500 MG PO TABS
500.0000 mg | ORAL_TABLET | Freq: Every day | ORAL | Status: DC
  Administered 2020-08-26 – 2020-09-28 (×34): 500 mg via ORAL
  Filled 2020-08-26 (×34): qty 1

## 2020-08-26 MED ORDER — SERTRALINE HCL 50 MG PO TABS
150.0000 mg | ORAL_TABLET | Freq: Every day | ORAL | Status: DC
  Administered 2020-08-26 – 2020-09-28 (×34): 150 mg via ORAL
  Filled 2020-08-26 (×36): qty 1

## 2020-08-26 MED ORDER — NITROGLYCERIN 0.4 MG SL SUBL: 0.4000 mg | SUBLINGUAL_TABLET | SUBLINGUAL | Status: DC | PRN

## 2020-08-26 MED ORDER — SODIUM CHLORIDE 0.9 % IV SOLN
100.0000 mg | Freq: Every day | INTRAVENOUS | Status: AC
  Administered 2020-08-27 – 2020-08-30 (×4): 100 mg via INTRAVENOUS
  Filled 2020-08-26: qty 100
  Filled 2020-08-26 (×3): qty 20
  Filled 2020-08-26 (×2): qty 100

## 2020-08-26 MED ORDER — ONDANSETRON HCL 4 MG PO TABS: 4.0000 mg | ORAL_TABLET | Freq: Four times a day (QID) | ORAL | Status: DC | PRN

## 2020-08-26 MED ORDER — WARFARIN - PHARMACIST DOSING INPATIENT: Freq: Every day | Status: DC

## 2020-08-26 MED ORDER — DIGOXIN 125 MCG PO TABS
0.1250 mg | ORAL_TABLET | Freq: Every day | ORAL | Status: DC
  Administered 2020-08-26 – 2020-09-28 (×34): 0.125 mg via ORAL
  Filled 2020-08-26 (×34): qty 1

## 2020-08-26 MED ORDER — SODIUM CHLORIDE 0.9 % IV SOLN
200.0000 mg | Freq: Once | INTRAVENOUS | Status: AC
  Administered 2020-08-26: 200 mg via INTRAVENOUS
  Filled 2020-08-26: qty 40

## 2020-08-26 MED ORDER — PRAVASTATIN SODIUM 40 MG PO TABS
40.0000 mg | ORAL_TABLET | Freq: Every day | ORAL | Status: DC
  Administered 2020-08-26 – 2020-09-28 (×34): 40 mg via ORAL
  Filled 2020-08-26 (×34): qty 1

## 2020-08-26 MED ORDER — ALBUTEROL SULFATE HFA 108 (90 BASE) MCG/ACT IN AERS
2.0000 | INHALATION_SPRAY | Freq: Four times a day (QID) | RESPIRATORY_TRACT | Status: DC
  Administered 2020-08-26 – 2020-09-01 (×24): 2 via RESPIRATORY_TRACT
  Filled 2020-08-26 (×2): qty 6.7

## 2020-08-26 MED ORDER — WARFARIN SODIUM 3 MG PO TABS
3.0000 mg | ORAL_TABLET | Freq: Once | ORAL | Status: AC
  Administered 2020-08-26: 3 mg via ORAL
  Filled 2020-08-26: qty 1

## 2020-08-26 MED ORDER — METOPROLOL SUCCINATE ER 25 MG PO TB24
25.0000 mg | ORAL_TABLET | Freq: Every day | ORAL | Status: DC
  Administered 2020-08-26 – 2020-09-25 (×26): 25 mg via ORAL
  Filled 2020-08-26 (×32): qty 1

## 2020-08-26 MED ORDER — GUAIFENESIN-DM 100-10 MG/5ML PO SYRP
10.0000 mL | ORAL_SOLUTION | ORAL | Status: DC | PRN
  Administered 2020-08-27 – 2020-08-29 (×2): 10 mL via ORAL
  Filled 2020-08-26 (×2): qty 10

## 2020-08-26 MED ORDER — ONDANSETRON HCL 4 MG/2ML IJ SOLN: 4.0000 mg | Freq: Four times a day (QID) | INTRAMUSCULAR | Status: DC | PRN

## 2020-08-26 MED ORDER — FENOFIBRATE 160 MG PO TABS
160.0000 mg | ORAL_TABLET | Freq: Every day | ORAL | Status: DC
  Administered 2020-08-26 – 2020-09-28 (×34): 160 mg via ORAL
  Filled 2020-08-26 (×37): qty 1

## 2020-08-26 MED ORDER — PANTOPRAZOLE SODIUM 40 MG PO TBEC
40.0000 mg | DELAYED_RELEASE_TABLET | Freq: Two times a day (BID) | ORAL | Status: DC
  Administered 2020-08-26 – 2020-09-28 (×67): 40 mg via ORAL
  Filled 2020-08-26 (×67): qty 1

## 2020-08-26 MED ORDER — LOSARTAN POTASSIUM 25 MG PO TABS
25.0000 mg | ORAL_TABLET | Freq: Every day | ORAL | Status: DC
  Administered 2020-08-26 – 2020-09-01 (×7): 25 mg via ORAL
  Filled 2020-08-26 (×7): qty 1

## 2020-08-26 MED ORDER — FERROUS SULFATE 325 (65 FE) MG PO TABS
325.0000 mg | ORAL_TABLET | Freq: Two times a day (BID) | ORAL | Status: DC
  Administered 2020-08-26 – 2020-09-28 (×67): 325 mg via ORAL
  Filled 2020-08-26 (×68): qty 1

## 2020-08-26 MED ORDER — FUROSEMIDE 10 MG/ML IJ SOLN
40.0000 mg | Freq: Once | INTRAMUSCULAR | Status: AC
  Administered 2020-08-26: 40 mg via INTRAVENOUS
  Filled 2020-08-26: qty 4

## 2020-08-26 MED ORDER — SODIUM CHLORIDE 0.9 % IV SOLN: 200.0000 mg | Freq: Once | INTRAVENOUS | Status: DC

## 2020-08-26 MED ORDER — SODIUM CHLORIDE 0.9 % IV SOLN: 100.0000 mg | Freq: Every day | INTRAVENOUS | Status: DC

## 2020-08-26 MED ORDER — PREDNISONE 20 MG PO TABS: 50.0000 mg | ORAL_TABLET | Freq: Every day | ORAL | Status: DC

## 2020-08-26 MED ORDER — FOLIC ACID 1 MG PO TABS
1.0000 mg | ORAL_TABLET | Freq: Every evening | ORAL | Status: DC
  Administered 2020-08-26 – 2020-09-27 (×33): 1 mg via ORAL
  Filled 2020-08-26 (×35): qty 1

## 2020-08-26 MED ORDER — ZINC SULFATE 220 (50 ZN) MG PO CAPS
220.0000 mg | ORAL_CAPSULE | Freq: Every day | ORAL | Status: DC
  Administered 2020-08-26 – 2020-09-28 (×34): 220 mg via ORAL
  Filled 2020-08-26 (×34): qty 1

## 2020-08-26 MED ORDER — METHYLPREDNISOLONE SODIUM SUCC 125 MG IJ SOLR
1.0000 mg/kg | Freq: Two times a day (BID) | INTRAMUSCULAR | Status: DC
  Administered 2020-08-26 (×2): 96.25 mg via INTRAVENOUS
  Filled 2020-08-26 (×2): qty 2

## 2020-08-26 MED ORDER — ACETAMINOPHEN 325 MG PO TABS
650.0000 mg | ORAL_TABLET | Freq: Four times a day (QID) | ORAL | Status: DC | PRN
  Administered 2020-09-04 – 2020-09-15 (×5): 650 mg via ORAL
  Filled 2020-08-26 (×6): qty 2

## 2020-08-26 MED ORDER — FUROSEMIDE 80 MG PO TABS
80.0000 mg | ORAL_TABLET | ORAL | Status: DC
  Administered 2020-08-27 – 2020-08-31 (×5): 80 mg via ORAL
  Filled 2020-08-26: qty 4
  Filled 2020-08-26: qty 1
  Filled 2020-08-26: qty 4
  Filled 2020-08-26: qty 1
  Filled 2020-08-26: qty 4
  Filled 2020-08-26: qty 1

## 2020-08-26 MED ORDER — DEXAMETHASONE SODIUM PHOSPHATE 10 MG/ML IJ SOLN
10.0000 mg | Freq: Once | INTRAMUSCULAR | Status: AC
  Administered 2020-08-26: 10 mg via INTRAVENOUS
  Filled 2020-08-26: qty 1

## 2020-08-26 NOTE — Significant Event (Signed)
HOSPITAL MEDICINE OVERNIGHT EVENT NOTE    Notified by nursing she observed a small black stool. And possible external rectal mass, not actively bleeding however.  Chart reviewed.  Admitted for COVID 19, chronically on Coumadin for Afib.  Patient is currently hemodynamically stable.  On 5LPM via [place] saturating in the 90's.   I ordered a stool hemoccult for the next bowel movement.  Ordering a stat repeat H/H now.  If there is a substantial drop will stop Coumadin this evening.  Vernelle Emerald  MD [facility]

## 2020-08-26 NOTE — ED Provider Notes (Signed)
[facility] EMERGENCY DEPARTMENT Provider Note   CSN: [id number] Arrival date & time: 08/25/20  1752     History Chief Complaint  Patient presents with  . Shortness of Breath    [name] is a 74 y.o. male.  Patient with history of HFrEF (EF 25%) on furosemide, chronic kidney disease, AICD/pacemaker placement followed by Dr. Caryl Comes, h/o permanent afib on warfarin --presents to the emergency department with increasing shortness of breath.  Patient states that his breathing has been worse over the past week.  Prior to this he made 2 trips to [place] for a funeral; the first trip he drove approximately 24 to 48 hours, and the second trip he flew returning about 5 days ago.  He denies fevers, URI symptoms, cough.  Shortness of breath is worse with exertion.  He does not endorse any significant changes to his legs as far as pain or swelling.  No abdominal pain or swelling.  No nausea, vomiting, or diarrhea.  He does report taking a lower dose of furosemide while he was traveling.  He has had generalized weakness and malaise.  He has received 2 Covid vaccines.        Past Medical History:  Diagnosis Date  . AICD (automatic cardioverter/defibrillator) present   . Anemia   . Anxiety   . CHF (congestive heart failure) ([place])   . Chronic renal insufficiency   . Coronary artery disease   . Depression   . Dyslipidemia   . Dysrhythmia    atrial fibrillation  . ED (erectile dysfunction)   . GERD (gastroesophageal reflux disease)   . Incisional hernia, without obstruction or gangrene 04/10/2017  . Ischemic cardiomyopathy    EF 23%  . MI (myocardial infarction) ([place]) 2000   ANTERIOR, s/p PCI  . Other primary cardiomyopathies   . Peripheral vascular disease ([place])    dvt legs 2017  . Persistent atrial fibrillation (HCC)    on coumadin  . Presence of permanent cardiac pacemaker   . Shortness of breath dyspnea    on exertion    Patient Active Problem List    Diagnosis Date Noted  . Presence of permanent cardiac pacemaker   . Persistent atrial fibrillation ([place])   . Peripheral vascular disease ([place])   . ED (erectile dysfunction)   . Dysrhythmia   . Dyslipidemia   . Coronary artery disease   . Chronic renal insufficiency   . CHF (congestive heart failure) ([place])   . Anxiety   . AICD (automatic cardioverter/defibrillator) present   . Sepsis ([place]) 09/30/2017  . Acute renal failure superimposed on chronic kidney disease ([place]) 09/30/2017  . Incisional hernia, without obstruction or gangrene 04/10/2017  . Incisional hernia without obstruction or gangrene 04/10/2017  . Edema extremities 06/14/2016  . Low hemoglobin 05/24/2016  . Depression 03/15/2016  . 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 Increased frequency of urination 03/15/2016  . Nocturia 03/15/2016  . Other seborrheic keratosis 03/15/2016  . Inflamed seborrheic keratosis 03/15/2016  . Skin benign neoplasm 03/15/2016  . Anemia 03/06/2016  . Generalized abdominal pain 03/06/2016  . Urinary retention   . Hyperglycemia   . Atrial fibrillation with RVR ([place])   . Cardiomyopathy, ischemic-EF 20-25% 02/22/2016  . Anticoagulant long-term use 02/22/2016  . Epigastric pain 02/22/2016  . Hypotension 02/18/2016  . Chronic atrial fibrillation ([place]) 02/18/2016  . History of transurethral resection of prostate 02/18/2016  . CKD (chronic kidney disease) stage 3, GFR 30-59 ml/min 02/18/2016  . Chronic kidney  disease, stage III (moderate) 02/18/2016  . Gastrointestinal hemorrhage 02/18/2016  . Esophageal reflux 02/05/2016  . History of colon polyps 02/05/2016  . Cardiomyopathy ([place]) 02/05/2016  . BPH- s/p TURP 01/25/16 - followed by 4 weeks of Cipro 01/25/2016  . Benign prostatic hyperplasia 01/25/2016  . Nonischemic cardiomyopathy ([place])   . Noncompliance with treatment 06/08/2011  . 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 Sleep apnea 04/05/2011  .  Cardiac defibrillator in place 04/05/2011  . Essential hypertension 03/09/2010  . CAD S/P LAD PCI- 2000 03/09/2010  . Atrial fibrillation ([place]) 03/09/2010  . Chronic systolic heart failure ([place]) 03/09/2010  . Hyperlipidemia 03/09/2010  . Chronic coronary artery disease 03/09/2010  . Disorder resulting from impaired renal function 03/09/2010  . MI (myocardial infarction) ([place]) 12/19/1998    Past Surgical History:  Procedure Laterality Date  . AV NODE ABLATION N/A 01/25/2017   Procedure: AV Node Ablation;  Surgeon: [name], MD;  Location: [facility];  Service: Cardiovascular;  Laterality: N/A;  . AV NODE ABLATION  01/25/2017  . BIV ICD GENERATOR CHANGEOUT N/A 04/24/2020   Procedure: BIV ICD GENERATOR CHANGEOUT;  Surgeon: [name], MD;  Location: [facility];  Service: Cardiovascular;  Laterality: N/A;  . BIV ICD GENERTAOR CHANGE OUT N/A 09/16/2013   Procedure: BIV ICD GENERTAOR CHANGE OUT;  Surgeon: [name], MD;  Location: [facility] CATH LAB;  Service: Cardiovascular;  Laterality: N/A;  . BREAST LUMPECTOMY Left   . CARDIAC CATHETERIZATION  02/05/2008   MODERATE TO SEVERE LEFT VENTRICULAR  DYSFUNCTION WITH EF 25-30%  . CARDIAC DEFIBRILLATOR PLACEMENT  2009   MDT BI-VENTRICULAR ICD by Dr Caryl Comes  . COLONOSCOPY WITH PROPOFOL N/A 08/31/2016   Procedure: COLONOSCOPY WITH PROPOFOL;  Surgeon: Wilford Corner, MD;  Location[facility] END[facility]COPY;  Service: Endoscopy;  Laterality: N/A;  . CORONARY ANGIOPLASTY  2000   stent  . CORONARY STENT PLACEMENT     LAD  . CYSTOSCOPY N/A 01/25/2016   Procedure: CYSTOSCOPY;  Surgeon: Carolan Clines, MD;  Location: [facility] ORS;  Service: Urology;  Laterality: N/A;  . ESOPHAGOGASTRODUODENOSCOPY (EGD) WITH PROPOFOL N/A 08/31/2016   Procedure: ESOPHAGOGASTRODUODENOSCOPY (EGD) WITH PROPOFOL;  Surgeon: Wilford Corner, MD;  Location: [facility] ENDOSCOPY;  Service: Endoscopy;  Laterality: N/A;  . FRACTURE SURGERY Right 1972   leg  . IMPLANTABLE CARDIOVERTER  DEFIBRILLATOR GENERATOR CHANGE  2014  . INCISIONAL HERNIA REPAIR N/A 04/10/2017   Procedure: OPEN REPAIR INCISIONAL VENTRAL HERNIA;  Surgeon: [name], MD;  Location: [facility];  Service: General;  Laterality: N/A;  . INSERT / REPLACE / REMOVE PACEMAKER     ICD  . INSERTION OF MESH N/A 04/10/2017   Procedure: INSERTION OF MESH;  Surgeon: [name], MD;  Location: [facility];  Service: General;  Laterality: N/A;  . LAPAROTOMY N/A 02/22/2016   Procedure: EXPLORATORY LAPAROTOMY WITH  PATCH OF DUODENAL ULCER;  Surgeon: Rolm Bookbinder, MD;  Location: [facility];  Service: General;  Laterality: N/A;  . MULTIPLE TOOTH EXTRACTIONS    . TRANSTHORACIC ECHOCARDIOGRAM  12/2010   EF 30-35%  . TRANSURETHRAL RESECTION OF PROSTATE N/A 01/25/2016   Procedure: TRANSURETHRAL RESECTION OF THE PROSTATE (TURP);  Surgeon: Carolan Clines, MD;  Location: [facility] OR[facility];  Service: Urology;  Laterality: N/A;  . WRIST FRACTURE SURGERY Left 1972       Family History  Problem Relation Age of Onset  . Breast cancer Mother   . Depression Mother   . Heart attack Father   . Breast cancer  Sister     Social History   Tobacco Use  . Smoking status: Former Smoker    Types: Cigarettes    Quit date: 12/23/1998    Years since quitting: 21.6  . Smokeless tobacco: Never Used  Vaping Use  . Vaping Use: Never used  Substance Use Topics  . Alcohol use: No  . Drug use: No    Home Medications Prior to Admission medications   Medication Sig Start Date End Date Taking? Authorizing Provider  acetaminophen (TYLENOL) 500 MG tablet Take 500 mg by mouth every 6 (six) hours as needed for mild pain or moderate pain.    [provider]  digoxin (LANOXIN) 0.125 MG tablet Take 1 tablet (0.125 mg total) by mouth daily. Patient taking differently: Take 0.125 mg by mouth daily. Takes 1/2 tablet every other day 06/29/20   [place]ique, Peter M, MD  fenofibrate 160 MG tablet TAKE 1 TABLET BY MOUTH  DAILY Patient taking differently: Take 160  mg by mouth daily at 2 PM.  02/10/20   [place], Peter M, MD  ferrous sulfate 325 (65 FE) MG tablet Take 325 mg by mouth 2 (two) times daily.    [provider]  folic acid (FOLVITE) 333 MCG tablet Take 800 mcg by mouth every evening.     [provider]  furosemide (LASIX) 40 MG tablet TAKE 2 TABLETS BY MOUTH IN  THE MORNING AND 1 TABLET IN THE EVENING 04/17/20   [place], Peter M, MD  losartan (COZAAR) 25 MG tablet TAKE 1 TABLET BY MOUTH  DAILY Patient taking differently: Take 25 mg by mouth daily.  01/08/20   [place], Peter M, MD  Magnesium 250 MG TABS Take 500 mg by mouth daily.    [provider]  metoprolol succinate (TOPROL-XL) 25 MG 24 hr tablet TAKE 1 TABLET BY MOUTH  DAILY AT 12 NOON. Patient taking differently: Take 25 mg by mouth daily.  02/10/20   [place]ique, Peter M, MD  Multiple Vitamins-Minerals (MULTIVITAMIN ADULTS PO) Take 1 tablet by mouth daily. Senior    [provider]  nitroGLYCERIN (NITROSTAT) 0.4 MG SL tablet Place 1 tablet (0.4 mg total) under the tongue every 5 (five) minutes as needed for chest pain. 01/22/19 05/05/20  [place], Peter M, MD  pantoprazole (PROTONIX) 40 MG tablet TAKE 2 TABLETS BY MOUTH  DAILY Patient taking differently: Take 40 mg by mouth 2 (two) times daily.  03/11/20   [place], Peter M, MD  potassium chloride SA (KLOR-CON) 20 MEQ tablet TAKE 1 TABLET BY MOUTH  DAILY. TAKE ADDITIONAL  TABLET WHEN TAKING EXTRA  LASIX (FUROSEMIDE) Patient taking differently: Take 70 mEq by mouth.  10/22/19   [place], Peter M, MD  pravastatin (PRAVACHOL) 40 MG tablet TAKE 1 TABLET BY MOUTH  DAILY 04/17/20   [place], Peter M, MD  sertraline (ZOLOFT) 100 MG tablet Take 150 mg by mouth daily at 2 PM.     [provider]  vitamin C (ASCORBIC ACID) 250 MG tablet Take 250 mg by mouth 2 (two) times daily.    [provider]  warfarin (COUMADIN) 1 MG tablet Take 3 mg by mouth at bedtime. 2100-2200 01/09/20   [provider]    Allergies      Keflex [cephalexin]  Review of Systems   Review of Systems  Constitutional: Positive for appetite change, chills and fatigue. Negative for fever.  HENT: Negative for congestion, rhinorrhea and sore throat.   Eyes: Negative for redness.  Respiratory: Positive for shortness  of breath. Negative for cough and chest tightness.   Cardiovascular: Negative for chest pain.  Gastrointestinal: Negative for abdominal pain, diarrhea, nausea and vomiting.  Genitourinary: Negative for dysuria and hematuria.  Musculoskeletal: Positive for myalgias.  Skin: Negative for rash.  Neurological: Positive for weakness (generalized). Negative for headaches.    Physical Exam Updated Vital Signs BP (!) 131/56 (BP Location: Left Arm)   Pulse 80   Temp 98.7 F (37.1 C) (Oral)   Resp 15   Ht 5\' 10"  (1.778 m)   Wt 96 kg   SpO2 94%   BMI 30.37 kg/m   Physical Exam Vitals and nursing note reviewed.  Constitutional:      Appearance: He is well-developed.  HENT:     Head: Normocephalic and atraumatic.  Eyes:     General:        Right eye: No discharge.        Left eye: No discharge.     Conjunctiva/sclera: Conjunctivae normal.  Cardiovascular:     Rate and Rhythm: Normal rate and regular rhythm.     Heart sounds: Normal heart sounds.  Pulmonary:     Effort: Pulmonary effort is normal.     Breath sounds: Examination of the right-middle field reveals rales. Examination of the left-middle field reveals rales. Examination of the right-lower field reveals rales. Examination of the left-lower field reveals rales. Rales present. No decreased breath sounds, wheezing or rhonchi.  Abdominal:     Palpations: Abdomen is soft.     Tenderness: There is no abdominal tenderness.  Musculoskeletal:     Cervical back: Normal range of motion and neck supple.     Right lower leg: Edema present.     Left lower leg: Edema present.     Comments: 1+ pitting edema with venous status changes, to proximal ankles.   Skin:     General: Skin is warm and dry.  Neurological:     General: No focal deficit present.     Mental Status: He is alert.     ED Results / Procedures / Treatments   Labs (all labs ordered are listed, but only abnormal results are displayed) Labs Reviewed  SARS CORONAVIRUS 2 BY RT PCR ([facility]) - Abnormal; Notable for the following components:      Result Value   SARS Coronavirus 2 POSITIVE (*)    All other components within normal limits  BASIC METABOLIC PANEL - Abnormal; Notable for the following components:   Glucose, Bld 109 (*)    BUN 31 (*)    Creatinine, Ser 1.54 (*)    GFR calc non Af Amer 44 (*)    GFR calc Af Amer 51 (*)    All other components within normal limits  CBC - Abnormal; Notable for the following components:   RBC 4.21 (*)    Hemoglobin 10.7 (*)    HCT 35.0 (*)    MCH 25.4 (*)    RDW 15.6 (*)    All other components within normal limits  BRAIN NATRIURETIC PEPTIDE - Abnormal; Notable for the following components:   B Natriuretic Peptide 429.0 (*)    All other components within normal limits  PROTIME-INR - Abnormal; Notable for the following components:   Prothrombin Time 26.3 (*)    INR 2.5 (*)    All other components within normal limits  D-DIMER, QUANTITATIVE (NOT AT [facility]) - Abnormal; Notable for the following components:   D-Dimer, Quant 1.67 (*)  All other components within normal limits  TROPONIN I (HIGH SENSITIVITY) - Abnormal; Notable for the following components:   Troponin I (High Sensitivity) 25 (*)    All other components within normal limits  CULTURE, BLOOD (ROUTINE X 2)  CULTURE, BLOOD (ROUTINE X 2)  LACTIC ACID, PLASMA  LACTIC ACID, PLASMA  COMPREHENSIVE METABOLIC PANEL  PROCALCITONIN  LACTATE DEHYDROGENASE  FERRITIN  TRIGLYCERIDES  FIBRINOGEN  C-REACTIVE PROTEIN  TROPONIN I (HIGH SENSITIVITY)    EKG EKG Interpretation  Date/Time:  Tuesday August 25 2020 17:56:17  EDT Ventricular Rate:  71 PR Interval:    QRS Duration: 146 QT Interval:  476 QTC Calculation: 517 R Axis:   -138 Text Interpretation: Ventricular-paced rhythm Abnormal ECG No significant change since last tracing Confirmed by Isla Pence [phone]) on 08/26/2020 7:12:56 AM   Radiology DG Chest 2 View  Result Date: 08/25/2020 CLINICAL DATA:  Shortness of breath EXAM: CHEST - 2 VIEW COMPARISON:  04/30/2018 FINDINGS: Right-sided multi lead pacing device. Mild cardiomegaly. No pleural effusion. Diffuse left interstitial and ground-glass opacity, asymmetric to the right. Aortic atherosclerosis. No pneumothorax. IMPRESSION: Diffuse left interstitial and ground-glass opacity, asymmetric to the right, potentially due to diffuse pneumonia versus asymmetric edema. Electronically Signed   By: Donavan Foil M.D.   On: 08/25/2020 19:05    Procedures Procedures (including critical care time)  Medications Ordered in ED Medications - No data to display  ED Course  I have reviewed the triage vital signs and the nursing notes.  Pertinent labs & imaging results that were available during my care of the patient were reviewed by me and considered in my medical decision making (see chart for details).  Patient seen and examined. Work-up initiated. Reviewed PCP note from yesterday. EKG reviewed.   Vital signs reviewed and are as follows: BP (!) 131/56 (BP Location: Left Arm)   Pulse 80   Temp 98.7 F (37.1 C) (Oral)   Resp 15   Ht 5\' 10"  (1.778 m)   Wt 96 kg   SpO2 94%   BMI 30.37 kg/m   Hypoxic into low 80's with ambulation.   10:28 AM COVID positive. O2 mid-90's on 2L. Looks comfortable. Pt updated on results. Will admit, he is unassigned.   10:43 AM Spoke with Dr. Doristine Bosworth who will see patient.     JADARIAN MCKAY was evaluated in Emergency Department on 08/26/2020 for the symptoms described in the history of present illness. He was evaluated in the context of the global COVID-19 pandemic,  which necessitated consideration that the patient might be at risk for infection with the SARS-CoV-2 virus that causes COVID-19. Institutional protocols and algorithms that pertain to the evaluation of patients at risk for COVID-19 are in a state of rapid change based on information released by regulatory bodies including the CDC and federal and state organizations. These policies and algorithms were followed during the patient's care in the ED.  CRITICAL CARE Performed by: Carlisle Cater PA-C Total critical care time: 35 minutes Critical care time was exclusive of separately billable procedures and treating other patients. Critical care was necessary to treat or prevent imminent or life-threatening deterioration. Critical care was time spent personally by me on the following activities: development of treatment plan with patient and/or surrogate as well as nursing, discussions with consultants, evaluation of patient's response to treatment, examination of patient, obtaining history from patient or surrogate, ordering and performing treatments and interventions, ordering and review of laboratory studies, ordering and review of radiographic studies, pulse  oximetry and re-evaluation of patient's condition.     MDM Rules/Calculators/A&P                          Admit -- hypoxic resp failure 2/2 COVID PNA. This is a breakthrough infection.    Final Clinical Impression(s) / ED Diagnoses Final diagnoses:  Acute respiratory failure with hypoxia ([place])  COVID-19  Chronic combined systolic and diastolic congestive heart failure [facility])    Rx / DC Orders ED Discharge Orders    None       Carlisle Cater, PA-C 08/26/20 1043    Isla Pence, MD 08/26/20 1112

## 2020-08-26 NOTE — ED Notes (Signed)
Pt sitting up and eating lunch tray

## 2020-08-26 NOTE — ED Notes (Signed)
Took pt off supplemental O2 and ambulated in room. Pt became SOB just truing to get out of bed. Pt O2 dropped to 86%  Pt returned to bed with assistance and placed [place] 2 liters back on pt

## 2020-08-26 NOTE — ED Notes (Signed)
Pacemaker interrogated. 

## 2020-08-26 NOTE — Progress Notes (Signed)
ANTICOAGULATION CONSULT NOTE - Initial Consult  Pharmacy Consult for warfarin Indication: atrial fibrillation  Allergies  Allergen Reactions  . Keflex [Cephalexin] Shortness Of Breath and Other (See Comments)    dizziness    Patient Measurements: Height: 5\' 10"  (177.8 cm) Weight: 96 kg (211 lb 10.3 oz) IBW/kg (Calculated) : 73   Vital Signs: Temp: 98.7 F (37.1 C) (09/08 0536) Temp Source: Oral (09/08 0536) BP: 137/59 (09/08 0945) Pulse Rate: 74 (09/08 0945)  Labs: Recent Labs    08/25/20 1813 08/26/20 0826  HGB 10.7*  --   HCT 35.0*  --   PLT 332  --   LABPROT  --  26.3*  INR  --  2.5*  CREATININE 1.54*  --   TROPONINIHS  --  25*    Estimated Creatinine Clearance: 50.4 mL/min (A) (by C-G formula based on SCr of 1.54 mg/dL (H)).   Medical History: Past Medical History:  Diagnosis Date  . AICD (automatic cardioverter/defibrillator) present   . Anemia   . Anxiety   . CHF (congestive heart failure) ([place])   . Chronic renal insufficiency   . Coronary artery disease   . Depression   . Dyslipidemia   . Dysrhythmia    atrial fibrillation  . ED (erectile dysfunction)   . GERD (gastroesophageal reflux disease)   . Incisional hernia, without obstruction or gangrene 04/10/2017  . Ischemic cardiomyopathy    EF 23%  . MI (myocardial infarction) ([place]) 2000   ANTERIOR, s/p PCI  . Other primary cardiomyopathies   . Peripheral vascular disease ([place])    dvt legs 2017  . Persistent atrial fibrillation (HCC)    on coumadin  . Presence of permanent cardiac pacemaker   . Shortness of breath dyspnea    on exertion   Assessment: 89 YOM presenting with SOB COVID +, hx of afib on warfarin PTA.  INR therapeutic on admission at 2.5, last dose taken 9/6  PTA dosing: 3mg  daily  Goal of Therapy:  INR 2-3 Monitor platelets by anticoagulation protocol: Yes   Plan:  Warfarin 3mg  PO x 1 today Daily INR, s/s bleeding  Bertis Ruddy, PharmD Clinical Pharmacist ED  Pharmacist Phone # [phone] 08/26/2020 10:59 AM

## 2020-08-26 NOTE — ED Notes (Signed)
Pt placed on O2 monitoring. SpO2 87-88% RA. Placed on 2 liters with some relief to pt feeling SOB 96%

## 2020-08-26 NOTE — H&P (Signed)
History and Physical    [name]:[id number] DOB: 05-Mar-1946 DOA: 08/25/2020  PCP: [name] Major, MD  Patient coming from: Home I have personally briefly reviewed patient's old medical records in [place]  Chief Complaint: Worsening shortness of breath and cough  HPI: [name] is a 74 y.o. male with medical history significant of nonischemic cardiomyopathy with ejection fraction of 25-30%, status post ICD placement, permanent A. fib/atrial flutter-on Coumadin, hypertension, hyperlipidemia, depression/anxiety, GERD, presents to emergency department with worsening shortness of breath and cough since 1 week.  Patient tells me that he is fully vaccinated against COVID-19 with [place] in May.  He recently made a trip to [place] for funeral and was around a lot of people.  Patient tells me that due to recent travel and busy schedule he was not able to take his Lasix, digoxin and Coumadin as prescribed.  Reports worsening shortness of breath even at rest, cough, congestion, loose stool, generalized weakness and fatigue however denies fever, chills, wheezing, leg swelling, orthopnea, PND, chest pain, headache, blurry vision, abdominal pain, nausea, vomiting, abdominal pain, loss of sense of taste or smell.  No history of smoking, alcohol, illicit drug use.  ED Course: Upon arrival to ED: Patient tachypneic, hypoxemic requiring 2 L of oxygen via nasal cannula, COVID-19 positive, troponin: 25, D-dimer: 1.67, no leukocytosis, normocytic anemia, BNP: 429, CKD stage III stable, chest x-ray shows diffuse left interstitial and groundglass opacity, asymmetric to the right, potentially due to diffuse pneumonia versus asymmetric edema.  Patient received Lasix 40, remdesivir and Decadron in ED.  [facility] hospitalist consulted for admission for acute hypoxemic respiratory failure secondary to COVID-19 pneumonia.  Review of Systems: As per HPI otherwise negative.    Past Medical History:   Diagnosis Date  . AICD (automatic cardioverter/defibrillator) present   . Anemia   . Anxiety   . CHF (congestive heart failure) ([place])   . Chronic renal insufficiency   . Coronary artery disease   . Depression   . Dyslipidemia   . Dysrhythmia    atrial fibrillation  . ED (erectile dysfunction)   . GERD (gastroesophageal reflux disease)   . Incisional hernia, without obstruction or gangrene 04/10/2017  . Ischemic cardiomyopathy    EF 23%  . MI (myocardial infarction) ([place]) 2000   ANTERIOR, s/p PCI  . Other primary cardiomyopathies   . Peripheral vascular disease ([place])    dvt legs 2017  . Persistent atrial fibrillation (HCC)    on coumadin  . Presence of permanent cardiac pacemaker   . Shortness of breath dyspnea    on exertion    Past Surgical History:  Procedure Laterality Date  . AV NODE ABLATION N/A 01/25/2017   Procedure: AV Node Ablation;  Surgeon: [name], MD;  Location: [facility];  Service: Cardiovascular;  Laterality: N/A;  . AV NODE ABLATION  01/25/2017  . BIV ICD GENERATOR CHANGEOUT N/A 04/24/2020   Procedure: BIV ICD GENERATOR CHANGEOUT;  Surgeon: [name], MD;  Location: [facility];  Service: Cardiovascular;  Laterality: N/A;  . BIV ICD GENERTAOR CHANGE OUT N/A 09/16/2013   Procedure: BIV ICD GENERTAOR CHANGE OUT;  Surgeon: [name], MD;  Location: [facility] C[facility] LAB;  Service: Cardiovascular;  Laterality: N/A;  . BREAST LUMPECTOMY Left   . CARDIAC CATHETERIZATION  02/05/2008   MODERATE TO SEVERE LEFT VENTRICULAR  DYSFUNCTION WITH EF 25-30%  . CARDIAC DEFIBRILLATOR PLACEMENT  2009   MDT BI-VENTRICULAR ICD by Dr  Caryl Comes  . COLONOSCOPY WITH PROPOFOL N/A 08/31/2016   Procedure: COLONOSCOPY WITH PROPOFOL;  Surgeon: Wilford Corner, MD;  Location[facility] ENDOSCOPY;  Service: Endoscopy;  Laterality: N/A;  . CORONARY ANGIOPLASTY  2000   stent  . CORONARY STENT PLACEMENT     LAD  . CYSTOSCOPY N/A 01/25/2016   Procedure: CYSTOSCOPY;  Surgeon: Carolan Clines, MD;  Location: [facility] ORS;  Service: Urology;  Laterality: N/A;  . ESOPHAGOGASTRODUODENOSCOPY (EGD) WITH PROPOFOL N/A 08/31/2016   Procedure: ESOPHAGOGASTRODUODENOSCOPY (EGD) WITH PROPOFOL;  Surgeon: Wilford Corner, MD;  Location: [facility] ENDOSCOPY;  Service: Endoscopy;  Laterality: N/A;  . FRACTURE SURGERY Right 1972   leg  . IMPLANTABLE CARDIOVERTER DEFIBRILLATOR GENERATOR CHANGE  2014  . INCISIONAL HERNIA REPAIR N/A 04/10/2017   Procedure: OPEN REPAIR INCISIONAL VENTRAL HERNIA;  Surgeon: [name], MD;  Location: [facility];  Service: General;  Laterality: N/A;  . INSERT / REPLACE / REMOVE PACEMAKER     ICD  . INSERTION OF MESH N/A 04/10/2017   Procedure: INSERTION OF MESH;  Surgeon: [name], MD;  Location: [facility];  Service: General;  Laterality: N/A;  . LAPAROTOMY N/A 02/22/2016   Procedure: EXPLORATORY LAPAROTOMY WITH  PATCH OF DUODENAL ULCER;  Surgeon: Rolm Bookbinder, MD;  Location: [facility];  Service: General;  Laterality: N/A;  . MULTIPLE TOOTH EXTRACTIONS    . TRANSTHORACIC ECHOCARDIOGRAM  12/2010   EF 30-35%  . TRANSURETHRAL RESECTION OF PROSTATE N/A 01/25/2016   Procedure: TRANSURETHRAL RESECTION OF THE PROSTATE (TURP);  Surgeon: Carolan Clines, MD;  Location: [facility] OR[facility];  Service: Urology;  Laterality: N/A;  . WRIST FRACTURE SURGERY Left 1972     reports that he quit smoking about 21 years ago. His smoking use included cigarettes. He has never used smokeless tobacco. He reports that he does not drink alcohol and does not use drugs.  Allergies  Allergen Reactions  . Keflex [Cephalexin] Shortness Of Breath and Other (See Comments)    dizziness    Family History  Problem Relation Age of Onset  . Breast cancer Mother   . Depression Mother   . Heart attack Father   . Breast cancer Sister     Prior to Admission medications   Medication Sig Start Date End Date Taking? Authorizing Provider  acetaminophen (TYLENOL) 500 MG tablet Take 500 mg by mouth every 8 (eight) hours  as needed for mild pain or moderate pain.    Yes [provider]  digoxin (LANOXIN) 0.125 MG tablet Take 1 tablet (0.125 mg total) by mouth daily. Patient taking differently: Take 0.0625 mg by mouth every other day.  06/29/20  Yes [place], Peter M, MD  fenofibrate 160 MG tablet TAKE 1 TABLET BY MOUTH  DAILY Patient taking differently: Take 160 mg by mouth daily at 2 PM.  02/10/20  Yes [place], Peter M, MD  ferrous sulfate 325 (65 FE) MG tablet Take 325 mg by mouth 2 (two) times daily.   Yes [provider]  folic acid (FOLVITE) 315 MCG tablet Take 800 mcg by mouth every evening.    Yes [provider]  furosemide (LASIX) 40 MG tablet TAKE 2 TABLETS BY MOUTH IN  THE MORNING AND 1 TABLET IN THE EVENING Patient taking differently: Take 40-80 mg by mouth See admin instructions. TAKE 80mg  BY MOUTH IN  THE MORNING AND 40mg  IN THE EVENING 04/17/20  Yes [place], Peter M, MD  losartan (COZAAR) 25 MG tablet TAKE 1 TABLET BY MOUTH  DAILY Patient taking differently: Take 25 mg  by mouth daily.  01/08/20  Yes [place], Peter M, MD  Magnesium 250 MG TABS Take 500 mg by mouth daily.   Yes [provider]  metoprolol succinate (TOPROL-XL) 25 MG 24 hr tablet TAKE 1 TABLET BY MOUTH  DAILY AT 12 NOON. Patient taking differently: Take 25 mg by mouth daily.  02/10/20  Yes [place], Peter M, MD  Multiple Vitamins-Minerals (MULTIVITAMIN ADULTS PO) Take 1 tablet by mouth daily. Senior   Yes [provider]  nitroGLYCERIN (NITROSTAT) 0.4 MG SL tablet Place 1 tablet (0.4 mg total) under the tongue every 5 (five) minutes as needed for chest pain. 01/22/19 08/26/20 Yes [place], Peter M, MD  pantoprazole (PROTONIX) 40 MG tablet TAKE 2 TABLETS BY MOUTH  DAILY Patient taking differently: Take 40 mg by mouth 2 (two) times daily.  03/11/20  Yes [place], Peter M, MD  potassium chloride SA (KLOR-CON) 20 MEQ tablet TAKE 1 TABLET BY MOUTH  DAILY. TAKE ADDITIONAL  TABLET WHEN TAKING EXTRA  LASIX  (FUROSEMIDE) Patient taking differently: Take 70 mEq by mouth.  10/22/19  Yes [place], Peter M, MD  pravastatin (PRAVACHOL) 40 MG tablet TAKE 1 TABLET BY MOUTH  DAILY Patient taking differently: Take 40 mg by mouth daily.  04/17/20  Yes [place], Peter M, MD  sertraline (ZOLOFT) 100 MG tablet Take 150 mg by mouth daily at 2 PM.    Yes [provider]  vitamin C (ASCORBIC ACID) 250 MG tablet Take 250 mg by mouth 2 (two) times daily.   Yes [provider]  warfarin (COUMADIN) 1 MG tablet Take 3 mg by mouth at bedtime. 2100-2200 01/09/20  Yes [provider]    Physical Exam: Vitals:   08/26/20 0900 08/26/20 0915 08/26/20 0930 08/26/20 0945  BP: (!) 141/67 111/82 100/85 (!) 137/59  Pulse: 70 70 69 74  Resp: (!) 26 (!) 33  20  Temp:      TempSrc:      SpO2: (!) 85% 91% 90% 98%  Weight:      Height:        Constitutional: In mild respiratory distress, on 2 L of oxygen via nasal cannula, Eyes: PERRL, lids and conjunctivae normal ENMT: Mucous membranes are moist. Posterior pharynx clear of any exudate or lesions.Normal dentition.  Neck: normal, supple, no masses, no thyromegaly Respiratory: Tachypneic however no wheezing or rhonchi noted. Cardiovascular: Regular rate and rhythm, no murmurs / rubs / gallops. No extremity edema. 2+ pedal pulses. No carotid bruits.  Abdomen: no tenderness, no masses palpated. No hepatosplenomegaly. Bowel sounds positive.  Musculoskeletal: no clubbing / cyanosis. No joint deformity upper and lower extremities. Good ROM, no contractures. Normal muscle tone.  Skin: no rashes, lesions, ulcers. No induration Neurologic: CN 2-12 grossly intact. Sensation intact, DTR normal. Strength 5/5 in all 4.  Psychiatric: Normal judgment and insight. Alert and oriented x 3. Normal mood.    Labs on Admission: I have personally reviewed following labs and imaging studies  CBC: Recent Labs  Lab 08/25/20 1813  WBC 7.9  HGB 10.7*  HCT 35.0*  MCV 83.1   PLT 633   Basic Metabolic Panel: Recent Labs  Lab 08/25/20 1813  NA 142  K 4.1  CL 107  CO2 24  GLUCOSE 109*  BUN 31*  CREATININE 1.54*  CALCIUM 9.9   GFR: Estimated Creatinine Clearance: 50.4 mL/min (A) (by C-G formula based on SCr of 1.54 mg/dL (H)). Liver Function Tests: No results for input(s): AST, ALT, ALKPHOS, BILITOT, PROT, ALBUMIN in  the last 168 hours. No results for input(s): LIPASE, AMYLASE in the last 168 hours. No results for input(s): AMMONIA in the last 168 hours. Coagulation Profile: Recent Labs  Lab 08/26/20 0826  INR 2.5*   Cardiac Enzymes: No results for input(s): CKTOTAL, CKMB, CKMBINDEX, TROPONINI in the last 168 hours. BNP (last 3 results) No results for input(s): PROBNP in the last 8760 hours. HbA1C: No results for input(s): HGBA1C in the last 72 hours. CBG: No results for input(s): GLUCAP in the last 168 hours. Lipid Profile: No results for input(s): CHOL, HDL, LDLCALC, TRIG, CHOLHDL, LDLDIRECT in the last 72 hours. Thyroid Function Tests: No results for input(s): TSH, T4TOTAL, FREET4, T3FREE, THYROIDAB in the last 72 hours. Anemia Panel: No results for input(s): VITAMINB12, FOLATE, FERRITIN, TIBC, IRON, RETICCTPCT in the last 72 hours. Urine analysis:    Component Value Date/Time   COLORURINE AMBER (A) 09/29/2017 2310   APPEARANCEUR HAZY (A) 09/29/2017 2310   LABSPEC 1.013 09/29/2017 2310   PHURINE 5.0 09/29/2017 2310   GLUCOSEU NEGATIVE 09/29/2017 2310   HGBUR NEGATIVE 09/29/2017 2310   BILIRUBINUR NEGATIVE 09/29/2017 2310   KETONESUR NEGATIVE 09/29/2017 2310   PROTEINUR NEGATIVE 09/29/2017 2310   NITRITE NEGATIVE 09/29/2017 2310   LEUKOCYTESUR NEGATIVE 09/29/2017 2310    Radiological Exams on Admission: DG Chest 2 View  Result Date: 08/25/2020 CLINICAL DATA:  Shortness of breath EXAM: CHEST - 2 VIEW COMPARISON:  04/30/2018 FINDINGS: Right-sided multi lead pacing device. Mild cardiomegaly. No pleural effusion. Diffuse left  interstitial and ground-glass opacity, asymmetric to the right. Aortic atherosclerosis. No pneumothorax. IMPRESSION: Diffuse left interstitial and ground-glass opacity, asymmetric to the right, potentially due to diffuse pneumonia versus asymmetric edema. Electronically Signed   By: Donavan Foil M.D.   On: 08/25/2020 19:05    EKG: Independently reviewed.  Ventricular paced rhythm.  No ST elevation or depression noted.  Assessment/Plan Principal Problem:   Acute hypoxemic respiratory failure due to COVID-19 [facility]) Active Problems:   Essential hypertension   CAD S/P LAD PCI- 2000   Atrial fibrillation (HCC)   Chronic systolic heart failure (HCC)   Nonischemic cardiomyopathy (HCC)   CKD (chronic kidney disease) stage 3, GFR 30-59 ml/min   Hyperlipidemia   Acute hypoxemic respiratory failure due to COVID-19 pneumonia: -Patient presented with tachypnea, hypoxia requiring 2 L of oxygen via nasal cannula, COVID-19 positive, reviewed chest x-ray.  He is afebrile with no leukocytosis.  Received remdesivir and Decadron in ED. -Admit patient on the floor.  On continuous pulse ox.  We will try to wean off of oxygen as tolerated. -Continue remdesivir.  Started patient on Solu-Medrol. -Ordered inflammatory marker-pending. -Although patient is noncompliant with Coumadin and has recent travel with D-dimer of 1.67-his INR is 2.5 which is therapeutic.  We will hold off further imaging (CTA chest or Doppler ultrasound) -Monitor vitals closely. -Start on albuterol every 6 hour, p.o. vitamin and antitussive.  Acute on chronic systolic CHF: -Has not been compliant with diuretics lately to avoid urinary frequency due to travel -BNP: 429.  Reviewed chest x-ray. -Received Lasix 40 IV once in ED. -Continue Lasix 80 in the morning and 40 in the evening, digoxin, statin, metoprolol and losartan.  -Reviewed echo from 10/01/2017 which showed ejection fraction of 25 to 30%.  Mild pulmonary hypertension. - Strict  INO's and daily weight.  Monitor electrolytes closely.  Permanent A. fib: On Coumadin, metoprolol -INR is therapeutic. -Consult pharmacy for Coumadin.  Continue metoprolol.  On telemetry.  Hypertension: Blood pressure is a  stable -Continue losartan and metoprolol.  Monitor blood pressure closely  CKD stage III: Stable -Continue to monitor  Coronary artery disease status post PCI: Stable.  Patient denies ACS symptoms -Continue statin, nitro as needed, metoprolol, fenofibrate, losartan.  Hyperlipidemia: Continue fenofibrate and pravastatin.  Depression/anxiety: Continue Zoloft  GERD: Continue PPI  Nonischemic cardiomyopathy status post ICD placement: -Stable.  Patient's ICD was interrogated in ED. -Continue home meds.  DVT prophylaxis: Coumadin/SCD Code Status: Full code Family Communication: None present at bedside.  Plan of care discussed with patient in length and he verbalized understanding and agreed with it. Disposition Plan: Home in 3 to 4 days Consults called: None Admission status: Inpatient   Mckinley Jewel MD [facility]  If 7PM-7AM, please contact night-coverage [email or web address] Password [facility]  08/26/2020, 10:46 AM

## 2020-08-26 NOTE — ED Notes (Signed)
Help get patient pulled up in the bed patient is resting with call bell in reach  

## 2020-08-26 NOTE — ED Notes (Addendum)
Pt got up to use bedside commode, pt had small black BM. RN noticed mass around rectum, admitting paged. SpO2 decreased to 80% on 1.5L via [place] w/ exertion. RN got pt back in bed, increased O2 to 5L, SpO2 88%, pt in no distress, speaking in full sentences.

## 2020-08-26 NOTE — ED Notes (Signed)
Patient brief changed. Patient reports loose stool. Blood in stool from hemroids per patient.

## 2020-08-26 NOTE — ED Notes (Signed)
Went in pt room to administer medications and pt was found disconnected from telemetry and his oxygen and walking around the room. Pt O2 sats 77% when oximeter and O2 applied. Instructed pt on taking deep breaths in his nose to get the oxygen. Pt verbalized understanding. Educated pt on risks of not using call bell and detaching hisself from telemetry and O2. Pt verbalized understanding.

## 2020-08-26 NOTE — ED Notes (Signed)
Call wife Santiago Glad at [phone] when covid test resulted

## 2020-08-27 DIAGNOSIS — Z9861 Coronary angioplasty status: Secondary | ICD-10-CM

## 2020-08-27 DIAGNOSIS — I10 Essential (primary) hypertension: Secondary | ICD-10-CM

## 2020-08-27 DIAGNOSIS — I5042 Chronic combined systolic (congestive) and diastolic (congestive) heart failure: Secondary | ICD-10-CM

## 2020-08-27 DIAGNOSIS — I428 Other cardiomyopathies: Secondary | ICD-10-CM

## 2020-08-27 DIAGNOSIS — I2510 Atherosclerotic heart disease of native coronary artery without angina pectoris: Secondary | ICD-10-CM

## 2020-08-27 DIAGNOSIS — J1282 Pneumonia due to coronavirus disease 2019: Secondary | ICD-10-CM

## 2020-08-27 LAB — FERRITIN: Ferritin: 1724 ng/mL — ABNORMAL HIGH (ref 24–336)

## 2020-08-27 LAB — COMPREHENSIVE METABOLIC PANEL
ALT: 22 U/L (ref 0–44)
AST: 31 U/L (ref 15–41)
Albumin: 2.6 g/dL — ABNORMAL LOW (ref 3.5–5.0)
Alkaline Phosphatase: 46 U/L (ref 38–126)
Anion gap: 14 (ref 5–15)
BUN: 28 mg/dL — ABNORMAL HIGH (ref 8–23)
CO2: 23 mmol/L (ref 22–32)
Calcium: 9.3 mg/dL (ref 8.9–10.3)
Chloride: 104 mmol/L (ref 98–111)
Creatinine, Ser: 1.3 mg/dL — ABNORMAL HIGH (ref 0.61–1.24)
GFR calc Af Amer: >60 mL/min (ref >60)
GFR calc non Af Amer: 55 mL/min — ABNORMAL LOW (ref >60)
Glucose, Bld: 153 mg/dL — ABNORMAL HIGH (ref 70–99)
Potassium: 4.2 mmol/L (ref 3.5–5.1)
Sodium: 141 mmol/L (ref 135–145)
Total Bilirubin: 0.4 mg/dL (ref 0.3–1.2)
Total Protein: 6.5 g/dL (ref 6.5–8.1)

## 2020-08-27 LAB — PROTIME-INR
INR: 3.6 — ABNORMAL HIGH (ref 0.8–1.2)
Prothrombin Time: 34.6 seconds — ABNORMAL HIGH (ref 11.4–15.2)

## 2020-08-27 LAB — CBC WITH DIFFERENTIAL/PLATELET
Abs Immature Granulocytes: 0.05 10*3/uL (ref 0.00–0.07)
Basophils Absolute: 0 10*3/uL (ref 0.0–0.1)
Basophils Relative: 0 %
Eosinophils Absolute: 0 10*3/uL (ref 0.0–0.5)
Eosinophils Relative: 0 %
HCT: 34.6 % — ABNORMAL LOW (ref 39.0–52.0)
Hemoglobin: 10.5 g/dL — ABNORMAL LOW (ref 13.0–17.0)
Immature Granulocytes: 1 %
Lymphocytes Relative: 8 %
Lymphs Abs: 0.6 10*3/uL — ABNORMAL LOW (ref 0.7–4.0)
MCH: 25.1 pg — ABNORMAL LOW (ref 26.0–34.0)
MCHC: 30.3 g/dL (ref 30.0–36.0)
MCV: 82.8 fL (ref 80.0–100.0)
Monocytes Absolute: 0.2 10*3/uL (ref 0.1–1.0)
Monocytes Relative: 2 %
Neutro Abs: 7 10*3/uL (ref 1.7–7.7)
Neutrophils Relative %: 89 %
Platelets: 370 10*3/uL (ref 150–400)
RBC: 4.18 MIL/uL — ABNORMAL LOW (ref 4.22–5.81)
RDW: 15.4 % (ref 11.5–15.5)
WBC: 7.6 10*3/uL (ref 4.0–10.5)
nRBC: 0 % (ref 0.0–0.2)

## 2020-08-27 LAB — MAGNESIUM: Magnesium: 1.8 mg/dL (ref 1.7–2.4)

## 2020-08-27 LAB — CBG MONITORING, ED: Glucose-Capillary: 172 mg/dL — ABNORMAL HIGH (ref 70–99)

## 2020-08-27 LAB — C-REACTIVE PROTEIN: CRP: 9.5 mg/dL — ABNORMAL HIGH (ref <1.0)

## 2020-08-27 LAB — HEMOGLOBIN AND HEMATOCRIT, BLOOD
HCT: 36.8 % — ABNORMAL LOW (ref 39.0–52.0)
Hemoglobin: 11.6 g/dL — ABNORMAL LOW (ref 13.0–17.0)

## 2020-08-27 LAB — PHOSPHORUS: Phosphorus: 4.2 mg/dL (ref 2.5–4.6)

## 2020-08-27 LAB — D-DIMER, QUANTITATIVE: D-Dimer, Quant: >20 ug/mL-FEU — ABNORMAL HIGH (ref 0.00–0.50)

## 2020-08-27 MED ORDER — METHYLPREDNISOLONE SODIUM SUCC 125 MG IJ SOLR
80.0000 mg | Freq: Two times a day (BID) | INTRAMUSCULAR | Status: DC
  Administered 2020-08-27 – 2020-09-03 (×15): 80 mg via INTRAVENOUS
  Filled 2020-08-27 (×15): qty 2

## 2020-08-27 MED ORDER — ZOLPIDEM TARTRATE 5 MG PO TABS
5.0000 mg | ORAL_TABLET | Freq: Every evening | ORAL | Status: DC | PRN
  Administered 2020-08-27 – 2020-09-26 (×14): 5 mg via ORAL
  Filled 2020-08-27 (×14): qty 1

## 2020-08-27 MED ORDER — HALOPERIDOL LACTATE 5 MG/ML IJ SOLN: 1.0000 mg | Freq: Four times a day (QID) | INTRAMUSCULAR | Status: DC | PRN

## 2020-08-27 NOTE — ED Notes (Signed)
Cleaned patient and changed disposable pads from under the patient. Repositioned for optimum comfort.

## 2020-08-27 NOTE — Progress Notes (Signed)
[place] for warfarin Indication: atrial fibrillation  Allergies  Allergen Reactions  . Keflex [Cephalexin] Shortness Of Breath and Other (See Comments)    dizziness    Patient Measurements: Height: 5\' 10"  (177.8 cm) Weight: 96 kg (211 lb 10.3 oz) IBW/kg (Calculated) : 73   Vital Signs: Temp: 97.8 F (36.6 C) (09/09 0700) Temp Source: Oral (09/09 0700) BP: 141/70 (09/09 0415) Pulse Rate: 69 (09/09 0700)  Labs: Recent Labs    08/25/20 1813 08/25/20 1813 08/26/20 0826 08/26/20 1200 08/27/20 0221 08/27/20 0454  HGB 10.7*   < >  --   --  11.6* 10.5*  HCT 35.0*  --   --   --  36.8* 34.6*  PLT 332  --   --   --   --  370  LABPROT  --   --  26.3*  --   --  34.6*  INR  --   --  2.5*  --   --  3.6*  CREATININE 1.54*  --   --  1.40*  --  1.30*  TROPONINIHS  --   --  25*  --   --   --    < > = values in this interval not displayed.    Estimated Creatinine Clearance: 59.7 mL/min (A) (by C-G formula based on SCr of 1.3 mg/dL (H)).   Medical History: Past Medical History:  Diagnosis Date  . AICD (automatic cardioverter/defibrillator) present   . Anemia   . Anxiety   . CHF (congestive heart failure) ([place])   . Chronic renal insufficiency   . Coronary artery disease   . Depression   . Dyslipidemia   . Dysrhythmia    atrial fibrillation  . ED (erectile dysfunction)   . GERD (gastroesophageal reflux disease)   . Incisional hernia, without obstruction or gangrene 04/10/2017  . Ischemic cardiomyopathy    EF 23%  . MI (myocardial infarction) ([place]) 2000   ANTERIOR, s/p PCI  . Other primary cardiomyopathies   . Peripheral vascular disease ([place])    dvt legs 2017  . Persistent atrial fibrillation (HCC)    on coumadin  . Presence of permanent cardiac pacemaker   . Shortness of breath dyspnea    on exertion   Assessment: [name] presenting with SOB COVID +, hx of afib on warfarin PTA.  INR therapeutic on admission at 2.5, last dose  taken 9/6  PTA dosing: 3mg  daily  INR supratherapeutic this AM at 3.6, H/H remains low but stable, plts wnl.    Goal of Therapy:  INR 2-3 Monitor platelets by anticoagulation protocol: Yes   Plan:  Hold warfarin today Daily INR, s/s bleeding, f/u hemoccult   Bertis Ruddy, PharmD Clinical Pharmacist ED Pharmacist Phone # [phone] 08/27/2020 7:16 AM

## 2020-08-27 NOTE — ED Notes (Signed)
Patient is resting comfortably. 

## 2020-08-27 NOTE — Progress Notes (Addendum)
[facility] PROGRESS NOTE    Progress Note  [name]  FTD:[id number] DOB: 1946/06/19 DOA: 08/25/2020 PCP: [name], MD     Brief Narrative:   [name] is an 74 y.o. male past medical history significant for nonischemic cardiomyopathy with an EF of 25% status post AICD, permanent atrial fibrillation/atrial flutter on Coumadin who is fully vaccinated in May went [place] several days prior to admission started developing shortness of breath at rest with cough nasal congestion loose stools and generalized weakness.  In the ED was found tachypneic hypoxic placed on 2 L of oxygen SARS-CoV-2 PCR was positive tropes 25 D-dimer 1.6 BNP of 425 creatinine at baseline chest x-ray showed bilateral diffuse groundglass opacities asymmetric.  Assessment/Plan:   Acute respiratory failure with hypoxia secondary to Pneumonia due to COVID-19 virus On admission he was placed on 2 L nasal cannula is been afebrile with no leukocytosis. He was started empirically on IV remdesivir and steroids.  Inflammatory markers CRP are and ferritin are improving, his D-dimer is greater than 20. His procalcitonin on admission was low yield, INR is 2.5 this morning is 3.6. Try to keep the patient properly 16 hours a day, not prone out of bed to chair continue incentive spirometry and flutter valve. At this point in time he has refused baricitinib and Actemra he would like to speak to his wife and his cardiologist about it before receiving it. Is been 9 days since his symptoms started.  Acute on chronic systolic heart failure: He relates not being compliant with his diuretic due to obesity travel schedule.  His BNP was 429 on admission with a chest x-ray probably showing some edema. He was given IV Lasix and resuming his digoxin statin metoprolol and losartan. I's and O's have been poorly recorded in the ED according to chart he is +250 cc that is all that is documented I's and  O's. Blood pressure is improving, creatinine is slowly going down with diuresis we will continue home dose of Lasix.  Permanent atrial fibrillation: On Coumadin INR 3.6 continue metoprolol.  Coumadin per pharmacy.  Essential hypertension: Continue losartan metoprolol and Lasix. Blood pressure slightly elevated we will continue to monitor closely.  Chronic kidney disease stage IIIa: Appears to be at baseline.  Coronary artery disease status post PCI: Continue statins, metoprolol, losartan and aspirin.  Hyperlipidemia: Continue fenofibrate and statins.  Small amount of black stool: Patient is on coumaidn, Hbg appears to be at baseline. Colonoscopy in 08/31/2016 showed nonthrombosed external hemorrhoids on the perianal area, 5 mm polyp in the transverse colon which was removed diverticulosis and internal hemorrhoids. Probably the site of bleeding if there is actual bleeding with the hemorrhoid Hemoccult is pending.   DVT prophylaxis: coumadin Family Communication:wife Status is: Inpatient  Remains inpatient appropriate because:Persistent severe electrolyte disturbances   Dispo: The patient is from: Home              Anticipated d/c is to: Home              Anticipated d/c date is: > 3 days              Patient currently is not medically stable to d/c.        Code Status:     Code Status Orders  (From admission, onward)         Start     Ordered   08/26/20 1045  Full code  Continuous  08/26/20 1046        Code Status History    Date Active Date Inactive Code Status Order ID Comments User Context   04/24/2020 1531 04/24/2020 2124 Full Code [id number]  [name], MD Inpatient   09/30/2017 0116 10/02/2017 1531 Full Code [id number]  [name], MD ED   04/10/2017 1748 04/14/2017 1428 Full Code [id number]  [name], MD Inpatient   01/25/2017 1411 01/25/2017 2220 Full Code [id number]  [name], MD Inpatient   03/02/2016 1628 03/16/2016 2023 Full  Code [id number]  [name] Inpatient   02/18/2016 1408 03/02/2016 1613 Full Code [id number]  [name], MD ED   Advance Care Planning Activity        IV Access:    Peripheral IV   Procedures and diagnostic studies:   DG Chest 2 View  Result Date: 08/25/2020 CLINICAL DATA:  Shortness of breath EXAM: CHEST - 2 VIEW COMPARISON:  04/30/2018 FINDINGS: Right-sided multi lead pacing device. Mild cardiomegaly. No pleural effusion. Diffuse left interstitial and ground-glass opacity, asymmetric to the right. Aortic atherosclerosis. No pneumothorax. IMPRESSION: Diffuse left interstitial and ground-glass opacity, asymmetric to the right, potentially due to diffuse pneumonia versus asymmetric edema. Electronically Signed   By: Donavan Foil M.D.   On: 08/25/2020 19:05     Medical Consultants:    None.  Anti-Infectives:   none  Subjective:    [name] he relates his breathing is unchanged compared to yesterday.  She continues to be short of breath  Objective:    Vitals:   08/27/20 0315 08/27/20 0342 08/27/20 0400 08/27/20 0415  BP: 127/69 127/69 137/71 (!) 141/70  Pulse: 73 70 70 70  Resp: (!) 21 (!) 21 (!) 24 (!) 31  Temp:  98.1 F (36.7 C)    TempSrc:  Oral    SpO2: 96% 91% 96% 98%  Weight:      Height:       SpO2: 98 % O2 Flow Rate (L/min): (S) 8 L/min   Intake/Output Summary (Last 24 hours) at 08/27/2020 0707 Last data filed at 08/26/2020 1548 Gross per 24 hour  Intake 250 ml  Output --  Net 250 ml   Filed Weights   08/26/20 0653  Weight: 96 kg    Exam: General exam: In no acute distress. Respiratory system: Good air movement with diffuse crackles mostly on the left Cardiovascular system: S1 & S2 heard, RRR. No JVD. Gastrointestinal system: Abdomen is nondistended, soft and nontender.  Extremities: No pedal edema. Skin: No rashes, lesions or ulcers  Data Reviewed:    Labs: Basic Metabolic Panel: Recent Labs  Lab 08/25/20 1813  08/25/20 1813 08/26/20 1200 08/27/20 0454  NA 142  --  142 141  K 4.1   < > 4.3 4.2  CL 107  --  105 104  CO2 24  --  23 23  GLUCOSE 109*  --  95 153*  BUN 31*  --  26* 28*  CREATININE 1.54*  --  1.40* 1.30*  CALCIUM 9.9  --  9.9 9.3  MG  --   --   --  1.8  PHOS  --   --   --  4.2   < > = values in this interval not displayed.   GFR Estimated Creatinine Clearance: 59.7 mL/min (A) (by C-G formula based on SCr of 1.3 mg/dL (H)). Liver Function Tests: Recent Labs  Lab 08/26/20 1200 08/27/20 0454  AST 37 31  ALT 23 22  ALKPHOS 42 46  BILITOT 1.0 0.4  PROT 6.9 6.5  ALBUMIN 2.8* 2.6*   No results for input(s): LIPASE, AMYLASE in the last 168 hours. No results for input(s): AMMONIA in the last 168 hours. Coagulation profile Recent Labs  Lab 08/26/20 0826 08/27/20 0454  INR 2.5* 3.6*   COVID-19 Labs  Recent Labs    08/26/20 0826 08/26/20 1200 08/27/20 0454  DDIMER 1.67*  --  >20.00*  FERRITIN  --  1,798* 1,724*  LDH  --  278*  --   CRP  --  11.3* 9.5*    Lab Results  Component Value Date   SARSCOV2NAA POSITIVE (A) 08/26/2020   [place] NEGATIVE 04/21/2020    CBC: Recent Labs  Lab 08/25/20 1813 08/27/20 0221 08/27/20 0454  WBC 7.9  --  7.6  NEUTROABS  --   --  PENDING  HGB 10.7* 11.6* 10.5*  HCT 35.0* 36.8* 34.6*  MCV 83.1  --  82.8  PLT 332  --  370   Cardiac Enzymes: No results for input(s): CKTOTAL, CKMB, CKMBINDEX, TROPONINI in the last 168 hours. BNP (last 3 results) No results for input(s): PROBNP in the last 8760 hours. CBG: No results for input(s): GLUCAP in the last 168 hours. D-Dimer: Recent Labs    08/26/20 0826 08/27/20 0454  DDIMER 1.67* >20.00*   Hgb A1c: No results for input(s): HGBA1C in the last 72 hours. Lipid Profile: Recent Labs    08/26/20 1200  TRIG 139   Thyroid function studies: No results for input(s): TSH, T4TOTAL, T3FREE, THYROIDAB in the last 72 hours.  Invalid input(s): FREET3 Anemia work  up: Recent Labs    08/26/20 1200 08/27/20 0454  FERRITIN 1,798* 1,724*   Sepsis Labs: Recent Labs  Lab 08/25/20 1813 08/26/20 1135 08/26/20 1200 08/27/20 0454  PROCALCITON  --   --  0.12  --   WBC 7.9  --   --  7.6  LATICACIDVEN  --  1.4  --   --    Microbiology Recent Results (from the past 240 hour(s))  SARS Coronavirus 2 by RT PCR (hospital order, performed in [facility] hospital lab) Nasopharyngeal Nasopharyngeal Swab     Status: Abnormal   Collection Time: 08/26/20  6:55 AM   Specimen: Nasopharyngeal Swab  Result Value Ref Range Status   SARS Coronavirus 2 POSITIVE (A) NEGATIVE Final    Comment: emailed L. Berdik RN 10:00 08/26/20 ([email or web address]) (NOTE) SARS-CoV-2 target nucleic acids are DETECTED  SARS-CoV-2 RNA is generally detectable in upper respiratory specimens  during the acute phase of infection.  Positive results are indicative  of the presence of the identified virus, but do not rule out bacterial infection or co-infection with other pathogens not detected by the test.  Clinical correlation with patient history and  other diagnostic information is necessary to determine patient infection status.  The expected result is negative.  Fact Sheet for Patients:   StrictlyIdeas.no   Fact Sheet for Healthcare Providers:   BankingDealers.co.za    This test is not yet approved or cleared by the [place] FDA and  has been authorized for detection and/or diagnosis of SARS-CoV-2 by FDA under an Emergency Use Authorization (EUA).  This EUA will remain in effect (meaning this test can be used) for the duration of  the  COVID-19 declaration under Section 564(b)(1) of the Act, 21 U.S.C. section 360-bbb-3(b)(1), unless the authorization is terminated or revoked sooner.  Performed at [facility], [place] [address] [place], [place]  [address]      Medications:   . albuterol  2 puff Inhalation Q6H  . vitamin C  500  mg Oral Daily  . digoxin  0.125 mg Oral Daily  . fenofibrate  160 mg Oral Daily  . ferrous sulfate  325 mg Oral BID  . folic acid  1 mg Oral QPM  . furosemide  40 mg Oral Q24H  . furosemide  80 mg Oral Q24H  . losartan  25 mg Oral Daily  . methylPREDNISolone (SOLU-MEDROL) injection  1 mg/kg Intravenous Q12H   Followed by  . [START ON 08/29/2020] predniSONE  50 mg Oral Daily  . metoprolol succinate  25 mg Oral Daily  . pantoprazole  40 mg Oral BID  . pravastatin  40 mg Oral Daily  . sertraline  150 mg Oral Q1400  . Warfarin - Pharmacist Dosing Inpatient   Does not apply q1600  . zinc sulfate  220 mg Oral Daily   Continuous Infusions: . remdesivir 100 mg in NS 100 mL        LOS: 1 day   Charlynne Cousins  [facility]  08/27/2020, 7:07 AM

## 2020-08-27 NOTE — ED Notes (Signed)
Pt 02 sat=80% on 6LNC, RT called, pt placed on high flow [place] at 8L. Pt o2 sat=90%.

## 2020-08-28 ENCOUNTER — Telehealth: Payer: Self-pay | Admitting: Cardiology

## 2020-08-28 ENCOUNTER — Telehealth: Payer: Self-pay

## 2020-08-28 LAB — COMPREHENSIVE METABOLIC PANEL
ALT: 21 U/L (ref 0–44)
AST: 28 U/L (ref 15–41)
Albumin: 2.9 g/dL — ABNORMAL LOW (ref 3.5–5.0)
Alkaline Phosphatase: 48 U/L (ref 38–126)
Anion gap: 15 (ref 5–15)
BUN: 32 mg/dL — ABNORMAL HIGH (ref 8–23)
CO2: 27 mmol/L (ref 22–32)
Calcium: 9.4 mg/dL (ref 8.9–10.3)
Chloride: 99 mmol/L (ref 98–111)
Creatinine, Ser: 1.27 mg/dL — ABNORMAL HIGH (ref 0.61–1.24)
GFR calc Af Amer: >60 mL/min (ref >60)
GFR calc non Af Amer: 56 mL/min — ABNORMAL LOW (ref >60)
Glucose, Bld: 151 mg/dL — ABNORMAL HIGH (ref 70–99)
Potassium: 3.7 mmol/L (ref 3.5–5.1)
Sodium: 141 mmol/L (ref 135–145)
Total Bilirubin: 0.6 mg/dL (ref 0.3–1.2)
Total Protein: 6.7 g/dL (ref 6.5–8.1)

## 2020-08-28 LAB — CBC WITH DIFFERENTIAL/PLATELET
Abs Immature Granulocytes: 0.11 10*3/uL — ABNORMAL HIGH (ref 0.00–0.07)
Basophils Absolute: 0 10*3/uL (ref 0.0–0.1)
Basophils Relative: 0 %
Eosinophils Absolute: 0 10*3/uL (ref 0.0–0.5)
Eosinophils Relative: 0 %
HCT: 38.4 % — ABNORMAL LOW (ref 39.0–52.0)
Hemoglobin: 12.2 g/dL — ABNORMAL LOW (ref 13.0–17.0)
Immature Granulocytes: 1 %
Lymphocytes Relative: 5 %
Lymphs Abs: 0.8 10*3/uL (ref 0.7–4.0)
MCH: 26 pg (ref 26.0–34.0)
MCHC: 31.8 g/dL (ref 30.0–36.0)
MCV: 81.9 fL (ref 80.0–100.0)
Monocytes Absolute: 0.6 10*3/uL (ref 0.1–1.0)
Monocytes Relative: 3 %
Neutro Abs: 14.6 10*3/uL — ABNORMAL HIGH (ref 1.7–7.7)
Neutrophils Relative %: 91 %
Platelets: 402 10*3/uL — ABNORMAL HIGH (ref 150–400)
RBC: 4.69 MIL/uL (ref 4.22–5.81)
RDW: 15.6 % — ABNORMAL HIGH (ref 11.5–15.5)
WBC: 16.1 10*3/uL — ABNORMAL HIGH (ref 4.0–10.5)
nRBC: 0 % (ref 0.0–0.2)

## 2020-08-28 LAB — MAGNESIUM: Magnesium: 2 mg/dL (ref 1.7–2.4)

## 2020-08-28 LAB — C-REACTIVE PROTEIN: CRP: 4.5 mg/dL — ABNORMAL HIGH (ref <1.0)

## 2020-08-28 LAB — PROTIME-INR
INR: 4.9 (ref 0.8–1.2)
Prothrombin Time: 44.2 seconds — ABNORMAL HIGH (ref 11.4–15.2)

## 2020-08-28 LAB — D-DIMER, QUANTITATIVE: D-Dimer, Quant: >20 ug/mL-FEU — ABNORMAL HIGH (ref 0.00–0.50)

## 2020-08-28 LAB — FERRITIN: Ferritin: 1612 ng/mL — ABNORMAL HIGH (ref 24–336)

## 2020-08-28 LAB — PHOSPHORUS: Phosphorus: 4.1 mg/dL (ref 2.5–4.6)

## 2020-08-28 NOTE — Telephone Encounter (Signed)
Returned call to wife as requested per voice mail message.  She asked if Dr Martinique could go see patient in the ER.  She explained she has COVID and so does the patient but it is difficult to communicate with the ER.  She is concerned about a medication they want to give the patient and would like Dr Doug Sou opinion.  Advised to call Dr Doug Sou office to discuss patient's condition and if the cardiologist will be consulting with him in the ER.

## 2020-08-28 NOTE — Telephone Encounter (Signed)
Spoke to patient's wife.She is upset husband was admitted to [facility] hospital this past Tuesday with covid.Stated she wants to know husband's condition and no one will call her back. Spoke to patient's RN Ava she stated patient is stable.Stated he ate lunch and is feeling better this afternoon.Stated she will call wife this afternoon.

## 2020-08-28 NOTE — Progress Notes (Signed)
Spoke to pt.'s wife [name] and updated her about pt.'s condition & answered all her questions

## 2020-08-28 NOTE — Progress Notes (Signed)
[place] for warfarin Indication: atrial fibrillation  Allergies  Allergen Reactions  . Keflex [Cephalexin] Shortness Of Breath and Other (See Comments)    dizziness    Patient Measurements: Height: 5\' 10"  (177.8 cm) Weight: 96 kg (211 lb 10.3 oz) IBW/kg (Calculated) : 73   Vital Signs: BP: 128/58 (09/10 0740) Pulse Rate: 69 (09/10 0740)  Labs: Recent Labs    08/25/20 1813 08/25/20 1813 08/26/20 0826 08/26/20 1200 08/27/20 0221 08/27/20 0454  HGB 10.7*   < >  --   --  11.6* 10.5*  HCT 35.0*  --   --   --  36.8* 34.6*  PLT 332  --   --   --   --  370  LABPROT  --   --  26.3*  --   --  34.6*  INR  --   --  2.5*  --   --  3.6*  CREATININE 1.54*  --   --  1.40*  --  1.30*  TROPONINIHS  --   --  25*  --   --   --    < > = values in this interval not displayed.    Estimated Creatinine Clearance: 59.7 mL/min (A) (by C-G formula based on SCr of 1.3 mg/dL (H)).   Medical History: Past Medical History:  Diagnosis Date  . AICD (automatic cardioverter/defibrillator) present   . Anemia   . Anxiety   . CHF (congestive heart failure) ([place])   . Chronic renal insufficiency   . Coronary artery disease   . Depression   . Dyslipidemia   . Dysrhythmia    atrial fibrillation  . ED (erectile dysfunction)   . GERD (gastroesophageal reflux disease)   . Incisional hernia, without obstruction or gangrene 04/10/2017  . Ischemic cardiomyopathy    EF 23%  . MI (myocardial infarction) ([place]) 2000   ANTERIOR, s/p PCI  . Other primary cardiomyopathies   . Peripheral vascular disease ([place])    dvt legs 2017  . Persistent atrial fibrillation ([facility])    on coumadin  . Presence of permanent cardiac pacemaker   . Shortness of breath dyspnea    on exertion   Assessment: 67 YOM presenting with SOB COVID +, hx of afib on warfarin PTA.  INR therapeutic on admission at 2.5, last dose taken 9/6  PTA dosing: 3mg  daily  INR today remains  SUPRAtherapeutic and rising despite holding yesterday's dose (INR 4.9 << 3.6, goal of 2-3). CBC stable, no bleeding noted at this time.   Goal of Therapy:  INR 2-3 Monitor platelets by anticoagulation protocol: Yes   Plan:  - Hold warfarin again today - Daily PT/INR, CBC q72h - Will continue to monitor for any signs/symptoms of bleeding and will follow up with PT/INR in the a.m.    Thank you for allowing pharmacy to be a part of this patient's care.  Alycia Rossetti, PharmD, BCPS Clinical Pharmacist Clinical phone for 08/28/2020: [phone] 08/28/2020 11:11 AM   **Pharmacist phone directory can now be found on [email or web address] ([email or web address]).  Listed under [place].

## 2020-08-28 NOTE — Progress Notes (Addendum)
[facility] PROGRESS NOTE    Progress Note  [name]  WSF:[id number] DOB: 07/07/1946 DOA: 08/25/2020 PCP: [name], MD     Brief Narrative:   [name] is an 74 y.o. male past medical history significant for nonischemic cardiomyopathy with an EF of 25% status post AICD, permanent atrial fibrillation/atrial flutter on Coumadin who is fully vaccinated in March went [place] an [place] several days prior to admission started developing shortness of breath at rest with cough nasal congestion loose stools and generalized weakness.  In the ED was found tachypneic hypoxic placed on 2 L of oxygen SARS-CoV-2 PCR was positive tropes 25 D-dimer 1.6 BNP of 425 creatinine at baseline chest x-ray showed bilateral diffuse groundglass opacities asymmetric.  Assessment/Plan:   Acute respiratory failure with hypoxia secondary to Pneumonia due to COVID-19 virus He is currently requiring 8 L of oxygen to keep saturations greater than 90%. Continue IV remdesivir and steroids. His inflammatory markers are pending this morning but he is requiring more oxygen than yesterday. I offered the patient today again baricitinib or Actemra and he has refused, as he relates he was not well informed and he will want me to discuss it with his wife. I did call the wife and she was belligerent and screaming, I told her I understood she was frustrated and that she needed to come down. Only for non-Covid patients 1 When I restarted to explain to her that his oxygen requirements were going up she lost control, I kept trying to tell her that we could have a conversation with respect but she called me" a fucking idiot", I calmly told her that she needed to calm down and that I will call her later. His INR is therapeutic.  Acute on chronic systolic heart failure: He relates not being compliant with his diuretic due to busy travel schedule.  His BNP was 429 on admission with a chest x-ray probably  showing some edema. He was started back on his home dose Lasix he is negative about a liter and a half. His blood pressure is stable today we will continue current dose of Lasix, metoprolol, losartan and digoxin.  Permanent atrial fibrillation: INR therapeutic rate controlled continue Coumadin per pharmacy. On Coumadin INR 3.6 continue metoprolol.  Coumadin per pharmacy.  Essential hypertension: Continue losartan metoprolol and Lasix. Blood pressure seems to be well controlled continue current regimen.  Chronic kidney disease stage IIIa: Appears to be at baseline.  Coronary artery disease status post PCI: Continue statins, metoprolol, losartan and aspirin.  Hyperlipidemia: Continue fenofibrate and statins.  Small amount of black stool: His blood pressure likely due to hemorrhoids, he had a colonoscopy 08/31/2016 that showed thrombosed external hemorrhoids, no further signs of bleeding his hemoglobin has remained stable.  Acute confusional state: Our conversation this morning was a little bit off he still awake alert and oriented x3. But I asked him to let me see his tongue to see if he had oral thrush and his response was that I should show him my tongue first. He is on Haldol IV as needed we will continue to follow and monitor his mentation closely.   DVT prophylaxis: coumadin Family Communication:wife Status is: Inpatient  Remains inpatient appropriate because:Persistent severe electrolyte disturbances   Dispo: The patient is from: Home              Anticipated d/c is to: Home              Anticipated d/c  date is: > 3 days              Patient currently is not medically stable to d/c.  Code Status:     Code Status Orders  (From admission, onward)         Start     Ordered   08/26/20 1045  Full code  Continuous        08/26/20 1046        Code Status History    Date Active Date Inactive Code Status Order ID Comments User Context   04/24/2020 1531 04/24/2020 2124  Full Code [id number]  [name], MD Inpatient   09/30/2017 0116 10/02/2017 1531 Full Code [id number]  [name], MD ED   04/10/2017 1748 04/14/2017 1428 Full Code [id number]  [name], MD Inpatient   01/25/2017 1411 01/25/2017 2220 Full Code [id number]  [name], MD Inpatient   03/02/2016 1628 03/16/2016 2023 Full Code [id number]  [name] Inpatient   02/18/2016 1408 03/02/2016 1613 Full Code [id number]  [name], MD ED   Advance Care Planning Activity        IV Access:    Peripheral IV   Procedures and diagnostic studies:   No results found.   Medical Consultants:    None.  Anti-Infectives:   none  Subjective:    [name] this morning he relates his breathing feels about the same, he relates he is thirsty.  Objective:    Vitals:   08/28/20 0100 08/28/20 0200 08/28/20 0400 08/28/20 0740  BP: 118/61 137/60 129/61 (!) 128/58  Pulse: 70 70 69 69  Resp: 19 (!) 22 17 (!) 21  Temp:      TempSrc:      SpO2: 92% 92% 90% 98%  Weight:      Height:       SpO2: 98 % O2 Flow Rate (L/min): 8 L/min   Intake/Output Summary (Last 24 hours) at 08/28/2020 0750 Last data filed at 08/27/2020 2100 Gross per 24 hour  Intake 100 ml  Output 1800 ml  Net -1700 ml   Filed Weights   08/26/20 0653  Weight: 96 kg    Exam: General exam: In no acute distress. Respiratory system: Good air movement and clear to auscultation. Cardiovascular system: S1 & S2 heard, RRR. No JVD. Gastrointestinal system: Abdomen is nondistended, soft and nontender.  Extremities: No pedal edema. Skin: No rashes, lesions or ulcers  Data Reviewed:    Labs: Basic Metabolic Panel: Recent Labs  Lab 08/25/20 1813 08/25/20 1813 08/26/20 1200 08/27/20 0454  NA 142  --  142 141  K 4.1   < > 4.3 4.2  CL 107  --  105 104  CO2 24  --  23 23  GLUCOSE 109*  --  95 153*  BUN 31*  --  26* 28*  CREATININE 1.54*  --  1.40* 1.30*  CALCIUM 9.9  --  9.9 9.3  MG  --   --    --  1.8  PHOS  --   --   --  4.2   < > = values in this interval not displayed.   GFR Estimated Creatinine Clearance: 59.7 mL/min (A) (by C-G formula based on SCr of 1.3 mg/dL (H)). Liver Function Tests: Recent Labs  Lab 08/26/20 1200 08/27/20 0454  AST 37 31  ALT 23 22  ALKPHOS 42 46  BILITOT 1.0 0.4  PROT 6.9 6.5  ALBUMIN 2.8* 2.6*   No  results for input(s): LIPASE, AMYLASE in the last 168 hours. No results for input(s): AMMONIA in the last 168 hours. Coagulation profile Recent Labs  Lab 08/26/20 0826 08/27/20 0454  INR 2.5* 3.6*   COVID-19 Labs  Recent Labs    08/26/20 0826 08/26/20 1200 08/27/20 0454  DDIMER 1.67*  --  >20.00*  FERRITIN  --  1,798* 1,724*  LDH  --  278*  --   CRP  --  11.3* 9.5*    Lab Results  Component Value Date   SARSCOV2NAA POSITIVE (A) 08/26/2020   [place] NEGATIVE 04/21/2020    CBC: Recent Labs  Lab 08/25/20 1813 08/27/20 0221 08/27/20 0454  WBC 7.9  --  7.6  NEUTROABS  --   --  7.0  HGB 10.7* 11.6* 10.5*  HCT 35.0* 36.8* 34.6*  MCV 83.1  --  82.8  PLT 332  --  370   Cardiac Enzymes: No results for input(s): CKTOTAL, CKMB, CKMBINDEX, TROPONINI in the last 168 hours. BNP (last 3 results) No results for input(s): PROBNP in the last 8760 hours. CBG: Recent Labs  Lab 08/27/20 1646  GLUCAP 172*   D-Dimer: Recent Labs    08/26/20 0826 08/27/20 0454  DDIMER 1.67* >20.00*   Hgb A1c: No results for input(s): HGBA1C in the last 72 hours. Lipid Profile: Recent Labs    08/26/20 1200  TRIG 139   Thyroid function studies: No results for input(s): TSH, T4TOTAL, T3FREE, THYROIDAB in the last 72 hours.  Invalid input(s): FREET3 Anemia work up: Recent Labs    08/26/20 1200 08/27/20 0454  FERRITIN 1,798* 1,724*   Sepsis Labs: Recent Labs  Lab 08/25/20 1813 08/26/20 1135 08/26/20 1200 08/27/20 0454  PROCALCITON  --   --  0.12  --   WBC 7.9  --   --  7.6  LATICACIDVEN  --  1.4  --   --     Microbiology Recent Results (from the past 240 hour(s))  SARS Coronavirus 2 by RT PCR (hospital order, performed in [facility] hospital lab) Nasopharyngeal Nasopharyngeal Swab     Status: Abnormal   Collection Time: 08/26/20  6:55 AM   Specimen: Nasopharyngeal Swab  Result Value Ref Range Status   SARS Coronavirus 2 POSITIVE (A) NEGATIVE Final    Comment: emailed L. Berdik RN 10:00 08/26/20 ([email or web address]) (NOTE) SARS-CoV-2 target nucleic acids are DETECTED  SARS-CoV-2 RNA is generally detectable in upper respiratory specimens  during the acute phase of infection.  Positive results are indicative  of the presence of the identified virus, but do not rule out bacterial infection or co-infection with other pathogens not detected by the test.  Clinical correlation with patient history and  other diagnostic information is necessary to determine patient infection status.  The expected result is negative.  Fact Sheet for Patients:   StrictlyIdeas.no   Fact Sheet for Healthcare Providers:   BankingDealers.co.za    This test is not yet approved or cleared by the [place] FDA and  has been authorized for detection and/or diagnosis of SARS-CoV-2 by FDA under an Emergency Use Authorization (EUA).  This EUA will remain in effect (meaning this test can be used) for the duration of  the  COVID-19 declaration under Section 564(b)(1) of the Act, 21 U.S.C. section 360-bbb-3(b)(1), unless the authorization is terminated or revoked sooner.  Performed at [facility], [place] [address] [place], [place] [address]   Blood Culture (routine x 2)     Status: None (Preliminary result)  Collection Time: 08/26/20 11:36 AM   Specimen: BLOOD  Result Value Ref Range Status   Specimen Description BLOOD RIGHT ANTECUBITAL  Final   Special Requests   Final    BOTTLES DRAWN AEROBIC AND ANAEROBIC Blood Culture adequate volume   Culture   Final    NO GROWTH <  24 HOURS Performed at [facility], [address] [place], [place] [address]    Report Status PENDING  Incomplete  Blood Culture (routine x 2)     Status: None (Preliminary result)   Collection Time: 08/27/20  4:56 AM   Specimen: BLOOD LEFT FOREARM  Result Value Ref Range Status   Specimen Description BLOOD LEFT FOREARM  Final   Special Requests   Final    BOTTLES DRAWN AEROBIC AND ANAEROBIC Blood Culture results may not be optimal due to an inadequate volume of blood received in culture bottles Performed at [facility] [address] [place], [place] [address]    Culture PENDING  Incomplete   Report Status PENDING  Incomplete     Medications:   . albuterol  2 puff Inhalation Q6H  . vitamin C  500 mg Oral Daily  . digoxin  0.125 mg Oral Daily  . fenofibrate  160 mg Oral Daily  . ferrous sulfate  325 mg Oral BID  . folic acid  1 mg Oral QPM  . furosemide  40 mg Oral Q24H  . furosemide  80 mg Oral Q24H  . losartan  25 mg Oral Daily  . methylPREDNISolone (SOLU-MEDROL) injection  80 mg Intravenous Q12H  . metoprolol succinate  25 mg Oral Daily  . pantoprazole  40 mg Oral BID  . pravastatin  40 mg Oral Daily  . sertraline  150 mg Oral Q1400  . Warfarin - Pharmacist Dosing Inpatient   Does not apply q1600  . zinc sulfate  220 mg Oral Daily   Continuous Infusions: . remdesivir 100 mg in NS 100 mL Stopped (08/27/20 1150)      LOS: 2 days   Charlynne Cousins  [facility]  08/28/2020, 7:50 AM

## 2020-08-28 NOTE — Plan of Care (Signed)

## 2020-08-28 NOTE — ED Notes (Signed)
Pt asleep in bed, respirations are even and noon-labored  Does not appear in distress, skin is warm dry and intact

## 2020-08-28 NOTE — ED Notes (Signed)
Wife, Santiago Glad would like an update [phone]

## 2020-08-28 NOTE — Telephone Encounter (Signed)
    Pt would like to speak with Dr. Doug Sou nurse. She said she really need to speak with someone about pt. Both of them got covid and pt is worst. Also, please refer phone note from [place] short today.

## 2020-08-29 LAB — CBC WITH DIFFERENTIAL/PLATELET
Abs Immature Granulocytes: 0.35 10*3/uL — ABNORMAL HIGH (ref 0.00–0.07)
Basophils Absolute: 0 10*3/uL (ref 0.0–0.1)
Basophils Relative: 0 %
Eosinophils Absolute: 0 10*3/uL (ref 0.0–0.5)
Eosinophils Relative: 0 %
HCT: 36.1 % — ABNORMAL LOW (ref 39.0–52.0)
Hemoglobin: 11.3 g/dL — ABNORMAL LOW (ref 13.0–17.0)
Immature Granulocytes: 2 %
Lymphocytes Relative: 3 %
Lymphs Abs: 0.6 10*3/uL — ABNORMAL LOW (ref 0.7–4.0)
MCH: 25.3 pg — ABNORMAL LOW (ref 26.0–34.0)
MCHC: 31.3 g/dL (ref 30.0–36.0)
MCV: 80.8 fL (ref 80.0–100.0)
Monocytes Absolute: 0.7 10*3/uL (ref 0.1–1.0)
Monocytes Relative: 4 %
Neutro Abs: 15.5 10*3/uL — ABNORMAL HIGH (ref 1.7–7.7)
Neutrophils Relative %: 91 %
Platelets: 376 10*3/uL (ref 150–400)
RBC: 4.47 MIL/uL (ref 4.22–5.81)
RDW: 15.4 % (ref 11.5–15.5)
WBC: 17.2 10*3/uL — ABNORMAL HIGH (ref 4.0–10.5)
nRBC: 0 % (ref 0.0–0.2)

## 2020-08-29 LAB — COMPREHENSIVE METABOLIC PANEL WITH GFR
ALT: 18 U/L (ref 0–44)
AST: 25 U/L (ref 15–41)
Albumin: 2.5 g/dL — ABNORMAL LOW (ref 3.5–5.0)
Alkaline Phosphatase: 52 U/L (ref 38–126)
Anion gap: 15 (ref 5–15)
BUN: 38 mg/dL — ABNORMAL HIGH (ref 8–23)
CO2: 27 mmol/L (ref 22–32)
Calcium: 9.1 mg/dL (ref 8.9–10.3)
Chloride: 99 mmol/L (ref 98–111)
Creatinine, Ser: 1.43 mg/dL — ABNORMAL HIGH (ref 0.61–1.24)
GFR calc Af Amer: 56 mL/min — ABNORMAL LOW (ref >60)
GFR calc non Af Amer: 49 mL/min — ABNORMAL LOW (ref >60)
Glucose, Bld: 131 mg/dL — ABNORMAL HIGH (ref 70–99)
Potassium: 3.5 mmol/L (ref 3.5–5.1)
Sodium: 141 mmol/L (ref 135–145)
Total Bilirubin: 0.8 mg/dL (ref 0.3–1.2)
Total Protein: 6.3 g/dL — ABNORMAL LOW (ref 6.5–8.1)

## 2020-08-29 LAB — MAGNESIUM: Magnesium: 1.9 mg/dL (ref 1.7–2.4)

## 2020-08-29 LAB — PROTIME-INR
INR: 4.3 (ref 0.8–1.2)
Prothrombin Time: 39.9 s — ABNORMAL HIGH (ref 11.4–15.2)

## 2020-08-29 LAB — D-DIMER, QUANTITATIVE: D-Dimer, Quant: 13.99 ug{FEU}/mL — ABNORMAL HIGH (ref 0.00–0.50)

## 2020-08-29 LAB — FERRITIN: Ferritin: 1582 ng/mL — ABNORMAL HIGH (ref 24–336)

## 2020-08-29 LAB — C-REACTIVE PROTEIN: CRP: 4.2 mg/dL — ABNORMAL HIGH (ref <1.0)

## 2020-08-29 LAB — PHOSPHORUS: Phosphorus: 3.8 mg/dL (ref 2.5–4.6)

## 2020-08-29 NOTE — Progress Notes (Signed)
[facility] PROGRESS NOTE    Progress Note  [name]  OZD:[id number] DOB: 1946-01-22 DOA: 08/25/2020 PCP: [name], MD     Brief Narrative:   [name] is an 74 y.o. male past medical history significant for nonischemic cardiomyopathy with an EF of 25% status post AICD, permanent atrial fibrillation/atrial flutter on Coumadin who is fully vaccinated in March went to an out-of-state reunion several days prior to admission started developing shortness of breath at rest with cough nasal congestion loose stools and generalized weakness.  In the ED was found tachypneic hypoxic placed on 2 L of oxygen SARS-CoV-2 PCR was positive tropes 25 D-dimer 1.6 BNP of 425 creatinine at baseline chest x-ray showed bilateral diffuse groundglass opacities asymmetric.  Assessment/Plan:   Acute respiratory failure with hypoxia secondary to Pneumonia due to COVID-19 virus He is currently requiring 8 L of oxygen to keep saturations greater than 90%. Continue IV remdesivir and steroids. His inflammatory markers are improved, except for his D-dimer is greater than 20. His INR is supratherapeutic. He developed a little bit of delirium overnight. I did give an update to his wife this morning she was more calm and communicative.  Acute on chronic systolic heart failure: His fluid has been limited he was started back on his home dose of Lasix. Any seems to be diuresing, he is negative about 2 L. We will continue his current home of Lasix will limit his fluid intake. His blood pressure is stable and his creatinine is improving. Continue Lasix metoprolol losartan and digoxin.  Permanent atrial fibrillation: INR therapeutic rate controlled continue Coumadin per pharmacy. On Coumadin INR 3.6 continue metoprolol.  Coumadin per pharmacy.  Essential hypertension: Continue losartan metoprolol and Lasix. Blood pressure seems to be well controlled continue current regimen.  Chronic kidney  disease stage IIIa: Appears to be at baseline.  Coronary artery disease status post PCI: Continue statins, metoprolol, losartan and aspirin.  Hyperlipidemia: Continue fenofibrate and statins.  Small amount of black stool: His blood pressure likely due to hemorrhoids, he had a colonoscopy 08/31/2016 that showed thrombosed external hemorrhoids, no further signs of bleeding his hemoglobin has remained stable.  Acute confusional state: Continue Haldol IV as needed.   DVT prophylaxis: coumadin Family Communication:wife Status is: Inpatient  Remains inpatient appropriate because:Persistent severe electrolyte disturbances   Dispo: The patient is from: Home              Anticipated d/c is to: Home              Anticipated d/c date is: > 3 days              Patient currently is not medically stable to d/c.  Code Status:     Code Status Orders  (From admission, onward)         Start     Ordered   08/26/20 1045  Full code  Continuous        08/26/20 1046        Code Status History    Date Active Date Inactive Code Status Order ID Comments User Context   04/24/2020 1531 04/24/2020 2124 Full Code [id number]  [name], MD Inpatient   09/30/2017 0116 10/02/2017 1531 Full Code [id number]  [name], MD ED   04/10/2017 1748 04/14/2017 1428 Full Code [id number]  [name], MD Inpatient   01/25/2017 1411 01/25/2017 2220 Full Code [id number]  [name], MD Inpatient   03/02/2016 1628 03/16/2016 2023  Full Code [id number]  [name] Inpatient   02/18/2016 1408 03/02/2016 1613 Full Code [id number]  [name], MD ED   Advance Care Planning Activity        IV Access:    Peripheral IV   Procedures and diagnostic studies:   No results found.   Medical Consultants:    None.  Anti-Infectives:   none  Subjective:    CLAYVON PARLETT relates his breathing is about the same compared to yesterday.  Objective:    Vitals:   08/28/20 1743 08/28/20 2139  08/29/20 0133 08/29/20 0647  BP: (!) 121/56 (!) 101/55 (!) 115/56 123/64  Pulse: 70 70 70 70  Resp: 18 19 20 20   Temp: (!) 97.4 F (36.3 C) 98.5 F (36.9 C) 97.7 F (36.5 C) (!) 97.5 F (36.4 C)  TempSrc: Oral Oral Oral Oral  SpO2: 91% (!) 85%  (!) 88%  Weight:      Height:       SpO2: (!) 88 % O2 Flow Rate (L/min): 8 L/min   Intake/Output Summary (Last 24 hours) at 08/29/2020 0729 Last data filed at 08/29/2020 0130 Gross per 24 hour  Intake 730 ml  Output 600 ml  Net 130 ml   Filed Weights   08/26/20 0653  Weight: 96 kg    Exam: General exam: In no acute distress. Respiratory system: Good air movement and clear to auscultation. Cardiovascular system: S1 & S2 heard, RRR. No JVD. Gastrointestinal system: Abdomen is nondistended, soft and nontender.  Extremities: No pedal edema. Skin: No rashes, lesions or ulcers  Data Reviewed:    Labs: Basic Metabolic Panel: Recent Labs  Lab 08/25/20 1813 08/25/20 1813 08/26/20 1200 08/26/20 1200 08/27/20 0454 08/28/20 0500  NA 142  --  142  --  141 141  K 4.1   < > 4.3   < > 4.2 3.7  CL 107  --  105  --  104 99  CO2 24  --  23  --  23 27  GLUCOSE 109*  --  95  --  153* 151*  BUN 31*  --  26*  --  28* 32*  CREATININE 1.54*  --  1.40*  --  1.30* 1.27*  CALCIUM 9.9  --  9.9  --  9.3 9.4  MG  --   --   --   --  1.8 2.0  PHOS  --   --   --   --  4.2 4.1   < > = values in this interval not displayed.   GFR Estimated Creatinine Clearance: 61.1 mL/min (A) (by C-G formula based on SCr of 1.27 mg/dL (H)). Liver Function Tests: Recent Labs  Lab 08/26/20 1200 08/27/20 0454 08/28/20 0500  AST 37 31 28  ALT 23 22 21   ALKPHOS 42 46 48  BILITOT 1.0 0.4 0.6  PROT 6.9 6.5 6.7  ALBUMIN 2.8* 2.6* 2.9*   No results for input(s): LIPASE, AMYLASE in the last 168 hours. No results for input(s): AMMONIA in the last 168 hours. Coagulation profile Recent Labs  Lab 08/26/20 0826 08/27/20 0454 08/28/20 0832  INR 2.5* 3.6* 4.9*     COVID-19 Labs  Recent Labs    08/26/20 0826 08/26/20 1200 08/27/20 0454 08/28/20 0500  DDIMER 1.67*  --  >20.00* >20.00*  FERRITIN  --  1,798* 1,724* 1,612*  LDH  --  278*  --   --   CRP  --  11.3* 9.5* 4.5*    Lab  Results  Component Value Date   SARSCOV2NAA POSITIVE (A) 08/26/2020   [place] NEGATIVE 04/21/2020    CBC: Recent Labs  Lab 08/25/20 1813 08/27/20 0221 08/27/20 0454 08/28/20 0500  WBC 7.9  --  7.6 16.1*  NEUTROABS  --   --  7.0 14.6*  HGB 10.7* 11.6* 10.5* 12.2*  HCT 35.0* 36.8* 34.6* 38.4*  MCV 83.1  --  82.8 81.9  PLT 332  --  370 402*   Cardiac Enzymes: No results for input(s): CKTOTAL, CKMB, CKMBINDEX, TROPONINI in the last 168 hours. BNP (last 3 results) No results for input(s): PROBNP in the last 8760 hours. CBG: Recent Labs  Lab 08/27/20 1646  GLUCAP 172*   D-Dimer: Recent Labs    08/27/20 0454 08/28/20 0500  DDIMER >20.00* >20.00*   Hgb A1c: No results for input(s): HGBA1C in the last 72 hours. Lipid Profile: Recent Labs    08/26/20 1200  TRIG 139   Thyroid function studies: No results for input(s): TSH, T4TOTAL, T3FREE, THYROIDAB in the last 72 hours.  Invalid input(s): FREET3 Anemia work up: Recent Labs    08/27/20 0454 08/28/20 0500  FERRITIN 1,724* 1,612*   Sepsis Labs: Recent Labs  Lab 08/25/20 1813 08/26/20 1135 08/26/20 1200 08/27/20 0454 08/28/20 0500  PROCALCITON  --   --  0.12  --   --   WBC 7.9  --   --  7.6 16.1*  LATICACIDVEN  --  1.4  --   --   --    Microbiology Recent Results (from the past 240 hour(s))  SARS Coronavirus 2 by RT PCR (hospital order, performed in [facility] hospital lab) Nasopharyngeal Nasopharyngeal Swab     Status: Abnormal   Collection Time: 08/26/20  6:55 AM   Specimen: Nasopharyngeal Swab  Result Value Ref Range Status   SARS Coronavirus 2 POSITIVE (A) NEGATIVE Final    Comment: emailed L. Berdik RN 10:00 08/26/20 ([email or web address]) (NOTE) SARS-CoV-2 target nucleic acids  are DETECTED  SARS-CoV-2 RNA is generally detectable in upper respiratory specimens  during the acute phase of infection.  Positive results are indicative  of the presence of the identified virus, but do not rule out bacterial infection or co-infection with other pathogens not detected by the test.  Clinical correlation with patient history and  other diagnostic information is necessary to determine patient infection status.  The expected result is negative.  Fact Sheet for Patients:   StrictlyIdeas.no   Fact Sheet for Healthcare Providers:   BankingDealers.co.za    This test is not yet approved or cleared by the [place] FDA and  has been authorized for detection and/or diagnosis of SARS-CoV-2 by FDA under an Emergency Use Authorization (EUA).  This EUA will remain in effect (meaning this test can be used) for the duration of  the  COVID-19 declaration under Section 564(b)(1) of the Act, 21 U.S.C. section 360-bbb-3(b)(1), unless the authorization is terminated or revoked sooner.  Performed at [facility], [place] [address] [place], [place] [address]   Blood Culture (routine x 2)     Status: None (Preliminary result)   Collection Time: 08/26/20 11:36 AM   Specimen: BLOOD  Result Value Ref Range Status   Specimen Description BLOOD RIGHT ANTECUBITAL  Final   Special Requests   Final    BOTTLES DRAWN AEROBIC AND ANAEROBIC Blood Culture adequate volume   Culture   Final    NO GROWTH 2 DAYS Performed at [facility], [place] [address],  [place], [place] [address]    Report Status PENDING  Incomplete  Blood Culture (routine x 2)     Status: None (Preliminary result)   Collection Time: 08/27/20  4:56 AM   Specimen: BLOOD LEFT FOREARM  Result Value Ref Range Status   Specimen Description BLOOD LEFT FOREARM  Final   Special Requests   Final    BOTTLES DRAWN AEROBIC AND ANAEROBIC Blood Culture results may not be optimal due  to an inadequate volume of blood received in culture bottles   Culture   Final    NO GROWTH 1 DAY Performed at [facility], [place] [address] [place], [place] [address]    Report Status PENDING  Incomplete     Medications:   . albuterol  2 puff Inhalation Q6H  . vitamin C  500 mg Oral Daily  . digoxin  0.125 mg Oral Daily  . fenofibrate  160 mg Oral Daily  . ferrous sulfate  325 mg Oral BID  . folic acid  1 mg Oral QPM  . furosemide  40 mg Oral Q24H  . furosemide  80 mg Oral Q24H  . losartan  25 mg Oral Daily  . methylPREDNISolone (SOLU-MEDROL) injection  80 mg Intravenous Q12H  . metoprolol succinate  25 mg Oral Daily  . pantoprazole  40 mg Oral BID  . pravastatin  40 mg Oral Daily  . sertraline  150 mg Oral Q1400  . Warfarin - Pharmacist Dosing Inpatient   Does not apply q1600  . zinc sulfate  220 mg Oral Daily   Continuous Infusions: . remdesivir 100 mg in NS 100 mL Stopped (08/28/20 1112)      LOS: 3 days   Charlynne Cousins  [facility]  08/29/2020, 7:29 AM

## 2020-08-29 NOTE — Progress Notes (Signed)
[place] for warfarin Indication: atrial fibrillation  Allergies  Allergen Reactions  . Keflex [Cephalexin] Shortness Of Breath and Other (See Comments)    dizziness    Patient Measurements: Height: 5\' 10"  (177.8 cm) Weight: 96 kg (211 lb 10.3 oz) IBW/kg (Calculated) : 73   Vital Signs: Temp: 97.7 F (36.5 C) (09/11 0745) Temp Source: Oral (09/11 0745) BP: 124/57 (09/11 0745) Pulse Rate: 70 (09/11 0745)  Labs: Recent Labs    08/27/20 0221 08/27/20 0221 08/27/20 0454 08/28/20 0500 08/28/20 0832 08/29/20 0742  HGB 11.6*   < > 10.5* 12.2*  --   --   HCT 36.8*  --  34.6* 38.4*  --   --   PLT  --   --  370 402*  --   --   LABPROT  --   --  34.6*  --  44.2*  --   INR  --   --  3.6*  --  4.9*  --   CREATININE  --   --  1.30* 1.27*  --  1.43*   < > = values in this interval not displayed.    Estimated Creatinine Clearance: 54.3 mL/min (A) (by C-G formula based on SCr of 1.43 mg/dL (H)).   Medical History: Past Medical History:  Diagnosis Date  . AICD (automatic cardioverter/defibrillator) present   . Anemia   . Anxiety   . CHF (congestive heart failure) ([place])   . Chronic renal insufficiency   . Coronary artery disease   . Depression   . Dyslipidemia   . Dysrhythmia    atrial fibrillation  . ED (erectile dysfunction)   . GERD (gastroesophageal reflux disease)   . Incisional hernia, without obstruction or gangrene 04/10/2017  . Ischemic cardiomyopathy    EF 23%  . MI (myocardial infarction) ([place]) 2000   ANTERIOR, s/p PCI  . Other primary cardiomyopathies   . Peripheral vascular disease ([place])    dvt legs 2017  . Persistent atrial fibrillation (HCC)    on coumadin  . Presence of permanent cardiac pacemaker   . Shortness of breath dyspnea    on exertion   Assessment: 37 YOM presenting with SOB COVID +, hx of afib on warfarin PTA.  INR therapeutic on admission at 2.5, last dose taken 9/6  PTA dosing: 3mg  daily  INR  today remains SUPRAtherapeutic and rising despite holding another dose (INR 4.3<<4.9 << 3.6, goal of 2-3). CBC stable, no bleeding noted at this time. Kidney function has also worsened, SCr 1.43<<1.27.  Goal of Therapy:  INR 2-3 Monitor platelets by anticoagulation protocol: Yes   Plan:  - Hold warfarin again today - Daily PT/INR, CBC q72h - Will continue to monitor for any signs/symptoms of bleeding and will follow up with PT/INR in the a.m.    Thank you for allowing pharmacy to be a part of this patient's care.  Mercy Riding, PharmD PGY1 Acute Care Pharmacy Resident Please refer to [facility] for unit-specific pharmacist

## 2020-08-29 NOTE — Progress Notes (Signed)
Called and spoke to pt.'s wife Caedan Sumler and updated her about pt.'s condition, all questions answered.

## 2020-08-29 NOTE — Evaluation (Signed)
Physical Therapy Evaluation Patient Details Name: [name] MRN: [id number] DOB: 1946/01/11 Today's Date: 08/29/2020   History of Present Illness  REA RESER is a 74 y.o. male with medical history significant of nonischemic cardiomyopathy with ejection fraction of 25-30%, status post ICD placement, permanent A. fib/atrial flutter-on Coumadin, hypertension, hyperlipidemia, depression/anxiety, GERD, presents to emergency department with worsening shortness of breath and cough since 1 week. Tested pos for covid.   Clinical Impression  Pt admitted with above diagnosis. Pt presents with generalized weakness and O2 desat to upper 70's with mobility with light headedness noted by pt. Pt transferred bed to [facility] with min HHA. Then short distance ambulation with RW and min A. Pt practiced sit>stand several times and was planning to do standing there ex but pt could not tolerate. Worked on IS in sitting and scapular retraction x10.  Pt currently with functional limitations due to the deficits listed below (see PT Problem List). Pt will benefit from skilled PT to increase their independence and safety with mobility to allow discharge to the venue listed below.      Follow Up Recommendations Home health PT;Supervision - Intermittent    Equipment Recommendations  None recommended by PT    Recommendations for Other Services       Precautions / Restrictions Precautions Precautions: Fall Restrictions Weight Bearing Restrictions: No      Mobility  Bed Mobility Overal bed mobility: Needs Assistance Bed Mobility: Sit to Supine       Sit to supine: Min assist   General bed mobility comments: pt sitting EOB upon PT arrival. When returning to supine pt needed min A to LE's for back into bed. Was then able to bridge knees and scoot in bed  Transfers Overall transfer level: Needs assistance Equipment used: Rolling walker (2 wheeled) Transfers: Sit to/from Stand Sit to Stand: Min  assist Stand pivot transfers: Min assist       General transfer comment: min A to steady and increased time needed. Performed 3x from bed for strengthening, pt could not tolerate more.   Ambulation/Gait Ambulation/Gait assistance: Min assist Gait Distance (Feet): 2 Feet Assistive device: Rolling walker (2 wheeled) Gait Pattern/deviations: Step-to pattern;Decreased stride length;Trunk flexed Gait velocity: decreased Gait velocity interpretation: <1.31 ft/sec, indicative of household ambulator General Gait Details: pt desat to 79% with mobility. Min A for safety. Very short step length  Stairs            Wheelchair Mobility    Modified Rankin (Stroke Patients Only)       Balance Overall balance assessment: Needs assistance Sitting-balance support: Feet supported;No upper extremity supported Sitting balance-[name] Scale: Fair     Standing balance support: No upper extremity supported;During functional activity Standing balance-[name] Scale: Fair Standing balance comment: able to stand without support while getting cleaned up from [facility] but unable to wt shift to wipe self and unable to accept challenge                             Pertinent Vitals/Pain Pain Assessment: No/denies pain    Home Living Family/patient expects to be discharged to:: Private residence Living Arrangements: Spouse/significant other Available Help at Discharge: Family;Available 24 hours/day Type of Home: House Home Access: Stairs to enter Entrance Stairs-Rails: None Entrance Stairs-Number of Steps: 1 Home Layout: One level Home Equipment: Walker - 2 wheels;Cane - single point;Bedside commode;Shower seat Additional Comments: retired, lives with wife    Prior Function Level  of Independence: Independent with assistive device(s)         Comments: ambulates with cane, independent but he reports everything takes him a long time. And his wife usually drives     Hand Dominance         Extremity/Trunk Assessment   Upper Extremity Assessment Upper Extremity Assessment: Generalized weakness    Lower Extremity Assessment Lower Extremity Assessment: Generalized weakness    Cervical / Trunk Assessment Cervical / Trunk Assessment: Kyphotic  Communication   Communication: No difficulties  Cognition Arousal/Alertness: Awake/alert Behavior During Therapy: WFL for tasks assessed/performed Overall Cognitive Status: Within Functional Limits for tasks assessed                                        General Comments General comments (skin integrity, edema, etc.): Pt on 11L [place], sats ranged upper 70's and pt felt light headed with this, up to upper 80's.     Exercises     Assessment/Plan    PT Assessment Patient needs continued PT services  PT Problem List Decreased strength;Decreased activity tolerance;Decreased balance;Decreased mobility;Cardiopulmonary status limiting activity       PT Treatment Interventions DME instruction;Gait training;Stair training;Functional mobility training;Therapeutic activities;Therapeutic exercise;Balance training;Patient/family education    PT Goals (Current goals can be found in the Care Plan section)  Acute Rehab PT Goals Patient Stated Goal: get better PT Goal Formulation: With patient Time For Goal Achievement: 09/12/20 Potential to Achieve Goals: Good    Frequency Min 3X/week   Barriers to discharge        Co-evaluation               AM-PAC PT "6 Clicks" Mobility  Outcome Measure Help needed turning from your back to your side while in a flat bed without using bedrails?: None Help needed moving from lying on your back to sitting on the side of a flat bed without using bedrails?: A Little Help needed moving to and from a bed to a chair (including a wheelchair)?: A Little Help needed standing up from a chair using your arms (e.g., wheelchair or bedside chair)?: A Little Help needed to walk in  hospital room?: A Little Help needed climbing 3-5 steps with a railing? : A Lot 6 Click Score: 18    End of Session Equipment Utilized During Treatment: Oxygen Activity Tolerance: Patient limited by fatigue Patient left: in bed;with call bell/phone within reach;with bed alarm set Nurse Communication: Mobility status PT Visit Diagnosis: Unsteadiness on feet (R26.81);Muscle weakness (generalized) (M62.81);Dizziness and giddiness (R42)    Time: 7741-2878 PT Time Calculation (min) (ACUTE ONLY): 29 min   Charges:   PT Evaluation $PT Eval Moderate Complexity: 1 Mod PT Treatments $Therapeutic Activity: 8-22 mins        Leighton Roach, PT  Acute Rehab Services  Pager [phone] Office [place] 08/29/2020, 4:47 PM

## 2020-08-30 LAB — CBC WITH DIFFERENTIAL/PLATELET
Abs Immature Granulocytes: 0.33 10*3/uL — ABNORMAL HIGH (ref 0.00–0.07)
Basophils Absolute: 0 10*3/uL (ref 0.0–0.1)
Basophils Relative: 0 %
Eosinophils Absolute: 0 10*3/uL (ref 0.0–0.5)
Eosinophils Relative: 0 %
HCT: 34.6 % — ABNORMAL LOW (ref 39.0–52.0)
Hemoglobin: 11 g/dL — ABNORMAL LOW (ref 13.0–17.0)
Immature Granulocytes: 2 %
Lymphocytes Relative: 5 %
Lymphs Abs: 0.8 10*3/uL (ref 0.7–4.0)
MCH: 25.3 pg — ABNORMAL LOW (ref 26.0–34.0)
MCHC: 31.8 g/dL (ref 30.0–36.0)
MCV: 79.5 fL — ABNORMAL LOW (ref 80.0–100.0)
Monocytes Absolute: 0.5 10*3/uL (ref 0.1–1.0)
Monocytes Relative: 4 %
Neutro Abs: 12.5 10*3/uL — ABNORMAL HIGH (ref 1.7–7.7)
Neutrophils Relative %: 89 %
Platelets: 329 10*3/uL (ref 150–400)
RBC: 4.35 MIL/uL (ref 4.22–5.81)
RDW: 15.5 % (ref 11.5–15.5)
WBC: 14.1 10*3/uL — ABNORMAL HIGH (ref 4.0–10.5)
nRBC: 0 % (ref 0.0–0.2)

## 2020-08-30 LAB — COMPREHENSIVE METABOLIC PANEL
ALT: 16 U/L (ref 0–44)
AST: 20 U/L (ref 15–41)
Albumin: 2.3 g/dL — ABNORMAL LOW (ref 3.5–5.0)
Alkaline Phosphatase: 47 U/L (ref 38–126)
Anion gap: 12 (ref 5–15)
BUN: 49 mg/dL — ABNORMAL HIGH (ref 8–23)
CO2: 29 mmol/L (ref 22–32)
Calcium: 8.7 mg/dL — ABNORMAL LOW (ref 8.9–10.3)
Chloride: 100 mmol/L (ref 98–111)
Creatinine, Ser: 1.53 mg/dL — ABNORMAL HIGH (ref 0.61–1.24)
GFR calc Af Amer: 52 mL/min — ABNORMAL LOW (ref >60)
GFR calc non Af Amer: 45 mL/min — ABNORMAL LOW (ref >60)
Glucose, Bld: 139 mg/dL — ABNORMAL HIGH (ref 70–99)
Potassium: 3.5 mmol/L (ref 3.5–5.1)
Sodium: 141 mmol/L (ref 135–145)
Total Bilirubin: 0.9 mg/dL (ref 0.3–1.2)
Total Protein: 6 g/dL — ABNORMAL LOW (ref 6.5–8.1)

## 2020-08-30 LAB — FERRITIN: Ferritin: 1075 ng/mL — ABNORMAL HIGH (ref 24–336)

## 2020-08-30 LAB — C-REACTIVE PROTEIN: CRP: 6.5 mg/dL — ABNORMAL HIGH (ref <1.0)

## 2020-08-30 LAB — PHOSPHORUS: Phosphorus: 4.1 mg/dL (ref 2.5–4.6)

## 2020-08-30 LAB — MAGNESIUM: Magnesium: 2 mg/dL (ref 1.7–2.4)

## 2020-08-30 LAB — PROTIME-INR
INR: 4.2 (ref 0.8–1.2)
Prothrombin Time: 35.7 seconds — ABNORMAL HIGH (ref 11.4–15.2)

## 2020-08-30 LAB — D-DIMER, QUANTITATIVE: D-Dimer, Quant: 15.62 ug/mL-FEU — ABNORMAL HIGH (ref 0.00–0.50)

## 2020-08-30 NOTE — Progress Notes (Signed)
[facility] PROGRESS NOTE    Progress Note  [name]  PXT:[id number] DOB: [birth year] DOA: 08/25/2020 PCP: [name], MD     Brief Narrative:   [name] is an 74 y.o. male past medical history significant for nonischemic cardiomyopathy with an EF of 25% status post AICD, permanent atrial fibrillation/atrial flutter on Coumadin who is fully vaccinated in March went [place] an [place] several days prior to admission started developing shortness of breath at rest with cough nasal congestion loose stools and generalized weakness.  In the ED was found tachypneic hypoxic placed on 2 L of oxygen SARS-CoV-2 PCR was positive tropes 25 D-dimer 1.6 BNP of 425 creatinine at baseline chest x-ray showed bilateral diffuse groundglass opacities asymmetric.  Assessment/Plan:   Acute respiratory failure with hypoxia secondary to Pneumonia due to COVID-19 virus He is requiring 8 to 10 L to keep saturations greater than 88%. Continue IV remdesivir and steroids. Has remained afebrile his leukocytosis is improving. Need to continue to encourage incentive spirometry and flutter valve. He has not proning or using the incentive spirometry. His CRP continues to be extremely elevated, his D-dimer is improving. Overall his prognosis is poor he has refused baricitinib and IV Actemra. Now today he wants to talk about baricitinib and Actemra I have expressed to him that I think the window benefit is gone. He is not sure if he would want to receive it. I explained to him now the risk of complications from these medications versus the benefits are higher.  Acute on chronic systolic heart failure: He continues to be negative about 2 L. He is currently on his home dose of Lasix, he appears euvolemic on physical exam. His blood pressure, electrolytes and creatinine are holding steady. Continue Lasix metoprolol losartan and digoxin.  Permanent atrial fibrillation: INR therapeutic rate  controlled continue Coumadin per pharmacy. Hold Coumadin INR is 4.2.  Essential hypertension: Continue losartan metoprolol and Lasix. Blood pressure seems to be well controlled continue current regimen.  Chronic kidney disease stage IIIa: Appears to be at baseline.  Coronary artery disease status post PCI: Continue statins, metoprolol, losartan and aspirin.  Hyperlipidemia: Continue fenofibrate and statins.  Small amount of black stool: His blood pressure likely due to hemorrhoids, he had a colonoscopy 08/31/2016 that showed thrombosed external hemorrhoids, no further signs of bleeding his hemoglobin has remained stable.  Acute confusional state: Continue Haldol IV as needed.   DVT prophylaxis: coumadin Family Communication:wife Status is: Inpatient  Remains inpatient appropriate because:Persistent severe electrolyte disturbances   Dispo: The patient is from: Home              Anticipated d/c is to: Home              Anticipated d/c date is: > 3 days              Patient currently is not medically stable to d/c.  Code Status:     Code Status Orders  (From admission, onward)         Start     Ordered   08/26/20 1045  Full code  Continuous        08/26/20 1046        Code Status History    Date Active Date Inactive Code Status Order ID Comments User Context   04/24/2020 1531 04/24/2020 2124 Full Code [id number]  [name], MD Inpatient   09/30/2017 0116 10/02/2017 1531 Full Code [id number]  [name], MD  ED   04/10/2017 1748 04/14/2017 1428 Full Code [id number]  [name], MD Inpatient   01/25/2017 1411 01/25/2017 2220 Full Code [id number]  [name], MD Inpatient   03/02/2016 1628 03/16/2016 2023 Full Code [id number]  [name] Inpatient   02/18/2016 1408 03/02/2016 1613 Full Code [id number]  [name], MD ED   Advance Care Planning Activity        IV Access:    Peripheral IV   Procedures and diagnostic studies:   No results  found.   Medical Consultants:    None.  Anti-Infectives:   none  Subjective:    [name] relates his breathing is better than yesterday.  Objective:    Vitals:   08/29/20 1952 08/30/20 0036 08/30/20 0357 08/30/20 0906  BP: (!) 105/51 (!) 110/57 (!) 110/51 (!) 121/59  Pulse: 72 71 69 71  Resp: 19 16 20 18   Temp: 98.3 F (36.8 C) 98.6 F (37 C) 98.4 F (36.9 C) 98.3 F (36.8 C)  TempSrc: Oral Oral Oral Oral  SpO2: (!) 86% 90% (!) 81% (!) 85%  Weight:  88.1 kg 88.5 kg   Height:       SpO2: (!) 85 % O2 Flow Rate (L/min): 10 L/min   Intake/Output Summary (Last 24 hours) at 08/30/2020 1041 Last data filed at 08/30/2020 0641 Gross per 24 hour  Intake 840 ml  Output 1875 ml  Net -1035 ml   Filed Weights   08/29/20 0647 08/30/20 0036 08/30/20 0357  Weight: 88.1 kg 88.1 kg 88.5 kg    Exam: General exam: In no acute distress. Respiratory system: Good air movement and diffuse crackles bilaterally.. Cardiovascular system: S1 & S2 heard, RRR. No JVD. Gastrointestinal system: Abdomen is nondistended, soft and nontender.  Extremities: No pedal edema. Skin: No rashes, lesions or ulcers  Data Reviewed:    Labs: Basic Metabolic Panel: Recent Labs  Lab 08/26/20 1200 08/26/20 1200 08/27/20 0454 08/27/20 0454 08/28/20 0500 08/28/20 0500 08/29/20 0742 08/30/20 0708  NA 142  --  141  --  141  --  141 141  K 4.3   < > 4.2   < > 3.7   < > 3.5 3.5  CL 105  --  104  --  99  --  99 100  CO2 23  --  23  --  27  --  27 29  GLUCOSE 95  --  153*  --  151*  --  131* 139*  BUN 26*  --  28*  --  32*  --  38* 49*  CREATININE 1.40*  --  1.30*  --  1.27*  --  1.43* 1.53*  CALCIUM 9.9  --  9.3  --  9.4  --  9.1 8.7*  MG  --   --  1.8  --  2.0  --  1.9 2.0  PHOS  --   --  4.2  --  4.1  --  3.8 4.1   < > = values in this interval not displayed.   GFR Estimated Creatinine Clearance: 48.9 mL/min (A) (by C-G formula based on SCr of 1.53 mg/dL (H)). Liver Function  Tests: Recent Labs  Lab 08/26/20 1200 08/27/20 0454 08/28/20 0500 08/29/20 0742 08/30/20 0708  AST 37 31 28 25 20   ALT 23 22 21 18 16   ALKPHOS 42 46 48 52 47  BILITOT 1.0 0.4 0.6 0.8 0.9  PROT 6.9 6.5 6.7 6.3* 6.0*  ALBUMIN 2.8* 2.6* 2.9* 2.5*  2.3*   No results for input(s): LIPASE, AMYLASE in the last 168 hours. No results for input(s): AMMONIA in the last 168 hours. Coagulation profile Recent Labs  Lab 08/26/20 0826 08/27/20 0454 08/28/20 0832 08/29/20 0742 08/30/20 0708  INR 2.5* 3.6* 4.9* 4.3* 4.2*   COVID-19 Labs  Recent Labs    08/28/20 0500 08/29/20 0742 08/29/20 1530 08/30/20 0708  DDIMER >20.00* 13.99*  --  15.62*  FERRITIN 1,612*  --  1,582* 1,075*  CRP 4.5*  --  4.2* 6.5*    Lab Results  Component Value Date   SARSCOV2NAA POSITIVE (A) 08/26/2020   [place] NEGATIVE 04/21/2020    CBC: Recent Labs  Lab 08/25/20 1813 08/25/20 1813 08/27/20 0221 08/27/20 0454 08/28/20 0500 08/29/20 1530 08/30/20 0708  WBC 7.9  --   --  7.6 16.1* 17.2* 14.1*  NEUTROABS  --   --   --  7.0 14.6* 15.5* 12.5*  HGB 10.7*   < > 11.6* 10.5* 12.2* 11.3* 11.0*  HCT 35.0*   < > 36.8* 34.6* 38.4* 36.1* 34.6*  MCV 83.1  --   --  82.8 81.9 80.8 79.5*  PLT 332  --   --  370 402* 376 329   < > = values in this interval not displayed.   Cardiac Enzymes: No results for input(s): CKTOTAL, CKMB, CKMBINDEX, TROPONINI in the last 168 hours. BNP (last 3 results) No results for input(s): PROBNP in the last 8760 hours. CBG: Recent Labs  Lab 08/27/20 1646  GLUCAP 172*   D-Dimer: Recent Labs    08/29/20 0742 08/30/20 0708  DDIMER 13.99* 15.62*   Hgb A1c: No results for input(s): HGBA1C in the last 72 hours. Lipid Profile: No results for input(s): CHOL, HDL, LDLCALC, TRIG, CHOLHDL, LDLDIRECT in the last 72 hours. Thyroid function studies: No results for input(s): TSH, T4TOTAL, T3FREE, THYROIDAB in the last 72 hours.  Invalid input(s): FREET3 Anemia work  up: Recent Labs    08/29/20 1530 08/30/20 0708  FERRITIN 1,582* 1,075*   Sepsis Labs: Recent Labs  Lab 08/25/20 1813 08/26/20 1135 08/26/20 1200 08/27/20 0454 08/28/20 0500 08/29/20 1530 08/30/20 0708  PROCALCITON  --   --  0.12  --   --   --   --   WBC   < >  --   --  7.6 16.1* 17.2* 14.1*  LATICACIDVEN  --  1.4  --   --   --   --   --    < > = values in this interval not displayed.   Microbiology Recent Results (from the past 240 hour(s))  SARS Coronavirus 2 by RT PCR (hospital order, performed in [facility] hospital lab) Nasopharyngeal Nasopharyngeal Swab     Status: Abnormal   Collection Time: 08/26/20  6:55 AM   Specimen: Nasopharyngeal Swab  Result Value Ref Range Status   SARS Coronavirus 2 POSITIVE (A) NEGATIVE Final    Comment: emailed L. Berdik RN 10:00 08/26/20 ([email or web address]) (NOTE) SARS-CoV-2 target nucleic acids are DETECTED  SARS-CoV-2 RNA is generally detectable in upper respiratory specimens  during the acute phase of infection.  Positive results are indicative  of the presence of the identified virus, but do not rule out bacterial infection or co-infection with other pathogens not detected by the test.  Clinical correlation with patient history and  other diagnostic information is necessary to determine patient infection status.  The expected result is negative.  Fact Sheet for Patients:   StrictlyIdeas.no   Fact Sheet for  Healthcare Providers:   BankingDealers.co.za    This test is not yet approved or cleared by the [place] and  has been authorized for detection and/or diagnosis of SARS-CoV-2 by FDA under an Emergency Use Authorization (EUA).  This EUA will remain in effect (meaning this test can be used) for the duration of  the  COVID-19 declaration under Section 564(b)(1) of the Act, 21 U.S.C. section 360-bbb-3(b)(1), unless the authorization is terminated or revoked sooner.  Performed at  [facility], [place] [address] [place], [place] [address]   Blood Culture (routine x 2)     Status: None (Preliminary result)   Collection Time: 08/26/20 11:36 AM   Specimen: BLOOD  Result Value Ref Range Status   Specimen Description BLOOD RIGHT ANTECUBITAL  Final   Special Requests   Final    BOTTLES DRAWN AEROBIC AND ANAEROBIC Blood Culture adequate volume   Culture   Final    NO GROWTH 3 DAYS Performed at [facility], [place] [address] [place], [place] [address]    Report Status PENDING  Incomplete  Blood Culture (routine x 2)     Status: None (Preliminary result)   Collection Time: 08/27/20  4:56 AM   Specimen: BLOOD LEFT FOREARM  Result Value Ref Range Status   Specimen Description BLOOD LEFT FOREARM  Final   Special Requests   Final    BOTTLES DRAWN AEROBIC AND ANAEROBIC Blood Culture results may not be optimal due to an inadequate volume of blood received in culture bottles   Culture   Final    NO GROWTH 2 DAYS Performed at [facility], [place] [address], [place], [place] [address]    Report Status PENDING  Incomplete     Medications:    albuterol  2 puff Inhalation Q6H   vitamin C  500 mg Oral Daily   digoxin  0.125 mg Oral Daily   fenofibrate  160 mg Oral Daily   ferrous sulfate  325 mg Oral BID   folic acid  1 mg Oral QPM   furosemide  40 mg Oral Q24H   furosemide  80 mg Oral Q24H   losartan  25 mg Oral Daily   methylPREDNISolone (SOLU-MEDROL) injection  80 mg Intravenous Q12H   metoprolol succinate  25 mg Oral Daily   pantoprazole  40 mg Oral BID   pravastatin  40 mg Oral Daily   sertraline  150 mg Oral Q1400   Warfarin - Pharmacist Dosing Inpatient   Does not apply q1600   zinc sulfate  220 mg Oral Daily   Continuous Infusions:     LOS: 4 days   Charlynne Cousins  [facility]  08/30/2020, 10:41 AM

## 2020-08-30 NOTE — Progress Notes (Signed)
[place] for warfarin Indication: atrial fibrillation  Allergies  Allergen Reactions  . Keflex [Cephalexin] Shortness Of Breath and Other (See Comments)    dizziness    Patient Measurements: Height: 5\' 10"  (177.8 cm) Weight: 88.5 kg (195 lb 1.7 oz) IBW/kg (Calculated) : 73   Vital Signs: Temp: 98.3 F (36.8 C) (09/12 0906) Temp Source: Oral (09/12 0906) BP: 121/59 (09/12 0906) Pulse Rate: 71 (09/12 0906)  Labs: Recent Labs    08/28/20 0500 08/28/20 0500 08/28/20 0832 08/29/20 0742 08/29/20 1530 08/30/20 0708  HGB 12.2*   < >  --   --  11.3* 11.0*  HCT 38.4*  --   --   --  36.1* 34.6*  PLT 402*  --   --   --  376 329  LABPROT  --   --  44.2* 39.9*  --  35.7*  INR  --   --  4.9* 4.3*  --  4.2*  CREATININE 1.27*  --   --  1.43*  --  1.53*   < > = values in this interval not displayed.    Estimated Creatinine Clearance: 48.9 mL/min (A) (by C-G formula based on SCr of 1.53 mg/dL (H)).   Medical History: Past Medical History:  Diagnosis Date  . AICD (automatic cardioverter/defibrillator) present   . Anemia   . Anxiety   . CHF (congestive heart failure) ([place])   . Chronic renal insufficiency   . Coronary artery disease   . Depression   . Dyslipidemia   . Dysrhythmia    atrial fibrillation  . ED (erectile dysfunction)   . GERD (gastroesophageal reflux disease)   . Incisional hernia, without obstruction or gangrene 04/10/2017  . Ischemic cardiomyopathy    EF 23%  . MI (myocardial infarction) ([place]) 2000   ANTERIOR, s/p PCI  . Other primary cardiomyopathies   . Peripheral vascular disease ([place])    dvt legs 2017  . Persistent atrial fibrillation (HCC)    on coumadin  . Presence of permanent cardiac pacemaker   . Shortness of breath dyspnea    on exertion   Assessment: 40 YOM presenting with SOB COVID +, hx of afib on warfarin PTA.  INR therapeutic on admission at 2.5, last dose taken 9/6  PTA dosing: 3mg  daily  INR  today remains SUPRAtherapeutic and rising despite holding another dose (INR 4.2, goal of 2-3). CBC stable, no bleeding noted at this time. Kidney function is declining, SCr up to 1.5.   Goal of Therapy:  INR 2-3 Monitor platelets by anticoagulation protocol: Yes   Plan:  - Hold warfarin again today - Daily PT/INR, CBC q72h - Will continue to monitor for any signs/symptoms of bleeding   Thank you for allowing pharmacy to be a part of this patient's care.  Mercy Riding, PharmD PGY1 Acute Care Pharmacy Resident Please refer to [facility] for unit-specific pharmacist

## 2020-08-30 NOTE — Progress Notes (Signed)
Dr. Venetia Constable paged via Amion about INR results of 4.2. Waiting for response

## 2020-08-31 ENCOUNTER — Inpatient Hospital Stay (HOSPITAL_COMMUNITY): Payer: Medicare Other

## 2020-08-31 LAB — COMPREHENSIVE METABOLIC PANEL
ALT: 14 U/L (ref 0–44)
AST: 24 U/L (ref 15–41)
Albumin: 2.3 g/dL — ABNORMAL LOW (ref 3.5–5.0)
Alkaline Phosphatase: 55 U/L (ref 38–126)
Anion gap: 13 (ref 5–15)
BUN: 46 mg/dL — ABNORMAL HIGH (ref 8–23)
CO2: 30 mmol/L (ref 22–32)
Calcium: 8.5 mg/dL — ABNORMAL LOW (ref 8.9–10.3)
Chloride: 99 mmol/L (ref 98–111)
Creatinine, Ser: 1.42 mg/dL — ABNORMAL HIGH (ref 0.61–1.24)
GFR calc Af Amer: 57 mL/min — ABNORMAL LOW (ref >60)
GFR calc non Af Amer: 49 mL/min — ABNORMAL LOW (ref >60)
Glucose, Bld: 121 mg/dL — ABNORMAL HIGH (ref 70–99)
Potassium: 3.5 mmol/L (ref 3.5–5.1)
Sodium: 142 mmol/L (ref 135–145)
Total Bilirubin: 0.7 mg/dL (ref 0.3–1.2)
Total Protein: 6 g/dL — ABNORMAL LOW (ref 6.5–8.1)

## 2020-08-31 LAB — CBC WITH DIFFERENTIAL/PLATELET
Abs Immature Granulocytes: 0.53 10*3/uL — ABNORMAL HIGH (ref 0.00–0.07)
Basophils Absolute: 0.1 10*3/uL (ref 0.0–0.1)
Basophils Relative: 0 %
Eosinophils Absolute: 0 10*3/uL (ref 0.0–0.5)
Eosinophils Relative: 0 %
HCT: 39.4 % (ref 39.0–52.0)
Hemoglobin: 12.6 g/dL — ABNORMAL LOW (ref 13.0–17.0)
Immature Granulocytes: 3 %
Lymphocytes Relative: 3 %
Lymphs Abs: 0.5 10*3/uL — ABNORMAL LOW (ref 0.7–4.0)
MCH: 25.8 pg — ABNORMAL LOW (ref 26.0–34.0)
MCHC: 32 g/dL (ref 30.0–36.0)
MCV: 80.7 fL (ref 80.0–100.0)
Monocytes Absolute: 0.5 10*3/uL (ref 0.1–1.0)
Monocytes Relative: 3 %
Neutro Abs: 15.7 10*3/uL — ABNORMAL HIGH (ref 1.7–7.7)
Neutrophils Relative %: 91 %
Platelets: 339 10*3/uL (ref 150–400)
RBC: 4.88 MIL/uL (ref 4.22–5.81)
RDW: 15.9 % — ABNORMAL HIGH (ref 11.5–15.5)
WBC: 17.3 10*3/uL — ABNORMAL HIGH (ref 4.0–10.5)
nRBC: 0.1 % (ref 0.0–0.2)

## 2020-08-31 LAB — FERRITIN: Ferritin: 1396 ng/mL — ABNORMAL HIGH (ref 24–336)

## 2020-08-31 LAB — PHOSPHORUS: Phosphorus: 2.9 mg/dL (ref 2.5–4.6)

## 2020-08-31 LAB — PROTIME-INR
INR: 4 — ABNORMAL HIGH (ref 0.8–1.2)
Prothrombin Time: 38 seconds — ABNORMAL HIGH (ref 11.4–15.2)

## 2020-08-31 LAB — MAGNESIUM: Magnesium: 2.2 mg/dL (ref 1.7–2.4)

## 2020-08-31 LAB — PROCALCITONIN: Procalcitonin: <0.1 ng/mL

## 2020-08-31 LAB — D-DIMER, QUANTITATIVE: D-Dimer, Quant: 18.38 ug/mL-FEU — ABNORMAL HIGH (ref 0.00–0.50)

## 2020-08-31 LAB — C-REACTIVE PROTEIN: CRP: 7.5 mg/dL — ABNORMAL HIGH (ref <1.0)

## 2020-08-31 NOTE — Progress Notes (Signed)
Sats greater than 90% on heated high flow. Breathing better now. Patient desaturated getting oob to BRP before high flow heated o2 sat dropped down to 75% with exertion. Patient reports being light headed moving too quickly also. He reported unable tolerate sitting oob to chair this time told us bearly had energy get oob much less go to bathroom or sit in chair at bedside. He returned back to bed using rollator. He is asking for a wheelchair to roll back in forth from bathroom to bed feels weak reported short distance give the heated high flow time to work.

## 2020-08-31 NOTE — Plan of Care (Signed)
  Problem: Education: Goal: Knowledge of General Education information will improve Description: Including pain rating scale, medication(s)/side effects and non-pharmacologic comfort measures Outcome: Progressing   Problem: Activity: Goal: Risk for activity intolerance will decrease Outcome: Progressing   

## 2020-08-31 NOTE — Progress Notes (Signed)
[place] for warfarin Indication: atrial fibrillation  Allergies  Allergen Reactions  . Keflex [Cephalexin] Shortness Of Breath and Other (See Comments)    dizziness    Patient Measurements: Height: 5\' 10"  (177.8 cm) Weight: 85.6 kg (188 lb 11.4 oz) IBW/kg (Calculated) : 73   Vital Signs: Temp: 97.9 F (36.6 C) (09/13 1050) Temp Source: Oral (09/13 1050) BP: 99/52 (09/13 1050) Pulse Rate: 68 (09/13 1050)  Labs: Recent Labs    08/29/20 0742 08/29/20 1530 08/29/20 1530 08/30/20 0708 08/31/20 1131  HGB  --  11.3*   < > 11.0* 12.6*  HCT  --  36.1*  --  34.6* 39.4  PLT  --  376  --  329 339  LABPROT 39.9*  --   --  35.7* 38.0*  INR 4.3*  --   --  4.2* 4.0*  CREATININE 1.43*  --   --  1.53* 1.42*   < > = values in this interval not displayed.    Estimated Creatinine Clearance: 48.6 mL/min (A) (by C-G formula based on SCr of 1.42 mg/dL (H)).   Medical History: Past Medical History:  Diagnosis Date  . AICD (automatic cardioverter/defibrillator) present   . Anemia   . Anxiety   . CHF (congestive heart failure) ([place])   . Chronic renal insufficiency   . Coronary artery disease   . Depression   . Dyslipidemia   . Dysrhythmia    atrial fibrillation  . ED (erectile dysfunction)   . GERD (gastroesophageal reflux disease)   . Incisional hernia, without obstruction or gangrene 04/10/2017  . Ischemic cardiomyopathy    EF 23%  . MI (myocardial infarction) ([place]) 2000   ANTERIOR, s/p PCI  . Other primary cardiomyopathies   . Peripheral vascular disease ([place])    dvt legs 2017  . Persistent atrial fibrillation (HCC)    on coumadin  . Presence of permanent cardiac pacemaker   . Shortness of breath dyspnea    on exertion   Assessment: 52 YOM presenting with SOB COVID +, hx of afib on warfarin PTA.  INR therapeutic on admission at 2.5, last dose taken 9/6  PTA dosing: 3mg  daily  INR remains supratherapeutic today, downtrend very  slowly considering last dose 9/8.  Increase in meal % consumed.      Goal of Therapy:  INR 2-3 Monitor platelets by anticoagulation protocol: Yes   Plan:  Will hold again today and consider starting a low dose back tomorrow if INR continues slow trend down and pt continues eating well Daily INR, s/s bleeding  Bertis Ruddy, PharmD Clinical Pharmacist Please check AMION for all [place] numbers 08/31/2020 1:27 PM

## 2020-08-31 NOTE — TOC Initial Note (Addendum)
Transition of Care [facility]) - Initial/Assessment Note    Patient Details  Name: [name] MRN: [id number] Date of Birth: [birth year]  Transition of Care [facility]) CM/SW Contact:    Zenon Mayo, RN Phone Number: 08/31/2020, 10:12 AM  Clinical Narrative:                 From home with wife who also has covid per patient,  Per pt eval rec hhpt, NCM offered choice, he states he has no preference.  NCM made referral to [facility] with [facility] for HHPT, he is able to take referral.  Soc will begin 24 to 48 hrs post dc.  He states he will have transportation at dc and he has no issues with getting medications.    9/15- NCM received call from [facility] with Alvis Lemmings stating that he did not see that this was in [facility] they do not have staff available.  NCM made referral to [facility] with [facility] , she can not take referral.  NCM made referral to [facility] with [facility].  Awaiting call back. Per Cassie they are unable to staff.  NCM made referral to [place] with [facility] for HHPT, awaiting call back.   Expected Discharge Plan: [place] Barriers to Discharge: Continued Medical Work up   Patient Goals and CMS Choice Patient states their goals for this hospitalization and ongoing recovery are:: get better CMS Medicare.gov Compare Post Acute Care list provided to:: Patient Choice offered to / list presented to : Patient  Expected Discharge Plan and Services Expected Discharge Plan: [place] In-house Referral: NA Discharge Planning Services: CM Consult Post Acute Care Choice: NA Living arrangements for the past 2 months: Single Family Home                   DME Agency: NA       HH Arranged: PT HH Agency: [place] Date [place]: 08/31/20 Time HH Agency Contacted: 39 Representative spoke with at [place]: Tommi Rumps  Prior Living Arrangements/Services Living arrangements for the past 2 months: [place] Lives with::  Spouse Patient language and need for interpreter reviewed:: Yes Do you feel safe going back to the place where you live?: Yes      Need for Family Participation in Patient Care: Yes (Comment) Care giver support system in place?: Yes (comment)   Criminal Activity/Legal Involvement Pertinent to Current Situation/Hospitalization: No - Comment as needed  Activities of Daily Living      Permission Sought/Granted                  Emotional Assessment   Attitude/Demeanor/Rapport: Engaged Affect (typically observed): Appropriate Orientation: : Oriented to Self, Oriented to Place, Oriented to  Time, Oriented to Situation Alcohol / Substance Use: Not Applicable Psych Involvement: No (comment)  Admission diagnosis:  Chronic combined systolic and diastolic congestive heart failure (HCC) [I50.42] Acute respiratory failure with hypoxia ([place]) [J96.01] Acute hypoxemic respiratory failure due to COVID-19 ([place]) [U07.1, J96.01] COVID-19 [U07.1] Patient Active Problem List   Diagnosis Date Noted  . Pneumonia due to COVID-19 virus 08/26/2020  . Presence of permanent cardiac pacemaker   . Persistent atrial fibrillation ([place])   . Peripheral vascular disease ([place])   . ED (erectile dysfunction)   . Dysrhythmia   . Dyslipidemia   . Coronary artery disease   . Chronic renal insufficiency   . CHF (congestive heart failure) ([place])   . Anxiety   .  AICD (automatic cardioverter/defibrillator) present   . Sepsis ([place]) 09/30/2017  . Acute renal failure superimposed on chronic kidney disease ([place]) 09/30/2017  . Incisional hernia, without obstruction or gangrene 04/10/2017  . Incisional hernia without obstruction or gangrene 04/10/2017  . Edema extremities 06/14/2016  . Low hemoglobin 05/24/2016  . Depression 03/15/2016  . 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 Increased frequency of urination 03/15/2016  . Nocturia 03/15/2016  . Other seborrheic keratosis 03/15/2016  . Inflamed seborrheic  keratosis 03/15/2016  . Skin benign neoplasm 03/15/2016  . Anemia 03/06/2016  . Generalized abdominal pain 03/06/2016  . Urinary retention   . Hyperglycemia   . Atrial fibrillation with RVR ([place])   . Cardiomyopathy, ischemic-EF 20-25% 02/22/2016  . Anticoagulant long-term use 02/22/2016  . Epigastric pain 02/22/2016  . Acute respiratory failure with hypoxia ([place])   . Hypotension 02/18/2016  . Chronic atrial fibrillation ([place]) 02/18/2016  . History of transurethral resection of prostate 02/18/2016  . CKD (chronic kidney disease) stage 3, GFR 30-59 ml/min 02/18/2016  . Chronic kidney disease, stage III (moderate) 02/18/2016  . Gastrointestinal hemorrhage 02/18/2016  . Esophageal reflux 02/05/2016  . History of colon polyps 02/05/2016  . Cardiomyopathy ([place]) 02/05/2016  . BPH- s/p TURP 01/25/16 - followed by 4 weeks of Cipro 01/25/2016  . Benign prostatic hyperplasia 01/25/2016  . Nonischemic cardiomyopathy ([place])   . Noncompliance with treatment 06/08/2011  . 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 Sleep apnea 04/05/2011  . Cardiac defibrillator in place 04/05/2011  . Essential hypertension 03/09/2010  . CAD S/P LAD PCI- 2000 03/09/2010  . Atrial fibrillation ([place]) 03/09/2010  . Chronic systolic heart failure ([place]) 03/09/2010  . Hyperlipidemia 03/09/2010  . Chronic coronary artery disease 03/09/2010  . Disorder resulting from impaired renal function 03/09/2010  . MI (myocardial infarction) ([place]) 12/19/1998   PCP:  [name], MD Pharmacy:   [place], [place] - [phone] [place] [address] [place] [address] Phone: [phone] Fax: [place], [address], [address] [place], [address] [place] [address] Phone: [phone] Fax: [phone]     Social Determinants of Health (SDOH) Interventions     Readmission Risk Interventions Readmission Risk Prevention Plan 08/31/2020  Transportation Screening Complete  Medication Review ([place]) Complete  HRI or [place] Complete  [place] Not Applicable  Some recent data might be hidden

## 2020-08-31 NOTE — Telephone Encounter (Signed)
Remote transmission rescheduled from 08/31/2020 to 09/21/2020 since patient is currently hospitalized.

## 2020-08-31 NOTE — Progress Notes (Signed)
[facility] PROGRESS NOTE    Progress Note  [name]  BPZ:[id number] DOB: 09-25-1946 DOA: 08/25/2020 PCP: [name], MD     Brief Narrative:   [name] is an 74 y.o. male past medical history significant for nonischemic cardiomyopathy with an EF of 25% status post AICD, permanent atrial fibrillation/atrial flutter on Coumadin who is fully vaccinated in March went [place] an [place] several days prior to admission started developing shortness of breath at rest with cough nasal congestion loose stools and generalized weakness.  In the ED was found tachypneic hypoxic placed on 2 L of oxygen SARS-CoV-2 PCR was positive tropes 25 D-dimer 1.6 BNP of 425 creatinine at baseline chest x-ray showed bilateral diffuse groundglass opacities asymmetric.  Assessment/Plan:   Acute respiratory failure with hypoxia secondary to Pneumonia due to COVID-19 virus He is now requiring 11 L oxygen to keep saturation greater than 86%. His inflammatory markers continue to be elevated including his CRP and D-dimer, he did refuse the use of baricitinib and Actemra. He has completed his course of IV remdesivir will continue IV steroids for total of 10 days. He has remained afebrile with no leukocytosis. I try to encourage this patient try to prone for at least 16 hours he is adamant to do so due to back pain. He has not been using his incentive spirometry with flutter valve as indicated. Overall his prognosis is poor.  Acute on chronic systolic heart failure: He continues to be negative about 2 L. Is currently on his home dose of Lasix, will go ahead and hold his appears to be on the dry side. There is mild increase in his creatinine.  Permanent atrial fibrillation: INR therapeutic rate controlled continue Coumadin per pharmacy. Hold Coumadin INR is 4.2.  Essential hypertension: Continue losartan metoprolol and Lasix. Blood pressure seems to be well controlled continue current  regimen.  Chronic kidney disease stage IIIa: Appears to be at baseline.  Coronary artery disease status post PCI: Continue statins, metoprolol, losartan and aspirin.  Hyperlipidemia: Continue fenofibrate and statins.  Small amount of black stool: Resolved. His blood pressure likely due to hemorrhoids, he had a colonoscopy 08/31/2016 that showed thrombosed external hemorrhoids, no further signs of bleeding his hemoglobin has remained stable.  Acute confusional state: Continue Haldol IV as needed.   DVT prophylaxis: coumadin Family Communication:wife Status is: Inpatient  Remains inpatient appropriate because:Persistent severe electrolyte disturbances   Dispo: The patient is from: Home              Anticipated d/c is to: Home              Anticipated d/c date is: > 3 days              Patient currently is not medically stable to d/c.  Code Status:     Code Status Orders  (From admission, onward)         Start     Ordered   08/26/20 1045  Full code  Continuous        08/26/20 1046        Code Status History    Date Active Date Inactive Code Status Order ID Comments User Context   04/24/2020 1531 04/24/2020 2124 Full Code [id number]  [name], MD Inpatient   09/30/2017 0116 10/02/2017 1531 Full Code [id number]  [name], MD ED   04/10/2017 1748 04/14/2017 1428 Full Code [id number]  [name], MD Inpatient   01/25/2017 [phone]  01/25/2017 2220 Full Code [id number]  [name], MD Inpatient   03/02/2016 1628 03/16/2016 2023 Full Code [id number]  [name] Inpatient   02/18/2016 1408 03/02/2016 1613 Full Code [id number]  [name], MD ED   Advance Care Planning Activity        IV Access:    Peripheral IV   Procedures and diagnostic studies:   No results found.   Medical Consultants:    None.  Anti-Infectives:   none  Subjective:    BLAIRE HODSDON relates his breathing is unchanged compared to yesterday he relates his back is  bothering him a little bit.  Objective:    Vitals:   08/30/20 2039 08/31/20 0548 08/31/20 0759 08/31/20 0800  BP: (!) 114/47 (!) 125/58 (!) 123/55   Pulse: 72 69 74   Resp: 17 17 16    Temp: 97.9 F (36.6 C) 97.6 F (36.4 C) 97.8 F (36.6 C)   TempSrc: Oral Oral Oral   SpO2: (!) 85% (!) 86% (!) 77% (!) 82%  Weight:  85.6 kg    Height:       SpO2: (!) 82 % O2 Flow Rate (L/min): 11 L/min   Intake/Output Summary (Last 24 hours) at 08/31/2020 0847 Last data filed at 08/31/2020 0550 Gross per 24 hour  Intake 460 ml  Output 2125 ml  Net -1665 ml   Filed Weights   08/30/20 0036 08/30/20 0357 08/31/20 0548  Weight: 88.1 kg 88.5 kg 85.6 kg    Exam: General exam: In no acute distress. Respiratory system: Good air movement and clear to auscultation. Cardiovascular system: S1 & S2 heard, RRR. No JVD. Gastrointestinal system: Abdomen is nondistended, soft and nontender.  Extremities: No pedal edema. Skin: No rashes, lesions or ulcers  Data Reviewed:    Labs: Basic Metabolic Panel: Recent Labs  Lab 08/26/20 1200 08/26/20 1200 08/27/20 0454 08/27/20 0454 08/28/20 0500 08/28/20 0500 08/29/20 0742 08/30/20 0708  NA 142  --  141  --  141  --  141 141  K 4.3   < > 4.2   < > 3.7   < > 3.5 3.5  CL 105  --  104  --  99  --  99 100  CO2 23  --  23  --  27  --  27 29  GLUCOSE 95  --  153*  --  151*  --  131* 139*  BUN 26*  --  28*  --  32*  --  38* 49*  CREATININE 1.40*  --  1.30*  --  1.27*  --  1.43* 1.53*  CALCIUM 9.9  --  9.3  --  9.4  --  9.1 8.7*  MG  --   --  1.8  --  2.0  --  1.9 2.0  PHOS  --   --  4.2  --  4.1  --  3.8 4.1   < > = values in this interval not displayed.   GFR Estimated Creatinine Clearance: 45.1 mL/min (A) (by C-G formula based on SCr of 1.53 mg/dL (H)). Liver Function Tests: Recent Labs  Lab 08/26/20 1200 08/27/20 0454 08/28/20 0500 08/29/20 0742 08/30/20 0708  AST 37 31 28 25 20   ALT 23 22 21 18 16   ALKPHOS 42 46 48 52 47  BILITOT  1.0 0.4 0.6 0.8 0.9  PROT 6.9 6.5 6.7 6.3* 6.0*  ALBUMIN 2.8* 2.6* 2.9* 2.5* 2.3*   No results for input(s): LIPASE, AMYLASE in the last  168 hours. No results for input(s): AMMONIA in the last 168 hours. Coagulation profile Recent Labs  Lab 08/26/20 0826 08/27/20 0454 08/28/20 0832 08/29/20 0742 08/30/20 0708  INR 2.5* 3.6* 4.9* 4.3* 4.2*   COVID-19 Labs  Recent Labs    08/29/20 0742 08/29/20 1530 08/30/20 0708  DDIMER 13.99*  --  15.62*  FERRITIN  --  1,582* 1,075*  CRP  --  4.2* 6.5*    Lab Results  Component Value Date   SARSCOV2NAA POSITIVE (A) 08/26/2020   [place] NEGATIVE 04/21/2020    CBC: Recent Labs  Lab 08/25/20 1813 08/25/20 1813 08/27/20 0221 08/27/20 0454 08/28/20 0500 08/29/20 1530 08/30/20 0708  WBC 7.9  --   --  7.6 16.1* 17.2* 14.1*  NEUTROABS  --   --   --  7.0 14.6* 15.5* 12.5*  HGB 10.7*   < > 11.6* 10.5* 12.2* 11.3* 11.0*  HCT 35.0*   < > 36.8* 34.6* 38.4* 36.1* 34.6*  MCV 83.1  --   --  82.8 81.9 80.8 79.5*  PLT 332  --   --  370 402* 376 329   < > = values in this interval not displayed.   Cardiac Enzymes: No results for input(s): CKTOTAL, CKMB, CKMBINDEX, TROPONINI in the last 168 hours. BNP (last 3 results) No results for input(s): PROBNP in the last 8760 hours. CBG: Recent Labs  Lab 08/27/20 1646  GLUCAP 172*   D-Dimer: Recent Labs    08/29/20 0742 08/30/20 0708  DDIMER 13.99* 15.62*   Hgb A1c: No results for input(s): HGBA1C in the last 72 hours. Lipid Profile: No results for input(s): CHOL, HDL, LDLCALC, TRIG, CHOLHDL, LDLDIRECT in the last 72 hours. Thyroid function studies: No results for input(s): TSH, T4TOTAL, T3FREE, THYROIDAB in the last 72 hours.  Invalid input(s): FREET3 Anemia work up: Recent Labs    08/29/20 1530 08/30/20 0708  FERRITIN 1,582* 1,075*   Sepsis Labs: Recent Labs  Lab 08/25/20 1813 08/26/20 1135 08/26/20 1200 08/27/20 0454 08/28/20 0500 08/29/20 1530 08/30/20 0708    PROCALCITON  --   --  0.12  --   --   --   --   WBC   < >  --   --  7.6 16.1* 17.2* 14.1*  LATICACIDVEN  --  1.4  --   --   --   --   --    < > = values in this interval not displayed.   Microbiology Recent Results (from the past 240 hour(s))  SARS Coronavirus 2 by RT PCR (hospital order, performed in [facility] hospital lab) Nasopharyngeal Nasopharyngeal Swab     Status: Abnormal   Collection Time: 08/26/20  6:55 AM   Specimen: Nasopharyngeal Swab  Result Value Ref Range Status   SARS Coronavirus 2 POSITIVE (A) NEGATIVE Final    Comment: emailed L. Berdik RN 10:00 08/26/20 ([email or web address]) (NOTE) SARS-CoV-2 target nucleic acids are DETECTED  SARS-CoV-2 RNA is generally detectable in upper respiratory specimens  during the acute phase of infection.  Positive results are indicative  of the presence of the identified virus, but do not rule out bacterial infection or co-infection with other pathogens not detected by the test.  Clinical correlation with patient history and  other diagnostic information is necessary to determine patient infection status.  The expected result is negative.  Fact Sheet for Patients:   StrictlyIdeas.no   Fact Sheet for Healthcare Providers:   BankingDealers.co.za    This test is not yet approved or cleared  by the [place] and  has been authorized for detection and/or diagnosis of SARS-CoV-2 by FDA under an Emergency Use Authorization (EUA).  This EUA will remain in effect (meaning this test can be used) for the duration of  the  COVID-19 declaration under Section 564(b)(1) of the Act, 21 U.S.C. section 360-bbb-3(b)(1), unless the authorization is terminated or revoked sooner.  Performed at [facility], [place] [address] [place], [place] [address]   Blood Culture (routine x 2)     Status: None (Preliminary result)   Collection Time: 08/26/20 11:36 AM   Specimen: BLOOD  Result Value Ref Range  Status   Specimen Description BLOOD RIGHT ANTECUBITAL  Final   Special Requests   Final    BOTTLES DRAWN AEROBIC AND ANAEROBIC Blood Culture adequate volume   Culture   Final    NO GROWTH 4 DAYS Performed at [facility], [place] [address], [place], [place] [address]    Report Status PENDING  Incomplete  Blood Culture (routine x 2)     Status: None (Preliminary result)   Collection Time: 08/27/20  4:56 AM   Specimen: BLOOD LEFT FOREARM  Result Value Ref Range Status   Specimen Description BLOOD LEFT FOREARM  Final   Special Requests   Final    BOTTLES DRAWN AEROBIC AND ANAEROBIC Blood Culture results may not be optimal due to an inadequate volume of blood received in culture bottles   Culture   Final    NO GROWTH 3 DAYS Performed at [facility], [place] [address] [place], [place] [address]    Report Status PENDING  Incomplete     Medications:    albuterol  2 puff Inhalation Q6H   vitamin C  500 mg Oral Daily   digoxin  0.125 mg Oral Daily   fenofibrate  160 mg Oral Daily   ferrous sulfate  325 mg Oral BID   folic acid  1 mg Oral QPM   furosemide  40 mg Oral Q24H   furosemide  80 mg Oral Q24H   losartan  25 mg Oral Daily   methylPREDNISolone (SOLU-MEDROL) injection  80 mg Intravenous Q12H   metoprolol succinate  25 mg Oral Daily   pantoprazole  40 mg Oral BID   pravastatin  40 mg Oral Daily   sertraline  150 mg Oral Q1400   Warfarin - Pharmacist Dosing Inpatient   Does not apply q1600   zinc sulfate  220 mg Oral Daily   Continuous Infusions:     LOS: 5 days   Charlynne Cousins  [facility]  08/31/2020, 8:47 AM

## 2020-08-31 NOTE — Progress Notes (Signed)
RT informed of MD request for heated high flow o2 ,patient o2 sat 80-84% this am very short of breath unable to talk much with staff it makes him too short of breath this am.Meds given has received inhaler and steriod medication this am.

## 2020-09-01 ENCOUNTER — Inpatient Hospital Stay (HOSPITAL_COMMUNITY): Payer: Medicare Other

## 2020-09-01 DIAGNOSIS — R7989 Other specified abnormal findings of blood chemistry: Secondary | ICD-10-CM

## 2020-09-01 LAB — PROCALCITONIN: Procalcitonin: <0.1 ng/mL

## 2020-09-01 LAB — CULTURE, BLOOD (ROUTINE X 2): Culture: NO GROWTH

## 2020-09-01 LAB — PROTIME-INR
INR: 4 — ABNORMAL HIGH (ref 0.8–1.2)
Prothrombin Time: 38.1 seconds — ABNORMAL HIGH (ref 11.4–15.2)

## 2020-09-01 MED ORDER — HEPARIN (PORCINE) 25000 UT/250ML-% IV SOLN
1100.0000 [IU]/h | INTRAVENOUS | Status: DC
  Administered 2020-09-01: 1500 [IU]/h via INTRAVENOUS
  Administered 2020-09-05 – 2020-09-06 (×2): 900 [IU]/h via INTRAVENOUS
  Administered 2020-09-07 – 2020-09-09 (×3): 1100 [IU]/h via INTRAVENOUS
  Filled 2020-09-01 (×11): qty 250

## 2020-09-01 MED ORDER — IOHEXOL 350 MG/ML SOLN
100.0000 mL | Freq: Once | INTRAVENOUS | Status: AC | PRN
  Administered 2020-09-01: 100 mL via INTRAVENOUS

## 2020-09-01 MED ORDER — ALBUTEROL SULFATE HFA 108 (90 BASE) MCG/ACT IN AERS
2.0000 | INHALATION_SPRAY | Freq: Three times a day (TID) | RESPIRATORY_TRACT | Status: DC
  Administered 2020-09-02 – 2020-09-19 (×52): 2 via RESPIRATORY_TRACT
  Filled 2020-09-01: qty 6.7

## 2020-09-01 MED ORDER — SODIUM CHLORIDE 0.9 % IV BOLUS
250.0000 mL | Freq: Once | INTRAVENOUS | Status: AC
  Administered 2020-09-01: 250 mL via INTRAVENOUS

## 2020-09-01 NOTE — Progress Notes (Signed)
Lower Ext study completed.  ° °See CVProc for preliminary results.  ° °Adrianne Shackleton, RDMS, RVT ° °

## 2020-09-01 NOTE — Progress Notes (Signed)
[place] for warfarin Indication: atrial fibrillation  Allergies  Allergen Reactions  . Keflex [Cephalexin] Shortness Of Breath and Other (See Comments)    dizziness    Patient Measurements: Height: 5\' 10"  (177.8 cm) Weight: 85.6 kg (188 lb 11.4 oz) IBW/kg (Calculated) : 73   Vital Signs: Temp: 98.1 F (36.7 C) (09/14 1140) Temp Source: Oral (09/14 1140) BP: 114/66 (09/14 1140) Pulse Rate: 72 (09/14 1140)  Labs: Recent Labs    08/29/20 1530 08/29/20 1530 08/30/20 0708 08/31/20 1131 09/01/20 0756  HGB 11.3*   < > 11.0* 12.6*  --   HCT 36.1*  --  34.6* 39.4  --   PLT 376  --  329 339  --   LABPROT  --   --  35.7* 38.0* 38.1*  INR  --   --  4.2* 4.0* 4.0*  CREATININE  --   --  1.53* 1.42*  --    < > = values in this interval not displayed.    Estimated Creatinine Clearance: 48.6 mL/min (A) (by C-G formula based on SCr of 1.42 mg/dL (H)).   Medical History: Past Medical History:  Diagnosis Date  . AICD (automatic cardioverter/defibrillator) present   . Anemia   . Anxiety   . CHF (congestive heart failure) ([employer])   . Chronic renal insufficiency   . Coronary artery disease   . Depression   . Dyslipidemia   . Dysrhythmia    atrial fibrillation  . ED (erectile dysfunction)   . GERD (gastroesophageal reflux disease)   . Incisional hernia, without obstruction or gangrene 04/10/2017  . Ischemic cardiomyopathy    EF 23%  . MI (myocardial infarction) ([place]) 2000   ANTERIOR, s/p PCI  . Other primary cardiomyopathies   . Peripheral vascular disease ([place])    dvt legs 2017  . Persistent atrial fibrillation (HCC)    on coumadin  . Presence of permanent cardiac pacemaker   . Shortness of breath dyspnea    on exertion   Assessment: 20 YOM presenting with SOB COVID +, hx of afib on warfarin PTA.  INR therapeutic on admission at 2.5, last dose taken 9/6  PTA dosing: 3mg  daily  INR remains supratherapeutic, some down trend this  week but has been hanging at 4 the last two days.  Last dose 6 days ago, LFTs Tbili wnl.    Goal of Therapy:  INR 2-3 Monitor platelets by anticoagulation protocol: Yes   Plan:  Will continue to hold warfarin with low threshold to give small dose on evidence of further downtrend.   Monitor INR, s/s bleeding  Bertis Ruddy, PharmD Clinical Pharmacist ED Pharmacist Phone # [phone] 09/01/2020 1:13 PM

## 2020-09-01 NOTE — Progress Notes (Signed)
[facility] PROGRESS NOTE    Progress Note  [name]  BZJ:[id number] DOB: 1946-01-28 DOA: 08/25/2020 PCP: [name], MD     Brief Narrative:   [name] is an 74 y.o. male past medical history significant for nonischemic cardiomyopathy with an EF of 25% status post AICD, permanent atrial fibrillation/atrial flutter on Coumadin who is fully vaccinated in March [place] several days prior to admission started developing shortness of breath at rest with cough nasal congestion loose stools and generalized weakness.  In the ED was found tachypneic hypoxic placed on 2 L of oxygen SARS-CoV-2 PCR was positive tropes 25 D-dimer 1.6 BNP of 425 creatinine at baseline chest x-ray showed bilateral diffuse groundglass opacities asymmetric.  Assessment/Plan:   Acute respiratory failure with hypoxia secondary to Pneumonia due to COVID-19 virus His oxygen requirements are worsening, he is currently on FiO2 of 100% on 40 L rate. His inflammatory markers are trending up today. He refused Actemra and baricitinib on admission. We will go ahead and check lower extremity Doppler and CT angio of the chest to rule out a PE.  CT angio will also help to evaluate pulmonary parenchyma. He is adamant prone due to his back pain.  He refused baricitinib but not IV Actemra. Continue to encourage incentive spirometry and flutter valve. His procalcitonin remains low yield, he has remained afebrile with no leukocytosis.  Acute on chronic systolic heart failure: He is negative about 5 L.  We will go ahead and hold his Lasix and losartan will give him a 250 cc bolus of normal saline as he is getting a CT angio of the chest.  Permanent atrial fibrillation: INR therapeutic rate controlled continue Coumadin per pharmacy. Hold Coumadin INR is 4.2.  Essential hypertension: Continue metoprolol.  We will hold losartan and Lasix. Blood pressure seems to be well  controlled.  Chronic kidney disease stage IIIa: Appears to be at baseline.  Coronary artery disease status post PCI: Continue statins, metoprolol, losartan and aspirin.  Hyperlipidemia: Continue fenofibrate and statins.  Small amount of black stool: Resolved. His blood pressure likely due to hemorrhoids, he had a colonoscopy 08/31/2016 that showed thrombosed external hemorrhoids, no further signs of bleeding his hemoglobin has remained stable.  Acute confusional state: Continue Haldol IV as needed.   DVT prophylaxis: coumadin Family Communication:wife Status is: Inpatient  Remains inpatient appropriate because:Persistent severe electrolyte disturbances   Dispo: The patient is from: Home              Anticipated d/c is to: Home              Anticipated d/c date is: > 3 days              Patient currently is not medically stable to d/c.  Code Status:     Code Status Orders  (From admission, onward)         Start     Ordered   08/26/20 1045  Full code  Continuous        08/26/20 1046        Code Status History    Date Active Date Inactive Code Status Order ID Comments User Context   04/24/2020 1531 04/24/2020 2124 Full Code [id number]  [name], MD Inpatient   09/30/2017 0116 10/02/2017 1531 Full Code [id number]  [name], MD ED   04/10/2017 1748 04/14/2017 1428 Full Code [id number]  [name], MD Inpatient   01/25/2017 1411 01/25/2017  2220 Full Code [id number]  [name], MD Inpatient   03/02/2016 1628 03/16/2016 2023 Full Code [id number]  [name] Inpatient   02/18/2016 1408 03/02/2016 1613 Full Code [id number]  [name], MD ED   Advance Care Planning Activity        IV Access:    Peripheral IV   Procedures and diagnostic studies:   DG CHEST PORT 1 VIEW  Result Date: 08/31/2020 CLINICAL DATA:  Dyspnea. EXAM: PORTABLE CHEST 1 VIEW COMPARISON:  08/25/2020 FINDINGS: Right chest wall ICD is noted with leads in the right atrial  appendage, coronary sinus and right ventricle. Stable cardiomediastinal contours. Aortic atherosclerotic calcifications noted. Diffuse airspace and interstitial opacities in the left lung have increased in the interval. New, diffuse interstitial opacities within the right lung are noted on today's exam. IMPRESSION: 1. Progressive interstitial and airspace opacities throughout the left lung with new diffuse interstitial opacities in the right lung. Electronically Signed   By: Kerby Moors M.D.   On: 08/31/2020 09:36     Medical Consultants:    None.  Anti-Infectives:   none  Subjective:    [name] relates his breathing is unchanged compared to yesterday he relates his back is bothering him a little bit.  Objective:    Vitals:   08/31/20 1050 08/31/20 2050 08/31/20 2128 09/01/20 0555  BP: (!) 99/52 108/64  113/63  Pulse: 68 76  70  Resp: 17 18  18   Temp: 97.9 F (36.6 C) (!) 97.5 F (36.4 C)  97.6 F (36.4 C)  TempSrc: Oral Oral  Oral  SpO2:  92% 96% 94%  Weight:      Height:       SpO2: 94 % O2 Flow Rate (L/min): 35 L/min FiO2 (%): 100 %   Intake/Output Summary (Last 24 hours) at 09/01/2020 0954 Last data filed at 09/01/2020 0558 Gross per 24 hour  Intake 540 ml  Output 1502 ml  Net -962 ml   Filed Weights   08/30/20 0036 08/30/20 0357 08/31/20 0548  Weight: 88.1 kg 88.5 kg 85.6 kg    Exam: General exam: In no acute distress. Respiratory system: Good air movement and clear to auscultation. Cardiovascular system: S1 & S2 heard, RRR. No JVD. Gastrointestinal system: Abdomen is nondistended, soft and nontender.  Extremities: No pedal edema. Skin: No rashes, lesions or ulcers  Data Reviewed:    Labs: Basic Metabolic Panel: Recent Labs  Lab 08/27/20 0454 08/27/20 0454 08/28/20 0500 08/28/20 0500 08/29/20 0742 08/29/20 0742 08/30/20 0708 08/31/20 1131  NA 141  --  141  --  141  --  141 142  K 4.2   < > 3.7   < > 3.5   < > 3.5 3.5  CL 104   --  99  --  99  --  100 99  CO2 23  --  27  --  27  --  29 30  GLUCOSE 153*  --  151*  --  131*  --  139* 121*  BUN 28*  --  32*  --  38*  --  49* 46*  CREATININE 1.30*  --  1.27*  --  1.43*  --  1.53* 1.42*  CALCIUM 9.3  --  9.4  --  9.1  --  8.7* 8.5*  MG 1.8  --  2.0  --  1.9  --  2.0 2.2  PHOS 4.2  --  4.1  --  3.8  --  4.1 2.9   < > =  values in this interval not displayed.   GFR Estimated Creatinine Clearance: 48.6 mL/min (A) (by C-G formula based on SCr of 1.42 mg/dL (H)). Liver Function Tests: Recent Labs  Lab 08/27/20 0454 08/28/20 0500 08/29/20 0742 08/30/20 0708 08/31/20 1131  AST 31 28 25 20 24   ALT 22 21 18 16 14   ALKPHOS 46 48 52 47 55  BILITOT 0.4 0.6 0.8 0.9 0.7  PROT 6.5 6.7 6.3* 6.0* 6.0*  ALBUMIN 2.6* 2.9* 2.5* 2.3* 2.3*   No results for input(s): LIPASE, AMYLASE in the last 168 hours. No results for input(s): AMMONIA in the last 168 hours. Coagulation profile Recent Labs  Lab 08/27/20 0454 08/28/20 0832 08/29/20 0742 08/30/20 0708 08/31/20 1131  INR 3.6* 4.9* 4.3* 4.2* 4.0*   COVID-19 Labs  Recent Labs    08/29/20 1530 08/30/20 0708 08/31/20 1131  DDIMER  --  15.62* 18.38*  FERRITIN 1,582* 1,075* 1,396*  CRP 4.2* 6.5* 7.5*    Lab Results  Component Value Date   SARSCOV2NAA POSITIVE (A) 08/26/2020   [place] NEGATIVE 04/21/2020    CBC: Recent Labs  Lab 08/27/20 0454 08/28/20 0500 08/29/20 1530 08/30/20 0708 08/31/20 1131  WBC 7.6 16.1* 17.2* 14.1* 17.3*  NEUTROABS 7.0 14.6* 15.5* 12.5* 15.7*  HGB 10.5* 12.2* 11.3* 11.0* 12.6*  HCT 34.6* 38.4* 36.1* 34.6* 39.4  MCV 82.8 81.9 80.8 79.5* 80.7  PLT 370 402* 376 329 339   Cardiac Enzymes: No results for input(s): CKTOTAL, CKMB, CKMBINDEX, TROPONINI in the last 168 hours. BNP (last 3 results) No results for input(s): PROBNP in the last 8760 hours. CBG: Recent Labs  Lab 08/27/20 1646  GLUCAP 172*   D-Dimer: Recent Labs    08/30/20 0708 08/31/20 1131  DDIMER 15.62*  18.38*   Hgb A1c: No results for input(s): HGBA1C in the last 72 hours. Lipid Profile: No results for input(s): CHOL, HDL, LDLCALC, TRIG, CHOLHDL, LDLDIRECT in the last 72 hours. Thyroid function studies: No results for input(s): TSH, T4TOTAL, T3FREE, THYROIDAB in the last 72 hours.  Invalid input(s): FREET3 Anemia work up: Recent Labs    08/30/20 0708 08/31/20 1131  FERRITIN 1,075* 1,396*   Sepsis Labs: Recent Labs  Lab 08/26/20 1135 08/26/20 1200 08/27/20 0454 08/28/20 0500 08/29/20 1530 08/30/20 0708 08/31/20 1131  PROCALCITON  --  0.12  --   --   --   --  <0.10  WBC  --   --    < > 16.1* 17.2* 14.1* 17.3*  LATICACIDVEN 1.4  --   --   --   --   --   --    < > = values in this interval not displayed.   Microbiology Recent Results (from the past 240 hour(s))  SARS Coronavirus 2 by RT PCR (hospital order, performed in [facility] hospital lab) Nasopharyngeal Nasopharyngeal Swab     Status: Abnormal   Collection Time: 08/26/20  6:55 AM   Specimen: Nasopharyngeal Swab  Result Value Ref Range Status   SARS Coronavirus 2 POSITIVE (A) NEGATIVE Final    Comment: emailed L. Berdik RN 10:00 08/26/20 ([email or web address]) (NOTE) SARS-CoV-2 target nucleic acids are DETECTED  SARS-CoV-2 RNA is generally detectable in upper respiratory specimens  during the acute phase of infection.  Positive results are indicative  of the presence of the identified virus, but do not rule out bacterial infection or co-infection with other pathogens not detected by the test.  Clinical correlation with patient history and  other diagnostic information is necessary to determine patient  infection status.  The expected result is negative.  Fact Sheet for Patients:   StrictlyIdeas.no   Fact Sheet for Healthcare Providers:   BankingDealers.co.za    This test is not yet approved or cleared by the [place] FDA and  has been authorized for detection and/or  diagnosis of SARS-CoV-2 by FDA under an Emergency Use Authorization (EUA).  This EUA will remain in effect (meaning this test can be used) for the duration of  the  COVID-19 declaration under Section 564(b)(1) of the Act, 21 U.S.C. section 360-bbb-3(b)(1), unless the authorization is terminated or revoked sooner.  Performed at [facility], [place] [address] [place], [place] [address]   Blood Culture (routine x 2)     Status: None (Preliminary result)   Collection Time: 08/26/20 11:36 AM   Specimen: BLOOD  Result Value Ref Range Status   Specimen Description BLOOD RIGHT ANTECUBITAL  Final   Special Requests   Final    BOTTLES DRAWN AEROBIC AND ANAEROBIC Blood Culture adequate volume   Culture   Final    NO GROWTH 4 DAYS Performed at [facility], [place] [address], [place], [place] [address]    Report Status PENDING  Incomplete  Blood Culture (routine x 2)     Status: None   Collection Time: 08/27/20  4:56 AM   Specimen: BLOOD LEFT FOREARM  Result Value Ref Range Status   Specimen Description BLOOD LEFT FOREARM  Final   Special Requests   Final    BOTTLES DRAWN AEROBIC AND ANAEROBIC Blood Culture results may not be optimal due to an inadequate volume of blood received in culture bottles   Culture   Final    NO GROWTH 5 DAYS Performed at [facility], [place] [address], [place], [place] [address]    Report Status 09/01/2020 FINAL  Final     Medications:   . albuterol  2 puff Inhalation Q6H  . vitamin C  500 mg Oral Daily  . digoxin  0.125 mg Oral Daily  . fenofibrate  160 mg Oral Daily  . ferrous sulfate  325 mg Oral BID  . folic acid  1 mg Oral QPM  . losartan  25 mg Oral Daily  . methylPREDNISolone (SOLU-MEDROL) injection  80 mg Intravenous Q12H  . metoprolol succinate  25 mg Oral Daily  . pantoprazole  40 mg Oral BID  . pravastatin  40 mg Oral Daily  . sertraline  150 mg Oral Q1400  . Warfarin - Pharmacist Dosing Inpatient   Does not apply q1600  . zinc  sulfate  220 mg Oral Daily   Continuous Infusions:     LOS: 6 days   Charlynne Cousins  [facility]  09/01/2020, 9:54 AM

## 2020-09-01 NOTE — Progress Notes (Signed)
PT Cancellation Note  Patient Details Name: [name] MRN: [id number] DOB: 02/05/1946   Cancelled Treatment:    Reason Eval/Treat Not Completed: Medical issues which prohibited therapy Pt found to have new bil LE DVTs and chest CT ordered to eval for PE.  Will hold PT for anticoagulation per PT guidelines.  Will f/u as appropriate at later date.   Abran Baylin, PT Acute Rehab Services Pager [phone] [facility] [phone]    Karlton Lemon 09/01/2020, 2:03 PM

## 2020-09-01 NOTE — Progress Notes (Signed)
ANTICOAGULATION CONSULT NOTE - Initial Consult  Pharmacy Consult for heparin Indication: DVT and r/o PE  Allergies  Allergen Reactions  . Keflex [Cephalexin] Shortness Of Breath and Other (See Comments)    dizziness    Patient Measurements: Height: 5\' 10"  (177.8 cm) Weight: 85.6 kg (188 lb 11.4 oz) IBW/kg (Calculated) : 73 Heparin Dosing Weight: 85 kg  Vital Signs: Temp: 98.5 F (36.9 C) (09/14 2044) Temp Source: Oral (09/14 2044) BP: 118/55 (09/14 2044) Pulse Rate: 70 (09/14 2044)  Labs: Recent Labs    08/30/20 0708 08/31/20 1131 09/01/20 0756  HGB 11.0* 12.6*  --   HCT 34.6* 39.4  --   PLT 329 339  --   LABPROT 35.7* 38.0* 38.1*  INR 4.2* 4.0* 4.0*  CREATININE 1.53* 1.42*  --     Estimated Creatinine Clearance: 48.6 mL/min (A) (by C-G formula based on SCr of 1.42 mg/dL (H)).  Assessment: 74 yo m presenting with SOB, cough, found to be covid+. Patient fully vaccinated.  PMH NICM EF 25 s/p AICD, permanent afib on warfarin  Today with increasing oxygen requirements, now up to 40L HFNC Inflammatory markers up including CRP 7.5, Ferritin 1396 On admission Ddimer was 10.67 (9/8), then climbed to > 20 for 2 days. Now 18.38 LE dopplers were done today with BL acute DVT, now pending CTA  Patient is on warfarin PTA - 3 mg daily. INR day prior to admit as outpatient was 2.5 Admission INR was 2.5 >> 3.6 on hospital day 2, then was above 4 ever since (last warfarin dose was 9/8) CBC has been stable since admit, no bleeding  Discussed with Dr Aileen Fass the fact patient has now had acute DVT and likely PE while with a supra therapeutic INR. He was concerned this was acute phase reactant associated with COVID (since all other markers going up). No change in LFTs. Could also just be warfarin failure in setting of COVID associated thrombosis.  Goal of Therapy:  Heparin level 0.3-0.7 units/ml Monitor platelets by anticoagulation protocol: Yes   Plan:  Start heparin 1500  units/hr (discussed with MD to do normal dosing except no bolus to start d/t INR) Initial hep lvl 0100 Daily hep lvl cbc Will continue to check INR daily to ensure no acute elevations even higher. F/U CTA and keep close watch on any bleeding. Will likely need to discuss if ok to return to warfarin (Dc consult for now) vs consideration of DOAC   Barth Kirks, PharmD, BCPS, BCCCP Clinical Pharmacist [phone]  Please check AMION for all [facility] numbers  09/01/2020 9:02 PM

## 2020-09-01 NOTE — Progress Notes (Signed)
Pt needs frequent encouragement to increase use of incentive spirometer while awake. This RN educated patient purpose of I.S. Patient verbalized understanding.  Call light in reach. Will con't to monitor.

## 2020-09-02 LAB — BASIC METABOLIC PANEL
Anion gap: 13 (ref 5–15)
BUN: 41 mg/dL — ABNORMAL HIGH (ref 8–23)
CO2: 27 mmol/L (ref 22–32)
Calcium: 8.4 mg/dL — ABNORMAL LOW (ref 8.9–10.3)
Chloride: 98 mmol/L (ref 98–111)
Creatinine, Ser: 1.17 mg/dL (ref 0.61–1.24)
GFR calc Af Amer: >60 mL/min (ref >60)
GFR calc non Af Amer: >60 mL/min (ref >60)
Glucose, Bld: 87 mg/dL (ref 70–99)
Potassium: 3.8 mmol/L (ref 3.5–5.1)
Sodium: 138 mmol/L (ref 135–145)

## 2020-09-02 LAB — CBC
HCT: 36.5 % — ABNORMAL LOW (ref 39.0–52.0)
Hemoglobin: 11.2 g/dL — ABNORMAL LOW (ref 13.0–17.0)
MCH: 25.3 pg — ABNORMAL LOW (ref 26.0–34.0)
MCHC: 30.7 g/dL (ref 30.0–36.0)
MCV: 82.4 fL (ref 80.0–100.0)
Platelets: 268 10*3/uL (ref 150–400)
RBC: 4.43 MIL/uL (ref 4.22–5.81)
RDW: 15.8 % — ABNORMAL HIGH (ref 11.5–15.5)
WBC: 15.3 10*3/uL — ABNORMAL HIGH (ref 4.0–10.5)
nRBC: 0 % (ref 0.0–0.2)

## 2020-09-02 LAB — PROTIME-INR
INR: 4.8 (ref 0.8–1.2)
Prothrombin Time: 43.6 seconds — ABNORMAL HIGH (ref 11.4–15.2)

## 2020-09-02 LAB — CULTURE, BLOOD (ROUTINE X 2)
Culture: NO GROWTH
Special Requests: ADEQUATE

## 2020-09-02 LAB — C-REACTIVE PROTEIN: CRP: 7.2 mg/dL — ABNORMAL HIGH (ref <1.0)

## 2020-09-02 LAB — HEPARIN LEVEL (UNFRACTIONATED)
Heparin Unfractionated: 0.7 IU/mL (ref 0.30–0.70)
Heparin Unfractionated: 0.96 IU/mL — ABNORMAL HIGH (ref 0.30–0.70)

## 2020-09-02 LAB — D-DIMER, QUANTITATIVE: D-Dimer, Quant: 7.95 ug/mL-FEU — ABNORMAL HIGH (ref 0.00–0.50)

## 2020-09-02 LAB — PROCALCITONIN: Procalcitonin: <0.1 ng/mL

## 2020-09-02 LAB — GLUCOSE, CAPILLARY: Glucose-Capillary: 99 mg/dL (ref 70–99)

## 2020-09-02 MED ORDER — SALINE SPRAY 0.65 % NA SOLN
1.0000 | NASAL | Status: DC | PRN
  Administered 2020-09-03 – 2020-09-04 (×3): 1 via NASAL
  Filled 2020-09-02: qty 44

## 2020-09-02 MED ORDER — FLUCONAZOLE IN SODIUM CHLORIDE 200-0.9 MG/100ML-% IV SOLN
200.0000 mg | Freq: Once | INTRAVENOUS | Status: AC
  Administered 2020-09-02: 200 mg via INTRAVENOUS
  Filled 2020-09-02 (×2): qty 100

## 2020-09-02 MED ORDER — FLUCONAZOLE 100MG IVPB
100.0000 mg | INTRAVENOUS | Status: DC
  Filled 2020-09-02: qty 50

## 2020-09-02 MED ORDER — BIOTENE DRY MOUTH MT LIQD: 15.0000 mL | OROMUCOSAL | Status: DC | PRN

## 2020-09-02 MED ORDER — FLUCONAZOLE 100MG IVPB
100.0000 mg | INTRAVENOUS | Status: AC
  Administered 2020-09-03 – 2020-09-08 (×6): 100 mg via INTRAVENOUS
  Filled 2020-09-02 (×6): qty 50

## 2020-09-02 MED ORDER — TRAMADOL-ACETAMINOPHEN 37.5-325 MG PO TABS
1.0000 | ORAL_TABLET | Freq: Four times a day (QID) | ORAL | Status: DC | PRN
  Administered 2020-09-02 – 2020-09-27 (×6): 1 via ORAL
  Filled 2020-09-02 (×6): qty 1

## 2020-09-02 NOTE — Progress Notes (Signed)
[facility] PROGRESS NOTE    Progress Note  [name]  TLX:[id number] DOB: [birth year] DOA: 08/25/2020 PCP: [name], MD     Brief Narrative:   [name] is an 74 y.o. male past medical history significant for nonischemic cardiomyopathy with an EF of 25% status post AICD, permanent atrial fibrillation/atrial flutter on Coumadin who is fully vaccinated in March went [place] several days prior to admission started developing shortness of breath at rest with cough nasal congestion loose stools and generalized weakness.  In the ED was found tachypneic hypoxic placed on 2 L of oxygen SARS-CoV-2 PCR was positive tropes 25 D-dimer 1.6 BNP of 425 creatinine at baseline chest x-ray showed bilateral diffuse groundglass opacities asymmetric.  Assessment/Plan:   Acute respiratory failure with hypoxia secondary to Pneumonia due to COVID-19 virus He is now requiring 35 L of oxygen to keep saturations greater than 90% with an FiO2 of 100. Inflammatory markers are significantly elevated and are pending this am. Lower extremity Dopplers was positive for bilateral DVTs, CT angio of the chest showed no large PE but cannot evaluate subsegmental PEs.  He does have significant diffuse pulmonary infiltrates with hilar adenopathy. Has refused Actemra and baricitinib. Unable to prone due to back pain.  Continues into spirometry and flutter valve. Get an overlay mattress, Out of bed to chair. His procalcitonin has remained low yield his leukocytosis is improving, its elevated likely due to steroids. Basic metabolic panel is pending this morning.  Acute on chronic systolic heart failure: This morning he is negative about 6 L continue to hold Lasix and losartan.  He appears euvolemic on physical exam. Basic metabolic panel is pending.  New bilateral lower extremity DVT: CT angio of the chest did not showed large PE but was unable to evaluate subsegmental PE due to  contrast timing. His INR is 4, with an elevated CRP, D-dimer of 18, and a ferritin of 1000, likely his D-dimer and INR are  acting as an acute phase reactant. We will go ahead and discontinue Coumadin and start him on IV heparin.  I had a long desiccation with the patient and wife on 09/01/2020 about the risk and benefits of starting Coumadin while his supratherapeutic INR was falsely elevated they seem to understand the risk and benefits and the agreed to proceed with IV heparin.  Permanent atrial fibrillation: INR therapeutic rate controlled, discontinue Coumadin and start IV heparin.  Essential hypertension: Continue metoprolol.  We will hold losartan and Lasix. Blood pressure seems to be well controlled.  Chronic kidney disease stage IIIa: Appears to be at baseline.  Coronary artery disease status post PCI: Continue statins, metoprolol, losartan and aspirin.  Hyperlipidemia: Continue fenofibrate and statins.  Small amount of black stool: Resolved. His blood pressure likely due to hemorrhoids, he had a colonoscopy 08/31/2016 that showed thrombosed external hemorrhoids, no further signs of bleeding his hemoglobin has remained stable.  Acute confusional state: Continue Haldol IV as needed.  Oral thrush: Started him IV Diflucan for 7 days.   DVT prophylaxis: coumadin Family Communication:wife Status is: Inpatient  Remains inpatient appropriate because:Persistent severe electrolyte disturbances   Dispo: The patient is from: Home              Anticipated d/c is to: Home              Anticipated d/c date is: > 3 days              Patient currently  is not medically stable to d/c. once he is on 4L of oxygen.  Code Status:     Code Status Orders  (From admission, onward)         Start     Ordered   08/26/20 1045  Full code  Continuous        08/26/20 1046        Code Status History    Date Active Date Inactive Code Status Order ID Comments User Context   04/24/2020  1531 04/24/2020 2124 Full Code [id number]  [name], MD Inpatient   09/30/2017 0116 10/02/2017 1531 Full Code [id number]  [name], MD ED   04/10/2017 1748 04/14/2017 1428 Full Code [id number]  [name], MD Inpatient   01/25/2017 1411 01/25/2017 2220 Full Code [id number]  [name], MD Inpatient   03/02/2016 1628 03/16/2016 2023 Full Code [id number]  [name] Inpatient   02/18/2016 1408 03/02/2016 1613 Full Code [id number]  [name], MD ED   Advance Care Planning Activity        IV Access:    Peripheral IV   Procedures and diagnostic studies:   CT ANGIO CHEST PE W OR WO CONTRAST  Result Date: 09/01/2020 CLINICAL DATA:  COVID positive, positive D-dimer, PE suspected. EXAM: CT ANGIOGRAPHY CHEST WITH CONTRAST TECHNIQUE: Multidetector CT imaging of the chest was performed using the standard protocol during bolus administration of intravenous contrast. Multiplanar CT image reconstructions and MIPs were obtained to evaluate the vascular anatomy. CONTRAST:  161mL OMNIPAQUE IOHEXOL 350 MG/ML SOLN COMPARISON:  Chest x-ray 08/31/2020, CT angio chest 02/22/2016 FINDINGS: Lines: Right chest wall three lead cardiac defibrillator and pacemaker. Cardiovascular: Satisfactory opacification of the pulmonary arteries to the segmental level. No evidence of pulmonary embolism. Prominent right atrium and left ventricle. No significant pericardial effusion. At least moderate 4 vessel coronary artery calcifications. 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 Mediastinum/Nodes: Palpable of prominent, likely reactive mediastinal lymph nodes with as an example a 1.2 cm right paratracheal lymph node (6:101) and a 1 cm precarinal lymph node on the left (6:109). There is suggestion of a 1 cm right infrahilar lymph node (6:159). As well as suggestion of a prominent left infrahilar lymph  node (05/29/2040). No axillary lymph nodes. Thyroid gland, trachea, and esophagus demonstrate no significant findings. Lungs/Pleura: Diffuse, bilateral lower lobe and left upper lobe predominant, peripheral and peribronchovascular patchy and consolidative airspace opacities. No pleural effusion or pneumothorax bilaterally. Upper Abdomen: Partially visualized 4.1 cm fluid density lesion within the left kidney likely represents a renal cyst. Musculoskeletal: No chest wall abnormality No suspicious lytic or blastic osseous lesions. No acute displaced fracture. Multilevel degenerative changes of the spine. Review of the MIP images confirms the above findings. IMPRESSION: 1. No central or segmental pulmonary embolus. Limited evaluation of the subsegmental level due to timing of contrast. 2. Diffuse pulmonary findings consistent with COVID infection with likely reactive infrahilar and mediastinal lymphadenopathy. 3. 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 4.  Aortic Atherosclerosis (ICD10-I70.0). Electronically Signed   By: Iven Finn M.D.   On: 09/01/2020 21:51   DG CHEST PORT 1 VIEW  Result Date: 08/31/2020 CLINICAL DATA:  Dyspnea. EXAM: PORTABLE CHEST 1 VIEW COMPARISON:  08/25/2020 FINDINGS: Right chest wall ICD is noted with leads in the right atrial appendage, coronary sinus and right ventricle. Stable cardiomediastinal contours. Aortic atherosclerotic calcifications  noted. Diffuse airspace and interstitial opacities in the left lung have increased in the interval. New, diffuse interstitial opacities within the right lung are noted on today's exam. IMPRESSION: 1. Progressive interstitial and airspace opacities throughout the left lung with new diffuse interstitial opacities in the right lung. Electronically Signed   By: Kerby Moors M.D.   On: 08/31/2020 09:36   VAS [place] LOWER EXTREMITY VENOUS (DVT)  Result Date: 09/01/2020  Lower Venous  DVTStudy Indications: Elevated D dimer.  Risk Factors: + Covid. Performing Technologist: Griffin Basil RCT RDMS  Examination Guidelines: A complete evaluation includes B-mode imaging, spectral Doppler, color Doppler, and power Doppler as needed of all accessible portions of each vessel. Bilateral testing is considered an integral part of a complete examination. Limited examinations for reoccurring indications may be performed as noted. The reflux portion of the exam is performed with the patient in reverse Trendelenburg.  +---------+---------------+---------+-----------+----------+--------------+ RIGHT    CompressibilityPhasicitySpontaneityPropertiesThrombus Aging +---------+---------------+---------+-----------+----------+--------------+ CFV      Full           Yes      Yes                                 +---------+---------------+---------+-----------+----------+--------------+ SFJ      Full                                                        +---------+---------------+---------+-----------+----------+--------------+ FV Prox  Full                                                        +---------+---------------+---------+-----------+----------+--------------+ FV Mid   Full                                                        +---------+---------------+---------+-----------+----------+--------------+ FV DistalFull                                                        +---------+---------------+---------+-----------+----------+--------------+ PFV      Full                                                        +---------+---------------+---------+-----------+----------+--------------+ POP      Full           Yes      Yes                                 +---------+---------------+---------+-----------+----------+--------------+ PTV      Full                                                         +---------+---------------+---------+-----------+----------+--------------+  PERO     None                                         Acute          +---------+---------------+---------+-----------+----------+--------------+   +---------+---------------+---------+-----------+----------+--------------+ LEFT     CompressibilityPhasicitySpontaneityPropertiesThrombus Aging +---------+---------------+---------+-----------+----------+--------------+ CFV      Full           Yes      Yes                                 +---------+---------------+---------+-----------+----------+--------------+ SFJ      Full                                                        +---------+---------------+---------+-----------+----------+--------------+ FV Prox  Full                                                        +---------+---------------+---------+-----------+----------+--------------+ FV Mid   Full                                                        +---------+---------------+---------+-----------+----------+--------------+ FV DistalFull                                                        +---------+---------------+---------+-----------+----------+--------------+ PFV      Full                                                        +---------+---------------+---------+-----------+----------+--------------+ POP      Full           Yes      Yes                                 +---------+---------------+---------+-----------+----------+--------------+ PTV      Full                                                        +---------+---------------+---------+-----------+----------+--------------+ PERO     None                                         Acute          +---------+---------------+---------+-----------+----------+--------------+  Summary: RIGHT: - Findings consistent with acute deep vein thrombosis involving the right peroneal veins. - No cystic  structure found in the popliteal fossa.  LEFT: - Findings consistent with acute deep vein thrombosis involving the left peroneal veins. - No cystic structure found in the popliteal fossa.  *See table(s) above for measurements and observations. Electronically signed by Curt Jews MD on 09/01/2020 at 5:00:44 PM.    Final      Medical Consultants:    None.  Anti-Infectives:   none  Subjective:    [name] he relates his breathing unchanged compared to yesterday.  Objective:    Vitals:   09/01/20 2009 09/01/20 2044 09/02/20 0138 09/02/20 0508  BP:  (!) 118/55  122/67  Pulse: 70 70 78 (!) 55  Resp: 18 20 20 20   Temp:  98.5 F (36.9 C)  97.6 F (36.4 C)  TempSrc:  Oral  Oral  SpO2: 94% 93% 94% 92%  Weight:    87.5 kg  Height:       SpO2: 92 % O2 Flow Rate (L/min): 35 L/min FiO2 (%): 100 %   Intake/Output Summary (Last 24 hours) at 09/02/2020 0753 Last data filed at 09/02/2020 0500 Gross per 24 hour  Intake 599 ml  Output 1775 ml  Net -1176 ml   Filed Weights   08/30/20 0357 08/31/20 0548 09/02/20 0508  Weight: 88.5 kg 85.6 kg 87.5 kg    Exam: General exam: In no acute distress. Respiratory system: Good air movement and clear to auscultation. Cardiovascular system: S1 & S2 heard, RRR. No JVD. Gastrointestinal system: Abdomen is nondistended, soft and nontender.  Extremities: No pedal edema. Skin: No rashes, lesions or ulcers  Data Reviewed:    Labs: Basic Metabolic Panel: Recent Labs  Lab 08/27/20 0454 08/27/20 0454 08/28/20 0500 08/28/20 0500 08/29/20 0742 08/29/20 0742 08/30/20 0708 08/31/20 1131  NA 141  --  141  --  141  --  141 142  K 4.2   < > 3.7   < > 3.5   < > 3.5 3.5  CL 104  --  99  --  99  --  100 99  CO2 23  --  27  --  27  --  29 30  GLUCOSE 153*  --  151*  --  131*  --  139* 121*  BUN 28*  --  32*  --  38*  --  49* 46*  CREATININE 1.30*  --  1.27*  --  1.43*  --  1.53* 1.42*  CALCIUM 9.3  --  9.4  --  9.1  --  8.7* 8.5*  MG  1.8  --  2.0  --  1.9  --  2.0 2.2  PHOS 4.2  --  4.1  --  3.8  --  4.1 2.9   < > = values in this interval not displayed.   GFR Estimated Creatinine Clearance: 48.6 mL/min (A) (by C-G formula based on SCr of 1.42 mg/dL (H)). Liver Function Tests: Recent Labs  Lab 08/27/20 0454 08/28/20 0500 08/29/20 0742 08/30/20 0708 08/31/20 1131  AST 31 28 25 20 24   ALT 22 21 18 16 14   ALKPHOS 46 48 52 47 55  BILITOT 0.4 0.6 0.8 0.9 0.7  PROT 6.5 6.7 6.3* 6.0* 6.0*  ALBUMIN 2.6* 2.9* 2.5* 2.3* 2.3*   No results for input(s): LIPASE, AMYLASE in the last 168 hours. No results for input(s): AMMONIA in the last 168 hours. Coagulation profile Recent  Labs  Lab 08/28/20 0832 08/29/20 0742 08/30/20 0708 08/31/20 1131 09/01/20 0756  INR 4.9* 4.3* 4.2* 4.0* 4.0*   COVID-19 Labs  Recent Labs    08/31/20 1131  DDIMER 18.38*  FERRITIN 1,396*  CRP 7.5*    Lab Results  Component Value Date   SARSCOV2NAA POSITIVE (A) 08/26/2020   [place] NEGATIVE 04/21/2020    CBC: Recent Labs  Lab 08/27/20 0454 08/27/20 0454 08/28/20 0500 08/29/20 1530 08/30/20 0708 08/31/20 1131 09/02/20 0030  WBC 7.6   < > 16.1* 17.2* 14.1* 17.3* 15.3*  NEUTROABS 7.0  --  14.6* 15.5* 12.5* 15.7*  --   HGB 10.5*   < > 12.2* 11.3* 11.0* 12.6* 11.2*  HCT 34.6*   < > 38.4* 36.1* 34.6* 39.4 36.5*  MCV 82.8   < > 81.9 80.8 79.5* 80.7 82.4  PLT 370   < > 402* 376 329 339 268   < > = values in this interval not displayed.   Cardiac Enzymes: No results for input(s): CKTOTAL, CKMB, CKMBINDEX, TROPONINI in the last 168 hours. BNP (last 3 results) No results for input(s): PROBNP in the last 8760 hours. CBG: Recent Labs  Lab 08/27/20 1646  GLUCAP 172*   D-Dimer: Recent Labs    08/31/20 1131  DDIMER 18.38*   Hgb A1c: No results for input(s): HGBA1C in the last 72 hours. Lipid Profile: No results for input(s): CHOL, HDL, LDLCALC, TRIG, CHOLHDL, LDLDIRECT in the last 72 hours. Thyroid function  studies: No results for input(s): TSH, T4TOTAL, T3FREE, THYROIDAB in the last 72 hours.  Invalid input(s): FREET3 Anemia work up: Recent Labs    08/31/20 1131  FERRITIN 1,396*   Sepsis Labs: Recent Labs  Lab 08/26/20 1135 08/26/20 1200 08/27/20 0454 08/29/20 1530 08/30/20 0708 08/31/20 1131 09/01/20 0756 09/02/20 0030  PROCALCITON  --  0.12  --   --   --  <0.10 <0.10  --   WBC  --   --    < > 17.2* 14.1* 17.3*  --  15.3*  LATICACIDVEN 1.4  --   --   --   --   --   --   --    < > = values in this interval not displayed.   Microbiology Recent Results (from the past 240 hour(s))  SARS Coronavirus 2 by RT PCR (hospital order, performed in [facility] hospital lab) Nasopharyngeal Nasopharyngeal Swab     Status: Abnormal   Collection Time: 08/26/20  6:55 AM   Specimen: Nasopharyngeal Swab  Result Value Ref Range Status   SARS Coronavirus 2 POSITIVE (A) NEGATIVE Final    Comment: emailed L. Berdik RN 10:00 08/26/20 ([email or web address]) (NOTE) SARS-CoV-2 target nucleic acids are DETECTED  SARS-CoV-2 RNA is generally detectable in upper respiratory specimens  during the acute phase of infection.  Positive results are indicative  of the presence of the identified virus, but do not rule out bacterial infection or co-infection with other pathogens not detected by the test.  Clinical correlation with patient history and  other diagnostic information is necessary to determine patient infection status.  The expected result is negative.  Fact Sheet for Patients:   StrictlyIdeas.no   Fact Sheet for Healthcare Providers:   BankingDealers.co.za    This test is not yet approved or cleared by the [place] FDA and  has been authorized for detection and/or diagnosis of SARS-CoV-2 by FDA under an Emergency Use Authorization (EUA).  This EUA will remain in effect (meaning this test can be  used) for the duration of  the  COVID-19 declaration under  Section 564(b)(1) of the Act, 21 U.S.C. section 360-bbb-3(b)(1), unless the authorization is terminated or revoked sooner.  Performed at [facility], [place] [address], [place], [place] [address]   Blood Culture (routine x 2)     Status: None (Preliminary result)   Collection Time: 08/26/20 11:36 AM   Specimen: BLOOD  Result Value Ref Range Status   Specimen Description BLOOD RIGHT ANTECUBITAL  Final   Special Requests   Final    BOTTLES DRAWN AEROBIC AND ANAEROBIC Blood Culture adequate volume   Culture   Final    NO GROWTH 4 DAYS Performed at [facility], [place] [address], [place], [place] [address]    Report Status PENDING  Incomplete  Blood Culture (routine x 2)     Status: None   Collection Time: 08/27/20  4:56 AM   Specimen: BLOOD LEFT FOREARM  Result Value Ref Range Status   Specimen Description BLOOD LEFT FOREARM  Final   Special Requests   Final    BOTTLES DRAWN AEROBIC AND ANAEROBIC Blood Culture results may not be optimal due to an inadequate volume of blood received in culture bottles   Culture   Final    NO GROWTH 5 DAYS Performed at [facility], [place] [address] [place], [place] [address]    Report Status 09/01/2020 FINAL  Final     Medications:   . albuterol  2 puff Inhalation TID  . vitamin C  500 mg Oral Daily  . digoxin  0.125 mg Oral Daily  . fenofibrate  160 mg Oral Daily  . ferrous sulfate  325 mg Oral BID  . folic acid  1 mg Oral QPM  . methylPREDNISolone (SOLU-MEDROL) injection  80 mg Intravenous Q12H  . metoprolol succinate  25 mg Oral Daily  . pantoprazole  40 mg Oral BID  . pravastatin  40 mg Oral Daily  . sertraline  150 mg Oral Q1400  . zinc sulfate  220 mg Oral Daily   Continuous Infusions: . heparin 1,500 Units/hr (09/01/20 1844)      LOS: 7 days   Charlynne Cousins  [facility]  09/02/2020, 7:53 AM

## 2020-09-02 NOTE — Progress Notes (Signed)
[place] for heparin Indication: DVT and r/o PE  Allergies  Allergen Reactions  . Keflex [Cephalexin] Shortness Of Breath and Other (See Comments)    dizziness    Patient Measurements: Height: 5\' 10"  (177.8 cm) Weight: 87.5 kg (192 lb 14.4 oz) IBW/kg (Calculated) : 73 Heparin Dosing Weight: 85 kg  Vital Signs: Temp: 98.1 F (36.7 C) (09/15 1118) Temp Source: Oral (09/15 1118) BP: 97/50 (09/15 1118) Pulse Rate: 71 (09/15 1118)  Labs: Recent Labs    08/31/20 1131 09/01/20 0756 09/02/20 0030 09/02/20 0857 09/02/20 1025  HGB 12.6*  --  11.2*  --   --   HCT 39.4  --  36.5*  --   --   PLT 339  --  268  --   --   LABPROT 38.0* 38.1* 43.6*  --   --   INR 4.0* 4.0* 4.8*  --   --   HEPARINUNFRC  --   --   --   --  0.70  CREATININE 1.42*  --   --  1.17  --     Estimated Creatinine Clearance: 58.9 mL/min (by C-G formula based on SCr of 1.17 mg/dL).  Assessment: 74 yo m presenting with SOB, cough, found to be covid+. Patient fully vaccinated.  PMH NICM EF 25 s/p AICD, permanent afib on warfarin  9/14 with increasing oxygen requirements, now up to 40L HFNC Inflammatory markers up including CRP 7.5, Ferritin 1396 On admission Ddimer was 10.67 (9/8), then climbed to > 20 for 2 days. Now 18.38 LE dopplers were done today with BL acute DVT, now pending CTA  Patient is on warfarin PTA - 3 mg daily. INR day prior to admit as outpatient was 2.5 Admission INR was 2.5 >> 3.6 on hospital day 2, then was above 4 ever since (last warfarin dose was 9/8) CBC has been stable since admit, no bleeding  Discussed with Dr Aileen Fass the fact patient has now had acute DVT and likely PE while with a supra therapeutic INR. He was concerned this was acute phase reactant associated with COVID (since all other markers going up). No change in LFTs. Could also just be warfarin failure in setting of COVID associated thrombosis.  Heparin started yesterday, level  at upper end of goal this morning (0.7). INR has continued to trend up, but continue to question the reliability of it in setting of covid and VTE. Hgb down slightly from 12 to 11.2 this morning. Plt count within normal limit. No bleeding issues noted. Will adjust heparin rate down to keep within range and continue to hold warfarin.   Goal of Therapy:  Heparin level 0.3-0.7 units/ml Monitor platelets by anticoagulation protocol: Yes   Plan:  Reduce heparin to 1400 units/hr, will recheck tonight to ensure we are still within range.  Daily hep lvl cbc Will continue to check INR daily to ensure no acute elevations even higher. Will likely need to discuss if ok to return to warfarin (Dc consult for now) vs consideration of [place] PharmD., BCPS Clinical Pharmacist 09/02/2020 12:15 PM

## 2020-09-02 NOTE — Progress Notes (Signed)
Bag of blood draws found between the railing and wall outside the room. Labs were drawn at [facility] 09/02/2020 for pt and accidentally forgotten. Lab was notified and stated to send all blood draws via tube system, however the Heparin level will have to be redrawn, as the time limit is 2 hrs post draw. Phlebotomist, Caren Griffins was notified on number [id number] that immediate Heparin level needed to be collected. She stated she will be here asap.

## 2020-09-02 NOTE — Progress Notes (Signed)
[name] for Heparin Indication: DVT and r/o PE  Allergies  Allergen Reactions  . Keflex [Cephalexin] Shortness Of Breath and Other (See Comments)    dizziness    Patient Measurements: Height: 5\' 10"  (177.8 cm) Weight: 87.5 kg (192 lb 14.4 oz) IBW/kg (Calculated) : 73 Heparin Dosing Weight: 85 kg  Vital Signs: Temp: 98.1 F (36.7 C) (09/15 1118) Temp Source: Oral (09/15 1118) BP: 97/50 (09/15 1118) Pulse Rate: 70 (09/15 1452)  Labs: Recent Labs    08/31/20 1131 09/01/20 0756 09/02/20 0030 09/02/20 0857 09/02/20 1025 09/02/20 1816  HGB 12.6*  --  11.2*  --   --   --   HCT 39.4  --  36.5*  --   --   --   PLT 339  --  268  --   --   --   LABPROT 38.0* 38.1* 43.6*  --   --   --   INR 4.0* 4.0* 4.8*  --   --   --   HEPARINUNFRC  --   --   --   --  0.70 0.96*  CREATININE 1.42*  --   --  1.17  --   --     Estimated Creatinine Clearance: 58.9 mL/min (by C-G formula based on SCr of 1.17 mg/dL).  Assessment: 74 yr old male presented with SOB, cough, found to be covid+ (pt is fully vaccinated).  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 On admission, D-dimer was 10.67 (9/8), then climbed to > 20 for 2 days, now down to 7.95. LE dopplers were done on 9/14 with BL acute DVT; CTA:  no central or segmental pulmonary embolus, unable to   Patient is on warfarin 3 mg daily PTA. INR day prior to admit as outpatient was 2.5. Admission INR was 2.5 >> 3.6 on hospital day 2, then was above 4 ever since (last warfarin dose was 9/8) CBC has been stable since admission; no bleeding issues.  Pharmacy discussed with Dr Aileen Fass the fact patient has now had acute DVT while with a supra therapeutic INR. He was concerned this was acute phase reactant associated with COVID (since all other markers going up). No change in  LFTs. Could also just be warfarin failure in setting of COVID associated thrombosis.  Heparin was started yesterday, with heparin level at upper end of goal this morning (0.7 units/ml). INR has continued to trend up, but continue to question the reliability in the setting of COVID and VTE. Hgb down slightly from 12 to 11.2 this morning. Plt count within normal limit.   Heparin level ~5.5 hrs after heparin infusion was decreased to 1400 units/hr was 0.96 units/ml, which is above the goal range for this pt, despite reduction in heparin infusion rate. Per RN, no issues with IV; pt has had small amt of bleeding from hemorrhoids.  Goal of Therapy:  Heparin level 0.3-0.7 units/ml Monitor platelets by anticoagulation protocol: Yes   Plan:  Reduce heparin infusion to 1250 units/hr Check 6-hr heparin level Monitor daily heparin level, CBC Continue to hold warfarin and check INR daily to ensure no acute elevations even higher Will likely need to discuss if ok to return to warfarin (D/C consult for now) vs consideration of DOAC   Gillermina Hu, PharmD, BCPS, [facility] Clinical Pharmacist 09/02/2020 7:05 PM

## 2020-09-02 NOTE — Progress Notes (Signed)
Pt is up in chair. Pt stable. PT helped get pt to bed. Mild desat noted, quick recovery. No sign of distress noted. Pt did asked to be strapped to the chair, he was told absolutely not and was encouraged to stay in the chair until dinner time. No other needs or concerns at this time.

## 2020-09-02 NOTE — Progress Notes (Signed)
Physical Therapy Treatment Patient Details Name: [name] MRN: [id number] DOB: 04-03-1946 Today's Date: 09/02/2020    History of Present Illness [name] is a 74 y.o. male with medical history significant of nonischemic cardiomyopathy with ejection fraction of 25-30%, status post ICD placement, permanent A. fib/atrial flutter-on Coumadin, hypertension, hyperlipidemia, depression/anxiety, GERD, presents to emergency department with worsening shortness of breath and cough since 1 week. Tested pos for covid.     PT Comments    Pt admitted with above diagnosis. Pt was able to pivot to recliner from bed with min to mod assist for safety. Pt on HF oxygen machine with sats 85% with activity and does not tolerate much without rest breaks. Once pt transferred and did 2 exercises he stated he could do no more.  May need SNF if wife cannot provide 24 min assist at home.  Pt currently with functional limitations due to balance and endurance deficits. Pt will benefit from skilled PT to increase their independence and safety with mobility to allow discharge to the venue listed below.     Follow Up Recommendations  Home health PT;Supervision - Intermittent     Equipment Recommendations  None recommended by PT    Recommendations for Other Services       Precautions / Restrictions Precautions Precautions: Fall Restrictions Weight Bearing Restrictions: No    Mobility  Bed Mobility Overal bed mobility: Needs Assistance Bed Mobility: Supine to Sit     Supine to sit: Mod assist;+2 for physical assistance     General bed mobility comments: Needed mod assist with pt pulling up on therapist  Transfers Overall transfer level: Needs assistance Equipment used: Rolling walker (2 wheeled) Transfers: Sit to/from Stand Sit to Stand: Mod assist;+2 physical assistance;From elevated surface Stand pivot transfers: Min assist;Mod assist;+2 safety/equipment;From elevated surface        General transfer comment: Mod assist to power up.  min A to steady and increased time needed. Performed 3x from bed for strengthening. Pivoted to therecliner and stood to be cleaned as he has a small BM in the bed.  Desat to 85% with transfer only.  Pt rested and did 10 reps of LE exercise and said he was "done" .  Ambulation/Gait                 Stairs             Wheelchair Mobility    Modified Rankin (Stroke Patients Only)       Balance Overall balance assessment: Needs assistance Sitting-balance support: Feet supported;No upper extremity supported Sitting balance-[name] Scale: Fair     Standing balance support: No upper extremity supported;During functional activity Standing balance-[name] Scale: Poor Standing balance comment: relies on UE support for balance on RW and external support                             Cognition Arousal/Alertness: Awake/alert Behavior During Therapy: WFL for tasks assessed/performed Overall Cognitive Status: Within Functional Limits for tasks assessed                                        Exercises General Exercises - Lower Extremity Ankle Circles/Pumps: AROM;Both;10 reps;Seated Long Arc Quad: AROM;Both;10 reps;Seated    General Comments General comments (skin integrity, edema, etc.): Pt on HF oxygen therapy and desat to 85% with activity.  HR stable.       Pertinent Vitals/Pain Pain Assessment: No/denies pain    Home Living                      Prior Function            PT Goals (current goals can now be found in the care plan section) Acute Rehab PT Goals Patient Stated Goal: get better Progress towards PT goals: Progressing toward goals    Frequency    Min 3X/week      PT Plan Current plan remains appropriate    Co-evaluation              AM-PAC PT "6 Clicks" Mobility   Outcome Measure  Help needed turning from your back to your side while in a flat bed  without using bedrails?: None Help needed moving from lying on your back to sitting on the side of a flat bed without using bedrails?: A Little Help needed moving to and from a bed to a chair (including a wheelchair)?: A Little Help needed standing up from a chair using your arms (e.g., wheelchair or bedside chair)?: A Little Help needed to walk in hospital room?: A Little Help needed climbing 3-5 steps with a railing? : A Lot 6 Click Score: 18    End of Session Equipment Utilized During Treatment: Oxygen;Gait belt Activity Tolerance: Patient limited by fatigue Patient left: with call bell/phone within reach;in chair;with chair alarm set;with nursing/sitter in room Nurse Communication: Mobility status PT Visit Diagnosis: Unsteadiness on feet (R26.81);Muscle weakness (generalized) (M62.81);Dizziness and giddiness (R42)     Time: 9381-8299 PT Time Calculation (min) (ACUTE ONLY): 26 min  Charges:  $Therapeutic Exercise: 8-22 mins $Therapeutic Activity: 8-22 mins                     Sophiya Morello W,PT Acute Rehabilitation Services Pager:  [phone]  Office:  [place] 09/02/2020, 1:54 PM

## 2020-09-02 NOTE — Progress Notes (Signed)
Pt transferred to bed by primary nurse. Pt was encouraged to stay in chair till dinner but pt refused. No concerns or distress noted. Pt stable. O2 sats stable. No other needs or concerns at this time. Will continue to monitor.

## 2020-09-03 LAB — FIBRINOGEN: Fibrinogen: 479 mg/dL — ABNORMAL HIGH (ref 210–475)

## 2020-09-03 LAB — PROTIME-INR
INR: 5.5 (ref 0.8–1.2)
Prothrombin Time: 48.1 seconds — ABNORMAL HIGH (ref 11.4–15.2)

## 2020-09-03 LAB — CBC
HCT: 37.9 % — ABNORMAL LOW (ref 39.0–52.0)
Hemoglobin: 11.8 g/dL — ABNORMAL LOW (ref 13.0–17.0)
MCH: 25 pg — ABNORMAL LOW (ref 26.0–34.0)
MCHC: 31.1 g/dL (ref 30.0–36.0)
MCV: 80.3 fL (ref 80.0–100.0)
Platelets: 254 10*3/uL (ref 150–400)
RBC: 4.72 MIL/uL (ref 4.22–5.81)
RDW: 15.5 % (ref 11.5–15.5)
WBC: 16.9 10*3/uL — ABNORMAL HIGH (ref 4.0–10.5)
nRBC: 0 % (ref 0.0–0.2)

## 2020-09-03 LAB — BASIC METABOLIC PANEL
Anion gap: 13 (ref 5–15)
BUN: 37 mg/dL — ABNORMAL HIGH (ref 8–23)
CO2: 27 mmol/L (ref 22–32)
Calcium: 8.5 mg/dL — ABNORMAL LOW (ref 8.9–10.3)
Chloride: 100 mmol/L (ref 98–111)
Creatinine, Ser: 1.19 mg/dL (ref 0.61–1.24)
GFR calc Af Amer: >60 mL/min (ref >60)
GFR calc non Af Amer: >60 mL/min (ref >60)
Glucose, Bld: 147 mg/dL — ABNORMAL HIGH (ref 70–99)
Potassium: 3.8 mmol/L (ref 3.5–5.1)
Sodium: 140 mmol/L (ref 135–145)

## 2020-09-03 LAB — HEPATIC FUNCTION PANEL
ALT: 11 U/L (ref 0–44)
AST: 24 U/L (ref 15–41)
Albumin: 2.2 g/dL — ABNORMAL LOW (ref 3.5–5.0)
Alkaline Phosphatase: 57 U/L (ref 38–126)
Bilirubin, Direct: 0.2 mg/dL (ref 0.0–0.2)
Indirect Bilirubin: 0.7 mg/dL (ref 0.3–0.9)
Total Bilirubin: 0.9 mg/dL (ref 0.3–1.2)
Total Protein: 5.9 g/dL — ABNORMAL LOW (ref 6.5–8.1)

## 2020-09-03 LAB — C-REACTIVE PROTEIN: CRP: 5.4 mg/dL — ABNORMAL HIGH (ref <1.0)

## 2020-09-03 LAB — HEPARIN LEVEL (UNFRACTIONATED)
Heparin Unfractionated: 0.92 IU/mL — ABNORMAL HIGH (ref 0.30–0.70)
Heparin Unfractionated: 0.95 IU/mL — ABNORMAL HIGH (ref 0.30–0.70)
Heparin Unfractionated: 0.58 IU/mL (ref 0.30–0.70)

## 2020-09-03 LAB — APTT: aPTT: >200 seconds (ref 24–36)

## 2020-09-03 LAB — DIGOXIN LEVEL: Digoxin Level: 0.9 ng/mL (ref 0.8–2.0)

## 2020-09-03 LAB — D-DIMER, QUANTITATIVE: D-Dimer, Quant: 7.54 ug/mL-FEU — ABNORMAL HIGH (ref 0.00–0.50)

## 2020-09-03 MED ORDER — METHYLPREDNISOLONE SODIUM SUCC 40 MG IJ SOLR
40.0000 mg | Freq: Two times a day (BID) | INTRAMUSCULAR | Status: DC
  Administered 2020-09-03 – 2020-09-07 (×8): 40 mg via INTRAVENOUS
  Filled 2020-09-03 (×9): qty 1

## 2020-09-03 NOTE — Progress Notes (Addendum)
HOSPITAL MEDICINE OVERNIGHT EVENT NOTE    Notified critical lab value of INR of 5.5.  Chart reviewed, patient has been on Coumadin for known history of A. fib/flutter and seems to have developed bilateral DVTs while on Coumadin with a therapeutic INR.  There is a concern by the day team for Coumadin failure and despite INR patient is concurrently on a heparin infusion for treatment of the DVTs.  With rising INR will expand work-up by obtaining PTT, fibrinogen, hepatic function panel and peripheral smear for pathology to review.  Day team to consider hematology consultation.  I have additionally discussed the case with the overnight pharmacist.  It turns out the patient's heparin level is also supratherapeutic and heparin infusion rate will be reduced accordingly.  Patient has not exhibited any bleeding complications per nursing.  Vernelle Emerald  MD [facility]

## 2020-09-03 NOTE — Progress Notes (Addendum)
[facility] PROGRESS NOTE    Progress Note  [name]  HGD:[id number] DOB: 1946-09-23 DOA: 08/25/2020 PCP: [name], MD     Brief Narrative:   [name] is an 74 y.o. male past medical history significant for nonischemic cardiomyopathy with an EF of 25% status post AICD, permanent atrial fibrillation/atrial flutter on Coumadin who is fully vaccinated in March went [place] several days prior to admission started developing shortness of breath at rest with cough nasal congestion loose stools and generalized weakness.  In the ED was found tachypneic hypoxic placed on 2 L of oxygen SARS-CoV-2 PCR was positive tropes 25 D-dimer 1.6 BNP of 425 creatinine at baseline chest x-ray showed bilateral diffuse groundglass opacities asymmetric.  Assessment/Plan:   Acute respiratory failure with hypoxia secondary to Pneumonia due to COVID-19 virus He is still requiring 30 L of oxygen to keep saturations greater than 90% we have come down on his FiO2. His D-dimer and CRP are coming down. He was started on IV heparin for DVT on 09/01/2020 Lower extremity Dopplers was positive for bilateral DVTs, CT angio of the chest showed no large PE but cannot evaluate subsegmental PEs.  He does have significant diffuse pulmonary infiltrates with hilar adenopathy. He has refused Actemra bosutinib, patient relates he is unable to prone due to back pain, continue to use incentive spirometry and flutter valve. We need to get him out of bed into the chair.  His procalcitonin has remained low yield, his leukocytosis is slightly elevated.  But he is on high-dose steroids today is day 9 of steroid.  Acute on chronic systolic heart failure: He continues to be negative about 7 L, we are holding Lasix and losartan. He appears euvolemic on physical exam. Creatinine appears stable, his bicarb is just mildly elevated to 27 we will continue to monitor his strict I's and O's closely.  New  bilateral lower extremity DVT: CT angio of the chest did not showed large PE but was unable to evaluate subsegmental PE due to contrast timing. His INR is trending up despite being off Coumadin for 3 to 4 days, fortunately his D-dimer and his CRP are trending down, since we started IV heparin. His APTT is greater than 200 his is anti-Xa level is mildly elevated his heparin infusion was decreased we are awaiting a repeated anti-Xa levels. I appreciate pharmacy's assistance in this matter. I believe his INR is acting as an acute phase reactant so they will be unreliable. I had a long discussion with him and his wife on 9/14 about the risk and benefits of transitioning to IV heparin as his INR was supratherapeutic falsely elevated.  And they seem to understand the risk and benefit and agreed to proceeding with IV heparin. He will need to go home on DOAC, we had a long conversation about this.  Permanent atrial fibrillation: Discontinue Coumadin and continue IV heparin per pharmacy.  See above for further details. Home on a DOAC once medically stable. Cont heparin for now.  Essential hypertension: Continue metoprolol.  We will hold losartan and Lasix. Blood pressure seems to be well controlled.  Chronic kidney disease stage IIIa: Appears to be at baseline.  Coronary artery disease status post PCI: Continue statins, metoprolol, losartan and aspirin.  Hyperlipidemia: Continue fenofibrate and statins.  Small amount of black stool: Resolved. His blood pressure likely due to hemorrhoids, he had a colonoscopy 08/31/2016 that showed thrombosed external hemorrhoids, no further signs of bleeding his hemoglobin has  remained stable.  Acute confusional state: Continue Haldol IV as needed.  Oral thrush: Started him IV Diflucan for 7 days.   DVT prophylaxis: coumadin Family Communication:wife Status is: Inpatient  Remains inpatient appropriate because:Persistent severe electrolyte  disturbances   Dispo: The patient is from: Home              Anticipated d/c is to: Home              Anticipated d/c date is: > 3 days              Patient currently is not medically stable to d/c. once he is on 4L of oxygen.  He will probably need to go to CIR or skilled nursing facility.  Physical therapy to continue to work with him.  Code Status:     Code Status Orders  (From admission, onward)         Start     Ordered   08/26/20 1045  Full code  Continuous        08/26/20 1046        Code Status History    Date Active Date Inactive Code Status Order ID Comments User Context   04/24/2020 1531 04/24/2020 2124 Full Code [id number]  [name], MD Inpatient   09/30/2017 0116 10/02/2017 1531 Full Code [id number]  [name], MD ED   04/10/2017 1748 04/14/2017 1428 Full Code [id number]  [name], MD Inpatient   01/25/2017 1411 01/25/2017 2220 Full Code [id number]  [name], MD Inpatient   03/02/2016 1628 03/16/2016 2023 Full Code [id number]  [name] Inpatient   02/18/2016 1408 03/02/2016 1613 Full Code [id number]  [name], MD ED   Advance Care Planning Activity        IV Access:    Peripheral IV   Procedures and diagnostic studies:   CT ANGIO CHEST PE W OR WO CONTRAST  Result Date: 09/01/2020 CLINICAL DATA:  COVID positive, positive D-dimer, PE suspected. EXAM: CT ANGIOGRAPHY CHEST WITH CONTRAST TECHNIQUE: Multidetector CT imaging of the chest was performed using the standard protocol during bolus administration of intravenous contrast. Multiplanar CT image reconstructions and MIPs were obtained to evaluate the vascular anatomy. CONTRAST:  150mL OMNIPAQUE IOHEXOL 350 MG/ML SOLN COMPARISON:  Chest x-ray 08/31/2020, CT angio chest 02/22/2016 FINDINGS: Lines: Right chest wall three lead cardiac defibrillator and pacemaker. Cardiovascular: Satisfactory opacification of the pulmonary arteries to the segmental level. No evidence of pulmonary embolism.  Prominent right atrium and left ventricle. No significant pericardial effusion. At least moderate 4 vessel coronary artery calcifications. 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 Mediastinum/Nodes: Palpable of prominent, likely reactive mediastinal lymph nodes with as an example a 1.2 cm right paratracheal lymph node (6:101) and a 1 cm precarinal lymph node on the left (6:109). There is suggestion of a 1 cm right infrahilar lymph node (6:159). As well as suggestion of a prominent left infrahilar lymph node (05/29/2040). No axillary lymph nodes. Thyroid gland, trachea, and esophagus demonstrate no significant findings. Lungs/Pleura: Diffuse, bilateral lower lobe and left upper lobe predominant, peripheral and peribronchovascular patchy and consolidative airspace opacities. No pleural effusion or pneumothorax bilaterally. Upper Abdomen: Partially visualized 4.1 cm fluid density lesion within the left kidney likely represents a renal cyst. Musculoskeletal: No chest wall abnormality No suspicious lytic or blastic osseous lesions. No acute displaced fracture. Multilevel degenerative changes of the spine. Review of the  MIP images confirms the above findings. IMPRESSION: 1. No central or segmental pulmonary embolus. Limited evaluation of the subsegmental level due to timing of contrast. 2. Diffuse pulmonary findings consistent with COVID infection with likely reactive infrahilar and mediastinal lymphadenopathy. 3. 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 4.  Aortic Atherosclerosis (ICD10-I70.0). Electronically Signed   By: Iven Finn M.D.   On: 09/01/2020 21:51   VAS [place] LOWER EXTREMITY VENOUS (DVT)  Result Date: 09/01/2020  Lower Venous DVTStudy Indications: Elevated D dimer.  Risk Factors: + Covid. Performing Technologist: Griffin Basil RCT RDMS  Examination Guidelines: A complete evaluation includes B-mode imaging, spectral Doppler, color Doppler, and power Doppler as needed of all accessible portions of each vessel. Bilateral testing is considered an integral part of a complete examination. Limited examinations for reoccurring indications may be performed as noted. The reflux portion of the exam is performed with the patient in reverse Trendelenburg.  +---------+---------------+---------+-----------+----------+--------------+ RIGHT    CompressibilityPhasicitySpontaneityPropertiesThrombus Aging +---------+---------------+---------+-----------+----------+--------------+ CFV      Full           Yes      Yes                                 +---------+---------------+---------+-----------+----------+--------------+ SFJ      Full                                                        +---------+---------------+---------+-----------+----------+--------------+ FV Prox  Full                                                        +---------+---------------+---------+-----------+----------+--------------+ FV Mid   Full                                                        +---------+---------------+---------+-----------+----------+--------------+ FV DistalFull                                                        +---------+---------------+---------+-----------+----------+--------------+ PFV      Full                                                        +---------+---------------+---------+-----------+----------+--------------+ POP      Full           Yes      Yes                                 +---------+---------------+---------+-----------+----------+--------------+ PTV  Full                                                        +---------+---------------+---------+-----------+----------+--------------+ PERO     None                                         Acute           +---------+---------------+---------+-----------+----------+--------------+   +---------+---------------+---------+-----------+----------+--------------+ LEFT     CompressibilityPhasicitySpontaneityPropertiesThrombus Aging +---------+---------------+---------+-----------+----------+--------------+ CFV      Full           Yes      Yes                                 +---------+---------------+---------+-----------+----------+--------------+ SFJ      Full                                                        +---------+---------------+---------+-----------+----------+--------------+ FV Prox  Full                                                        +---------+---------------+---------+-----------+----------+--------------+ FV Mid   Full                                                        +---------+---------------+---------+-----------+----------+--------------+ FV DistalFull                                                        +---------+---------------+---------+-----------+----------+--------------+ PFV      Full                                                        +---------+---------------+---------+-----------+----------+--------------+ POP      Full           Yes      Yes                                 +---------+---------------+---------+-----------+----------+--------------+ PTV      Full                                                        +---------+---------------+---------+-----------+----------+--------------+  PERO     None                                         Acute          +---------+---------------+---------+-----------+----------+--------------+     Summary: RIGHT: - Findings consistent with acute deep vein thrombosis involving the right peroneal veins. - No cystic structure found in the popliteal fossa.  LEFT: - Findings consistent with acute deep vein thrombosis involving the left peroneal veins. - No cystic  structure found in the popliteal fossa.  *See table(s) above for measurements and observations. Electronically signed by Curt Jews MD on 09/01/2020 at 5:00:44 PM.    Final      Medical Consultants:    None.  Anti-Infectives:   none  Subjective:    DONTAVION NOXON relates his breathing is unchanged compared to yesterday.  Objective:    Vitals:   09/02/20 2026 09/03/20 0254 09/03/20 0506 09/03/20 0809  BP:   (!) 106/49 (!) 109/57  Pulse:   72 69  Resp:   (!) 21 20  Temp:   (!) 97.5 F (36.4 C) 97.6 F (36.4 C)  TempSrc:   Oral Oral  SpO2: (!) 88% 96% 91% 90%  Weight:   85.3 kg   Height:       SpO2: 90 % O2 Flow Rate (L/min): 30 L/min FiO2 (%): 90 %   Intake/Output Summary (Last 24 hours) at 09/03/2020 0949 Last data filed at 09/03/2020 0551 Gross per 24 hour  Intake 480.17 ml  Output 1000 ml  Net -519.83 ml   Filed Weights   08/31/20 0548 09/02/20 0508 09/03/20 0506  Weight: 85.6 kg 87.5 kg 85.3 kg    Exam: General exam: In no acute distress. Respiratory system: Diffuse crackles bilaterally. Cardiovascular system: S1 & S2 heard, RRR. No JVD. Gastrointestinal system: Abdomen is nondistended, soft and nontender.  Extremities: No pedal edema. Skin: No rashes, lesions or ulcers Psychiatry: Judgement and insight appear normal. Mood & affect appropriate.  Data Reviewed:    Labs: Basic Metabolic Panel: Recent Labs  Lab 08/28/20 0500 08/28/20 0500 08/29/20 0742 08/29/20 0742 08/30/20 0708 08/30/20 0708 08/31/20 1131 08/31/20 1131 09/02/20 0857 09/03/20 0249  NA 141   < > 141  --  141  --  142  --  138 140  K 3.7   < > 3.5   < > 3.5   < > 3.5   < > 3.8 3.8  CL 99   < > 99  --  100  --  99  --  98 100  CO2 27   < > 27  --  29  --  30  --  27 27  GLUCOSE 151*   < > 131*  --  139*  --  121*  --  87 147*  BUN 32*   < > 38*  --  49*  --  46*  --  41* 37*  CREATININE 1.27*   < > 1.43*  --  1.53*  --  1.42*  --  1.17 1.19  CALCIUM 9.4   < > 9.1  --   8.7*  --  8.5*  --  8.4* 8.5*  MG 2.0  --  1.9  --  2.0  --  2.2  --   --   --   PHOS 4.1  --  3.8  --  4.1  --  2.9  --   --   --    < > = values in this interval not displayed.   GFR Estimated Creatinine Clearance: 57.9 mL/min (by C-G formula based on SCr of 1.19 mg/dL). Liver Function Tests: Recent Labs  Lab 08/28/20 0500 08/29/20 0742 08/30/20 0708 08/31/20 1131 09/03/20 0800  AST 28 25 20 24 24   ALT 21 18 16 14 11   ALKPHOS 48 52 47 55 57  BILITOT 0.6 0.8 0.9 0.7 0.9  PROT 6.7 6.3* 6.0* 6.0* 5.9*  ALBUMIN 2.9* 2.5* 2.3* 2.3* 2.2*   No results for input(s): LIPASE, AMYLASE in the last 168 hours. No results for input(s): AMMONIA in the last 168 hours. Coagulation profile Recent Labs  Lab 08/30/20 0708 08/31/20 1131 09/01/20 0756 09/02/20 0030 09/03/20 0249  INR 4.2* 4.0* 4.0* 4.8* 5.5*   COVID-19 Labs  Recent Labs    08/31/20 1131 09/02/20 0857 09/02/20 1025 09/03/20 0249  DDIMER 18.38*  --  7.95* 7.54*  FERRITIN 1,396*  --   --   --   CRP 7.5* 7.2*  --  5.4*    Lab Results  Component Value Date   SARSCOV2NAA POSITIVE (A) 08/26/2020   [place] NEGATIVE 04/21/2020    CBC: Recent Labs  Lab 08/28/20 0500 08/28/20 0500 08/29/20 1530 08/30/20 0708 08/31/20 1131 09/02/20 0030 09/03/20 0249  WBC 16.1*   < > 17.2* 14.1* 17.3* 15.3* 16.9*  NEUTROABS 14.6*  --  15.5* 12.5* 15.7*  --   --   HGB 12.2*   < > 11.3* 11.0* 12.6* 11.2* 11.8*  HCT 38.4*   < > 36.1* 34.6* 39.4 36.5* 37.9*  MCV 81.9   < > 80.8 79.5* 80.7 82.4 80.3  PLT 402*   < > 376 329 339 268 254   < > = values in this interval not displayed.   Cardiac Enzymes: No results for input(s): CKTOTAL, CKMB, CKMBINDEX, TROPONINI in the last 168 hours. BNP (last 3 results) No results for input(s): PROBNP in the last 8760 hours. CBG: Recent Labs  Lab 08/27/20 1646 09/02/20 1112  GLUCAP 172* 99   D-Dimer: Recent Labs    09/02/20 1025 09/03/20 0249  DDIMER 7.95* 7.54*   Hgb A1c: No  results for input(s): HGBA1C in the last 72 hours. Lipid Profile: No results for input(s): CHOL, HDL, LDLCALC, TRIG, CHOLHDL, LDLDIRECT in the last 72 hours. Thyroid function studies: No results for input(s): TSH, T4TOTAL, T3FREE, THYROIDAB in the last 72 hours.  Invalid input(s): FREET3 Anemia work up: Recent Labs    08/31/20 1131  FERRITIN 1,396*   Sepsis Labs: Recent Labs  Lab 08/30/20 0708 08/31/20 1131 09/01/20 0756 09/02/20 0030 09/03/20 0249  PROCALCITON  --  <0.10 <0.10 <0.10  --   WBC 14.1* 17.3*  --  15.3* 16.9*   Microbiology Recent Results (from the past 240 hour(s))  SARS Coronavirus 2 by RT PCR (hospital order, performed in [facility] hospital lab) Nasopharyngeal Nasopharyngeal Swab     Status: Abnormal   Collection Time: 08/26/20  6:55 AM   Specimen: Nasopharyngeal Swab  Result Value Ref Range Status   SARS Coronavirus 2 POSITIVE (A) NEGATIVE Final    Comment: emailed L. Berdik RN 10:00 08/26/20 ([email or web address]) (NOTE) SARS-CoV-2 target nucleic acids are DETECTED  SARS-CoV-2 RNA is generally detectable in upper respiratory specimens  during the acute phase of infection.  Positive results are indicative  of the presence of the identified virus, but do not rule out bacterial  infection or co-infection with other pathogens not detected by the test.  Clinical correlation with patient history and  other diagnostic information is necessary to determine patient infection status.  The expected result is negative.  Fact Sheet for Patients:   StrictlyIdeas.no   Fact Sheet for Healthcare Providers:   BankingDealers.co.za    This test is not yet approved or cleared by the [place] FDA and  has been authorized for detection and/or diagnosis of SARS-CoV-2 by FDA under an Emergency Use Authorization (EUA).  This EUA will remain in effect (meaning this test can be used) for the duration of  the  COVID-19 declaration under  Section 564(b)(1) of the Act, 21 U.S.C. section 360-bbb-3(b)(1), unless the authorization is terminated or revoked sooner.  Performed at [facility], [place] [address] [place], [place] [address]   Blood Culture (routine x 2)     Status: None   Collection Time: 08/26/20 11:36 AM   Specimen: BLOOD  Result Value Ref Range Status   Specimen Description BLOOD RIGHT ANTECUBITAL  Final   Special Requests   Final    BOTTLES DRAWN AEROBIC AND ANAEROBIC Blood Culture adequate volume   Culture   Final    NO GROWTH 7 DAYS Performed at [facility], [place] [address], [place], [place] [address]    Report Status 09/02/2020 FINAL  Final  Blood Culture (routine x 2)     Status: None   Collection Time: 08/27/20  4:56 AM   Specimen: BLOOD LEFT FOREARM  Result Value Ref Range Status   Specimen Description BLOOD LEFT FOREARM  Final   Special Requests   Final    BOTTLES DRAWN AEROBIC AND ANAEROBIC Blood Culture results may not be optimal due to an inadequate volume of blood received in culture bottles   Culture   Final    NO GROWTH 5 DAYS Performed at [facility], [place] [address] [place],  [address]    Report Status 09/01/2020 FINAL  Final     Medications:   . albuterol  2 puff Inhalation TID  . vitamin C  500 mg Oral Daily  . digoxin  0.125 mg Oral Daily  . fenofibrate  160 mg Oral Daily  . ferrous sulfate  325 mg Oral BID  . folic acid  1 mg Oral QPM  . methylPREDNISolone (SOLU-MEDROL) injection  80 mg Intravenous Q12H  . metoprolol succinate  25 mg Oral Daily  . pantoprazole  40 mg Oral BID  . pravastatin  40 mg Oral Daily  . sertraline  150 mg Oral Q1400  . zinc sulfate  220 mg Oral Daily   Continuous Infusions: . fluconazole (DIFLUCAN) IV    . heparin 1,100 Units/hr (09/03/20 0501)      LOS: 8 days   Charlynne Cousins  [facility]  09/03/2020, 9:49 AM

## 2020-09-03 NOTE — Progress Notes (Signed)
Tiffany with [facility] has accepted the patient. Start of care date is 09/07/20 - Monday. Garrett  Supervisor [phone]

## 2020-09-03 NOTE — Progress Notes (Addendum)
[place] for Heparin Indication: DVT and r/o PE  Allergies  Allergen Reactions  . Keflex [Cephalexin] Shortness Of Breath and Other (See Comments)    dizziness    Patient Measurements: Height: 5\' 10"  (177.8 cm) Weight: 85.3 kg (188 lb 0.8 oz) IBW/kg (Calculated) : 73 Heparin Dosing Weight: 85 kg  Vital Signs: Temp: 97.6 F (36.4 C) (09/16 1227) Temp Source: Oral (09/16 1227) BP: 131/53 (09/16 1227) Pulse Rate: 69 (09/16 1505)  Labs: Recent Labs    09/01/20 0756 09/02/20 0030 09/02/20 0857 09/02/20 1025 09/03/20 0249 09/03/20 0800 09/03/20 1123 09/03/20 1850  HGB  --  11.2*  --   --  11.8*  --   --   --   HCT  --  36.5*  --   --  37.9*  --   --   --   PLT  --  268  --   --  254  --   --   --   APTT  --   --   --   --   --  >200*  --   --   LABPROT 38.1* 43.6*  --   --  48.1*  --   --   --   INR 4.0* 4.8*  --   --  5.5*  --   --   --   HEPARINUNFRC  --   --   --    < > 0.92*  --  0.95* 0.58  CREATININE  --   --  1.17  --  1.19  --   --   --    < > = values in this interval not displayed.    Estimated Creatinine Clearance: 57.9 mL/min (by C-G formula based on SCr of 1.19 mg/dL).  Assessment: 74 yr old male presented with SOB, cough, found to be covid+ (pt is fully vaccinated).  PMH NICM EF 25 S/P AICD, permanent afib, on warfarin  9/14 with increasing oxygen requirements, up to 40L HFNC; inflammatory markers up, including CRP 7.5 (now down to 5.4), ferritin 1396. On admission, D-dimer was 10.67 (9/8), then climbed to > 20 for 2 days, now down to 7.54. LE dopplers were done on 9/14 with BL acute DVT; CTA:  no central or segmental pulmonary embolus, unable to evaluate subsegmental level due to timing of contrast.  Patient was on warfarin 3 mg daily PTA. INR day prior to admit as outpatient was 2.5. Admission INR was 2.5 >> 3.6 on hospital day 2, then was above 4 ever since and up to 5.5 today (last warfarin dose was 9/8)  CBC  has been stable since admission; no bleeding issues.  Pharmacy discussed with Dr Aileen Fass the fact patient has now had acute DVT while with a supratherapeutic INR. He was concerned this was acute phase reactant associated with COVID (since other  inflammatory markers going up). No change in LFTs. Could also just be warfarin failure in setting of COVID associated thrombosis.  Heparin level ~6 hrs after heparin infusion was decreased to 900 units/hr is 0.58 units/ml, which is within the goal range for this pt. CBC stable. Per RN, no issues with IV or bleeding observed.  Goal of Therapy:  Heparin level 0.3-0.7 units/ml Monitor platelets by anticoagulation protocol: Yes   Plan:  Continue heparin infusion at 900 units/hr Check confirmatory heparin level in 6 hrs Monitor daily heparin level, CBC, INR Monitor for signs/symptoms of bleeding F/U transition to [place] when able  Diane  Nikki Dom, PharmD, BCPS, [facility] Clinical Pharmacist 09/03/2020 7:56 PM

## 2020-09-03 NOTE — Progress Notes (Signed)
Lab notified this RN regarding inr 5.5. Hospitalist notified. Assessed pt for signs of bleeding. Large hemorrhoids noted to be protruding from anus. Minimal bleeding observed and documented. Patient denied pain/discomfort. Reports hemorrhoids are painful while up in chair not in bed. Also discussed with patient his inr level and need to decrease Heparin gtt rate for the second time this shift, along with further lab testing.  Patient verbalized understanding. Remains on heated high flow o2. Call light in reach. Will con't to monitor.

## 2020-09-03 NOTE — Progress Notes (Signed)
[place] for Heparin Indication: DVT and r/o PE  Allergies  Allergen Reactions  . Keflex [Cephalexin] Shortness Of Breath and Other (See Comments)    dizziness    Patient Measurements: Height: 5\' 10"  (177.8 cm) Weight: 87.5 kg (192 lb 14.4 oz) IBW/kg (Calculated) : 73 Heparin Dosing Weight: 85 kg  Vital Signs: Temp: 97.9 F (36.6 C) (09/15 2001) Temp Source: Oral (09/15 2001) BP: 116/56 (09/15 2001) Pulse Rate: 70 (09/15 2001)  Labs: Recent Labs    08/31/20 1131 08/31/20 1131 09/01/20 0756 09/02/20 0030 09/02/20 0857 09/02/20 1025 09/02/20 1816 09/03/20 0249  HGB 12.6*   < >  --  11.2*  --   --   --  11.8*  HCT 39.4  --   --  36.5*  --   --   --  37.9*  PLT 339  --   --  268  --   --   --  254  LABPROT 38.0*  --  38.1* 43.6*  --   --   --   --   INR 4.0*  --  4.0* 4.8*  --   --   --   --   HEPARINUNFRC  --   --   --   --   --  0.70 0.96* 0.92*  CREATININE 1.42*  --   --   --  1.17  --   --  1.19   < > = values in this interval not displayed.    Estimated Creatinine Clearance: 57.9 mL/min (by C-G formula based on SCr of 1.19 mg/dL).  Assessment: 74 yr old male presented with SOB, cough, found to be covid+ (pt is fully vaccinated).  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 On admission, D-dimer was 10.67 (9/8), then climbed to > 20 for 2 days, now down to 7.95. LE dopplers were done on 9/14 with BL acute DVT; CTA:  no central or segmental pulmonary embolus, unable to   Patient is on warfarin 3 mg daily PTA. INR day prior to admit as outpatient was 2.5. Admission INR was 2.5 >> 3.6 on hospital day 2, then was above 4 ever since (last warfarin dose was 9/8) CBC has been stable since admission; no bleeding issues.  Pharmacy discussed with Dr Aileen Fass the fact patient has now had acute DVT  while with a supratherapeutic INR. He was concerned this was acute phase reactant associated with COVID (since all other markers going up). No change in LFTs. Could also just be warfarin failure in setting of COVID associated thrombosis.  Heparin level remains supratherapeutic (0.92) on gtt at 1250 units/hr. No bleeding noted.  Goal of Therapy:  Heparin level 0.3-0.7 units/ml Monitor platelets by anticoagulation protocol: Yes   Plan:  Reduce heparin infusion to 1100 units/hr Check 6-hr heparin level  Sherlon Handing, PharmD, BCPS Please see amion for complete clinical pharmacist phone list 09/03/2020 4:05 AM

## 2020-09-03 NOTE — Progress Notes (Signed)
CRITICAL VALUE ALERT  Critical Value:  PTT- >200 sec take @ 0800 AM Date & Time Notied: 09/03/20 @ 7125 AM Provider Notified: Dr. Venetia Constable Orders Received/Actions taken:  Awaiting orders

## 2020-09-03 NOTE — Progress Notes (Signed)
[place] for Heparin Indication: DVT and r/o PE  Allergies  Allergen Reactions  . Keflex [Cephalexin] Shortness Of Breath and Other (See Comments)    dizziness    Patient Measurements: Height: 5\' 10"  (177.8 cm) Weight: 85.3 kg (188 lb 0.8 oz) IBW/kg (Calculated) : 73 Heparin Dosing Weight: 85 kg  Vital Signs: Temp: 97.6 F (36.4 C) (09/16 0809) Temp Source: Oral (09/16 0809) BP: 109/57 (09/16 0809) Pulse Rate: 69 (09/16 1029)  Labs: Recent Labs    09/01/20 0756 09/02/20 0030 09/02/20 0857 09/02/20 1025 09/02/20 1816 09/03/20 0249 09/03/20 0800 09/03/20 1123  HGB  --  11.2*  --   --   --  11.8*  --   --   HCT  --  36.5*  --   --   --  37.9*  --   --   PLT  --  268  --   --   --  254  --   --   APTT  --   --   --   --   --   --  >200*  --   LABPROT 38.1* 43.6*  --   --   --  48.1*  --   --   INR 4.0* 4.8*  --   --   --  5.5*  --   --   HEPARINUNFRC  --   --   --    < > 0.96* 0.92*  --  0.95*  CREATININE  --   --  1.17  --   --  1.19  --   --    < > = values in this interval not displayed.    Estimated Creatinine Clearance: 57.9 mL/min (by C-G formula based on SCr of 1.19 mg/dL).  Assessment: 74 yr old male presented with SOB, cough, found to be covid+ (pt is fully vaccinated).  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 On admission, D-dimer was 10.67 (9/8), then climbed to > 20 for 2 days, now down to 7.95. LE dopplers were done on 9/14 with BL acute DVT; CTA:  no central or segmental pulmonary embolus, unable to   Patient is on warfarin 3 mg daily PTA. INR day prior to admit as outpatient was 2.5. Admission INR was 2.5 >> 3.6 on hospital day 2, then was above 4 ever since (last warfarin dose was 9/8) CBC has been stable since admission; no bleeding issues.  Pharmacy discussed with Dr Aileen Fass the fact patient has now had acute DVT while with a supratherapeutic INR. He was concerned this was acute phase reactant associated with COVID (since all other markers going up). No change in LFTs. Could also just be warfarin failure in setting of COVID associated thrombosis.  Heparin level remains supratherapeutic s/p rate decrease to 1100 units/hr, rate confirmed and RN confirmed lab was drawn from opposite arm of heparin infusion. No bleeding observed at this time.   Goal of Therapy:  Heparin level 0.3-0.7 units/ml Monitor platelets by anticoagulation protocol: Yes   Plan:  Decrease heparin gtt to 900 units/hr F/u 6 hour heparin level  Bertis Ruddy, PharmD Clinical Pharmacist Please check AMION for all [place] numbers 09/03/2020 12:13 PM

## 2020-09-03 NOTE — Progress Notes (Addendum)
@   1227- Sat pt on the edge of bed for a few mins and encourage him to do deep breathing exercises then eventually transferred him to the chair pt became slightly tachypneic but quickly recover. Instructed pt. To stay on the chair for few hrs as tolerated.  @1505 - Transferred back to bed, pt tolerated well w/o any dyspnea.

## 2020-09-03 NOTE — Plan of Care (Signed)
  Problem: Education: Goal: Knowledge of General Education information will improve Description: Including pain rating scale, medication(s)/side effects and non-pharmacologic comfort measures Outcome: Progressing   Problem: Health Behavior/Discharge Planning: Goal: Ability to manage health-related needs will improve Outcome: Progressing   Problem: Clinical Measurements: Goal: Respiratory complications will improve Outcome: Progressing   

## 2020-09-04 DIAGNOSIS — N1831 Chronic kidney disease, stage 3a: Secondary | ICD-10-CM

## 2020-09-04 DIAGNOSIS — I4821 Permanent atrial fibrillation: Secondary | ICD-10-CM

## 2020-09-04 LAB — CBC
HCT: 42 % (ref 39.0–52.0)
Hemoglobin: 12.9 g/dL — ABNORMAL LOW (ref 13.0–17.0)
MCH: 25 pg — ABNORMAL LOW (ref 26.0–34.0)
MCHC: 30.7 g/dL (ref 30.0–36.0)
MCV: 81.4 fL (ref 80.0–100.0)
Platelets: 280 10*3/uL (ref 150–400)
RBC: 5.16 MIL/uL (ref 4.22–5.81)
RDW: 15.6 % — ABNORMAL HIGH (ref 11.5–15.5)
WBC: 21.6 10*3/uL — ABNORMAL HIGH (ref 4.0–10.5)
nRBC: 0 % (ref 0.0–0.2)

## 2020-09-04 LAB — BASIC METABOLIC PANEL
Anion gap: 12 (ref 5–15)
BUN: 36 mg/dL — ABNORMAL HIGH (ref 8–23)
CO2: 26 mmol/L (ref 22–32)
Calcium: 8.8 mg/dL — ABNORMAL LOW (ref 8.9–10.3)
Chloride: 99 mmol/L (ref 98–111)
Creatinine, Ser: 1.42 mg/dL — ABNORMAL HIGH (ref 0.61–1.24)
GFR calc Af Amer: 57 mL/min — ABNORMAL LOW (ref >60)
GFR calc non Af Amer: 49 mL/min — ABNORMAL LOW (ref >60)
Glucose, Bld: 137 mg/dL — ABNORMAL HIGH (ref 70–99)
Potassium: 3.7 mmol/L (ref 3.5–5.1)
Sodium: 137 mmol/L (ref 135–145)

## 2020-09-04 LAB — C-REACTIVE PROTEIN: CRP: 3.3 mg/dL — ABNORMAL HIGH (ref <1.0)

## 2020-09-04 LAB — D-DIMER, QUANTITATIVE: D-Dimer, Quant: 5.86 ug/mL-FEU — ABNORMAL HIGH (ref 0.00–0.50)

## 2020-09-04 LAB — HEPARIN LEVEL (UNFRACTIONATED): Heparin Unfractionated: 0.58 IU/mL (ref 0.30–0.70)

## 2020-09-04 LAB — PATHOLOGIST SMEAR REVIEW

## 2020-09-04 LAB — PROTIME-INR
INR: 5.8 (ref 0.8–1.2)
Prothrombin Time: 50.3 seconds — ABNORMAL HIGH (ref 11.4–15.2)

## 2020-09-04 MED ORDER — SODIUM CHLORIDE 0.9 % IV SOLN
INTRAVENOUS | Status: DC | PRN
  Administered 2020-09-11 – 2020-09-13 (×4): 250 mL via INTRAVENOUS

## 2020-09-04 NOTE — Progress Notes (Signed)
Heated HFNC- 25L, FiO2 80%. Sats WNL.

## 2020-09-04 NOTE — Progress Notes (Signed)
PROGRESS NOTE  MERWYN [name] LPF:[id number] DOB: 1946/10/18 DOA: 08/25/2020 PCP: [name], MD   LOS: 9 days   Brief Narrative / Interim history: [name] is an 74 y.o. male past medical history significant for nonischemic cardiomyopathy with an EF of 25% status post AICD, permanent atrial fibrillation/atrial flutter on Coumadin who is fully vaccinated in March [place] several days prior to admission started developing shortness of breath at rest with cough nasal congestion loose stools and generalized weakness.  In the ED was found tachypneic hypoxic placed on 2 L of oxygen SARS-CoV-2 PCR was positive tropes 25 D-dimer 1.6 BNP of 425 creatinine at baseline chest x-ray showed bilateral diffuse groundglass opacities asymmetric.  Subjective / 24h Interval events: Overall he appreciates that he is improving.  No chest pain.  No abdominal pain, nausea or vomiting.  Still quite short of breath with minimal activity  Assessment & Plan:  Principal Problem Acute Hypoxic Respiratory Failure due to Covid-19 Viral Illness -Remains on heated high flow, 25 L oxygen 70% FiO2 today FiO2 (%):  [70 %-90 %] 70 %  -Remains high risk for decompensation -D-dimer, CRP -Patient has refused Actemra and baricitinib but he completed remdesivir -Continue steroids  COVID-19 Labs  Recent Labs    09/02/20 0857 09/02/20 1025 09/03/20 0249 09/04/20 1028  DDIMER  --  7.95* 7.54* 5.86*  CRP 7.2*  --  5.4* 3.3*    Lab Results  Component Value Date   SARSCOV2NAA POSITIVE (A) 08/26/2020   Pump Back NEGATIVE 04/21/2020    Active Problems Acute bilateral lower extremity DVT -this was despite being on Coumadin.  Currently on heparin infusion, no bleeding, continue.  When closer to discharge will need to transition to [place].  His INR is still elevated in the 5 range, I wonder whether this is acting like an acute phase reactant  Acute on chronic systolic CHF-he is net  negative about 7 L.  His losartan and Lasix are on hold, continue to hold, creatinine 1.4 this morning slightly up compared to yesterday but still within his baseline clinically appears euvolemic.  Permanent A. fib-on heparin, metoprolol  Chronic kidney disease stage IIIa-fluctuates daily but overall at baseline  CAD status post PCI-continue statin, metoprolol, losartan, aspirin  Hyperlipidemia-continue statins  Small amount of black stool-resolved, he does have thrombosed external hemorrhoids on colonoscopy in 2017.  No further bleeding  In-hospital delirium-resolved  Oral thrush - continue IV Diflucan for 7 days  Scheduled Meds: . albuterol  2 puff Inhalation TID  . vitamin C  500 mg Oral Daily  . digoxin  0.125 mg Oral Daily  . fenofibrate  160 mg Oral Daily  . ferrous sulfate  325 mg Oral BID  . folic acid  1 mg Oral QPM  . methylPREDNISolone (SOLU-MEDROL) injection  40 mg Intravenous Q12H  . metoprolol succinate  25 mg Oral Daily  . pantoprazole  40 mg Oral BID  . pravastatin  40 mg Oral Daily  . sertraline  150 mg Oral Q1400  . zinc sulfate  220 mg Oral Daily   Continuous Infusions: . fluconazole (DIFLUCAN) IV 100 mg (09/04/20 0953)  . heparin 900 Units/hr (09/03/20 1248)   PRN Meds:.acetaminophen, antiseptic oral rinse, chlorpheniramine-HYDROcodone, haloperidol lactate, nitroGLYCERIN, ondansetron **OR** ondansetron (ZOFRAN) IV, sodium chloride, traMADol-acetaminophen, zolpidem  DVT prophylaxis: heparin  Code Status: Full code Family Communication: called wife Santiago Glad, no answer, left message  Status is: Inpatient  Remains inpatient appropriate because:Inpatient level of care appropriate  due to severity of illness   Dispo: The patient is from: Home              Anticipated d/c is to: Home              Anticipated d/c date is: > 3 days              Patient currently is not medically stable to d/c.  Consultants:  None   Procedures:  None   Microbiology: None    Antibacterials: None    Objective: Vitals:   09/04/20 0545 09/04/20 0757 09/04/20 0948 09/04/20 1245  BP: 135/63  132/64 96/85  Pulse: 70 70 74 71  Resp: 20 20  19   Temp: (!) 97.3 F (36.3 C)   98 F (36.7 C)  TempSrc: Oral   Oral  SpO2: 92% 94%  95%  Weight: 83.3 kg     Height:        Intake/Output Summary (Last 24 hours) at 09/04/2020 1347 Last data filed at 09/04/2020 0428 Gross per 24 hour  Intake 747.45 ml  Output 900 ml  Net -152.55 ml   Filed Weights   09/02/20 0508 09/03/20 0506 09/04/20 0545  Weight: 87.5 kg 85.3 kg 83.3 kg    Examination: Constitutional: NAD Eyes: no scleral icterus ENMT: Mucous membranes are moist.  Neck: normal, supple Respiratory: Diffuse bibasilar rhonchi, no wheezing, no crackles Cardiovascular: Regular rate and rhythm, no murmurs / rubs / gallops.  Trace lower extremity edema Abdomen: non distended, no tenderness. Bowel sounds positive.  Musculoskeletal: no clubbing / cyanosis.  Skin: no rashes Neurologic: No focal deficits  Data Reviewed: I have independently reviewed following labs and imaging studies   CBC: Recent Labs  Lab 08/29/20 1530 08/29/20 1530 08/30/20 0708 08/31/20 1131 09/02/20 0030 09/03/20 0249 09/04/20 1028  WBC 17.2*   < > 14.1* 17.3* 15.3* 16.9* 21.6*  NEUTROABS 15.5*  --  12.5* 15.7*  --   --   --   HGB 11.3*   < > 11.0* 12.6* 11.2* 11.8* 12.9*  HCT 36.1*   < > 34.6* 39.4 36.5* 37.9* 42.0  MCV 80.8   < > 79.5* 80.7 82.4 80.3 81.4  PLT 376   < > 329 339 268 254 280   < > = values in this interval not displayed.   Basic Metabolic Panel: Recent Labs  Lab 08/29/20 0742 08/29/20 0742 08/30/20 0708 08/31/20 1131 09/02/20 0857 09/03/20 0249 09/04/20 1028  NA 141   < > 141 142 138 140 137  K 3.5   < > 3.5 3.5 3.8 3.8 3.7  CL 99   < > 100 99 98 100 99  CO2 27   < > 29 30 27 27 26   GLUCOSE 131*   < > 139* 121* 87 147* 137*  BUN 38*   < > 49* 46* 41* 37* 36*  CREATININE 1.43*   < > 1.53* 1.42*  1.17 1.19 1.42*  CALCIUM 9.1   < > 8.7* 8.5* 8.4* 8.5* 8.8*  MG 1.9  --  2.0 2.2  --   --   --   PHOS 3.8  --  4.1 2.9  --   --   --    < > = values in this interval not displayed.   GFR: Estimated Creatinine Clearance: 48.6 mL/min (A) (by C-G formula based on SCr of 1.42 mg/dL (H)). Liver Function Tests: Recent Labs  Lab 08/29/20 0742 08/30/20 0708 08/31/20 1131 09/03/20 0800  AST 25 20 24 24   ALT 18 16 14 11   ALKPHOS 52 47 55 57  BILITOT 0.8 0.9 0.7 0.9  PROT 6.3* 6.0* 6.0* 5.9*  ALBUMIN 2.5* 2.3* 2.3* 2.2*   No results for input(s): LIPASE, AMYLASE in the last 168 hours. No results for input(s): AMMONIA in the last 168 hours. Coagulation Profile: Recent Labs  Lab 08/31/20 1131 09/01/20 0756 09/02/20 0030 09/03/20 0249 09/04/20 1028  INR 4.0* 4.0* 4.8* 5.5* 5.8*   Cardiac Enzymes: No results for input(s): CKTOTAL, CKMB, CKMBINDEX, TROPONINI in the last 168 hours. BNP (last 3 results) No results for input(s): PROBNP in the last 8760 hours. HbA1C: No results for input(s): HGBA1C in the last 72 hours. CBG: Recent Labs  Lab 09/02/20 1112  GLUCAP 99   Lipid Profile: No results for input(s): CHOL, HDL, LDLCALC, TRIG, CHOLHDL, LDLDIRECT in the last 72 hours. Thyroid Function Tests: No results for input(s): TSH, T4TOTAL, FREET4, T3FREE, THYROIDAB in the last 72 hours. Anemia Panel: No results for input(s): VITAMINB12, FOLATE, FERRITIN, TIBC, IRON, RETICCTPCT in the last 72 hours. Urine analysis:    Component Value Date/Time   COLORURINE AMBER (A) 09/29/2017 2310   APPEARANCEUR HAZY (A) 09/29/2017 2310   LABSPEC 1.013 09/29/2017 2310   PHURINE 5.0 09/29/2017 2310   GLUCOSEU NEGATIVE 09/29/2017 2310   HGBUR NEGATIVE 09/29/2017 [place] 09/29/2017 2310   KETONESUR NEGATIVE 09/29/2017 2310   PROTEINUR NEGATIVE 09/29/2017 2310   NITRITE NEGATIVE 09/29/2017 2310   LEUKOCYTESUR NEGATIVE 09/29/2017 2310   Sepsis Labs: Invalid input(s):  PROCALCITONIN, LACTICIDVEN  Recent Results (from the past 240 hour(s))  SARS Coronavirus 2 by RT PCR (hospital order, performed in [facility] hospital lab) Nasopharyngeal Nasopharyngeal Swab     Status: Abnormal   Collection Time: 08/26/20  6:55 AM   Specimen: Nasopharyngeal Swab  Result Value Ref Range Status   SARS Coronavirus 2 POSITIVE (A) NEGATIVE Final    Comment: emailed L. Berdik RN 10:00 08/26/20 ([email or web address]) (NOTE) SARS-CoV-2 target nucleic acids are DETECTED  SARS-CoV-2 RNA is generally detectable in upper respiratory specimens  during the acute phase of infection.  Positive results are indicative  of the presence of the identified virus, but do not rule out bacterial infection or co-infection with other pathogens not detected by the test.  Clinical correlation with patient history and  other diagnostic information is necessary to determine patient infection status.  The expected result is negative.  Fact Sheet for Patients:   StrictlyIdeas.no   Fact Sheet for Healthcare Providers:   BankingDealers.co.za    This test is not yet approved or cleared by the [place] FDA and  has been authorized for detection and/or diagnosis of SARS-CoV-2 by FDA under an Emergency Use Authorization (EUA).  This EUA will remain in effect (meaning this test can be used) for the duration of  the  COVID-19 declaration under Section 564(b)(1) of the Act, 21 U.S.C. section 360-bbb-3(b)(1), unless the authorization is terminated or revoked sooner.  Performed at [facility], [place] [address] [place], [place] [address]   Blood Culture (routine x 2)     Status: None   Collection Time: 08/26/20 11:36 AM   Specimen: BLOOD  Result Value Ref Range Status   Specimen Description BLOOD RIGHT ANTECUBITAL  Final   Special Requests   Final    BOTTLES DRAWN AEROBIC AND ANAEROBIC Blood Culture adequate volume   Culture   Final    NO GROWTH 7  DAYS Performed  at [facility], [place] [address], [place], [place] [address]    Report Status 09/02/2020 FINAL  Final  Blood Culture (routine x 2)     Status: None   Collection Time: 08/27/20  4:56 AM   Specimen: BLOOD LEFT FOREARM  Result Value Ref Range Status   Specimen Description BLOOD LEFT FOREARM  Final   Special Requests   Final    BOTTLES DRAWN AEROBIC AND ANAEROBIC Blood Culture results may not be optimal due to an inadequate volume of blood received in culture bottles   Culture   Final    NO GROWTH 5 DAYS Performed at [facility], [place] [address] [place], [place] [address]    Report Status 09/01/2020 FINAL  Final      Radiology Studies: No results found.  Marzetta Board, MD, PhD [facility]  Between 7 am - 7 pm I am available, please contact me via Amion or Securechat  Between 7 pm - 7 am I am not available, please contact night coverage MD/APP via Amion

## 2020-09-04 NOTE — Progress Notes (Signed)
Patient complained about his oxygen being too loud. Patient is on heated HFNC @ 30L O2 on 90% FiO2.  RN spoke with RT and decided to turn down O2 to 25L and see if sats hold up and then we can further titrate down. O2 sats have been in the 90s with no reports of patient desating. Will continue to monitor.

## 2020-09-04 NOTE — Progress Notes (Signed)
[name] for Heparin Indication: DVT and r/o PE  Allergies  Allergen Reactions  . Keflex [Cephalexin] Shortness Of Breath and Other (See Comments)    dizziness    Patient Measurements: Height: 5\' 10"  (177.8 cm) Weight: 83.3 kg (183 lb 10.3 oz) IBW/kg (Calculated) : 73 Heparin Dosing Weight: 85 kg  Vital Signs: Temp: 97.3 F (36.3 C) (09/17 0545) Temp Source: Oral (09/17 0545) BP: 132/64 (09/17 0948) Pulse Rate: 74 (09/17 0948)  Labs: Recent Labs     0000 09/02/20 0030 09/02/20 0857 09/02/20 1025 09/03/20 0249 09/03/20 0249 09/03/20 0800 09/03/20 1123 09/03/20 1850 09/04/20 1028  HGB   < > 11.2*  --   --  11.8*  --   --   --   --  12.9*  HCT  --  36.5*  --   --  37.9*  --   --   --   --  42.0  PLT  --  268  --   --  254  --   --   --   --  280  APTT  --   --   --   --   --   --  >200*  --   --   --   LABPROT  --  43.6*  --   --  48.1*  --   --   --   --   --   INR  --  4.8*  --   --  5.5*  --   --   --   --   --   HEPARINUNFRC  --   --   --    < > 0.92*   < >  --  0.95* 0.58 0.58  CREATININE  --   --  1.17  --  1.19  --   --   --   --  1.42*   < > = values in this interval not displayed.    Estimated Creatinine Clearance: 48.6 mL/min (A) (by C-G formula based on SCr of 1.42 mg/dL (H)).  Assessment: 74 yr old male presented with SOB, cough, found to be covid+ (pt is fully vaccinated).  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 On admission, D-dimer was 10.67 (9/8), then climbed to > 20 for 2 days, now down to 7.95. LE dopplers were done on 9/14 with BL acute DVT; CTA:  no central or segmental pulmonary embolus, unable to   Patient is on warfarin 3 mg daily PTA. INR day prior to admit as outpatient was 2.5. Admission INR was 2.5 >> 3.6 on hospital day 2, then was above 4 ever since (last  warfarin dose was 9/8) CBC has been stable since admission; no bleeding issues.  Pharmacy discussed with Dr Aileen Fass the fact patient has now had acute DVT while with a supratherapeutic INR. He was concerned this was acute phase reactant associated with COVID (since all other markers going up). No change in LFTs. Could also just be warfarin failure in setting of COVID associated thrombosis.  Heparin level continues to be therapeutic today, no issues noted, CBC stable.    Goal of Therapy:  Heparin level 0.3-0.7 units/ml Monitor platelets by anticoagulation protocol: Yes   Plan:  Continue heparin gtt at 900 units/hr Daily heparin level, CBC, s/s bleeding F/u long term [facility] plan   [name]  [name], PharmD Clinical Pharmacist Please check AMION for all [place] numbers 09/04/2020 11:26 AM

## 2020-09-04 NOTE — Progress Notes (Signed)
Physical Therapy Treatment Patient Details Name: [name] MRN: [id number] DOB: 16-Dec-1946 Today's Date: 09/04/2020    History of Present Illness [name] is a 74 y.o. male with medical history significant of nonischemic cardiomyopathy with ejection fraction of 25-30%, status post ICD placement, permanent A. fib/atrial flutter-on Coumadin, hypertension, hyperlipidemia, depression/anxiety, GERD, presents to emergency department with worsening shortness of breath and cough since 1 week. Tested pos for covid.     PT Comments    Pt admitted with above diagnosis. Pt did not tolerate chair long last time pt got up.  Pt initial O2 sats on arrival were 91% on 70% HF oxygen therapy.  This treatment, pt sat EOB 20 min and performed exercises with PT.  Pt able to perform exercises with multiple rest breaks.  Would do 10 reps and rest and then 10 more etc.  Desat initially to 85% on 70% HF oxygen therapy but toward end of treatment after pt was back in bed sats 80% and took incr time to return to 90%.   Pt continues to be deconditioned and tolerates very little activity. Pt currently with functional limitations due to balance and endurance deficits. Pt will benefit from skilled PT to increase their independence and safety with mobility to allow discharge to the venue listed below.     Follow Up Recommendations  Home health PT;Supervision - Intermittent     Equipment Recommendations  None recommended by PT    Recommendations for Other Services       Precautions / Restrictions Precautions Precautions: Fall Restrictions Weight Bearing Restrictions: No    Mobility  Bed Mobility Overal bed mobility: Needs Assistance Bed Mobility: Supine to Sit     Supine to sit: Min guard Sit to supine: Min assist   General bed mobility comments: Needed min guard assist with use of rail to come to EOB and min assist for LEs to get back into bed  Transfers     Transfers: Lateral/Scoot Transfers           Lateral/Scoot Transfers: Min guard General transfer comment: Pt scooted laterally to [facility] prior to lying down with rest breaks  Ambulation/Gait                 Stairs             Wheelchair Mobility    Modified Rankin (Stroke Patients Only)       Balance Overall balance assessment: Needs assistance Sitting-balance support: Feet supported;No upper extremity supported;Bilateral upper extremity supported Sitting balance-[name] Scale: Fair Sitting balance - Comments: Pt sat EOB x 20 minutes and performed exercises with rest breaks.                                     Cognition Arousal/Alertness: Awake/alert Behavior During Therapy: WFL for tasks assessed/performed Overall Cognitive Status: Within Functional Limits for tasks assessed                                        Exercises General Exercises - Lower Extremity Ankle Circles/Pumps: AROM;Both;10 reps;Seated Long Arc Quad: AROM;Both;10 reps;Seated Hip Flexion/Marching: AROM;Both;10 reps;Seated    General Comments General comments (skin integrity, edema, etc.): Pt on HF oxygen therapy-70% with sats as low as 80% with activity.       Pertinent Vitals/Pain Pain Assessment: No/denies  pain    Home Living                      Prior Function            PT Goals (current goals can now be found in the care plan section) Acute Rehab PT Goals Patient Stated Goal: get better Progress towards PT goals: Progressing toward goals    Frequency    Min 3X/week      PT Plan Current plan remains appropriate    Co-evaluation              AM-PAC PT "6 Clicks" Mobility   Outcome Measure  Help needed turning from your back to your side while in a flat bed without using bedrails?: None Help needed moving from lying on your back to sitting on the side of a flat bed without using bedrails?: A Little Help needed moving to and from a bed to a chair (including  a wheelchair)?: A Little Help needed standing up from a chair using your arms (e.g., wheelchair or bedside chair)?: A Little Help needed to walk in hospital room?: A Little Help needed climbing 3-5 steps with a railing? : A Lot 6 Click Score: 18    End of Session Equipment Utilized During Treatment: Oxygen Activity Tolerance: Patient limited by fatigue Patient left: with call bell/phone within reach;in bed;with bed alarm set Nurse Communication: Mobility status PT Visit Diagnosis: Unsteadiness on feet (R26.81);Muscle weakness (generalized) (M62.81);Dizziness and giddiness (R42)     Time: 0175-1025 PT Time Calculation (min) (ACUTE ONLY): 25 min  Charges:  $Therapeutic Exercise: 8-22 mins $Therapeutic Activity: 8-22 mins                     Chaddrick Brue W,PT Acute Rehabilitation Services Pager:  [phone]  Office:  [place] 09/04/2020, 4:37 PM

## 2020-09-05 LAB — C-REACTIVE PROTEIN: CRP: 1.9 mg/dL — ABNORMAL HIGH (ref <1.0)

## 2020-09-05 LAB — BASIC METABOLIC PANEL
Anion gap: 9 (ref 5–15)
BUN: 37 mg/dL — ABNORMAL HIGH (ref 8–23)
CO2: 30 mmol/L (ref 22–32)
Calcium: 8.6 mg/dL — ABNORMAL LOW (ref 8.9–10.3)
Chloride: 99 mmol/L (ref 98–111)
Creatinine, Ser: 1.15 mg/dL (ref 0.61–1.24)
GFR calc Af Amer: >60 mL/min (ref >60)
GFR calc non Af Amer: >60 mL/min (ref >60)
Glucose, Bld: 96 mg/dL (ref 70–99)
Potassium: 4 mmol/L (ref 3.5–5.1)
Sodium: 138 mmol/L (ref 135–145)

## 2020-09-05 LAB — CBC
HCT: 39 % (ref 39.0–52.0)
Hemoglobin: 12.3 g/dL — ABNORMAL LOW (ref 13.0–17.0)
MCH: 25.8 pg — ABNORMAL LOW (ref 26.0–34.0)
MCHC: 31.5 g/dL (ref 30.0–36.0)
MCV: 81.8 fL (ref 80.0–100.0)
Platelets: 229 10*3/uL (ref 150–400)
RBC: 4.77 MIL/uL (ref 4.22–5.81)
RDW: 15.6 % — ABNORMAL HIGH (ref 11.5–15.5)
WBC: 19.1 10*3/uL — ABNORMAL HIGH (ref 4.0–10.5)
nRBC: 0 % (ref 0.0–0.2)

## 2020-09-05 LAB — PROTIME-INR
INR: 3.8 — ABNORMAL HIGH (ref 0.8–1.2)
Prothrombin Time: 36 seconds — ABNORMAL HIGH (ref 11.4–15.2)

## 2020-09-05 LAB — D-DIMER, QUANTITATIVE: D-Dimer, Quant: 4.13 ug/mL-FEU — ABNORMAL HIGH (ref 0.00–0.50)

## 2020-09-05 LAB — HEPARIN LEVEL (UNFRACTIONATED): Heparin Unfractionated: 0.49 IU/mL (ref 0.30–0.70)

## 2020-09-05 NOTE — Progress Notes (Signed)
PROGRESS NOTE  [name] WFU:[id number] DOB: 08-28-1946 DOA: 08/25/2020 PCP: [name], MD   LOS: 10 days   Brief Narrative / Interim history: [name] is an 74 y.o. male past medical history significant for nonischemic cardiomyopathy with an EF of 25% status post AICD, permanent atrial fibrillation/atrial flutter on Coumadin who is fully vaccinated in March went [place] several days prior to admission started developing shortness of breath at rest with cough nasal congestion loose stools and generalized weakness.  In the ED was found tachypneic hypoxic placed on 2 L of oxygen SARS-CoV-2 PCR was positive tropes 25 D-dimer 1.6 BNP of 425 creatinine at baseline chest x-ray showed bilateral diffuse groundglass opacities asymmetric.  Subjective / 24h Interval events: He feels about the same this morning, no chest pain, abdominal pain, nausea or vomiting. Feels short winded with activity.  Assessment & Plan:  Principal Problem Acute Hypoxic Respiratory Failure due to Covid-19 Viral Illness -Remains on heated high flow, still on 25 L and 70% FiO2 FiO2 (%):  [70 %] 70 %  -Remains high risk for decompensation, closely monitor -D-dimer, CRP both appear to be improving today -Patient has refused Actemra and baricitinib but he completed remdesivir -Continue steroids I would favor to prolong course  COVID-19 Labs  Recent Labs    09/03/20 0249 09/04/20 1028 09/05/20 0635  DDIMER 7.54* 5.86* 4.13*  CRP 5.4* 3.3* 1.9*    Lab Results  Component Value Date   SARSCOV2NAA POSITIVE (A) 08/26/2020   [place] NEGATIVE 04/21/2020    Active Problems Acute bilateral lower extremity DVT -this was despite being on Coumadin.  Currently on heparin infusion, no bleeding, continue.  When closer to discharge will need to transition to [place]. His INR remains elevated  Acute on chronic systolic CHF-he is net negative about 7 L.  His losartan and Lasix are on hold,  continue to hold, creatinine 1.1 this morning. Anticipate will start Lasix back again tomorrow  Permanent A. fib-on heparin, metoprolol  Chronic kidney disease stage IIIa-fluctuates daily but overall at baseline  CAD status post PCI-continue statin, metoprolol, losartan, aspirin  Hyperlipidemia-continue statins  Small amount of black stool-resolved, he does have thrombosed external hemorrhoids on colonoscopy in 2017.  No further bleeding  In-hospital delirium-resolved   Oral thrush - continue IV Diflucan for 7 days  Scheduled Meds: . albuterol  2 puff Inhalation TID  . vitamin C  500 mg Oral Daily  . digoxin  0.125 mg Oral Daily  . fenofibrate  160 mg Oral Daily  . ferrous sulfate  325 mg Oral BID  . folic acid  1 mg Oral QPM  . methylPREDNISolone (SOLU-MEDROL) injection  40 mg Intravenous Q12H  . metoprolol succinate  25 mg Oral Daily  . pantoprazole  40 mg Oral BID  . pravastatin  40 mg Oral Daily  . sertraline  150 mg Oral Q1400  . zinc sulfate  220 mg Oral Daily   Continuous Infusions: . sodium chloride    . fluconazole (DIFLUCAN) IV 100 mg (09/04/20 0953)  . heparin 900 Units/hr (09/03/20 1248)   PRN Meds:.sodium chloride, acetaminophen, antiseptic oral rinse, chlorpheniramine-HYDROcodone, haloperidol lactate, nitroGLYCERIN, ondansetron **OR** ondansetron (ZOFRAN) IV, sodium chloride, traMADol-acetaminophen, zolpidem  DVT prophylaxis: heparin  Code Status: Full code Family Communication: called wife Santiago Glad [phone]   Status is: Inpatient  Remains inpatient appropriate because:Inpatient level of care appropriate due to severity of illness  Dispo: The patient is from: Home  Anticipated d/c is to: Home              Anticipated d/c date is: > 3 days              Patient currently is not medically stable to d/c.  Consultants:  None   Procedures:  None   Microbiology: None   Antibacterials: None    Objective: Vitals:   09/05/20 0331  09/05/20 0500 09/05/20 0838 09/05/20 0948  BP: 123/72  126/86   Pulse: 71  69   Resp: 20  20   Temp: 97.7 F (36.5 C)  (!) 97.5 F (36.4 C)   TempSrc: Oral  Oral   SpO2: 93% 91% 91% 92%  Weight: 86.2 kg     Height:        Intake/Output Summary (Last 24 hours) at 09/05/2020 1026 Last data filed at 09/05/2020 0500 Gross per 24 hour  Intake 710.8 ml  Output 751 ml  Net -40.2 ml   Filed Weights   09/03/20 0506 09/04/20 0545 09/05/20 0331  Weight: 85.3 kg 83.3 kg 86.2 kg    Examination: Constitutional: In bed, no apparent distress Eyes: No scleral icterus ENMT: mmm  Neck: normal, supple Respiratory: Diffuse bibasilar rhonchi, no wheezing, no crackles Cardiovascular: Regular rate and rhythm, no murmurs, trace edema Abdomen: Soft, nontender, nondistended, positive bowel sounds Musculoskeletal: no clubbing / cyanosis.  Skin: No rashes appreciated Neurologic: Nonfocal, equal strength  Data Reviewed: I have independently reviewed following labs and imaging studies   CBC: Recent Labs  Lab 08/29/20 1530 08/29/20 1530 08/30/20 0708 08/30/20 0708 08/31/20 1131 09/02/20 0030 09/03/20 0249 09/04/20 1028 09/05/20 0635  WBC 17.2*   < > 14.1*   < > 17.3* 15.3* 16.9* 21.6* 19.1*  NEUTROABS 15.5*  --  12.5*  --  15.7*  --   --   --   --   HGB 11.3*   < > 11.0*   < > 12.6* 11.2* 11.8* 12.9* 12.3*  HCT 36.1*   < > 34.6*   < > 39.4 36.5* 37.9* 42.0 39.0  MCV 80.8   < > 79.5*   < > 80.7 82.4 80.3 81.4 81.8  PLT 376   < > 329   < > 339 268 254 280 229   < > = values in this interval not displayed.   Basic Metabolic Panel: Recent Labs  Lab 08/30/20 0708 08/30/20 0708 08/31/20 1131 09/02/20 0857 09/03/20 0249 09/04/20 1028 09/05/20 0635  NA 141   < > 142 138 140 137 138  K 3.5   < > 3.5 3.8 3.8 3.7 4.0  CL 100   < > 99 98 100 99 99  CO2 29   < > 30 27 27 26 30   GLUCOSE 139*   < > 121* 87 147* 137* 96  BUN 49*   < > 46* 41* 37* 36* 37*  CREATININE 1.53*   < > 1.42* 1.17  1.19 1.42* 1.15  CALCIUM 8.7*   < > 8.5* 8.4* 8.5* 8.8* 8.6*  MG 2.0  --  2.2  --   --   --   --   PHOS 4.1  --  2.9  --   --   --   --    < > = values in this interval not displayed.   GFR: Estimated Creatinine Clearance: 60 mL/min (by C-G formula based on SCr of 1.15 mg/dL). Liver Function Tests: Recent Labs  Lab 08/30/20 0708 08/31/20 1131 09/03/20  0800  AST 20 24 24   ALT 16 14 11   ALKPHOS 47 55 57  BILITOT 0.9 0.7 0.9  PROT 6.0* 6.0* 5.9*  ALBUMIN 2.3* 2.3* 2.2*   No results for input(s): LIPASE, AMYLASE in the last 168 hours. No results for input(s): AMMONIA in the last 168 hours. Coagulation Profile: Recent Labs  Lab 09/01/20 0756 09/02/20 0030 09/03/20 0249 09/04/20 1028 09/05/20 0635  INR 4.0* 4.8* 5.5* 5.8* 3.8*   Cardiac Enzymes: No results for input(s): CKTOTAL, CKMB, CKMBINDEX, TROPONINI in the last 168 hours. BNP (last 3 results) No results for input(s): PROBNP in the last 8760 hours. HbA1C: No results for input(s): HGBA1C in the last 72 hours. CBG: Recent Labs  Lab 09/02/20 1112  GLUCAP 99   Lipid Profile: No results for input(s): CHOL, HDL, LDLCALC, TRIG, CHOLHDL, LDLDIRECT in the last 72 hours. Thyroid Function Tests: No results for input(s): TSH, T4TOTAL, FREET4, T3FREE, THYROIDAB in the last 72 hours. Anemia Panel: No results for input(s): VITAMINB12, FOLATE, FERRITIN, TIBC, IRON, RETICCTPCT in the last 72 hours. Urine analysis:    Component Value Date/Time   COLORURINE AMBER (A) 09/29/2017 2310   APPEARANCEUR HAZY (A) 09/29/2017 2310   LABSPEC 1.013 09/29/2017 2310   PHURINE 5.0 09/29/2017 2310   GLUCOSEU NEGATIVE 09/29/2017 2310   HGBUR NEGATIVE 09/29/2017 [place] 09/29/2017 2310   KETONESUR NEGATIVE 09/29/2017 2310   PROTEINUR NEGATIVE 09/29/2017 2310   NITRITE NEGATIVE 09/29/2017 2310   LEUKOCYTESUR NEGATIVE 09/29/2017 2310   Sepsis Labs: Invalid input(s): PROCALCITONIN, LACTICIDVEN  Recent Results (from  the past 240 hour(s))  Blood Culture (routine x 2)     Status: None   Collection Time: 08/26/20 11:36 AM   Specimen: BLOOD  Result Value Ref Range Status   Specimen Description BLOOD RIGHT ANTECUBITAL  Final   Special Requests   Final    BOTTLES DRAWN AEROBIC AND ANAEROBIC Blood Culture adequate volume   Culture   Final    NO GROWTH 7 DAYS Performed at [facility], [address], [place], [place] [address]    Report Status 09/02/2020 FINAL  Final  Blood Culture (routine x 2)     Status: None   Collection Time: 08/27/20  4:56 AM   Specimen: BLOOD LEFT FOREARM  Result Value Ref Range Status   Specimen Description BLOOD LEFT FOREARM  Final   Special Requests   Final    BOTTLES DRAWN AEROBIC AND ANAEROBIC Blood Culture results may not be optimal due to an inadequate volume of blood received in culture bottles   Culture   Final    NO GROWTH 5 DAYS Performed at [facility], [address], [place], [place] [address]    Report Status 09/01/2020 FINAL  Final      Radiology Studies: No results found.  Marzetta Board, MD, PhD [facility]  Between 7 am - 7 pm I am available, please contact me via Amion or Securechat  Between 7 pm - 7 am I am not available, please contact night coverage MD/APP via Amion

## 2020-09-05 NOTE — Progress Notes (Signed)
[place] for Heparin Indication: DVT and r/o PE  Allergies  Allergen Reactions  . Keflex [Cephalexin] Shortness Of Breath and Other (See Comments)    dizziness    Patient Measurements: Height: 5\' 10"  (177.8 cm) Weight: 86.2 kg (190 lb 0.6 oz) IBW/kg (Calculated) : 73 Heparin Dosing Weight: 85 kg  Vital Signs: Temp: 97.7 F (36.5 C) (09/18 0331) Temp Source: Oral (09/18 0331) BP: 123/72 (09/18 0331) Pulse Rate: 71 (09/18 0331)  Labs: Recent Labs    09/02/20 0857 09/02/20 1025 09/03/20 0249 09/03/20 0249 09/03/20 0800 09/03/20 1123 09/03/20 1850 09/04/20 1028 09/05/20 0635  HGB  --   --  11.8*   < >  --   --   --  12.9* 12.3*  HCT  --   --  37.9*  --   --   --   --  42.0 39.0  PLT  --   --  254  --   --   --   --  280 229  APTT  --   --   --   --  >200*  --   --   --   --   LABPROT  --   --  48.1*  --   --   --   --  50.3*  --   INR  --   --  5.5*  --   --   --   --  5.8*  --   HEPARINUNFRC  --    < > 0.92*  --   --    < > 0.58 0.58 0.49  CREATININE 1.17  --  1.19  --   --   --   --  1.42*  --    < > = values in this interval not displayed.    Estimated Creatinine Clearance: 48.6 mL/min (A) (by C-G formula based on SCr of 1.42 mg/dL (H)).  Assessment: 74 yr old male presented with SOB, cough, found to be covid+ (pt is fully vaccinated).  Patient is on warfarin 3 mg daily PTA. INR day prior to admit as outpatient was 2.5. Admission INR was 2.5 >> 3.6 on hospital day 2, then was above 4 ever since (last warfarin dose was 9/8)  Pharmacy discussed with Dr Aileen Fass the fact patient has now had acute DVT while with a supratherapeutic INR. He was concerned this was acute phase reactant associated with COVID (since all other markers going up). No change in LFTs. Could also just be warfarin failure in setting of COVID associated thrombosis.  Heparin level continues to be therapeutic today, no issues with IV or bleeding per  nursing, CBC stable.    Goal of Therapy:  Heparin level 0.3-0.7 units/ml Monitor platelets by anticoagulation protocol: Yes   Plan:  Continue heparin gtt at 900 units/hr Daily heparin level, CBC, s/s bleeding F/u long term [facility] plan   Norina Buzzard, PharmD PGY1 Pharmacy Resident 09/05/2020 7:50 AM  Please check AMION for all [place] numbers

## 2020-09-05 NOTE — Plan of Care (Signed)
  Problem: Clinical Measurements: Goal: Respiratory complications will improve Outcome: Progressing   Problem: Activity: Goal: Risk for activity intolerance will decrease Outcome: Progressing   

## 2020-09-06 LAB — CBC
HCT: 40 % (ref 39.0–52.0)
Hemoglobin: 12.4 g/dL — ABNORMAL LOW (ref 13.0–17.0)
MCH: 25.3 pg — ABNORMAL LOW (ref 26.0–34.0)
MCHC: 31 g/dL (ref 30.0–36.0)
MCV: 81.5 fL (ref 80.0–100.0)
Platelets: 255 10*3/uL (ref 150–400)
RBC: 4.91 MIL/uL (ref 4.22–5.81)
RDW: 15.8 % — ABNORMAL HIGH (ref 11.5–15.5)
WBC: 19.7 10*3/uL — ABNORMAL HIGH (ref 4.0–10.5)
nRBC: 0 % (ref 0.0–0.2)

## 2020-09-06 LAB — BASIC METABOLIC PANEL
Anion gap: 9 (ref 5–15)
BUN: 31 mg/dL — ABNORMAL HIGH (ref 8–23)
CO2: 26 mmol/L (ref 22–32)
Calcium: 8.5 mg/dL — ABNORMAL LOW (ref 8.9–10.3)
Chloride: 100 mmol/L (ref 98–111)
Creatinine, Ser: 1.07 mg/dL (ref 0.61–1.24)
GFR calc Af Amer: >60 mL/min (ref >60)
GFR calc non Af Amer: >60 mL/min (ref >60)
Glucose, Bld: 136 mg/dL — ABNORMAL HIGH (ref 70–99)
Potassium: 4.2 mmol/L (ref 3.5–5.1)
Sodium: 135 mmol/L (ref 135–145)

## 2020-09-06 LAB — C-REACTIVE PROTEIN: CRP: 0.7 mg/dL (ref <1.0)

## 2020-09-06 LAB — PROTIME-INR
INR: 2.7 — ABNORMAL HIGH (ref 0.8–1.2)
Prothrombin Time: 27.9 seconds — ABNORMAL HIGH (ref 11.4–15.2)

## 2020-09-06 LAB — HEPARIN LEVEL (UNFRACTIONATED): Heparin Unfractionated: 0.36 IU/mL (ref 0.30–0.70)

## 2020-09-06 MED ORDER — AYR SALINE NASAL NA GEL
1.0000 "application " | Freq: Four times a day (QID) | NASAL | Status: DC
  Administered 2020-09-06 – 2020-09-27 (×70): 1 via NASAL
  Filled 2020-09-06 (×6): qty 14.1

## 2020-09-06 MED ORDER — OXYMETAZOLINE HCL 0.05 % NA SOLN
1.0000 | Freq: Two times a day (BID) | NASAL | Status: DC
  Administered 2020-09-06 – 2020-09-28 (×44): 1 via NASAL
  Filled 2020-09-06: qty 30

## 2020-09-06 MED ORDER — SALINE SPRAY 0.65 % NA SOLN
1.0000 | Freq: Four times a day (QID) | NASAL | Status: DC
  Administered 2020-09-06 – 2020-09-28 (×73): 1 via NASAL
  Filled 2020-09-06: qty 44

## 2020-09-06 MED ORDER — HYDROCORTISONE ACETATE 25 MG RE SUPP
25.0000 mg | Freq: Two times a day (BID) | RECTAL | Status: DC
  Administered 2020-09-06 – 2020-09-28 (×39): 25 mg via RECTAL
  Filled 2020-09-06 (×44): qty 1

## 2020-09-06 NOTE — Progress Notes (Signed)
[place] for Heparin Indication: DVT and r/o PE  Allergies  Allergen Reactions  . Keflex [Cephalexin] Shortness Of Breath and Other (See Comments)    dizziness    Patient Measurements: Height: 5\' 10"  (177.8 cm) Weight: 85.5 kg (188 lb 7.9 oz) IBW/kg (Calculated) : 74 Heparin Dosing Weight: 85 kg  Vital Signs: Temp: 98.2 F (36.8 C) (09/19 1234) Temp Source: Oral (09/19 1234) BP: 142/69 (09/19 1234) Pulse Rate: 69 (09/19 1234)  Labs: Recent Labs    09/04/20 1028 09/04/20 1028 09/05/20 0635 09/06/20 1805  HGB 12.9*   < > 12.3* 12.4*  HCT 42.0  --  39.0 40.0  PLT 280  --  229 255  LABPROT 50.3*  --  36.0* 27.9*  INR 5.8*  --  3.8* 2.7*  HEPARINUNFRC 0.58  --  0.49 0.36  CREATININE 1.42*  --  1.15 1.07   < > = values in this interval not displayed.    Estimated Creatinine Clearance: 64.4 mL/min (by C-G formula based on SCr of 1.07 mg/dL).  Assessment: 74 yr old male presented with SOB, cough, found to be covid+ (pt is fully vaccinated).  Patient is on warfarin 3 mg daily PTA. INR day prior to admit as outpatient was 2.5. Admission INR was 2.5 >> 3.6 on hospital day 2, then was above 4 ever since (last warfarin dose was 9/8)  Pharmacy discussed with Dr Aileen Fass the fact patient has now had acute DVT while with a supratherapeutic INR. He was concerned this was acute phase reactant associated with COVID (since all other markers going up). No change in LFTs. Could also just be warfarin failure in setting of COVID associated thrombosis.  Heparin level 0.36 continues to be therapeutic today on heparin drip rate 900 uts/hr, no issues with IV or bleeding per nursing, CBC stable.    Goal of Therapy:  Heparin level 0.3-0.7 units/ml Monitor platelets by anticoagulation protocol: Yes   Plan:  Continue heparin gtt at 900 units/hr Daily heparin level, CBC, s/s bleeding F/u long term [facility] plan   Bonnita Nasuti Pharm.D. CPP, BCPS Clinical  Pharmacist [phone] 09/06/2020 6:50 PM    Please check AMION for all [place] numbers

## 2020-09-06 NOTE — Progress Notes (Signed)
   09/06/20 0911  Assess: MEWS Score  Temp 98.2 F (36.8 C)  BP (!) 100/47  Pulse Rate 68  Resp (!) 21  Level of Consciousness Alert  SpO2 90 %  O2 Device HFNC  Patient Activity (if Appropriate) In bed  Heater temperature 91 F (32.8 C)  O2 Flow Rate (L/min) 25 L/min  FiO2 (%) 70 %  Assess: MEWS Score  MEWS Temp 0  MEWS Systolic 1  MEWS Pulse 0  MEWS RR 1  MEWS LOC 0  MEWS Score 2  MEWS Score Color Yellow  Assess: if the MEWS score is Yellow or Red  Were vital signs taken at a resting state? Yes  Focused Assessment No change from prior assessment  Early Detection of Sepsis Score *See Row Information* Low  MEWS guidelines implemented *See Row Information* No, previously yellow, continue vital signs every 4 hours  Treat  Pain Scale 0-10  Pain Score 0   Patients respirations are elevated while eating and taking medications. Will continue to monitor RR at rest. Oxygen saturations remain stable so no indication to increase oxygen flow.

## 2020-09-06 NOTE — Progress Notes (Signed)
PROGRESS NOTE  [name]:[id number] DOB: 07-Oct-1946 DOA: 08/25/2020 PCP: [name], MD   LOS: 11 days   Brief Narrative / Interim history: [name] is an 74 y.o. male past medical history significant for nonischemic cardiomyopathy with an EF of 25% status post AICD, permanent atrial fibrillation/atrial flutter on Coumadin who is fully vaccinated in March [place] several days prior to admission started developing shortness of breath at rest with cough nasal congestion loose stools and generalized weakness.  In the ED was found tachypneic hypoxic placed on 2 L of oxygen SARS-CoV-2 PCR was positive tropes 25 D-dimer 1.6 BNP of 425 creatinine at baseline chest x-ray showed bilateral diffuse groundglass opacities asymmetric.  Subjective / 24h Interval events: Feels overall well, very short of breath with activity but he is okay at rest.  No chest pain, no abdominal pain, no nausea or vomiting  Assessment & Plan:  Principal Problem Acute Hypoxic Respiratory Failure due to Covid-19 Viral Illness -Remains on heated high flow, slightly improved with 20 L and 60% FiO2 FiO2 (%):  [60 %-70 %] 60 %  -Remains high risk for decompensation, closely monitor -D-dimer, CRP both appears to be improving, however labs today unfortunately are not drawn yet -Patient has refused Actemra and baricitinib but he completed remdesivir -Continue steroids I would favor to prolong course  COVID-19 Labs  Recent Labs    09/04/20 1028 09/05/20 0635  DDIMER 5.86* 4.13*  CRP 3.3* 1.9*    Lab Results  Component Value Date   SARSCOV2NAA POSITIVE (A) 08/26/2020   [place] NEGATIVE 04/21/2020    Active Problems Acute bilateral lower extremity DVT -this was despite being on Coumadin.  Currently on heparin infusion, no bleeding, continue.  When closer to discharge will need to transition to [place]. His INR remains elevated  Acute on chronic systolic CHF-he is net  negative about 7 L.  His losartan and Lasix are on hold, continue to hold, creatinine 1.1 this morning.  Awaiting creatinine today to resume Lasix w  Permanent A. fib-on heparin, metoprolol  Chronic kidney disease stage IIIa-fluctuates daily but overall at baseline  CAD status post PCI-continue statin, metoprolol, losartan, aspirin  Hyperlipidemia-continue statins  Small amount of black stool-he does have thrombosed external hemorrhoids on colonoscopy in 2017.  Scant blood noted by the nurse.  Will order suppository for hemorrhoids  In-hospital delirium-resolved   Oral thrush - continue IV Diflucan for 7 days  Scheduled Meds: . albuterol  2 puff Inhalation TID  . vitamin C  500 mg Oral Daily  . digoxin  0.125 mg Oral Daily  . fenofibrate  160 mg Oral Daily  . ferrous sulfate  325 mg Oral BID  . folic acid  1 mg Oral QPM  . hydrocortisone  25 mg Rectal BID  . methylPREDNISolone (SOLU-MEDROL) injection  40 mg Intravenous Q12H  . metoprolol succinate  25 mg Oral Daily  . oxymetazoline  1 spray Each Nare BID  . pantoprazole  40 mg Oral BID  . pravastatin  40 mg Oral Daily  . saline  1 application Each Nare V6H  . sertraline  150 mg Oral Q1400  . sodium chloride  1 spray Each Nare Q6H  . zinc sulfate  220 mg Oral Daily   Continuous Infusions: . sodium chloride    . fluconazole (DIFLUCAN) IV 100 mg (09/05/20 1249)  . heparin 900 Units/hr (09/05/20 2200)   PRN Meds:.sodium chloride, acetaminophen, antiseptic oral rinse, chlorpheniramine-HYDROcodone, haloperidol lactate,  nitroGLYCERIN, ondansetron **OR** ondansetron (ZOFRAN) IV, traMADol-acetaminophen, zolpidem  DVT prophylaxis: heparin  Code Status: Full code Family Communication: called wife Santiago Glad [phone] 9/18, 9/19  Status is: Inpatient  Remains inpatient appropriate because:Inpatient level of care appropriate due to severity of illness  Dispo: The patient is from: Home              Anticipated d/c is to: Home               Anticipated d/c date is: > 3 days              Patient currently is not medically stable to d/c.  Consultants:  None   Procedures:  None   Microbiology: None   Antibacterials: None    Objective: Vitals:   09/06/20 0911 09/06/20 1234 09/06/20 1300 09/06/20 1301  BP: (!) 100/47 (!) 142/69    Pulse: 68 69    Resp: (!) 21 18    Temp: 98.2 F (36.8 C) 98.2 F (36.8 C)    TempSrc: Oral Oral    SpO2: 90% 90% 91% 92%  Weight:      Height:        Intake/Output Summary (Last 24 hours) at 09/06/2020 1347 Last data filed at 09/06/2020 0900 Gross per 24 hour  Intake 1199.85 ml  Output 1250 ml  Net -50.15 ml   Filed Weights   09/04/20 0545 09/05/20 0331 09/06/20 0447  Weight: 83.3 kg 86.2 kg 85.5 kg    Examination: Constitutional: In bed, no distress Eyes: No icterus ENMT: Moist mucous membranes Neck: normal, supple Respiratory: Diffuse bibasilar rhonchi, no wheezing, no crackles Cardiovascular: Regular rate and rhythm, no murmurs appreciated.  Trace edema Abdomen: Soft, nontender, nondistended, bowel sounds positive Musculoskeletal: no clubbing / cyanosis.  Skin: No rashes appreciated Neurologic: No focal deficits, equal strength  Data Reviewed: I have independently reviewed following labs and imaging studies   CBC: Recent Labs  Lab 08/31/20 1131 09/02/20 0030 09/03/20 0249 09/04/20 1028 09/05/20 0635  WBC 17.3* 15.3* 16.9* 21.6* 19.1*  NEUTROABS 15.7*  --   --   --   --   HGB 12.6* 11.2* 11.8* 12.9* 12.3*  HCT 39.4 36.5* 37.9* 42.0 39.0  MCV 80.7 82.4 80.3 81.4 81.8  PLT 339 268 254 280 622   Basic Metabolic Panel: Recent Labs  Lab 08/31/20 1131 09/02/20 0857 09/03/20 0249 09/04/20 1028 09/05/20 0635  NA 142 138 140 137 138  K 3.5 3.8 3.8 3.7 4.0  CL 99 98 100 99 99  CO2 30 27 27 26 30   GLUCOSE 121* 87 147* 137* 96  BUN 46* 41* 37* 36* 37*  CREATININE 1.42* 1.17 1.19 1.42* 1.15  CALCIUM 8.5* 8.4* 8.5* 8.8* 8.6*  MG 2.2  --   --   --   --    PHOS 2.9  --   --   --   --    GFR: Estimated Creatinine Clearance: 60 mL/min (by C-G formula based on SCr of 1.15 mg/dL). Liver Function Tests: Recent Labs  Lab 08/31/20 1131 09/03/20 0800  AST 24 24  ALT 14 11  ALKPHOS 55 57  BILITOT 0.7 0.9  PROT 6.0* 5.9*  ALBUMIN 2.3* 2.2*   No results for input(s): LIPASE, AMYLASE in the last 168 hours. No results for input(s): AMMONIA in the last 168 hours. Coagulation Profile: Recent Labs  Lab 09/01/20 0756 09/02/20 0030 09/03/20 0249 09/04/20 1028 09/05/20 0635  INR 4.0* 4.8* 5.5* 5.8* 3.8*   Cardiac Enzymes:  No results for input(s): CKTOTAL, CKMB, CKMBINDEX, TROPONINI in the last 168 hours. BNP (last 3 results) No results for input(s): PROBNP in the last 8760 hours. HbA1C: No results for input(s): HGBA1C in the last 72 hours. CBG: Recent Labs  Lab 09/02/20 1112  GLUCAP 99   Lipid Profile: No results for input(s): CHOL, HDL, LDLCALC, TRIG, CHOLHDL, LDLDIRECT in the last 72 hours. Thyroid Function Tests: No results for input(s): TSH, T4TOTAL, FREET4, T3FREE, THYROIDAB in the last 72 hours. Anemia Panel: No results for input(s): VITAMINB12, FOLATE, FERRITIN, TIBC, IRON, RETICCTPCT in the last 72 hours. Urine analysis:    Component Value Date/Time   COLORURINE AMBER (A) 09/29/2017 2310   APPEARANCEUR HAZY (A) 09/29/2017 2310   LABSPEC 1.013 09/29/2017 2310   PHURINE 5.0 09/29/2017 2310   GLUCOSEU NEGATIVE 09/29/2017 2310   HGBUR NEGATIVE 09/29/2017 [place] 09/29/2017 2310   KETONESUR NEGATIVE 09/29/2017 2310   PROTEINUR NEGATIVE 09/29/2017 2310   NITRITE NEGATIVE 09/29/2017 2310   LEUKOCYTESUR NEGATIVE 09/29/2017 2310   Sepsis Labs: Invalid input(s): PROCALCITONIN, LACTICIDVEN  No results found for this or any previous visit (from the past 240 hour(s)).    Radiology Studies: No results found.  Marzetta Board, MD, PhD [facility]  Between 7 am - 7 pm I am available, please  contact me via Amion or Securechat  Between 7 pm - 7 am I am not available, please contact night coverage MD/APP via Amion

## 2020-09-06 NOTE — Progress Notes (Signed)
Patient having discomfort and pain in nostrils due to heated high flow oxygen. MD ordered additional nasal spray and nasal gel to help loosen dried blood and mucous in nose. Patient placed on NRB per RT consult to give his nose a break. Oxygen splitter set up so heated high flow can be easily accessed when patient is ready to eat. RR of 18 and oxygen saturations of 93-98% of NRB

## 2020-09-07 LAB — CBC
HCT: 37.5 % — ABNORMAL LOW (ref 39.0–52.0)
Hemoglobin: 11.7 g/dL — ABNORMAL LOW (ref 13.0–17.0)
MCH: 25.3 pg — ABNORMAL LOW (ref 26.0–34.0)
MCHC: 31.2 g/dL (ref 30.0–36.0)
MCV: 81 fL (ref 80.0–100.0)
Platelets: 226 10*3/uL (ref 150–400)
RBC: 4.63 MIL/uL (ref 4.22–5.81)
RDW: 15.8 % — ABNORMAL HIGH (ref 11.5–15.5)
WBC: 25 10*3/uL — ABNORMAL HIGH (ref 4.0–10.5)
nRBC: 0 % (ref 0.0–0.2)

## 2020-09-07 LAB — HEPARIN LEVEL (UNFRACTIONATED)
Heparin Unfractionated: 0.1 IU/mL — ABNORMAL LOW (ref 0.30–0.70)
Heparin Unfractionated: 0.52 IU/mL (ref 0.30–0.70)

## 2020-09-07 LAB — PROTIME-INR
INR: 2 — ABNORMAL HIGH (ref 0.8–1.2)
Prothrombin Time: 22 seconds — ABNORMAL HIGH (ref 11.4–15.2)

## 2020-09-07 MED ORDER — METHYLPREDNISOLONE SODIUM SUCC 40 MG IJ SOLR
40.0000 mg | Freq: Every day | INTRAMUSCULAR | Status: DC
  Administered 2020-09-08 – 2020-09-11 (×4): 40 mg via INTRAVENOUS
  Filled 2020-09-07 (×4): qty 1

## 2020-09-07 MED ORDER — FUROSEMIDE 40 MG PO TABS
40.0000 mg | ORAL_TABLET | Freq: Every day | ORAL | Status: DC
  Administered 2020-09-07 – 2020-09-15 (×9): 40 mg via ORAL
  Filled 2020-09-07 (×9): qty 1

## 2020-09-07 NOTE — Progress Notes (Signed)
Physical Therapy Treatment Patient Details Name: [name] MRN: [id number] DOB: 26-Mar-1946 Today's Date: 09/07/2020    History of Present Illness [name] is a 74 y.o. male with medical history significant of nonischemic cardiomyopathy with ejection fraction of 25-30%, status post ICD placement, permanent A. fib/atrial flutter-on Coumadin, hypertension, hyperlipidemia, depression/anxiety, GERD, presents to emergency department with worsening shortness of breath and cough since 1 week. Tested pos for covid.     PT Comments    Pt admitted with above diagnosis. On arrival, sats 94% on 40% HF oxygen therapy.   Pt was assisted to EOB and was doing well with sats low 90's.  Pt transferred to chair with min assist with RW.  Desat to low 70's on 40% HF oxygen therapy and took incr time to recover with pt DOE 4/4. Called nurse/RT and RT came in and  incr his O2 to 100% ultimately to incr sats and took 15 minutes for pt to have sats 93%.  Ended up assisting pt back to bed.  Nurse aware and RT stated they would come back and check on pt.    Pt currently with functional limitations due to balance and endurance deficits. Pt will benefit from skilled PT to increase their independence and safety with mobility to allow discharge to the venue listed below.     Follow Up Recommendations  SNF;Supervision/Assistance - 24 hour     Equipment Recommendations  None recommended by PT    Recommendations for Other Services       Precautions / Restrictions Precautions Precautions: Fall Restrictions Weight Bearing Restrictions: No    Mobility  Bed Mobility Overal bed mobility: Needs Assistance Bed Mobility: Supine to Sit     Supine to sit: Min guard Sit to supine: Min assist   General bed mobility comments: Needed min guard assist with use of rail to come to EOB and min assist for LEs to get back into bed  Transfers Overall transfer level: Needs assistance Equipment used: Rolling walker (2  wheeled)   Sit to Stand: Mod assist;From elevated surface Stand pivot transfers: Min assist;From elevated surface       General transfer comment: min A to steady and increased time needed.  Noted BM on pad and pt wet with urine as well due to spilling urinal. Cleaned pt upon standing and changed his gown once in chair.  Pt stood about 1 min and 15 seconds to be cleaned and to step around to the recliner.  Once in recliner, pt noted to have some SOB.  Sats were in low 70's with pt on 40% HF oxygen therapy.  After pt was not recovering for about 3 min, called nurse/RT and RT promptly came into room.  RT turned oxygen therapy up to 60% and then 70% but pt still staying in mid 80's therefore RT had to incr to 100% to get sats to come to 88% and above and this took about 15 min.  Ended up transferring pt back to bed given that pt decompensated so much wtih OOB. Nurse aware as well.  Pt sats 93% on departure and RT was coming back to check on pt.    Ambulation/Gait                 Stairs             Wheelchair Mobility    Modified Rankin (Stroke Patients Only)       Balance Overall balance assessment: Needs assistance Sitting-balance support: Feet  supported;No upper extremity supported;Bilateral upper extremity supported Sitting balance-[name] Scale: Fair     Standing balance support: During functional activity;Bilateral upper extremity supported Standing balance-[name] Scale: Poor Standing balance comment: relies on UE support for balance on RW and external support                             Cognition Arousal/Alertness: Awake/alert Behavior During Therapy: WFL for tasks assessed/performed Overall Cognitive Status: Within Functional Limits for tasks assessed                                        Exercises      General Comments        Pertinent Vitals/Pain Pain Assessment: No/denies pain    Home Living                       Prior Function            PT Goals (current goals can now be found in the care plan section) Acute Rehab PT Goals Patient Stated Goal: get better Progress towards PT goals: Not progressing toward goals - comment (Difficulty with desaturation, may need SNF)    Frequency    Min 3X/week      PT Plan Discharge plan needs to be updated    Co-evaluation              AM-PAC PT "6 Clicks" Mobility   Outcome Measure  Help needed turning from your back to your side while in a flat bed without using bedrails?: None Help needed moving from lying on your back to sitting on the side of a flat bed without using bedrails?: A Little Help needed moving to and from a bed to a chair (including a wheelchair)?: A Little Help needed standing up from a chair using your arms (e.g., wheelchair or bedside chair)?: A Little Help needed to walk in hospital room?: Total Help needed climbing 3-5 steps with a railing? : Total 6 Click Score: 15    End of Session Equipment Utilized During Treatment: Oxygen;Gait belt Activity Tolerance: Patient limited by fatigue Patient left: with call bell/phone within reach;in bed;with bed alarm set Nurse Communication: Mobility status PT Visit Diagnosis: Unsteadiness on feet (R26.81);Muscle weakness (generalized) (M62.81);Dizziness and giddiness (R42)     Time: 6063-0160 PT Time Calculation (min) (ACUTE ONLY): 58 min  Charges:  $Therapeutic Activity: 38-52 mins $Self Care/Home Management: 8-22                     Ragina Fenter W,PT Acute Rehabilitation Services Pager:  [phone]  Office:  [place] 09/07/2020, 3:54 PM

## 2020-09-07 NOTE — Progress Notes (Signed)
[place] for Heparin Indication: DVT and r/o PE  Allergies  Allergen Reactions   Keflex [Cephalexin] Shortness Of Breath and Other (See Comments)    dizziness    Patient Measurements: Height: 5\' 10"  (177.8 cm) Weight: 85.5 kg (188 lb 7.9 oz) IBW/kg (Calculated) : 73 Heparin Dosing Weight: 85 kg  Vital Signs: Temp: 98.4 F (36.9 C) (09/20 0703) Temp Source: Oral (09/20 0703) BP: 131/72 (09/20 0703) Pulse Rate: 74 (09/20 0835)  Labs: Recent Labs    09/04/20 1028 09/04/20 1028 09/05/20 0635 09/05/20 0635 09/06/20 1805 09/07/20 0811  HGB 12.9*   < > 12.3*   < > 12.4* 11.7*  HCT 42.0   < > 39.0  --  40.0 37.5*  PLT 280   < > 229  --  255 226  LABPROT 50.3*   < > 36.0*  --  27.9* 22.0*  INR 5.8*   < > 3.8*  --  2.7* 2.0*  HEPARINUNFRC 0.58   < > 0.49  --  0.36 0.10*  CREATININE 1.42*  --  1.15  --  1.07  --    < > = values in this interval not displayed.    Estimated Creatinine Clearance: 64.4 mL/min (by C-G formula based on SCr of 1.07 mg/dL).  Assessment: 74 yr old male presented with SOB, cough, found to be covid+ (pt is fully vaccinated).  Patient is on warfarin 3 mg daily PTA. INR day prior to admit as outpatient was 2.5. Admission INR was 2.5 >> 3.6 on hospital day 2, then was above 4 ever since (last warfarin dose was 9/8).  Pharmacy discussed with Dr Aileen Fass the fact patient has now had acute DVT while with a supratherapeutic INR. He was concerned this was acute phase reactant associated with COVID (since all other markers going up). No change in LFTs. Could also just be warfarin failure in setting of COVID associated thrombosis.  Heparin level is subtherapeutic at 0.1, on 900 units/hr. INR today is 2 - has been off warfarin. Hgb 11.7, plt 226. Level drawn from opposite hand - no s/sx of bleeding or infusion issues per nursing.    Goal of Therapy:  Heparin level 0.3-0.7 units/ml Monitor platelets by anticoagulation  protocol: Yes   Plan:  Increase heparin gtt to 1100 units/hr Order heparin level in 6 hr  Daily heparin level, CBC, s/s bleeding F/u long term [facility] plan   Antonietta Jewel, PharmD, [place] Clinical Pharmacist  Phone: [phone] 09/07/2020 9:24 AM  Please check AMION for all [place] phone numbers After 10:00 PM, call [place] [phone]

## 2020-09-07 NOTE — Progress Notes (Signed)
[place] for Heparin Indication: DVT and r/o PE  Allergies  Allergen Reactions  . Keflex [Cephalexin] Shortness Of Breath and Other (See Comments)    dizziness    Patient Measurements: Height: 5\' 10"  (177.8 cm) Weight: 85.5 kg (188 lb 7.9 oz) IBW/kg (Calculated) : 73 Heparin Dosing Weight: 85 kg  Vital Signs: Temp: 97.6 F (36.4 C) (09/20 1210) Temp Source: Oral (09/20 1210) BP: 116/70 (09/20 1210) Pulse Rate: 70 (09/20 1210)  Labs: Recent Labs    09/05/20 0635 09/05/20 0635 09/06/20 1805 09/07/20 0811 09/07/20 1520  HGB 12.3*   < > 12.4* 11.7*  --   HCT 39.0  --  40.0 37.5*  --   PLT 229  --  255 226  --   LABPROT 36.0*  --  27.9* 22.0*  --   INR 3.8*  --  2.7* 2.0*  --   HEPARINUNFRC 0.49   < > 0.36 0.10* 0.52  CREATININE 1.15  --  1.07  --   --    < > = values in this interval not displayed.    Estimated Creatinine Clearance: 64.4 mL/min (by C-G formula based on SCr of 1.07 mg/dL).  Assessment: 74 yr old male presented with SOB, cough, found to be COVID positive (pt is fully vaccinated).  Patient was on warfarin 3 mg daily PTA. INR day prior to admit as outpatient was 2.5. Admission INR was 2.5 >> 3.6 on hospital day 2, then was above 4 until 9/18; down to 2.0 today (last warfarin dose was 9/8).  Pharmacy discussed with Dr Aileen Fass the fact patient has now had acute DVT while with a supratherapeutic INR. He was concerned this was acute phase reactant associated with COVID (since all other markers going up). No change in LFTs. Could also just be warfarin failure in setting of COVID associated thrombosis.  Heparin level ~6 hrs after heparin infusion was increased to 1100 units/hr was 0.52 units/ml, which is within the goal range for this pt. H/H 11.7/37.5, platelets 226. Per RN, no issues with IV or bleeding (does have a small amt of bleeding from hemorrhoids, unchanged).  Goal of Therapy:  Heparin level 0.3-0.7  units/ml Monitor platelets by anticoagulation protocol: Yes   Plan:  Continue heparin infusion at 1100 units/hr Check confirmatory heparin level in 6 hrs Monitor daily heparin level, CBC Monitor for signs/symptoms of bleeding F/U long term [facility] plan   Gillermina Hu, PharmD, BCPS, [facility] Clinical Pharmacist 09/07/2020 4:18 PM

## 2020-09-07 NOTE — Progress Notes (Signed)
PROGRESS NOTE  [name] ZOX:[id number] DOB: [birth year] DOA: 08/25/2020 PCP: [name], MD   LOS: 12 days   Brief Narrative / Interim history: [name] is an 74 y.o. male past medical history significant for nonischemic cardiomyopathy with an EF of 25% status post AICD, permanent atrial fibrillation/atrial flutter on Coumadin who is fully vaccinated in March went [place] an [place]state [place] several days prior to admission started developing shortness of breath at rest with cough nasal congestion loose stools and generalized weakness.  In the ED was found tachypneic hypoxic placed on 2 L of oxygen SARS-CoV-2 PCR was positive tropes 25 D-dimer 1.6 BNP of 425 creatinine at baseline chest x-ray showed bilateral diffuse groundglass opacities asymmetric.  Subjective / 24h Interval events: Feeling better, has difficulties with breathing when he moves from the bed to the chair or otherwise at rest he feels well.  No chest pain, no abdominal pain, no nausea or vomiting  Assessment & Plan:  Principal Problem Acute Hypoxic Respiratory Failure due to Covid-19 Viral Illness -Remains on heated high flow, slightly improved with 20 L and 40 % FiO2 FiO2 (%):  [40 %-100 %] 40 %  -Inflammatory markers appears to be improving -Patient has refused Actemra and baricitinib but he completed remdesivir -Continue steroids, will decrease the dose today given leukocytosis  COVID-19 Labs  Recent Labs    09/05/20 0635 09/06/20 1805  DDIMER 4.13*  --   CRP 1.9* 0.7    Lab Results  Component Value Date   SARSCOV2NAA POSITIVE (A) 08/26/2020   [place] NEGATIVE 04/21/2020    Active Problems Acute bilateral lower extremity DVT -this was despite being on Coumadin.  Currently on heparin infusion, no bleeding, continue.  When closer to discharge will need to transition to [place]. His INR remains elevated  Leukocytosis-possibly due to steroids however there is a significant jump to this 25K.   He is afebrile and clinically appears to be improving.  I will decrease steroid dose today and continue to monitor.  Low threshold to culture/antibiotics if he spikes a temperature  Acute on chronic systolic CHF-he remains net negative about 7 L.  Given the stability of his creatinine resume Lasix today  Permanent A. fib-on heparin, metoprolol  Chronic kidney disease stage IIIa-fluctuates daily but overall at baseline, resumed Lasix today, continue to closely monitor  CAD status post PCI-continue statin, metoprolol, losartan, aspirin  Hyperlipidemia-continue statins  Small amount of black stool-he does have thrombosed external hemorrhoids on colonoscopy in 2017.  Scant blood noted by the nurse.  Will order suppository for hemorrhoids  In-hospital delirium-resolved   Oral thrush - continue IV Diflucan for 7 days  Scheduled Meds: . albuterol  2 puff Inhalation TID  . vitamin C  500 mg Oral Daily  . digoxin  0.125 mg Oral Daily  . fenofibrate  160 mg Oral Daily  . ferrous sulfate  325 mg Oral BID  . folic acid  1 mg Oral QPM  . furosemide  40 mg Oral Daily  . hydrocortisone  25 mg Rectal BID  . methylPREDNISolone (SOLU-MEDROL) injection  40 mg Intravenous Q12H  . metoprolol succinate  25 mg Oral Daily  . oxymetazoline  1 spray Each Nare BID  . pantoprazole  40 mg Oral BID  . pravastatin  40 mg Oral Daily  . saline  1 application Each Nare W1X  . sertraline  150 mg Oral Q1400  . sodium chloride  1 spray Each Nare Q6H  . zinc sulfate  220 mg Oral Daily   Continuous Infusions: . sodium chloride    . fluconazole (DIFLUCAN) IV 100 mg (09/06/20 1433)  . heparin 1,100 Units/hr (09/07/20 0939)   PRN Meds:.sodium chloride, acetaminophen, antiseptic oral rinse, chlorpheniramine-HYDROcodone, haloperidol lactate, nitroGLYCERIN, ondansetron **OR** ondansetron (ZOFRAN) IV, traMADol-acetaminophen, zolpidem  DVT prophylaxis: heparin  Code Status: Full code Family Communication: called wife  Santiago Glad [phone] 9/18, 9/19  Status is: Inpatient  Remains inpatient appropriate because:Inpatient level of care appropriate due to severity of illness  Dispo: The patient is from: Home              Anticipated d/c is to: Home              Anticipated d/c date is: > 3 days              Patient currently is not medically stable to d/c.  Consultants:  None   Procedures:  None   Microbiology: None   Antibacterials: None    Objective: Vitals:   09/07/20 0345 09/07/20 0346 09/07/20 0703 09/07/20 0835  BP:   131/72   Pulse: 70  70 74  Resp: 19  18 (!) 22  Temp:   98.4 F (36.9 C)   TempSrc:   Oral   SpO2: 100% 99% 93% 93%  Weight:      Height:        Intake/Output Summary (Last 24 hours) at 09/07/2020 1057 Last data filed at 09/07/2020 0700 Gross per 24 hour  Intake 516.1 ml  Output 650 ml  Net -133.9 ml   Filed Weights   09/04/20 0545 09/05/20 0331 09/06/20 0447  Weight: 83.3 kg 86.2 kg 85.5 kg    Examination: Constitutional: No distress Eyes: No scleral icterus ENMT: Moist mucous membranes Neck: normal, supple Respiratory: Diffuse bibasilar rhonchi, no wheezing, no crackles Cardiovascular: Regular rate and rhythm, no murmurs, trace edema Abdomen: Soft, nontender, nondistended, positive bowel sounds Musculoskeletal: no clubbing / cyanosis.  Skin: No rashes seen Neurologic: Nonfocal, equal strength  Data Reviewed: I have independently reviewed following labs and imaging studies   CBC: Recent Labs  Lab 08/31/20 1131 09/02/20 0030 09/03/20 0249 09/04/20 1028 09/05/20 0635 09/06/20 1805 09/07/20 0811  WBC 17.3*   < > 16.9* 21.6* 19.1* 19.7* 25.0*  NEUTROABS 15.7*  --   --   --   --   --   --   HGB 12.6*   < > 11.8* 12.9* 12.3* 12.4* 11.7*  HCT 39.4   < > 37.9* 42.0 39.0 40.0 37.5*  MCV 80.7   < > 80.3 81.4 81.8 81.5 81.0  PLT 339   < > 254 280 229 255 226   < > = values in this interval not displayed.   Basic Metabolic Panel: Recent Labs  Lab  08/31/20 1131 08/31/20 1131 09/02/20 0857 09/03/20 0249 09/04/20 1028 09/05/20 0635 09/06/20 1805  NA 142   < > 138 140 137 138 135  K 3.5   < > 3.8 3.8 3.7 4.0 4.2  CL 99   < > 98 100 99 99 100  CO2 30   < > 27 27 26 30 26   GLUCOSE 121*   < > 87 147* 137* 96 136*  BUN 46*   < > 41* 37* 36* 37* 31*  CREATININE 1.42*   < > 1.17 1.19 1.42* 1.15 1.07  CALCIUM 8.5*   < > 8.4* 8.5* 8.8* 8.6* 8.5*  MG 2.2  --   --   --   --   --   --  PHOS 2.9  --   --   --   --   --   --    < > = values in this interval not displayed.   GFR: Estimated Creatinine Clearance: 64.4 mL/min (by C-G formula based on SCr of 1.07 mg/dL). Liver Function Tests: Recent Labs  Lab 08/31/20 1131 09/03/20 0800  AST 24 24  ALT 14 11  ALKPHOS 55 57  BILITOT 0.7 0.9  PROT 6.0* 5.9*  ALBUMIN 2.3* 2.2*   No results for input(s): LIPASE, AMYLASE in the last 168 hours. No results for input(s): AMMONIA in the last 168 hours. Coagulation Profile: Recent Labs  Lab 09/03/20 0249 09/04/20 1028 09/05/20 0635 09/06/20 1805 09/07/20 0811  INR 5.5* 5.8* 3.8* 2.7* 2.0*   Cardiac Enzymes: No results for input(s): CKTOTAL, CKMB, CKMBINDEX, TROPONINI in the last 168 hours. BNP (last 3 results) No results for input(s): PROBNP in the last 8760 hours. HbA1C: No results for input(s): HGBA1C in the last 72 hours. CBG: Recent Labs  Lab 09/02/20 1112  GLUCAP 99   Lipid Profile: No results for input(s): CHOL, HDL, LDLCALC, TRIG, CHOLHDL, LDLDIRECT in the last 72 hours. Thyroid Function Tests: No results for input(s): TSH, T4TOTAL, FREET4, T3FREE, THYROIDAB in the last 72 hours. Anemia Panel: No results for input(s): VITAMINB12, FOLATE, FERRITIN, TIBC, IRON, RETICCTPCT in the last 72 hours. Urine analysis:    Component Value Date/Time   COLORURINE AMBER (A) 09/29/2017 2310   APPEARANCEUR HAZY (A) 09/29/2017 2310   LABSPEC 1.013 09/29/2017 2310   PHURINE 5.0 09/29/2017 2310   GLUCOSEU NEGATIVE 09/29/2017 2310    HGBUR NEGATIVE 09/29/2017 [place] 09/29/2017 2310   KETONESUR NEGATIVE 09/29/2017 2310   PROTEINUR NEGATIVE 09/29/2017 2310   NITRITE NEGATIVE 09/29/2017 2310   LEUKOCYTESUR NEGATIVE 09/29/2017 2310   Sepsis Labs: Invalid input(s): PROCALCITONIN, LACTICIDVEN  No results found for this or any previous visit (from the past 240 hour(s)).    Radiology Studies: No results found.  Marzetta Board, MD, PhD [facility]  Between 7 am - 7 pm I am available, please contact me via Amion or Securechat  Between 7 pm - 7 am I am not available, please contact night coverage MD/APP via Amion

## 2020-09-08 ENCOUNTER — Inpatient Hospital Stay (HOSPITAL_COMMUNITY): Payer: Medicare Other

## 2020-09-08 LAB — BASIC METABOLIC PANEL
Anion gap: 9 (ref 5–15)
BUN: 29 mg/dL — ABNORMAL HIGH (ref 8–23)
CO2: 27 mmol/L (ref 22–32)
Calcium: 8.8 mg/dL — ABNORMAL LOW (ref 8.9–10.3)
Chloride: 101 mmol/L (ref 98–111)
Creatinine, Ser: 1.1 mg/dL (ref 0.61–1.24)
GFR calc Af Amer: >60 mL/min (ref >60)
GFR calc non Af Amer: >60 mL/min (ref >60)
Glucose, Bld: 94 mg/dL (ref 70–99)
Potassium: 3.7 mmol/L (ref 3.5–5.1)
Sodium: 137 mmol/L (ref 135–145)

## 2020-09-08 LAB — CBC
HCT: 38.1 % — ABNORMAL LOW (ref 39.0–52.0)
Hemoglobin: 12 g/dL — ABNORMAL LOW (ref 13.0–17.0)
MCH: 25.5 pg — ABNORMAL LOW (ref 26.0–34.0)
MCHC: 31.5 g/dL (ref 30.0–36.0)
MCV: 81.1 fL (ref 80.0–100.0)
Platelets: 215 10*3/uL (ref 150–400)
RBC: 4.7 MIL/uL (ref 4.22–5.81)
RDW: 16 % — ABNORMAL HIGH (ref 11.5–15.5)
WBC: 21.9 10*3/uL — ABNORMAL HIGH (ref 4.0–10.5)
nRBC: 0 % (ref 0.0–0.2)

## 2020-09-08 LAB — PROTIME-INR
INR: 1.9 — ABNORMAL HIGH (ref 0.8–1.2)
Prothrombin Time: 21 seconds — ABNORMAL HIGH (ref 11.4–15.2)

## 2020-09-08 LAB — HEPARIN LEVEL (UNFRACTIONATED): Heparin Unfractionated: 0.57 IU/mL (ref 0.30–0.70)

## 2020-09-08 MED ORDER — SODIUM CHLORIDE 0.9 % IV SOLN
2.0000 g | Freq: Three times a day (TID) | INTRAVENOUS | Status: AC
  Administered 2020-09-08 – 2020-09-13 (×15): 2 g via INTRAVENOUS
  Filled 2020-09-08 (×15): qty 2

## 2020-09-08 NOTE — Progress Notes (Signed)
Pharmacy Antibiotic Note  [name] is a 74 y.o. male admitted on 08/25/2020 with pneumonia.  Pharmacy has been consulted for Cefepime dosing.  ID: COVID PNA. WBC elevated (steroids), afebrile - Fully vaccinated with Pfizer in May per H&P - Pt refusing Actemra/Baricitinib - WBC 21.9 elevated, Afebrile, remains on Solumedrol. Start Cefepime for HCAP today with worsened O2 requirements and CXR  Thrush > diflucan x 7d through 9/21 Remdesivir 9/8>>9/12 Cefepime 9/21>>  9/8: BC x 2: Neg 9/8: COVID +  Plan: Cefepime 2g IV q8hr    Height: 5\' 10"  (177.8 cm) Weight: 85 kg (187 lb 6.3 oz) IBW/kg (Calculated) : 73  Temp (24hrs), Avg:97.8 F (36.6 C), Min:97.5 F (36.4 C), Max:98.3 F (36.8 C)  Recent Labs  Lab 09/03/20 0249 09/03/20 0249 09/04/20 1028 09/05/20 0635 09/06/20 1805 09/07/20 0811 09/08/20 0051  WBC 16.9*   < > 21.6* 19.1* 19.7* 25.0* 21.9*  CREATININE 1.19  --  1.42* 1.15 1.07  --  1.10   < > = values in this interval not displayed.    Estimated Creatinine Clearance: 62.7 mL/min (by C-G formula based on SCr of 1.1 mg/dL).    Allergies  Allergen Reactions  . Keflex [Cephalexin] Other (See Comments)    dizziness    [name] S. Alford Highland, PharmD, BCPS Clinical Staff Pharmacist [email or web address]  Wayland Salinas 09/08/2020 11:36 AM

## 2020-09-08 NOTE — Progress Notes (Signed)
PROGRESS NOTE  [name] SNK:[id number] DOB: 12/27/1945 DOA: 08/25/2020 PCP: [name], MD   LOS: 13 days   Brief Narrative / Interim history: [name] is an 74 y.o. male past medical history significant for nonischemic cardiomyopathy with an EF of 25% status post AICD, permanent atrial fibrillation/atrial flutter on Coumadin who is fully vaccinated in March went to an out-of[place]state [place] several days prior to admission started developing shortness of breath at rest with cough nasal congestion loose stools and generalized weakness.  In the ED was found tachypneic hypoxic placed on 2 L of oxygen SARS-CoV-2 PCR was positive tropes 25 D-dimer 1.6 BNP of 425 creatinine at baseline chest x-ray showed bilateral diffuse groundglass opacities asymmetric.  Subjective / 24h Interval events: Reports issues with a heated high flow and had water coming into the nasal cannula overnight last night.  Otherwise he is feeling about the same.  His nose is very dry and irritated  Assessment & Plan:  Principal Problem Acute Hypoxic Respiratory Failure due to Covid-19 Viral Illness -Remains on heated high flow, worsening today, on 9/20 was on 40% FiO2 but this morning on 9/21 he is on 90% FiO2 FiO2 (%):  [40 %-100 %] 90 %  -Repeat chest x-ray today slightly worse on the left with features concerning for consolidation, given leukocytosis cannot fully exclude bacterial superinfection and will start cefepime on 9/21 -Patient has refused Actemra and baricitinib but he completed remdesivir -Continue steroids, now on daily dosing.  COVID-19 Labs  Recent Labs    09/06/20 1805  CRP 0.7    Lab Results  Component Value Date   SARSCOV2NAA POSITIVE (A) 08/26/2020   [place] NEGATIVE 04/21/2020    Active Problems Acute bilateral lower extremity DVT -this was despite being on Coumadin.  Currently on heparin infusion, no bleeding, continue.  When closer to discharge will need to transition to  [place].  Leukocytosis, concern for HCAP-possibly due to steroids also however he had a significant jump to 25,000 on 9/20 and is 21,000 on 9/21.  Repeat chest x-ray on 9/21 showed worsening left airspace disease which looks slightly more consolidative.  Start cefepime  Acute on chronic systolic CHF-continue Lasix  Permanent A. fib-on heparin, metoprolol  Chronic kidney disease stage IIIa-fluctuates daily but overall at baseline, resumed Lasix today, continue to closely monitor  CAD status post PCI-continue statin, metoprolol, losartan, aspirin  Hyperlipidemia-continue statins  Small amount of black stool-he does have thrombosed external hemorrhoids on colonoscopy in 2017.  Scant blood noted by the nurse.  Will order suppository for hemorrhoids  In-hospital delirium-resolved   Oral thrush - continue IV Diflucan for 7 days, today's last day  Scheduled Meds: . albuterol  2 puff Inhalation TID  . vitamin C  500 mg Oral Daily  . digoxin  0.125 mg Oral Daily  . fenofibrate  160 mg Oral Daily  . ferrous sulfate  325 mg Oral BID  . folic acid  1 mg Oral QPM  . furosemide  40 mg Oral Daily  . hydrocortisone  25 mg Rectal BID  . methylPREDNISolone (SOLU-MEDROL) injection  40 mg Intravenous Daily  . metoprolol succinate  25 mg Oral Daily  . oxymetazoline  1 spray Each Nare BID  . pantoprazole  40 mg Oral BID  . pravastatin  40 mg Oral Daily  . saline  1 application Each Nare P3X  . sertraline  150 mg Oral Q1400  . sodium chloride  1 spray Each Nare Q6H  . zinc  sulfate  220 mg Oral Daily   Continuous Infusions: . sodium chloride    . heparin 1,100 Units/hr (09/07/20 2247)   PRN Meds:.sodium chloride, acetaminophen, antiseptic oral rinse, chlorpheniramine-HYDROcodone, haloperidol lactate, nitroGLYCERIN, ondansetron **OR** ondansetron (ZOFRAN) IV, traMADol-acetaminophen, zolpidem  DVT prophylaxis: heparin  Code Status: Full code Family Communication: called wife Santiago Glad [phone]    Status is: Inpatient  Remains inpatient appropriate because:Inpatient level of care appropriate due to severity of illness  Dispo: The patient is from: Home              Anticipated d/c is to: Home              Anticipated d/c date is: > 3 days              Patient currently is not medically stable to d/c.  Consultants:  None   Procedures:  None   Microbiology: None   Antibacterials: None    Objective: Vitals:   09/08/20 0128 09/08/20 0516 09/08/20 0811 09/08/20 0913  BP:  120/67 112/62   Pulse: 69 70 71 74  Resp: 18 20 18  (!) 25  Temp:  97.6 F (36.4 C) (!) 97.5 F (36.4 C)   TempSrc:  Oral Oral   SpO2: 95%  97% 94%  Weight:  85 kg    Height:        Intake/Output Summary (Last 24 hours) at 09/08/2020 1120 Last data filed at 09/08/2020 0851 Gross per 24 hour  Intake 1054 ml  Output 2400 ml  Net -1346 ml   Filed Weights   09/05/20 0331 09/06/20 0447 09/08/20 0516  Weight: 86.2 kg 85.5 kg 85 kg    Examination: Constitutional: In bed, no distress Eyes: No scleral icterus ENMT: Moist mucous membranes Neck: normal, supple Respiratory: Bibasilar rhonchi, no wheezing, no crackles Cardiovascular: Regular rate and rhythm, no murmurs, trace edema Abdomen: Soft, NT, ND, positive bowel sounds Musculoskeletal: no clubbing / cyanosis.  Skin: No rashes appreciated Neurologic: No focal deficits, equal strength  Data Reviewed: I have independently reviewed following labs and imaging studies   CBC: Recent Labs  Lab 09/04/20 1028 09/05/20 0635 09/06/20 1805 09/07/20 0811 09/08/20 0051  WBC 21.6* 19.1* 19.7* 25.0* 21.9*  HGB 12.9* 12.3* 12.4* 11.7* 12.0*  HCT 42.0 39.0 40.0 37.5* 38.1*  MCV 81.4 81.8 81.5 81.0 81.1  PLT 280 229 255 226 778   Basic Metabolic Panel: Recent Labs  Lab 09/03/20 0249 09/04/20 1028 09/05/20 0635 09/06/20 1805 09/08/20 0051  NA 140 137 138 135 137  K 3.8 3.7 4.0 4.2 3.7  CL 100 99 99 100 101  CO2 27 26 30 26 27   GLUCOSE  147* 137* 96 136* 94  BUN 37* 36* 37* 31* 29*  CREATININE 1.19 1.42* 1.15 1.07 1.10  CALCIUM 8.5* 8.8* 8.6* 8.5* 8.8*   GFR: Estimated Creatinine Clearance: 62.7 mL/min (by C-G formula based on SCr of 1.1 mg/dL). Liver Function Tests: Recent Labs  Lab 09/03/20 0800  AST 24  ALT 11  ALKPHOS 57  BILITOT 0.9  PROT 5.9*  ALBUMIN 2.2*   No results for input(s): LIPASE, AMYLASE in the last 168 hours. No results for input(s): AMMONIA in the last 168 hours. Coagulation Profile: Recent Labs  Lab 09/04/20 1028 09/05/20 0635 09/06/20 1805 09/07/20 0811 09/08/20 0051  INR 5.8* 3.8* 2.7* 2.0* 1.9*   Cardiac Enzymes: No results for input(s): CKTOTAL, CKMB, CKMBINDEX, TROPONINI in the last 168 hours. BNP (last 3 results) No results for input(s): PROBNP  in the last 8760 hours. HbA1C: No results for input(s): HGBA1C in the last 72 hours. CBG: Recent Labs  Lab 09/02/20 1112  GLUCAP 99   Lipid Profile: No results for input(s): CHOL, HDL, LDLCALC, TRIG, CHOLHDL, LDLDIRECT in the last 72 hours. Thyroid Function Tests: No results for input(s): TSH, T4TOTAL, FREET4, T3FREE, THYROIDAB in the last 72 hours. Anemia Panel: No results for input(s): VITAMINB12, FOLATE, FERRITIN, TIBC, IRON, RETICCTPCT in the last 72 hours. Urine analysis:    Component Value Date/Time   COLORURINE AMBER (A) 09/29/2017 2310   APPEARANCEUR HAZY (A) 09/29/2017 2310   LABSPEC 1.013 09/29/2017 2310   PHURINE 5.0 09/29/2017 2310   GLUCOSEU NEGATIVE 09/29/2017 2310   HGBUR NEGATIVE 09/29/2017 [place] 09/29/2017 2310   KETONESUR NEGATIVE 09/29/2017 2310   PROTEINUR NEGATIVE 09/29/2017 2310   NITRITE NEGATIVE 09/29/2017 2310   LEUKOCYTESUR NEGATIVE 09/29/2017 2310   Sepsis Labs: Invalid input(s): PROCALCITONIN, LACTICIDVEN  No results found for this or any previous visit (from the past 240 hour(s)).    Radiology Studies: DG CHEST PORT 1 VIEW  Result Date: 09/08/2020 CLINICAL DATA:   Shortness of breath, COVID-19 positive EXAM: PORTABLE CHEST 1 VIEW COMPARISON:  08/31/2020, 09/01/2020 FINDINGS: Right-sided implanted cardiac device, unchanged. Cardiomegaly. Aortic atherosclerosis. Diffuse airspace consolidation throughout the left lung has become slightly more consolidative compared to the prior x-ray. Progressive predominantly interstitial opacities throughout the right lung. No large pleural fluid collection. No pneumothorax identified. IMPRESSION: Slight interval worsening of diffuse bilateral airspace opacities, left greater than right. Electronically Signed   By: Davina Poke D.O.   On: 09/08/2020 10:07    Marzetta Board, MD, PhD [facility]  Between 7 am - 7 pm I am available, please contact me via Amion or Securechat  Between 7 pm - 7 am I am not available, please contact night coverage MD/APP via Amion

## 2020-09-08 NOTE — Progress Notes (Signed)
ANTICOAGULATION CONSULT NOTE - Follow Up Consult  Pharmacy Consult for Heparin Indication: afib and DVT  Allergies  Allergen Reactions  . Keflex [Cephalexin] Shortness Of Breath and Other (See Comments)    dizziness    Patient Measurements: Height: 5\' 10"  (177.8 cm) Weight: 85 kg (187 lb 6.3 oz) IBW/kg (Calculated) : 73 Heparin Dosing Weight:    Vital Signs: Temp: 97.6 F (36.4 C) (09/21 0516) Temp Source: Oral (09/21 0516) BP: 120/67 (09/21 0516) Pulse Rate: 70 (09/21 0516)  Labs: Recent Labs    09/06/20 1805 09/06/20 1805 09/07/20 0811 09/07/20 1520 09/08/20 0051  HGB 12.4*   < > 11.7*  --  12.0*  HCT 40.0  --  37.5*  --  38.1*  PLT 255  --  226  --  215  LABPROT 27.9*  --  22.0*  --  21.0*  INR 2.7*  --  2.0*  --  1.9*  HEPARINUNFRC 0.36   < > 0.10* 0.52 0.57  CREATININE 1.07  --   --   --  1.10   < > = values in this interval not displayed.    Estimated Creatinine Clearance: 62.7 mL/min (by C-G formula based on SCr of 1.1 mg/dL).   Assessment: Anticoag: Warfarin PTA for hx Afib. BL LE DVT INR not believed to be true d/t covid - continue IV heparin for now - 9/20PM: does have small amt of bleeding from hemorrhoids (unchanged); - Hep level 0.57, INR 1.9. Hgb 12 stable. Plts 215 stable. Ddimer 4.13 (last 9/18) - PTA dosing: 3mg  daily - PTA dose 9/6, Admit INR 2.6  Goal of Therapy:  Heparin level 0.3-0.7 units/ml Monitor platelets by anticoagulation protocol: Yes   Plan:  Con't IV heparin 1100 units/hr Daily HL and CBC Switch to DOAC closer to [place]. Alford Highland, PharmD, BCPS Clinical Staff Pharmacist [email or web address] Alford Highland, Keena Dinse Stillinger 09/08/2020,7:44 AM

## 2020-09-08 NOTE — Progress Notes (Signed)
ANTICOAGULATION CONSULT NOTE - Follow Up Consult  Pharmacy Consult for heparin Indication: DVT  Labs: Recent Labs    09/05/20 0635 09/05/20 0635 09/06/20 1805 09/06/20 1805 09/07/20 0811 09/07/20 1520 09/08/20 0051  HGB 12.3*   < > 12.4*   < > 11.7*  --  12.0*  HCT 39.0   < > 40.0  --  37.5*  --  38.1*  PLT 229   < > 255  --  226  --  215  LABPROT 36.0*  --  27.9*  --  22.0*  --   --   INR 3.8*  --  2.7*  --  2.0*  --   --   HEPARINUNFRC 0.49   < > 0.36   < > 0.10* 0.52 0.57  CREATININE 1.15  --  1.07  --   --   --   --    < > = values in this interval not displayed.    Assessment/Plan:  74yo male remains therapeutic on heparin. Will continue gtt at current rate and monitor daily level.   Wynona Neat, PharmD, BCPS  09/08/2020,1:40 AM

## 2020-09-09 LAB — BASIC METABOLIC PANEL
Anion gap: 11 (ref 5–15)
BUN: 33 mg/dL — ABNORMAL HIGH (ref 8–23)
CO2: 26 mmol/L (ref 22–32)
Calcium: 8.6 mg/dL — ABNORMAL LOW (ref 8.9–10.3)
Chloride: 97 mmol/L — ABNORMAL LOW (ref 98–111)
Creatinine, Ser: 1.06 mg/dL (ref 0.61–1.24)
GFR calc Af Amer: >60 mL/min (ref >60)
GFR calc non Af Amer: >60 mL/min (ref >60)
Glucose, Bld: 90 mg/dL (ref 70–99)
Potassium: 3.6 mmol/L (ref 3.5–5.1)
Sodium: 134 mmol/L — ABNORMAL LOW (ref 135–145)

## 2020-09-09 LAB — CBC
HCT: 40.1 % (ref 39.0–52.0)
Hemoglobin: 12.6 g/dL — ABNORMAL LOW (ref 13.0–17.0)
MCH: 25.3 pg — ABNORMAL LOW (ref 26.0–34.0)
MCHC: 31.4 g/dL (ref 30.0–36.0)
MCV: 80.5 fL (ref 80.0–100.0)
Platelets: 209 10*3/uL (ref 150–400)
RBC: 4.98 MIL/uL (ref 4.22–5.81)
RDW: 16.2 % — ABNORMAL HIGH (ref 11.5–15.5)
WBC: 18 10*3/uL — ABNORMAL HIGH (ref 4.0–10.5)
nRBC: 0 % (ref 0.0–0.2)

## 2020-09-09 LAB — PROTIME-INR
INR: 1.6 — ABNORMAL HIGH (ref 0.8–1.2)
Prothrombin Time: 18.7 seconds — ABNORMAL HIGH (ref 11.4–15.2)

## 2020-09-09 LAB — MAGNESIUM: Magnesium: 1.9 mg/dL (ref 1.7–2.4)

## 2020-09-09 LAB — HEPARIN LEVEL (UNFRACTIONATED): Heparin Unfractionated: 0.68 IU/mL (ref 0.30–0.70)

## 2020-09-09 LAB — MRSA PCR SCREENING: MRSA by PCR: NEGATIVE

## 2020-09-09 NOTE — Progress Notes (Signed)
   09/08/20 2023  Assess: MEWS Score  Pulse Rate 69  Resp (!) 22  SpO2 96 %  O2 Device HFNC  Heater temperature 89.6 F (32 C)  O2 Flow Rate (L/min) 30 L/min  FiO2 (%) 90 %  Assess: MEWS Score  MEWS Temp 0  MEWS Systolic 1  MEWS Pulse 0  MEWS RR 1  MEWS LOC 0  MEWS Score 2  MEWS Score Color Yellow  Assess: if the MEWS score is Yellow or Red  Were vital signs taken at a resting state? Yes  Focused Assessment No change from prior assessment  Early Detection of Sepsis Score *See Row Information* Low  MEWS guidelines implemented *See Row Information* Yes  Treat  MEWS Interventions Escalated (See documentation below)  Pain Scale 0-10  Pain Score 0  Patients response to intervention Effective  Take Vital Signs  Increase Vital Sign Frequency  Yellow: Q 2hr X 2 then Q 4hr X 2, if remains yellow, continue Q 4hrs  Escalate  MEWS: Escalate Yellow: discuss with charge nurse/RN and consider discussing with provider and RRT  Notify: Charge Nurse/RN  Name of Charge Nurse/RN Notified Danae Chen  Date Charge Nurse/RN Notified 09/09/20

## 2020-09-09 NOTE — Progress Notes (Signed)
PROGRESS NOTE  [name] KDX:[id number] DOB: 1946-11-16 DOA: 08/25/2020 PCP: [name], MD   LOS: 14 days   Brief Narrative / Interim history: [name] is an 74 y.o. male past medical history significant for nonischemic cardiomyopathy with an EF of 25% status post AICD, permanent atrial fibrillation/atrial flutter on Coumadin who is fully vaccinated in March went [place] several days prior to admission started developing shortness of breath at rest with cough nasal congestion loose stools and generalized weakness.  In the ED was found tachypneic hypoxic placed on 2 L of oxygen SARS-CoV-2 PCR was positive tropes 25 D-dimer 1.6 BNP of 425 creatinine at baseline chest x-ray showed bilateral diffuse groundglass opacities asymmetric.  Subjective / 24h Interval events: Complains that his nose is quite dry and irritated, denies any shortness of breath.  No abdominal pain.  No chest pain.  Assessment & Plan:  Principal Problem Acute Hypoxic Respiratory Failure due to Covid-19 Viral Illness -Remains on heated high flow, worsening today, on 9/20 was on 40% FiO2 but since 9/21 his oxygen requirements have gone back up a little bit.  He is on 80% FiO2 this morning FiO2 (%):  [80 %-90 %] 80 %  -Repeat chest x-ray today slightly worse on the left with features concerning for consolidation, given leukocytosis cannot fully exclude bacterial superinfection and has been started on cefepime on 9/21 -Patient has refused Actemra and baricitinib but he completed remdesivir -Continue steroids given high likelihood of developing fibrosis, now on daily dosing.  COVID-19 Labs  Recent Labs    09/06/20 1805  CRP 0.7    Lab Results  Component Value Date   SARSCOV2NAA POSITIVE (A) 08/26/2020   [place] NEGATIVE 04/21/2020    Active Problems Acute bilateral lower extremity DVT -this was despite being on Coumadin.  Currently on heparin infusion, no bleeding, continue.   When closer to discharge will need to transition to [place].  Leukocytosis, concern for HCAP-possibly due to steroids also however he had a significant jump to 25,000 on 9/20 and was 21,000 on 9/21.  Repeat chest x-ray on 9/21 showed worsening left airspace disease which looks slightly more consolidative.  Patient was started on cefepime, WBC improving today  Acute on chronic systolic CHF-continue Lasix  Permanent A. fib-on heparin, metoprolol  Chronic kidney disease stage IIIa-fluctuates daily but overall at baseline, resumed Lasix today, continue to closely monitor  CAD status post PCI-continue statin, metoprolol, losartan, aspirin  Hyperlipidemia-continue statins  Small amount of black stool-he does have thrombosed external hemorrhoids on colonoscopy in 2017.  Scant blood noted by the nurse.  Continue suppositories for hemorrhoids  In-hospital delirium-resolved   Oral thrush -status post 7 days of Diflucan  Scheduled Meds: . albuterol  2 puff Inhalation TID  . vitamin C  500 mg Oral Daily  . digoxin  0.125 mg Oral Daily  . fenofibrate  160 mg Oral Daily  . ferrous sulfate  325 mg Oral BID  . folic acid  1 mg Oral QPM  . furosemide  40 mg Oral Daily  . hydrocortisone  25 mg Rectal BID  . methylPREDNISolone (SOLU-MEDROL) injection  40 mg Intravenous Daily  . metoprolol succinate  25 mg Oral Daily  . oxymetazoline  1 spray Each Nare BID  . pantoprazole  40 mg Oral BID  . pravastatin  40 mg Oral Daily  . saline  1 application Each Nare Z7Q  . sertraline  150 mg Oral Q1400  . sodium chloride  1 spray Each Nare Q6H  . zinc sulfate  220 mg Oral Daily   Continuous Infusions: . sodium chloride    . ceFEPime (MAXIPIME) IV 2 g (09/09/20 0505)  . heparin 1,100 Units/hr (09/08/20 2006)   PRN Meds:.sodium chloride, acetaminophen, antiseptic oral rinse, chlorpheniramine-HYDROcodone, haloperidol lactate, nitroGLYCERIN, ondansetron **OR** ondansetron (ZOFRAN) IV, traMADol-acetaminophen,  zolpidem  DVT prophylaxis: heparin  Code Status: Full code Family Communication: called wife Santiago Glad [phone]   Status is: Inpatient  Remains inpatient appropriate because:Inpatient level of care appropriate due to severity of illness  Dispo: The patient is from: Home              Anticipated d/c is to: Home              Anticipated d/c date is: > 3 days              Patient currently is not medically stable to d/c.  Consultants:  None   Procedures:  None   Microbiology: None   Antibacterials: None    Objective: Vitals:   09/09/20 0456 09/09/20 0500 09/09/20 0718 09/09/20 0932  BP:  105/69  109/62  Pulse:  70 68 70  Resp:  (!) 21 20 20   Temp:  97.6 F (36.4 C)  97.7 F (36.5 C)  TempSrc:  Oral  Oral  SpO2:  93% 98%   Weight: 81.5 kg     Height:        Intake/Output Summary (Last 24 hours) at 09/09/2020 1120 Last data filed at 09/09/2020 0700 Gross per 24 hour  Intake 2294 ml  Output 1225 ml  Net 1069 ml   Filed Weights   09/06/20 0447 09/08/20 0516 09/09/20 0456  Weight: 85.5 kg 85 kg 81.5 kg    Examination: Constitutional: NAD, in bed Eyes: no icterus ENMT: mmm Neck: normal, supple Respiratory: bibasilar rhonchi, no wheezing  Cardiovascular: rrr, no mrg, trace edema Abdomen: soft, nt, nd, bs+ Musculoskeletal: no clubbing / cyanosis.  Skin: no rashes Neurologic: non focal  Data Reviewed: I have independently reviewed following labs and imaging studies   CBC: Recent Labs  Lab 09/05/20 0635 09/06/20 1805 09/07/20 0811 09/08/20 0051 09/09/20 0218  WBC 19.1* 19.7* 25.0* 21.9* 18.0*  HGB 12.3* 12.4* 11.7* 12.0* 12.6*  HCT 39.0 40.0 37.5* 38.1* 40.1  MCV 81.8 81.5 81.0 81.1 80.5  PLT 229 255 226 215 824   Basic Metabolic Panel: Recent Labs  Lab 09/04/20 1028 09/05/20 0635 09/06/20 1805 09/08/20 0051 09/09/20 0218  NA 137 138 135 137 134*  K 3.7 4.0 4.2 3.7 3.6  CL 99 99 100 101 97*  CO2 26 30 26 27 26   GLUCOSE 137* 96 136* 94 90    BUN 36* 37* 31* 29* 33*  CREATININE 1.42* 1.15 1.07 1.10 1.06  CALCIUM 8.8* 8.6* 8.5* 8.8* 8.6*  MG  --   --   --   --  1.9   GFR: Estimated Creatinine Clearance: 65 mL/min (by C-G formula based on SCr of 1.06 mg/dL). Liver Function Tests: Recent Labs  Lab 09/03/20 0800  AST 24  ALT 11  ALKPHOS 57  BILITOT 0.9  PROT 5.9*  ALBUMIN 2.2*   No results for input(s): LIPASE, AMYLASE in the last 168 hours. No results for input(s): AMMONIA in the last 168 hours. Coagulation Profile: Recent Labs  Lab 09/05/20 0635 09/06/20 1805 09/07/20 0811 09/08/20 0051 09/09/20 0218  INR 3.8* 2.7* 2.0* 1.9* 1.6*   Cardiac Enzymes: No results for input(s):  CKTOTAL, CKMB, CKMBINDEX, TROPONINI in the last 168 hours. BNP (last 3 results) No results for input(s): PROBNP in the last 8760 hours. HbA1C: No results for input(s): HGBA1C in the last 72 hours. CBG: No results for input(s): GLUCAP in the last 168 hours. Lipid Profile: No results for input(s): CHOL, HDL, LDLCALC, TRIG, CHOLHDL, LDLDIRECT in the last 72 hours. Thyroid Function Tests: No results for input(s): TSH, T4TOTAL, FREET4, T3FREE, THYROIDAB in the last 72 hours. Anemia Panel: No results for input(s): VITAMINB12, FOLATE, FERRITIN, TIBC, IRON, RETICCTPCT in the last 72 hours. Urine analysis:    Component Value Date/Time   COLORURINE AMBER (A) 09/29/2017 2310   APPEARANCEUR HAZY (A) 09/29/2017 2310   LABSPEC 1.013 09/29/2017 2310   PHURINE 5.0 09/29/2017 2310   GLUCOSEU NEGATIVE 09/29/2017 2310   HGBUR NEGATIVE 09/29/2017 [place] 09/29/2017 2310   KETONESUR NEGATIVE 09/29/2017 2310   PROTEINUR NEGATIVE 09/29/2017 2310   NITRITE NEGATIVE 09/29/2017 2310   LEUKOCYTESUR NEGATIVE 09/29/2017 2310   Sepsis Labs: Invalid input(s): PROCALCITONIN, LACTICIDVEN  Recent Results (from the past 240 hour(s))  MRSA PCR Screening     Status: None   Collection Time: 09/09/20  4:38 AM   Specimen: Nasal Mucosa;  Nasopharyngeal  Result Value Ref Range Status   MRSA by PCR NEGATIVE NEGATIVE Final    Comment:        The GeneXpert MRSA Assay (FDA approved for NASAL specimens only), is one component of a comprehensive MRSA colonization surveillance program. It is not intended to diagnose MRSA infection nor to guide or monitor treatment for MRSA infections. Performed at [facility], [place] [address], [place], [place] [address]       Radiology Studies: DG CHEST PORT 1 VIEW  Result Date: 09/08/2020 CLINICAL DATA:  Shortness of breath, COVID-19 positive EXAM: PORTABLE CHEST 1 VIEW COMPARISON:  08/31/2020, 09/01/2020 FINDINGS: Right-sided implanted cardiac device, unchanged. Cardiomegaly. Aortic atherosclerosis. Diffuse airspace consolidation throughout the left lung has become slightly more consolidative compared to the prior x-ray. Progressive predominantly interstitial opacities throughout the right lung. No large pleural fluid collection. No pneumothorax identified. IMPRESSION: Slight interval worsening of diffuse bilateral airspace opacities, left greater than right. Electronically Signed   By: Davina Poke D.O.   On: 09/08/2020 10:07    Marzetta Board, MD, PhD [facility]  Between 7 am - 7 pm I am available, please contact me via Amion or Securechat  Between 7 pm - 7 am I am not available, please contact night coverage MD/APP via Amion

## 2020-09-09 NOTE — Progress Notes (Addendum)
Physical Therapy Treatment Patient Details Name: [name] MRN: [id number] DOB: 1946-05-17 Today's Date: 09/09/2020    History of Present Illness [name] is a 74 y.o. male with medical history significant of nonischemic cardiomyopathy with ejection fraction of 25-30%, status post ICD placement, permanent A. fib/atrial flutter-on Coumadin, hypertension, hyperlipidemia, depression/anxiety, GERD, presents to emergency department with worsening shortness of breath and cough since 1 week. Tested pos for covid.     PT Comments    Pt admitted with above diagnosis. Pt was able to perform LE exercises in bed and kept sats 88% and above with rest breaks with pt at 90% HF oxygen therapy.  Pt reviewed incentive spirometer use as well.  Pt left in chair position.  0/5 goals met due to pt with slow progess due to deconditioning and Covid complications.  Revised goals.  Will continue to progress as pt tolerates. Pt currently with functional limitations due to balance and endurance deficits. Pt will benefit from skilled PT to increase their independence and safety with mobility to allow discharge to the venue listed below.     Follow Up Recommendations  SNF;Supervision/Assistance - 24 hour     Equipment Recommendations  None recommended by PT    Recommendations for Other Services       Precautions / Restrictions Precautions Precautions: Fall Restrictions Weight Bearing Restrictions: No    Mobility  Bed Mobility               General bed mobility comments: Bed level today due to significant decompensation last treatment.  Pt on 80% HF oxygen therapy this date. 95% O2 on arrival. Sats dropped to 89% after all the activity but 92% at end of treatment.    Transfers                    Ambulation/Gait                 Stairs             Wheelchair Mobility    Modified Rankin (Stroke Patients Only)       Balance                                             Cognition Arousal/Alertness: Awake/alert Behavior During Therapy: WFL for tasks assessed/performed Overall Cognitive Status: Within Functional Limits for tasks assessed                                        Exercises General Exercises - Lower Extremity Ankle Circles/Pumps: AROM;Both;10 reps;Supine Quad Sets: AROM;Both;10 reps;Supine Gluteal Sets: AROM;Both;10 reps;Supine Heel Slides: AROM;Both;10 reps;Supine Hip ABduction/ADduction: AROM;Both;10 reps;Supine Straight Leg Raises: AROM;Both;10 reps;Supine Other Exercises Other Exercises: Pt practiced incentive spirometer x 10  Other Exercises: Practiced 10 bridges and scooting up in bed.     General Comments General comments (skin integrity, edema, etc.): Pt left in chair position.        Pertinent Vitals/Pain Pain Assessment: No/denies pain    Home Living                      Prior Function            PT Goals (current goals can now be found in the care plan  section) Acute Rehab PT Goals Patient Stated Goal: get better PT Goal Formulation: With patient Time For Goal Achievement: 09/23/20 Potential to Achieve Goals: Good Progress towards PT goals: Goals downgraded-see care plan    Frequency    Min 3X/week      PT Plan Current plan remains appropriate    Co-evaluation              AM-PAC PT "6 Clicks" Mobility   Outcome Measure  Help needed turning from your back to your side while in a flat bed without using bedrails?: None Help needed moving from lying on your back to sitting on the side of a flat bed without using bedrails?: A Little Help needed moving to and from a bed to a chair (including a wheelchair)?: A Little Help needed standing up from a chair using your arms (e.g., wheelchair or bedside chair)?: A Little Help needed to walk in hospital room?: Total Help needed climbing 3-5 steps with a railing? : Total 6 Click Score: 15    End of Session  Equipment Utilized During Treatment: Oxygen Activity Tolerance: Patient limited by fatigue Patient left: with call bell/phone within reach;in bed;with bed alarm set Nurse Communication: Mobility status PT Visit Diagnosis: Unsteadiness on feet (R26.81);Muscle weakness (generalized) (M62.81);Dizziness and giddiness (R42)     Time: 1950-9326 PT Time Calculation (min) (ACUTE ONLY): 34 min  Charges:  $Therapeutic Exercise: 23-37 mins                     Cionna Collantes W,PT Acute Rehabilitation Services Pager:  [phone]  Office:  [place] 09/09/2020, 11:02 AM

## 2020-09-09 NOTE — Progress Notes (Signed)
ANTICOAGULATION CONSULT NOTE - Follow Up Consult  Pharmacy Consult for Heparin Indication: afib and DVT  Allergies  Allergen Reactions  . Keflex [Cephalexin] Other (See Comments)    dizziness    Patient Measurements: Height: 5\' 10"  (177.8 cm) Weight: 81.5 kg (179 lb 10.8 oz) IBW/kg (Calculated) : 73 Heparin Dosing Weight:    Vital Signs: Temp: 97.6 F (36.4 C) (09/22 0500) Temp Source: Oral (09/22 0500) BP: 105/69 (09/22 0500) Pulse Rate: 70 (09/22 0500)  Labs: Recent Labs    09/06/20 1805 09/06/20 1805 09/07/20 0811 09/07/20 0811 09/07/20 1520 09/08/20 0051 09/09/20 0218  HGB 12.4*   < > 11.7*   < >  --  12.0* 12.6*  HCT 40.0   < > 37.5*  --   --  38.1* 40.1  PLT 255   < > 226  --   --  215 209  LABPROT 27.9*   < > 22.0*  --   --  21.0* 18.7*  INR 2.7*   < > 2.0*  --   --  1.9* 1.6*  HEPARINUNFRC 0.36   < > 0.10*   < > 0.52 0.57 0.68  CREATININE 1.07  --   --   --   --  1.10 1.06   < > = values in this interval not displayed.    Estimated Creatinine Clearance: 65 mL/min (by C-G formula based on SCr of 1.06 mg/dL).   Assessment: Anticoag: Warfarin PTA for hx Afib. BL LE DVT. INR not believed to be true d/t covid - continue IV heparin for now. - 9/20PM: does have small amt of bleeding from hemorrhoids (unchanged); - Hep level 0.68, INR 1.6. Hgb 12.6 improved. Plts 209 stable. Ddimer 4.13 (last 9/18) - PTA dosing: 3mg  daily - PTA dose 9/6, Admit INR 2.6  Goal of Therapy:  Heparin level 0.3-0.7 units/ml Monitor platelets by anticoagulation protocol: Yes   Plan:  Con't IV heparin 1100 units/hr Daily HL and CBC Switch to DOAC closer to [place]. Alford Highland, PharmD, BCPS Clinical Staff Pharmacist [email or web address] Alford Highland, [employer] 09/09/2020,7:19 AM

## 2020-09-10 LAB — BASIC METABOLIC PANEL
Anion gap: 12 (ref 5–15)
BUN: 32 mg/dL — ABNORMAL HIGH (ref 8–23)
CO2: 24 mmol/L (ref 22–32)
Calcium: 8.9 mg/dL (ref 8.9–10.3)
Chloride: 100 mmol/L (ref 98–111)
Creatinine, Ser: 1.11 mg/dL (ref 0.61–1.24)
GFR calc Af Amer: >60 mL/min (ref >60)
GFR calc non Af Amer: >60 mL/min (ref >60)
Glucose, Bld: 120 mg/dL — ABNORMAL HIGH (ref 70–99)
Potassium: 4.1 mmol/L (ref 3.5–5.1)
Sodium: 136 mmol/L (ref 135–145)

## 2020-09-10 LAB — CBC
HCT: 38.9 % — ABNORMAL LOW (ref 39.0–52.0)
Hemoglobin: 12.3 g/dL — ABNORMAL LOW (ref 13.0–17.0)
MCH: 25.8 pg — ABNORMAL LOW (ref 26.0–34.0)
MCHC: 31.6 g/dL (ref 30.0–36.0)
MCV: 81.6 fL (ref 80.0–100.0)
Platelets: 190 10*3/uL (ref 150–400)
RBC: 4.77 MIL/uL (ref 4.22–5.81)
RDW: 16.1 % — ABNORMAL HIGH (ref 11.5–15.5)
WBC: 15.2 10*3/uL — ABNORMAL HIGH (ref 4.0–10.5)
nRBC: 0 % (ref 0.0–0.2)

## 2020-09-10 MED ORDER — APIXABAN 5 MG PO TABS
5.0000 mg | ORAL_TABLET | Freq: Two times a day (BID) | ORAL | Status: DC
  Administered 2020-09-17 – 2020-09-28 (×23): 5 mg via ORAL
  Filled 2020-09-10 (×23): qty 1

## 2020-09-10 MED ORDER — INFLUENZA VAC A&B SA ADJ QUAD 0.5 ML IM PRSY
0.5000 mL | PREFILLED_SYRINGE | INTRAMUSCULAR | Status: DC
  Filled 2020-09-10: qty 0.5

## 2020-09-10 MED ORDER — APIXABAN 5 MG PO TABS
10.0000 mg | ORAL_TABLET | Freq: Two times a day (BID) | ORAL | Status: AC
  Administered 2020-09-10 – 2020-09-16 (×14): 10 mg via ORAL
  Filled 2020-09-10 (×14): qty 2

## 2020-09-10 MED ORDER — PNEUMOCOCCAL VAC POLYVALENT 25 MCG/0.5ML IJ INJ: 0.5000 mL | INJECTION | INTRAMUSCULAR | Status: DC

## 2020-09-10 NOTE — Discharge Instructions (Signed)
Information on my medicine - ELIQUIS (apixaban)  Why was Eliquis prescribed for you? Eliquis was prescribed to treat blood clots that may have been found in the veins of your legs (deep vein thrombosis) or in your lungs (pulmonary embolism) and to reduce the risk of them occurring again.  What do You need to know about Eliquis ? The starting dose is 10 mg (two 5 mg tablets) taken TWICE daily for the FIRST SEVEN (7) DAYS, then on 09/17/2020  the dose is reduced to ONE 5 mg tablet taken TWICE daily.  Eliquis may be taken with or without food.   Try to take the dose about the same time in the morning and in the evening. If you have difficulty swallowing the tablet whole please discuss with your pharmacist how to take the medication safely.  Take Eliquis exactly as prescribed and DO NOT stop taking Eliquis without talking to the doctor who prescribed the medication.  Stopping may increase your risk of developing a new blood clot.  Refill your prescription before you run out.  After discharge, you should have regular check-up appointments with your healthcare provider that is prescribing your Eliquis.    What do you do if you miss a dose? If a dose of ELIQUIS is not taken at the scheduled time, take it as soon as possible on the same day and twice-daily administration should be resumed. The dose should not be doubled to make up for a missed dose.  Important Safety Information A possible side effect of Eliquis is bleeding. You should call your healthcare provider right away if you experience any of the following: ? Bleeding from an injury or your nose that does not stop. ? Unusual colored urine (red or dark brown) or unusual colored stools (red or black). ? Unusual bruising for unknown reasons. ? A serious fall or if you hit your head (even if there is no bleeding).  Some medicines may interact with Eliquis and might increase your risk of bleeding or clotting while on Eliquis. To help  avoid this, consult your healthcare provider or pharmacist prior to using any new prescription or non-prescription medications, including herbals, vitamins, non-steroidal anti-inflammatory drugs (NSAIDs) and supplements.  This website has more information on Eliquis (apixaban): http://www.eliquis.com/eliquis/home

## 2020-09-10 NOTE — Evaluation (Signed)
Occupational Therapy Evaluation Patient Details Name: [name] MRN: [id number] DOB: [birth year] Today's Date: 09/10/2020    History of Present Illness [name] is a 74 y.o. male with medical history significant of nonischemic cardiomyopathy with ejection fraction of 25-30%, status post ICD placement, permanent A. fib/atrial flutter-on Coumadin, hypertension, hyperlipidemia, depression/anxiety, GERD, presents to emergency department with worsening shortness of breath and cough since 1 week. Tested pos for covid.    Clinical Impression   Patient admitted with the above diagnosis.  Presents with generalized weakness and poor activity tolerance which is impacting mobility, balance and self care.  Prior to hosptalization he used a cane for mobility, but was largely independent for self care and assisted around the home with home management and meals.  Patient was trailed on 20 L this morning on 60% FiO2; but nursing in during treatment to change the setting and increase O2.  Patient did de-sat to 83% while talking, mask placed and sats jumped to 95%.  OT to continue in the acute level and patient is hoping to return [facility] with [facility] services.  Final recommendation of HH versus SNF will depend on progress.      Follow Up Recommendations  Home health OT    Equipment Recommendations  3 in 1 bedside commode;Tub/shower seat    Recommendations for Other Services       Precautions / Restrictions Precautions Precautions: Fall      Mobility Bed Mobility               General bed mobility comments: patient was decrease to 30% FIO2, but nursing bumping back up to 40% FIO2.  De-sat to 83% with talking.  Transfers                                                                 ADL either performed or assessed with clinical judgement   ADL Overall ADL's : Needs assistance/impaired Eating/Feeding: Bed level   Grooming: Wash/dry face;Wash/dry  hands;Independent;Bed level   Upper Body Bathing: Moderate assistance;Bed level   Lower Body Bathing: Maximal assistance       Lower Body Dressing: Maximal assistance;Bed level                       Vision Baseline Vision/History: Wears glasses Wears Glasses: At all times Patient Visual Report: No change from baseline Vision Assessment?: No apparent visual deficits     Perception     Praxis      Pertinent Vitals/Pain Pain Assessment: No/denies pain     Hand Dominance Left   Extremity/Trunk Assessment Upper Extremity Assessment Upper Extremity Assessment: Generalized weakness           Communication Communication Communication: No difficulties   Cognition Arousal/Alertness: Awake/alert Behavior During Therapy: WFL for tasks assessed/performed Overall Cognitive Status: Within Functional Limits for tasks assessed                                                Shoulder Instructions      Home Living Family/patient expects to be discharged to:: Private residence Living Arrangements: Spouse/significant other Available Help at  Discharge: Family;Available 24 hours/day Type of Home: House       Home Layout: One level     Bathroom Shower/Tub: Occupational psychologist: Standard     Home Equipment: Environmental consultant - 2 wheels;Cane - single point;Bedside commode;Shower seat          Prior Functioning/Environment Level of Independence: Independent with assistive device(s)        Comments: SPC for mobility.  Patient shares home mangement and meal prep with spouse.        OT Problem List: Decreased strength;Decreased activity tolerance;Impaired balance (sitting and/or standing);Decreased knowledge of use of DME or AE      OT Treatment/Interventions: Self-care/ADL training;Therapeutic exercise;Energy conservation;DME and/or AE instruction;Therapeutic activities;Patient/family education;Balance training    OT Goals(Current  goals can be found in the care plan section) Acute Rehab OT Goals Patient Stated Goal: I hope I can just go home and do therapy there OT Goal Formulation: With patient Time For Goal Achievement: 09/24/20 Potential to Achieve Goals: Fair ADL Goals Pt Will Perform Grooming: with set-up;sitting Pt Will Perform Upper Body Bathing: with supervision;sitting Pt Will Perform Upper Body Dressing: with supervision;sitting Pt Will Transfer to Toilet: with set-up;stand pivot transfer Pt/caregiver will Perform Home Exercise Program: Increased strength;Both right and left upper extremity;With Supervision  OT Frequency: Min 2X/week   Barriers to D/C: Other (comment)  medical status       Co-evaluation              AM-PAC OT "6 Clicks" Daily Activity     Outcome Measure Help from another person eating meals?: None Help from another person taking care of personal grooming?: A Little Help from another person toileting, which includes using toliet, bedpan, or urinal?: A Lot Help from another person bathing (including washing, rinsing, drying)?: A Lot Help from another person to put on and taking off regular upper body clothing?: A Lot Help from another person to put on and taking off regular lower body clothing?: A Lot 6 Click Score: 15   End of Session Equipment Utilized During Treatment: Oxygen  Activity Tolerance: Patient limited by fatigue Patient left: in bed;with call bell/phone within reach;with nursing/sitter in room  OT Visit Diagnosis: Unsteadiness on feet (R26.81);Muscle weakness (generalized) (M62.81)                Time: 4098-1191 OT Time Calculation (min): 18 min Charges:  OT General Charges $OT Visit: 1 Visit OT Evaluation $OT Eval Moderate Complexity: 1 Mod  09/10/2020  Rich, OTR/L  Acute Rehabilitation Services  Office:  [place] 09/10/2020, 9:04 AM

## 2020-09-10 NOTE — Progress Notes (Signed)
PROGRESS NOTE  [name] HGD:[id number] DOB: [birth year] DOA: 08/25/2020 PCP: [name], MD   LOS: 15 days   Brief Narrative / Interim history: [name] is an 74 y.o. male past medical history significant for nonischemic cardiomyopathy with an EF of 25% status post AICD, permanent atrial fibrillation/atrial flutter on Coumadin who is fully vaccinated in March went [place] several days prior to admission started developing shortness of breath at rest with cough nasal congestion loose stools and generalized weakness.  In the ED was found tachypneic hypoxic placed on 2 L of oxygen SARS-CoV-2 PCR was positive tropes 25 D-dimer 1.6 BNP of 425 creatinine at baseline chest x-ray showed bilateral diffuse groundglass opacities asymmetric.  Subjective / 24h Interval events: Breathing better, still complains of a dry nose  Assessment & Plan:  Principal Problem Acute Hypoxic Respiratory Failure due to Covid-19 Viral Illness -Remains on heated high flow, worsening today, on 9/20 was on 40% FiO2 but since 9/21 his oxygen requirements have gone back up a little bit.  Finally improving now, 20 L this morning on 60% FiO2 FiO2 (%):  [50 %-80 %] 60 %  -Repeat chest x-ray on 9/21 slightly worse on the left with features concerning for consolidation, given leukocytosis cannot fully exclude bacterial superinfection and has been started on cefepime on 9/21, plan for 5-7 days -Patient has refused Actemra and baricitinib but he completed remdesivir -Continue steroids given high likelihood of developing fibrosis, now on daily dosing.  COVID-19 Labs  No results for input(s): DDIMER, FERRITIN, LDH, CRP in the last 72 hours.  Lab Results  Component Value Date   [place] (A) 08/26/2020   [place] NEGATIVE 04/21/2020    Active Problems Acute bilateral lower extremity DVT -this was despite being on Coumadin.  Currently on heparin infusion, no bleeding, continue.   Anticoagulation options discussed with patient, he prefers Eliquis.  Transition to Eliquis today.  Leukocytosis, concern for HCAP-possibly due to steroids also however he had a significant jump to 25,000 on 9/20 and was 21,000 on 9/21.  Repeat chest x-ray on 9/21 showed worsening left airspace disease which looks slightly more consolidative.  Patient was started on cefepime, WBC improving, CBC pending this morning  Acute on chronic systolic CHF-continue Lasix  Permanent A. fib-on heparin, metoprolol  Chronic kidney disease stage IIIa-fluctuates daily but overall at baseline, continue Lasix, continue to closely monitor  CAD status post PCI-continue statin, metoprolol, losartan, aspirin  Hyperlipidemia-continue statins  Small amount of black stool-he does have thrombosed external hemorrhoids on colonoscopy in 2017.  Scant blood noted by the nurse.  Continue suppositories for hemorrhoids  In-hospital delirium-resolved   Oral thrush -status post 7 days of Diflucan  Scheduled Meds: . albuterol  2 puff Inhalation TID  . vitamin C  500 mg Oral Daily  . digoxin  0.125 mg Oral Daily  . fenofibrate  160 mg Oral Daily  . ferrous sulfate  325 mg Oral BID  . folic acid  1 mg Oral QPM  . furosemide  40 mg Oral Daily  . hydrocortisone  25 mg Rectal BID  . methylPREDNISolone (SOLU-MEDROL) injection  40 mg Intravenous Daily  . metoprolol succinate  25 mg Oral Daily  . oxymetazoline  1 spray Each Nare BID  . pantoprazole  40 mg Oral BID  . pravastatin  40 mg Oral Daily  . saline  1 application Each Nare D6Q  . sertraline  150 mg Oral Q1400  . sodium chloride  1 spray Each Nare Q6H  . zinc sulfate  220 mg Oral Daily   Continuous Infusions: . sodium chloride    . ceFEPime (MAXIPIME) IV Stopped (09/10/20 0557)  . heparin 1,100 Units/hr (09/09/20 2152)   PRN Meds:.sodium chloride, acetaminophen, antiseptic oral rinse, chlorpheniramine-HYDROcodone, haloperidol lactate, nitroGLYCERIN, ondansetron  **OR** ondansetron (ZOFRAN) IV, traMADol-acetaminophen, zolpidem  DVT prophylaxis: heparin  Code Status: Full code Family Communication: called wife Santiago Glad [phone]   Status is: Inpatient  Remains inpatient appropriate because:Inpatient level of care appropriate due to severity of illness  Dispo: The patient is from: Home              Anticipated d/c is to: Home              Anticipated d/c date is: > 3 days              Patient currently is not medically stable to d/c.  Consultants:  None   Procedures:  None   Microbiology: None   Antibacterials: None    Objective: Vitals:   09/10/20 0312 09/10/20 0500 09/10/20 0522 09/10/20 0720  BP:   117/63   Pulse: 66  69 73  Resp: 18  (!) 21 20  Temp:   97.7 F (36.5 C)   TempSrc:   Oral   SpO2: 93%  92% 90%  Weight:  83.4 kg    Height:        Intake/Output Summary (Last 24 hours) at 09/10/2020 0808 Last data filed at 09/10/2020 0600 Gross per 24 hour  Intake 1122.2 ml  Output 1650 ml  Net -527.8 ml   Filed Weights   09/08/20 0516 09/09/20 0456 09/10/20 0500  Weight: 85 kg 81.5 kg 83.4 kg    Examination: Constitutional: No distress, in bed Eyes: no scleral icterus ENMT: Moist mucous membranes Neck: normal, supple Respiratory: Bibasilar rhonchi, moves air well, no wheezing heard.  Tachypneic Cardiovascular: Regular rate and rhythm, no murmurs appreciated, trace edema Abdomen: Soft, nontender, nondistended, bowel sounds positive Musculoskeletal: no clubbing / cyanosis.  Skin: No rashes appreciated Neurologic: No focal deficits  Data Reviewed: I have independently reviewed following labs and imaging studies   CBC: Recent Labs  Lab 09/05/20 0635 09/06/20 1805 09/07/20 0811 09/08/20 0051 09/09/20 0218  WBC 19.1* 19.7* 25.0* 21.9* 18.0*  HGB 12.3* 12.4* 11.7* 12.0* 12.6*  HCT 39.0 40.0 37.5* 38.1* 40.1  MCV 81.8 81.5 81.0 81.1 80.5  PLT 229 255 226 215 626   Basic Metabolic Panel: Recent Labs  Lab  09/04/20 1028 09/05/20 0635 09/06/20 1805 09/08/20 0051 09/09/20 0218  NA 137 138 135 137 134*  K 3.7 4.0 4.2 3.7 3.6  CL 99 99 100 101 97*  CO2 26 30 26 27 26   GLUCOSE 137* 96 136* 94 90  BUN 36* 37* 31* 29* 33*  CREATININE 1.42* 1.15 1.07 1.10 1.06  CALCIUM 8.8* 8.6* 8.5* 8.8* 8.6*  MG  --   --   --   --  1.9   GFR: Estimated Creatinine Clearance: 65 mL/min (by C-G formula based on SCr of 1.06 mg/dL). Liver Function Tests: No results for input(s): AST, ALT, ALKPHOS, BILITOT, PROT, ALBUMIN in the last 168 hours. No results for input(s): LIPASE, AMYLASE in the last 168 hours. No results for input(s): AMMONIA in the last 168 hours. Coagulation Profile: Recent Labs  Lab 09/05/20 0635 09/06/20 1805 09/07/20 0811 09/08/20 0051 09/09/20 0218  INR 3.8* 2.7* 2.0* 1.9* 1.6*   Cardiac Enzymes: No results for  input(s): CKTOTAL, CKMB, CKMBINDEX, TROPONINI in the last 168 hours. BNP (last 3 results) No results for input(s): PROBNP in the last 8760 hours. HbA1C: No results for input(s): HGBA1C in the last 72 hours. CBG: No results for input(s): GLUCAP in the last 168 hours. Lipid Profile: No results for input(s): CHOL, HDL, LDLCALC, TRIG, CHOLHDL, LDLDIRECT in the last 72 hours. Thyroid Function Tests: No results for input(s): TSH, T4TOTAL, FREET4, T3FREE, THYROIDAB in the last 72 hours. Anemia Panel: No results for input(s): VITAMINB12, FOLATE, FERRITIN, TIBC, IRON, RETICCTPCT in the last 72 hours. Urine analysis:    Component Value Date/Time   COLORURINE AMBER (A) 09/29/2017 2310   APPEARANCEUR HAZY (A) 09/29/2017 2310   LABSPEC 1.013 09/29/2017 2310   PHURINE 5.0 09/29/2017 2310   GLUCOSEU NEGATIVE 09/29/2017 2310   HGBUR NEGATIVE 09/29/2017 [place] 09/29/2017 2310   KETONESUR NEGATIVE 09/29/2017 2310   PROTEINUR NEGATIVE 09/29/2017 2310   NITRITE NEGATIVE 09/29/2017 2310   LEUKOCYTESUR NEGATIVE 09/29/2017 2310   Sepsis Labs: Invalid input(s):  PROCALCITONIN, LACTICIDVEN  Recent Results (from the past 240 hour(s))  MRSA PCR Screening     Status: None   Collection Time: 09/09/20  4:38 AM   Specimen: Nasal Mucosa; Nasopharyngeal  Result Value Ref Range Status   MRSA by PCR NEGATIVE NEGATIVE Final    Comment:        The GeneXpert MRSA Assay (FDA approved for NASAL specimens only), is one component of a comprehensive MRSA colonization surveillance program. It is not intended to diagnose MRSA infection nor to guide or monitor treatment for MRSA infections. Performed at [facility], [place] [address] [place], [place] [address]       Radiology Studies: DG CHEST PORT 1 VIEW  Result Date: 09/08/2020 CLINICAL DATA:  Shortness of breath, COVID-19 positive EXAM: PORTABLE CHEST 1 VIEW COMPARISON:  08/31/2020, 09/01/2020 FINDINGS: Right-sided implanted cardiac device, unchanged. Cardiomegaly. Aortic atherosclerosis. Diffuse airspace consolidation throughout the left lung has become slightly more consolidative compared to the prior x-ray. Progressive predominantly interstitial opacities throughout the right lung. No large pleural fluid collection. No pneumothorax identified. IMPRESSION: Slight interval worsening of diffuse bilateral airspace opacities, left greater than right. Electronically Signed   By: Davina Poke D.O.   On: 09/08/2020 10:07    Marzetta Board, MD, PhD [facility]  Between 7 am - 7 pm I am available, please contact me via Amion or Securechat  Between 7 pm - 7 am I am not available, please contact night coverage MD/APP via Amion

## 2020-09-10 NOTE — Progress Notes (Signed)
[place] for Heparin Indication: DVT and r/o PE  Allergies  Allergen Reactions  . Keflex [Cephalexin] Other (See Comments)    dizziness    Patient Measurements: Height: 5\' 10"  (177.8 cm) Weight: 83.4 kg (183 lb 13.8 oz) IBW/kg (Calculated) : 73 Heparin Dosing Weight: 85 kg  Vital Signs: Temp: 97.7 F (36.5 C) (09/23 0522) Temp Source: Oral (09/23 0522) BP: 117/63 (09/23 0522) Pulse Rate: 73 (09/23 0720)  Labs: Recent Labs    09/07/20 1520 09/08/20 0051 09/09/20 0218  HGB  --  12.0* 12.6*  HCT  --  38.1* 40.1  PLT  --  215 209  LABPROT  --  21.0* 18.7*  INR  --  1.9* 1.6*  HEPARINUNFRC 0.52 0.57 0.68  CREATININE  --  1.10 1.06    Estimated Creatinine Clearance: 65 mL/min (by C-G formula based on SCr of 1.06 mg/dL).  Assessment: 74 yr old male presented with SOB, cough, found to be covid+ (pt is fully vaccinated).  Patient is on warfarin 3 mg daily PTA. INR day prior to admit as outpatient was 2.5. Admission INR was 2.5 >> 3.6 on hospital day 2, then was above 4 ever since (last warfarin dose was 9/8).  Pharmacy discussed with Dr Aileen Fass the fact patient has now had acute DVT while with a supratherapeutic INR. He was concerned this was acute phase reactant associated with COVID (since all other markers going up). No change in LFTs. Could also just be warfarin failure in setting of COVID associated thrombosis.  Still awaiting AM labs. Plan to change to apixaban. Hgb 12.6, plt 209, INR 1.6 yesterday. No s/sx of bleeding.   Goal of Therapy:  Heparin level 0.3-0.7 units/ml Monitor platelets by anticoagulation protocol: Yes   Plan:  Stop heparin infusion Change to apixaban 10 mg BID x7 days then 5 mg BID thereafter Monitor CBC and for s/sx of bleeding  Will educate   Antonietta Jewel, PharmD, [place] Pharmacist  Phone: [phone] 09/10/2020 8:13 AM  Please check AMION for all [place] phone numbers After 10:00  PM, call [place] [phone]

## 2020-09-11 LAB — CBC
HCT: 38.8 % — ABNORMAL LOW (ref 39.0–52.0)
Hemoglobin: 12.3 g/dL — ABNORMAL LOW (ref 13.0–17.0)
MCH: 26 pg (ref 26.0–34.0)
MCHC: 31.7 g/dL (ref 30.0–36.0)
MCV: 82 fL (ref 80.0–100.0)
Platelets: 220 10*3/uL (ref 150–400)
RBC: 4.73 MIL/uL (ref 4.22–5.81)
RDW: 16.3 % — ABNORMAL HIGH (ref 11.5–15.5)
WBC: 16.4 10*3/uL — ABNORMAL HIGH (ref 4.0–10.5)
nRBC: 0.1 % (ref 0.0–0.2)

## 2020-09-11 LAB — BASIC METABOLIC PANEL
Anion gap: 11 (ref 5–15)
BUN: 28 mg/dL — ABNORMAL HIGH (ref 8–23)
CO2: 25 mmol/L (ref 22–32)
Calcium: 9.1 mg/dL (ref 8.9–10.3)
Chloride: 101 mmol/L (ref 98–111)
Creatinine, Ser: 1.05 mg/dL (ref 0.61–1.24)
GFR calc Af Amer: >60 mL/min (ref >60)
GFR calc non Af Amer: >60 mL/min (ref >60)
Glucose, Bld: 92 mg/dL (ref 70–99)
Potassium: 3.2 mmol/L — ABNORMAL LOW (ref 3.5–5.1)
Sodium: 137 mmol/L (ref 135–145)

## 2020-09-11 MED ORDER — POTASSIUM CHLORIDE CRYS ER 20 MEQ PO TBCR
40.0000 meq | EXTENDED_RELEASE_TABLET | Freq: Once | ORAL | Status: AC
  Administered 2020-09-11: 40 meq via ORAL
  Filled 2020-09-11: qty 2

## 2020-09-11 MED ORDER — METHYLPREDNISOLONE SODIUM SUCC 40 MG IJ SOLR
30.0000 mg | Freq: Every day | INTRAMUSCULAR | Status: DC
  Administered 2020-09-12: 30 mg via INTRAVENOUS
  Filled 2020-09-11: qty 1

## 2020-09-11 MED ORDER — INFLUENZA VAC A&B SA ADJ QUAD 0.5 ML IM PRSY
0.5000 mL | PREFILLED_SYRINGE | INTRAMUSCULAR | Status: DC | PRN
  Filled 2020-09-11: qty 0.5

## 2020-09-11 MED ORDER — POTASSIUM CHLORIDE CRYS ER 20 MEQ PO TBCR
20.0000 meq | EXTENDED_RELEASE_TABLET | Freq: Every day | ORAL | Status: DC
  Administered 2020-09-12 – 2020-09-15 (×4): 20 meq via ORAL
  Filled 2020-09-11 (×4): qty 1

## 2020-09-11 MED ORDER — POLYETHYLENE GLYCOL 3350 17 G PO PACK
17.0000 g | PACK | Freq: Two times a day (BID) | ORAL | Status: AC
  Administered 2020-09-11 – 2020-09-13 (×4): 17 g via ORAL
  Filled 2020-09-11 (×6): qty 1

## 2020-09-11 MED ORDER — PNEUMOCOCCAL VAC POLYVALENT 25 MCG/0.5ML IJ INJ: 0.5000 mL | INJECTION | INTRAMUSCULAR | Status: DC | PRN

## 2020-09-11 NOTE — Progress Notes (Signed)
Physical Therapy Treatment Patient Details Name: [name] MRN: [id number] DOB: 09/28/1946 Today's Date: 09/11/2020    History of Present Illness ROLLIN KOTOWSKI is a 74 y.o. male with medical history significant of nonischemic cardiomyopathy with ejection fraction of 25-30%, status post ICD placement, permanent A. fib/atrial flutter-on Coumadin, hypertension, hyperlipidemia, depression/anxiety, GERD, presents to emergency department with worsening shortness of breath and cough since 1 week. Tested pos for covid.     PT Comments    Pt supine in bed on arrival.  He recently worked with OT today and sat in recliner x 1 hour but reports discomfort from hemorrhoids.  Focused on LE strengthening in supine with lengthy rest breaks between reps.  Pt reports discomfort in R knee and hip today.  Plan remains for snf placement at d/c.       Follow Up Recommendations  SNF;Supervision/Assistance - 24 hour     Equipment Recommendations  None recommended by PT    Recommendations for Other Services       Precautions / Restrictions Precautions Precautions: Fall Precaution Comments: Heated High Flow Restrictions Weight Bearing Restrictions: No    Mobility  Bed Mobility Overal bed mobility: Needs Assistance             General bed mobility comments: Min assistance to boost to [facility].  Transfers                    Ambulation/Gait                 Stairs             Wheelchair Mobility    Modified Rankin (Stroke Patients Only)       Balance                                            Cognition Arousal/Alertness: Awake/alert Behavior During Therapy: WFL for tasks assessed/performed Overall Cognitive Status: Within Functional Limits for tasks assessed                                        Exercises General Exercises - Lower Extremity Ankle Circles/Pumps: AROM;Both;10 reps;Supine Quad Sets: AROM;Both;10  reps;Supine Heel Slides: AROM;Both;10 reps;Supine Hip ABduction/ADduction: AROM;Both;10 reps;Supine Straight Leg Raises: AROM;Both;10 reps;Supine    General Comments        Pertinent Vitals/Pain Pain Assessment: Faces Faces Pain Scale: Hurts little more Pain Location: R hip and knee during flexion Pain Descriptors / Indicators: Discomfort;Grimacing;Guarding    Home Living                      Prior Function            PT Goals (current goals can now be found in the care plan section) Acute Rehab PT Goals Patient Stated Goal: I hope I can just go home and do therapy there Potential to Achieve Goals: Good Progress towards PT goals: Progressing toward goals    Frequency    Min 3X/week      PT Plan Current plan remains appropriate    Co-evaluation              AM-PAC PT "6 Clicks" Mobility   Outcome Measure  Help needed turning from your back to your side while in  a flat bed without using bedrails?: None Help needed moving from lying on your back to sitting on the side of a flat bed without using bedrails?: A Little Help needed moving to and from a bed to a chair (including a wheelchair)?: A Little Help needed standing up from a chair using your arms (e.g., wheelchair or bedside chair)?: A Little Help needed to walk in hospital room?: Total Help needed climbing 3-5 steps with a railing? : Total 6 Click Score: 15    End of Session Equipment Utilized During Treatment: Oxygen Activity Tolerance: Patient limited by fatigue Patient left: with call bell/phone within reach;in bed;with bed alarm set Nurse Communication: Mobility status PT Visit Diagnosis: Unsteadiness on feet (R26.81);Muscle weakness (generalized) (M62.81);Dizziness and giddiness (R42)     Time: 3536-1443 PT Time Calculation (min) (ACUTE ONLY): 48 min  Charges:  $Therapeutic Exercise: 23-37 mins                     Erasmo Leventhal , PTA Acute Rehabilitation Services Pager  [phone] Office [phone]     [facility] Eli Hose 09/11/2020, 4:25 PM

## 2020-09-11 NOTE — Progress Notes (Signed)
Occupational Therapy Treatment Patient Details Name: [name] MRN: [id number] DOB: 18-Jun-1946 Today's Date: 09/11/2020    History of present illness [name] is a 73 y.o. male with medical history significant of nonischemic cardiomyopathy with ejection fraction of 25-30%, status post ICD placement, permanent A. fib/atrial flutter-on Coumadin, hypertension, hyperlipidemia, depression/anxiety, GERD, presents to emergency department with worsening shortness of breath and cough since 1 week. Tested pos for covid.    OT comments  Patient FiO2 increased to 60% on 20 L.  O2 sats monitored throughout.  O2 sats ranged from 92% to a low of 83%.  Therapeutic rest breaks, pursed lip breathing and mask placed to raises saturation levels.  Patient continues to be limited by poor activity tolerance, dizziness with mobility, and generalized weakness.  He participates well and is very talkative, but requires assist with all self care excluding self feeding and supine grooming. Increased time in room for rest breaks and recovery time.  Easily redirected to pursed lip breath.  OT discussed upper body HEP seated, patient able to demo understanding.  OT will look to progress supported sitting ADL independence as appropriate.  OT to continue to follow in the acute setting, he's hopeful to return home with [facility] services.    Follow Up Recommendations  Home health OT    Equipment Recommendations  3 in 1 bedside commode;Tub/shower seat;Wheelchair cushion (measurements OT);Wheelchair (measurements OT)    Recommendations for Other Services      Precautions / Restrictions Precautions Precautions: Fall Precaution Comments: 20 L and 60% FiO2 9/24 Restrictions Weight Bearing Restrictions: No       Mobility Bed Mobility Overal bed mobility: Needs Assistance Bed Mobility: Supine to Sit     Supine to sit: Min guard;HOB elevated        Transfers Overall transfer level: Needs assistance Equipment  used: Rolling walker (2 wheeled)   Sit to Stand: Min guard;From elevated surface Stand pivot transfers: Min assist            Balance   Sitting-balance support: Feet supported;No upper extremity supported Sitting balance-[name] Scale: Fair     Standing balance support: Bilateral upper extremity supported Standing balance-[name] Scale: Fair                                                 Cognition Arousal/Alertness: Awake/alert Behavior During Therapy: WFL for tasks assessed/performed Overall Cognitive Status: Within Functional Limits for tasks assessed                                          Exercises Other Exercises Other Exercises: supported sitting chest press 2 sets and 10 reps. Other Exercises: supported sitting lateral raises 2 sets and 10 reps.   Shoulder Instructions        Prior Functioning/Environment              Frequency  Min 2X/week        Progress Toward Goals  OT Goals(current goals can now be found in the care plan section)  Progress towards OT goals: Progressing toward goals  Acute Rehab OT Goals Patient Stated Goal: I hope I can just go home and do therapy there OT Goal Formulation: With patient Time For Goal Achievement: 09/24/20 Potential to  Achieve Goals: [place] Discharge plan remains appropriate    Co-evaluation                 AM-PAC OT "6 Clicks" Daily Activity     Outcome Measure   Help from another person eating meals?: None Help from another person taking care of personal grooming?: A Little Help from another person toileting, which includes using toliet, bedpan, or urinal?: A Little Help from another person bathing (including washing, rinsing, drying)?: A Lot Help from another person to put on and taking off regular upper body clothing?: A Lot Help from another person to put on and taking off regular lower body clothing?: A Lot 6 Click Score: 16    End of Session  Equipment Utilized During Treatment: Oxygen;Rolling walker  OT Visit Diagnosis: Unsteadiness on feet (R26.81);Muscle weakness (generalized) (M62.81)   Activity Tolerance Patient limited by fatigue   Patient Left in chair;with call bell/phone within reach;with chair alarm set   Nurse Communication Mobility status        Time: 1005-1045 OT Time Calculation (min): 40 min  Charges: OT General Charges $OT Visit: 1 Visit OT Treatments $Self Care/Home Management : 38-52 mins  09/11/2020  Rich, OTR/L  Acute Rehabilitation Services  Office:  [phone]    SHADEN LACHER 09/11/2020, 10:55 AM

## 2020-09-11 NOTE — Progress Notes (Signed)
Pharmacy Antibiotic Note  [name] is a 74 y.o. male admitted on 08/25/2020 with pneumonia.  Pharmacy has been consulted for Cefepime dosing.  COVID PNA. WBC elevated (steroids), afebrile. Fully vaccinated with [place] in May per H&P. On day #4 of antibiotics. Scr stable at 1.05 (CrCl 65 mL/min).   Thrush > diflucan x 7d through 9/21 Remdesivir 9/8>>9/12 Cefepime 9/21>>  9/8: BC x 2: Neg 9/8: COVID +  Plan: Cefepime 2g IV q8hr for total of 5 days - stop date entered    Height: 5\' 10"  (177.8 cm) Weight: 83.3 kg (183 lb 10.3 oz) IBW/kg (Calculated) : 73  Temp (24hrs), Avg:97.8 F (36.6 C), Min:97.4 F (36.3 C), Max:98.2 F (36.8 C)  Recent Labs  Lab 09/06/20 1805 09/06/20 1805 09/07/20 0811 09/08/20 0051 09/09/20 0218 09/10/20 1344 09/10/20 1703 09/11/20 1124  WBC 19.7*   < > 25.0* 21.9* 18.0* 15.2*  --  16.4*  CREATININE 1.07  --   --  1.10 1.06  --  1.11 1.05   < > = values in this interval not displayed.    Estimated Creatinine Clearance: 65.7 mL/min (by C-G formula based on SCr of 1.05 mg/dL).    Allergies  Allergen Reactions  . Keflex [Cephalexin] Other (See Comments)    dizziness    Antonietta Jewel, PharmD, [place] Clinical Pharmacist  Phone: [phone] 09/11/2020 12:55 PM  Please check AMION for all [place] phone numbers After 10:00 PM, call [place] [phone]

## 2020-09-11 NOTE — TOC Benefit Eligibility Note (Signed)
Transition of Care [facility]) Benefit Eligibility Note    Patient Details  Name: [name] MRN: [id number] Date of Birth: [birth year]   Medication/Dose: Eliquis 2.5 mg. and or 5mg .  Covered?: Yes  Tier: 3 Drug  Prescription Coverage Preferred Pharmacy: [facility], [facility] mail order  Spoke with Person/Company/Phone Number:: Amy A. W/[facility] Help Desk.PH# [phone]  Co-Pay: $47.00  Prior Approval: No  Deductible: Unmet       Shelda Altes Phone Number: 09/11/2020, 9:59 AM

## 2020-09-11 NOTE — Progress Notes (Signed)
[facility] PROGRESS NOTE    Progress Note  [name]  KKX:[id number] DOB: August 18, 1946 DOA: 08/25/2020 PCP: [name], MD     Brief Narrative:   [name] is an 74 y.o. male past medical history significant for nonischemic cardiomyopathy with an EF of 25% status post AICD, permanent atrial fibrillation/atrial flutter on Coumadin who is fully vaccinated in March went [place] several days prior to admission started developing shortness of breath at rest with cough nasal congestion loose stools and generalized weakness.  In the ED was found tachypneic hypoxic placed on 2 L of oxygen SARS-CoV-2 PCR was positive tropes 25 D-dimer 1.6 BNP of 425 creatinine at baseline chest x-ray showed bilateral diffuse groundglass opacities asymmetric.  Assessment/Plan:   Acute respiratory failure with hypoxia secondary to Pneumonia due to COVID-19 virus This morning he was requiring 20 L with an FiO2 of 50% to try to keep saturations greater than 90%. CRP and D-dimer has definitely come down. 21 2021 show concerning features with a leukocytosis, so he was started empirically on IV cefepime on 09/08/2020 he will complete a 7-day course. Patient has refused Actemra and baricitinib. He has completed course of IV remdesivir. Wean off steroids, he has completed his 10-day course of IV steroids. He relates his breathing is not better.  Acute on chronic systolic heart failure: Continues to be negative about 7 L, appears euvolemic on physical exam. Creatinine and electrolytes are stable continue to monitor intermittently.  New bilateral lower extremity DVT: CT angio of the chest did not showed large PE but was unable to evaluate subsegmental PE due to contrast timing. Despite being on Coumadin he did develop DVT. He is currently on Eliquis.  Permanent atrial fibrillation: Coumadin has been discontinued he was started on IV heparin now transition to Eliquis which she  will continue as an outpatient, rate controlled continue metoprolol.  Essential hypertension: Continue metoprolol and Lasix. Blood pressure seems to be well controlled.  Chronic kidney disease stage IIIa: Appears to be at baseline.  Coronary artery disease status post PCI: Continue statins, metoprolol, losartan and aspirin.  Hyperlipidemia: Continue fenofibrate and statins.  Small amount of black stool: Resolved. His blood pressure likely due to hemorrhoids, he had a colonoscopy 08/31/2016 that showed thrombosed external hemorrhoids, no further signs of bleeding his hemoglobin has remained stable.  Acute confusional state: Continue Haldol IV as needed.  Oral thrush: Started him IV Diflucan for 7 days.   DVT prophylaxis: coumadin Family Communication:wife Status is: Inpatient  Remains inpatient appropriate because:Persistent severe electrolyte disturbances   Dispo: The patient is from: Home              Anticipated d/c is to: Home              Anticipated d/c date is: > 3 days              Patient currently is not medically stable to d/c. once he is on 4L of oxygen.  He will probably need to go to CIR or skilled nursing facility.  Physical therapy to continue to work with him.  Code Status:     Code Status Orders  (From admission, onward)         Start     Ordered   08/26/20 1045  Full code  Continuous        08/26/20 1046        Code Status History    Date Active Date Inactive  Code Status Order ID Comments User Context   04/24/2020 1610 04/24/2020 2124 Full Code [id number]  [name], MD Inpatient   09/30/2017 0116 10/02/2017 1531 Full Code [id number]  [name], MD ED   04/10/2017 1748 04/14/2017 1428 Full Code [id number]  [name], MD Inpatient   01/25/2017 1411 01/25/2017 2220 Full Code [id number]  [name], MD Inpatient   03/02/2016 1628 03/16/2016 2023 Full Code [id number]  [name] Inpatient   02/18/2016 1408 03/02/2016 1613 Full Code  [id number]  [name], MD ED   Advance Care Planning Activity        IV Access:    Peripheral IV   Procedures and diagnostic studies:   No results found.   Medical Consultants:    None.  Anti-Infectives:   none  Subjective:    [name] relates his breathing is about the same as yesterday.  Objective:    Vitals:   09/11/20 0128 09/11/20 0501 09/11/20 0600 09/11/20 0800  BP:  112/65    Pulse: 69 70    Resp: 12 18    Temp:  98.2 F (36.8 C)    TempSrc:  Oral    SpO2: 95% 91%  93%  Weight:   83.3 kg   Height:       SpO2: 93 % O2 Flow Rate (L/min): 20 L/min FiO2 (%): 50 %   Intake/Output Summary (Last 24 hours) at 09/11/2020 1101 Last data filed at 09/11/2020 1004 Gross per 24 hour  Intake 340 ml  Output 1200 ml  Net -860 ml   Filed Weights   09/09/20 0456 09/10/20 0500 09/11/20 0600  Weight: 81.5 kg 83.4 kg 83.3 kg    Exam: General exam: In no acute distress. Respiratory system: Good air movement and clear to auscultation. Cardiovascular system: S1 & S2 heard, RRR. No JVD. Gastrointestinal system: Abdomen is nondistended, soft and nontender.  Extremities: No pedal edema. Skin: No rashes, lesions or ulcers Psychiatry: Judgement and insight appear normal. Mood & affect appropriate.  Data Reviewed:    Labs: Basic Metabolic Panel: Recent Labs  Lab 09/05/20 0635 09/05/20 0635 09/06/20 1805 09/06/20 1805 09/08/20 0051 09/08/20 0051 09/09/20 0218 09/10/20 1703  NA 138  --  135  --  137  --  134* 136  K 4.0   < > 4.2   < > 3.7   < > 3.6 4.1  CL 99  --  100  --  101  --  97* 100  CO2 30  --  26  --  27  --  26 24  GLUCOSE 96  --  136*  --  94  --  90 120*  BUN 37*  --  31*  --  29*  --  33* 32*  CREATININE 1.15  --  1.07  --  1.10  --  1.06 1.11  CALCIUM 8.6*  --  8.5*  --  8.8*  --  8.6* 8.9  MG  --   --   --   --   --   --  1.9  --    < > = values in this interval not displayed.   GFR Estimated Creatinine Clearance:  62.1 mL/min (by C-G formula based on SCr of 1.11 mg/dL). Liver Function Tests: No results for input(s): AST, ALT, ALKPHOS, BILITOT, PROT, ALBUMIN in the last 168 hours. No results for input(s): LIPASE, AMYLASE in the last 168 hours. No results for input(s): AMMONIA in the last 168  hours. Coagulation profile Recent Labs  Lab 09/05/20 0635 09/06/20 1805 09/07/20 0811 09/08/20 0051 09/09/20 0218  INR 3.8* 2.7* 2.0* 1.9* 1.6*   COVID-19 Labs  No results for input(s): DDIMER, FERRITIN, LDH, CRP in the last 72 hours.  Lab Results  Component Value Date   SARSCOV2NAA POSITIVE (A) 08/26/2020   [place] NEGATIVE 04/21/2020    CBC: Recent Labs  Lab 09/06/20 1805 09/07/20 0811 09/08/20 0051 09/09/20 0218 09/10/20 1344  WBC 19.7* 25.0* 21.9* 18.0* 15.2*  HGB 12.4* 11.7* 12.0* 12.6* 12.3*  HCT 40.0 37.5* 38.1* 40.1 38.9*  MCV 81.5 81.0 81.1 80.5 81.6  PLT 255 226 215 209 190   Cardiac Enzymes: No results for input(s): CKTOTAL, CKMB, CKMBINDEX, TROPONINI in the last 168 hours. BNP (last 3 results) No results for input(s): PROBNP in the last 8760 hours. CBG: No results for input(s): GLUCAP in the last 168 hours. D-Dimer: No results for input(s): DDIMER in the last 72 hours. Hgb A1c: No results for input(s): HGBA1C in the last 72 hours. Lipid Profile: No results for input(s): CHOL, HDL, LDLCALC, TRIG, CHOLHDL, LDLDIRECT in the last 72 hours. Thyroid function studies: No results for input(s): TSH, T4TOTAL, T3FREE, THYROIDAB in the last 72 hours.  Invalid input(s): FREET3 Anemia work up: No results for input(s): VITAMINB12, FOLATE, FERRITIN, TIBC, IRON, RETICCTPCT in the last 72 hours. Sepsis Labs: Recent Labs  Lab 09/07/20 0811 09/08/20 0051 09/09/20 0218 09/10/20 1344  WBC 25.0* 21.9* 18.0* 15.2*   Microbiology Recent Results (from the past 240 hour(s))  MRSA PCR Screening     Status: None   Collection Time: 09/09/20  4:38 AM   Specimen: Nasal Mucosa;  Nasopharyngeal  Result Value Ref Range Status   MRSA by PCR NEGATIVE NEGATIVE Final    Comment:        The GeneXpert MRSA Assay (FDA approved for NASAL specimens only), is one component of a comprehensive MRSA colonization surveillance program. It is not intended to diagnose MRSA infection nor to guide or monitor treatment for MRSA infections. Performed at [facility], [place] [address] [place], [place] [address]      Medications:   . albuterol  2 puff Inhalation TID  . apixaban  10 mg Oral BID   Followed by  . [START ON 09/17/2020] apixaban  5 mg Oral BID  . vitamin C  500 mg Oral Daily  . digoxin  0.125 mg Oral Daily  . fenofibrate  160 mg Oral Daily  . ferrous sulfate  325 mg Oral BID  . folic acid  1 mg Oral QPM  . furosemide  40 mg Oral Daily  . hydrocortisone  25 mg Rectal BID  . methylPREDNISolone (SOLU-MEDROL) injection  40 mg Intravenous Daily  . metoprolol succinate  25 mg Oral Daily  . oxymetazoline  1 spray Each Nare BID  . pantoprazole  40 mg Oral BID  . pravastatin  40 mg Oral Daily  . saline  1 application Each Nare Y1E  . sertraline  150 mg Oral Q1400  . sodium chloride  1 spray Each Nare Q6H  . zinc sulfate  220 mg Oral Daily   Continuous Infusions: . sodium chloride    . ceFEPime (MAXIPIME) IV 2 g (09/11/20 0517)      LOS: 16 days   Charlynne Cousins  [facility]  09/11/2020, 11:01 AM

## 2020-09-12 LAB — CBC
HCT: 34.5 % — ABNORMAL LOW (ref 39.0–52.0)
Hemoglobin: 10.8 g/dL — ABNORMAL LOW (ref 13.0–17.0)
MCH: 25.4 pg — ABNORMAL LOW (ref 26.0–34.0)
MCHC: 31.3 g/dL (ref 30.0–36.0)
MCV: 81.2 fL (ref 80.0–100.0)
Platelets: 205 10*3/uL (ref 150–400)
RBC: 4.25 MIL/uL (ref 4.22–5.81)
RDW: 16.5 % — ABNORMAL HIGH (ref 11.5–15.5)
WBC: 11.8 10*3/uL — ABNORMAL HIGH (ref 4.0–10.5)
nRBC: 0 % (ref 0.0–0.2)

## 2020-09-12 MED ORDER — METHYLPREDNISOLONE SODIUM SUCC 40 MG IJ SOLR: 20.0000 mg | Freq: Every day | INTRAMUSCULAR | Status: DC

## 2020-09-12 NOTE — Progress Notes (Signed)
Pt back bed. Pt was able to move from chair to bed with minimal assist. Mild desat with quick recovery. No other needs or concerns at this time. Will continue to monitor.

## 2020-09-12 NOTE — Progress Notes (Signed)
Pt is in the chair. Mild desaturation with quick recovery. Pt is on doughnut pillow. Pt was strongly encouraged to remain in chair until dinner time. No needs or concerns at this time/ nO signs of distress noted. No needs or concerns at this time. Will continue to monitor.

## 2020-09-12 NOTE — Progress Notes (Signed)
[facility] PROGRESS NOTE    Progress Note  [name]  WOE:[id number] DOB: [birth year] DOA: 08/25/2020 PCP: [name], MD     Brief Narrative:   [name] is an 74 y.o. male past medical history significant for nonischemic cardiomyopathy with an EF of 25% status post AICD, permanent atrial fibrillation/atrial flutter on Coumadin who is fully vaccinated in March went to an out-of[place]state reunion several days prior to admission started developing shortness of breath at rest with cough nasal congestion loose stools and generalized weakness.  In the ED was found tachypneic hypoxic placed on 2 L of oxygen SARS-CoV-2 PCR was positive tropes 25 D-dimer 1.6 BNP of 425 creatinine at baseline chest x-ray showed bilateral diffuse groundglass opacities asymmetric.  Assessment/Plan:   Acute respiratory failure with hypoxia secondary to Pneumonia due to COVID-19 virus This morning he is on 15 L on FiO2 of 30% and saturations have been greater than 88%. Due to the high suspicious of bacterial pneumonia he completed 7-day course of IV antibiotics. Has completed his course of IV remdesivir, will continue to wean off steroids. Keep prone for at least 16 hours a day if not prone out of bed to chair.  Acute on chronic systolic heart failure: Continues to be negative about 7 L, appears euvolemic on physical exam. Creatinine and electrolytes are stable continue to monitor intermittently.  New bilateral lower extremity DVT: CT angio of the chest did not showed large PE but was unable to evaluate subsegmental PE due to contrast timing. Despite being on Coumadin he did develop DVT. He is currently on Eliquis.  Permanent atrial fibrillation: Coumadin has been discontinued he was started on IV heparin now transition to Eliquis which she will continue as an outpatient, rate controlled continue metoprolol.  Essential hypertension: Continue metoprolol and Lasix. Blood pressure seems to be  well controlled.  Chronic kidney disease stage IIIa: Appears to be at baseline.  Coronary artery disease status post PCI: Continue statins, metoprolol, losartan and aspirin.  Hyperlipidemia: Continue fenofibrate and statins.  Small amount of black stool: Resolved. His blood pressure likely due to hemorrhoids, he had a colonoscopy 08/31/2016 that showed thrombosed external hemorrhoids, no further signs of bleeding his hemoglobin has remained stable.  Acute confusional state: Continue Haldol IV as needed.  Oral thrush: He completed 7-day course of IV Diflucan.   DVT prophylaxis: coumadin Family Communication:wife Status is: Inpatient  Remains inpatient appropriate because:Persistent severe electrolyte disturbances   Dispo: The patient is from: Home              Anticipated d/c is to: Home              Anticipated d/c date is: > 3 days              Patient currently is not medically stable to d/c. once he is on 4L of oxygen.  He will probably need to go to CIR or skilled nursing facility.  Physical therapy to continue to work with him.  Code Status:     Code Status Orders  (From admission, onward)         Start     Ordered   08/26/20 1045  Full code  Continuous        08/26/20 1046        Code Status History    Date Active Date Inactive Code Status Order ID Comments User Context   04/24/2020 1531 04/24/2020 2124 Full Code [id number]  [name],  MD Inpatient   09/30/2017 0116 10/02/2017 1531 Full Code [id number]  [name], MD ED   04/10/2017 1748 04/14/2017 1428 Full Code [id number]  [name], MD Inpatient   01/25/2017 1411 01/25/2017 2220 Full Code [id number]  [name], MD Inpatient   03/02/2016 1628 03/16/2016 2023 Full Code [id number]  [name] Inpatient   02/18/2016 1408 03/02/2016 1613 Full Code [id number]  [name], MD ED   Advance Care Planning Activity        IV Access:    Peripheral IV   Procedures and diagnostic studies:    No results found.   Medical Consultants:    None.  Anti-Infectives:   none  Subjective:    [name] no complaints feels better.  Objective:    Vitals:   09/11/20 2209 09/12/20 0242 09/12/20 0604 09/12/20 0848  BP: 113/61  (!) 128/58   Pulse: 66 68 69 71  Resp: 19 14 20    Temp: 98.4 F (36.9 C)  97.7 F (36.5 C)   TempSrc: Oral  Oral   SpO2: 92%  (!) 87%   Weight:   83.8 kg   Height:       SpO2: (!) 87 % O2 Flow Rate (L/min): 15 L/min FiO2 (%): 40 %   Intake/Output Summary (Last 24 hours) at 09/12/2020 0855 Last data filed at 09/12/2020 0555 Gross per 24 hour  Intake 1190 ml  Output 2080 ml  Net -890 ml   Filed Weights   09/10/20 0500 09/11/20 0600 09/12/20 0604  Weight: 83.4 kg 83.3 kg 83.8 kg    Exam: General exam: In no acute distress. Respiratory system: Good air movement with crackles bilaterally. Cardiovascular system: S1 & S2 heard, RRR. No JVD. Gastrointestinal system: Abdomen is nondistended, soft and nontender.  Extremities: No pedal edema. Skin: No rashes, lesions or ulcers Psychiatry: Judgement and insight appear normal. Mood & affect appropriate.  Data Reviewed:    Labs: Basic Metabolic Panel: Recent Labs  Lab 09/06/20 1805 09/06/20 1805 09/08/20 0051 09/08/20 0051 09/09/20 0218 09/09/20 0218 09/10/20 1703 09/11/20 1124  NA 135  --  137  --  134*  --  136 137  K 4.2   < > 3.7   < > 3.6   < > 4.1 3.2*  CL 100  --  101  --  97*  --  100 101  CO2 26  --  27  --  26  --  24 25  GLUCOSE 136*  --  94  --  90  --  120* 92  BUN 31*  --  29*  --  33*  --  32* 28*  CREATININE 1.07  --  1.10  --  1.06  --  1.11 1.05  CALCIUM 8.5*  --  8.8*  --  8.6*  --  8.9 9.1  MG  --   --   --   --  1.9  --   --   --    < > = values in this interval not displayed.   GFR Estimated Creatinine Clearance: 65.7 mL/min (by C-G formula based on SCr of 1.05 mg/dL). Liver Function Tests: No results for input(s): AST, ALT, ALKPHOS, BILITOT,  PROT, ALBUMIN in the last 168 hours. No results for input(s): LIPASE, AMYLASE in the last 168 hours. No results for input(s): AMMONIA in the last 168 hours. Coagulation profile Recent Labs  Lab 09/06/20 1805 09/07/20 0811 09/08/20 0051 09/09/20 0218  INR 2.7* 2.0* 1.9*  1.6*   COVID-19 Labs  No results for input(s): DDIMER, FERRITIN, LDH, CRP in the last 72 hours.  Lab Results  Component Value Date   SARSCOV2NAA POSITIVE (A) 08/26/2020   [place] NEGATIVE 04/21/2020    CBC: Recent Labs  Lab 09/08/20 0051 09/09/20 0218 09/10/20 1344 09/11/20 1124 09/12/20 0619  WBC 21.9* 18.0* 15.2* 16.4* 11.8*  HGB 12.0* 12.6* 12.3* 12.3* 10.8*  HCT 38.1* 40.1 38.9* 38.8* 34.5*  MCV 81.1 80.5 81.6 82.0 81.2  PLT 215 209 190 220 205   Cardiac Enzymes: No results for input(s): CKTOTAL, CKMB, CKMBINDEX, TROPONINI in the last 168 hours. BNP (last 3 results) No results for input(s): PROBNP in the last 8760 hours. CBG: No results for input(s): GLUCAP in the last 168 hours. D-Dimer: No results for input(s): DDIMER in the last 72 hours. Hgb A1c: No results for input(s): HGBA1C in the last 72 hours. Lipid Profile: No results for input(s): CHOL, HDL, LDLCALC, TRIG, CHOLHDL, LDLDIRECT in the last 72 hours. Thyroid function studies: No results for input(s): TSH, T4TOTAL, T3FREE, THYROIDAB in the last 72 hours.  Invalid input(s): FREET3 Anemia work up: No results for input(s): VITAMINB12, FOLATE, FERRITIN, TIBC, IRON, RETICCTPCT in the last 72 hours. Sepsis Labs: Recent Labs  Lab 09/09/20 0218 09/10/20 1344 09/11/20 1124 09/12/20 0619  WBC 18.0* 15.2* 16.4* 11.8*   Microbiology Recent Results (from the past 240 hour(s))  MRSA PCR Screening     Status: None   Collection Time: 09/09/20  4:38 AM   Specimen: Nasal Mucosa; Nasopharyngeal  Result Value Ref Range Status   MRSA by PCR NEGATIVE NEGATIVE Final    Comment:        The GeneXpert MRSA Assay (FDA approved for NASAL  specimens only), is one component of a comprehensive MRSA colonization surveillance program. It is not intended to diagnose MRSA infection nor to guide or monitor treatment for MRSA infections. Performed at [facility], [place] [address], [place], [place] [address]      Medications:    albuterol  2 puff Inhalation TID   apixaban  10 mg Oral BID   Followed by   Derrill Memo ON 09/17/2020] apixaban  5 mg Oral BID   vitamin C  500 mg Oral Daily   digoxin  0.125 mg Oral Daily   fenofibrate  160 mg Oral Daily   ferrous sulfate  325 mg Oral BID   folic acid  1 mg Oral QPM   furosemide  40 mg Oral Daily   hydrocortisone  25 mg Rectal BID   methylPREDNISolone (SOLU-MEDROL) injection  30 mg Intravenous Daily   metoprolol succinate  25 mg Oral Daily   oxymetazoline  1 spray Each Nare BID   pantoprazole  40 mg Oral BID   polyethylene glycol  17 g Oral BID   potassium chloride  20 mEq Oral Daily   pravastatin  40 mg Oral Daily   saline  1 application Each Nare S0F   sertraline  150 mg Oral Q1400   sodium chloride  1 spray Each Nare Q6H   zinc sulfate  220 mg Oral Daily   Continuous Infusions:  sodium chloride 250 mL (09/12/20 0555)   ceFEPime (MAXIPIME) IV 2 g (09/12/20 0555)      LOS: 17 days   Charlynne Cousins  [facility]  09/12/2020, 8:55 AM

## 2020-09-13 LAB — CBC
HCT: 35.6 % — ABNORMAL LOW (ref 39.0–52.0)
Hemoglobin: 11.3 g/dL — ABNORMAL LOW (ref 13.0–17.0)
MCH: 25.7 pg — ABNORMAL LOW (ref 26.0–34.0)
MCHC: 31.7 g/dL (ref 30.0–36.0)
MCV: 81.1 fL (ref 80.0–100.0)
Platelets: 240 10*3/uL (ref 150–400)
RBC: 4.39 MIL/uL (ref 4.22–5.81)
RDW: 16.6 % — ABNORMAL HIGH (ref 11.5–15.5)
WBC: 11.6 10*3/uL — ABNORMAL HIGH (ref 4.0–10.5)
nRBC: 0 % (ref 0.0–0.2)

## 2020-09-13 MED ORDER — METHYLPREDNISOLONE SODIUM SUCC 40 MG IJ SOLR
10.0000 mg | Freq: Every day | INTRAMUSCULAR | Status: DC
  Administered 2020-09-13: 10 mg via INTRAVENOUS
  Filled 2020-09-13 (×2): qty 1

## 2020-09-13 MED ORDER — POTASSIUM CHLORIDE CRYS ER 20 MEQ PO TBCR
40.0000 meq | EXTENDED_RELEASE_TABLET | Freq: Two times a day (BID) | ORAL | Status: AC
  Administered 2020-09-13 (×2): 40 meq via ORAL
  Filled 2020-09-13 (×2): qty 2

## 2020-09-13 NOTE — Progress Notes (Signed)
[facility] PROGRESS NOTE    Progress Note  [name]  ZOX:[id number] DOB: 11/03/1946 DOA: 08/25/2020 PCP: [name], MD     Brief Narrative:   [name] is an 74 y.o. male past medical history significant for nonischemic cardiomyopathy with an EF of 25% status post AICD, permanent atrial fibrillation/atrial flutter on Coumadin who is fully vaccinated in March went [place] an [place] several days prior to admission started developing shortness of breath at rest with cough nasal congestion loose stools and generalized weakness.  In the ED was found tachypneic hypoxic placed on 2 L of oxygen SARS-CoV-2 PCR was positive tropes 25 D-dimer 1.6 BNP of 425 creatinine at baseline chest x-ray showed bilateral diffuse groundglass opacities asymmetric.  Assessment/Plan:   Acute respiratory failure with hypoxia secondary to Pneumonia due to COVID-19 virus This morning he is on 15 L on FiO2 of 30% and saturations have been greater than 88%. Due to the high suspicious of bacterial pneumonia he completed 7-day course of IV antibiotics. Has completed his course of IV remdesivir, will continue to wean off steroids. Keep encouraging the patient to prone for at least 16 hours a day, keep him out of the bed into the chair encourage him to use incentive spirometry as much as possible. Duration is continue to pain tenuous he is requiring a lot of oxygen to keep saturations greater than 88%. He appears comfortable in bed. He can come off isolation on 9.29.2021  Acute on chronic systolic heart failure: He continues to be negative, but totally 5 L, appears euvolemic on physical exam. Creatinine and electrolytes are stable continue to monitor intermittently.  New bilateral lower extremity DVT: CT angio of the chest did not showed large PE but was unable to evaluate subsegmental PE due to contrast timing. Despite being on Coumadin he did develop DVT. He is currently on  Eliquis.  Permanent atrial fibrillation: Coumadin has been discontinued he was started on IV heparin now transition to Eliquis which she will continue as an outpatient, rate controlled continue metoprolol.  Essential hypertension: Continue metoprolol and Lasix. Blood pressure seems to be well controlled.  Chronic kidney disease stage IIIa: Appears to be at baseline.  Coronary artery disease status post PCI: Continue statins, metoprolol and aspirin.  Hyperlipidemia: Continue fenofibrate and statins.  Small amount of black stool: Resolved. His blood pressure likely due to hemorrhoids, he had a colonoscopy 08/31/2016 that showed thrombosed external hemorrhoids, no further signs of bleeding his hemoglobin has remained stable.  Acute confusional state: Continue Haldol IV as needed.  Oral thrush: He completed 7-day course of IV Diflucan.   DVT prophylaxis: coumadin Family Communication:wife Status is: Inpatient  Remains inpatient appropriate because:Persistent severe electrolyte disturbances   Dispo: The patient is from: Home              Anticipated d/c is to: Home              Anticipated d/c date is: > 3 days              Patient currently is not medically stable to d/c. once he is on 4L of oxygen.  He will probably need to go to CIR or skilled nursing facility.  Physical therapy to continue to work with him.  Code Status:     Code Status Orders  (From admission, onward)         Start     Ordered   08/26/20 1045  Full code  Continuous        08/26/20 1046        Code Status History    Date Active Date Inactive Code Status Order ID Comments User Context   04/24/2020 1531 04/24/2020 2124 Full Code [id number]  [name], MD Inpatient   09/30/2017 0116 10/02/2017 1531 Full Code [id number]  [name], MD ED   04/10/2017 1748 04/14/2017 1428 Full Code [id number]  [name], MD Inpatient   01/25/2017 1411 01/25/2017 2220 Full Code [id number]  [name], MD  Inpatient   03/02/2016 1628 03/16/2016 2023 Full Code [id number]  [name] Inpatient   02/18/2016 1408 03/02/2016 1613 Full Code [id number]  [name], MD ED   Advance Care Planning Activity        IV Access:    Peripheral IV   Procedures and diagnostic studies:   No results found.   Medical Consultants:    None.  Anti-Infectives:   none  Subjective:    [name] no complaints this morning.  Objective:    Vitals:   09/12/20 1900 09/12/20 2213 09/13/20 0304 09/13/20 0536  BP:  (!) 154/132  (!) 113/58  Pulse: 70 84 69 68  Resp: 18 (!) 24 20 (!) 21  Temp:  98.7 F (37.1 C)  98.2 F (36.8 C)  TempSrc:  Oral  Oral  SpO2: 90% 94% 100% 91%  Weight:      Height:       SpO2: 91 % O2 Flow Rate (L/min): 20 L/min FiO2 (%): 50 %   Intake/Output Summary (Last 24 hours) at 09/13/2020 0840 Last data filed at 09/12/2020 2214 Gross per 24 hour  Intake 360 ml  Output --  Net 360 ml   Filed Weights   09/10/20 0500 09/11/20 0600 09/12/20 0604  Weight: 83.4 kg 83.3 kg 83.8 kg    Exam: General exam: In no acute distress. Respiratory system: Good air movement and diffuse crackles bilaterally they seem to be improving. Cardiovascular system: S1 & S2 heard, RRR. No JVD. Gastrointestinal system: Abdomen is nondistended, soft and nontender.  Extremities: No pedal edema. Skin: No rashes, lesions or ulcers Psychiatry: Judgement and insight appear normal. Mood & affect appropriate.  Data Reviewed:    Labs: Basic Metabolic Panel: Recent Labs  Lab 09/06/20 1805 09/06/20 1805 09/08/20 0051 09/08/20 0051 09/09/20 0218 09/09/20 0218 09/10/20 1703 09/11/20 1124  NA 135  --  137  --  134*  --  136 137  K 4.2   < > 3.7   < > 3.6   < > 4.1 3.2*  CL 100  --  101  --  97*  --  100 101  CO2 26  --  27  --  26  --  24 25  GLUCOSE 136*  --  94  --  90  --  120* 92  BUN 31*  --  29*  --  33*  --  32* 28*  CREATININE 1.07  --  1.10  --  1.06  --  1.11 1.05   CALCIUM 8.5*  --  8.8*  --  8.6*  --  8.9 9.1  MG  --   --   --   --  1.9  --   --   --    < > = values in this interval not displayed.   GFR Estimated Creatinine Clearance: 65.7 mL/min (by C-G formula based on SCr of 1.05 mg/dL). Liver Function Tests: No results for  input(s): AST, ALT, ALKPHOS, BILITOT, PROT, ALBUMIN in the last 168 hours. No results for input(s): LIPASE, AMYLASE in the last 168 hours. No results for input(s): AMMONIA in the last 168 hours. Coagulation profile Recent Labs  Lab 09/06/20 1805 09/07/20 0811 09/08/20 0051 09/09/20 0218  INR 2.7* 2.0* 1.9* 1.6*   COVID-19 Labs  No results for input(s): DDIMER, FERRITIN, LDH, CRP in the last 72 hours.  Lab Results  Component Value Date   SARSCOV2NAA POSITIVE (A) 08/26/2020   [place] NEGATIVE 04/21/2020    CBC: Recent Labs  Lab 09/09/20 0218 09/10/20 1344 09/11/20 1124 09/12/20 0619 09/13/20 0743  WBC 18.0* 15.2* 16.4* 11.8* 11.6*  HGB 12.6* 12.3* 12.3* 10.8* 11.3*  HCT 40.1 38.9* 38.8* 34.5* 35.6*  MCV 80.5 81.6 82.0 81.2 81.1  PLT 209 190 220 205 240   Cardiac Enzymes: No results for input(s): CKTOTAL, CKMB, CKMBINDEX, TROPONINI in the last 168 hours. BNP (last 3 results) No results for input(s): PROBNP in the last 8760 hours. CBG: No results for input(s): GLUCAP in the last 168 hours. D-Dimer: No results for input(s): DDIMER in the last 72 hours. Hgb A1c: No results for input(s): HGBA1C in the last 72 hours. Lipid Profile: No results for input(s): CHOL, HDL, LDLCALC, TRIG, CHOLHDL, LDLDIRECT in the last 72 hours. Thyroid function studies: No results for input(s): TSH, T4TOTAL, T3FREE, THYROIDAB in the last 72 hours.  Invalid input(s): FREET3 Anemia work up: No results for input(s): VITAMINB12, FOLATE, FERRITIN, TIBC, IRON, RETICCTPCT in the last 72 hours. Sepsis Labs: Recent Labs  Lab 09/10/20 1344 09/11/20 1124 09/12/20 0619 09/13/20 0743  WBC 15.2* 16.4* 11.8* 11.6*    Microbiology Recent Results (from the past 240 hour(s))  MRSA PCR Screening     Status: None   Collection Time: 09/09/20  4:38 AM   Specimen: Nasal Mucosa; Nasopharyngeal  Result Value Ref Range Status   MRSA by PCR NEGATIVE NEGATIVE Final    Comment:        The GeneXpert MRSA Assay (FDA approved for NASAL specimens only), is one component of a comprehensive MRSA colonization surveillance program. It is not intended to diagnose MRSA infection nor to guide or monitor treatment for MRSA infections. Performed at [facility], [place] [address], [place], [place] [address]      Medications:   . albuterol  2 puff Inhalation TID  . apixaban  10 mg Oral BID   Followed by  . [START ON 09/17/2020] apixaban  5 mg Oral BID  . vitamin C  500 mg Oral Daily  . digoxin  0.125 mg Oral Daily  . fenofibrate  160 mg Oral Daily  . ferrous sulfate  325 mg Oral BID  . folic acid  1 mg Oral QPM  . furosemide  40 mg Oral Daily  . hydrocortisone  25 mg Rectal BID  . methylPREDNISolone (SOLU-MEDROL) injection  20 mg Intravenous Daily  . metoprolol succinate  25 mg Oral Daily  . oxymetazoline  1 spray Each Nare BID  . pantoprazole  40 mg Oral BID  . polyethylene glycol  17 g Oral BID  . potassium chloride  20 mEq Oral Daily  . pravastatin  40 mg Oral Daily  . saline  1 application Each Nare [id number]  . sertraline  150 mg Oral Q1400  . sodium chloride  1 spray Each Nare Q6H  . zinc sulfate  220 mg Oral Daily   Continuous Infusions: . sodium chloride 250 mL (09/13/20 0524)  LOS: 18 days   Charlynne Cousins  [facility]  09/13/2020, 8:40 AM

## 2020-09-14 LAB — CBC
HCT: 36.7 % — ABNORMAL LOW (ref 39.0–52.0)
Hemoglobin: 11.7 g/dL — ABNORMAL LOW (ref 13.0–17.0)
MCH: 25.9 pg — ABNORMAL LOW (ref 26.0–34.0)
MCHC: 31.9 g/dL (ref 30.0–36.0)
MCV: 81.2 fL (ref 80.0–100.0)
Platelets: 236 10*3/uL (ref 150–400)
RBC: 4.52 MIL/uL (ref 4.22–5.81)
RDW: 16.8 % — ABNORMAL HIGH (ref 11.5–15.5)
WBC: 12.7 10*3/uL — ABNORMAL HIGH (ref 4.0–10.5)
nRBC: 0 % (ref 0.0–0.2)

## 2020-09-14 LAB — BASIC METABOLIC PANEL
Anion gap: 9 (ref 5–15)
BUN: 29 mg/dL — ABNORMAL HIGH (ref 8–23)
CO2: 26 mmol/L (ref 22–32)
Calcium: 9.5 mg/dL (ref 8.9–10.3)
Chloride: 103 mmol/L (ref 98–111)
Creatinine, Ser: 1.26 mg/dL — ABNORMAL HIGH (ref 0.61–1.24)
GFR calc Af Amer: >60 mL/min (ref >60)
GFR calc non Af Amer: 57 mL/min — ABNORMAL LOW (ref >60)
Glucose, Bld: 85 mg/dL (ref 70–99)
Potassium: 4.4 mmol/L (ref 3.5–5.1)
Sodium: 138 mmol/L (ref 135–145)

## 2020-09-14 MED ORDER — METHYLPREDNISOLONE SODIUM SUCC 40 MG IJ SOLR
5.0000 mg | Freq: Every day | INTRAMUSCULAR | Status: AC
  Administered 2020-09-14: 5.2 mg via INTRAVENOUS

## 2020-09-14 NOTE — Progress Notes (Signed)
Physical Therapy Treatment Patient Details Name: [name] MRN: [id number] DOB: 12/02/1946 Today's Date: 09/14/2020    History of Present Illness MEDFORD STAHELI is a 74 y.o. male with medical history significant of nonischemic cardiomyopathy with ejection fraction of 25-30%, status post ICD placement, permanent A. fib/atrial flutter-on Coumadin, hypertension, hyperlipidemia, depression/anxiety, GERD, presents to emergency department with worsening shortness of breath and cough since 1 week. Tested pos for covid.     PT Comments    Pt admitted with above diagnosis. Pt was able to transfer from chair to bed with sats 85% with quick recovery once in bed.  Pt tolerated the transfer a lot better along with some exercises.  Overall did better today.  Pt currently with functional limitations due to balance and endurance deficits. Pt will benefit from skilled PT to increase their independence and safety with mobility to allow discharge to the venue listed below.     Follow Up Recommendations  SNF;Supervision/Assistance - 24 hour     Equipment Recommendations  None recommended by PT    Recommendations for Other Services       Precautions / Restrictions Precautions Precautions: Fall Precaution Comments: Heated High Flow Restrictions Weight Bearing Restrictions: No    Mobility  Bed Mobility Overal bed mobility: Needs Assistance Bed Mobility: Sit to Supine       Sit to supine: Min assist   General bed mobility comments: Min assistance to boost to [facility].  Transfers Overall transfer level: Needs assistance Equipment used: Rolling walker (2 wheeled)   Sit to Stand: Min guard Stand pivot transfers: Min assist       General transfer comment: Pt sitting in chair on arrival.  Pt was min guard A to steady and increased time needed. Pt was able to pivot to chair without physical assist. Pt tolerated well and sats down to 85% for a few seconds and then 93% with pt on 30% HF oxygen  therapy.   Ambulation/Gait Ambulation/Gait assistance: Min assist Gait Distance (Feet): 5 Feet Assistive device: Rolling walker (2 wheeled) Gait Pattern/deviations: Step-to pattern;Decreased stride length;Trunk flexed Gait velocity: decreased Gait velocity interpretation: <1.31 ft/sec, indicative of household ambulator General Gait Details: pt desat to 85% with mobility. Min guard A for safety. Very short step length   Stairs             Wheelchair Mobility    Modified Rankin (Stroke Patients Only)       Balance           Standing balance support: Bilateral upper extremity supported Standing balance-[name] Scale: Fair Standing balance comment: relies on UE support for balance on RW and external support                             Cognition Arousal/Alertness: Awake/alert Behavior During Therapy: WFL for tasks assessed/performed Overall Cognitive Status: Within Functional Limits for tasks assessed                                        Exercises General Exercises - Lower Extremity Ankle Circles/Pumps: AROM;Both;10 reps;Supine Long Arc Quad: AROM;Both;10 reps;Seated    General Comments        Pertinent Vitals/Pain Pain Assessment: Faces Faces Pain Scale: Hurts little more Pain Location: R hip and knee during flexion Pain Descriptors / Indicators: Discomfort;Grimacing;Guarding Pain Intervention(s): Limited activity within patient's tolerance;Monitored  during session;Repositioned    Home Living                      Prior Function            PT Goals (current goals can now be found in the care plan section) Progress towards PT goals: Progressing toward goals    Frequency    Min 3X/week      PT Plan Current plan remains appropriate    Co-evaluation              AM-PAC PT "6 Clicks" Mobility   Outcome Measure  Help needed turning from your back to your side while in a flat bed without using  bedrails?: None Help needed moving from lying on your back to sitting on the side of a flat bed without using bedrails?: A Little Help needed moving to and from a bed to a chair (including a wheelchair)?: A Little Help needed standing up from a chair using your arms (e.g., wheelchair or bedside chair)?: A Little Help needed to walk in hospital room?: A Lot Help needed climbing 3-5 steps with a railing? : Total 6 Click Score: 16    End of Session Equipment Utilized During Treatment: Oxygen;Gait belt Activity Tolerance: Patient limited by fatigue Patient left: with call bell/phone within reach;in bed;with bed alarm set Nurse Communication: Mobility status PT Visit Diagnosis: Unsteadiness on feet (R26.81);Muscle weakness (generalized) (M62.81);Dizziness and giddiness (R42)     Time: 1520-1600 PT Time Calculation (min) (ACUTE ONLY): 40 min  Charges:  $Gait Training: 8-22 mins $Therapeutic Exercise: 8-22 mins $Therapeutic Activity: 8-22 mins                     Ellyse Rotolo W,PT Acute Rehabilitation Services Pager:  [phone]  Office:  [place] 09/14/2020, 4:36 PM

## 2020-09-14 NOTE — Progress Notes (Signed)
Pt up in chair. No desat noted. Pt was able to maintain sats on current oxygen requirement. Pt is agreeable to stay in chair for 8hrs as encouraged by doctor and nurse.

## 2020-09-14 NOTE — Progress Notes (Signed)
Pt was placed in bed by physical therapy.

## 2020-09-14 NOTE — Progress Notes (Signed)
[facility] PROGRESS NOTE    Progress Note  MICHALL NOFFKE  GLO:[id number] DOB: 17-Nov-1946 DOA: 08/25/2020 PCP: [name], MD     Brief Narrative:   [name] is an 74 y.o. male past medical history significant for nonischemic cardiomyopathy with an EF of 25% status post AICD, permanent atrial fibrillation/atrial flutter on Coumadin who is fully vaccinated in March went to an out-of-state reunion several days prior to admission started developing shortness of breath at rest with cough nasal congestion loose stools and generalized weakness.  In the ED was found tachypneic hypoxic placed on 2 L of oxygen SARS-CoV-2 PCR was positive tropes 25 D-dimer 1.6 BNP of 425 creatinine at baseline chest x-ray showed bilateral diffuse groundglass opacities asymmetric.  Assessment/Plan:   Acute respiratory failure with hypoxia secondary to Pneumonia due to COVID-19 virus He remains on 15 L of heated high flow and FiO2 of 30% satting greater than 88%. Has completed his course of IV remdesivir and steroids. Continue encouraged patient to prone for at least 16 hours a day if not prone out of bed to chair, use incentive spirometry as much as possible. Patient remains tenuous respiratory wise try to keep saturations greater than 88%. He needs to get out of the bed into the chair and he needs to continue to work with physical therapy. He will come off exhalation on 09/15/2020. We will need to wean him to room air.  Acute on chronic systolic heart failure: He continues to be negative, but totally 5 L, appears euvolemic on physical exam. Creatinine and electrolytes are stable continue to monitor intermittently.  New bilateral lower extremity DVT: CT angio of the chest did not showed large PE but was unable to evaluate subsegmental PE due to contrast timing. Despite being on Coumadin and supratherapeutic INR he did develop DVT.  He was started on IV heparin now transition to  Eliquis.  Permanent atrial fibrillation: Coumadin has been discontinued he was started on IV heparin now transition to Eliquis which she will continue as an outpatient, rate controlled continue metoprolol.  Essential hypertension: Continue metoprolol and Lasix. Blood pressure seems to be well controlled.  Chronic kidney disease stage IIIa: Appears to be at baseline.  Coronary artery disease status post PCI: Continue statins, metoprolol and aspirin.  Hyperlipidemia: Continue fenofibrate and statins.  Small amount of black stool: Resolved. His blood pressure likely due to hemorrhoids, he had a colonoscopy 08/31/2016 that showed thrombosed external hemorrhoids, no further signs of bleeding his hemoglobin has remained stable.  Acute confusional state: Continue Haldol IV as needed.  Oral thrush: He completed 7-day course of IV Diflucan.   DVT prophylaxis: coumadin Family Communication:wife Status is: Inpatient  Remains inpatient appropriate because:Persistent severe electrolyte disturbances   Dispo: The patient is from: Home              Anticipated d/c is to: Home              Anticipated d/c date is: > 3 days              Patient currently is not medically stable to d/c. once he is on 4L of oxygen.  He will probably need to go to CIR or skilled nursing facility.  Physical therapy to continue to work with him.  Code Status:     Code Status Orders  (From admission, onward)         Start     Ordered   08/26/20 1045  Full  code  Continuous        08/26/20 1046        Code Status History    Date Active Date Inactive Code Status Order ID Comments User Context   04/24/2020 1531 04/24/2020 2124 Full Code [id number]  [name], MD Inpatient   09/30/2017 0116 10/02/2017 1531 Full Code [id number]  [name], MD ED   04/10/2017 1748 04/14/2017 1428 Full Code [id number]  [name], MD Inpatient   01/25/2017 1411 01/25/2017 2220 Full Code [id number]  [name], MD  Inpatient   03/02/2016 1628 03/16/2016 2023 Full Code [id number]  [name] Inpatient   02/18/2016 1408 03/02/2016 1613 Full Code [id number]  [name], MD ED   Advance Care Planning Activity        IV Access:    Peripheral IV   Procedures and diagnostic studies:   No results found.   Medical Consultants:    None.  Anti-Infectives:   none  Subjective:    [name] no new complaints this morning.  Objective:    Vitals:   09/13/20 2152 09/14/20 0147 09/14/20 0535 09/14/20 0538  BP:    (!) 122/53  Pulse: 70 70  68  Resp: 18 16  18   Temp:    (!) 97.5 F (36.4 C)  TempSrc:    Oral  SpO2: 96% 97%  92%  Weight:   82.4 kg   Height:       SpO2: 92 % O2 Flow Rate (L/min): (S) 15 L/min FiO2 (%): (S) 30 %   Intake/Output Summary (Last 24 hours) at 09/14/2020 0839 Last data filed at 09/14/2020 0549 Gross per 24 hour  Intake 880 ml  Output 1701 ml  Net -821 ml   Filed Weights   09/12/20 0604 09/13/20 0536 09/14/20 0535  Weight: 83.8 kg 81.5 kg 82.4 kg    Exam: General exam: In no acute distress. Respiratory system: Good air movement and clear to auscultation Cardiovascular system: S1 & S2 heard, RRR. No JVD. Gastrointestinal system: Abdomen is nondistended, soft and nontender.  Extremities: No pedal edema. Skin: No rashes, lesions or ulcers Psychiatry: Judgement and insight appear normal. Mood & affect appropriate.  Data Reviewed:    Labs: Basic Metabolic Panel: Recent Labs  Lab 09/08/20 0051 09/08/20 0051 09/09/20 0218 09/09/20 0218 09/10/20 1703 09/10/20 1703 09/11/20 1124 09/14/20 0444  NA 137  --  134*  --  136  --  137 138  K 3.7   < > 3.6   < > 4.1   < > 3.2* 4.4  CL 101  --  97*  --  100  --  101 103  CO2 27  --  26  --  24  --  25 26  GLUCOSE 94  --  90  --  120*  --  92 85  BUN 29*  --  33*  --  32*  --  28* 29*  CREATININE 1.10  --  1.06  --  1.11  --  1.05 1.26*  CALCIUM 8.8*  --  8.6*  --  8.9  --  9.1 9.5  MG   --   --  1.9  --   --   --   --   --    < > = values in this interval not displayed.   GFR Estimated Creatinine Clearance: 54.7 mL/min (A) (by C-G formula based on SCr of 1.26 mg/dL (H)). Liver Function Tests: No results for input(s):  AST, ALT, ALKPHOS, BILITOT, PROT, ALBUMIN in the last 168 hours. No results for input(s): LIPASE, AMYLASE in the last 168 hours. No results for input(s): AMMONIA in the last 168 hours. Coagulation profile Recent Labs  Lab 09/08/20 0051 09/09/20 0218  INR 1.9* 1.6*   COVID-19 Labs  No results for input(s): DDIMER, FERRITIN, LDH, CRP in the last 72 hours.  Lab Results  Component Value Date   SARSCOV2NAA POSITIVE (A) 08/26/2020   [place] NEGATIVE 04/21/2020    CBC: Recent Labs  Lab 09/10/20 1344 09/11/20 1124 09/12/20 0619 09/13/20 0743 09/14/20 0444  WBC 15.2* 16.4* 11.8* 11.6* 12.7*  HGB 12.3* 12.3* 10.8* 11.3* 11.7*  HCT 38.9* 38.8* 34.5* 35.6* 36.7*  MCV 81.6 82.0 81.2 81.1 81.2  PLT 190 220 205 240 236   Cardiac Enzymes: No results for input(s): CKTOTAL, CKMB, CKMBINDEX, TROPONINI in the last 168 hours. BNP (last 3 results) No results for input(s): PROBNP in the last 8760 hours. CBG: No results for input(s): GLUCAP in the last 168 hours. D-Dimer: No results for input(s): DDIMER in the last 72 hours. Hgb A1c: No results for input(s): HGBA1C in the last 72 hours. Lipid Profile: No results for input(s): CHOL, HDL, LDLCALC, TRIG, CHOLHDL, LDLDIRECT in the last 72 hours. Thyroid function studies: No results for input(s): TSH, T4TOTAL, T3FREE, THYROIDAB in the last 72 hours.  Invalid input(s): FREET3 Anemia work up: No results for input(s): VITAMINB12, FOLATE, FERRITIN, TIBC, IRON, RETICCTPCT in the last 72 hours. Sepsis Labs: Recent Labs  Lab 09/11/20 1124 09/12/20 0619 09/13/20 0743 09/14/20 0444  WBC 16.4* 11.8* 11.6* 12.7*   Microbiology Recent Results (from the past 240 hour(s))  MRSA PCR Screening     Status:  None   Collection Time: 09/09/20  4:38 AM   Specimen: Nasal Mucosa; Nasopharyngeal  Result Value Ref Range Status   MRSA by PCR NEGATIVE NEGATIVE Final    Comment:        The GeneXpert MRSA Assay (FDA approved for NASAL specimens only), is one component of a comprehensive MRSA colonization surveillance program. It is not intended to diagnose MRSA infection nor to guide or monitor treatment for MRSA infections. Performed at [facility], [place] [address] [place], [place] [address]      Medications:    albuterol  2 puff Inhalation TID   apixaban  10 mg Oral BID   Followed by   Derrill Memo ON 09/17/2020] apixaban  5 mg Oral BID   vitamin C  500 mg Oral Daily   digoxin  0.125 mg Oral Daily   fenofibrate  160 mg Oral Daily   ferrous sulfate  325 mg Oral BID   folic acid  1 mg Oral QPM   furosemide  40 mg Oral Daily   hydrocortisone  25 mg Rectal BID   methylPREDNISolone (SOLU-MEDROL) injection  10 mg Intravenous Daily   metoprolol succinate  25 mg Oral Daily   oxymetazoline  1 spray Each Nare BID   pantoprazole  40 mg Oral BID   polyethylene glycol  17 g Oral BID   potassium chloride  20 mEq Oral Daily   pravastatin  40 mg Oral Daily   saline  1 application Each Nare G8J   sertraline  150 mg Oral Q1400   sodium chloride  1 spray Each Nare Q6H   zinc sulfate  220 mg Oral Daily   Continuous Infusions:  sodium chloride 250 mL (09/13/20 0524)      LOS: 19 days  Charlynne Cousins  [facility]  09/14/2020, 8:39 AM

## 2020-09-15 LAB — BASIC METABOLIC PANEL
Anion gap: 11 (ref 5–15)
BUN: 34 mg/dL — ABNORMAL HIGH (ref 8–23)
CO2: 27 mmol/L (ref 22–32)
Calcium: 9.8 mg/dL (ref 8.9–10.3)
Chloride: 98 mmol/L (ref 98–111)
Creatinine, Ser: 1.47 mg/dL — ABNORMAL HIGH (ref 0.61–1.24)
GFR calc Af Amer: 54 mL/min — ABNORMAL LOW (ref >60)
GFR calc non Af Amer: 47 mL/min — ABNORMAL LOW (ref >60)
Glucose, Bld: 92 mg/dL (ref 70–99)
Potassium: 4.1 mmol/L (ref 3.5–5.1)
Sodium: 136 mmol/L (ref 135–145)

## 2020-09-15 NOTE — Progress Notes (Signed)
PROGRESS NOTE    PER BEAGLEY  ZOX:[id number] DOB: Jun 15, 1946 DOA: 08/25/2020 PCP: [name], MD   Chief Complaint  Patient presents with  . Shortness of Breath   Brief Narrative: 74 y.o. male past medical history significant for nonischemic cardiomyopathy with an EF of 25% status post AICD, permanent atrial fibrillation/atrial flutter on Coumadin who is fully vaccinated in March went [place] several days prior to admission started developing shortness of breath at rest with cough nasal congestion loose stools and generalized weakness.  In the ED was found tachypneic hypoxic placed on 2 L of oxygen SARS-CoV-2 PCR was positive tropes 25 D-dimer 1.6 BNP of 425 creatinine at baseline chest x-ray showed bilateral diffuse groundglass opacities asymmetric. Patient was admitted underwent extensive hospitalization for acute hypoxic respiratory failure with Covid 19 pneumonia, acute on chronic systolic CHF, bilateral lower extremity DVT, permanent atrial fibrillation CKD stage III, CAD, oral thrush, acute confusional state. Patient is slowly improving 9/28 on 10 l HFNC   Subjective:  On bedside chair, on 10 L high flow nasal cannula, work with physical therapy this morning. No acute events overnight. Uptrending Creat 1.4 from 1.2. Patient reports he he sleeps with semiupright position, denies any worsening leg swelling.  Reports his breathing is overall same like yesterday.  Assessment & Plan:  Acute hypoxic respiratory failure due to pneumonia due to COVID-19 virus/COVID-19 pneumonia: On high flow nasal cannula but down to 10 L.  Completed remdesivir and steroid course.  Still with tenuous respiratory status, keep on high flow nasal cannula for comfort to keep saturation of 88%, continue to work with PT OT with increase activity ambulation, incentive spirometry.  Continue to wean down oxygen as tolerated. COVID-19 Labs Lab Results  Component Value Date   [place] (A) 08/26/2020   [place] NEGATIVE 04/21/2020   Acute on chronic systolic CHF s/p ICD: total neg neg 3.8 l, wt on admission 194-211 lb> at 181> 181 lb, has lsot wt. Appears euvolemic.Last E cho 2018:LV EF: 25% - 30%.Given slightly worsening creatinine I will hold off on further Lasix, bmp in am. Cont digoxin. cont Toprol 25 mg.  Monitor intake output and daily weight.  AKI on CKD stage IIIa: Creatinine slightly uptrending.  Multifactorial, with CHF, Covid pneumonia as well as on diuresis. Hold off on further Lasix monitor BMP in a.m. Recent Labs  Lab 09/09/20 0218 09/10/20 1703 09/11/20 1124 09/14/20 0444 09/15/20 0502  BUN 33* 32* 28* 29* 34*  CREATININE 1.06 1.11 1.05 1.26* 1.47*   Permanent atrial fibrillation: v paced. On Toprol, Eliquis, off Coumadin  New bilateral lower extremity DVT in the setting of Covid 19 infection despite being on Coumadin and supratherapeutic INR and has been switched to Eliquis.  CT angio did not show large PE.  Essential hypertension: BP is controlled continue Toprol.  HLD on pravastatin  CAD S/P LAD PCI- 2000: Continue Toprol, statin and Eliquis.  Small amount of blackish stool: resolved suspected to be due to hemorrhoids, had colonoscopy 08/31/2016 that showed thrombosed external hemorrhoids no further signs of bleeding hemoglobin is stable.  Monitor Recent Labs  Lab 09/10/20 1344 09/11/20 1124 09/12/20 0619 09/13/20 0743 09/14/20 0444  HGB 12.3* 12.3* 10.8* 11.3* 11.7*  HCT 38.9* 38.8* 34.5* 35.6* 36.7*   Acute confusional state appears to be alert awake oriented continue supportive measures, fall precaution.  As needed medication for agitation  Oral thrush:completed 7 days of Diflucan  DVT prophylaxis: SCDs Start: 08/26/20 1045 Eliquis Code  Status:   Code Status: Full Code  Family Communication: plan of care discussed with patient at bedside.  I was asked to leave a voice message for his wife by the patient with medical  details- I called her cell phone and left a brief message. Will update more once able to talk.  Status is: Inpatient Remains inpatient appropriate because:Ongoing need for high flow nasal cannula  Dispo: The patient is from: Home              Anticipated d/c is to: SNF              Anticipated d/c date is: 3 days              Patient currently is not medically stable to d/c.  Nutrition: Diet Order            Diet Heart Room service appropriate? Yes; Fluid consistency: Thin; Fluid restriction: 1500 mL Fluid  Diet effective now                 Consultants:see note  Procedures:see note Microbiology:see note Blood Culture    Component Value Date/Time   SDES BLOOD LEFT FOREARM 08/27/2020 0456   SPECREQUEST  08/27/2020 0456    BOTTLES DRAWN AEROBIC AND ANAEROBIC Blood Culture results may not be optimal due to an inadequate volume of blood received in culture bottles   CULT  08/27/2020 0456    NO GROWTH 5 DAYS Performed at [facility], [place] [address], [place], [place] [address]    REPTSTATUS 09/01/2020 FINAL 08/27/2020 0456    Other culture-see note  Medications: Scheduled Meds: . albuterol  2 puff Inhalation TID  . apixaban  10 mg Oral BID   Followed by  . [START ON 09/17/2020] apixaban  5 mg Oral BID  . vitamin C  500 mg Oral Daily  . digoxin  0.125 mg Oral Daily  . fenofibrate  160 mg Oral Daily  . ferrous sulfate  325 mg Oral BID  . folic acid  1 mg Oral QPM  . furosemide  40 mg Oral Daily  . hydrocortisone  25 mg Rectal BID  . metoprolol succinate  25 mg Oral Daily  . oxymetazoline  1 spray Each Nare BID  . pantoprazole  40 mg Oral BID  . potassium chloride  20 mEq Oral Daily  . pravastatin  40 mg Oral Daily  . saline  1 application Each Nare H2Z  . sertraline  150 mg Oral Q1400  . sodium chloride  1 spray Each Nare Q6H  . zinc sulfate  220 mg Oral Daily   Continuous Infusions: . sodium chloride 250 mL (09/13/20 0524)     Antimicrobials: Anti-infectives (From admission, onward)   Start     Dose/Rate Route Frequency Ordered Stop   09/08/20 1230  ceFEPIme (MAXIPIME) 2 g in sodium chloride 0.9 % 100 mL IVPB        2 g 200 mL/hr over 30 Minutes Intravenous Every 8 hours 09/08/20 1135 09/13/20 0554   09/03/20 1000  fluconazole (DIFLUCAN) IVPB 100 mg        100 mg 50 mL/hr over 60 Minutes Intravenous Every 24 hours 09/02/20 0822 09/08/20 1029   09/02/20 1000  fluconazole (DIFLUCAN) IVPB 200 mg        200 mg 100 mL/hr over 60 Minutes Intravenous  Once 09/02/20 0816 09/02/20 1342   09/02/20 1000  fluconazole (DIFLUCAN) IVPB 100 mg  Status:  Discontinued  100 mg 50 mL/hr over 60 Minutes Intravenous Every 24 hours 09/02/20 0816 09/02/20 0822   08/27/20 1000  remdesivir 100 mg in sodium chloride 0.9 % 100 mL IVPB       "Followed by" Linked Group Details   100 mg 200 mL/hr over 30 Minutes Intravenous Daily 08/26/20 1012 08/30/20 0957   08/27/20 1000  remdesivir 100 mg in sodium chloride 0.9 % 100 mL IVPB  Status:  Discontinued       "Followed by" Linked Group Details   100 mg 200 mL/hr over 30 Minutes Intravenous Daily 08/26/20 1046 08/26/20 1101   08/26/20 1200  remdesivir 200 mg in sodium chloride 0.9% 250 mL IVPB       "Followed by" Linked Group Details   200 mg 580 mL/hr over 30 Minutes Intravenous Once 08/26/20 1012 08/26/20 1548   08/26/20 1100  remdesivir 200 mg in sodium chloride 0.9% 250 mL IVPB  Status:  Discontinued       "Followed by" Linked Group Details   200 mg 580 mL/hr over 30 Minutes Intravenous Once 08/26/20 1046 08/26/20 1101     Objective: Vitals: Today's Vitals   09/15/20 0311 09/15/20 0617 09/15/20 0845 09/15/20 0904  BP: 125/68     Pulse: 69     Resp:    17  Temp: 98.2 F (36.8 C)     TempSrc: Oral     SpO2:  94% 95% 97%  Weight:      Height:      PainSc:        Intake/Output Summary (Last 24 hours) at 09/15/2020 1116 Last data filed at 09/15/2020 0859 Gross per  24 hour  Intake 820 ml  Output 700 ml  Net 120 ml   Filed Weights   09/13/20 0536 09/14/20 0535 09/15/20 0300  Weight: 81.5 kg 82.4 kg 82.3 kg   Weight change: -0.1 kg  Intake/Output from previous day: 09/27 0701 - 09/28 0700 In: 47 [P.O.:820] Out: 700 [Urine:700] Intake/Output this shift: Total I/O In: 220 [P.O.:220] Out: -   Examination: General exam: AAOx3, weak old for age, on nasal cannula, bedside chair HEENT:Oral mucosa moist, Ear/Nose WNL grossly,dentition normal. Respiratory system: bilaterally clear, diminished at the base,no wheezing or crackles,no use of accessory muscle, non tender. Cardiovascular system: S1 & S2 +, regular, No JVD. Gastrointestinal system: Abdomen soft, NT,ND, BS+. Nervous System:Alert, awake, moving extremities and grossly nonfocal Extremities: Chronic venous stasis on legs, distal peripheral pulses palpable.  Skin: No rashes,no icterus. MSK: Normal muscle bulk,tone, power  Data Reviewed: I have personally reviewed following labs and imaging studies CBC: Recent Labs  Lab 09/10/20 1344 09/11/20 1124 09/12/20 0619 09/13/20 0743 09/14/20 0444  WBC 15.2* 16.4* 11.8* 11.6* 12.7*  HGB 12.3* 12.3* 10.8* 11.3* 11.7*  HCT 38.9* 38.8* 34.5* 35.6* 36.7*  MCV 81.6 82.0 81.2 81.1 81.2  PLT 190 220 205 240 629   Basic Metabolic Panel: Recent Labs  Lab 09/09/20 0218 09/10/20 1703 09/11/20 1124 09/14/20 0444 09/15/20 0502  NA 134* 136 137 138 136  K 3.6 4.1 3.2* 4.4 4.1  CL 97* 100 101 103 98  CO2 26 24 25 26 27   GLUCOSE 90 120* 92 85 92  BUN 33* 32* 28* 29* 34*  CREATININE 1.06 1.11 1.05 1.26* 1.47*  CALCIUM 8.6* 8.9 9.1 9.5 9.8  MG 1.9  --   --   --   --    GFR: Estimated Creatinine Clearance: 46.9 mL/min (A) (by C-G formula based on SCr of  1.47 mg/dL (H)). Liver Function Tests: No results for input(s): AST, ALT, ALKPHOS, BILITOT, PROT, ALBUMIN in the last 168 hours. No results for input(s): LIPASE, AMYLASE in the last 168  hours. No results for input(s): AMMONIA in the last 168 hours. Coagulation Profile: Recent Labs  Lab 09/09/20 0218  INR 1.6*   Cardiac Enzymes: No results for input(s): CKTOTAL, CKMB, CKMBINDEX, TROPONINI in the last 168 hours. BNP (last 3 results) No results for input(s): PROBNP in the last 8760 hours. HbA1C: No results for input(s): HGBA1C in the last 72 hours. CBG: No results for input(s): GLUCAP in the last 168 hours. Lipid Profile: No results for input(s): CHOL, HDL, LDLCALC, TRIG, CHOLHDL, LDLDIRECT in the last 72 hours. Thyroid Function Tests: No results for input(s): TSH, T4TOTAL, FREET4, T3FREE, THYROIDAB in the last 72 hours. Anemia Panel: No results for input(s): VITAMINB12, FOLATE, FERRITIN, TIBC, IRON, RETICCTPCT in the last 72 hours. Sepsis Labs: No results for input(s): PROCALCITON, LATICACIDVEN in the last 168 hours.  Recent Results (from the past 240 hour(s))  MRSA PCR Screening     Status: None   Collection Time: 09/09/20  4:38 AM   Specimen: Nasal Mucosa; Nasopharyngeal  Result Value Ref Range Status   MRSA by PCR NEGATIVE NEGATIVE Final    Comment:        The GeneXpert MRSA Assay (FDA approved for NASAL specimens only), is one component of a comprehensive MRSA colonization surveillance program. It is not intended to diagnose MRSA infection nor to guide or monitor treatment for MRSA infections. Performed at [facility], [place] [address], [place], [place] [address]      Radiology Studies: No results found.   LOS: 20 days   Antonieta Pert, MD [facility]  09/15/2020, 11:16 AM

## 2020-09-15 NOTE — Progress Notes (Addendum)
Occupational Therapy Treatment Patient Details Name: [name] MRN: [id number] DOB: 07/22/1946 Today's Date: 09/15/2020    History of present illness BRENNIN DURFEE is a 74 y.o. male with medical history significant of nonischemic cardiomyopathy with ejection fraction of 25-30%, status post ICD placement, permanent A. fib/atrial flutter-on Coumadin, hypertension, hyperlipidemia, depression/anxiety, GERD, presents to emergency department with worsening shortness of breath and cough since 1 week. Tested pos for covid.    OT comments  Patient found up in recliner this date.  Stated he had been up since 9:30am.  OT worked on [employer] and chair push ups for generalized strength, transfer status and activity tolerance.  High flow O2 continues to be dropped.  9/28 on 10 L HFNC.  Sats around 88% for transfer.  Increased time to recover above 91%.  OT continues to educate patient on energy conservation, rest breaks, decreased talking and breathing techniques.  Decreased activity tolerance, generalized weakness and balance deficits continue to impact ADL and transfer status.  OT will continue to follow in the acute setting.  Question of SNF versus home with [place] and 24 hour assist as needed.   Follow Up Recommendations  SNF    Equipment Recommendations  3 in 1 bedside commode;Tub/shower seat;Wheelchair cushion (measurements OT);Wheelchair (measurements OT)    Recommendations for Other Services      Precautions / Restrictions Precautions Precautions: Fall Precaution Comments: Heated High Flow Restrictions Weight Bearing Restrictions: No       Mobility Bed Mobility Overal bed mobility: Needs Assistance Bed Mobility: Sit to Supine       Sit to supine: Min assist   General bed mobility comments: Min assistance to boost to [facility].  Transfers Overall transfer level: Needs assistance Equipment used: Rolling walker (2 wheeled)   Sit to Stand: Min guard Stand pivot transfers: Min assist        General transfer comment: 9/28 on 10 L HFNC.  Sats around 88% for transfer.  Increased time to recover above 91%                                                    Exercises Other Exercises Other Exercises: supported sitting chest press 2 sets and 10 reps. Other Exercises: supported sitting lateral raises 2 sets and 10 reps. Other Exercises: chair push ups. 4 sets of 2 reps with therapeutic rest breals.    Prior Functioning/Environment              Frequency  Min 2X/week        Progress Toward Goals  OT Goals(current goals can now be found in the care plan section)  Progress towards OT goals: Progressing toward goals  Acute Rehab OT Goals Patient Stated Goal: work on controlling my breathing. OT Goal Formulation: With patient Time For Goal Achievement: 09/24/20 Potential to Achieve Goals: Good  Plan Discharge plan remains appropriate    Co-evaluation                 AM-PAC OT "6 Clicks" Daily Activity     Outcome Measure   Help from another person eating meals?: None Help from another person taking care of personal grooming?: A Little Help from another person toileting, which includes using toliet, bedpan, or urinal?: A Little Help from another person bathing (including washing, rinsing, drying)?: A Lot Help from another  person to put on and taking off regular upper body clothing?: A Little Help from another person to put on and taking off regular lower body clothing?: A Lot 6 Click Score: 17    End of Session Equipment Utilized During Treatment: Oxygen;Rolling walker  OT Visit Diagnosis: Unsteadiness on feet (R26.81);Muscle weakness (generalized) (M62.81)   Activity Tolerance No increased pain;Patient limited by fatigue   Patient Left in bed;with call bell/phone within reach;with bed alarm set   Nurse Communication Mobility status        Time: 0981-1914 OT Time Calculation (min): 33 min  Charges: OT General  Charges $OT Visit: 1 Visit OT Treatments $Therapeutic Activity: 23-37 mins  09/15/2020  Rich, OTR/L  Acute Rehabilitation Services  Office:  [phone]    NOWELL SITES 09/15/2020, 11:55 AM

## 2020-09-16 LAB — BASIC METABOLIC PANEL
Anion gap: 13 (ref 5–15)
BUN: 35 mg/dL — ABNORMAL HIGH (ref 8–23)
CO2: 28 mmol/L (ref 22–32)
Calcium: 10 mg/dL (ref 8.9–10.3)
Chloride: 96 mmol/L — ABNORMAL LOW (ref 98–111)
Creatinine, Ser: 1.44 mg/dL — ABNORMAL HIGH (ref 0.61–1.24)
GFR calc Af Amer: 56 mL/min — ABNORMAL LOW (ref >60)
GFR calc non Af Amer: 48 mL/min — ABNORMAL LOW (ref >60)
Glucose, Bld: 81 mg/dL (ref 70–99)
Potassium: 5.1 mmol/L (ref 3.5–5.1)
Sodium: 137 mmol/L (ref 135–145)

## 2020-09-16 LAB — CBC
HCT: 38.7 % — ABNORMAL LOW (ref 39.0–52.0)
Hemoglobin: 12.4 g/dL — ABNORMAL LOW (ref 13.0–17.0)
MCH: 26.5 pg (ref 26.0–34.0)
MCHC: 32 g/dL (ref 30.0–36.0)
MCV: 82.7 fL (ref 80.0–100.0)
Platelets: 355 10*3/uL (ref 150–400)
RBC: 4.68 MIL/uL (ref 4.22–5.81)
RDW: 17.2 % — ABNORMAL HIGH (ref 11.5–15.5)
WBC: 13.2 10*3/uL — ABNORMAL HIGH (ref 4.0–10.5)
nRBC: 0 % (ref 0.0–0.2)

## 2020-09-16 NOTE — Progress Notes (Signed)
Inpatient Rehab Admissions Coordinator Note:   Per updated therapy recommendations, pt was screened for CIR candidacy by Shann Medal, PT, DPT.  At this time note that pt still requiring significant amount of supplemental O2 with desaturations below 80%.  Will not place an order for CIR Consult at this time, but will follow from a distance for tolerance.  Please contact me with questions.   Shann Medal, PT, DPT [phone] 09/16/20 4:18 PM

## 2020-09-16 NOTE — Progress Notes (Addendum)
Physical Therapy Treatment Patient Details Name: [name] MRN: [id number] DOB: Apr 22, 1946 Today's Date: 09/16/2020    History of Present Illness [name] is a 74 y.o. male with medical history significant of nonischemic cardiomyopathy with ejection fraction of 25-30%, status post ICD placement, permanent A. fib/atrial flutter-on Coumadin, hypertension, hyperlipidemia, depression/anxiety, GERD, presents to emergency department with worsening shortness of breath and cough since 1 week. Tested pos for covid.     PT Comments    Pt admitted with above diagnosis. Pt was able to stand and pivot to the recliner from the bed with min guard assist to min assist for safety with desaturation to 79%.  Rested and then performed exercises in standing with 2 rest breaks with sats down to 81% with quick recovery once sitting.  Pt progressing well overall and was very excited that his wife was able to come see him today.  Discussed pts progress with wife and she is going to be with pt at home 24 hours.  REcommend CIR as pt is outside of precaution window and would benefit from CIR to ultimately return home.  Pt currently with functional limitations due to balance and endurance deficits. Pt will benefit from skilled PT to increase their independence and safety with mobility to allow discharge to the venue listed below.     Follow Up Recommendations  Supervision/Assistance - 24 hour;CIR     Equipment Recommendations  Other (comment) (TBA)    Recommendations for Other Services Rehab consult     Precautions / Restrictions Precautions Precautions: Fall Precaution Comments: 100% NRB Restrictions Weight Bearing Restrictions: No    Mobility  Bed Mobility Overal bed mobility: Needs Assistance Bed Mobility: Supine to Sit     Supine to sit: Min guard;HOB elevated     General bed mobility comments: Came to EOB without physical assist with use of rail  Transfers Overall transfer level: Needs  assistance Equipment used: Rolling walker (2 wheeled) Transfers: Sit to/from [facility] Sit to Stand: Min guard Stand pivot transfers: Min assist       General transfer comment: Pt on 100% NRB mask.   Sats around to 79% for transfer.  Recovery time shorter than 20 seconds to recover above 91%.  Pt stands and pivots wtih use of RW and was steady overall. Steady descent into chair as well. Cues for hand placement needed at times.   Ambulation/Gait                 Stairs             Wheelchair Mobility    Modified Rankin (Stroke Patients Only)       Balance Overall balance assessment: Needs assistance Sitting-balance support: Feet supported;No upper extremity supported Sitting balance-[name] Scale: Fair     Standing balance support: Bilateral upper extremity supported Standing balance-[name] Scale: Fair Standing balance comment: relies on UE support for balance on RW and external support                             Cognition Arousal/Alertness: Awake/alert Behavior During Therapy: WFL for tasks assessed/performed Overall Cognitive Status: Within Functional Limits for tasks assessed                                        Exercises General Exercises - Lower Extremity Straight Leg Raises: AROM;Both;10  reps;Supine Hip Flexion/Marching: AROM;Both;10 reps;Standing Mini-Sqauts: AROM;Both;10 reps;Standing    General Comments        Pertinent Vitals/Pain Pain Assessment: Faces Faces Pain Scale: Hurts little more Pain Location: R hip and knee during flexion Pain Descriptors / Indicators: Discomfort;Grimacing;Guarding Pain Intervention(s): Limited activity within patient's tolerance;Monitored during session;Repositioned    Home Living                      Prior Function            PT Goals (current goals can now be found in the care plan section) Acute Rehab PT Goals Patient Stated Goal: work on  controlling my breathing. Progress towards PT goals: Progressing toward goals    Frequency    Min 3X/week      PT Plan Discharge plan needs to be updated    Co-evaluation              AM-PAC PT "6 Clicks" Mobility   Outcome Measure  Help needed turning from your back to your side while in a flat bed without using bedrails?: None Help needed moving from lying on your back to sitting on the side of a flat bed without using bedrails?: A Little Help needed moving to and from a bed to a chair (including a wheelchair)?: A Little Help needed standing up from a chair using your arms (e.g., wheelchair or bedside chair)?: A Little Help needed to walk in hospital room?: A Lot Help needed climbing 3-5 steps with a railing? : Total 6 Click Score: 16    End of Session Equipment Utilized During Treatment: Oxygen;Gait belt Activity Tolerance: Patient limited by fatigue Patient left: with call bell/phone within reach;in chair;with chair alarm set;with family/visitor present (wife came in during treatment) Nurse Communication: Mobility status PT Visit Diagnosis: Unsteadiness on feet (R26.81);Muscle weakness (generalized) (M62.81);Dizziness and giddiness (R42)     Time: [id number] PT Time Calculation (min) (ACUTE ONLY): 42 min  Charges:  $Therapeutic Exercise: 8-22 mins $Therapeutic Activity: 23-37 mins                     Hannan Hutmacher W,PT Acute Rehabilitation Services Pager:  [phone]  Office:  [place] 09/16/2020, 12:52 PM

## 2020-09-16 NOTE — Progress Notes (Signed)
PROGRESS NOTE    [name]  ZWC:[id number] DOB: 06/23/1946 DOA: 08/25/2020 PCP: [name], MD   Chief Complaint  Patient presents with  . Shortness of Breath   Brief Narrative: 74 y.o. male past medical history significant for nonischemic cardiomyopathy with an EF of 25% status post AICD, permanent atrial fibrillation/atrial flutter on Coumadin who is fully vaccinated in March went [place] an [place] several days prior to admission started developing shortness of breath at rest with cough nasal congestion loose stools and generalized weakness.  In the ED was found tachypneic hypoxic placed on 2 L of oxygen SARS-CoV-2 PCR was positive tropes 25 D-dimer 1.6 BNP of 425 creatinine at baseline chest x-ray showed bilateral diffuse groundglass opacities asymmetric. Patient was admitted underwent extensive hospitalization for acute hypoxic respiratory failure with Covid 19 pneumonia, acute on chronic systolic CHF, bilateral lower extremity DVT, permanent atrial fibrillation CKD stage III, CAD, oral thrush, acute confusional state. Patient is slowly improving 9/28 o2 down to 10 l HFNC , creat slightly up at 1.4- lasix on hold  Subjective: Seen on the bed.  He reports he feels better today.  He is on 10 L nasal cannula high flow this morning. Afebrile overnight.  Blood pressure stable.   Uptrending Creat 1.2>1.4>1.4  Assessment & Plan:  Acute hypoxic respiratory failure due to pneumonia due to COVID-19 virus/COVID-19 pneumonia: High flow nasal down was weaned down to 10 L, was on 15 l 9/27. He completed remdesivir and steroid course.  Continue to maintain pulse ox at least 88% and wean down oxygen as tolerated, continue PT OT, ambulation, incentive spirometry. Coming off airborne precaution as 21 days since Covid positive COVID-19 Labs Lab Results  Component Value Date   [facility] (A) 08/26/2020   [place] NEGATIVE 04/21/2020   Acute on chronic systolic CHF s/p  ICD: total neg neg 3. l, wt on admission 194-211 lb> down to181> 179.  And has lost weight.Last E cho 2018:LV EF: 25% - 30%.Lasix held 9/20 due to slightly worsening creatinine, monitoring renal function-overall stable, will continue to hold Lasix.  Continue on digoxin, Toprol 25 mg.  Monitor intake output and daily weight.  AKI on CKD stage IIIa: Multifactorial, with CHF, Covid pneumonia as well as on diuresis.  Holding Lasix, monitor renal function. Recent Labs  Lab 09/10/20 1703 09/11/20 1124 09/14/20 0444 09/15/20 0502 09/16/20 1036  BUN 32* 28* 29* 34* 35*  CREATININE 1.11 1.05 1.26* 1.47* 1.44*   Permanent atrial fibrillation: v paced. On Toprol, Eliquis, off Coumadin  New bilateral lower extremity DVT in the setting of Covid 19 infection despite being on Coumadin and supratherapeutic INR-which is likely falsely elevated INR.  After discussing with hematology he was switched to heparin infusion and has been switched to Eliquis, pharmacy dosing.  No PE on the CT.  Essential hypertension: stable on Toprol   HLD continue his statin.  CAD S/P LAD PCI- 2000: Continue Toprol, statin and Eliquis.  Small amount of blackish stool: resolved suspected to be due to hemorrhoids, had colonoscopy 08/31/2016 that showed thrombosed external hemorrhoids no further signs of bleeding hemoglobin is stable.  Monitor Recent Labs  Lab 09/11/20 1124 09/12/20 0619 09/13/20 0743 09/14/20 0444 09/16/20 1036  HGB 12.3* 10.8* 11.3* 11.7* 12.4*  HCT 38.8* 34.5* 35.6* 36.7* 38.7*   Acute confusional state appears to be alert awake oriented continue supportive measures, fall precaution. as needed medication for agitation.  Oral thrush:completed 7 days of Diflucan.  Mild leukocytosis monitor.he is  afebrile Recent Labs  Lab 09/11/20 1124 09/12/20 0619 09/13/20 0743 09/14/20 0444 09/16/20 1036  WBC 16.4* 11.8* 11.6* 12.7* 13.2*   DVT prophylaxis: SCDs Start: 08/26/20 1045 Eliquis Code Status:    Code Status: Full Code  Family Communication: plan of care discussed with patient at bedside.  Per his request I had left a voice message for his wife yesterday.  No family at bedside today.   Status is: Inpatient Remains inpatient appropriate because:Ongoing need for high flow nasal cannula, ongoing deconditioning.  Dispo: The patient is from: Home              Anticipated d/c is to: SNF vs LTACH vs CIR              Anticipated d/c date is: 3 days              Patient currently is not medically stable to d/c.  Nutrition: Diet Order            Diet Heart Room service appropriate? Yes; Fluid consistency: Thin; Fluid restriction: 1500 mL Fluid  Diet effective now                 Consultants:see note  Procedures:see note Microbiology:see note Blood Culture    Component Value Date/Time   SDES BLOOD LEFT FOREARM 08/27/2020 0456   SPECREQUEST  08/27/2020 0456    BOTTLES DRAWN AEROBIC AND ANAEROBIC Blood Culture results may not be optimal due to an inadequate volume of blood received in culture bottles   CULT  08/27/2020 0456    NO GROWTH 5 DAYS Performed at [facility], [place] [address], [place], [place] [address]    REPTSTATUS 09/01/2020 FINAL 08/27/2020 0456    Other culture-see note  Medications: Scheduled Meds: . albuterol  2 puff Inhalation TID  . apixaban  10 mg Oral BID   Followed by  . [START ON 09/17/2020] apixaban  5 mg Oral BID  . vitamin C  500 mg Oral Daily  . digoxin  0.125 mg Oral Daily  . fenofibrate  160 mg Oral Daily  . ferrous sulfate  325 mg Oral BID  . folic acid  1 mg Oral QPM  . hydrocortisone  25 mg Rectal BID  . metoprolol succinate  25 mg Oral Daily  . oxymetazoline  1 spray Each Nare BID  . pantoprazole  40 mg Oral BID  . pravastatin  40 mg Oral Daily  . saline  1 application Each Nare P3I  . sertraline  150 mg Oral Q1400  . sodium chloride  1 spray Each Nare Q6H  . zinc sulfate  220 mg Oral Daily   Continuous Infusions: . sodium  chloride 250 mL (09/13/20 0524)   Antimicrobials: Anti-infectives (From admission, onward)   Start     Dose/Rate Route Frequency Ordered Stop   09/08/20 1230  ceFEPIme (MAXIPIME) 2 g in sodium chloride 0.9 % 100 mL IVPB        2 g 200 mL/hr over 30 Minutes Intravenous Every 8 hours 09/08/20 1135 09/13/20 0554   09/03/20 1000  fluconazole (DIFLUCAN) IVPB 100 mg        100 mg 50 mL/hr over 60 Minutes Intravenous Every 24 hours 09/02/20 0822 09/08/20 1029   09/02/20 1000  fluconazole (DIFLUCAN) IVPB 200 mg        200 mg 100 mL/hr over 60 Minutes Intravenous  Once 09/02/20 0816 09/02/20 1342   09/02/20 1000  fluconazole (DIFLUCAN) IVPB 100 mg  Status:  Discontinued        100 mg 50 mL/hr over 60 Minutes Intravenous Every 24 hours 09/02/20 0816 09/02/20 0822   08/27/20 1000  remdesivir 100 mg in sodium chloride 0.9 % 100 mL IVPB       "Followed by" Linked Group Details   100 mg 200 mL/hr over 30 Minutes Intravenous Daily 08/26/20 1012 08/30/20 0957   08/27/20 1000  remdesivir 100 mg in sodium chloride 0.9 % 100 mL IVPB  Status:  Discontinued       "Followed by" Linked Group Details   100 mg 200 mL/hr over 30 Minutes Intravenous Daily 08/26/20 1046 08/26/20 1101   08/26/20 1200  remdesivir 200 mg in sodium chloride 0.9% 250 mL IVPB       "Followed by" Linked Group Details   200 mg 580 mL/hr over 30 Minutes Intravenous Once 08/26/20 1012 08/26/20 1548   08/26/20 1100  remdesivir 200 mg in sodium chloride 0.9% 250 mL IVPB  Status:  Discontinued       "Followed by" Linked Group Details   200 mg 580 mL/hr over 30 Minutes Intravenous Once 08/26/20 1046 08/26/20 1101     Objective: Vitals: Today's Vitals   09/16/20 0530 09/16/20 0536 09/16/20 1055 09/16/20 1143  BP:  129/62    Pulse:  70  75  Resp:  18  19  Temp:  97.6 F (36.4 C)    TempSrc:  Oral    SpO2:  100%  99%  Weight: 81.4 kg     Height:      PainSc:   0-No pain     Intake/Output Summary (Last 24 hours) at 09/16/2020  1254 Last data filed at 09/16/2020 0500 Gross per 24 hour  Intake 670 ml  Output 950 ml  Net -280 ml   Filed Weights   09/14/20 0535 09/15/20 0300 09/16/20 0530  Weight: 82.4 kg 82.3 kg 81.4 kg   Weight change: -0.9 kg  Intake/Output from previous day: 09/28 0701 - 09/29 0700 In: 890 [P.O.:890] Out: 950 [Urine:950] Intake/Output this shift: No intake/output data recorded.  Examination: General exam: AAOx3 , NAD, weak appearing. HEENT:Oral mucosa moist, Ear/Nose WNL grossly, dentition normal. Respiratory system: bilaterally clear,no wheezing,no use of accessory muscle Cardiovascular system: S1 & S2 +, No JVD,. Gastrointestinal system: Abdomen soft, NT,ND, BS+ Nervous System:Alert, awake, moving extremities and grossly nonfocal Extremities: Chronic venous stasis on his lower extremities, no edema, distal peripheral pulses palpable.  Skin: No rashes,no icterus. MSK: Normal muscle bulk,tone, power  Data Reviewed: I have personally reviewed following labs and imaging studies CBC: Recent Labs  Lab 09/11/20 1124 09/12/20 0619 09/13/20 0743 09/14/20 0444 09/16/20 1036  WBC 16.4* 11.8* 11.6* 12.7* 13.2*  HGB 12.3* 10.8* 11.3* 11.7* 12.4*  HCT 38.8* 34.5* 35.6* 36.7* 38.7*  MCV 82.0 81.2 81.1 81.2 82.7  PLT 220 205 240 236 063   Basic Metabolic Panel: Recent Labs  Lab 09/10/20 1703 09/11/20 1124 09/14/20 0444 09/15/20 0502 09/16/20 1036  NA 136 137 138 136 137  K 4.1 3.2* 4.4 4.1 5.1  CL 100 101 103 98 96*  CO2 24 25 26 27 28   GLUCOSE 120* 92 85 92 81  BUN 32* 28* 29* 34* 35*  CREATININE 1.11 1.05 1.26* 1.47* 1.44*  CALCIUM 8.9 9.1 9.5 9.8 10.0   GFR: Estimated Creatinine Clearance: 47.9 mL/min (A) (by C-G formula based on SCr of 1.44 mg/dL (H)). Liver Function Tests: No results for input(s): AST, ALT, ALKPHOS, BILITOT, PROT, ALBUMIN  in the last 168 hours. No results for input(s): LIPASE, AMYLASE in the last 168 hours. No results for input(s): AMMONIA in the  last 168 hours. Coagulation Profile: No results for input(s): INR, PROTIME in the last 168 hours. Cardiac Enzymes: No results for input(s): CKTOTAL, CKMB, CKMBINDEX, TROPONINI in the last 168 hours. BNP (last 3 results) No results for input(s): PROBNP in the last 8760 hours. HbA1C: No results for input(s): HGBA1C in the last 72 hours. CBG: No results for input(s): GLUCAP in the last 168 hours. Lipid Profile: No results for input(s): CHOL, HDL, LDLCALC, TRIG, CHOLHDL, LDLDIRECT in the last 72 hours. Thyroid Function Tests: No results for input(s): TSH, T4TOTAL, FREET4, T3FREE, THYROIDAB in the last 72 hours. Anemia Panel: No results for input(s): VITAMINB12, FOLATE, FERRITIN, TIBC, IRON, RETICCTPCT in the last 72 hours. Sepsis Labs: No results for input(s): PROCALCITON, LATICACIDVEN in the last 168 hours.  Recent Results (from the past 240 hour(s))  MRSA PCR Screening     Status: None   Collection Time: 09/09/20  4:38 AM   Specimen: Nasal Mucosa; Nasopharyngeal  Result Value Ref Range Status   MRSA by PCR NEGATIVE NEGATIVE Final    Comment:        The GeneXpert MRSA Assay (FDA approved for NASAL specimens only), is one component of a comprehensive MRSA colonization surveillance program. It is not intended to diagnose MRSA infection nor to guide or monitor treatment for MRSA infections. Performed at [facility], [place] [address], [place], [place] [address]      Radiology Studies: No results found.   LOS: 21 days   Antonieta Pert, MD [facility]  09/16/2020, 12:54 PM

## 2020-09-17 ENCOUNTER — Inpatient Hospital Stay (HOSPITAL_COMMUNITY): Payer: Medicare Other

## 2020-09-17 DIAGNOSIS — J9601 Acute respiratory failure with hypoxia: Secondary | ICD-10-CM

## 2020-09-17 LAB — CBC
HCT: 42.1 % (ref 39.0–52.0)
Hemoglobin: 13.2 g/dL (ref 13.0–17.0)
MCH: 25.9 pg — ABNORMAL LOW (ref 26.0–34.0)
MCHC: 31.4 g/dL (ref 30.0–36.0)
MCV: 82.7 fL (ref 80.0–100.0)
Platelets: 279 K/uL (ref 150–400)
RBC: 5.09 MIL/uL (ref 4.22–5.81)
RDW: 17.2 % — ABNORMAL HIGH (ref 11.5–15.5)
WBC: 12.3 K/uL — ABNORMAL HIGH (ref 4.0–10.5)
nRBC: 0 % (ref 0.0–0.2)

## 2020-09-17 LAB — BASIC METABOLIC PANEL WITH GFR
Anion gap: 12 (ref 5–15)
BUN: 34 mg/dL — ABNORMAL HIGH (ref 8–23)
CO2: 26 mmol/L (ref 22–32)
Calcium: 10 mg/dL (ref 8.9–10.3)
Chloride: 99 mmol/L (ref 98–111)
Creatinine, Ser: 1.51 mg/dL — ABNORMAL HIGH (ref 0.61–1.24)
GFR calc Af Amer: 53 mL/min — ABNORMAL LOW
GFR calc non Af Amer: 45 mL/min — ABNORMAL LOW
Glucose, Bld: 70 mg/dL (ref 70–99)
Potassium: 4.7 mmol/L (ref 3.5–5.1)
Sodium: 137 mmol/L (ref 135–145)

## 2020-09-17 LAB — ECHOCARDIOGRAM LIMITED
Area-P 1/2: 4.14 cm2
Calc EF: 25.7 %
Height: 70 in
S' Lateral: 4.8 cm
Single Plane A2C EF: 26.1 %
Single Plane A4C EF: 25.9 %
Weight: 2892.44 [oz_av]

## 2020-09-17 MED ORDER — PERFLUTREN LIPID MICROSPHERE
1.0000 mL | INTRAVENOUS | Status: AC | PRN
  Administered 2020-09-17: 4 mL via INTRAVENOUS
  Filled 2020-09-17: qty 10

## 2020-09-17 MED ORDER — SODIUM CHLORIDE 0.9 % IV BOLUS
250.0000 mL | Freq: Once | INTRAVENOUS | Status: AC
  Administered 2020-09-17: 250 mL via INTRAVENOUS

## 2020-09-17 NOTE — Progress Notes (Signed)
  Echocardiogram 2D Echocardiogram with definity has been performed.  [name] M 09/17/2020, 1:24 PM

## 2020-09-17 NOTE — Progress Notes (Signed)
PROGRESS NOTE    [name]  OXB:[id number] DOB: 1946-08-05 DOA: 08/25/2020 PCP: [name], MD   Chief Complaint  Patient presents with  . Shortness of Breath   Brief Narrative: 74 y.o. male past medical history significant for nonischemic cardiomyopathy with an EF of 25% status post AICD, permanent atrial fibrillation/atrial flutter on Coumadin who is fully vaccinated in March went to an out-of[place]state reunion several days prior to admission started developing shortness of breath at rest with cough nasal congestion loose stools and generalized weakness.  In the ED was found tachypneic hypoxic placed on 2 L of oxygen SARS-CoV-2 PCR was positive tropes 25, D-dimer 1.6 BNP of 425 creatinine at baseline chest x-ray showed bilateral diffuse groundglass opacities asymmetric. Patient was admitted underwent extensive hospitalization for acute hypoxic respiratory failure with Covid 19 pneumonia, acute on chronic systolic CHF, bilateral lower extremity DVT, permanent atrial fibrillation CKD stage III, CAD, oral thrush, acute confusional state. Patient is slowly improving  Subjective:  On 15 l HFNC, Rn reports he has been on 15l HFNC yesterday too, working on weaning it down. Resting comfortably on the bed.Reports he did some exercise in the room and sat at the bedside chair yesterday.  Would like to move to bigger room Afebrile, blood pressure stable. Denies any chest pain. Uptrending Creat 1.2>1.4>1.4> 1.5 Wbc down to 12k  Assessment & Plan:  Acute hypoxic respiratory failure due to pneumonia due to COVID-19 virus/COVID-19 pneumonia: Continue on high flow nasal cannula and wean as tolerated.  Discussed with nursing staff apparently has been on 15 l HFNC ,  But he is clinically feeling better has dyspnea with exertion as usual but no chest pain no shortness of breath at rest.  Able to do some PT in room he says.s/p full course of remdesivir and steroid.   Came off airborne precaution as  21 days since Covid positive COVID-19 Labs Lab Results  Component Value Date   [place] (A) 08/26/2020   [place] NEGATIVE 04/21/2020   Acute on chronic systolic CHF s/p ICD: total neg neg 3. l, wt on admission 194-211 lb>  At 180 currently. And has lost weight.Last E cho 2018:LV EF: 25% - 30%.Lasix held 9/20 due to slightly worsening creatinine and creat trending up, gently hydrating with 250 mill normal saline today.Continue to hold Lasix.  Continue on digoxin, Toprol 25 mg.  Monitor intake output and daily weight. Check limited echo for lvef.  AKI on CKD stage IIIa: Multifactorial, with CHF, Covid pneumonia as well as on diuresis.  Creatinine further trending up, skin turgor is decreased appears dehydrated giving him 250 ml ns x1.  Recent Labs  Lab 09/11/20 1124 09/14/20 0444 09/15/20 0502 09/16/20 1036 09/17/20 0720  BUN 28* 29* 34* 35* 34*  CREATININE 1.05 1.26* 1.47* 1.44* 1.51*   Permanent atrial fibrillation: v paced. Cont on toprol and Eliquis, off Coumadin  New bilateral lower extremity DVT in the setting of Covid 19 infection despite being on Coumadin and supratherapeutic INR-which is likely falsely elevated INR.  After discussing with hematology he was switched to heparin infusion and has been switched to Eliquis-pharmacy dosing.  No PE on CT scan.Pharmacy dosing.  No PE on the CT.  Essential hypertension: BP appears soft in low 100, he is on Toprol.  Monitor.    HLD continue his statin.  CAD S/P LAD PCI- 2000: Denies any chest pain.  Continue Toprol, statin and Eliquis.  Small amount of blackish stool: resolved suspected to be due  to hemorrhoids, had colonoscopy 08/31/2016 that showed thrombosed external hemorrhoids no further signs of bleeding hemoglobin is stable.  Hemoglobin has improved. Recent Labs  Lab 09/12/20 0619 09/13/20 0743 09/14/20 0444 09/16/20 1036 09/17/20 0720  HGB 10.8* 11.3* 11.7* 12.4* 13.2  HCT 34.5* 35.6* 36.7* 38.7* 42.1    Acute confusional state appears to be alert awake oriented continue supportive measures, fall precaution. as needed medication for agitation.  Oral thrush:completed 7 days of Diflucan.  Mild leukocytosis monitor.he is afebrile Recent Labs  Lab 09/12/20 0619 09/13/20 0743 09/14/20 0444 09/16/20 1036 09/17/20 0720  WBC 11.8* 11.6* 12.7* 13.2* 12.3*   DVT prophylaxis: SCDs Start: 08/26/20 1045 Eliquis Code Status:   Code Status: Full Code  Family Communication: plan of care discussed with patient at bedside.  Previously I had left voice message to his wife. I updated his wife in details. She is concerned that he is not getting better soon. She would like him to have physical therapy every day, of note his pulse ox did drop to 79 to 80% with minimal activity at bedside yesterday.  He would like to be moved to a bigger room. Will ask charge RN for that. Discussed with the patient in detail to avoid soda drinks and excessive salt intake.  Status UR:[id number] Remains inpatient appropriate because:Ongoing need for high flow nasal cannula, ongoing deconditioning. Dispo: The patient is from: Home             Anticipated d/c is to: SNF vs LTACH vs CIR.             Anticipated d/c date is: 3 days             Patient currently is not medically stable to d/c. Nutrition: Diet Order            Diet Heart Room service appropriate? Yes; Fluid consistency: Thin; Fluid restriction: 1500 mL Fluid  Diet effective now                 Consultants:see note  Procedures:see note Microbiology:see note Blood Culture    Component Value Date/Time   SDES BLOOD LEFT FOREARM 08/27/2020 0456   SPECREQUEST  08/27/2020 0456    BOTTLES DRAWN AEROBIC AND ANAEROBIC Blood Culture results may not be optimal due to an inadequate volume of blood received in culture bottles   CULT  08/27/2020 0456    NO GROWTH 5 DAYS Performed at [facility], [place] [address], [place], [place] [address]    REPTSTATUS  09/01/2020 FINAL 08/27/2020 0456    Other culture-see note  Medications: Scheduled Meds: . albuterol  2 puff Inhalation TID  . apixaban  5 mg Oral BID  . vitamin C  500 mg Oral Daily  . digoxin  0.125 mg Oral Daily  . fenofibrate  160 mg Oral Daily  . ferrous sulfate  325 mg Oral BID  . folic acid  1 mg Oral QPM  . hydrocortisone  25 mg Rectal BID  . metoprolol succinate  25 mg Oral Daily  . oxymetazoline  1 spray Each Nare BID  . pantoprazole  40 mg Oral BID  . pravastatin  40 mg Oral Daily  . saline  1 application Each Nare V7O  . sertraline  150 mg Oral Q1400  . sodium chloride  1 spray Each Nare Q6H  . zinc sulfate  220 mg Oral Daily   Continuous Infusions: . sodium chloride 250 mL (09/13/20 0524)  . sodium chloride  Antimicrobials: Anti-infectives (From admission, onward)   Start     Dose/Rate Route Frequency Ordered Stop   09/08/20 1230  ceFEPIme (MAXIPIME) 2 g in sodium chloride 0.9 % 100 mL IVPB        2 g 200 mL/hr over 30 Minutes Intravenous Every 8 hours 09/08/20 1135 09/13/20 0554   09/03/20 1000  fluconazole (DIFLUCAN) IVPB 100 mg        100 mg 50 mL/hr over 60 Minutes Intravenous Every 24 hours 09/02/20 0822 09/08/20 1029   09/02/20 1000  fluconazole (DIFLUCAN) IVPB 200 mg        200 mg 100 mL/hr over 60 Minutes Intravenous  Once 09/02/20 0816 09/02/20 1342   09/02/20 1000  fluconazole (DIFLUCAN) IVPB 100 mg  Status:  Discontinued        100 mg 50 mL/hr over 60 Minutes Intravenous Every 24 hours 09/02/20 0816 09/02/20 0822   08/27/20 1000  remdesivir 100 mg in sodium chloride 0.9 % 100 mL IVPB       "Followed by" Linked Group Details   100 mg 200 mL/hr over 30 Minutes Intravenous Daily 08/26/20 1012 08/30/20 0957   08/27/20 1000  remdesivir 100 mg in sodium chloride 0.9 % 100 mL IVPB  Status:  Discontinued       "Followed by" Linked Group Details   100 mg 200 mL/hr over 30 Minutes Intravenous Daily 08/26/20 1046 08/26/20 1101   08/26/20 1200   remdesivir 200 mg in sodium chloride 0.9% 250 mL IVPB       "Followed by" Linked Group Details   200 mg 580 mL/hr over 30 Minutes Intravenous Once 08/26/20 1012 08/26/20 1548   08/26/20 1100  remdesivir 200 mg in sodium chloride 0.9% 250 mL IVPB  Status:  Discontinued       "Followed by" Linked Group Details   200 mg 580 mL/hr over 30 Minutes Intravenous Once 08/26/20 1046 08/26/20 1101     Objective: Vitals: Today's Vitals   09/17/20 0545 09/17/20 0813 09/17/20 0945 09/17/20 1129  BP:    (!) 104/59  Pulse:  74  72  Resp:  18  19  Temp:    (!) 97.4 F (36.3 C)  TempSrc:    Oral  SpO2:  98%  99%  Weight:      Height:      PainSc: 0-No pain  0-No pain     Intake/Output Summary (Last 24 hours) at 09/17/2020 1153 Last data filed at 09/17/2020 1130 Gross per 24 hour  Intake 120 ml  Output 800 ml  Net -680 ml   Filed Weights   09/15/20 0300 09/16/20 0530 09/17/20 0333  Weight: 82.3 kg 81.4 kg 82 kg   Weight change: 0.6 kg  Intake/Output from previous day: 09/29 0701 - 09/30 0700 In: 120 [P.O.:120] Out: 350 [Urine:350] Intake/Output this shift: Total I/O In: -  Out: 450 [Urine:450]  Examination: General exam: AAOx3, weak, frail,NAD, weak appearing. HEENT:Oral mucosa moist, Ear/Nose WNL grossly, dentition normal. Respiratory system: bilaterally clear,no wheezing or crackles,no use of accessory muscle Cardiovascular system: S1 & S2 +, No JVD,. Gastrointestinal system: Abdomen soft, NT,ND, BS+ Nervous System:Alert, awake, moving extremities and grossly nonfocal Extremities: No edema, distal peripheral pulses palpable.  Skin: No rashes,no icterus.  Decreased skin turgor MSK: Normal muscle bulk,tone, power    Data Reviewed: I have personally reviewed following labs and imaging studies CBC: Recent Labs  Lab 09/12/20 0619 09/13/20 0743 09/14/20 0444 09/16/20 1036 09/17/20 0720  WBC 11.8* 11.6* 12.7*  13.2* 12.3*  HGB 10.8* 11.3* 11.7* 12.4* 13.2  HCT 34.5* 35.6*  36.7* 38.7* 42.1  MCV 81.2 81.1 81.2 82.7 82.7  PLT 205 240 236 355 867   Basic Metabolic Panel: Recent Labs  Lab 09/11/20 1124 09/14/20 0444 09/15/20 0502 09/16/20 1036 09/17/20 0720  NA 137 138 136 137 137  K 3.2* 4.4 4.1 5.1 4.7  CL 101 103 98 96* 99  CO2 25 26 27 28 26   GLUCOSE 92 85 92 81 70  BUN 28* 29* 34* 35* 34*  CREATININE 1.05 1.26* 1.47* 1.44* 1.51*  CALCIUM 9.1 9.5 9.8 10.0 10.0   GFR: Estimated Creatinine Clearance: 45.7 mL/min (A) (by C-G formula based on SCr of 1.51 mg/dL (H)). Liver Function Tests: No results for input(s): AST, ALT, ALKPHOS, BILITOT, PROT, ALBUMIN in the last 168 hours. No results for input(s): LIPASE, AMYLASE in the last 168 hours. No results for input(s): AMMONIA in the last 168 hours. Coagulation Profile: No results for input(s): INR, PROTIME in the last 168 hours. Cardiac Enzymes: No results for input(s): CKTOTAL, CKMB, CKMBINDEX, TROPONINI in the last 168 hours. BNP (last 3 results) No results for input(s): PROBNP in the last 8760 hours. HbA1C: No results for input(s): HGBA1C in the last 72 hours. CBG: No results for input(s): GLUCAP in the last 168 hours. Lipid Profile: No results for input(s): CHOL, HDL, LDLCALC, TRIG, CHOLHDL, LDLDIRECT in the last 72 hours. Thyroid Function Tests: No results for input(s): TSH, T4TOTAL, FREET4, T3FREE, THYROIDAB in the last 72 hours. Anemia Panel: No results for input(s): VITAMINB12, FOLATE, FERRITIN, TIBC, IRON, RETICCTPCT in the last 72 hours. Sepsis Labs: No results for input(s): PROCALCITON, LATICACIDVEN in the last 168 hours.  Recent Results (from the past 240 hour(s))  MRSA PCR Screening     Status: None   Collection Time: 09/09/20  4:38 AM   Specimen: Nasal Mucosa; Nasopharyngeal  Result Value Ref Range Status   MRSA by PCR NEGATIVE NEGATIVE Final    Comment:        The GeneXpert MRSA Assay (FDA approved for NASAL specimens only), is one component of a comprehensive MRSA  colonization surveillance program. It is not intended to diagnose MRSA infection nor to guide or monitor treatment for MRSA infections. Performed at [facility], [place] [address], [place], [place] [address]      Radiology Studies: No results found.   LOS: 22 days   Antonieta Pert, MD [facility]  09/17/2020, 11:53 AM

## 2020-09-18 ENCOUNTER — Encounter (HOSPITAL_COMMUNITY): Payer: Self-pay | Admitting: Internal Medicine

## 2020-09-18 LAB — BASIC METABOLIC PANEL
Anion gap: 14 (ref 5–15)
BUN: 30 mg/dL — ABNORMAL HIGH (ref 8–23)
CO2: 22 mmol/L (ref 22–32)
Calcium: 9.4 mg/dL (ref 8.9–10.3)
Chloride: 101 mmol/L (ref 98–111)
Creatinine, Ser: 1.31 mg/dL — ABNORMAL HIGH (ref 0.61–1.24)
GFR calc Af Amer: >60 mL/min (ref >60)
GFR calc non Af Amer: 54 mL/min — ABNORMAL LOW (ref >60)
Glucose, Bld: 109 mg/dL — ABNORMAL HIGH (ref 70–99)
Potassium: 3.7 mmol/L (ref 3.5–5.1)
Sodium: 137 mmol/L (ref 135–145)

## 2020-09-18 MED ORDER — FUROSEMIDE 20 MG PO TABS
20.0000 mg | ORAL_TABLET | Freq: Every day | ORAL | Status: DC
  Administered 2020-09-18 – 2020-09-19 (×2): 20 mg via ORAL
  Filled 2020-09-18 (×2): qty 1

## 2020-09-18 NOTE — Progress Notes (Signed)
Physical Therapy Treatment Patient Details Name: [name] MRN: [id number] DOB: 1945-12-31 Today's Date: 09/18/2020    History of Present Illness [name] is a 74 y.o. male with medical history significant of nonischemic cardiomyopathy with ejection fraction of 25-30%, status post ICD placement, permanent A. fib/atrial flutter-on Coumadin, hypertension, hyperlipidemia, depression/anxiety, GERD, presents to emergency department with worsening shortness of breath and cough since 1 week. Tested pos for covid.     PT Comments    Pt progressed with ambulation during session; pt with improved O2 sats during activity; pt continues to require rest breaks but is able to fully participate in session; pt is limited by overall weakness, poor endurance, balance deficits and gait deficits; pt will benefit from skilled PT to address deficits to maximize independence with functional mobility prior to discharge. Continue to recommend CIR for additional rehab to maximize independence     Follow Up Recommendations  CIR;Supervision/Assistance - 24 hour     Equipment Recommendations  Other (comment) (TBD)    Recommendations for Other Services       Precautions / Restrictions Precautions Precautions: Fall Precaution Comments: 10 L HFNC Restrictions Weight Bearing Restrictions: No    Mobility  Bed Mobility Overal bed mobility: Needs Assistance Bed Mobility: Rolling Rolling: Modified independent (Device/Increase time)   Supine to sit: Min guard;HOB elevated Sit to supine: Min assist   General bed mobility comments: Rest breaks and cues for pursed lip breathing.  Transfers Overall transfer level: Needs assistance Equipment used: Rolling walker (2 wheeled) Transfers: Sit to/from Stand Sit to Stand: Min guard Stand pivot transfers: Min assist;Mod assist       General transfer comment: performed two trials, initial trial minA; second trial mod A  Ambulation/Gait Ambulation/Gait  assistance: Min assist Gait Distance (Feet): 4 Feet Assistive device: Rolling walker (2 wheeled)   Gait velocity: decreased   General Gait Details: de sat to 88%, min guard for safety, shuffling short step length   Stairs             Wheelchair Mobility    Modified Rankin (Stroke Patients Only)       Balance Overall balance assessment: Needs assistance Sitting-balance support: Feet supported;No upper extremity supported Sitting balance-[name] Scale: Good Sitting balance - Comments: Sat EOB x 15 minutes total between bed mobility and ambulation to rest, verbal cueing to improve posture; pt able to correct but unable to maintain   Standing balance support: Bilateral upper extremity supported Standing balance-[name] Scale: Fair Standing balance comment: relies on UE support for balance on RW and external support                             Cognition Arousal/Alertness: Awake/alert Behavior During Therapy: WFL for tasks assessed/performed Overall Cognitive Status: Within Functional Limits for tasks assessed                                 General Comments: vitals: supine on 10 L HFNC 94%, sitting 96% HR 70; standing 94% 77bpm; following ambulation 88%; 75 bpm      Exercises      General Comments        Pertinent Vitals/Pain Pain Assessment: No/denies pain    Home Living                      Prior Function  PT Goals (current goals can now be found in the care plan section) Acute Rehab PT Goals Patient Stated Goal: Start trying to do more for myself. PT Goal Formulation: With patient Time For Goal Achievement: 09/23/20 Potential to Achieve Goals: Good Progress towards PT goals: Progressing toward goals    Frequency    Min 3X/week      PT Plan Current plan remains appropriate    Co-evaluation              AM-PAC PT "6 Clicks" Mobility   Outcome Measure  Help needed turning from your back to your  side while in a flat bed without using bedrails?: None Help needed moving from lying on your back to sitting on the side of a flat bed without using bedrails?: A Little Help needed moving to and from a bed to a chair (including a wheelchair)?: A Little Help needed standing up from a chair using your arms (e.g., wheelchair or bedside chair)?: A Little Help needed to walk in hospital room?: A Little Help needed climbing 3-5 steps with a railing? : A Lot 6 Click Score: 18    End of Session Equipment Utilized During Treatment: Oxygen;Gait belt Activity Tolerance: Patient limited by fatigue;Patient tolerated treatment well Patient left: with call bell/phone within reach;in bed Nurse Communication: Mobility status PT Visit Diagnosis: Unsteadiness on feet (R26.81);Muscle weakness (generalized) (M62.81);Dizziness and giddiness (R42)     Time: 1117-1140 PT Time Calculation (min) (ACUTE ONLY): 23 min  Charges:  $Gait Training: 8-22 mins $Therapeutic Activity: 8-22 mins                     Lyanne Co, DPT Acute Rehabilitation Services [phone]   [place] 09/18/2020, 12:08 PM

## 2020-09-18 NOTE — Progress Notes (Signed)
Inpatient Rehab Admissions Coordinator Note:   At this time we are recommending a CIR consult and I will place an order per our protocol.   Shann Medal, PT, DPT [phone] 09/18/20 2:06 PM

## 2020-09-18 NOTE — Progress Notes (Signed)
   09/18/20 2237  Vitals  ECG Heart Rate 71  MEWS COLOR  MEWS Score Color Green  Oxygen Therapy  SpO2 98 %  O2 Device HFNC  O2 Flow Rate (L/min) 8 L/min  MEWS Score  MEWS Temp 0  MEWS Systolic 0  MEWS Pulse 0  MEWS RR 0  MEWS LOC 0  MEWS Score 0  Oxygen titrated down to 8L HFNC. Will continue to monitor.

## 2020-09-18 NOTE — Progress Notes (Signed)
PROGRESS NOTE    [name]  JKK:[id number] DOB: 1946-07-23 DOA: 08/25/2020 PCP: [name], MD   Chief Complaint  Patient presents with  . Shortness of Breath   Brief Narrative: 74 y.o. male past medical history significant for nonischemic cardiomyopathy with an EF of 25% status post AICD, permanent atrial fibrillation/atrial flutter on Coumadin who is fully vaccinated in March went [place] an [place] several days prior to admission started developing shortness of breath at rest with cough nasal congestion loose stools and generalized weakness.  In the ED was found tachypneic hypoxic placed on 2 L of oxygen SARS-CoV-2 PCR was positive tropes 25, D-dimer 1.6 BNP of 425 creatinine at baseline chest x-ray showed bilateral diffuse groundglass opacities asymmetric. Patient was admitted underwent extensive hospitalization for acute hypoxic respiratory failure with Covid 19 pneumonia, acute on chronic systolic CHF, bilateral lower extremity DVT, permanent atrial fibrillation CKD stage III, CAD, oral thrush, acute confusional state. Patient is slowly improving 9/30- HFNC is down to 10l from 15l.  Subjective: Seen and examined this morning.  Wife at the bedside. He is moved to the bigger room. Patient reports he gets short of breath on prolonged speaking and also with minimal activity-improving overall compared to previous days On 10 HFNC Creatinine trending down 1.3, after holding Lasix. wife with multiple questions and concerns-I spent considerable time at bedside   Assessment & Plan:  Acute hypoxic respiratory failure due to pneumonia due to COVID-19 virus/COVID-19 pneumonia: Patient remains on high flow nasal cannula and we are working on weaning it down, at [place] now since 9/30.s/p full course of remdesivir and steroid.  Encourage pulmonary toileting, increase activity with PT OT as tolerated.  Of note he gets more hypoxic with minimal activity.Came off airborne precaution as  21 days since Covid positive ( + on 08/26/20).  Acute on chronic systolic CHF s/p ICD: Repeat limited echo 9/30 unchanged with EF 25 to 30% with regional wall motion normalities as before.wt on admission 194-211 lb>Wt at 180> 183 lb, going up, creatinine improved to 1.3 we will put him back on Lasix 20 mg.Continue on digoxin, Toprol 25 mg.  Monitor intake output and daily weight.  AKI on CKD stage IIIa:Multifactorial,with CHF,covid pneumonia as well as on diuresis.  Creatinine was further trending up, skin turgor was decreased -and appeared dehydrated given to 50 mils normal saline x1 9/30 creatinine today appears improving.  To stabilize his respiratory status will put him back on Lasix 20 mg, follow-up BMP tomorrow and continue to increase the Lasix  40 mg  Then further (He was on 80 in the morning and 40 mg lasix in the evening at home). Recent Labs  Lab 09/14/20 0444 09/15/20 0502 09/16/20 1036 09/17/20 0720 09/18/20 0602  BUN 29* 34* 35* 34* 30*  CREATININE 1.26* 1.47* 1.44* 1.51* 1.31*   Permanent atrial fibrillation: v paced.  Continue on toprol and Eliquis, off Coumadin  New bilateral lower extremity DVT in the setting of Covid 19 infection despite being on Coumadin and supratherapeutic INR-which is likely falsely elevated INR the setting of COVID-19 infection. After discussing with hematology he was switched to heparin infusion and subsequently to Eliquis, pharmacy dosing.  There was no PE on the CT scan  Essential hypertension: Well-controlled/soft, on low-dose Toprol.   HLD continue statin  CAD S/P LAD PCI- 2000: No chest pain here, continue home Toprol statin Eliquis.    Small amount of blackish stool: resolved suspected to be due to hemorrhoids, had colonoscopy  08/31/2016 that showed thrombosed external hemorrhoids no further signs of bleeding hemoglobin is stable.  Hemoglobin is stabilized.  Tolerating anticoagulation. Recent Labs  Lab 09/12/20 0619 09/13/20 0743  09/14/20 0444 09/16/20 1036 09/17/20 0720  HGB 10.8* 11.3* 11.7* 12.4* 13.2  HCT 34.5* 35.6* 36.7* 38.7* 42.1   Acute confusional state mental status is stable and improved at baseline currently.  Continue supportive care.  PT OT.    Oral thrush:completed 7 days of Diflucan.  Mild leukocytosis is afebrile, is downtrending.  Monitor Recent Labs  Lab 09/12/20 [phone] 09/13/20 0743 09/14/20 0444 09/16/20 1036 09/17/20 0720  WBC 11.8* 11.6* 12.7* 13.2* 12.3*   DVT prophylaxis: SCDs Start: 08/26/20 1045 Eliquis Code Status:   Code Status: Full Code  Family Communication: plan of care discussed with patient at bedside.  Previously I had left voice message to his wife as per his request. Updated 9/30 in details. She was concerned that he is not getting better soon. She would like him to have physical therapy every day, of note his pulse ox did drop to 79 to 80% with minimal activity at bedside yesterday. She would like him to be moved to a bigger room and informed RN.  Today I spent considerable time at the bedside answering wife's questions and concerns. She understands that we need to let his body heal and we need to work on getting his oxygen down to a comfortable level <5 L prior to moving him to home, in the meantime can consider different level of care CIR/LTAC/SNF.  I showed her pictures of chest x-rays showing how bad his lungs were affected by the Covid.  Status NI:DPOEUMPNT Remains inpatient appropriate because:Ongoing need for high flow nasal cannula, ongoing deconditioning. Dispo: The patient is from: Home             Anticipated d/c is to: SNF vs LTACH vs CIR.             Anticipated d/c date is: 3 days             Patient currently is not medically stable to d/c. Nutrition: Diet Order            Diet Heart Room service appropriate? Yes; Fluid consistency: Thin; Fluid restriction: 1500 mL Fluid  Diet effective now                 Consultants:see note   Procedures:  Limited 2D echo 9/30 1. There is akinesis of all mid-to-distal septal, mid-to-distal inferior,  mid-to-distal anterior and apical segments. All other segments are  severely hypokinetic.Marland Kitchen Left ventricular ejection fraction, by estimation,  is 25 to 30%. The left ventricle has  severely decreased function. The left ventricular internal cavity size was  mildly dilated.  2. Right ventricular systolic function is normal. The right ventricular  size is normal. There is mildly elevated pulmonary artery systolic  pressure.  3. The aortic valve is tricuspid. There is mild calcification of the  aortic valve. There is mild thickening of the aortic valve. Aortic valve  regurgitation is not visualized.   Comparison(s): No significant change from prior study. Compared to prior  TTE in 2018, there is no significant change  Microbiology:see note Blood Culture    Component Value Date/Time   SDES BLOOD LEFT FOREARM 08/27/2020 0456   SPECREQUEST  08/27/2020 0456    BOTTLES DRAWN AEROBIC AND ANAEROBIC Blood Culture results may not be optimal due to an inadequate volume of blood received in culture bottles  CULT  08/27/2020 0456    NO GROWTH 5 DAYS Performed at [facility], [place] [address], [place], [place] [address]    REPTSTATUS 09/01/2020 FINAL 08/27/2020 0456    Other culture-see note  Medications: Scheduled Meds: . albuterol  2 puff Inhalation TID  . apixaban  5 mg Oral BID  . vitamin C  500 mg Oral Daily  . digoxin  0.125 mg Oral Daily  . fenofibrate  160 mg Oral Daily  . ferrous sulfate  325 mg Oral BID  . folic acid  1 mg Oral QPM  . hydrocortisone  25 mg Rectal BID  . metoprolol succinate  25 mg Oral Daily  . oxymetazoline  1 spray Each Nare BID  . pantoprazole  40 mg Oral BID  . pravastatin  40 mg Oral Daily  . saline  1 application Each Nare I9J  . sertraline  150 mg Oral Q1400  . sodium chloride  1 spray Each Nare Q6H  . zinc sulfate  220 mg Oral  Daily   Continuous Infusions: . sodium chloride 250 mL (09/13/20 0524)   Antimicrobials: Anti-infectives (From admission, onward)   Start     Dose/Rate Route Frequency Ordered Stop   09/08/20 1230  ceFEPIme (MAXIPIME) 2 g in sodium chloride 0.9 % 100 mL IVPB        2 g 200 mL/hr over 30 Minutes Intravenous Every 8 hours 09/08/20 1135 09/13/20 0554   09/03/20 1000  fluconazole (DIFLUCAN) IVPB 100 mg        100 mg 50 mL/hr over 60 Minutes Intravenous Every 24 hours 09/02/20 0822 09/08/20 1029   09/02/20 1000  fluconazole (DIFLUCAN) IVPB 200 mg        200 mg 100 mL/hr over 60 Minutes Intravenous  Once 09/02/20 0816 09/02/20 1342   09/02/20 1000  fluconazole (DIFLUCAN) IVPB 100 mg  Status:  Discontinued        100 mg 50 mL/hr over 60 Minutes Intravenous Every 24 hours 09/02/20 0816 09/02/20 0822   08/27/20 1000  remdesivir 100 mg in sodium chloride 0.9 % 100 mL IVPB       "Followed by" Linked Group Details   100 mg 200 mL/hr over 30 Minutes Intravenous Daily 08/26/20 1012 08/30/20 0957   08/27/20 1000  remdesivir 100 mg in sodium chloride 0.9 % 100 mL IVPB  Status:  Discontinued       "Followed by" Linked Group Details   100 mg 200 mL/hr over 30 Minutes Intravenous Daily 08/26/20 1046 08/26/20 1101   08/26/20 1200  remdesivir 200 mg in sodium chloride 0.9% 250 mL IVPB       "Followed by" Linked Group Details   200 mg 580 mL/hr over 30 Minutes Intravenous Once 08/26/20 1012 08/26/20 1548   08/26/20 1100  remdesivir 200 mg in sodium chloride 0.9% 250 mL IVPB  Status:  Discontinued       "Followed by" Linked Group Details   200 mg 580 mL/hr over 30 Minutes Intravenous Once 08/26/20 1046 08/26/20 1101     Objective: Vitals: Today's Vitals   09/17/20 2226 09/18/20 0243 09/18/20 0429 09/18/20 0740  BP:   117/83   Pulse:   71   Resp:   18   Temp:   97.7 F (36.5 C)   TempSrc:   Oral   SpO2: 95% 96% 97% 93%  Weight:   83.1 kg   Height:      PainSc:  Intake/Output  Summary (Last 24 hours) at 09/18/2020 0746 Last data filed at 09/17/2020 2042 Gross per 24 hour  Intake 480 ml  Output 851 ml  Net -371 ml   Filed Weights   09/16/20 0530 09/17/20 0333 09/18/20 0429  Weight: 81.4 kg 82 kg 83.1 kg   Weight change: 1.1 kg  Intake/Output from previous day: 09/30 0701 - 10/01 0700 In: 480 [P.O.:480] Out: 851 [Urine:850; Stool:1] Intake/Output this shift: No intake/output data recorded.  Examination:  General exam: AAOx3 , NAD, weak appearing.  Gets mildly short of breath after speaking for some time HEENT:Oral mucosa moist, Ear/Nose WNL grossly, dentition normal. Respiratory system: bilaterally diminished with mild basal crackles on the left,NO use of accessory muscle Cardiovascular system: S1 & S2 +, No JVD,. Gastrointestinal system: Abdomen soft, NT,ND, BS+ Nervous System:Alert, awake, moving extremities and grossly nonfocal Extremities: No edema, chronic veinous stasis in legs, distal peripheral pulses palpable.  Skin: No rashes,no icterus. MSK: Normal muscle bulk,tone, power  Data Reviewed: I have personally reviewed following labs and imaging studies CBC: Recent Labs  Lab 09/12/20 0619 09/13/20 0743 09/14/20 0444 09/16/20 1036 09/17/20 0720  WBC 11.8* 11.6* 12.7* 13.2* 12.3*  HGB 10.8* 11.3* 11.7* 12.4* 13.2  HCT 34.5* 35.6* 36.7* 38.7* 42.1  MCV 81.2 81.1 81.2 82.7 82.7  PLT 205 240 236 355 710   Basic Metabolic Panel: Recent Labs  Lab 09/14/20 0444 09/15/20 0502 09/16/20 1036 09/17/20 0720 09/18/20 0602  NA 138 136 137 137 137  K 4.4 4.1 5.1 4.7 3.7  CL 103 98 96* 99 101  CO2 26 27 28 26 22   GLUCOSE 85 92 81 70 109*  BUN 29* 34* 35* 34* 30*  CREATININE 1.26* 1.47* 1.44* 1.51* 1.31*  CALCIUM 9.5 9.8 10.0 10.0 9.4   GFR: Estimated Creatinine Clearance: 52.6 mL/min (A) (by C-G formula based on SCr of 1.31 mg/dL (H)). Liver Function Tests: No results for input(s): AST, ALT, ALKPHOS, BILITOT, PROT, ALBUMIN in the last 168  hours. No results for input(s): LIPASE, AMYLASE in the last 168 hours. No results for input(s): AMMONIA in the last 168 hours. Coagulation Profile: No results for input(s): INR, PROTIME in the last 168 hours. Cardiac Enzymes: No results for input(s): CKTOTAL, CKMB, CKMBINDEX, TROPONINI in the last 168 hours. BNP (last 3 results) No results for input(s): PROBNP in the last 8760 hours. HbA1C: No results for input(s): HGBA1C in the last 72 hours. CBG: No results for input(s): GLUCAP in the last 168 hours. Lipid Profile: No results for input(s): CHOL, HDL, LDLCALC, TRIG, CHOLHDL, LDLDIRECT in the last 72 hours. Thyroid Function Tests: No results for input(s): TSH, T4TOTAL, FREET4, T3FREE, THYROIDAB in the last 72 hours. Anemia Panel: No results for input(s): VITAMINB12, FOLATE, FERRITIN, TIBC, IRON, RETICCTPCT in the last 72 hours. Sepsis Labs: No results for input(s): PROCALCITON, LATICACIDVEN in the last 168 hours.  Recent Results (from the past 240 hour(s))  MRSA PCR Screening     Status: None   Collection Time: 09/09/20  4:38 AM   Specimen: Nasal Mucosa; Nasopharyngeal  Result Value Ref Range Status   MRSA by PCR NEGATIVE NEGATIVE Final    Comment:        The GeneXpert MRSA Assay (FDA approved for NASAL specimens only), is one component of a comprehensive MRSA colonization surveillance program. It is not intended to diagnose MRSA infection nor to guide or monitor treatment for MRSA infections. Performed at [facility], [place] [address] [place], [place] [address]  Radiology Studies: ECHOCARDIOGRAM LIMITED  Result Date: 09/17/2020    ECHOCARDIOGRAM LIMITED REPORT   Patient Name:   [name] [facility] Date of Exam: 09/17/2020 Medical Rec #:  [id number]       Height:       70.0 in Accession #:    [id number]      Weight:       180.8 lb Date of Birth:  12-06-1946       BSA:          2.000 m Patient Age:    41 years        BP:           104/59 mmHg Patient Gender: M                HR:           72 bpm. Exam Location:  Inpatient Procedure: Limited Echo, Intracardiac Opacification Agent, Cardiac Doppler and            Limited Color Doppler Indications:    Acute Respiratory Insufficiency 518.82 / R06.89  History:        Patient has prior history of Echocardiogram examinations, most                 recent 10/01/2017. CHF, CAD and Previous Myocardial Infarction,                 Defibrillator, PAD, Arrythmias:Atrial Fibrillation; Risk                 Factors:Dyslipidemia. Rencent COVID pneumon, Ischemic                 cardiomyopathy, Chronic renal insufficiency.  Sonographer:    Darlina Sicilian RDCS Referring Phys: [id number] [facility]  Sonographer Comments: Technically challenging study due to limited acoustic windows. Image acquisition challenging due to respiratory motion. IMPRESSIONS  1. There is akinesis of all mid-to-distal septal, mid-to-distal inferior, mid-to-distal anterior and apical segments. All other segments are severely hypokinetic.Marland Kitchen Left ventricular ejection fraction, by estimation, is 25 to 30%. The left ventricle has severely decreased function. The left ventricular internal cavity size was mildly dilated.  2. Right ventricular systolic function is normal. The right ventricular size is normal. There is mildly elevated pulmonary artery systolic pressure.  3. The aortic valve is tricuspid. There is mild calcification of the aortic valve. There is mild thickening of the aortic valve. Aortic valve regurgitation is not visualized. Comparison(s): No significant change from prior study. Compared to prior TTE in 2018, there is no significant change. FINDINGS  Left Ventricle: There is akinesis of all mid-to-distal septal, mid-to-distal inferior, mid-to-distal anterior and apical segments.All other segments are severely hypokinetic. Left ventricular ejection fraction, by estimation, is 25 to 30%. The left ventricle has severely decreased function. The left ventricular internal cavity  size was moderately dilated. There is no left ventricular hypertrophy. Right Ventricle: The right ventricular size is normal. Right ventricular systolic function is normal. There is mildly elevated pulmonary artery systolic pressure. The tricuspid regurgitant velocity is 2.76 m/s, and with an assumed right atrial pressure of 8 mmHg, the estimated right ventricular systolic pressure is 95.3 mmHg. Mitral Valve: There is mild thickening of the mitral valve leaflet(s). Mild mitral annular calcification. Tricuspid Valve: The tricuspid valve is normal in structure. Tricuspid valve regurgitation is mild. Aortic Valve: The aortic valve is tricuspid. There is mild calcification of the aortic valve. There is mild thickening of the aortic valve. Aortic valve regurgitation is not visualized. Pulmonic  Valve: The pulmonic valve was grossly normal. Pulmonic valve regurgitation is trivial. Aorta: The aortic root and ascending aorta are structurally normal, with no evidence of dilitation. Additional Comments: A pacer wire is visualized. LEFT VENTRICLE PLAX 2D LVIDd:         6.30 cm      Diastology LVIDs:         4.80 cm      LV e' medial:    6.43 cm/s LV PW:         0.90 cm      LV E/e' medial:  9.5 LV IVS:        0.90 cm      LV e' lateral:   5.40 cm/s                             LV E/e' lateral: 11.3  LV Volumes (MOD) LV vol d, MOD A2C: 184.0 ml LV vol d, MOD A4C: 193.0 ml LV vol s, MOD A2C: 136.0 ml LV vol s, MOD A4C: 143.0 ml LV SV MOD A2C:     48.0 ml LV SV MOD A4C:     193.0 ml LV SV MOD BP:      50.0 ml LEFT ATRIUM         Index LA diam:    4.10 cm 2.05 cm/m  AORTIC VALVE LVOT Vmax:   75.10 cm/s LVOT Vmean:  46.400 cm/s LVOT VTI:    0.096 m MITRAL VALVE               TRICUSPID VALVE MV Area (PHT): 4.14 cm    TR Peak grad:   30.5 mmHg MV Decel Time: 183 msec    TR Vmax:        276.00 cm/s MV E velocity: 61.13 cm/s                            SHUNTS                            Systemic VTI: 0.10 m Gwyndolyn Kaufman MD  Electronically signed by Gwyndolyn Kaufman MD Signature Date/Time: 09/17/2020/4:36:40 PM    Final      LOS: 52 days   Antonieta Pert, MD [facility]  09/18/2020, 7:46 AM

## 2020-09-18 NOTE — Progress Notes (Signed)
Occupational Therapy Treatment Patient Details Name: [name] MRN: [id number] DOB: 12/21/1945 Today's Date: 09/18/2020    History of present illness [name] is a 74 y.o. male with medical history significant of nonischemic cardiomyopathy with ejection fraction of 25-30%, status post ICD placement, permanent A. fib/atrial flutter-on Coumadin, hypertension, hyperlipidemia, depression/anxiety, GERD, presents to emergency department with worsening shortness of breath and cough since 1 week. Tested pos for covid.    OT comments  Patient on 15 l HFNC this date.  O2 monitored throughout: de-sat to 88% times one.  With a rest break, sitting tall and breathing he rebounded to 95%.  Good participation this date.  Fortunately he has come off airborne precaution as 21 days since Covid positive, is in a new room, and this seems to have brightened his spirits.  Barriers listed below persist, and continue to impact functional independence.  Now that he is off precautions and in a bigger room, OT will look to transition to more self care.  OT will continue to follow in the acute setting.  OT will attempt to see him 3x/wk as appropriate.  Patient is trying for home with assist as needed and [facility] services, but is open to CIR post acute.   Follow Up Recommendations  CIR    Equipment Recommendations  3 in 1 bedside commode;Tub/shower seat;Wheelchair cushion (measurements OT);Wheelchair (measurements OT)    Recommendations for Other Services Rehab consult    Precautions / Restrictions Precautions Precautions: Fall Precaution Comments: 15 l HFNC. Restrictions Weight Bearing Restrictions: No       Mobility Bed Mobility Overal bed mobility: Needs Assistance Bed Mobility: Rolling Rolling: Modified independent (Device/Increase time)   Supine to sit: Min guard;HOB elevated Sit to supine: Min assist   General bed mobility comments: Rest breaks and cues for pursed lip  breathing.  Transfers Overall transfer level: Needs assistance   Transfers: Sit to/from Stand Sit to Stand: Min guard         General transfer comment: Side step to [facility] wth Rw    Balance   Sitting-balance support: Feet supported;No upper extremity supported Sitting balance-[name] Scale: Good     Standing balance support: Bilateral upper extremity supported Standing balance-[name] Scale: Fair                                Vision Baseline Vision/History: Wears glasses Wears Glasses: At all times Patient Visual Report: No change from baseline Vision Assessment?: No apparent visual deficits      Pertinent Vitals/ Pain       Pain Assessment: No/denies pain         Frequency  Min 2X/week        Progress Toward Goals  OT Goals(current goals can now be found in the care plan section)  Progress towards OT goals: Progressing toward goals  Acute Rehab OT Goals Patient Stated Goal: Start trying to do more for myself. OT Goal Formulation: With patient Time For Goal Achievement: 09/24/20 Potential to Achieve Goals: Good  Plan Discharge plan remains appropriate    Co-evaluation                 AM-PAC OT "6 Clicks" Daily Activity     Outcome Measure   Help from another person eating meals?: None Help from another person taking care of personal grooming?: None Help from another person toileting, which includes using toliet, bedpan, or urinal?: A Little Help from  another person bathing (including washing, rinsing, drying)?: A Lot   Help from another person to put on and taking off regular lower body clothing?: A Lot 6 Click Score: 15    End of Session Equipment Utilized During Treatment: Oxygen;Rolling walker  OT Visit Diagnosis: Unsteadiness on feet (R26.81);Muscle weakness (generalized) (M62.81)   Activity Tolerance No increased pain;Patient limited by fatigue   Patient Left in bed;with call bell/phone within reach;with bed alarm  set;with family/visitor present   Nurse Communication  OT worked with patient.          Time: 1004-1030 OT Time Calculation (min): 26 min  Charges: OT General Charges $OT Visit: 1 Visit OT Treatments $Self Care/Home Management : 23-37 mins  09/18/2020  Rich, OTR/L  Acute Rehabilitation Services  Office:  [place] 09/18/2020, 10:45 AM

## 2020-09-19 DIAGNOSIS — I5023 Acute on chronic systolic (congestive) heart failure: Secondary | ICD-10-CM | POA: Diagnosis not present

## 2020-09-19 DIAGNOSIS — I4819 Other persistent atrial fibrillation: Secondary | ICD-10-CM

## 2020-09-19 DIAGNOSIS — J1282 Pneumonia due to Coronavirus disease 2019: Secondary | ICD-10-CM | POA: Diagnosis not present

## 2020-09-19 DIAGNOSIS — U071 COVID-19: Secondary | ICD-10-CM | POA: Diagnosis not present

## 2020-09-19 DIAGNOSIS — N1831 Chronic kidney disease, stage 3a: Secondary | ICD-10-CM | POA: Diagnosis not present

## 2020-09-19 LAB — POTASSIUM: Potassium: 4.1 mmol/L (ref 3.5–5.1)

## 2020-09-19 LAB — BASIC METABOLIC PANEL
Anion gap: 11 (ref 5–15)
BUN: 26 mg/dL — ABNORMAL HIGH (ref 8–23)
CO2: 26 mmol/L (ref 22–32)
Calcium: 9.4 mg/dL (ref 8.9–10.3)
Chloride: 101 mmol/L (ref 98–111)
Creatinine, Ser: 1.22 mg/dL (ref 0.61–1.24)
GFR calc Af Amer: >60 mL/min (ref >60)
GFR calc non Af Amer: 59 mL/min — ABNORMAL LOW (ref >60)
Glucose, Bld: 138 mg/dL — ABNORMAL HIGH (ref 70–99)
Potassium: 6.1 mmol/L — ABNORMAL HIGH (ref 3.5–5.1)
Sodium: 138 mmol/L (ref 135–145)

## 2020-09-19 MED ORDER — ALBUTEROL SULFATE HFA 108 (90 BASE) MCG/ACT IN AERS
2.0000 | INHALATION_SPRAY | RESPIRATORY_TRACT | Status: DC | PRN
  Administered 2020-09-21: 2 via RESPIRATORY_TRACT

## 2020-09-19 MED ORDER — LACTATED RINGERS IV BOLUS
500.0000 mL | Freq: Once | INTRAVENOUS | Status: AC
  Administered 2020-09-19: 500 mL via INTRAVENOUS

## 2020-09-19 MED ORDER — FUROSEMIDE 40 MG PO TABS
40.0000 mg | ORAL_TABLET | Freq: Every day | ORAL | Status: DC
  Administered 2020-09-20: 40 mg via ORAL
  Filled 2020-09-19: qty 1

## 2020-09-19 NOTE — Progress Notes (Signed)
This note also relates to the following rows which could not be included: ECG Heart Rate - Cannot attach notes to unvalidated device data    09/19/20 0119  Oxygen Therapy  SpO2 96 %  O2 Device HFNC  O2 Flow Rate (L/min) 6 L/min  Maintaining Sats on 8L. Titrated down to 6L hfnc. Will continue to monitor.

## 2020-09-19 NOTE — Progress Notes (Addendum)
   09/19/20 0544  Vitals  ECG Heart Rate 77  MEWS COLOR  MEWS Score Color Green  Oxygen Therapy  SpO2 93 %  O2 Device Nasal Cannula  O2 Flow Rate (L/min) 3 L/min  MEWS Score  MEWS Temp 0  MEWS Systolic 0  MEWS Pulse 0  MEWS RR 0  MEWS LOC 0  MEWS Score 0  Patient maintained Sats on 6Lhfnc. Weaned down to Nasal Canula @ 3 liters. Will continue to monitor.

## 2020-09-19 NOTE — Progress Notes (Signed)
At  Changing of the shift central tele called patient had 5 beats of VT, asymptomatic, VSS report given to night RN who notify MD. see epic for new orders.

## 2020-09-19 NOTE — Progress Notes (Signed)
   09/19/20 1909  Vitals  Temp (!) 97.5 F (36.4 C)  Temp Source Oral  BP (!) 99/55  MAP (mmHg) 69  BP Location Left Arm  BP Method Automatic  Patient Position (if appropriate) Lying  Pulse Rate 69  Pulse Rate Source Monitor  Resp 20  MEWS COLOR  MEWS Score Color Green  Oxygen Therapy  SpO2 92 %  O2 Device Nasal Cannula  O2 Flow Rate (L/min) 3 L/min  Pain Assessment  Pain Scale 0-10  Pain Score 0  MEWS Score  MEWS Temp 0  MEWS Systolic 1  MEWS Pulse 0  MEWS RR 0  MEWS LOC 0  MEWS Score 1  Central tele notified RN patient had a 5 beat run of Vtach. Dr Tonie Griffith paged and notified. Orders to get repet potassium level and give 500cc bolus for soft Bp . Will continue to monitor patient.

## 2020-09-19 NOTE — Progress Notes (Signed)
Inpatient Rehab Admissions Coordinator:   Met with patient at bedside to discuss potential CIR admission.  I reached out to pt.'s wife to confirm 24/7 support at discharge. Left voicemail with request for call back. Pt. Stated interest. Will pursue for potential admit this week, pending bed availability and insurance auth.   Clemens Catholic, [place], [place] Admissions Coordinator  [phone] ([place]) [phone] (office)

## 2020-09-19 NOTE — Progress Notes (Signed)
Patient ID: [name], male   DOB: 08/27/1946, 74 y.o.   MRN: [id number]  PROGRESS NOTE    [name]  OJJ:[id number] DOB: [birth year] DOA: 08/25/2020 PCP: [name], MD    Brief Narrative:  74 y.o.malepast medical history significant for nonischemic cardiomyopathy with an EF of 25% status post AICD, permanent atrial fibrillation/atrial flutter on Coumadin who is fully vaccinated in March went [place] several days prior to admission started developing shortness of breath at rest with cough nasal congestion loose stools and generalized weakness. In the ED was found tachypneic hypoxic placed on 2 L of oxygen SARS-CoV-2 PCR was positive tropes 25, D-dimer 1.6 BNP of 425 creatinine at baseline chest x-ray showed bilateral diffuse groundglass opacities asymmetric. Patient was admitted underwent extensive hospitalization for acute hypoxic respiratory failure with Covid 19 pneumonia, acute on chronic systolic CHF, bilateral lower extremity DVT, permanent atrial fibrillation CKD stage III, CAD, oral thrush, acute confusional state. Patient is slowly improving 9/30- HFNC is down to 3Ll from 15l.   Assessment & Plan:   Principal Problem:   Pneumonia due to COVID-19 virus Active Problems:   Essential hypertension   CAD S/P LAD PCI- 2000   Atrial fibrillation (HCC)   Chronic systolic heart failure (HCC)   Sleep apnea   Nonischemic cardiomyopathy (HCC)   CKD (chronic kidney disease) stage 3, GFR 30-59 ml/min (HCC)   Acute respiratory failure with hypoxia (HCC)   Hyperlipidemia   Acute hypoxic respiratory failure due to pneumonia due to COVID-19 virus/COVID-19 pneumonia: Patient remains on high flow nasal cannula and we are working on weaning it down, at 3L now since 09/19/2020 s/p full course of remdesivir and steroid.  Encourage pulmonary toileting, increase activity with PT OT as tolerated.  Of note he gets more hypoxic with minimal activity.Came off airborne  precaution as 21 days since Covid positive ( + on 08/26/20).  Acute on chronic systolic CHF s/p ICD: Repeat limited echo 9/30 unchanged with EF 25 to 30% with regional wall motion normalities as before.wt on admission 194-211 lb>Wt at 180> 183 lb, going up, creatinine improved to 1.2 we will put him back on Lasix 20 mg.Continue on digoxin, Toprol 25 mg.  Monitor intake output and daily weight.  AKI on CKD stage IIIa:Multifactorial,with CHF,covid pneumonia as well as on diuresis.  Creatinine was further trending up, skin turgor was decreased -and appeared dehydrated given to 50 mls normal saline x1L 9/30 creatinine today appears improving.  To stabilize his respiratory status will put him back on Lasix 20 mg, follow-up BMP tomorrow and continue to increase the Lasix  40 mg  Then further (He was on 80 in the morning and 40 mg lasix in the evening at home).  Permanent atrial fibrillation: v paced.  Continue on toprol and Eliquis, off Coumadin  New bilateral lower extremity DVT in the setting of Covid 19 infection despite being on Coumadin and supratherapeutic INR-which is likely falsely elevated INR the setting of COVID-19 infection. After discussing with hematology he was switched to heparin infusion and subsequently to Eliquis, pharmacy dosing.  There was no PE on the CT scan  Essential hypertension: Well-controlled/soft, on low-dose Toprol.   HLD continue statin  CAD S/P LAD PCI- 2000: No chest pain here, continue home Toprol statin Eliquis.    Small amount of blackish stool: resolved suspected to be due to hemorrhoids, had colonoscopy 08/31/2016 that showed thrombosed external hemorrhoids no further signs of bleeding hemoglobin is stable.  Hemoglobin  is stabilized.  Tolerating anticoagulation.  Acute confusional state mental status is stable and improved at baseline currently. Continue supportive care.  PT OT.    Oral thrush:completed 7 days of Diflucan.  Mild leukocytosis is afebrile,  is downtrending.  Monitor  DVT prophylaxis: OH:YWVPXTG Code Status: Full code  Family Communication: Patient at bedside Disposition Plan: Inpatient rehab hopefully, weekly please order yesterday. Patient remains inpatient due to ongoing need for oxygen support, deconditioning, unsafe discharge plan.  Consultants:   None  Procedures:  2D echo  Antimicrobials: Anti-infectives (From admission, onward)   Start     Dose/Rate Route Frequency Ordered Stop   09/08/20 1230  ceFEPIme (MAXIPIME) 2 g in sodium chloride 0.9 % 100 mL IVPB        2 g 200 mL/hr over 30 Minutes Intravenous Every 8 hours 09/08/20 1135 09/13/20 0554   09/03/20 1000  fluconazole (DIFLUCAN) IVPB 100 mg        100 mg 50 mL/hr over 60 Minutes Intravenous Every 24 hours 09/02/20 0822 09/08/20 1029   09/02/20 1000  fluconazole (DIFLUCAN) IVPB 200 mg        200 mg 100 mL/hr over 60 Minutes Intravenous  Once 09/02/20 0816 09/02/20 1342   09/02/20 1000  fluconazole (DIFLUCAN) IVPB 100 mg  Status:  Discontinued        100 mg 50 mL/hr over 60 Minutes Intravenous Every 24 hours 09/02/20 0816 09/02/20 0822   08/27/20 1000  remdesivir 100 mg in sodium chloride 0.9 % 100 mL IVPB       "Followed by" Linked Group Details   100 mg 200 mL/hr over 30 Minutes Intravenous Daily 08/26/20 1012 08/30/20 0957   08/27/20 1000  remdesivir 100 mg in sodium chloride 0.9 % 100 mL IVPB  Status:  Discontinued       "Followed by" Linked Group Details   100 mg 200 mL/hr over 30 Minutes Intravenous Daily 08/26/20 1046 08/26/20 1101   08/26/20 1200  remdesivir 200 mg in sodium chloride 0.9% 250 mL IVPB       "Followed by" Linked Group Details   200 mg 580 mL/hr over 30 Minutes Intravenous Once 08/26/20 1012 08/26/20 1548   08/26/20 1100  remdesivir 200 mg in sodium chloride 0.9% 250 mL IVPB  Status:  Discontinued       "Followed by" Linked Group Details   200 mg 580 mL/hr over 30 Minutes Intravenous Once 08/26/20 1046 08/26/20 1101        Subjective: Continues to improve.  He feels short of breath with any exertion but has been able to wean down to 3 L at present.  He is comfortable on its at rest.  Objective: Vitals:   09/19/20 0119 09/19/20 0544 09/19/20 0554 09/19/20 0919  BP:   105/60 (!) 110/59  Pulse:   70 68  Resp:   20 20  Temp:   (!) 97.4 F (36.3 C) 98.6 F (37 C)  TempSrc:   Oral Oral  SpO2: 96% 93% 90% 97%  Weight:   82 kg   Height:        Intake/Output Summary (Last 24 hours) at 09/19/2020 1247 Last data filed at 09/19/2020 1100 Gross per 24 hour  Intake 960 ml  Output 1101 ml  Net -141 ml   Filed Weights   09/17/20 0333 09/18/20 0429 09/19/20 0554  Weight: 82 kg 83.1 kg 82 kg    Examination:  General exam: Appears calm and comfortable  Respiratory system: Diminished breath sounds  bilaterally respiratory effort normal at rest, however gets dyspneic with speaking.. Cardiovascular system: S1 & S2 heard, no JVD Gastrointestinal system: Abdomen is nondistended, soft and nontender.  Central nervous system: Alert and oriented. No focal neurological deficits. Extremities: Symmetric  Skin: No rashes Psychiatry: Judgement and insight appear normal. Mood & affect appropriate.     Data Reviewed: I have personally reviewed following labs and imaging studies  CBC: Recent Labs  Lab 09/13/20 0743 09/14/20 0444 09/16/20 1036 09/17/20 0720  WBC 11.6* 12.7* 13.2* 12.3*  HGB 11.3* 11.7* 12.4* 13.2  HCT 35.6* 36.7* 38.7* 42.1  MCV 81.1 81.2 82.7 82.7  PLT 240 236 355 161   Basic Metabolic Panel: Recent Labs  Lab 09/15/20 0502 09/16/20 1036 09/17/20 0720 09/18/20 0602 09/19/20 0822  NA 136 137 137 137 138  K 4.1 5.1 4.7 3.7 6.1*  CL 98 96* 99 101 101  CO2 27 28 26 22 26   GLUCOSE 92 81 70 109* 138*  BUN 34* 35* 34* 30* 26*  CREATININE 1.47* 1.44* 1.51* 1.31* 1.22  CALCIUM 9.8 10.0 10.0 9.4 9.4      Radiology Studies: ECHOCARDIOGRAM LIMITED  Result Date: 09/17/2020     ECHOCARDIOGRAM LIMITED REPORT   Patient Name:   [name] [facility] Date of Exam: 09/17/2020 Medical Rec #:  [id number]       Height:       70.0 in Accession #:    [id number]      Weight:       180.8 lb Date of Birth:  Aug 20, 1946       BSA:          2.000 m Patient Age:    65 years        BP:           104/59 mmHg Patient Gender: M               HR:           72 bpm. Exam Location:  Inpatient Procedure: Limited Echo, Intracardiac Opacification Agent, Cardiac Doppler and            Limited Color Doppler Indications:    Acute Respiratory Insufficiency 518.82 / R06.89  History:        Patient has prior history of Echocardiogram examinations, most                 recent 10/01/2017. CHF, CAD and Previous Myocardial Infarction,                 Defibrillator, PAD, Arrythmias:Atrial Fibrillation; Risk                 Factors:Dyslipidemia. Rencent COVID pneumon, Ischemic                 cardiomyopathy, Chronic renal insufficiency.  Sonographer:    Darlina Sicilian RDCS Referring Phys: [id number] [facility]  Sonographer Comments: Technically challenging study due to limited acoustic windows. Image acquisition challenging due to respiratory motion. IMPRESSIONS  1. There is akinesis of all mid-to-distal septal, mid-to-distal inferior, mid-to-distal anterior and apical segments. All other segments are severely hypokinetic.Marland Kitchen Left ventricular ejection fraction, by estimation, is 25 to 30%. The left ventricle has severely decreased function. The left ventricular internal cavity size was mildly dilated.  2. Right ventricular systolic function is normal. The right ventricular size is normal. There is mildly elevated pulmonary artery systolic pressure.  3. The aortic valve is tricuspid. There is mild calcification of the aortic valve. There is  mild thickening of the aortic valve. Aortic valve regurgitation is not visualized. Comparison(s): No significant change from prior study. Compared to prior TTE in 2018, there is no significant change.  FINDINGS  Left Ventricle: There is akinesis of all mid-to-distal septal, mid-to-distal inferior, mid-to-distal anterior and apical segments.All other segments are severely hypokinetic. Left ventricular ejection fraction, by estimation, is 25 to 30%. The left ventricle has severely decreased function. The left ventricular internal cavity size was moderately dilated. There is no left ventricular hypertrophy. Right Ventricle: The right ventricular size is normal. Right ventricular systolic function is normal. There is mildly elevated pulmonary artery systolic pressure. The tricuspid regurgitant velocity is 2.76 m/s, and with an assumed right atrial pressure of 8 mmHg, the estimated right ventricular systolic pressure is 62.9 mmHg. Mitral Valve: There is mild thickening of the mitral valve leaflet(s). Mild mitral annular calcification. Tricuspid Valve: The tricuspid valve is normal in structure. Tricuspid valve regurgitation is mild. Aortic Valve: The aortic valve is tricuspid. There is mild calcification of the aortic valve. There is mild thickening of the aortic valve. Aortic valve regurgitation is not visualized. Pulmonic Valve: The pulmonic valve was grossly normal. Pulmonic valve regurgitation is trivial. Aorta: The aortic root and ascending aorta are structurally normal, with no evidence of dilitation. Additional Comments: A pacer wire is visualized. LEFT VENTRICLE PLAX 2D LVIDd:         6.30 cm      Diastology LVIDs:         4.80 cm      LV e' medial:    6.43 cm/s LV PW:         0.90 cm      LV E/e' medial:  9.5 LV IVS:        0.90 cm      LV e' lateral:   5.40 cm/s                             LV E/e' lateral: 11.3  LV Volumes (MOD) LV vol d, MOD A2C: 184.0 ml LV vol d, MOD A4C: 193.0 ml LV vol s, MOD A2C: 136.0 ml LV vol s, MOD A4C: 143.0 ml LV SV MOD A2C:     48.0 ml LV SV MOD A4C:     193.0 ml LV SV MOD BP:      50.0 ml LEFT ATRIUM         Index LA diam:    4.10 cm 2.05 cm/m  AORTIC VALVE LVOT Vmax:   75.10  cm/s LVOT Vmean:  46.400 cm/s LVOT VTI:    0.096 m MITRAL VALVE               TRICUSPID VALVE MV Area (PHT): 4.14 cm    TR Peak grad:   30.5 mmHg MV Decel Time: 183 msec    TR Vmax:        276.00 cm/s MV E velocity: 61.13 cm/s                            SHUNTS                            Systemic VTI: 0.10 m Gwyndolyn Kaufman MD Electronically signed by Gwyndolyn Kaufman MD Signature Date/Time: 09/17/2020/4:36:40 PM    Final      Scheduled Meds: . apixaban  5 mg Oral  BID  . vitamin C  500 mg Oral Daily  . digoxin  0.125 mg Oral Daily  . fenofibrate  160 mg Oral Daily  . ferrous sulfate  325 mg Oral BID  . folic acid  1 mg Oral QPM  . furosemide  20 mg Oral Daily  . hydrocortisone  25 mg Rectal BID  . metoprolol succinate  25 mg Oral Daily  . oxymetazoline  1 spray Each Nare BID  . pantoprazole  40 mg Oral BID  . pravastatin  40 mg Oral Daily  . saline  1 application Each Nare I5P  . sertraline  150 mg Oral Q1400  . sodium chloride  1 spray Each Nare Q6H  . zinc sulfate  220 mg Oral Daily   Continuous Infusions: . sodium chloride 250 mL (09/13/20 0524)     LOS: 24 days    Donnamae Jude, MD 09/19/2020 12:47 PM [phone] [facility] If 7PM-7AM, please contact night-coverage 09/19/2020, 12:47 PM

## 2020-09-20 DIAGNOSIS — I5023 Acute on chronic systolic (congestive) heart failure: Secondary | ICD-10-CM | POA: Diagnosis not present

## 2020-09-20 DIAGNOSIS — U071 COVID-19: Secondary | ICD-10-CM | POA: Diagnosis not present

## 2020-09-20 DIAGNOSIS — J1282 Pneumonia due to Coronavirus disease 2019: Secondary | ICD-10-CM | POA: Diagnosis not present

## 2020-09-20 DIAGNOSIS — N1831 Chronic kidney disease, stage 3a: Secondary | ICD-10-CM | POA: Diagnosis not present

## 2020-09-20 LAB — CBC
HCT: 34.1 % — ABNORMAL LOW (ref 39.0–52.0)
Hemoglobin: 10.4 g/dL — ABNORMAL LOW (ref 13.0–17.0)
MCH: 25.1 pg — ABNORMAL LOW (ref 26.0–34.0)
MCHC: 30.5 g/dL (ref 30.0–36.0)
MCV: 82.4 fL (ref 80.0–100.0)
Platelets: 264 10*3/uL (ref 150–400)
RBC: 4.14 MIL/uL — ABNORMAL LOW (ref 4.22–5.81)
RDW: 17 % — ABNORMAL HIGH (ref 11.5–15.5)
WBC: 8.2 10*3/uL (ref 4.0–10.5)
nRBC: 0 % (ref 0.0–0.2)

## 2020-09-20 LAB — BASIC METABOLIC PANEL
Anion gap: 10 (ref 5–15)
BUN: 21 mg/dL (ref 8–23)
CO2: 27 mmol/L (ref 22–32)
Calcium: 9.4 mg/dL (ref 8.9–10.3)
Chloride: 104 mmol/L (ref 98–111)
Creatinine, Ser: 1.06 mg/dL (ref 0.61–1.24)
GFR calc Af Amer: >60 mL/min (ref >60)
GFR calc non Af Amer: >60 mL/min (ref >60)
Glucose, Bld: 92 mg/dL (ref 70–99)
Potassium: 3.7 mmol/L (ref 3.5–5.1)
Sodium: 141 mmol/L (ref 135–145)

## 2020-09-20 MED ORDER — FUROSEMIDE 40 MG PO TABS
40.0000 mg | ORAL_TABLET | Freq: Two times a day (BID) | ORAL | Status: DC
  Administered 2020-09-20 – 2020-09-26 (×12): 40 mg via ORAL
  Filled 2020-09-20 (×13): qty 1

## 2020-09-20 NOTE — Progress Notes (Signed)
Inpatient Rehab Admissions Coordinator:   I spoke with pt.'s wife over the phone and she confirmed that she can provide 24/7 support at discharge.   Clemens Catholic, [place], [place] Admissions Coordinator  [phone] ([place]) [phone] (office)

## 2020-09-20 NOTE — Progress Notes (Signed)
Patient ID: [name], male   DOB: 1945-12-28, 74 y.o.   MRN: [id number]  PROGRESS NOTE    RASTUS BORTON  QMG:[id number] DOB: 04-26-1946 DOA: 08/25/2020 PCP: [name], MD    Brief Narrative:  74 y.o.malepast medical history significant for nonischemic cardiomyopathy with an EF of 25% status post AICD, permanent atrial fibrillation/atrial flutter on Coumadin who is fully vaccinated in March [place] days prior to admission started developing shortness of breath at rest with cough nasal congestion loose stools and generalized weakness. In the ED was found tachypneic hypoxic placed on 2 L of oxygen SARS-CoV-2 PCR was positive tropes 25, D-dimer 1.6 BNP of 425 creatinine at baseline chest x-ray showed bilateral diffuse groundglass opacities asymmetric. Patient was admitted underwent extensive hospitalization for acute hypoxic respiratory failure with Covid 19 pneumonia, acute on chronic systolic CHF, bilateral lower extremity DVT, permanent atrial fibrillation CKD stage III, CAD, oral thrush, acute confusional state. Patient is slowly improving 10/2- HFNC is down to 3L from 15L.   Assessment & Plan:   Principal Problem:   Pneumonia due to COVID-19 virus Active Problems:   Essential hypertension   CAD S/P LAD PCI- 2000   Atrial fibrillation (HCC)   Chronic systolic heart failure (HCC)   Sleep apnea   Nonischemic cardiomyopathy (HCC)   CKD (chronic kidney disease) stage 3, GFR 30-59 ml/min (HCC)   Acute respiratory failure with hypoxia (HCC)   Hyperlipidemia  Acute hypoxic respiratory failure due to pneumonia due to COVID-19 virus/COVID-19 pneumonia:Patient remains on high flow nasal cannula and we are working on weaning it down,at 3L now since 09/19/2020 s/p full course of remdesivir and steroid.Encourage pulmonary toileting, increase activity with PT/OT as tolerated. Of note he gets more hypoxic with minimal activity.Came off airborne  precaution as 21 days since Covid positive( + on 08/26/20).  Acute on chronic systolic CHF s/p QBV:QXIHWT limited echo 9/30 unchanged with EF 25 to 30% with regional wall motion normalities as before.wt on admission 194-211 lb>Wt at 180> 183 lb,going up, creatinine improved to 1.2 we will put him back on Lasix 20 mg.Continue on digoxin, Toprol 25 mg. Monitor intake output and daily weight.  Down 4 L since admission.  Weight down to 80 kg today from 83 kg on 10/1.  AKI on CKD stage IIIa:Multifactorial,with CHF,covid pneumonia as well as on diuresis. Creatinine wasfurther trending up,skin turgor was decreased-and appeared dehydrated given to 50 mls normal saline x1L 9/30 creatinine today appears improving.To stabilize his respiratory status will put him back on Lasix 20 mg, follow-up BMP and continue to increase the Lasix40 mg--serum creatinine down to 1.06 this morning we will increase Lasix to 40 mg twice daily. (He was on 80 in the morning and 40mg  lasixin the evening at home).  Permanent atrial fibrillation: v paced.Continue ontoprol and Eliquis, off Coumadin  New bilateral lower extremity DVT in the setting of Covid 19 infectiondespite being on Coumadin and supratherapeutic INR-which is likely falsely elevated INRthe setting of COVID-19 infection. After discussing with hematology he was switched to heparin infusionand subsequently to Eliquis, pharmacy dosing. There was no PE on the CT scan  Essential hypertension: Well-controlled/soft, on low-dose Toprol.  HLDcontinue statin  CAD S/P LAD PCI- 2000:No chest pain here, continue home Toprol, statin, Eliquis.  Small amount of blackish stool: resolved suspected to be due to hemorrhoids, had colonoscopy 08/31/2016 that showed thrombosed external hemorrhoids no further signs of bleeding hemoglobin is stable.Hemoglobin is stabilized. Tolerating anticoagulation.  Acute  confusionalstate mental status is stable and  improved at baseline currently. Continue supportive care. PT/OT.   Oral thrush:completed 7 days of Diflucan.  Mild leukocytosisis afebrile, is downtrending. Monitor   DVT prophylaxis: PF:[id number] Code Status: Full code  Family Communication: Patient at bedside Disposition Plan: CIR   Consultants:   None  Procedures:  2D echo which revealed EF of 25 to 30% global hypokinesis, normal right ventricular function calcified and thickened aortic valve without regurgitation.  Lower extremity Dopplers which revealed acute DVT in the right and left peroneal veins  Antimicrobials: Anti-infectives (From admission, onward)   Start     Dose/Rate Route Frequency Ordered Stop   09/08/20 1230  ceFEPIme (MAXIPIME) 2 g in sodium chloride 0.9 % 100 mL IVPB        2 g 200 mL/hr over 30 Minutes Intravenous Every 8 hours 09/08/20 1135 09/13/20 0554   09/03/20 1000  fluconazole (DIFLUCAN) IVPB 100 mg        100 mg 50 mL/hr over 60 Minutes Intravenous Every 24 hours 09/02/20 0822 09/08/20 1029   09/02/20 1000  fluconazole (DIFLUCAN) IVPB 200 mg        200 mg 100 mL/hr over 60 Minutes Intravenous  Once 09/02/20 0816 09/02/20 1342   09/02/20 1000  fluconazole (DIFLUCAN) IVPB 100 mg  Status:  Discontinued        100 mg 50 mL/hr over 60 Minutes Intravenous Every 24 hours 09/02/20 0816 09/02/20 0822   08/27/20 1000  remdesivir 100 mg in sodium chloride 0.9 % 100 mL IVPB       "Followed by" Linked Group Details   100 mg 200 mL/hr over 30 Minutes Intravenous Daily 08/26/20 1012 08/30/20 0957   08/27/20 1000  remdesivir 100 mg in sodium chloride 0.9 % 100 mL IVPB  Status:  Discontinued       "Followed by" Linked Group Details   100 mg 200 mL/hr over 30 Minutes Intravenous Daily 08/26/20 1046 08/26/20 1101   08/26/20 1200  remdesivir 200 mg in sodium chloride 0.9% 250 mL IVPB       "Followed by" Linked Group Details   200 mg 580 mL/hr over 30 Minutes Intravenous Once 08/26/20 1012 08/26/20 1548     08/26/20 1100  remdesivir 200 mg in sodium chloride 0.9% 250 mL IVPB  Status:  Discontinued       "Followed by" Linked Group Details   200 mg 580 mL/hr over 30 Minutes Intravenous Once 08/26/20 1046 08/26/20 1101       Subjective: Feels well this AM.  He is tolerating on 3 L of nasal cannula for the last 24 hours.  His oxygen saturation is normal.  He still finds it he is quite fatigued with even the smallest amount of movements.  Objective: Vitals:   09/20/20 0435 09/20/20 0839 09/20/20 1204 09/20/20 1552  BP: 120/80 (!) 96/58 (!) 105/57   Pulse: 70 69 72   Resp: 16  20 18   Temp: (!) 97.4 F (36.3 C)  97.8 F (36.6 C)   TempSrc: Oral  Oral   SpO2: 99%  93% 94%  Weight:      Height:        Intake/Output Summary (Last 24 hours) at 09/20/2020 1631 Last data filed at 09/20/2020 1421 Gross per 24 hour  Intake 420 ml  Output 651 ml  Net -231 ml   Filed Weights   09/18/20 0429 09/19/20 0554 09/20/20 0023  Weight: 83.1 kg 82 kg 81 kg  Examination:  General exam: Appears calm and comfortable  Respiratory system: Diminished breath sounds bilaterally, respiratory effort normal. Cardiovascular system: S1 & S2 heard, RRR.  Gastrointestinal system: Abdomen is nondistended, soft and nontender.  Central nervous system: Alert and oriented. No focal neurological deficits. Extremities: Symmetric  Skin: No rashes Psychiatry: Judgement and insight appear normal. Mood & affect appropriate.     Data Reviewed: I have personally reviewed following labs and imaging studies  CBC: Recent Labs  Lab 09/14/20 0444 09/16/20 1036 09/17/20 0720 09/20/20 0340  WBC 12.7* 13.2* 12.3* 8.2  HGB 11.7* 12.4* 13.2 10.4*  HCT 36.7* 38.7* 42.1 34.1*  MCV 81.2 82.7 82.7 82.4  PLT 236 355 279 917   Basic Metabolic Panel: Recent Labs  Lab 09/16/20 1036 09/16/20 1036 09/17/20 0720 09/18/20 0602 09/19/20 0822 09/19/20 2136 09/20/20 0340  NA 137  --  137 137 138  --  141  K 5.1   < >  4.7 3.7 6.1* 4.1 3.7  CL 96*  --  99 101 101  --  104  CO2 28  --  26 22 26   --  27  GLUCOSE 81  --  70 109* 138*  --  92  BUN 35*  --  34* 30* 26*  --  21  CREATININE 1.44*  --  1.51* 1.31* 1.22  --  1.06  CALCIUM 10.0  --  10.0 9.4 9.4  --  9.4   < > = values in this interval not displayed.   GFR: Estimated Creatinine Clearance: 65 mL/min (by C-G formula based on SCr of 1.06 mg/dL).    Radiology Studies: No results found.   Scheduled Meds:  apixaban  5 mg Oral BID   vitamin C  500 mg Oral Daily   digoxin  0.125 mg Oral Daily   fenofibrate  160 mg Oral Daily   ferrous sulfate  325 mg Oral BID   folic acid  1 mg Oral QPM   furosemide  40 mg Oral Daily   hydrocortisone  25 mg Rectal BID   metoprolol succinate  25 mg Oral Daily   oxymetazoline  1 spray Each Nare BID   pantoprazole  40 mg Oral BID   pravastatin  40 mg Oral Daily   saline  1 application Each Nare H1T   sertraline  150 mg Oral Q1400   sodium chloride  1 spray Each Nare Q6H   zinc sulfate  220 mg Oral Daily   Continuous Infusions:  sodium chloride 250 mL (09/13/20 0524)     LOS: 25 days    Donnamae Jude, MD 09/20/2020 4:31 PM [phone] [facility] If 7PM-7AM, please contact night-coverage 09/20/2020, 4:31 PM

## 2020-09-21 DIAGNOSIS — J1282 Pneumonia due to Coronavirus disease 2019: Secondary | ICD-10-CM | POA: Diagnosis not present

## 2020-09-21 DIAGNOSIS — U071 COVID-19: Secondary | ICD-10-CM | POA: Diagnosis not present

## 2020-09-21 LAB — BASIC METABOLIC PANEL
Anion gap: 11 (ref 5–15)
BUN: 22 mg/dL (ref 8–23)
CO2: 30 mmol/L (ref 22–32)
Calcium: 9.5 mg/dL (ref 8.9–10.3)
Chloride: 101 mmol/L (ref 98–111)
Creatinine, Ser: 1.34 mg/dL — ABNORMAL HIGH (ref 0.61–1.24)
GFR calc Af Amer: >60 mL/min (ref >60)
GFR calc non Af Amer: 53 mL/min — ABNORMAL LOW (ref >60)
Glucose, Bld: 88 mg/dL (ref 70–99)
Potassium: 3.4 mmol/L — ABNORMAL LOW (ref 3.5–5.1)
Sodium: 142 mmol/L (ref 135–145)

## 2020-09-21 MED ORDER — POTASSIUM CHLORIDE CRYS ER 20 MEQ PO TBCR
40.0000 meq | EXTENDED_RELEASE_TABLET | Freq: Once | ORAL | Status: AC
  Administered 2020-09-21: 40 meq via ORAL
  Filled 2020-09-21: qty 2

## 2020-09-21 NOTE — Progress Notes (Signed)
Physical Therapy Treatment Patient Details Name: [name] MRN: [id number] DOB: 1946/09/03 Today's Date: 09/21/2020    History of Present Illness [name] is a 74 y.o. male with medical history significant of nonischemic cardiomyopathy with ejection fraction of 25-30%, status post ICD placement, permanent A. fib/atrial flutter-on Coumadin, hypertension, hyperlipidemia, depression/anxiety, GERD, presents to emergency department with worsening shortness of breath and cough since 1 week. Tested pos for covid.     PT Comments    Pt fully participated in session; pt limited due to drop in O2, 3L at rest pt at 89%, o2 dropped 80% with PT increasing to 4L, O2 improved to 88%; pt with poor posture while sitting EOB, initiated exercises for improved trunk extension strength and endurance; pt requiring supine rest break during session; pt able to continue with session and perform side stepping to [facility] following rest; PT educated and demonstrated to wife and pt supine therex (ankle pumps, quad/glute sets, heel slides, abduction, SLR), pt and wife verbalized understanding; increased time spent educating pt and wife on discharge plans and need for additional therapy to progress and return to PLOF; pt continues to demonstrate deficits in balance, strength, coordination, endurance, gait and safety and will benefit from continued skilled therapy to address deficits to maximize independence with functional mobility prior to discharge.    Follow Up Recommendations  CIR;Supervision/Assistance - 24 hour     Equipment Recommendations  Other (comment) (TBD)    Recommendations for Other Services       Precautions / Restrictions Precautions Precautions: Fall Restrictions Weight Bearing Restrictions: No    Mobility  Bed Mobility Overal bed mobility: Needs Assistance       Supine to sit: HOB elevated;Supervision Sit to supine: HOB elevated;Supervision   General bed mobility comments: Rest  breaks and cues for pursed lip breathing.  Transfers Overall transfer level: Needs assistance Equipment used: Rolling walker (2 wheeled) Transfers: Sit to/from Stand Sit to Stand: Min guard        Lateral/Scoot Transfers: Min guard General transfer comment: RW for side stepping up in bed  Ambulation/Gait                 Stairs             Wheelchair Mobility    Modified Rankin (Stroke Patients Only)       Balance Overall balance assessment: Needs assistance Sitting-balance support: Feet supported;No upper extremity supported Sitting balance-[name] Scale: Good Sitting balance - Comments: Sat EOB x 15 minutes total between bed mobility; while sitting EOB performed scapular retraction to improve postural strength; O2 varying between 80-91%; increase O2 to 4% from 3% to improve O2 levels while sitting EOB   Standing balance support: Bilateral upper extremity supported Standing balance-[name] Scale: Fair Standing balance comment: relies on UE support for balance on RW and external support                             Cognition Arousal/Alertness: Awake/alert Behavior During Therapy: WFL for tasks assessed/performed Overall Cognitive Status: Within Functional Limits for tasks assessed                                        Exercises      General Comments        Pertinent Vitals/Pain Pain Assessment: No/denies pain    Home Living  Prior Function            PT Goals (current goals can now be found in the care plan section) Acute Rehab PT Goals Patient Stated Goal: Start trying to do more for myself. PT Goal Formulation: With patient Time For Goal Achievement: 09/23/20 Potential to Achieve Goals: Good Progress towards PT goals: Progressing toward goals    Frequency    Min 3X/week      PT Plan Current plan remains appropriate    Co-evaluation              AM-PAC PT "6 Clicks"  Mobility   Outcome Measure  Help needed turning from your back to your side while in a flat bed without using bedrails?: None Help needed moving from lying on your back to sitting on the side of a flat bed without using bedrails?: A Little Help needed moving to and from a bed to a chair (including a wheelchair)?: A Little Help needed standing up from a chair using your arms (e.g., wheelchair or bedside chair)?: A Little Help needed to walk in hospital room?: A Little Help needed climbing 3-5 steps with a railing? : A Lot 6 Click Score: 18    End of Session Equipment Utilized During Treatment: Oxygen;Gait belt Activity Tolerance: Patient limited by fatigue;Patient tolerated treatment well Patient left: with call bell/phone within reach;in bed Nurse Communication: Mobility status PT Visit Diagnosis: Unsteadiness on feet (R26.81);Muscle weakness (generalized) (M62.81);Dizziness and giddiness (R42)     Time: 9604-5409 PT Time Calculation (min) (ACUTE ONLY): 39 min  Charges:  $Therapeutic Exercise: 8-22 mins $Therapeutic Activity: 23-37 mins                     Lyanne Co, DPT Acute Rehabilitation Services [phone]   [place] 09/21/2020, 12:06 PM

## 2020-09-21 NOTE — Plan of Care (Signed)
  Problem: Nutrition: Goal: Adequate nutrition will be maintained Outcome: Completed/Met   Problem: Coping: Goal: Level of anxiety will decrease Outcome: Completed/Met   Problem: Elimination: Goal: Will not experience complications related to bowel motility Outcome: Completed/Met Goal: Will not experience complications related to urinary retention Outcome: Completed/Met   Problem: Pain Managment: Goal: General experience of comfort will improve Outcome: Completed/Met   

## 2020-09-21 NOTE — Progress Notes (Signed)
Inpatient Rehab Admissions Coordinator:   I met with Pt. And wife at bedside during PT session. I opened a case with [employer] this AM. Financial cost of CIR (if Pt. Has not met his OOP max) was reviewed with pt. Wife.They remain interested in CIR admit. Will continue to follow for potential admit later this week, pending bed availability and insurance authorization.   Clemens Catholic, [place], [place] Admissions Coordinator  [phone] ([place]) [phone] (office)

## 2020-09-21 NOTE — Progress Notes (Signed)
PROGRESS NOTE    [name]  LMB:[id number] DOB: October 08, 1946 DOA: 08/25/2020 PCP: [name], MD (Confirm with patient/family/NH records and if not entered, this HAS to be entered at [facility] point of entry. "No PCP" if truly none.)   Brief Narrative: (Start on day 1 of progress note - keep it brief and live)  Patient is a 74 year old Caucasian male with history of hypertension, hyperlipidemia, atrial fibrillation, coronary artery disease s/p LAD PCI, chronic kidney disease stage III and chronic systolic heart failure presented to hospital for worsening shortness of breath, cough with nasal congestion and severe generalized weakness and found to have acute hypoxic respiratory failure secondary to Covid pneumonia and acute on chronic systolic congestive heart failure.  Patient also found to have bilateral lower extremity DVT.  Assessment & Plan:   Principal Problem:   Pneumonia due to COVID-19 virus Active Problems:   Essential hypertension   CAD S/P LAD PCI- 2000   Atrial fibrillation (HCC)   Chronic systolic heart failure (HCC)   Sleep apnea   Nonischemic cardiomyopathy (HCC)   CKD (chronic kidney disease) stage 3, GFR 30-59 ml/min (HCC)   Acute respiratory failure with hypoxia (HCC)   Hyperlipidemia   Acute hypoxic respiratory failure secondary to COVID-19 pneumonia and acute on chronic systolic CHF exacerbation. Patient is still on 3 L of oxygen with nasal cannula and the goal is to wean off oxygen by maintaining oxygen saturations within normal limits.  Patient already completed remdesivir and steroid course for Covid pneumonia.  Patient is no more on airborne precautions because it is more than 21 days since Covid positive.  Patient was also in acute on chronic CHF exacerbation.  Continue Lasix p.o. 40 mg twice daily along with digoxin and Toprol.  Daily weights with intake/output monitoring ordered.  Oxygen supplementation as needed maintain oxygen saturation within normal  limits.  Echocardiogram showed ejection fraction of 25 to 30% with regional wall motion abnormalities similar to the previous echocardiogram.  Patient is evaluated by PT/OT who recommended comprehensive inpatient rehabilitation.  Case management is working on home left and right now there is pending bed availability and insurance authorization.    COVID-19 pneumonia See above  Acute on chronic systolic CHF exacerbation s/p ICD See above  Hypokalemia Potassium supplementation ordered.  Continue to monitor BMP in the morning  New onset bilateral lower extremity DVT Continue Eliquis.  Chest CT was also done and was negative for pulmonary embolism.  Acute metabolic encephalopathy, resolved Patient is no more any acute confusion and his baseline  AKI on CKD stage III Creatinine is 1.34 today.  Continue to monitor  Hypertension Stable with Toprol  Chronic atrial fibrillation Continue Toprol and Eliquis  Hyperlipidemia Continue home statin  Coronary artery disease Stable.  Continue home Toprol, statin and Eliquis  Oral thrush, improved Patient completed Diflucan course   DVT prophylaxis: Apixaban Code Status: Full code Family Communication: His wife present at the bedside Disposition Plan: PT/OT recommended inpatient rehab   Consultants:   None  Procedures: (Don't include imaging studies which can be auto populated. Include things that cannot be auto populated i.e. Echo, Carotid and venous dopplers, Foley, Bipap, HD, tubes/drains, wound vac, central lines etc)  Transthoracic echocardiogram showed ejection fraction of 25 to 30%  Bilateral lower extremity venous ultrasound positive for DVT  Antimicrobials: (specify start and planned stop date. Auto populated tables are space occupying and do not give end dates)    Subjective: Patient is seen and evaluated at  the bedside this morning.  Patient is on 3 L of oxygen with nasal cannula.  Patient's wife is present at the  bedside.  Patient is complaining of severe weakness in bilateral lower extremities and admits of having mild shortness of breath but denies fever, chills, chest pain, nausea, vomiting, abdominal pain and urinary symptoms.  Objective: Vitals:   09/21/20 0441 09/21/20 0455 09/21/20 1131 09/21/20 1134  BP:  (!) 113/58 (!) 109/56 104/67  Pulse:  69 71 70  Resp:  17 17   Temp:  98.1 F (36.7 C) 98.2 F (36.8 C)   TempSrc:  Oral Oral   SpO2:  92% 90%   Weight: 80.6 kg     Height:        Intake/Output Summary (Last 24 hours) at 09/21/2020 1439 Last data filed at 09/21/2020 1308 Gross per 24 hour  Intake 640 ml  Output 1125 ml  Net -485 ml   Filed Weights   09/19/20 0554 09/20/20 0023 09/21/20 0441  Weight: 82 kg 81 kg 80.6 kg    Examination:  General exam: Appears calm and comfortable  Respiratory system: Patient is on 3 L of oxygen with nasal cannula.  Diminished and coarse breath sounds in bilateral lung bases.  No wheezes appreciated on auscultation. Cardiovascular system: Irregularly irregular rhythm with normal rate.  No JVD, murmurs, rubs, gallops or clicks. No pedal edema. Gastrointestinal system: Abdomen is nondistended, soft and nontender. No organomegaly or masses felt. Normal bowel sounds heard. Central nervous system: Alert and oriented. No focal neurological deficits. Extremities: Muscular strength is 4/5 in bilateral lower extremities Skin: No rashes, lesions or ulcers Psychiatry: Judgement and insight appear normal. Mood & affect appropriate.     Data Reviewed: I have personally reviewed following labs and imaging studies  CBC: Recent Labs  Lab 09/16/20 1036 09/17/20 0720 09/20/20 0340  WBC 13.2* 12.3* 8.2  HGB 12.4* 13.2 10.4*  HCT 38.7* 42.1 34.1*  MCV 82.7 82.7 82.4  PLT 355 279 981   Basic Metabolic Panel: Recent Labs  Lab 09/17/20 0720 09/17/20 0720 09/18/20 0602 09/19/20 0822 09/19/20 2136 09/20/20 0340 09/21/20 0414  NA 137  --  137 138   --  141 142  K 4.7   < > 3.7 6.1* 4.1 3.7 3.4*  CL 99  --  101 101  --  104 101  CO2 26  --  22 26  --  27 30  GLUCOSE 70  --  109* 138*  --  92 88  BUN 34*  --  30* 26*  --  21 22  CREATININE 1.51*  --  1.31* 1.22  --  1.06 1.34*  CALCIUM 10.0  --  9.4 9.4  --  9.4 9.5   < > = values in this interval not displayed.   GFR: Estimated Creatinine Clearance: 51.5 mL/min (A) (by C-G formula based on SCr of 1.34 mg/dL (H)). Liver Function Tests: No results for input(s): AST, ALT, ALKPHOS, BILITOT, PROT, ALBUMIN in the last 168 hours. No results for input(s): LIPASE, AMYLASE in the last 168 hours. No results for input(s): AMMONIA in the last 168 hours. Coagulation Profile: No results for input(s): INR, PROTIME in the last 168 hours. Cardiac Enzymes: No results for input(s): CKTOTAL, CKMB, CKMBINDEX, TROPONINI in the last 168 hours. BNP (last 3 results) No results for input(s): PROBNP in the last 8760 hours. HbA1C: No results for input(s): HGBA1C in the last 72 hours. CBG: No results for input(s): GLUCAP in the last  168 hours. Lipid Profile: No results for input(s): CHOL, HDL, LDLCALC, TRIG, CHOLHDL, LDLDIRECT in the last 72 hours. Thyroid Function Tests: No results for input(s): TSH, T4TOTAL, FREET4, T3FREE, THYROIDAB in the last 72 hours. Anemia Panel: No results for input(s): VITAMINB12, FOLATE, FERRITIN, TIBC, IRON, RETICCTPCT in the last 72 hours. Sepsis Labs: No results for input(s): PROCALCITON, LATICACIDVEN in the last 168 hours.  No results found for this or any previous visit (from the past 240 hour(s)).       Radiology Studies: No results found.      Scheduled Meds: . apixaban  5 mg Oral BID  . vitamin C  500 mg Oral Daily  . digoxin  0.125 mg Oral Daily  . fenofibrate  160 mg Oral Daily  . ferrous sulfate  325 mg Oral BID  . folic acid  1 mg Oral QPM  . furosemide  40 mg Oral BID  . hydrocortisone  25 mg Rectal BID  . metoprolol succinate  25 mg Oral Daily   . oxymetazoline  1 spray Each Nare BID  . pantoprazole  40 mg Oral BID  . pravastatin  40 mg Oral Daily  . saline  1 application Each Nare M0N  . sertraline  150 mg Oral Q1400  . sodium chloride  1 spray Each Nare Q6H  . zinc sulfate  220 mg Oral Daily   Continuous Infusions: . sodium chloride 250 mL (09/13/20 0524)     LOS: 26 days        Edmonia Lynch, MD [facility] Pager [phone]  If 7PM-7AM, please contact night-coverage [email or web address] Password [facility] 09/21/2020, 2:39 PM

## 2020-09-21 NOTE — Progress Notes (Signed)
Occupational Therapy Treatment Patient Details Name: [name] MRN: [id number] DOB: March 28, 1946 Today's Date: 09/21/2020    History of present illness [name] is a 74 y.o. male with medical history significant of nonischemic cardiomyopathy with ejection fraction of 25-30%, status post ICD placement, permanent A. fib/atrial flutter-on Coumadin, hypertension, hyperlipidemia, depression/anxiety, GERD, presents to emergency department with worsening shortness of breath and cough since 1 week. Tested pos for covid.    OT comments  OT treatment session with focus on bed mobility, energy conservation, and OOB activity tolerance with patient able to maintain static sitting balance for 3-5 min without UE support on 5L O2 via [place] with cues for pursed lip breathing. Patient states that he is looking forward to transition to CIR prior to return home with wife who is able to provide 24hr supervision/assist. Session concluded with patient lying supine in bed with meal tray set-up and all needs within reach. Patient would benefit from continued acute OT services to maximize safety and independence with self-care tasks in prep for safe d/c to next level of care.    Follow Up Recommendations  CIR    Equipment Recommendations  Other (comment) (Defer to next level of care)    Recommendations for Other Services Rehab consult    Precautions / Restrictions Precautions Precautions: Fall Restrictions Weight Bearing Restrictions: No       Mobility Bed Mobility Overal bed mobility: Needs Assistance Bed Mobility: Supine to Sit     Supine to sit: HOB elevated;Supervision Sit to supine: HOB elevated;Supervision   General bed mobility comments: Frequent rest break and cues for pursed lip breathing.   Transfers Overall transfer level: Needs assistance Equipment used: Rolling walker (2 wheeled) Transfers: Sit to/from Stand Sit to Stand: Min guard        Lateral/Scoot Transfers: Min  guard General transfer comment: Lateral scoots toward HOB with Min to CGA.     Balance Overall balance assessment: Needs assistance Sitting-balance support: Feet supported;No upper extremity supported Sitting balance-[name] Scale: Good Sitting balance - Comments: Sat EOB x 15 minutes total between bed mobility; while sitting EOB performed scapular retraction to improve postural strength; O2 varying between 80-91%; increase O2 to 4% from 3% to improve O2 levels while sitting EOB   Standing balance support: Bilateral upper extremity supported Standing balance-[name] Scale: Fair Standing balance comment: relies on UE support for balance on RW and external support                            ADL either performed or assessed with clinical judgement   ADL Overall ADL's : Needs assistance/impaired Eating/Feeding: Set up;Bed level   Grooming: Wash/dry face;Wash/dry hands;Independent;Bed level Grooming Details (indicate cue type and reason): Patient declined washing face seated EOB this date. Would likley be able to complete task seated EOB.                                General ADL Comments: Patient tolerated sitting at EOB for 3-5 minutes with close supervision A. Upon entry patient on 3L O2 via [place] bumped to 4-5L with activity. Seated EOB, patient desat to 84% but recovered within 1-2 minutes.      Vision       Perception     Praxis      Cognition Arousal/Alertness: Awake/alert Behavior During Therapy: WFL for tasks assessed/performed Overall Cognitive Status: Within Functional Limits  for tasks assessed                                          Exercises     Shoulder Instructions       General Comments      Pertinent Vitals/ Pain       Pain Assessment: No/denies pain  Home Living                                          Prior Functioning/Environment              Frequency  Min 2X/week        Progress  Toward Goals  OT Goals(current goals can now be found in the care plan section)  Progress towards OT goals: Progressing toward goals  Acute Rehab OT Goals Patient Stated Goal: Start trying to do more for myself. OT Goal Formulation: With patient Time For Goal Achievement: 09/21/20 Potential to Achieve Goals: Good ADL Goals Pt Will Perform Grooming: with set-up;sitting Pt Will Perform Upper Body Bathing: with supervision;sitting Pt Will Perform Upper Body Dressing: with supervision;sitting Pt Will Transfer to Toilet: with set-up;stand pivot transfer Pt/caregiver will Perform Home Exercise Program: Increased strength;Both right and left upper extremity;With Supervision  Plan Discharge plan remains appropriate    Co-evaluation                 AM-PAC OT "6 Clicks" Daily Activity     Outcome Measure   Help from another person eating meals?: None Help from another person taking care of personal grooming?: None Help from another person toileting, which includes using toliet, bedpan, or urinal?: A Little Help from another person bathing (including washing, rinsing, drying)?: A Lot Help from another person to put on and taking off regular upper body clothing?: A Little Help from another person to put on and taking off regular lower body clothing?: A Lot 6 Click Score: 18    End of Session Equipment Utilized During Treatment: Oxygen  OT Visit Diagnosis: Unsteadiness on feet (R26.81);Muscle weakness (generalized) (M62.81)   Activity Tolerance Patient limited by fatigue   Patient Left in bed;with call bell/phone within reach;with bed alarm set;with family/visitor present   Nurse Communication          Time: 8527-7824 OT Time Calculation (min): 23 min  Charges: OT General Charges $OT Visit: 1 Visit OT Treatments $Self Care/Home Management : 8-22 mins $Therapeutic Activity: 8-22 mins  Faydra Korman H. OTR/L Supplemental OT, Department of rehab services  [phone]   Orpha Dain R H. 09/21/2020, 1:06 PM

## 2020-09-21 NOTE — Progress Notes (Signed)
ICM remote transmission rescheduled to 10/19/2020 due to current hospitalization.

## 2020-09-22 DIAGNOSIS — J1282 Pneumonia due to Coronavirus disease 2019: Secondary | ICD-10-CM | POA: Diagnosis not present

## 2020-09-22 DIAGNOSIS — U071 COVID-19: Secondary | ICD-10-CM | POA: Diagnosis not present

## 2020-09-22 LAB — BASIC METABOLIC PANEL
Anion gap: 13 (ref 5–15)
BUN: 21 mg/dL (ref 8–23)
CO2: 30 mmol/L (ref 22–32)
Calcium: 9.5 mg/dL (ref 8.9–10.3)
Chloride: 99 mmol/L (ref 98–111)
Creatinine, Ser: 1.32 mg/dL — ABNORMAL HIGH (ref 0.61–1.24)
GFR calc Af Amer: >60 mL/min (ref >60)
GFR calc non Af Amer: 54 mL/min — ABNORMAL LOW (ref >60)
Glucose, Bld: 88 mg/dL (ref 70–99)
Potassium: 3.6 mmol/L (ref 3.5–5.1)
Sodium: 142 mmol/L (ref 135–145)

## 2020-09-22 LAB — CBC
HCT: 38 % — ABNORMAL LOW (ref 39.0–52.0)
Hemoglobin: 11.8 g/dL — ABNORMAL LOW (ref 13.0–17.0)
MCH: 25.7 pg — ABNORMAL LOW (ref 26.0–34.0)
MCHC: 31.1 g/dL (ref 30.0–36.0)
MCV: 82.8 fL (ref 80.0–100.0)
Platelets: 340 10*3/uL (ref 150–400)
RBC: 4.59 MIL/uL (ref 4.22–5.81)
RDW: 17 % — ABNORMAL HIGH (ref 11.5–15.5)
WBC: 8.3 10*3/uL (ref 4.0–10.5)
nRBC: 0 % (ref 0.0–0.2)

## 2020-09-22 NOTE — Progress Notes (Signed)
Inpatient Rehab Admissions Coordinator:    I was contacted Pt.'s insurance with request for peer-to-peer relating to request for authorization for CIR admission. I have notified Dr. Humphrey Rolls with details on how to complete.   Clemens Catholic, [place], [place] Admissions Coordinator  [phone] ([place]) [phone] (office)

## 2020-09-22 NOTE — Progress Notes (Signed)
Inpatient Rehab Admissions Coordinator:    I met with this patient to discuss potential CIR admit. I opened a case with his insurance yesterday and anticipate a response by tomorrow morning. Pt. Remains interested and I will follow for potential admission pending bed availability and insurance auth.   Clemens Catholic, [place], [place] Admissions Coordinator  [phone] ([place]) [phone] (office)

## 2020-09-22 NOTE — Care Management Important Message (Signed)
Important Message  Patient Details  Name: [name] MRN: [id number] Date of Birth: 12-21-1945   Medicare Important Message Given:  Yes     Dawayne Patricia, RN 09/22/2020, 11:49 AM

## 2020-09-22 NOTE — Care Management Important Message (Signed)
Important Message  Patient Details  Name: [name] MRN: [id number] Date of Birth: 05-04-1946   Medicare Important Message Given:  Yes gave lettter to NCM to deliver to pt.     Holli Humbles Smith 09/22/2020, 3:41 PM

## 2020-09-22 NOTE — Progress Notes (Signed)
PROGRESS NOTE    [name]  TMH:[id number] DOB: 1946-04-15 DOA: 08/25/2020 PCP: [name], MD (Confirm with patient/family/NH records and if not entered, this HAS to be entered at [facility] point of entry. "No PCP" if truly none.)   Brief Narrative: (Start on day 1 of progress note - keep it brief and live) Patient is a 74 year old Caucasian male with history of hypertension, hyperlipidemia, atrial fibrillation, coronary artery disease s/p LAD PCI, chronic kidney disease stage III and chronic systolic heart failure presented to hospital for worsening shortness of breath, cough with nasal congestion and severe generalized weakness and found to have acute hypoxic respiratory failure secondary to Covid pneumonia and acute on chronic systolic congestive heart failure.  Patient also found to have bilateral lower extremity DVT.  Patient is on Eliquis for DVT.  Oxygen requirement is also decreased to 2 L today.   Assessment & Plan:   Principal Problem:   Pneumonia due to COVID-19 virus Active Problems: Acute hypoxic respiratory failure secondary to Covid pneumonia and acute on chronic systolic CHF exacerbation.   Essential hypertension   CAD S/P LAD PCI- 2000   Atrial fibrillation (HCC)   Chronic systolic heart failure (HCC)   Sleep apnea   Nonischemic cardiomyopathy (HCC)   CKD (chronic kidney disease) stage 3, GFR 30-59 ml/min (HCC)   Acute respiratory failure with hypoxia (HCC)   Hyperlipidemia  Acute hypoxic respiratory failure secondary to COVID-19 pneumonia and acute on chronic systolic CHF exacerbation. Patient is still on 3 L of oxygen with nasal cannula and the goal is to wean off oxygen by maintaining oxygen saturations within normal limits.  Patient already completed remdesivir and steroid course for Covid pneumonia.  Patient is no more on airborne precautions because it is more than 21 days since Covid positive.  Patient was also in acute on chronic CHF exacerbation.  Continue  Lasix p.o. 40 mg twice daily along with digoxin and Toprol.  Daily weights with intake/output monitoring ordered.  Oxygen supplementation as needed maintain oxygen saturation within normal limits.  Echocardiogram showed ejection fraction of 25 to 30% with regional wall motion abnormalities similar to the previous echocardiogram.  Patient is evaluated by PT/OT who recommended comprehensive inpatient rehabilitation.  Case management is working on home left and right now there is pending bed availability and insurance authorization.    COVID-19 pneumonia See above  Acute on chronic systolic CHF exacerbation s/p ICD See above  Hypokalemia Potassium is 3.6 today.  Continue to monitor BMP in the morning  New onset bilateral lower extremity DVT Continue Eliquis.  Chest CT was also done and was negative for pulmonary embolism.  Acute metabolic encephalopathy, resolved Patient is no more acutely confused and at his baseline.  AKI on CKD stage III Creatinine is 1.32 today.  Continue to monitor  Hypertension Stable with Toprol  Chronic atrial fibrillation Continue Toprol and Eliquis  Hyperlipidemia Continue home pravastatin  Coronary artery disease Stable.  Continue home Toprol, statin and Eliquis  Oral thrush, improved Patient completed Diflucan course      DVT prophylaxis: Eliquis Code Status: Full code Family Communication: No family member present at the bedside Disposition Plan: PT/OT recommended inpatient rehab  Consultants:   None  Procedures: (Don't include imaging studies which can be auto populated. Include things that cannot be auto populated i.e. Echo, Carotid and venous dopplers, Foley, Bipap, HD, tubes/drains, wound vac, central lines etc)  Transthoracic echocardiogram showed ejection fraction of 25 to 30%  Bilateral lower extremity  venous ultrasound positive for DVT  Antimicrobials: (specify start and planned stop date. Auto populated tables are  space occupying and do not give end dates)   Subjective: Patient is seen and evaluated at the bedside this morning.  Patient is on 2 L of oxygen with nasal cannula.  Patient is complaining of weakness in bilateral lower extremities but other wise feeling well as compared to yesterday and denies fever, chills, chest pain, nausea, vomiting, abdominal pain and urinary symptoms.  Objective: Vitals:   09/22/20 0455 09/22/20 0929 09/22/20 1103 09/22/20 1204  BP: (!) 117/58 117/67 106/62 (!) 108/57  Pulse: 70 70 70   Resp:  18 17   Temp:  98.5 F (36.9 C) 98.1 F (36.7 C)   TempSrc:  Oral Oral   SpO2: 92% 96% 94%   Weight:      Height:        Intake/Output Summary (Last 24 hours) at 09/22/2020 1538 Last data filed at 09/22/2020 1407 Gross per 24 hour  Intake 680 ml  Output 795 ml  Net -115 ml   Filed Weights   09/20/20 0023 09/21/20 0441 09/22/20 0419  Weight: 81 kg 80.6 kg 80.3 kg    Examination:  General exam: Appears calm and comfortable  Respiratory system: Patient is on 2 L of oxygen with nasal cannula.  Diminished and coarse breath sounds in bilateral lung bases.  No wheezes appreciated on auscultation. Cardiovascular system: Irregularly irregular rhythm with normal rate.  No JVD, murmurs, rubs, gallops or clicks. No pedal edema. Gastrointestinal system: Abdomen is nondistended, soft and nontender. No organomegaly or masses felt. Normal bowel sounds heard. Central nervous system: Alert and oriented. No focal neurological deficits. Extremities: Muscular strength is 4/5 in bilateral lower extremities Skin: No rashes, lesions or ulcers Psychiatry: Judgement and insight appear normal. Mood & affect appropriate.     Data Reviewed: I have personally reviewed following labs and imaging studies  CBC: Recent Labs  Lab 09/16/20 1036 09/17/20 0720 09/20/20 0340 09/22/20 0536  WBC 13.2* 12.3* 8.2 8.3  HGB 12.4* 13.2 10.4* 11.8*  HCT 38.7* 42.1 34.1* 38.0*  MCV 82.7 82.7  82.4 82.8  PLT 355 279 264 094   Basic Metabolic Panel: Recent Labs  Lab 09/18/20 0602 09/18/20 0602 09/19/20 0822 09/19/20 2136 09/20/20 0340 09/21/20 0414 09/22/20 0536  NA 137  --  138  --  141 142 142  K 3.7   < > 6.1* 4.1 3.7 3.4* 3.6  CL 101  --  101  --  104 101 99  CO2 22  --  26  --  27 30 30   GLUCOSE 109*  --  138*  --  92 88 88  BUN 30*  --  26*  --  21 22 21   CREATININE 1.31*  --  1.22  --  1.06 1.34* 1.32*  CALCIUM 9.4  --  9.4  --  9.4 9.5 9.5   < > = values in this interval not displayed.   GFR: Estimated Creatinine Clearance: 52.2 mL/min (A) (by C-G formula based on SCr of 1.32 mg/dL (H)). Liver Function Tests: No results for input(s): AST, ALT, ALKPHOS, BILITOT, PROT, ALBUMIN in the last 168 hours. No results for input(s): LIPASE, AMYLASE in the last 168 hours. No results for input(s): AMMONIA in the last 168 hours. Coagulation Profile: No results for input(s): INR, PROTIME in the last 168 hours. Cardiac Enzymes: No results for input(s): CKTOTAL, CKMB, CKMBINDEX, TROPONINI in the last 168 hours. BNP (last  3 results) No results for input(s): PROBNP in the last 8760 hours. HbA1C: No results for input(s): HGBA1C in the last 72 hours. CBG: No results for input(s): GLUCAP in the last 168 hours. Lipid Profile: No results for input(s): CHOL, HDL, LDLCALC, TRIG, CHOLHDL, LDLDIRECT in the last 72 hours. Thyroid Function Tests: No results for input(s): TSH, T4TOTAL, FREET4, T3FREE, THYROIDAB in the last 72 hours. Anemia Panel: No results for input(s): VITAMINB12, FOLATE, FERRITIN, TIBC, IRON, RETICCTPCT in the last 72 hours. Sepsis Labs: No results for input(s): PROCALCITON, LATICACIDVEN in the last 168 hours.  No results found for this or any previous visit (from the past 240 hour(s)).       Radiology Studies: No results found.      Scheduled Meds: . apixaban  5 mg Oral BID  . vitamin C  500 mg Oral Daily  . digoxin  0.125 mg Oral Daily  .  fenofibrate  160 mg Oral Daily  . ferrous sulfate  325 mg Oral BID  . folic acid  1 mg Oral QPM  . furosemide  40 mg Oral BID  . hydrocortisone  25 mg Rectal BID  . metoprolol succinate  25 mg Oral Daily  . oxymetazoline  1 spray Each Nare BID  . pantoprazole  40 mg Oral BID  . pravastatin  40 mg Oral Daily  . saline  1 application Each Nare U4R  . sertraline  150 mg Oral Q1400  . sodium chloride  1 spray Each Nare Q6H  . zinc sulfate  220 mg Oral Daily   Continuous Infusions: . sodium chloride 250 mL (09/13/20 0524)     LOS: 27 days    Time spent:    Edmonia Lynch, MD [facility] Pager [phone]  If 7PM-7AM, please contact night-coverage [email or web address] Password  09/22/2020, 3:38 PM

## 2020-09-22 NOTE — Progress Notes (Signed)
Physical Therapy Treatment Patient Details Name: [name] MRN: [id number] DOB: [birth year] Today's Date: 09/22/2020    History of Present Illness [name] is a 74 y.o. male with medical history significant of nonischemic cardiomyopathy with ejection fraction of 25-30%, status post ICD placement, permanent A. fib/atrial flutter-on Coumadin, hypertension, hyperlipidemia, depression/anxiety, GERD, presents to emergency department with worsening shortness of breath and cough since 1 week. Tested pos for covid.     PT Comments    Pt participated in session, pt was limited to becoming dizzy upon sitting up, BP low with pt returning to bed; O2 dropped o 83% requiring cueing to improve breathing with O2 increasing to 91%; pt able to recall second set of exercises with 1 cue for SLR, education to perform 2-3 a day; pt very motivated to progress and perform bed mobility without assistance, HOB and use of bedrails needed due to weakness; pt continues to progress but demonstrates deficits in balance, strength, coordination, gait and endurance, pt will benefit from skilled PT intervention to address deficits and maximize independence with functional mobility prior to discharge.    Follow Up Recommendations  CIR;Supervision/Assistance - 24 hour     Equipment Recommendations  Other (comment) (TBD)    Recommendations for Other Services       Precautions / Restrictions Precautions Precautions: Fall Precaution Comments: pt on 2 L O2 Restrictions Weight Bearing Restrictions: No    Mobility  Bed Mobility Overal bed mobility: Needs Assistance Bed Mobility: Supine to Sit;Sit to Supine Rolling: Supervision   Supine to sit: HOB elevated;Supervision Sit to supine: HOB elevated;Supervision   General bed mobility comments: pt requiring use of bedrails, verbal cueing needed for hand placement to increase independence; performed scooting up in bed sitting on EOB wtih S; increased time  needed  Transfers                    Ambulation/Gait                 Stairs             Wheelchair Mobility    Modified Rankin (Stroke Patients Only)       Balance                                            Cognition                                              Exercises General Exercises - Lower Extremity Heel Slides: AROM;Both;5 reps;Supine (2 sets of 5) Hip ABduction/ADduction: AROM;Both;5 reps;Supine (2 sets of 5) Straight Leg Raises: AROM;5 reps;Both;Supine (2 sets of 5)    General Comments        Pertinent Vitals/Pain Pain Assessment: No/denies pain    Home Living                      Prior Function            PT Goals (current goals can now be found in the care plan section) Acute Rehab PT Goals Patient Stated Goal: Start trying to do more for myself. PT Goal Formulation: With patient Time For Goal Achievement: 09/23/20 Potential to Achieve Goals: Good Progress towards PT goals: Progressing  toward goals    Frequency    Min 3X/week      PT Plan Current plan remains appropriate    Co-evaluation              AM-PAC PT "6 Clicks" Mobility   Outcome Measure  Help needed turning from your back to your side while in a flat bed without using bedrails?: None Help needed moving from lying on your back to sitting on the side of a flat bed without using bedrails?: A Little Help needed moving to and from a bed to a chair (including a wheelchair)?: A Little Help needed standing up from a chair using your arms (e.g., wheelchair or bedside chair)?: A Little Help needed to walk in hospital room?: A Little Help needed climbing 3-5 steps with a railing? : A Lot 6 Click Score: 18    End of Session Equipment Utilized During Treatment: Oxygen Activity Tolerance: Patient limited by fatigue;Patient tolerated treatment well Patient left: with call bell/phone within reach;in bed Nurse  Communication: Mobility status PT Visit Diagnosis: Unsteadiness on feet (R26.81);Muscle weakness (generalized) (M62.81);Dizziness and giddiness (R42)     Time: 1016-1050 PT Time Calculation (min) (ACUTE ONLY): 34 min  Charges:  $Therapeutic Exercise: 23-37 mins                     Lyanne Co, DPT Acute Rehabilitation Services [phone]   [place] 09/22/2020, 12:10 PM

## 2020-09-23 ENCOUNTER — Telehealth: Payer: Self-pay | Admitting: Cardiology

## 2020-09-23 DIAGNOSIS — J1282 Pneumonia due to Coronavirus disease 2019: Secondary | ICD-10-CM | POA: Diagnosis not present

## 2020-09-23 DIAGNOSIS — U071 COVID-19: Secondary | ICD-10-CM | POA: Diagnosis not present

## 2020-09-23 LAB — CBC
HCT: 38.9 % — ABNORMAL LOW (ref 39.0–52.0)
Hemoglobin: 11.8 g/dL — ABNORMAL LOW (ref 13.0–17.0)
MCH: 24.8 pg — ABNORMAL LOW (ref 26.0–34.0)
MCHC: 30.3 g/dL (ref 30.0–36.0)
MCV: 81.7 fL (ref 80.0–100.0)
Platelets: 237 10*3/uL (ref 150–400)
RBC: 4.76 MIL/uL (ref 4.22–5.81)
RDW: 16.7 % — ABNORMAL HIGH (ref 11.5–15.5)
WBC: 8.7 10*3/uL (ref 4.0–10.5)
nRBC: 0 % (ref 0.0–0.2)

## 2020-09-23 LAB — BASIC METABOLIC PANEL
Anion gap: 15 (ref 5–15)
BUN: 16 mg/dL (ref 8–23)
CO2: 27 mmol/L (ref 22–32)
Calcium: 9.4 mg/dL (ref 8.9–10.3)
Chloride: 99 mmol/L (ref 98–111)
Creatinine, Ser: 1.4 mg/dL — ABNORMAL HIGH (ref 0.61–1.24)
GFR calc non Af Amer: 50 mL/min — ABNORMAL LOW (ref >60)
Glucose, Bld: 88 mg/dL (ref 70–99)
Potassium: 3.8 mmol/L (ref 3.5–5.1)
Sodium: 141 mmol/L (ref 135–145)

## 2020-09-23 NOTE — Telephone Encounter (Signed)
Spoke to patient's wife.She stated she is on her way to [facility] hospital to talk to the [place] care of her husband.Stated is doing better, but is still on 1L of O2.She wanted her husband to see Dr.Jordan before his scheduled appointment in November.Stated she will call back to schedule a sooner appointment.

## 2020-09-23 NOTE — Telephone Encounter (Signed)
New Message  Pts wife is calling, she is asking for Malachy Mood to please call her in regards to her husband. He is still in the hospital and she would like to discuss something's with her   Please call

## 2020-09-23 NOTE — Progress Notes (Signed)
Inpatient Rehab Admissions Coordinator:   I received notification that [facility] Medicare has denied Pt.'s request for inpatient rehab. Pt. Was notified of the denial  And provided the number to call if he wishes to appeal. CIR  Will sign off at this time.   Clemens Catholic, [place], [place] Admissions Coordinator  [phone] ([place]) [phone] (office)

## 2020-09-23 NOTE — Plan of Care (Signed)
  Problem: Clinical Measurements: Goal: Ability to maintain clinical measurements within normal limits will improve Outcome: Progressing   Problem: Clinical Measurements: Goal: Will remain free from infection Outcome: Progressing   

## 2020-09-23 NOTE — TOC Transition Note (Signed)
Transition of Care [facility]) - CM/SW Discharge Note   Patient Details  Name: [name] MRN: [id number] Date of Birth: 29-Aug-1946  [facility] CM/SW Contact:  Zenon Mayo, RN Phone Number: 09/23/2020, 4:50 PM   Clinical Narrative:    Patient is for possible dc tomorrow, would like to stay with [facility] for HHPT.  NCM informed MD to put order in for HHPT.  Soc will begin 24 to 48 hrs post dc.   Final next level of care: [place] Barriers to Discharge: Continued Medical Work up   Patient Goals and CMS Choice Patient states their goals for this hospitalization and ongoing recovery are:: get better CMS Medicare.gov Compare Post Acute Care list provided to:: Patient Represenative (must comment) Choice offered to / list presented to : Spouse  Discharge Placement                       Discharge Plan and Services In-house Referral: NA Discharge Planning Services: CM Consult Post Acute Care Choice: NA            DME Agency: NA       HH Arranged: PT HH Agency: [facility] [employer]) Date [place]: 09/02/20 Time [place]: 1000 Representative spoke with at [place]: [place] ([place]) Interventions     Readmission Risk Interventions Readmission Risk Prevention Plan 08/31/2020  Transportation Screening Complete  Medication Review Press photographer) Complete  HRI or [place] Not Applicable  Some recent data might be hidden

## 2020-09-23 NOTE — Progress Notes (Signed)
PROGRESS NOTE    [name]  QHU:[id number] DOB: 1946/10/05 DOA: 08/25/2020 PCP: [name], MD (Confirm with patient/family/NH records and if not entered, this HAS to be entered at [facility] point of entry. "No PCP" if truly none.)   Brief Narrative: (Start on day 1 of progress note - keep it brief and live)  Patient is a 74 year old Caucasian male with history of hypertension, hyperlipidemia, atrial fibrillation, coronary artery disease s/p LAD PCI, chronic kidney disease stage III and chronic systolic heart failure presented to hospital for worsening shortness of breath, cough with nasal congestion and severe generalized weakness and found to have acute hypoxic respiratory failure secondary to Covid pneumonia and acute on chronic systolic congestive heart failure. Patient also found to have bilateral lower extremity DVT.  Patient is on Eliquis for DVT.  Oxygen requirement is also decreased to 1 L today. Patient's condition is much improved and he will be most probably discharge in the morning. Physical therapy recommended SNF but patient and his wife decided to go home with home health PT.  Assessment & Plan:   Principal Problem:   Pneumonia due to COVID-19 virus Active Problems:   Essential hypertension   CAD S/P LAD PCI- 2000   Atrial fibrillation (HCC)   Chronic systolic heart failure (HCC)   Sleep apnea   Nonischemic cardiomyopathy (HCC)   CKD (chronic kidney disease) stage 3, GFR 30-59 ml/min (HCC)   Acute respiratory failure with hypoxia (HCC)   Hyperlipidemia   Acute hypoxic respiratory failure secondary to COVID-19 pneumonia and acute on chronic systolic CHF exacerbation. Patient's oxygen requirement has decreased 1 L today with nasal cannula and the goal is to wean off oxygen by maintaining oxygen saturations within normal limits. Patient already completed remdesivir and steroid course for Covid pneumonia. Patient is no more on airborne precautions because it is more  than 21 days since Covid positive. Patient was also in acute on chronic CHF exacerbation. Continue Lasix p.o. 40 mg twice daily along with digoxin and Toprol. Daily weights with intake/output monitoring ordered. Oxygen supplementation as needed maintain oxygen saturation within normal limits. Echocardiogram showed ejection fraction of 25 to 30% with regional wall motion abnormalities similar to the previous echocardiogram. Patient is evaluated by PT/OT who recommended SNF at this time.Patient and  his wife declined SNF and decided to go home with home health PT. Will do a walk test tomorrow for home oxygen requirement.  COVID-19 pneumonia See above  Acute on chronic systolic CHF exacerbation s/p ICD See above  Hypokalemia Potassium is 3.8 today. Continue to monitor BMP in the morning  New onset bilateral lower extremity DVT Continue Eliquis. Chest CT was also done and was negative for pulmonary embolism.  Acute metabolic encephalopathy, resolved Patient is no more acutely confused and at his baseline.  AKI on CKD stage III Creatinine is 1.4 today. Continue to monitor  Hypertension Stable with Toprol  Chronic atrial fibrillation Continue Toprol and Eliquis  Hyperlipidemia Continue home pravastatin  Coronary artery disease Stable. Continue home Toprol, statin and Eliquis  Oral thrush, improved Patient completed Diflucan course      DVT prophylaxis: Eliquis Code Status: Full code Family Communication: Patient's wife called and all the planning explained in detail. Disposition Plan: Patient will be most probably discharge home tomorrow with home health PT   Consultants:   None  Procedures:  Echocardiogram Bilateral lower extremity venous duplex ultrasound  Antimicrobials: (specify start and planned stop date. Auto populated tables are space occupying and  do not give end dates)     Subjective: Patient is seen and evaluated at the bedside  this morning. Patient is on 1 L of oxygen with nasal cannula. Patient is complaining of weakness in bilateral lower extremities but other wise feeling well as compared to yesterday and denies fever, chills, chest pain, nausea, vomiting, abdominal pain and urinary symptoms.  Objective: Vitals:   09/22/20 1204 09/22/20 1543 09/22/20 2002 09/23/20 0525  BP: (!) 108/57  (!) 105/53 100/65  Pulse:   71 71  Resp:   18 19  Temp:   99.3 F (37.4 C) 97.7 F (36.5 C)  TempSrc:   Oral Oral  SpO2:  95% 92% 93%  Weight:    79.4 kg  Height:        Intake/Output Summary (Last 24 hours) at 09/23/2020 0755 Last data filed at 09/23/2020 0500 Gross per 24 hour  Intake 660 ml  Output 1525 ml  Net -865 ml   Filed Weights   09/21/20 0441 09/22/20 0419 09/23/20 0525  Weight: 80.6 kg 80.3 kg 79.4 kg    Examination:  General exam:Appears calm and comfortable  Respiratory system:Patient is on 2 L of oxygen with nasal cannula. Diminished breath sounds in bilateral lung bases. No wheezes appreciated onauscultation. Cardiovascular system:Irregularly irregular rhythm with normal rate. No JVD, murmurs, rubs, gallops or clicks. No pedal edema. Gastrointestinal system:Abdomen is nondistended, soft and nontender. No organomegaly or masses felt. Normal bowel sounds heard. Central nervous system:Alert and oriented. No focal neurological deficits. Extremities:Muscular strength is 4/5 in bilateral lower extremities Skin: No rashes, lesions or ulcers Psychiatry:Judgement and insight appear normal. Mood &affect appropriate.     Data Reviewed: I have personally reviewed following labs and imaging studies  CBC: Recent Labs  Lab 09/16/20 1036 09/17/20 0720 09/20/20 0340 09/22/20 0536  WBC 13.2* 12.3* 8.2 8.3  HGB 12.4* 13.2 10.4* 11.8*  HCT 38.7* 42.1 34.1* 38.0*  MCV 82.7 82.7 82.4 82.8  PLT 355 279 264 275   Basic Metabolic Panel: Recent Labs  Lab 09/18/20 0602 09/18/20 0602  09/19/20 0822 09/19/20 2136 09/20/20 0340 09/21/20 0414 09/22/20 0536  NA 137  --  138  --  141 142 142  K 3.7   < > 6.1* 4.1 3.7 3.4* 3.6  CL 101  --  101  --  104 101 99  CO2 22  --  26  --  27 30 30   GLUCOSE 109*  --  138*  --  92 88 88  BUN 30*  --  26*  --  21 22 21   CREATININE 1.31*  --  1.22  --  1.06 1.34* 1.32*  CALCIUM 9.4  --  9.4  --  9.4 9.5 9.5   < > = values in this interval not displayed.   GFR: Estimated Creatinine Clearance: 52.2 mL/min (A) (by C-G formula based on SCr of 1.32 mg/dL (H)). Liver Function Tests: No results for input(s): AST, ALT, ALKPHOS, BILITOT, PROT, ALBUMIN in the last 168 hours. No results for input(s): LIPASE, AMYLASE in the last 168 hours. No results for input(s): AMMONIA in the last 168 hours. Coagulation Profile: No results for input(s): INR, PROTIME in the last 168 hours. Cardiac Enzymes: No results for input(s): CKTOTAL, CKMB, CKMBINDEX, TROPONINI in the last 168 hours. BNP (last 3 results) No results for input(s): PROBNP in the last 8760 hours. HbA1C: No results for input(s): HGBA1C in the last 72 hours. CBG: No results for input(s): GLUCAP in the last 168  hours. Lipid Profile: No results for input(s): CHOL, HDL, LDLCALC, TRIG, CHOLHDL, LDLDIRECT in the last 72 hours. Thyroid Function Tests: No results for input(s): TSH, T4TOTAL, FREET4, T3FREE, THYROIDAB in the last 72 hours. Anemia Panel: No results for input(s): VITAMINB12, FOLATE, FERRITIN, TIBC, IRON, RETICCTPCT in the last 72 hours. Sepsis Labs: No results for input(s): PROCALCITON, LATICACIDVEN in the last 168 hours.  No results found for this or any previous visit (from the past 240 hour(s)).       Radiology Studies: No results found.      Scheduled Meds: . apixaban  5 mg Oral BID  . vitamin C  500 mg Oral Daily  . digoxin  0.125 mg Oral Daily  . fenofibrate  160 mg Oral Daily  . ferrous sulfate  325 mg Oral BID  . folic acid  1 mg Oral QPM  . furosemide   40 mg Oral BID  . hydrocortisone  25 mg Rectal BID  . metoprolol succinate  25 mg Oral Daily  . oxymetazoline  1 spray Each Nare BID  . pantoprazole  40 mg Oral BID  . pravastatin  40 mg Oral Daily  . saline  1 application Each Nare G8T  . sertraline  150 mg Oral Q1400  . sodium chloride  1 spray Each Nare Q6H  . zinc sulfate  220 mg Oral Daily   Continuous Infusions: . sodium chloride 250 mL (09/13/20 0524)     LOS: 28 days    Time spent:     Edmonia Lynch, MD [facility] Pager [phone]  If 7PM-7AM, please contact night-coverage [email or web address] Password  09/23/2020, 7:55 AM

## 2020-09-23 NOTE — Progress Notes (Signed)
Occupational Therapy Treatment Patient Details Name: [name] MRN: [id number] DOB: Apr 09, 1946 Today's Date: 09/23/2020    History of present illness [name] is a 74 y.o. male with medical history significant of nonischemic cardiomyopathy with ejection fraction of 25-30%, status post ICD placement, permanent A. fib/atrial flutter-on Coumadin, hypertension, hyperlipidemia, depression/anxiety, GERD, presents to emergency department with worsening shortness of breath and cough since 1 week. Tested pos for covid.    OT comments  Pt progressing steadily. Sat EOB for grooming and performed sit<>stand side stepping along EOB x 2 with 3-4 min recover periods between bouts on 1L. Pt's Sp02 drops to 84%. Pt in good spirits. Agreed to get to chair for supper and to use [facility] for BMs with nursing staff.   Follow Up Recommendations  CIR    Equipment Recommendations  Other (comment) (defer to next venue)    Recommendations for Other Services      Precautions / Restrictions Precautions Precautions: Fall Precaution Comments: pt on 1 L O2       Mobility Bed Mobility Overal bed mobility: Needs Assistance Bed Mobility: Supine to Sit;Sit to Supine     Supine to sit: HOB elevated;Supervision Sit to supine: Supervision   General bed mobility comments: increased time, use of rails  Transfers Overall transfer level: Needs assistance Equipment used: Rolling walker (2 wheeled) Transfers: Sit to/from Stand Sit to Stand: Min assist         General transfer comment: very minimal assist to rise, cues for hand placement    Balance Overall balance assessment: Needs assistance   Sitting balance-[name] Scale: Good       Standing balance-[name] Scale: Poor Standing balance comment: reliant on B UE support                           ADL either performed or assessed with clinical judgement   ADL Overall ADL's : Needs assistance/impaired     Grooming: Oral  care;Sitting;Set up;Brushing hair                       Toileting- Clothing Manipulation and Hygiene: Total assistance;Sit to/from stand         General ADL Comments: Pt requiring 3-4 min to recover from 84-90% on 1L.     Vision       Perception     Praxis      Cognition Arousal/Alertness: Awake/alert Behavior During Therapy: WFL for tasks assessed/performed Overall Cognitive Status: Within Functional Limits for tasks assessed                                 General Comments: pt drops to 84% on 1L with transfers, sit<>stands        Exercises     Shoulder Instructions       General Comments      Pertinent Vitals/ Pain       Pain Assessment: No/denies pain  Home Living                                          Prior Functioning/Environment              Frequency  Min 2X/week        Progress Toward Goals  OT Goals(current goals can  now be found in the care plan section)  Progress towards OT goals: Progressing toward goals  Acute Rehab OT Goals Patient Stated Goal: Start trying to do more for myself. OT Goal Formulation: With patient Time For Goal Achievement: 10/05/20 Potential to Achieve Goals: Good  Plan Discharge plan remains appropriate    Co-evaluation                 AM-PAC OT "6 Clicks" Daily Activity     Outcome Measure   Help from another person eating meals?: None Help from another person taking care of personal grooming?: A Little Help from another person toileting, which includes using toliet, bedpan, or urinal?: A Lot Help from another person bathing (including washing, rinsing, drying)?: A Lot Help from another person to put on and taking off regular upper body clothing?: A Little Help from another person to put on and taking off regular lower body clothing?: A Lot 6 Click Score: 16    End of Session Equipment Utilized During Treatment: Oxygen;Rolling walker (1L)  OT Visit  Diagnosis: Unsteadiness on feet (R26.81);Muscle weakness (generalized) (M62.81)   Activity Tolerance Patient limited by fatigue   Patient Left in bed;with call bell/phone within reach;with bed alarm set (SW in room)   Nurse Communication Mobility status        Time: 1859-0931 OT Time Calculation (min): 35 min  Charges: OT General Charges $OT Visit: 1 Visit OT Treatments $Self Care/Home Management : 8-22 mins $Therapeutic Activity: 8-22 mins  [name], OTR/L Acute Rehabilitation Services Pager: [phone] Office: [phone]   [name] 09/23/2020, 3:56 PM

## 2020-09-23 NOTE — TOC Progression Note (Signed)
Transition of Care [facility]) - Progression Note    Patient Details  Name: JAIDYN KUHL MRN: [id number] Date of Birth: [birth year]  [facility] CM/SW Contact  Zenon Mayo, RN Phone Number: 09/23/2020, 4:47 PM  Clinical Narrative:    NCM spoke with patient wife, per CSW they want to go home with HHPT, they do not want SNF.  NCM asked wife if wanted to keep [facility], she states yes.  NCM contacted Helene Kelp with [facility] to let her know patient is for possible dc tomorrow.     Expected Discharge Plan: [place] Barriers to Discharge: Continued Medical Work up  Expected Discharge Plan and Services Expected Discharge Plan: [place] In-house Referral: NA Discharge Planning Services: CM Consult Post Acute Care Choice: NA Living arrangements for the past 2 months: Single Family Home                   DME Agency: NA       HH Arranged: PT HH Agency: [place] Date [facility] Agency Contacted: 08/31/20 Time Centre: [id number] Representative spoke with at [place]: [place]) Interventions    Readmission Risk Interventions Readmission Risk Prevention Plan 08/31/2020  Transportation Screening Complete  Medication Review Press [employer]) Complete  HRI or [place] Complete  [place] Not Applicable  Some recent data might be hidden

## 2020-09-24 ENCOUNTER — Other Ambulatory Visit (HOSPITAL_COMMUNITY): Payer: Self-pay | Admitting: Internal Medicine

## 2020-09-24 DIAGNOSIS — U071 COVID-19: Secondary | ICD-10-CM | POA: Diagnosis not present

## 2020-09-24 DIAGNOSIS — J1282 Pneumonia due to Coronavirus disease 2019: Secondary | ICD-10-CM | POA: Diagnosis not present

## 2020-09-24 LAB — CBC
HCT: 38.7 % — ABNORMAL LOW (ref 39.0–52.0)
Hemoglobin: 12.1 g/dL — ABNORMAL LOW (ref 13.0–17.0)
MCH: 25.7 pg — ABNORMAL LOW (ref 26.0–34.0)
MCHC: 31.3 g/dL (ref 30.0–36.0)
MCV: 82.2 fL (ref 80.0–100.0)
Platelets: 375 10*3/uL (ref 150–400)
RBC: 4.71 MIL/uL (ref 4.22–5.81)
RDW: 16.4 % — ABNORMAL HIGH (ref 11.5–15.5)
WBC: 9.1 10*3/uL (ref 4.0–10.5)
nRBC: 0 % (ref 0.0–0.2)

## 2020-09-24 LAB — BASIC METABOLIC PANEL
Anion gap: 12 (ref 5–15)
BUN: 21 mg/dL (ref 8–23)
CO2: 31 mmol/L (ref 22–32)
Calcium: 9.4 mg/dL (ref 8.9–10.3)
Chloride: 99 mmol/L (ref 98–111)
Creatinine, Ser: 1.47 mg/dL — ABNORMAL HIGH (ref 0.61–1.24)
GFR calc non Af Amer: 47 mL/min — ABNORMAL LOW (ref >60)
Glucose, Bld: 95 mg/dL (ref 70–99)
Potassium: 3.3 mmol/L — ABNORMAL LOW (ref 3.5–5.1)
Sodium: 142 mmol/L (ref 135–145)

## 2020-09-24 MED ORDER — APIXABAN 5 MG PO TABS
5.0000 mg | ORAL_TABLET | Freq: Two times a day (BID) | ORAL | 0 refills | Status: DC
Start: 2020-09-24 — End: 2020-10-15

## 2020-09-24 MED FILL — ELIQUIS 5 MG TABLET: 5 | 30 days supply | Qty: 60 | Fill #0

## 2020-09-24 NOTE — Progress Notes (Signed)
Physical Therapy Treatment Patient Details Name: [name] MRN: [id number] DOB: December 25, 1945 Today's Date: 09/24/2020    History of Present Illness [name] is a 74 y.o. male with medical history significant of nonischemic cardiomyopathy with ejection fraction of 25-30%, status post ICD placement, permanent A. fib/atrial flutter-on Coumadin, hypertension, hyperlipidemia, depression/anxiety, GERD, presents to emergency department with worsening shortness of breath and cough since 1 week. Tested pos for covid.     PT Comments    Pt admitted with above diagnosis. Pt met 4/4 goals and goals revised.  Pt progressing well overall but still needs continued therapy due to poor endurance for activity.  Given that REhab denied his stay, wife wants to take pt home.  Recommmend wheelchair but they currently decline stating that the rollator is enough equipment. Talked with pt today after treatment and he is aware that currently he has to rest after 7 feet and recover and he still states he will be fine with wife at home.  Recommend [place] f/u as well.  Pt currently with functional limitations due to balance and endurance deficits. Pt will benefit from skilled PT to increase their independence and safety with mobility to allow discharge to the venue listed below.     Follow Up Recommendations  Home health PT;Supervision/Assistance - 24 hour (HHOT, HHAide)     Equipment Recommendations  Wheelchair (18x16 lightweight wheelchair with desk armrests and foot rests, anti tippers);Wheelchair cushion (18x16 pressure relieving cushion); rollator, home O2    Recommendations for Other Services       Precautions / Restrictions Precautions Precautions: Fall Restrictions Weight Bearing Restrictions: No    Mobility  Bed Mobility Overal bed mobility: Needs Assistance Bed Mobility: Supine to Sit;Sit to Supine     Supine to sit: HOB elevated;Supervision Sit to supine: Supervision   General bed mobility  comments: increased time, use of rails.  Desat to 88% on 4L to EOB.    Transfers Overall transfer level: Needs assistance Equipment used: 4-wheeled walker Transfers: Sit to/from Stand Sit to Stand: Min guard;Min assist         General transfer comment: very minimal assist to rise, cues for hand placement  Ambulation/Gait Ambulation/Gait assistance: Min assist Gait Distance (Feet): 14 Feet (7 feet x 2) Assistive device: 4-wheeled walker Gait Pattern/deviations: Step-to pattern;Decreased stride length;Trunk flexed;Wide base of support Gait velocity: decreased Gait velocity interpretation: <1.31 ft/sec, indicative of household ambulator General Gait Details: Pt sats on arrival were 87-91% on 4L at rest.  Pt ambulated 7 feet and needed to rest on rollator.  Pt de sat to 81% intitally with 4LO2 and once he sat and rested on the rollator to 75% therefore placed him on 10LHFNC and pt back up to 86%-91% O2.  Pt took a 5 min rest on rollator and then walked back to bed with 10LO2 HFNC and sats dipped again but did recover quickly once back in bed.  Returned O2 to 4L and sats 87-90%.  min guard for safety, shuffling short step length   Stairs             Wheelchair Mobility    Modified Rankin (Stroke Patients Only)       Balance Overall balance assessment: Needs assistance Sitting-balance support: Feet supported;No upper extremity supported Sitting balance-[name] Scale: Good     Standing balance support: Bilateral upper extremity supported Standing balance-[name] Scale: Poor Standing balance comment: reliant on B UE support  Cognition Arousal/Alertness: Awake/alert Behavior During Therapy: WFL for tasks assessed/performed Overall Cognitive Status: Within Functional Limits for tasks assessed                                        Exercises General Exercises - Lower Extremity Ankle Circles/Pumps: AROM;Both;10  reps;Supine Quad Sets: AROM;Both;10 reps;Supine Gluteal Sets: AROM;Both;10 reps;Supine    General Comments        Pertinent Vitals/Pain Pain Assessment: No/denies pain    Home Living                      Prior Function            PT Goals (current goals can now be found in the care plan section) Acute Rehab PT Goals Patient Stated Goal: Start trying to do more for myself. PT Goal Formulation: With patient Time For Goal Achievement: 10/08/20 Potential to Achieve Goals: Good Progress towards PT goals: Progressing toward goals    Frequency    Min 3X/week      PT Plan Discharge plan needs to be updated    Co-evaluation              AM-PAC PT "6 Clicks" Mobility   Outcome Measure  Help needed turning from your back to your side while in a flat bed without using bedrails?: None Help needed moving from lying on your back to sitting on the side of a flat bed without using bedrails?: A Little Help needed moving to and from a bed to a chair (including a wheelchair)?: A Little Help needed standing up from a chair using your arms (e.g., wheelchair or bedside chair)?: A Little Help needed to walk in hospital room?: A Little Help needed climbing 3-5 steps with a railing? : A Lot 6 Click Score: 18    End of Session Equipment Utilized During Treatment: Oxygen;Gait belt Activity Tolerance: Patient limited by fatigue Patient left: with call bell/phone within reach;in bed Nurse Communication: Mobility status PT Visit Diagnosis: Unsteadiness on feet (R26.81);Muscle weakness (generalized) (M62.81);Dizziness and giddiness (R42)     Time: 0300-9233 PT Time Calculation (min) (ACUTE ONLY): 44 min  Charges:  $Gait Training: 23-37 mins $Therapeutic Exercise: 8-22 mins                     Asheton Scheffler W,PT Acute Rehabilitation Services Pager:  [phone]  Office:  [place] 09/24/2020, 2:46 PM

## 2020-09-24 NOTE — Progress Notes (Signed)
PROGRESS NOTE    [name]  PPJ:[id number] DOB: 19-Aug-1946 DOA: 08/25/2020 PCP: [name], MD (Confirm with patient/family/NH records and if not entered, this HAS to be entered at [facility] point of entry. "No PCP" if truly none.)   Brief Narrative: (Start on day 1 of progress note - keep it brief and live) Patient is a 74 year old Caucasian male with history of hypertension, hyperlipidemia, atrial fibrillation, coronary artery disease s/p LAD PCI, chronic kidney disease stage III and chronic systolic heart failure presented to hospital for worsening shortness of breath, cough with nasal congestion and severe generalized weakness and found to have acute hypoxic respiratory failure secondary to Covid pneumonia and acute on chronic systolic congestive heart failure. Patient also found to have bilateral lower extremity DVT.Patient is on Eliquis for DVT.  Plan was to discharge patient home today with home services and walk test ordered to find out oxygen requirement.  Patient became severely hypoxic when change his position from laying to sitting and oxygen saturation dropped to 85%.  Oxygen saturation was 80% on room air and needed 10 L of oxygen with exertion to maintain oxygen saturation of 90%.  Patient could not be discharge today because of worsening hypoxia and high amount of oxygen requirement with exertion.  Patient will be discharged home with home health services.  Assessment & Plan:   Principal Problem:   Pneumonia due to COVID-19 virus Active Problems:   Essential hypertension   CAD S/P LAD PCI- 2000   Atrial fibrillation (HCC)   Chronic systolic heart failure (HCC)   Sleep apnea   Nonischemic cardiomyopathy (HCC)   CKD (chronic kidney disease) stage 3, GFR 30-59 ml/min (HCC)   Acute respiratory failure with hypoxia (HCC)   Hyperlipidemia   Acute hypoxic respiratory failure secondary to COVID-19 pneumonia and acute on chronic systolic CHF exacerbation. Patient was very  comfortable laying on the bed with 2 L of oxygen but became severely hypoxic and dyspneic when changed his position from laying to sitting.  Walk test was done and oxygen saturation dropped to 80% on room air at rest and required 10 L of oxygen with nasal cannula to improve the oxygen saturation to 90% with exertion.Goal is to wean off oxygen by maintaining oxygen saturations within normal limits. Patient already completed remdesivir and steroid course for Covid pneumonia. Patient is no more on airborne precautions because it is more than 21 days since Covid positive. Patient was also in acute on chronic CHF exacerbation. Continue Lasix p.o. 40 mg twice daily along with digoxin and Toprol. Daily weights with intake/output monitoring ordered. Oxygen supplementation as needed maintain oxygen saturation within normal limits. Echocardiogram showed ejection fraction of 25 to 30% with regional wall motion abnormalities similar to the previous echocardiogram. Patient is evaluated by PT/OT who recommended SNF at this time.Patient and  his wife declined SNF and decided to go home with home health PT. patient will most probably need oxygen supplementation at the time of discharge.  COVID-19 pneumonia See above  Acute on chronic systolic CHF exacerbation s/p ICD See above  Hypokalemia Potassiumis 3.3 today.  Potassium supplementation ordered. Continue to monitor BMP in the morning  New onset bilateral lower extremity DVT Continue Eliquis. Chest CT was also done and was negative for pulmonary embolism.  Acute metabolic encephalopathy, resolved Patientis no more acutelyconfusedand athis baseline.  AKI on CKD stage III Creatinine is 1.47today. Continue to monitor  Hypertension Stable with Toprol  Chronic atrial fibrillation Continue Toprol and Eliquis  Hyperlipidemia Continue homepravastatin  Coronary artery disease Stable. Continue home Toprol, statin and  Eliquis  Oral thrush, improved Patient completed Diflucan course      DVT prophylaxis: Eliquis Code Status: Full code Family Communication: Patient's wife present at the bedside Disposition Plan: Home with home health services.   Consultants:   none  Procedures: (Don't include imaging studies which can be auto populated. Include things that cannot be auto populated i.e. Echo, Carotid and venous dopplers, Foley, Bipap, HD, tubes/drains, wound vac, central lines etc) Echocardiogram Duplex venous ultrasound of lower extremities  Antimicrobials: (specify start and planned stop date. Auto populated tables are space occupying and do not give end dates)     Subjective: Patient is seen and evaluated at the bedside today.  Patient's wife is also present in the bedside.  Patient is complaining of weakness and shortness of breath with mild exertion.  Patient otherwise denies fever, chills, chest pain, nausea, vomiting, abdominal pain and urinary symptoms.  Objective: Vitals:   09/23/20 1810 09/23/20 2007 09/24/20 0423 09/24/20 1236  BP: 103/62 111/60 121/81 104/60  Pulse: 73 71 72 72  Resp: 18 18 18 18   Temp: 98.5 F (36.9 C) (!) 97.3 F (36.3 C) (!) 97.5 F (36.4 C) 98 F (36.7 C)  TempSrc: Oral Oral Oral Oral  SpO2: 91%  95% (!) 89%  Weight:      Height:        Intake/Output Summary (Last 24 hours) at 09/24/2020 1555 Last data filed at 09/24/2020 0854 Gross per 24 hour  Intake 540 ml  Output 850 ml  Net -310 ml   Filed Weights   09/21/20 0441 09/22/20 0419 09/23/20 0525  Weight: 80.6 kg 80.3 kg 79.4 kg    Examination:  General exam:Appears calm and comfortable  Respiratory system:Patient is on2L of oxygen with nasal cannula. Diminished breath sounds in bilateral lung bases. No wheezes appreciated onauscultation. Cardiovascular system:Irregularly irregular rhythm with normal rate. No JVD, murmurs, rubs, gallops or clicks. No pedal  edema. Gastrointestinal system:Abdomen is nondistended, soft and nontender. No organomegaly or masses felt. Normal bowel sounds heard. Central nervous system:Alert and oriented. No focal neurological deficits. Extremities:Muscular strength is 4/5 in bilateral lower extremities Skin: No rashes, lesions or ulcers Psychiatry:Judgement and insight appear normal. Mood &affect appropriate..     Data Reviewed: I have personally reviewed following labs and imaging studies  CBC: Recent Labs  Lab 09/20/20 0340 09/22/20 0536 09/23/20 0952 09/24/20 0619  WBC 8.2 8.3 8.7 9.1  HGB 10.4* 11.8* 11.8* 12.1*  HCT 34.1* 38.0* 38.9* 38.7*  MCV 82.4 82.8 81.7 82.2  PLT 264 340 237 751   Basic Metabolic Panel: Recent Labs  Lab 09/20/20 0340 09/21/20 0414 09/22/20 0536 09/23/20 0731 09/24/20 0619  NA 141 142 142 141 142  K 3.7 3.4* 3.6 3.8 3.3*  CL 104 101 99 99 99  CO2 27 30 30 27 31   GLUCOSE 92 88 88 88 95  BUN 21 22 21 16 21   CREATININE 1.06 1.34* 1.32* 1.40* 1.47*  CALCIUM 9.4 9.5 9.5 9.4 9.4   GFR: Estimated Creatinine Clearance: 46.9 mL/min (A) (by C-G formula based on SCr of 1.47 mg/dL (H)). Liver Function Tests: No results for input(s): AST, ALT, ALKPHOS, BILITOT, PROT, ALBUMIN in the last 168 hours. No results for input(s): LIPASE, AMYLASE in the last 168 hours. No results for input(s): AMMONIA in the last 168 hours. Coagulation Profile: No results for input(s): INR, PROTIME in the last 168 hours. Cardiac Enzymes: No  results for input(s): CKTOTAL, CKMB, CKMBINDEX, TROPONINI in the last 168 hours. BNP (last 3 results) No results for input(s): PROBNP in the last 8760 hours. HbA1C: No results for input(s): HGBA1C in the last 72 hours. CBG: No results for input(s): GLUCAP in the last 168 hours. Lipid Profile: No results for input(s): CHOL, HDL, LDLCALC, TRIG, CHOLHDL, LDLDIRECT in the last 72 hours. Thyroid Function Tests: No results for input(s): TSH, T4TOTAL, FREET4,  T3FREE, THYROIDAB in the last 72 hours. Anemia Panel: No results for input(s): VITAMINB12, FOLATE, FERRITIN, TIBC, IRON, RETICCTPCT in the last 72 hours. Sepsis Labs: No results for input(s): PROCALCITON, LATICACIDVEN in the last 168 hours.  No results found for this or any previous visit (from the past 240 hour(s)).       Radiology Studies: No results found.      Scheduled Meds: . apixaban  5 mg Oral BID  . vitamin C  500 mg Oral Daily  . digoxin  0.125 mg Oral Daily  . fenofibrate  160 mg Oral Daily  . ferrous sulfate  325 mg Oral BID  . folic acid  1 mg Oral QPM  . furosemide  40 mg Oral BID  . hydrocortisone  25 mg Rectal BID  . metoprolol succinate  25 mg Oral Daily  . oxymetazoline  1 spray Each Nare BID  . pantoprazole  40 mg Oral BID  . pravastatin  40 mg Oral Daily  . saline  1 application Each Nare P5V  . sertraline  150 mg Oral Q1400  . sodium chloride  1 spray Each Nare Q6H  . zinc sulfate  220 mg Oral Daily   Continuous Infusions: . sodium chloride 250 mL (09/13/20 0524)     LOS: 29 days    Time spent:     Edmonia Lynch, MD [facility] Pager [phone]  If 7PM-7AM, please contact night-coverage [email or web address] Password [email or web address] 09/24/2020, 3:55 PM

## 2020-09-24 NOTE — Plan of Care (Signed)
  Problem: Clinical Measurements: Goal: Respiratory complications will improve Outcome: Progressing Goal: Cardiovascular complication will be avoided Outcome: Progressing   Problem: Nutrition: Goal: Adequate nutrition will be maintained Outcome: Progressing   Problem: Coping: Goal: Level of anxiety will decrease Outcome: Progressing   Problem: Safety: Goal: Ability to remain free from injury will improve Outcome: Progressing

## 2020-09-24 NOTE — Progress Notes (Signed)
SATURATION QUALIFICATIONS: (This note is used to comply with regulatory documentation for home oxygen)  Patient Saturations on Room Air at Rest = 80%  Patient Saturations on Room Air while Ambulating = unable as pt desats on RA at rest%  Patient Saturations on 10 Liters of oxygen while Ambulating = 86%  Please briefly explain why patient needs home oxygen:Pt needs O2 at rest and with activity to maintain sats.  Chemeka Filice W,PT Acute Rehabilitation Services Pager:  [phone]  Office:  [phone]

## 2020-09-25 DIAGNOSIS — U071 COVID-19: Secondary | ICD-10-CM | POA: Diagnosis not present

## 2020-09-25 DIAGNOSIS — J1282 Pneumonia due to Coronavirus disease 2019: Secondary | ICD-10-CM | POA: Diagnosis not present

## 2020-09-25 LAB — BASIC METABOLIC PANEL
Anion gap: 12 (ref 5–15)
BUN: 23 mg/dL (ref 8–23)
CO2: 31 mmol/L (ref 22–32)
Calcium: 9.4 mg/dL (ref 8.9–10.3)
Chloride: 97 mmol/L — ABNORMAL LOW (ref 98–111)
Creatinine, Ser: 1.46 mg/dL — ABNORMAL HIGH (ref 0.61–1.24)
GFR calc non Af Amer: 47 mL/min — ABNORMAL LOW (ref >60)
Glucose, Bld: 108 mg/dL — ABNORMAL HIGH (ref 70–99)
Potassium: 3 mmol/L — ABNORMAL LOW (ref 3.5–5.1)
Sodium: 140 mmol/L (ref 135–145)

## 2020-09-25 LAB — CBC
HCT: 38.9 % — ABNORMAL LOW (ref 39.0–52.0)
Hemoglobin: 11.9 g/dL — ABNORMAL LOW (ref 13.0–17.0)
MCH: 25 pg — ABNORMAL LOW (ref 26.0–34.0)
MCHC: 30.6 g/dL (ref 30.0–36.0)
MCV: 81.7 fL (ref 80.0–100.0)
Platelets: 372 10*3/uL (ref 150–400)
RBC: 4.76 MIL/uL (ref 4.22–5.81)
RDW: 16.6 % — ABNORMAL HIGH (ref 11.5–15.5)
WBC: 8.4 10*3/uL (ref 4.0–10.5)
nRBC: 0 % (ref 0.0–0.2)

## 2020-09-25 MED ORDER — POTASSIUM CHLORIDE CRYS ER 20 MEQ PO TBCR
40.0000 meq | EXTENDED_RELEASE_TABLET | Freq: Two times a day (BID) | ORAL | Status: AC
  Administered 2020-09-25 (×2): 40 meq via ORAL
  Filled 2020-09-25 (×2): qty 2

## 2020-09-25 NOTE — Progress Notes (Signed)
PROGRESS NOTE    [name]  RDE:[id number] DOB: 04-10-1946 DOA: 08/25/2020 PCP: [name], MD (Confirm with patient/family/NH records and if not entered, this HAS to be entered at [facility] point of entry. "No PCP" if truly none.)   Brief Narrative: (Start on day 1 of progress note - keep it brief and live) Patient is a 74 year old Caucasian male with history of hypertension, hyperlipidemia, atrial fibrillation, coronary artery disease s/p LAD PCI, chronic kidney disease stage III and chronic systolic heart failure presented to hospital for worsening shortness of breath, cough with nasal congestion and severe generalized weakness and found to have acute hypoxic respiratory failure secondary to Covid pneumonia and acute on chronic systolic congestive heart failure. Patient also found to have bilateral lower extremity DVT.Patient is on Eliquis for DVT.  Plan was to discharge patient home  with home services and walk test ordered to find out oxygen requirement.  Patient became severely hypoxic when change his position from laying to sitting and oxygen saturation dropped to 85%.  Oxygen saturation was 80% on room air and needed 10 L of oxygen with exertion to maintain oxygen saturation of 90%.  Patient could not be discharge because of worsening dyspnea with mild exertion and requirement of high amount of oxygen.  Patient will be discharged to home once his oxygen requirement decreased to 3 to 4 L with exertion.  Patient will be discharged home with home oxygen.  Assessment & Plan:   Principal Problem:   Pneumonia due to COVID-19 virus Active Problems:   Essential hypertension   CAD S/P LAD PCI- 2000   Atrial fibrillation (HCC)   Chronic systolic heart failure (HCC)   Sleep apnea   Nonischemic cardiomyopathy (HCC)   CKD (chronic kidney disease) stage 3, GFR 30-59 ml/min (HCC)   Acute respiratory failure with hypoxia (HCC)   Hyperlipidemia   Acute hypoxic respiratory failure secondary  to COVID-19 pneumonia and acute on chronic systolic CHF exacerbation. Patient was very comfortable laying on the bed with 2 L of oxygen but again became severely hypoxic and dyspneic when  tried to set him up.  Walk test was done and oxygen saturation dropped to 80% on room air at rest 2 days ago and required 10 L of oxygen with nasal cannula to improve the oxygen saturation to 90% with exertion.Goal is to wean off oxygen by maintaining oxygen saturations within normal limits. Patient  completed remdesivir and steroid course for Covid pneumonia. Patient is no more on airborne precautions because it is more than 21 days since Covid positive. Patient was also in acute on chronic CHF exacerbation. Continue Lasix p.o. 40 mg twice daily along with digoxin and Toprol. Daily weights with intake/output monitoring ordered. Oxygen supplementation as needed maintain oxygen saturation within normal limits. Echocardiogram showed ejection fraction of 25 to 30% with regional wall motion abnormalities similar to the previous echocardiogram. Patient is evaluated by PT/OT who recommendedSNF at this time.Patient andhis wife declined SNF and decided to go home with home health PT.  Plan is to do the walk test again tomorrow if his condition improve tomorrow and will be discharged home if oxygen requirement dropped to less than 5 L on exertion.  COVID-19 pneumonia See above  Acute on chronic systolic CHF exacerbation s/p ICD See above  Hypokalemia Potassiumis 3.0today.   80 mEq of oral Potassium supplementation ordered. Continue to monitor BMP in the morning  New onset bilateral lower extremity DVT Continue Eliquis. Chest CT was also done and  was negative for pulmonary embolism.  Acute metabolic encephalopathy, resolved Patientis no more acutelyconfusedand athis baseline.  AKI on CKD stage III Creatinine is 1.46today.  It is most probably the new baseline for patient. Continue to  monitor  Hypertension Blood pressure remained stable with Toprol  Chronic atrial fibrillation Continue Toprol and Eliquis  Hyperlipidemia Continue homepravastatin  Coronary artery disease Stable. Continue home Toprol, statin and Eliquis  Oral thrush, improved Patient completed Diflucan course     DVT prophylaxis: Eliquis Code Status: Full code Family Communication: Wife present at bedside Disposition Plan: Plan is to discharge the patient home with home health services   Consultants:   None  Procedures: (Don't include imaging studies which can be auto populated. Include things that cannot be auto populated i.e. Echo, Carotid and venous dopplers, Foley, Bipap, HD, tubes/drains, wound vac, central lines etc)  Echocardiogram  Bilateral lower extremities venous ultrasound  Antimicrobials: (specify start and planned stop date. Auto populated tables are space occupying and do not give end dates)     Subjective: Patient is seen and evaluated at the bedside today.  Patient is on 2 L of oxygen at this time.  Patient states that he feels comfortable while laying on the bed but become severely dyspneic even with mild exertion.  Patient otherwise denies fever, chills, chest pain, palpitations, nausea, vomiting, abdominal pain, diarrhea, constipation and urinary symptoms.  Objective: Vitals:   09/24/20 2009 09/25/20 0425 09/25/20 0943 09/25/20 1139  BP: 97/63 111/61 104/67 101/67  Pulse: 78 71 71 69  Resp: 18 18  17   Temp: 98 F (36.7 C) 97.7 F (36.5 C) 97.6 F (36.4 C) 98 F (36.7 C)  TempSrc: Oral Oral Oral   SpO2: 97% 97% 98% 96%  Weight:  79.8 kg    Height:        Intake/Output Summary (Last 24 hours) at 09/25/2020 1347 Last data filed at 09/25/2020 1258 Gross per 24 hour  Intake 360 ml  Output 276 ml  Net 84 ml   Filed Weights   09/22/20 0419 09/23/20 0525 09/25/20 0425  Weight: 80.3 kg 79.4 kg 79.8 kg    Examination:  General exam: Appears  calm and comfortable  Respiratory system: Patient remains on 2 L of oxygen with nasal cannula.  Coarse and diminished breath sounds in bilateral lung bases.  No wheezes or rails appreciated on auscultation. Respiratory effort normal. Cardiovascular system: S1 & S2 heard, RRR. No JVD, murmurs, rubs, gallops or clicks. No pedal edema. Gastrointestinal system: Abdomen is nondistended, soft and nontender. No organomegaly or masses felt. Normal bowel sounds heard. Central nervous system: Alert and oriented. No focal neurological deficits.  Muscle strength is 4/5 in both legs while 5/5 in both upper extremities.  Sensations intact throughout. Extremities: Symmetric  Skin: No rashes, lesions or ulcers Psychiatry: Judgement and insight appear normal. Mood & affect appropriate.     Data Reviewed: I have personally reviewed following labs and imaging studies  CBC: Recent Labs  Lab 09/20/20 0340 09/22/20 0536 09/23/20 0952 09/24/20 0619 09/25/20 0324  WBC 8.2 8.3 8.7 9.1 8.4  HGB 10.4* 11.8* 11.8* 12.1* 11.9*  HCT 34.1* 38.0* 38.9* 38.7* 38.9*  MCV 82.4 82.8 81.7 82.2 81.7  PLT 264 340 237 375 409   Basic Metabolic Panel: Recent Labs  Lab 09/21/20 0414 09/22/20 0536 09/23/20 0731 09/24/20 0619 09/25/20 0324  NA 142 142 141 142 140  K 3.4* 3.6 3.8 3.3* 3.0*  CL 101 99 99 99 97*  CO2  30 30 27 31 31   GLUCOSE 88 88 88 95 108*  BUN 22 21 16 21 23   CREATININE 1.34* 1.32* 1.40* 1.47* 1.46*  CALCIUM 9.5 9.5 9.4 9.4 9.4   GFR: Estimated Creatinine Clearance: 47.2 mL/min (A) (by C-G formula based on SCr of 1.46 mg/dL (H)). Liver Function Tests: No results for input(s): AST, ALT, ALKPHOS, BILITOT, PROT, ALBUMIN in the last 168 hours. No results for input(s): LIPASE, AMYLASE in the last 168 hours. No results for input(s): AMMONIA in the last 168 hours. Coagulation Profile: No results for input(s): INR, PROTIME in the last 168 hours. Cardiac Enzymes: No results for input(s): CKTOTAL,  CKMB, CKMBINDEX, TROPONINI in the last 168 hours. BNP (last 3 results) No results for input(s): PROBNP in the last 8760 hours. HbA1C: No results for input(s): HGBA1C in the last 72 hours. CBG: No results for input(s): GLUCAP in the last 168 hours. Lipid Profile: No results for input(s): CHOL, HDL, LDLCALC, TRIG, CHOLHDL, LDLDIRECT in the last 72 hours. Thyroid Function Tests: No results for input(s): TSH, T4TOTAL, FREET4, T3FREE, THYROIDAB in the last 72 hours. Anemia Panel: No results for input(s): VITAMINB12, FOLATE, FERRITIN, TIBC, IRON, RETICCTPCT in the last 72 hours. Sepsis Labs: No results for input(s): PROCALCITON, LATICACIDVEN in the last 168 hours.  No results found for this or any previous visit (from the past 240 hour(s)).       Radiology Studies: No results found.      Scheduled Meds: . apixaban  5 mg Oral BID  . vitamin C  500 mg Oral Daily  . digoxin  0.125 mg Oral Daily  . fenofibrate  160 mg Oral Daily  . ferrous sulfate  325 mg Oral BID  . folic acid  1 mg Oral QPM  . furosemide  40 mg Oral BID  . hydrocortisone  25 mg Rectal BID  . metoprolol succinate  25 mg Oral Daily  . oxymetazoline  1 spray Each Nare BID  . pantoprazole  40 mg Oral BID  . potassium chloride  40 mEq Oral BID  . pravastatin  40 mg Oral Daily  . saline  1 application Each Nare X7L  . sertraline  150 mg Oral Q1400  . sodium chloride  1 spray Each Nare Q6H  . zinc sulfate  220 mg Oral Daily   Continuous Infusions: . sodium chloride 250 mL (09/13/20 0524)     LOS: 30 days    Time spent:     Edmonia Lynch, MD [facility] Pager [phone]  If 7PM-7AM, please contact night-coverage [email or web address] Password [email or web address] 09/25/2020, 1:47 PM

## 2020-09-25 NOTE — Progress Notes (Signed)
Occupational Therapy Treatment Patient Details Name: [name] MRN: [id number] DOB: Jun 30, 1946 Today's Date: 09/25/2020    History of present illness [name] is a 74 y.o. male with medical history significant of nonischemic cardiomyopathy with ejection fraction of 25-30%, status post ICD placement, permanent A. fib/atrial flutter-on Coumadin, hypertension, hyperlipidemia, depression/anxiety, GERD, presents to emergency department with worsening shortness of breath and cough since 1 week. Tested pos for covid.    OT comments  Spent an extended period of time with pt. He was quite confused about staff's request for him to spend time in the chair, use of IS and when it is appropriate for him to utilize pursed lip breathing strategies. Pt educated in optimal Sp02 and titrating 02 to help him recover from exertion. Wife called during session and reinforced to her as well. Pt voicing frustration that he does not feel that he has been informed about discharge planning. Informed pt that his insurance declined CIR, he refuses SNF and now arrangements must be made for [name] including DME and 02 delivery. Will continue to follow acutely, updated d/c to [name].    Follow Up Recommendations  Home health OT;Supervision/Assistance - 24 hour    Equipment Recommendations  3 in 1 bedside commode;Wheelchair (measurements OT);Wheelchair cushion (measurements OT)    Recommendations for Other Services      Precautions / Restrictions Precautions Precautions: Fall Precaution Comments: watch 02       Mobility Bed Mobility Overal bed mobility: Needs Assistance Bed Mobility: Supine to Sit     Supine to sit: Min assist     General bed mobility comments: got up to L side of bed today, pulled up on therapist's hand  Transfers Overall transfer level: Needs assistance Equipment used: Rolling walker (2 wheeled) Transfers: Sit to/from [facility] Sit to Stand: Min guard;From elevated  surface Stand pivot transfers: Min guard       General transfer comment: cues for hand placement    Balance Overall balance assessment: Needs assistance   Sitting balance-[name] Scale: Good       Standing balance-[name] Scale: Poor Standing balance comment: reliant on B UE support                           ADL either performed or assessed with clinical judgement   ADL Overall ADL's : Needs assistance/impaired Eating/Feeding: Independent;Sitting   Grooming: Wash/dry hands;Wash/dry face;Set up;Sitting                                 General ADL Comments: Instructed in use of IS 10x per hour, importance of home monitoring of HR and 02, how to titrate 02 use to aid in recovery, pursed lip breathing when dyspneic     Vision       Perception     Praxis      Cognition Arousal/Alertness: Awake/alert Behavior During Therapy: WFL for tasks assessed/performed Overall Cognitive Status: Impaired/Different from baseline Area of Impairment: Memory;Problem solving                     Memory: Decreased short-term memory       Problem Solving: Slow processing General Comments: Sp02 drops to 81% on 2L, recovered to 90% 4L        Exercises     Shoulder Instructions       General Comments  Pertinent Vitals/ Pain       Pain Assessment: No/denies pain  Home Living                                          Prior Functioning/Environment              Frequency  Min 2X/week        Progress Toward Goals  OT Goals(current goals can now be found in the care plan section)  Progress towards OT goals: Progressing toward goals  Acute Rehab OT Goals Patient Stated Goal: Start trying to do more for myself. OT Goal Formulation: With patient Time For Goal Achievement: 10/05/20 Potential to Achieve Goals: Good  Plan Discharge plan needs to be updated    Co-evaluation                 AM-PAC OT "6 Clicks"  Daily Activity     Outcome Measure   Help from another person eating meals?: None Help from another person taking care of personal grooming?: A Little Help from another person toileting, which includes using toliet, bedpan, or urinal?: A Lot Help from another person bathing (including washing, rinsing, drying)?: A Lot Help from another person to put on and taking off regular upper body clothing?: A Little Help from another person to put on and taking off regular lower body clothing?: A Lot 6 Click Score: 16    End of Session Equipment Utilized During Treatment: Rolling walker;Oxygen (2-4L)  OT Visit Diagnosis: Unsteadiness on feet (R26.81);Muscle weakness (generalized) (M62.81)   Activity Tolerance Patient limited by fatigue   Patient Left in chair;with call bell/phone within reach   Nurse Communication          Time: 3383-2919 OT Time Calculation (min): 81 min  Charges: OT General Charges $OT Visit: 1 Visit OT Treatments $Self Care/Home Management : 68-82 mins  [name], OTR/L Acute Rehabilitation Services Pager: [phone] Office: [phone]   [name] 09/25/2020, 12:44 PM

## 2020-09-26 ENCOUNTER — Inpatient Hospital Stay (HOSPITAL_COMMUNITY): Payer: Medicare Other

## 2020-09-26 DIAGNOSIS — J1282 Pneumonia due to Coronavirus disease 2019: Secondary | ICD-10-CM | POA: Diagnosis not present

## 2020-09-26 DIAGNOSIS — U071 COVID-19: Secondary | ICD-10-CM | POA: Diagnosis not present

## 2020-09-26 LAB — CBC
HCT: 38 % — ABNORMAL LOW (ref 39.0–52.0)
Hemoglobin: 11.5 g/dL — ABNORMAL LOW (ref 13.0–17.0)
MCH: 24.9 pg — ABNORMAL LOW (ref 26.0–34.0)
MCHC: 30.3 g/dL (ref 30.0–36.0)
MCV: 82.3 fL (ref 80.0–100.0)
Platelets: 385 10*3/uL (ref 150–400)
RBC: 4.62 MIL/uL (ref 4.22–5.81)
RDW: 16.5 % — ABNORMAL HIGH (ref 11.5–15.5)
WBC: 7.8 10*3/uL (ref 4.0–10.5)
nRBC: 0 % (ref 0.0–0.2)

## 2020-09-26 LAB — URINALYSIS, COMPLETE (UACMP) WITH MICROSCOPIC
Bilirubin Urine: NEGATIVE
Glucose, UA: NEGATIVE mg/dL
Ketones, ur: NEGATIVE mg/dL
Nitrite: NEGATIVE
Protein, ur: NEGATIVE mg/dL
Specific Gravity, Urine: 1.02 (ref 1.005–1.030)
WBC, UA: >50 WBC/hpf (ref 0–5)
pH: 5 (ref 5.0–8.0)

## 2020-09-26 LAB — BASIC METABOLIC PANEL
Anion gap: 11 (ref 5–15)
BUN: 21 mg/dL (ref 8–23)
CO2: 32 mmol/L (ref 22–32)
Calcium: 9.2 mg/dL (ref 8.9–10.3)
Chloride: 99 mmol/L (ref 98–111)
Creatinine, Ser: 1.52 mg/dL — ABNORMAL HIGH (ref 0.61–1.24)
GFR, Estimated: 45 mL/min — ABNORMAL LOW (ref >60)
Glucose, Bld: 96 mg/dL (ref 70–99)
Potassium: 3.7 mmol/L (ref 3.5–5.1)
Sodium: 142 mmol/L (ref 135–145)

## 2020-09-26 LAB — URINALYSIS, ROUTINE W REFLEX MICROSCOPIC
Bilirubin Urine: NEGATIVE
Glucose, UA: NEGATIVE mg/dL
Ketones, ur: NEGATIVE mg/dL
Nitrite: NEGATIVE
Protein, ur: NEGATIVE mg/dL
Specific Gravity, Urine: 1.012 (ref 1.005–1.030)
pH: 5 (ref 5.0–8.0)

## 2020-09-26 MED ORDER — METOPROLOL SUCCINATE ER 25 MG PO TB24
12.5000 mg | ORAL_TABLET | Freq: Every day | ORAL | Status: DC
  Administered 2020-09-27 – 2020-09-28 (×2): 12.5 mg via ORAL
  Filled 2020-09-26 (×2): qty 1

## 2020-09-26 MED ORDER — SODIUM CHLORIDE 0.9 % IV SOLN
1.0000 g | INTRAVENOUS | Status: DC
  Administered 2020-09-26 – 2020-09-27 (×2): 1 g via INTRAVENOUS
  Filled 2020-09-26 (×2): qty 10

## 2020-09-26 MED ORDER — SACCHAROMYCES BOULARDII 250 MG PO CAPS
250.0000 mg | ORAL_CAPSULE | Freq: Two times a day (BID) | ORAL | Status: DC
  Administered 2020-09-26 – 2020-09-28 (×4): 250 mg via ORAL
  Filled 2020-09-26 (×4): qty 1

## 2020-09-26 MED ORDER — METOPROLOL SUCCINATE ER 25 MG PO TB24: 12.5000 mg | ORAL_TABLET | Freq: Every day | ORAL | Status: DC

## 2020-09-26 NOTE — Plan of Care (Signed)
  Problem: Clinical Measurements: Goal: Ability to maintain clinical measurements within normal limits will improve 09/26/2020 0435 by Marcos Eke, RN Outcome: Progressing 09/26/2020 0140 by Marcos Eke, RN Outcome: Progressing   Problem: Health Behavior/Discharge Planning: Goal: Ability to manage health-related needs will improve 09/26/2020 0435 by Marcos Eke, RN Outcome: Progressing 09/26/2020 0140 by Marcos Eke, RN Outcome: Progressing   Problem: Education: Goal: Knowledge of General Education information will improve Description: Including pain rating scale, medication(s)/side effects and non-pharmacologic comfort measures Outcome: Progressing

## 2020-09-26 NOTE — TOC Progression Note (Signed)
Transition of Care [facility]) - Progression Note    Patient Details  Name: [name] MRN: [id number] Date of Birth: 1946/05/07  Transition of Care [facility]) CM/SW [place], [place] Phone Number: 09/26/2020, 1:52 PM  Clinical Narrative:     CSW met with patient and his spouse,Karen. CSW introduced self and explained role. CSW discussed with patient and his wife the SNF process and answered questions regarding SNF placement. Patient/Spouse expressed appreciation for CSW assistance  and decided to discharge home with home health. They both shared concerns about  discharging from the hospital today (because of oxygen levels ). CSW advised to share concerns with MD. Patient/spouse, states no further questions or concerns at this time.  CSW updated RN and RNCM.    Thurmond Butts, MSW, [place] Clinical Social Worker   Expected Discharge Plan: [place] Barriers to Discharge: Continued Medical Work up  Expected Discharge Plan and Services Expected Discharge Plan: [place] In-house Referral: NA Discharge Planning Services: CM Consult Post Acute Care Choice: NA Living arrangements for the past 2 months: Single Family Home Expected Discharge Date: 09/26/20                 DME Agency: NA       HH Arranged: PT [place] Agency: [facility] [employer] Date [place]: 09/02/20 Time [place]: 1000 Representative spoke with at [place]: [place] ([place]) Interventions    Readmission Risk Interventions Readmission Risk Prevention Plan 08/31/2020  Transportation Screening Complete  Medication Review Press photographer) Complete  HRI or [place] Not Applicable  Some recent data might be hidden

## 2020-09-26 NOTE — Plan of Care (Signed)
  Problem: Coping: Goal: Level of anxiety will decrease Outcome: Progressing   Problem: Clinical Measurements: Goal: Will remain free from infection Outcome: Progressing   Problem: Clinical Measurements: Goal: Ability to maintain clinical measurements within normal limits will improve Outcome: Progressing   Problem: Health Behavior/Discharge Planning: Goal: Ability to manage health-related needs will improve Outcome: Progressing

## 2020-09-26 NOTE — Progress Notes (Signed)
PROGRESS NOTE    [name]  RAQ:[id number] DOB: 1946/08/09 DOA: 08/25/2020 PCP: [name], MD    Chief Complaint  Patient presents with  . Shortness of Breath    Brief Narrative:  Patient is a 74 year old Caucasian male with history of hypertension, hyperlipidemia, atrial fibrillation, coronary artery disease s/p LAD PCI, chronic kidney disease stage III and chronic systolic heart failure presented to hospital for worsening shortness of breath, cough with nasal congestion and severe generalized weakness and found to have acute hypoxic respiratory failure secondary to Covid pneumonia and acute on chronic systolic congestive heart failure. Patient also found to have bilateral lower extremity DVT.Patient is on Eliquis for DVT.Plan was to discharge patient home  with home services and walk test ordered to find out oxygen requirement. Patient became severely hypoxic when change his position from laying to sitting and oxygen saturation dropped to 85%. Oxygen saturation was 80% on room air and needed 10 L of oxygen with exertion to maintain oxygen saturation of 90%. Patient could not be discharge because of worsening dyspnea with mild exertion and requirement of high amount of oxygen.    Subjective:  02 dropped to 84% on 4liter o2,   Cr trend up, blood pressure trend low  Assessment & Plan:   Principal Problem:   Pneumonia due to COVID-19 virus Active Problems:   Essential hypertension   CAD S/P LAD PCI- 2000   Atrial fibrillation (HCC)   Chronic systolic heart failure (HCC)   Sleep apnea   Nonischemic cardiomyopathy (HCC)   CKD (chronic kidney disease) stage 3, GFR 30-59 ml/min (HCC)   Acute respiratory failure with hypoxia (HCC)   Hyperlipidemia   Acute hypoxic respiratory failure secondary to COVID-19 pneumonia and acute on chronic systolic CHF exacerbation -Continue require higher amount of oxygen with minimal exertion, will check chest x-ray  COVID-19  pneumonia Patient  completed remdesivir and steroid course for Covid pneumonia. Patient is no more on airborne precautions because it is more than 21 days since Covid positive Due to persistent oxygen requirement, will check ESR CRP, repeat chest x-ray  Acute on chronic systolic CHF exacerbation s/p ICD He received aggressive diuresis during hospital course ,currently appear euvolemic to dry, with borderline blood pressure, and creatinine trend up, will hold Lasix for now  New onset bilateral lower extremity DVT Continue Eliquis. Chest CT was also done and was negative for pulmonary embolism.  Acute metabolic encephalopathy, resolved Patientis no more acutelyconfusedand athis baseline.  AKI on CKD stage III Creatinine 1.06 on October 3, creatinine slowly trending up to 1.52 today -Today he report dysuria, UA concerning for UTI, urine culture obtained -Empirically start Rocephin, follow-up urine culture -Hold Lasix and losartan  Hypertension Blood pressure low normal, decrease metoprolol, hold Lasix Hold losartan  Chronic atrial fibrillation Paced rhythm Continue Toprol and Eliquis  Coronary artery disease Stable. Continue home Toprol, digoxin ,statin and Eliquis  Oral thrush, improved Patient completed Diflucan course  FTT: CIR placement denied by insurance, patient declined skilled nursing facility placement, will need [facility] home health   DVT prophylaxis: SCDs Start: 08/26/20 1045 apixaban (ELIQUIS) tablet 5 mg   Code Status: Full Family Communication: Wife over the phone Disposition:   Status is: Inpatient   Dispo: The patient is from: home              Anticipated d/c is to: home with home health, patient declined snf placement  Anticipated d/c date is: tomorrow if cr improves,               Patient currently not medically ready to discharge  Consultants:   None  Procedures:   None  Antimicrobials:    Rocephin     Objective: Vitals:   09/26/20 1134 09/26/20 1138 09/26/20 1141 09/26/20 1215  BP:   (!) 97/50   Pulse: 72 69 70   Resp:      Temp:      TempSrc:      SpO2: 96% (!) 86% 90% 94%  Weight:      Height:        Intake/Output Summary (Last 24 hours) at 09/26/2020 1557 Last data filed at 09/26/2020 1300 Gross per 24 hour  Intake 630 ml  Output 300 ml  Net 330 ml   Filed Weights   09/23/20 0525 09/25/20 0425 09/26/20 0500  Weight: 79.4 kg 79.8 kg 79.9 kg    Examination:  General exam: Weak and frail, NAD Respiratory system: Diminished at bases, no wheezing, no rales, no rhonchi . Respiratory effort normal. Cardiovascular system: S1 & S2 heard, paced rhythm. No JVD, no murmur, No pedal edema. Gastrointestinal system: Abdomen is nondistended, soft and nontender.  Normal bowel sounds heard. Central nervous system: Alert and oriented. No focal neurological deficits. Extremities: Equal, generalized weakness Skin: No rashes, lesions or ulcers Psychiatry: Judgement and insight appear normal. Mood & affect appropriate.     Data Reviewed: I have personally reviewed following labs and imaging studies  CBC: Recent Labs  Lab 09/22/20 0536 09/23/20 0952 09/24/20 0619 09/25/20 0324 09/26/20 0658  WBC 8.3 8.7 9.1 8.4 7.8  HGB 11.8* 11.8* 12.1* 11.9* 11.5*  HCT 38.0* 38.9* 38.7* 38.9* 38.0*  MCV 82.8 81.7 82.2 81.7 82.3  PLT 340 237 375 372 116    Basic Metabolic Panel: Recent Labs  Lab 09/22/20 0536 09/23/20 0731 09/24/20 0619 09/25/20 0324 09/26/20 0658  NA 142 141 142 140 142  K 3.6 3.8 3.3* 3.0* 3.7  CL 99 99 99 97* 99  CO2 _0 32  GLUCOSE 88 88 95 108* 96  BUN _1 CREATININE 1.32* 1.40* 1.47* 1.46* 1.52*  CALCIUM 9.5 9.4 9.4 9.4 9.2    GFR: Estimated Creatinine Clearance: 45.4 mL/min (A) (by C-G formula based on SCr of 1.52 mg/dL (H)).  Liver Function Tests: No results for input(s): AST, ALT, ALKPHOS, BILITOT, PROT,  ALBUMIN in the last 168 hours.  CBG: No results for input(s): GLUCAP in the last 168 hours.   No results found for this or any previous visit (from the past 240 hour(s)).       Radiology Studies: No results found.      Scheduled Meds: . apixaban  5 mg Oral BID  . vitamin C  500 mg Oral Daily  . digoxin  0.125 mg Oral Daily  . fenofibrate  160 mg Oral Daily  . ferrous sulfate  325 mg Oral BID  . folic acid  1 mg Oral QPM  . furosemide  40 mg Oral BID  . hydrocortisone  25 mg Rectal BID  . [START ON 09/27/2020] metoprolol succinate  12.5 mg Oral Daily  . oxymetazoline  1 spray Each Nare BID  . pantoprazole  40 mg Oral BID  . pravastatin  40 mg Oral Daily  . saline  1 application Each Nare F7X  . sertraline  150 mg Oral Q1400  . sodium chloride  1 spray Each Nare Q6H  . zinc sulfate  220 mg Oral Daily   Continuous Infusions: . sodium chloride 250 mL (09/13/20 0524)     LOS: 31 days   Time spent: mins Greater than 50% of this time was spent in counseling, explanation of diagnosis, planning of further management, and coordination of care.  I have personally reviewed and interpreted on  09/26/2020 daily labs, tele strips, imagings as discussed above under date review session and assessment and plans.  I reviewed all nursing notes, pharmacy notes, consultant notes,  vitals, pertinent old records  I have discussed plan of care as described above with RN , patient and family on 09/26/2020  Voice Recognition /Dragon dictation system was used to create this note, attempts have been made to correct errors. Please contact the author with questions and/or clarifications.   Florencia Reasons, MD PhD FACP [facility]  Available via Epic secure chat 7am-7pm for nonurgent issues Please page for urgent issues To page the attending provider between 7A-7P or the covering provider during after hours 7P-7A, please log into the web site [email or web address] and access using universal [facility] password for that web site. If you do not have the password, please call the hospital operator.    09/26/2020, 3:57 PM

## 2020-09-26 NOTE — TOC Progression Note (Signed)
Transition of Care [facility]) - Progression Note    Patient Details  Name: KALEP FULL MRN: [id number] Date of Birth: 1946-05-06  Transition of Care [facility]) CM/SW Contact  Konrad Penta, RN Phone Number: [phone] 09/26/2020, 11:09 AM  Clinical Narrative:  Spoke with patient's nurse to confirm home health and home oxygen orders. Informed that it is questionable whether spouse will be able to care for Mr. Aguila at home. SNF was previously declined. Telephone call made to Mrs. Catalfamo to discuss transition plans. Mrs. Sautter states she is very frustrated with "everyone not being on the same page." States she is not sure if she will be able to care for Mr. Encinas at home and that she will come to speak patient about potential SNF placement. Discussed DME orders and home health needs in case member does go home. [facility] arranged. Will need additional orders for OT/aide. Mrs. Longton declines wheelchair. Agreeable to 3 in 1 and oxygen. States she has ordered an adjustable bed that will be delivered on Tuesday. States Mr. Perrault already has rollator. States she will be able to care for Mr. Kallman at home with the least amount of assistance at home. Discussed that writer will follow up with her after she has seen Mr. Carreira. Sent notification to MD, nurse, and LCSW to update about about writer's conversation with Mr. Sida.    Expected Discharge Plan: [place] at Home Barriers to Discharge: Continued Medical Work up  Expected Discharge Plan and Services Expected Discharge Plan: [place] In-house Referral: NA Discharge Planning Services: CM Consult Post Acute Care Choice: NA Living arrangements for the past 2 months: Single Family Home- with spouse Expected Discharge Date: 09/26/20                 DME Agency: NA-- will call [employer] for 3 in 1 and oxygen once transition plans are confirmed.        HH Arranged: PT-- will need OT/aide  services added [place]: [facility] [employer] Date [place]: 09/02/20 Time [place]: 1000 Representative spoke with at [place]: [place] with Olivia Mackie 09/26/20 to confirm [facility] will follow if member returns home.   Marthenia Rolling, MSN, RN,BSN Inpatient [facility] Case Manager [phone]

## 2020-09-26 NOTE — Progress Notes (Signed)
SATURATION QUALIFICATIONS: (This note is used to comply with regulatory documentation for home oxygen)  Patient Saturations on Room Air at Rest = 96%  Patient Saturations on Room Air while Ambulating = 88%  Patient Saturations on 4 Liters of oxygen while Ambulating = 84%  Please briefly explain why patient needs home oxygen:  Patient is alert and oriented x 4 and expresses no chest pain at rest. From the edge of the bed to a standing position, patient desat from 88 to 84. Upon walking from the bed towards the sink, patient appears pale, started becoming short of breathe, patient o2 is 84% on 4L. With another RN, patient went back to bed, oxygen sat is 90% on 6L for patient's comfort. Patient will need home oxygen. Primary RN is aware. Call bell is within reach.

## 2020-09-26 NOTE — TOC Progression Note (Addendum)
Transition of Care [facility]) - Progression Note    Patient Details  Name: [name] MRN: [id number] Date of Birth: 14-Nov-1946  Transition of Care [facility]) CM/SW Contact  Konrad Penta, RN Phone Number: [phone] 09/26/2020, 2:05 PM  Clinical Narrative:  Made aware by LCSW that [name]' transition plan is for home with home health not SNF. Spoke with [name] again to confirm plan. States [name] is not stable for dc at this time. Reports he is going down for CT scan. In the meantime, discussed that writer will order home oxygen and 3 in 1 as previously discussed.     Expected Discharge Plan: [place] at Home Barriers to Discharge: Continued Medical Work up  Expected Discharge Plan and Services Expected Discharge Plan: [place] In-house Referral: NA Discharge Planning Services: CM Consult Post Acute Care Choice: NA Living arrangements for the past 2 months: Single Family Home- with spouse Expected Discharge Date: 09/26/20                  DME AGENCY: [employer]- spoke with [place] to order 3 in 1 and home oxygen. Message sent to MD for 02 liter flow.      HH Arranged: PT - will need orders for OT/RN/aide [place]: [facility] [employer] Date [place]: 09/02/20 and 09/26/20 Time [place]: 1000 Representative spoke with at [place]: Jonelle Sidle and Olivia Mackie on 09/26/20   Marthenia Rolling, MSN, RN,BSN Inpatient [facility] Case Manager [phone]

## 2020-09-27 DIAGNOSIS — J1282 Pneumonia due to Coronavirus disease 2019: Secondary | ICD-10-CM | POA: Diagnosis not present

## 2020-09-27 DIAGNOSIS — U071 COVID-19: Secondary | ICD-10-CM | POA: Diagnosis not present

## 2020-09-27 LAB — BASIC METABOLIC PANEL
Anion gap: 13 (ref 5–15)
BUN: 23 mg/dL (ref 8–23)
CO2: 32 mmol/L (ref 22–32)
Calcium: 9.4 mg/dL (ref 8.9–10.3)
Chloride: 95 mmol/L — ABNORMAL LOW (ref 98–111)
Creatinine, Ser: 1.68 mg/dL — ABNORMAL HIGH (ref 0.61–1.24)
GFR, Estimated: 40 mL/min — ABNORMAL LOW (ref >60)
Glucose, Bld: 108 mg/dL — ABNORMAL HIGH (ref 70–99)
Potassium: 3.4 mmol/L — ABNORMAL LOW (ref 3.5–5.1)
Sodium: 140 mmol/L (ref 135–145)

## 2020-09-27 LAB — SEDIMENTATION RATE: Sed Rate: 47 mm/hr — ABNORMAL HIGH (ref 0–16)

## 2020-09-27 LAB — MAGNESIUM: Magnesium: 1.5 mg/dL — ABNORMAL LOW (ref 1.7–2.4)

## 2020-09-27 LAB — C-REACTIVE PROTEIN: CRP: 0.7 mg/dL (ref <1.0)

## 2020-09-27 MED ORDER — POTASSIUM CHLORIDE CRYS ER 20 MEQ PO TBCR
40.0000 meq | EXTENDED_RELEASE_TABLET | Freq: Once | ORAL | Status: AC
  Administered 2020-09-27: 40 meq via ORAL
  Filled 2020-09-27: qty 2

## 2020-09-27 MED ORDER — MAGNESIUM SULFATE 2 GM/50ML IV SOLN
2.0000 g | Freq: Once | INTRAVENOUS | Status: AC
  Administered 2020-09-27: 2 g via INTRAVENOUS
  Filled 2020-09-27: qty 50

## 2020-09-27 NOTE — Progress Notes (Addendum)
PROGRESS NOTE    [name]  GMW:[id number] DOB: 08-09-1946 DOA: 08/25/2020 PCP: [name], MD    Chief Complaint  Patient presents with  . Shortness of Breath    Brief Narrative:  Patient is a 74 year old Caucasian male with history of hypertension, hyperlipidemia, atrial fibrillation, coronary artery disease s/p LAD PCI, chronic kidney disease stage III and chronic systolic heart failure presented to hospital for worsening shortness of breath, cough with nasal congestion and severe generalized weakness and found to have acute hypoxic respiratory failure secondary to Covid pneumonia and acute on chronic systolic congestive heart failure. Patient also found to have bilateral lower extremity DVT.Patient is on Eliquis for DVT.Plan was to discharge patient home  with home services and walk test ordered to find out oxygen requirement. Patient became severely hypoxic when change his position from laying to sitting and oxygen saturation dropped to 85%. Oxygen saturation was 80% on room air and needed 10 L of oxygen with exertion to maintain oxygen saturation of 90%. Patient could not be discharge because of worsening dyspnea with mild exertion and requirement of high amount of oxygen.    Subjective:  He is on 2 L oxygen supplement at rest, no hypoxia at rest Does require higher amount of oxygen supplement with minimal exertion, he does not appear to be volume overloaded Cr continue to  trend up Wife at bedside  Assessment & Plan:   Principal Problem:   Pneumonia due to COVID-19 virus Active Problems:   Essential hypertension   CAD S/P LAD PCI- 2000   Atrial fibrillation (HCC)   Chronic systolic heart failure (HCC)   Sleep apnea   Nonischemic cardiomyopathy (HCC)   CKD (chronic kidney disease) stage 3, GFR 30-59 ml/min (HCC)   Acute respiratory failure with hypoxia (HCC)   Hyperlipidemia   Acute hypoxic respiratory failure secondary to COVID-19 pneumonia and acute  on chronic systolic CHF exacerbation -Continue require higher amount of oxygen with minimal exertion, repeat x-ray showed interval improvement -We will hold Lasix today on October 10 due to elevated creatinine creatinine, close monitor volume status and renal function, resume lasix if cr trend down  COVID-19 pneumonia Patient  completed remdesivir and steroid course for Covid pneumonia. Patient is no more on airborne precautions because it is more than 21 days since Covid positive Due to persistent oxygen requirement, repeat chest x-ray ordered showed interval improvement He will need to discharge home on home oxygen 2 L at rest 4 L-6 liters with activity to keep oxygen saturation greater than 88%, order placed  Acute on chronic systolic CHF exacerbation s/p ICD He received aggressive diuresis during hospital course ,currently appear euvolemic to dry, with borderline blood pressure, and creatinine trend up, will hold Lasix for now  New onset bilateral lower extremity DVT Continue Eliquis. Chest CT was also done and was negative for pulmonary embolism.  Acute metabolic encephalopathy, resolved Patientis no more acutelyconfusedand athis baseline.  AKI on CKD stage III Creatinine 1.06 on October 3, creatinine slowly trending up to 1.52 today -Today he report dysuria, UA concerning for UTI, urine culture obtained -Empirically start Rocephin, follow-up urine culture -Hold Lasix and losartan  Hypokalemia/hypomagnesemia: Replace K and magnesium, repeat in the morning  Hypertension Blood pressure low normal, decrease metoprolol, hold Lasix Hold losartan  Chronic atrial fibrillation Paced rhythm Continue Toprol and Eliquis  Coronary artery disease Stable. Continue home Toprol, digoxin ,statin and Eliquis  Oral thrush, improved Patient completed Diflucan course  FTT: CIR placement denied by  insurance, patient declined skilled nursing facility placement, will need maximize  home health, order placed   DVT prophylaxis: SCDs Start: 08/26/20 1045 apixaban (ELIQUIS) tablet 5 mg   Code Status: Full Family Communication: Wife at bedside Disposition:   Status is: Inpatient   Dispo: The patient is from: home              Anticipated d/c is to: home with home health, patient declined snf placement              Anticipated d/c date is: tomorrow if cr improves, follow-up on urine culture result              Patient currently not medically ready to discharge  Consultants:   None  Procedures:   None  Antimicrobials:   Rocephin     Objective: Vitals:   09/26/20 1820 09/26/20 2006 09/27/20 0416 09/27/20 0851  BP: (!) 98/50 (!) 119/51 116/62 (!) 117/51  Pulse: 80 69 72 78  Resp:  19 16   Temp:  97.8 F (36.6 C) (!) 97.4 F (36.3 C)   TempSrc:  Oral Oral   SpO2:  100% 95% 95%  Weight:   78.7 kg   Height:        Intake/Output Summary (Last 24 hours) at 09/27/2020 1131 Last data filed at 09/27/2020 0900 Gross per 24 hour  Intake 600 ml  Output 260 ml  Net 340 ml   Filed Weights   09/25/20 0425 09/26/20 0500 09/27/20 0416  Weight: 79.8 kg 79.9 kg 78.7 kg    Examination:  General exam: Weak and frail, NAD Respiratory system: Diminished at bases, no wheezing, no rales, no rhonchi . Respiratory effort normal. Cardiovascular system: S1 & S2 heard, paced rhythm. No JVD, no murmur, No pedal edema. Gastrointestinal system: Abdomen is nondistended, soft and nontender.  Normal bowel sounds heard. Central nervous system: Alert and oriented. No focal neurological deficits. Extremities: Equal, generalized weakness Skin: No rashes, lesions or ulcers Psychiatry: Judgement and insight appear normal. Mood & affect appropriate.     Data Reviewed: I have personally reviewed following labs and imaging studies  CBC: Recent Labs  Lab 09/22/20 0536 09/23/20 0952 09/24/20 0619 09/25/20 0324 09/26/20 0658  WBC 8.3 8.7 9.1 8.4 7.8  HGB 11.8* 11.8*  12.1* 11.9* 11.5*  HCT 38.0* 38.9* 38.7* 38.9* 38.0*  MCV 82.8 81.7 82.2 81.7 82.3  PLT 340 237 375 372 703    Basic Metabolic Panel: Recent Labs  Lab 09/23/20 0731 09/24/20 0619 09/25/20 0324 09/26/20 0658 09/27/20 0300  NA 141 142 140 142 140  K 3.8 3.3* 3.0* 3.7 3.4*  CL 99 99 97* 99 95*  CO2 27 31 31  32 32  GLUCOSE 88 95 108* 96 108*  BUN 16 21 23 21 23   CREATININE 1.40* 1.47* 1.46* 1.52* 1.68*  CALCIUM 9.4 9.4 9.4 9.2 9.4  MG  --   --   --   --  1.5*    GFR: Estimated Creatinine Clearance: 41 mL/min (A) (by C-G formula based on SCr of 1.68 mg/dL (H)).  Liver Function Tests: No results for input(s): AST, ALT, ALKPHOS, BILITOT, PROT, ALBUMIN in the last 168 hours.  CBG: No results for input(s): GLUCAP in the last 168 hours.   No results found for this or any previous visit (from the past 240 hour(s)).       Radiology Studies: DG Chest 2 View  Result Date: 09/26/2020 CLINICAL DATA:  74 year old male with shortness of breath.  Positive COVID-19. EXAM: CHEST - 2 VIEW COMPARISON:  Chest radiograph dated 09/08/2020. FINDINGS: Similar or slightly improved airspace opacity involving the left lung compared to the prior radiograph. Right lung streaky densities may represent atelectasis or infiltrate. No large pleural effusion. No pneumothorax. Stable cardiomediastinal silhouette. Atherosclerotic calcification of the aorta. Right pectoral AICD device. No acute osseous pathology. IMPRESSION: Similar or slightly improved airspace opacity involving the left lung. Electronically Signed   By: Anner Crete M.D.   On: 09/26/2020 16:04        Scheduled Meds: . apixaban  5 mg Oral BID  . vitamin C  500 mg Oral Daily  . digoxin  0.125 mg Oral Daily  . fenofibrate  160 mg Oral Daily  . ferrous sulfate  325 mg Oral BID  . folic acid  1 mg Oral QPM  . hydrocortisone  25 mg Rectal BID  . metoprolol succinate  12.5 mg Oral Daily  . oxymetazoline  1 spray Each Nare BID  .  pantoprazole  40 mg Oral BID  . pravastatin  40 mg Oral Daily  . saccharomyces boulardii  250 mg Oral BID  . saline  1 application Each Nare M7W  . sertraline  150 mg Oral Q1400  . sodium chloride  1 spray Each Nare Q6H  . zinc sulfate  220 mg Oral Daily   Continuous Infusions: . sodium chloride 250 mL (09/13/20 0524)  . cefTRIAXone (ROCEPHIN)  IV 1 g (09/26/20 2014)     LOS: 32 days   Time spent: 63mins Greater than 50% of this time was spent in counseling, explanation of diagnosis, planning of further management, and coordination of care.  I have personally reviewed and interpreted on  09/27/2020 daily labs, tele strips, imagings as discussed above under date review session and assessment and plans.  I reviewed all nursing notes, pharmacy notes, consultant notes,  vitals, pertinent old records  I have discussed plan of care as described above with RN , patient and family on 09/27/2020  Voice Recognition /Dragon dictation system was used to create this note, attempts have been made to correct errors. Please contact the author with questions and/or clarifications.   Florencia Reasons, MD PhD FACP [facility]  Available via Epic secure chat 7am-7pm for nonurgent issues Please page for urgent issues To page the attending provider between 7A-7P or the covering provider during after hours 7P-7A, please log into the web site [email or web address] and access using universal [place] password for that web site. If you do not have the password, please call the hospital operator.    09/27/2020, 11:31 AM

## 2020-09-28 ENCOUNTER — Telehealth: Payer: Self-pay | Admitting: Cardiology

## 2020-09-28 ENCOUNTER — Other Ambulatory Visit (HOSPITAL_COMMUNITY): Payer: Self-pay | Admitting: Internal Medicine

## 2020-09-28 DIAGNOSIS — J1282 Pneumonia due to Coronavirus disease 2019: Secondary | ICD-10-CM | POA: Diagnosis not present

## 2020-09-28 DIAGNOSIS — U071 COVID-19: Secondary | ICD-10-CM | POA: Diagnosis not present

## 2020-09-28 LAB — BASIC METABOLIC PANEL
Anion gap: 12 (ref 5–15)
BUN: 21 mg/dL (ref 8–23)
CO2: 30 mmol/L (ref 22–32)
Calcium: 9.3 mg/dL (ref 8.9–10.3)
Chloride: 100 mmol/L (ref 98–111)
Creatinine, Ser: 1.56 mg/dL — ABNORMAL HIGH (ref 0.61–1.24)
GFR, Estimated: 44 mL/min — ABNORMAL LOW (ref >60)
Glucose, Bld: 89 mg/dL (ref 70–99)
Potassium: 3.6 mmol/L (ref 3.5–5.1)
Sodium: 142 mmol/L (ref 135–145)

## 2020-09-28 LAB — URINE CULTURE: Culture: >=100000 — AB

## 2020-09-28 LAB — MAGNESIUM: Magnesium: 2.1 mg/dL (ref 1.7–2.4)

## 2020-09-28 MED ORDER — ALBUTEROL SULFATE HFA 108 (90 BASE) MCG/ACT IN AERS: 2.0000 | INHALATION_SPRAY | RESPIRATORY_TRACT | 0 refills | Status: DC | PRN

## 2020-09-28 MED ORDER — SODIUM CHLORIDE 0.9 % IV SOLN: 1.0000 g | Freq: Once | INTRAVENOUS | Status: DC

## 2020-09-28 MED ORDER — SACCHAROMYCES BOULARDII 250 MG PO CAPS: 250.0000 mg | ORAL_CAPSULE | Freq: Two times a day (BID) | ORAL | 0 refills | Status: DC

## 2020-09-28 MED ORDER — FUROSEMIDE 20 MG PO TABS: 20.0000 mg | ORAL_TABLET | Freq: Two times a day (BID) | ORAL | 0 refills | Status: DC

## 2020-09-28 MED FILL — FLORASTOR 250 MG CAPSULE: 250 | 14 days supply | Qty: 28 | Fill #0

## 2020-09-28 MED FILL — ALBUTEROL SULFATE HFA 108 (: 108 (90 BAS | 25 days supply | Qty: 9 | Fill #0

## 2020-09-28 MED FILL — FUROSEMIDE 20 MG TAB: 20 | 30 days supply | Qty: 60 | Fill #0

## 2020-09-28 NOTE — TOC Transition Note (Addendum)
Transition of Care [facility]) - CM/SW Discharge Note   Patient Details  Name: [name] MRN: [id number] Date of Birth: August 07, 1946  Transition of [facility] CM/SW Contact:  [name] Mayo, RN Phone Number: 09/28/2020, 1:20 PM   Clinical Narrative:    Patient is for dc today, NCM received consult that patient will need home oxygen set up , NCM spoke with patient who states to talk to wife.  NCM spoke with wife who states it is ok to work with [employer] for the home oxygen.  NCM made referral to [facility] with [employer],  He states the concentrator will go up to 10 liters, per previous NCM note wife wants 3 n 1 also.  [place] notified.  NCM notified Helene Kelp with [facility] . Oxygen will need to be at the house before he discharges since he goes up to 10 liters on exertion.     Final next level of care: [place] Barriers to Discharge: No Barriers Identified   Patient Goals and CMS Choice Patient states their goals for this hospitalization and ongoing recovery are:: get better CMS Medicare.gov Compare Post Acute Care list provided to:: Patient Represenative (must comment) Choice offered to / list presented to : Spouse  Discharge Placement                       Discharge Plan and Services In-house Referral: NA Discharge Planning Services: CM Consult Post Acute Care Choice: NA          DME Arranged: Oxygen, 3-N-1 DME Agency: [employer] Date DME Agency Contacted: 09/28/20 Time DME Agency Contacted: [phone] Representative spoke with at DME Agency: [place]: PT, OT, Nurse's Aide [place] Agency: [facility] (formerly [employer]) Date [place]: 09/28/20 Time [place]: [phone] Representative spoke with at [place]: [place] ([place]) Interventions     Readmission Risk Interventions Readmission Risk Prevention Plan 08/31/2020  Transportation Screening Complete  Medication Review Press photographer) Complete  HRI  or [place] Not Applicable  Some recent data might be hidden

## 2020-09-28 NOTE — Plan of Care (Signed)
  Problem: Education: Goal: Knowledge of General Education information will improve Description: Including pain rating scale, medication(s)/side effects and non-pharmacologic comfort measures Outcome: Adequate for Discharge   Problem: Health Behavior/Discharge Planning: Goal: Ability to manage health-related needs will improve Outcome: Adequate for Discharge   Problem: Clinical Measurements: Goal: Ability to maintain clinical measurements within normal limits will improve Outcome: Adequate for Discharge Goal: Will remain free from infection Outcome: Adequate for Discharge Goal: Diagnostic test results will improve Outcome: Adequate for Discharge Goal: Respiratory complications will improve Outcome: Adequate for Discharge Goal: Cardiovascular complication will be avoided Outcome: Adequate for Discharge   Problem: Activity: Goal: Risk for activity intolerance will decrease Outcome: Adequate for Discharge   Problem: Nutrition: Goal: Adequate nutrition will be maintained Outcome: Adequate for Discharge   Problem: Coping: Goal: Level of anxiety will decrease Outcome: Adequate for Discharge   Problem: Elimination: Goal: Will not experience complications related to bowel motility Outcome: Adequate for Discharge Goal: Will not experience complications related to urinary retention Outcome: Adequate for Discharge   Problem: Pain Managment: Goal: General experience of comfort will improve Outcome: Adequate for Discharge   Problem: Safety: Goal: Ability to remain free from injury will improve Outcome: Adequate for Discharge   Problem: Skin Integrity: Goal: Risk for impaired skin integrity will decrease Outcome: Adequate for Discharge   Problem: Education: Goal: Knowledge of General Education information will improve Description: Including pain rating scale, medication(s)/side effects and non-pharmacologic comfort measures Outcome: Adequate for Discharge   Problem: Health  Behavior/Discharge Planning: Goal: Ability to manage health-related needs will improve Outcome: Adequate for Discharge   Problem: Clinical Measurements: Goal: Ability to maintain clinical measurements within normal limits will improve Outcome: Adequate for Discharge Goal: Will remain free from infection Outcome: Adequate for Discharge Goal: Diagnostic test results will improve Outcome: Adequate for Discharge Goal: Respiratory complications will improve Outcome: Adequate for Discharge Goal: Cardiovascular complication will be avoided Outcome: Adequate for Discharge   Problem: Activity: Goal: Risk for activity intolerance will decrease Outcome: Adequate for Discharge   Problem: Safety: Goal: Ability to remain free from injury will improve Outcome: Adequate for Discharge   Problem: Skin Integrity: Goal: Risk for impaired skin integrity will decrease Outcome: Adequate for Discharge

## 2020-09-28 NOTE — Discharge Summary (Signed)
Physician Discharge Summary  [name] PRF:[id number] DOB: 1946-07-31 DOA: 08/25/2020  PCP: [name], MD  Admit date: 08/25/2020 Discharge date: 09/28/2020  Admitted From: Home Disposition: Home with home health therapies  Recommendations for Outpatient Follow-up:  1. Follow up with PCP in 1weeks 2. Please obtain BMP/CBC in one week 3. Follow-up with cardiology as planned.  Home Health: PT/OT/home health aide Equipment/Devices: Oxygen, 3 and 1, walker  Discharge Condition: Stable CODE STATUS: Full code Diet recommendation: Low-salt diet  Discharge summary: 74 year old gentleman with history of hypertension, hyperlipidemia, paroxysmal A. fib on Coumadin, coronary artery disease status post stent in LAD, chronic kidney disease stage IIIa, chronic systolic heart failure presented to the ER with worsening shortness of breath, cough with nasal congestion and severe generalized weakness and found to have acute hypoxemic respiratory failure secondary to Covid pneumonia as well as acute on chronic systolic heart failure.  Patient was also found to have bilateral lower extremity DVT.  Patient remained in the hospital due to multiple medical issues and spent 33 days today and going home.  Patient is treated for multiple medical issues, addressed below.  Acute hypoxemic respiratory failure secondary to combination of COVID-19 pneumonia as well as acute on chronic systolic CHF exacerbation.  His ejection fraction was 25 to 30%.  Patient has a pacemaker in place. Patient was treated with COVID-19 directed therapy and completed treatment with steroids and remdesivir.  He is 33 days in the hospital and taken off the isolation precautions.  Patient continue to remain short of breath, treated for systolic heart failure with aggressive diuresis with improvement.  Plan: Patient remains on anywhere between 2 to 6 L of oxygen, more on mobility however has stabilized. He has completed COVID-19  directed therapies Patient will continue to work with physical therapy, occupational therapy, he will continue to do chest physiotherapy at home. Currently euvolemic, renal functions fluctuate, will start patient on half dose of Lasix 20 mg twice a day, will continue to hold losartan until he has repeat BMP done in 1 week. Electrolytes are replaced and normalized. Patient has chronic A. fib and was on Coumadin, he was found to have bilateral lower extremity DVT.  Coumadin was stopped and he was started on Eliquis. Patient is already on Toprol, digoxin, statin.  Patient also developed dysuria, urine culture positive for Klebsiella more than 100,000 colonies.  Has a history of C. difficile colitis.  Will reduce duration of antibiotics to the minimum.  He will receive 3 days of Rocephin today and has tolerated.  Urinary symptoms improved.  Patient is going home today with home health therapies, oxygen and DME supplies.   Discharge Diagnoses:  Principal Problem:   Pneumonia due to COVID-19 virus Active Problems:   Essential hypertension   CAD S/P LAD PCI- 2000   Atrial fibrillation (HCC)   Chronic systolic heart failure (HCC)   Sleep apnea   Nonischemic cardiomyopathy (HCC)   CKD (chronic kidney disease) stage 3, GFR 30-59 ml/min (HCC)   Acute respiratory failure with hypoxia ([place])   Hyperlipidemia    Discharge Instructions  Discharge Instructions    Call MD for:  difficulty breathing, headache or visual disturbances   Complete by: As directed    Diet - low sodium heart healthy   Complete by: As directed    Discharge instructions   Complete by: As directed    Do not use Coumadin, you are on new blood thinner called Eliquis.will need repeat blood test including kidney test  in 1 week. Continue to use your breathing therapies at home.   Increase activity slowly   Complete by: As directed    Increase activity slowly   Complete by: As directed      Allergies as of 09/28/2020       Reactions   Keflex [cephalexin] Other (See Comments)   dizziness      Medication List    STOP taking these medications   losartan 25 MG tablet Commonly known as: COZAAR   warfarin 1 MG tablet Commonly known as: COUMADIN     TAKE these medications   acetaminophen 500 MG tablet Commonly known as: TYLENOL Take 500 mg by mouth every 8 (eight) hours as needed for mild pain or moderate pain.   albuterol 108 (90 Base) MCG/ACT inhaler Commonly known as: VENTOLIN HFA Inhale 2 puffs into the lungs every 4 (four) hours as needed for wheezing or shortness of breath.   apixaban 5 MG Tabs tablet Commonly known as: ELIQUIS Take 1 tablet (5 mg total) by mouth 2 (two) times daily.   digoxin 0.125 MG tablet Commonly known as: LANOXIN Take 1 tablet (0.125 mg total) by mouth daily. What changed:   how much to take  when to take this   fenofibrate 160 MG tablet TAKE 1 TABLET BY MOUTH  DAILY What changed: when to take this   ferrous sulfate 325 (65 FE) MG tablet Take 325 mg by mouth 2 (two) times daily.   folic acid 914 MCG tablet Commonly known as: FOLVITE Take 800 mcg by mouth every evening.   furosemide 20 MG tablet Commonly known as: LASIX Take 1 tablet (20 mg total) by mouth 2 (two) times daily. with food What changed:   medication strength  how much to take  when to take this   Magnesium 250 MG Tabs Take 500 mg by mouth daily.   metoprolol succinate 25 MG 24 hr tablet Commonly known as: TOPROL-XL TAKE 1 TABLET BY MOUTH  DAILY AT 12 NOON. What changed: See the new instructions.   MULTIVITAMIN ADULTS PO Take 1 tablet by mouth daily. Senior   nitroGLYCERIN 0.4 MG SL tablet Commonly known as: NITROSTAT Place 1 tablet (0.4 mg total) under the tongue every 5 (five) minutes as needed for chest pain.   pantoprazole 40 MG tablet Commonly known as: PROTONIX TAKE 2 TABLETS BY MOUTH  DAILY What changed:   how much to take  when to take this   potassium chloride  SA 20 MEQ tablet Commonly known as: KLOR-CON TAKE 1 TABLET BY MOUTH  DAILY. TAKE ADDITIONAL  TABLET WHEN TAKING EXTRA  LASIX (FUROSEMIDE) What changed: See the new instructions.   pravastatin 40 MG tablet Commonly known as: PRAVACHOL TAKE 1 TABLET BY MOUTH  DAILY   saccharomyces boulardii 250 MG capsule Commonly known as: FLORASTOR Take 1 capsule (250 mg total) by mouth 2 (two) times daily for 14 days.   sertraline 100 MG tablet Commonly known as: ZOLOFT Take 150 mg by mouth daily at 2 PM.   vitamin C 250 MG tablet Commonly known as: ASCORBIC ACID Take 250 mg by mouth 2 (two) times daily.            Durable Medical Equipment  (From admission, onward)         Start     Ordered   09/26/20 1605  For home use only DME oxygen  Once       Comments: 2liter at rest, 6liter with activity  Question  Answer Comment  Length of Need 6 Months   Mode or (Route) Nasal cannula   Liters per Minute 6   Frequency Continuous (stationary and portable oxygen unit needed)   Oxygen conserving device Yes   Oxygen delivery system Gas      09/26/20 1605   09/26/20 0932  For home use only DME wheelchair cushion (seat and back)  Once        09/26/20 0931   09/26/20 0931  DME 3-in-1  Once        09/26/20 0930   09/26/20 0930  DME Walker  Once       Question Answer Comment  Walker: With 5 Inch Wheels   Patient needs a walker to treat with the following condition SOB (shortness of breath)      09/26/20 0930          Follow-up Information    Home, [facility] Follow up.   Specialty: Home Health Services Why: HHPT, [place], [facility] Contact information: [place] [address] [phone]        [name], MD Follow up in 1 week(s).   Specialty: Family Medicine Why: hospital discharge follow up, repeat basic labs ( cbc/bmp ) at hospital discharge follow up pcp to repeat chest x ray in 2-3 weeks to ensure resolution of lung infiltrate.  Contact  information: [address] [place] [address] [phone]        [place], Peter M, MD .   Specialty: Cardiology Contact information: [address] [place] [address] [phone]        [name], MD .   Specialty: Cardiology Contact information: [phone] [place] [address] [facility] Follow up.   Why: home oxygen, 3 n 1 Contact information: [address] [place] [address] [phone]              Allergies  Allergen Reactions  . Keflex [Cephalexin] Other (See Comments)    dizziness    Consultations:  None    Procedures/Studies: DG Chest 2 View  Result Date: 09/26/2020 CLINICAL DATA:  74 year old male with shortness of breath. Positive COVID-19. EXAM: CHEST - 2 VIEW COMPARISON:  Chest radiograph dated 09/08/2020. FINDINGS: Similar or slightly improved airspace opacity involving the left lung compared to the prior radiograph. Right lung streaky densities may represent atelectasis or infiltrate. No large pleural effusion. No pneumothorax. Stable cardiomediastinal silhouette. Atherosclerotic calcification of the aorta. Right pectoral AICD device. No acute osseous pathology. IMPRESSION: Similar or slightly improved airspace opacity involving the left lung. Electronically Signed   By: Anner Crete M.D.   On: 09/26/2020 16:04   CT ANGIO CHEST PE W OR WO CONTRAST  Result Date: 09/01/2020 CLINICAL DATA:  COVID positive, positive D-dimer, PE suspected. EXAM: CT ANGIOGRAPHY CHEST WITH CONTRAST TECHNIQUE: Multidetector CT imaging of the chest was performed using the standard protocol during bolus administration of intravenous contrast. Multiplanar CT image reconstructions and MIPs were obtained to evaluate the vascular anatomy. CONTRAST:  158mL OMNIPAQUE IOHEXOL 350 MG/ML SOLN COMPARISON:  Chest x-ray 08/31/2020, CT angio chest 02/22/2016 FINDINGS: Lines: Right chest wall  three lead cardiac defibrillator and pacemaker. Cardiovascular: Satisfactory opacification of the pulmonary arteries to the segmental level. No evidence of pulmonary embolism. Prominent right atrium and left ventricle. No significant pericardial effusion. At least moderate 4 vessel coronary artery calcifications. 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 Mediastinum/Nodes: Palpable of prominent, likely reactive mediastinal lymph nodes with as an example a 1.2 cm right paratracheal lymph node (6:101) and a 1 cm precarinal lymph node on the left (6:109). There is suggestion of a 1 cm right infrahilar lymph node (6:159). As well as suggestion of a prominent left infrahilar lymph node (05/29/2040). No axillary lymph nodes. Thyroid gland, trachea, and esophagus demonstrate no significant findings. Lungs/Pleura: Diffuse, bilateral lower lobe and left upper lobe predominant, peripheral and peribronchovascular patchy and consolidative airspace opacities. No pleural effusion or pneumothorax bilaterally. Upper Abdomen: Partially visualized 4.1 cm fluid density lesion within the left kidney likely represents a renal cyst. Musculoskeletal: No chest wall abnormality No suspicious lytic or blastic osseous lesions. No acute displaced fracture. Multilevel degenerative changes of the spine. Review of the MIP images confirms the above findings. IMPRESSION: 1. No central or segmental pulmonary embolus. Limited evaluation of the subsegmental level due to timing of contrast. 2. Diffuse pulmonary findings consistent with COVID infection with likely reactive infrahilar and mediastinal lymphadenopathy. 3. 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 4.  Aortic Atherosclerosis (ICD10-I70.0). Electronically Signed   By: Iven Finn M.D.   On: 09/01/2020 21:51    DG CHEST PORT 1 VIEW  Result Date: 09/08/2020 CLINICAL DATA:  Shortness of breath, COVID-19 positive EXAM: PORTABLE CHEST 1 VIEW COMPARISON:  08/31/2020, 09/01/2020 FINDINGS: Right-sided implanted cardiac device, unchanged. Cardiomegaly. Aortic atherosclerosis. Diffuse airspace consolidation throughout the left lung has become slightly more consolidative compared to the prior x-ray. Progressive predominantly interstitial opacities throughout the right lung. No large pleural fluid collection. No pneumothorax identified. IMPRESSION: Slight interval worsening of diffuse bilateral airspace opacities, left greater than right. Electronically Signed   By: Davina Poke D.O.   On: 09/08/2020 10:07   DG CHEST PORT 1 VIEW  Result Date: 08/31/2020 CLINICAL DATA:  Dyspnea. EXAM: PORTABLE CHEST 1 VIEW COMPARISON:  08/25/2020 FINDINGS: Right chest wall ICD is noted with leads in the right atrial appendage, coronary sinus and right ventricle. Stable cardiomediastinal contours. Aortic atherosclerotic calcifications noted. Diffuse airspace and interstitial opacities in the left lung have increased in the interval. New, diffuse interstitial opacities within the right lung are noted on today's exam. IMPRESSION: 1. Progressive interstitial and airspace opacities throughout the left lung with new diffuse interstitial opacities in the right lung. Electronically Signed   By: Kerby Moors M.D.   On: 08/31/2020 09:36   VAS [place] LOWER EXTREMITY VENOUS (DVT)  Result Date: 09/01/2020  Lower Venous DVTStudy Indications: Elevated D dimer.  Risk Factors: + Covid. Performing Technologist: Griffin Basil RCT RDMS  Examination Guidelines: A complete evaluation includes B-mode imaging, spectral Doppler, color Doppler, and power Doppler as needed of all accessible portions of each vessel. Bilateral testing is considered an integral part of a complete examination. Limited examinations for reoccurring indications may be performed as  noted. The reflux portion of the exam is performed with the patient in reverse Trendelenburg.  +---------+---------------+---------+-----------+----------+--------------+ RIGHT    CompressibilityPhasicitySpontaneityPropertiesThrombus Aging +---------+---------------+---------+-----------+----------+--------------+ CFV      Full           Yes      Yes                                 +---------+---------------+---------+-----------+----------+--------------+ SFJ      Full                                                        +---------+---------------+---------+-----------+----------+--------------+  FV Prox  Full                                                        +---------+---------------+---------+-----------+----------+--------------+ FV Mid   Full                                                        +---------+---------------+---------+-----------+----------+--------------+ FV DistalFull                                                        +---------+---------------+---------+-----------+----------+--------------+ PFV      Full                                                        +---------+---------------+---------+-----------+----------+--------------+ POP      Full           Yes      Yes                                 +---------+---------------+---------+-----------+----------+--------------+ PTV      Full                                                        +---------+---------------+---------+-----------+----------+--------------+ PERO     None                                         Acute          +---------+---------------+---------+-----------+----------+--------------+   +---------+---------------+---------+-----------+----------+--------------+ LEFT     CompressibilityPhasicitySpontaneityPropertiesThrombus Aging +---------+---------------+---------+-----------+----------+--------------+ CFV      Full            Yes      Yes                                 +---------+---------------+---------+-----------+----------+--------------+ SFJ      Full                                                        +---------+---------------+---------+-----------+----------+--------------+ FV Prox  Full                                                        +---------+---------------+---------+-----------+----------+--------------+  FV Mid   Full                                                        +---------+---------------+---------+-----------+----------+--------------+ FV DistalFull                                                        +---------+---------------+---------+-----------+----------+--------------+ PFV      Full                                                        +---------+---------------+---------+-----------+----------+--------------+ POP      Full           Yes      Yes                                 +---------+---------------+---------+-----------+----------+--------------+ PTV      Full                                                        +---------+---------------+---------+-----------+----------+--------------+ PERO     None                                         Acute          +---------+---------------+---------+-----------+----------+--------------+     Summary: RIGHT: - Findings consistent with acute deep vein thrombosis involving the right peroneal veins. - No cystic structure found in the popliteal fossa.  LEFT: - Findings consistent with acute deep vein thrombosis involving the left peroneal veins. - No cystic structure found in the popliteal fossa.  *See table(s) above for measurements and observations. Electronically signed by Curt Jews MD on 09/01/2020 at 5:00:44 PM.    Final    ECHOCARDIOGRAM LIMITED  Result Date: 09/17/2020    ECHOCARDIOGRAM LIMITED REPORT   Patient Name:   ADHVIK CANADY [facility] Date of Exam: 09/17/2020 Medical Rec #:   [id number]       Height:       70.0 in Accession #:    [id number]      Weight:       180.8 lb Date of Birth:  29-Jan-1946       BSA:          2.000 m Patient Age:    33 years        BP:           104/59 mmHg Patient Gender: M               HR:           72 bpm. Exam Location:  Inpatient Procedure: Limited Echo, Intracardiac Opacification Agent, Cardiac Doppler and  Limited Color Doppler Indications:    Acute Respiratory Insufficiency 518.82 / R06.89  History:        Patient has prior history of Echocardiogram examinations, most                 recent 10/01/2017. CHF, CAD and Previous Myocardial Infarction,                 Defibrillator, PAD, Arrythmias:Atrial Fibrillation; Risk                 Factors:Dyslipidemia. Rencent COVID pneumon, Ischemic                 cardiomyopathy, Chronic renal insufficiency.  Sonographer:    Darlina Sicilian RDCS Referring Phys: [id number] [facility]  Sonographer Comments: Technically challenging study due to limited acoustic windows. Image acquisition challenging due to respiratory motion. IMPRESSIONS  1. There is akinesis of all mid-to-distal septal, mid-to-distal inferior, mid-to-distal anterior and apical segments. All other segments are severely hypokinetic.Marland Kitchen Left ventricular ejection fraction, by estimation, is 25 to 30%. The left ventricle has severely decreased function. The left ventricular internal cavity size was mildly dilated.  2. Right ventricular systolic function is normal. The right ventricular size is normal. There is mildly elevated pulmonary artery systolic pressure.  3. The aortic valve is tricuspid. There is mild calcification of the aortic valve. There is mild thickening of the aortic valve. Aortic valve regurgitation is not visualized. Comparison(s): No significant change from prior study. Compared to prior TTE in 2018, there is no significant change. FINDINGS  Left Ventricle: There is akinesis of all mid-to-distal septal, mid-to-distal inferior,  mid-to-distal anterior and apical segments.All other segments are severely hypokinetic. Left ventricular ejection fraction, by estimation, is 25 to 30%. The left ventricle has severely decreased function. The left ventricular internal cavity size was moderately dilated. There is no left ventricular hypertrophy. Right Ventricle: The right ventricular size is normal. Right ventricular systolic function is normal. There is mildly elevated pulmonary artery systolic pressure. The tricuspid regurgitant velocity is 2.76 m/s, and with an assumed right atrial pressure of 8 mmHg, the estimated right ventricular systolic pressure is 00.1 mmHg. Mitral Valve: There is mild thickening of the mitral valve leaflet(s). Mild mitral annular calcification. Tricuspid Valve: The tricuspid valve is normal in structure. Tricuspid valve regurgitation is mild. Aortic Valve: The aortic valve is tricuspid. There is mild calcification of the aortic valve. There is mild thickening of the aortic valve. Aortic valve regurgitation is not visualized. Pulmonic Valve: The pulmonic valve was grossly normal. Pulmonic valve regurgitation is trivial. Aorta: The aortic root and ascending aorta are structurally normal, with no evidence of dilitation. Additional Comments: A pacer wire is visualized. LEFT VENTRICLE PLAX 2D LVIDd:         6.30 cm      Diastology LVIDs:         4.80 cm      LV e' medial:    6.43 cm/s LV PW:         0.90 cm      LV E/e' medial:  9.5 LV IVS:        0.90 cm      LV e' lateral:   5.40 cm/s                             LV E/e' lateral: 11.3  LV Volumes (MOD) LV vol d, MOD A2C: 184.0 ml LV vol d, MOD A4C:  193.0 ml LV vol s, MOD A2C: 136.0 ml LV vol s, MOD A4C: 143.0 ml LV SV MOD A2C:     48.0 ml LV SV MOD A4C:     193.0 ml LV SV MOD BP:      50.0 ml LEFT ATRIUM         Index LA diam:    4.10 cm 2.05 cm/m  AORTIC VALVE LVOT Vmax:   75.10 cm/s LVOT Vmean:  46.400 cm/s LVOT VTI:    0.096 m MITRAL VALVE               TRICUSPID VALVE MV  Area (PHT): 4.14 cm    TR Peak grad:   30.5 mmHg MV Decel Time: 183 msec    TR Vmax:        276.00 cm/s MV E velocity: 61.13 cm/s                            SHUNTS                            Systemic VTI: 0.10 m Gwyndolyn Kaufman MD Electronically signed by Gwyndolyn Kaufman MD Signature Date/Time: 09/17/2020/4:36:40 PM    Final     (Echo, Carotid, EGD, Colonoscopy, ERCP)    Subjective: Patient was seen and examined.  He is very excited to be ready to go home today.  Is still has difficulty breathing on mobility, however much improved. Called and discussed with patient's wife and updated her about discharge planning and she is already making follow-up appointments.   Discharge Exam: Vitals:   09/28/20 0505 09/28/20 1133  BP: 120/63 109/68  Pulse: 70 70  Resp: 16 19  Temp: 98 F (36.7 C) 98.5 F (36.9 C)  SpO2: 98% 96%   Vitals:   09/27/20 0851 09/27/20 2213 09/28/20 0505 09/28/20 1133  BP: (!) 117/51 121/71 120/63 109/68  Pulse: 78 70 70 70  Resp:  17 16 19   Temp:  98.7 F (37.1 C) 98 F (36.7 C) 98.5 F (36.9 C)  TempSrc:  Oral Oral Oral  SpO2: 95% 100% 98% 96%  Weight:   79.8 kg   Height:        General: Pt is alert, awake, not in acute distress On 2 L oxygen at rest. Cardiovascular: RRR, S1/S2 +, no rubs, no gallops, AICD in place. Respiratory: CTA bilaterally, no wheezing, no rhonchi, no added sounds. Abdominal: Soft, NT, ND, bowel sounds + Extremities: no edema, no cyanosis    The results of significant diagnostics from this hospitalization (including imaging, microbiology, ancillary and laboratory) are listed below for reference.     Microbiology: Recent Results (from the past 240 hour(s))  Culture, Urine     Status: Abnormal   Collection Time: 09/26/20  3:48 PM   Specimen: Urine, Random  Result Value Ref Range Status   Specimen Description URINE, RANDOM  Final   Special Requests   Final    NONE Performed at [facility], [address],  [place], [place] [address]    Culture >=100,000 COLONIES/mL KLEBSIELLA PNEUMONIAE (A)  Final   Report Status 09/28/2020 FINAL  Final   Organism ID, Bacteria KLEBSIELLA PNEUMONIAE (A)  Final      Susceptibility   Klebsiella pneumoniae - MIC*    AMPICILLIN >=32 RESISTANT Resistant     CEFAZOLIN <=4 SENSITIVE Sensitive     CEFTRIAXONE <=0.25 SENSITIVE  Sensitive     CIPROFLOXACIN <=0.25 SENSITIVE Sensitive     GENTAMICIN <=1 SENSITIVE Sensitive     IMIPENEM <=0.25 SENSITIVE Sensitive     NITROFURANTOIN 64 INTERMEDIATE Intermediate     TRIMETH/SULFA <=20 SENSITIVE Sensitive     AMPICILLIN/SULBACTAM 4 SENSITIVE Sensitive     PIP/TAZO 8 SENSITIVE Sensitive     * >=100,000 COLONIES/mL KLEBSIELLA PNEUMONIAE     Labs: BNP (last 3 results) Recent Labs    08/25/20 1813  BNP 193.7*   Basic Metabolic Panel: Recent Labs  Lab 09/24/20 0619 09/25/20 0324 09/26/20 0658 09/27/20 0300 09/28/20 0748  NA 142 140 142 140 142  K 3.3* 3.0* 3.7 3.4* 3.6  CL 99 97* 99 95* 100  CO2 31 31 32 32 30  GLUCOSE 95 108* 96 108* 89  BUN 21 23 21 23 21   CREATININE 1.47* 1.46* 1.52* 1.68* 1.56*  CALCIUM 9.4 9.4 9.2 9.4 9.3  MG  --   --   --  1.5* 2.1   Liver Function Tests: No results for input(s): AST, ALT, ALKPHOS, BILITOT, PROT, ALBUMIN in the last 168 hours. No results for input(s): LIPASE, AMYLASE in the last 168 hours. No results for input(s): AMMONIA in the last 168 hours. CBC: Recent Labs  Lab 09/22/20 0536 09/23/20 0952 09/24/20 0619 09/25/20 0324 09/26/20 0658  WBC 8.3 8.7 9.1 8.4 7.8  HGB 11.8* 11.8* 12.1* 11.9* 11.5*  HCT 38.0* 38.9* 38.7* 38.9* 38.0*  MCV 82.8 81.7 82.2 81.7 82.3  PLT 340 237 375 372 385   Cardiac Enzymes: No results for input(s): CKTOTAL, CKMB, CKMBINDEX, TROPONINI in the last 168 hours. BNP: Invalid input(s): POCBNP CBG: No results for input(s): GLUCAP in the last 168 hours. D-Dimer No results for input(s): DDIMER in the last 72 hours. Hgb A1c No results  for input(s): HGBA1C in the last 72 hours. Lipid Profile No results for input(s): CHOL, HDL, LDLCALC, TRIG, CHOLHDL, LDLDIRECT in the last 72 hours. Thyroid function studies No results for input(s): TSH, T4TOTAL, T3FREE, THYROIDAB in the last 72 hours.  Invalid input(s): FREET3 Anemia work up No results for input(s): VITAMINB12, FOLATE, FERRITIN, TIBC, IRON, RETICCTPCT in the last 72 hours. Urinalysis    Component Value Date/Time   COLORURINE YELLOW 09/26/2020 1518   APPEARANCEUR CLOUDY (A) 09/26/2020 1518   LABSPEC 1.020 09/26/2020 1518   PHURINE 5.0 09/26/2020 1518   GLUCOSEU NEGATIVE 09/26/2020 1518   HGBUR SMALL (A) 09/26/2020 1518   BILIRUBINUR NEGATIVE 09/26/2020 1518   KETONESUR NEGATIVE 09/26/2020 1518   PROTEINUR NEGATIVE 09/26/2020 1518   NITRITE NEGATIVE 09/26/2020 1518   LEUKOCYTESUR LARGE (A) 09/26/2020 1518   Sepsis Labs Invalid input(s): PROCALCITONIN,  WBC,  LACTICIDVEN Microbiology Recent Results (from the past 240 hour(s))  Culture, Urine     Status: Abnormal   Collection Time: 09/26/20  3:48 PM   Specimen: Urine, Random  Result Value Ref Range Status   Specimen Description URINE, RANDOM  Final   Special Requests   Final    NONE Performed at [facility], [place] [address], [place], [place] [address]    Culture >=100,000 COLONIES/mL KLEBSIELLA PNEUMONIAE (A)  Final   Report Status 09/28/2020 FINAL  Final   Organism ID, Bacteria KLEBSIELLA PNEUMONIAE (A)  Final      Susceptibility   Klebsiella pneumoniae - MIC*    AMPICILLIN >=32 RESISTANT Resistant     CEFAZOLIN <=4 SENSITIVE Sensitive     CEFTRIAXONE <=0.25 SENSITIVE Sensitive     CIPROFLOXACIN <=0.25  SENSITIVE Sensitive     GENTAMICIN <=1 SENSITIVE Sensitive     IMIPENEM <=0.25 SENSITIVE Sensitive     NITROFURANTOIN 64 INTERMEDIATE Intermediate     TRIMETH/SULFA <=20 SENSITIVE Sensitive     AMPICILLIN/SULBACTAM 4 SENSITIVE Sensitive     PIP/TAZO 8 SENSITIVE Sensitive     * >=100,000  COLONIES/mL KLEBSIELLA PNEUMONIAE     Time coordinating discharge:  45 minutes  SIGNED:   Barb Merino, MD  [facility] 09/28/2020, 1:43 PM

## 2020-09-28 NOTE — Progress Notes (Signed)
D/C instructions given and reviewed. Questions asked and answered but encouraged to call with any additional concerns. Tele and IV removed, tolerated well.

## 2020-09-28 NOTE — Progress Notes (Signed)
Physical Therapy Treatment Patient Details Name: [name] MRN: [id number] DOB: 13-Jun-1946 Today's Date: 09/28/2020    History of Present Illness [name] is a 74 y.o. male with medical history significant of nonischemic cardiomyopathy with ejection fraction of 25-30%, status post ICD placement, permanent A. fib/atrial flutter-on Coumadin, hypertension, hyperlipidemia, depression/anxiety, GERD, presents to emergency department with worsening shortness of breath and cough since 1 week. Tested pos for covid.     PT Comments    Pt is awaiting d/c this afternoon however, wc had not been ordered at time of visit. Spent majority of time talking through mobility in his home, energy conservation, O2 usage. Pt min guard for bed mobility, and min A for transfers and ambulation of 10 feet with RW. Pt does not want w/c at this time will consider if he needs it in the future. D/c plans appropriate. PT will continue to follow acutely.    Follow Up Recommendations  Home health PT;Supervision/Assistance - 24 hour (HHOT, HHAide)     Equipment Recommendations   (rollator at home)       Precautions / Restrictions Precautions Precautions: Fall Precaution Comments: watch 02 Restrictions Weight Bearing Restrictions: No    Mobility  Bed Mobility Overal bed mobility: Needs Assistance Bed Mobility: Supine to Sit Rolling: Supervision   Supine to sit: Min guard;HOB elevated Sit to supine: Supervision   General bed mobility comments: pt requires increased time and effort to get in and out of flattened bed  Transfers Overall transfer level: Needs assistance Equipment used: Rolling walker (2 wheeled) Transfers: Sit to/from [facility] Sit to Stand: Min assist         General transfer comment: min A for power up into standing,pt continues to pull up on RW  Ambulation/Gait Ambulation/Gait assistance: Min assist Gait Distance (Feet): 10 Feet Assistive device: Rolling  walker (2 wheeled) Gait Pattern/deviations: Step-to pattern;Decreased stride length;Trunk flexed;Wide base of support Gait velocity: decreased   General Gait Details: minA for steadying with bilateral knee buckling         Balance Overall balance assessment: Needs assistance Sitting-balance support: Feet supported;No upper extremity supported Sitting balance-[name] Scale: Good Sitting balance - Comments: Sat EOB x 15 minutes total between sitting EoB and ambulation    Standing balance support: Bilateral upper extremity supported Standing balance-[name] Scale: Poor Standing balance comment: reliant on B UE support                            Cognition Arousal/Alertness: Awake/alert Behavior During Therapy: WFL for tasks assessed/performed Overall Cognitive Status: Within Functional Limits for tasks assessed Area of Impairment: Memory;Problem solving                             Problem Solving: Slow processing General Comments: easily self distracted          General Comments General comments (skin integrity, edema, etc.): pt on 3L during session able to maintain SaO2 >90%O2 throughout session       Pertinent Vitals/Pain Pain Assessment: No/denies pain           PT Goals (current goals can now be found in the care plan section) Acute Rehab PT Goals Patient Stated Goal: Start trying to do more for myself. PT Goal Formulation: With patient Time For Goal Achievement: 10/08/20 Potential to Achieve Goals: Good Progress towards PT goals: Progressing toward goals    Frequency  Min 3X/week      PT Plan Current plan remains appropriate       AM-PAC PT "6 Clicks" Mobility   Outcome Measure  Help needed turning from your back to your side while in a flat bed without using bedrails?: None Help needed moving from lying on your back to sitting on the side of a flat bed without using bedrails?: A Little Help needed moving to and from a bed to a  chair (including a wheelchair)?: A Little Help needed standing up from a chair using your arms (e.g., wheelchair or bedside chair)?: A Little Help needed to walk in hospital room?: A Little Help needed climbing 3-5 steps with a railing? : A Lot 6 Click Score: 18    End of Session Equipment Utilized During Treatment: Oxygen;Gait belt Activity Tolerance: Patient limited by fatigue Patient left: with call bell/phone within reach;in bed;with family/visitor present Nurse Communication: Mobility status PT Visit Diagnosis: Unsteadiness on feet (R26.81);Muscle weakness (generalized) (M62.81);Dizziness and giddiness (R42)     Time: 7494-4967 PT Time Calculation (min) (ACUTE ONLY): 30 min  Charges:  $Therapeutic Activity: 8-22 mins $Self Care/Home Management: 8-22                     Kamyiah Colantonio B. Migdalia Dk PT, DPT Acute Rehabilitation Services Pager [phone] Office [phone]    [place] 09/28/2020, 4:36 PM

## 2020-09-28 NOTE — Telephone Encounter (Signed)
Santiago Glad is calling requesting to speak with [name] in regards to [name] being released from the hospital at some point today to discuss him needing his coumadin checked within a certain time frame. Please advise.

## 2020-09-28 NOTE — Telephone Encounter (Signed)
Spoke with pt's wife (okay per DPR). Pt is slated for discharge from [facility] hospital at some point today. Wife is seeking clarification on where husband can get his blood work that he has been told he will need about a week from now. Wife also wondering if pt can be scheduled to see Dr. Martinique sooner than the appointment he already has for 11/15. Advised pt's wife that the nurse who goes through her husband's discharge teaching should give her detailed instructions on where/when to go for follow up lab work as well as when his next follow up appointments are scheduled. Wife advised that hospital team will reach out to our office to schedule a sooner follow up visit if needed. Wife was also advised that there is a waiting period for patients recently hospitalized with COVID-19 to be able to come back into our office at [facility]. Wife advised to contact our office with any questions that are not answered by the nurse conducting her husband's discharge.

## 2020-09-28 NOTE — Progress Notes (Signed)
Occupational Therapy Treatment Patient Details Name: [name] MRN: [id number] DOB: May 09, 1946 Today's Date: 09/28/2020    History of present illness [name] is a 74 y.o. male with medical history significant of nonischemic cardiomyopathy with ejection fraction of 25-30%, status post ICD placement, permanent A. fib/atrial flutter-on Coumadin, hypertension, hyperlipidemia, depression/anxiety, GERD, presents to emergency department with worsening shortness of breath and cough since 1 week. Tested pos for covid.    OT comments  Pt making steady progress towards OT goals this session. Pt continues to present with decreased activity tolerance, dyspnea with exertion, and generalized weakness impacting pts ability to complete BADLs. Pt able to transition OOB to recliner with MIN A and RW to pivot from EOB>recliner. Pt currently requires total A for LB ADLs. Once in the chair pt motivated to work on taking functional steps as precursor to higher level functional mobility. Pt stood from recliner x3 with MOD A as pt prefers to pull on RW ( discussed safety concerns with pt pulling up on RW). Pt desat to 88% on 3L with pt symptomatic requesting to sit down without taking steps every trial. Pt with good carryover of education related to using pulse ox and IS. Pt would continue to benefit from skilled occupational therapy while admitted and after d/c to address the below listed limitations in order to improve overall functional mobility and facilitate independence with BADL participation. DC plan remains appropriate, will follow acutely per POC.     Follow Up Recommendations  Home health OT;Supervision/Assistance - 24 hour    Equipment Recommendations  3 in 1 bedside commode;Wheelchair (measurements OT);Wheelchair cushion (measurements OT)    Recommendations for Other Services      Precautions / Restrictions Precautions Precautions: Fall Precaution Comments: watch 02 Restrictions Weight  Bearing Restrictions: No       Mobility Bed Mobility Overal bed mobility: Needs Assistance Bed Mobility: Supine to Sit     Supine to sit: Min guard;HOB elevated     General bed mobility comments: pt requires min guard assist and increased time to complete bed mobility with HOB elevated. pt motivated to complete bed mobility independently and declined physical assist from OTA. heavy use of bed rails. pt reports adjustable bed is being delievered  Transfers Overall transfer level: Needs assistance Equipment used: Rolling walker (2 wheeled) Transfers: Sit to/from [facility] Sit to Stand: Min assist;Mod assist Stand pivot transfers: Min assist       General transfer comment: pt prefers to pull up on RW needing MIN- MOD A to power into standing as pt fatigues as session continues. Pt able to pivot from EOB>recliner with MIN A with RW. pt sit<>stand from recliner x3 with MODA    Balance Overall balance assessment: Needs assistance Sitting-balance support: Feet supported;No upper extremity supported Sitting balance-[name] Scale: Good     Standing balance support: Bilateral upper extremity supported Standing balance-[name] Scale: Poor Standing balance comment: reliant on B UE support                           ADL either performed or assessed with clinical judgement   ADL Overall ADL's : Needs assistance/impaired                     Lower Body Dressing: Total assistance;Sitting/lateral leans Lower Body Dressing Details (indicate cue type and reason): to don shoes from recliner. pt reports he was able to complete independently at home but currently  requires total A as energy conservation strategy. wife reports she can assist with LB ADLS at home Toilet Transfer: Minimal assistance;Moderate assistance;RW;Stand-pivot Toilet Transfer Details (indicate cue type and reason): MOD A to power up in standing. pt prefers to pull up on RW however education  provided on pushing up from sitting surface. MIN A to complete stand pivot to recliner for RW mgmt and safety. pt requires frequent reminders to complete PLB during transfers. pt on 3L throughout session with O2 dropping to 88% however pt is symptomatic during mobility requesting to sit down         Functional mobility during ADLs: Minimal assistance;Moderate assistance;Rolling walker (MIN A for stand pivot; MOD A for sit<>stand with RW) General ADL Comments: continued reeducation on PLB, use of IS, checking O2 sats with pulse ox. pt with good carryover of education. pt on 3L throughout session with O2 dropping to 88% with pt symptomatic requesting to sit down. pt did sit<>stand x3 from relciner with MOD A. pt wants to pull up on RW.     Vision       Perception     Praxis      Cognition Arousal/Alertness: Awake/alert Behavior During Therapy: WFL for tasks assessed/performed Overall Cognitive Status: Within Functional Limits for tasks assessed                               Problem Solving: Slow processing General Comments: pt overall slow to process and is easily distracted but pt is likely at baseline        Exercises Other Exercises Other Exercises: encouraged pt to work on chair push ups from recliner x10 reps   Shoulder Instructions       General Comments pt on 3L during session with O2 dropping to 88% with pt symptomatic. Extensive education on PLB, energy conservations, using pulse ox at home to check OS and use of IS. wife present during session very supportive and helpful    Pertinent Vitals/ Pain       Pain Assessment: No/denies pain  Home Living                                          Prior Functioning/Environment              Frequency  Min 2X/week        Progress Toward Goals  OT Goals(current goals can now be found in the care plan section)  Progress towards OT goals: Progressing toward goals  Acute Rehab OT  Goals Patient Stated Goal: Start trying to do more for myself. OT Goal Formulation: With patient Time For Goal Achievement: 10/05/20 Potential to Achieve Goals: Good  Plan Discharge plan remains appropriate;Frequency remains appropriate    Co-evaluation                 AM-PAC OT "6 Clicks" Daily Activity     Outcome Measure   Help from another person eating meals?: None Help from another person taking care of personal grooming?: A Little Help from another person toileting, which includes using toliet, bedpan, or urinal?: A Lot Help from another person bathing (including washing, rinsing, drying)?: A Lot Help from another person to put on and taking off regular upper body clothing?: A Little Help from another person to put on and taking off regular lower body  clothing?: Total 6 Click Score: 15    End of Session Equipment Utilized During Treatment: Gait belt;Rolling walker;Oxygen;Other (comment) (3L)  OT Visit Diagnosis: Unsteadiness on feet (R26.81);Muscle weakness (generalized) (M62.81)   Activity Tolerance Patient tolerated treatment well   Patient Left in chair;with call bell/phone within reach;with family/visitor present;Other (comment) (chair alarm off and RN aware; encouraged pt to work on chair push ups)   Nurse Communication Mobility status        Time: [id number] OT Time Calculation (min): 57 min  Charges: OT General Charges $OT Visit: 1 Visit OT Treatments $Self Care/Home Management : 23-37 mins $Therapeutic Activity: 23-37 mins  Lanier Clam., COTA/L Acute Rehabilitation Services [phone]    Ihor Gully 09/28/2020, 10:35 AM

## 2020-09-28 NOTE — Plan of Care (Signed)

## 2020-09-28 NOTE — TOC Progression Note (Signed)
Transition of Care [facility]) - Progression Note    Patient Details  Name: [name] MRN: [id number] Date of Birth: 1946-07-25  [facility] CM/SW Contact  Zenon Mayo, RN Phone Number: 09/28/2020, 1:08 PM  Clinical Narrative:    NCM received consult that patient will need home oxygen set up , NCM spoke with patient who states to talk to wife.  NCM spoke with wife who states it is ok to work with [employer] for the home oxygen.  NCM made referral to [facility] with [employer],  He states the concentrator will go up to 10 liters and the order is for 6 liters.    Expected Discharge Plan: [place] Barriers to Discharge: Continued Medical Work up  Expected Discharge Plan and Services Expected Discharge Plan: [place] In-house Referral: NA Discharge Planning Services: CM Consult Post Acute Care Choice: NA Living arrangements for the past 2 months: Single Family Home Expected Discharge Date: 09/26/20                 DME Agency: NA       HH Arranged: PT [place] Agency: [facility] [employer]) Date [place]: 09/02/20 Time [place]: 1000 Representative spoke with at [place]: [place] ([place]) Interventions    Readmission Risk Interventions Readmission Risk Prevention Plan 08/31/2020  Transportation Screening Complete  Medication Review Press photographer) Complete  HRI or [place] Not Applicable  Some recent data might be hidden

## 2020-09-28 NOTE — Care Management Important Message (Signed)
Important Message  Patient Details  Name: BRACY PEPPER MRN: [id number] Date of Birth: 28-Oct-1946   Medicare Important Message Given:  Yes     Shelda Altes 09/28/2020, 2:17 PM

## 2020-09-29 NOTE — Telephone Encounter (Signed)
See 09/29/20 telephone note.

## 2020-09-29 NOTE — Telephone Encounter (Signed)
Spoke to patient's wife.She stated husband was discharged from [place] yesterday.Stated he continues to have sob with exertion.He is on 1L of O2.He is presently resting well.He is scheduled to have a bmet done 10/15 with PCP.He was told to see Dr.Jordan after results.Appointment scheduled with Dr.Jordan 10/21 at 10:00 am.Advised to have PCP fax bmet results.

## 2020-10-04 NOTE — Progress Notes (Signed)
Cardiology Office Note    Date:  10/08/2020   ID:  Ameir, Faria 1946/09/14, MRN [id number]  PCP:  [name], MD  Cardiologist:  Dr. Modestine Scherzinger Martinique   No chief complaint on file.   History of Present Illness:  [name] is a 74 y.o. male with PMH of CAD s/p remote anterior MI, ICM s/p CRT-D, atrial fibrillation, and HLD. He had generator change placed in September 2014. He is intolerant of amiodarone. His QT interval was too long for Tikosyn. He previously attempted to increase his carvedilol dose, however developed significant hypotension.   In October 2017, he had volume overloaded indicated by his Optival reading, Lasix was increased to 40 mg twice a day. He was started on Entresto but developed hypotension, he was later transitioned back to losartan. Due to persistent atrial fibrillation with elevated heart rate, he underwent AV nodal ablation in February 2018.  He was admitted to the hospital on 09/30/2017 with renal insufficiency. He reported episode of weakness. He was also treated for SIRS and sepsis with temp 102 and tachypnea. White blood cell count was 16.6. Initial lactic acid 2.14. He was hydrated with IV fluid. Troponin was borderline elevated, this was felt to be related to elevation of creatinine up to 2.71. He was treated with vancomycin, Levaquin and aztreonam. Culture was negative. Repeat echocardiogram obtained on 10/01/2017 continues showed EF 25-30%, mild MR, mild-to-moderate TR, akinesis of the apical anterior, lateral, inferolateral, inferior, apical myocardium with no evidence of apical thrombus by Definity contrast, PA peak pressure 37 mmHg.  He was seen in July 2020 with some fluid accumulation. Lasix increased for 3 days. Last ICD check April 1 showed normal Optivol. He had generator change out in May. In July he was seen by Dr Fletcher Anon for abnormal LE dopplers with occluded SFA. No real symptoms or signs of critical limb ischemia so medical therapy  recommended.   He was admitted from 9/7-10/11/21 with COVID PNA. He was treated with remdisivir and steroids. Required high flow oxygen. Was diuresed aggressively. He had bilateral DVTs- peroneal veins and was switched from coumadin to Eliquis. CT was negative for PE. Losartan was held. He was in AFib with controlled response. Echo was unchanged with EF of 25-30%. Creatinine at discharge was 1.56. DC on lasix 20 mg bid which is significantly lower dose of diuretics than in the past.  On follow up today he is seen with his wife. He is doing better. Oxygen requirement down to 2 L/[place]. No cough. Breathing is improved. Mild swelling in right LE. Weight is down almost 30 lbs from before hospitalization. He is getting PT. Wife is making sure he is getting up every 2 hours. Strict sodium restriction.       Past Medical History:  Diagnosis Date  . AICD (automatic cardioverter/defibrillator) present   . Anemia   . Anxiety   . CHF (congestive heart failure) ([place])   . Chronic renal insufficiency   . Coronary artery disease   . Depression   . Dyslipidemia   . Dysrhythmia    atrial fibrillation  . ED (erectile dysfunction)   . GERD (gastroesophageal reflux disease)   . Incisional hernia, without obstruction or gangrene 04/10/2017  . Ischemic cardiomyopathy    EF 23%  . MI (myocardial infarction) ([place]) 2000   ANTERIOR, s/p PCI  . Other primary cardiomyopathies   . Peripheral vascular disease ([place])    dvt legs 2017  . Persistent atrial fibrillation ([place])  on coumadin  . Presence of permanent cardiac pacemaker   . Shortness of breath dyspnea    on exertion    Past Surgical History:  Procedure Laterality Date  . AV NODE ABLATION N/A 01/25/2017   Procedure: AV Node Ablation;  Surgeon: [name], MD;  Location: [facility];  Service: Cardiovascular;  Laterality: N/A;  . AV NODE ABLATION  01/25/2017  . BIV ICD GENERATOR CHANGEOUT N/A 04/24/2020   Procedure: BIV ICD GENERATOR  CHANGEOUT;  Surgeon: [name], MD;  Location: [facility];  Service: Cardiovascular;  Laterality: N/A;  . BIV ICD GENERTAOR CHANGE OUT N/A 09/16/2013   Procedure: BIV ICD GENERTAOR CHANGE OUT;  Surgeon: [name], MD;  Location: [facility] CATH LAB;  Service: Cardiovascular;  Laterality: N/A;  . BREAST LUMPECTOMY Left   . CARDIAC CATHETERIZATION  02/05/2008   MODERATE TO SEVERE LEFT VENTRICULAR  DYSFUNCTION WITH EF 25-30%  . CARDIAC DEFIBRILLATOR PLACEMENT  2009   MDT BI-VENTRICULAR ICD by Dr Caryl Comes  . COLONOSCOPY WITH PROPOFOL N/A 08/31/2016   Procedure: COLONOSCOPY WITH PROPOFOL;  Surgeon: Wilford Corner, MD;  Location: [facility] ENDOSCOPY;  Service: Endoscopy;  Laterality: N/A;  . CORONARY ANGIOPLASTY  2000   stent  . CORONARY STENT PLACEMENT     LAD  . CYSTOSCOPY N/A 01/25/2016   Procedure: CYSTOSCOPY;  Surgeon: Carolan Clines, MD;  Location: [facility];  Service: Urology;  Laterality: N/A;  . ESOPHAGOGASTRODUODENOSCOPY (EGD) WITH PROPOFOL N/A 08/31/2016   Procedure: ESOPHAGOGASTRODUODENOSCOPY (EGD) WITH PROPOFOL;  Surgeon: Wilford Corner, MD;  Location: [facility] ENDOSCOPY;  Service: Endoscopy;  Laterality: N/A;  . FRACTURE SURGERY Right 1972   leg  . IMPLANTABLE CARDIOVERTER DEFIBRILLATOR GENERATOR CHANGE  2014  . INCISIONAL HERNIA REPAIR N/A 04/10/2017   Procedure: OPEN REPAIR INCISIONAL VENTRAL HERNIA;  Surgeon: [name], MD;  Location: [facility];  Service: General;  Laterality: N/A;  . INSERT / REPLACE / REMOVE PACEMAKER     ICD  . INSERTION OF MESH N/A 04/10/2017   Procedure: INSERTION OF MESH;  Surgeon: [name], MD;  Location: [facility];  Service: General;  Laterality: N/A;  . LAPAROTOMY N/A 02/22/2016   Procedure: EXPLORATORY LAPAROTOMY WITH  PATCH OF DUODENAL ULCER;  Surgeon: Rolm Bookbinder, MD;  Location: [facility];  Service: General;  Laterality: N/A;  . MULTIPLE TOOTH EXTRACTIONS    . TRANSTHORACIC ECHOCARDIOGRAM  12/2010   EF 30-35%  . TRANSURETHRAL RESECTION OF PROSTATE N/A  01/25/2016   Procedure: TRANSURETHRAL RESECTION OF THE PROSTATE (TURP);  Surgeon: Carolan Clines, MD;  Location: [facility];  Service: Urology;  Laterality: N/A;  . WRIST FRACTURE SURGERY Left 1972    Current Medications: Outpatient Medications Prior to Visit  Medication Sig Dispense Refill  . acetaminophen (TYLENOL) 500 MG tablet Take 500 mg by mouth every 8 (eight) hours as needed for mild pain or moderate pain.     Marland Kitchen albuterol (VENTOLIN HFA) 108 (90 Base) MCG/ACT inhaler Inhale 2 puffs into the lungs every 4 (four) hours as needed for wheezing or shortness of breath. 8 g 0  . apixaban (ELIQUIS) 5 MG TABS tablet Take 1 tablet (5 mg total) by mouth 2 (two) times daily. 60 tablet 0  . digoxin (LANOXIN) 0.125 MG tablet Take 1 tablet (0.125 mg total) by mouth daily. (Patient taking differently: Take 0.0625 mg by mouth every other day. ) 90 tablet 3  . fenofibrate 160 MG tablet TAKE 1 TABLET BY MOUTH  DAILY (Patient taking differently: Take 160 mg  by mouth daily at 2 PM. ) 90 tablet 2  . ferrous sulfate 325 (65 FE) MG tablet Take 325 mg by mouth 2 (two) times daily.    . folic acid (FOLVITE) 161 MCG tablet Take 800 mcg by mouth every evening.     . furosemide (LASIX) 20 MG tablet Take 1 tablet (20 mg total) by mouth 2 (two) times daily. with food 60 tablet 0  . Magnesium 250 MG TABS Take 500 mg by mouth daily.    . metoprolol succinate (TOPROL-XL) 25 MG 24 hr tablet TAKE 1 TABLET BY MOUTH  DAILY AT 12 NOON. (Patient taking differently: Take 25 mg by mouth daily. ) 90 tablet 2  . Multiple Vitamins-Minerals (MULTIVITAMIN ADULTS PO) Take 1 tablet by mouth daily. Senior    . pantoprazole (PROTONIX) 40 MG tablet TAKE 2 TABLETS BY MOUTH  DAILY (Patient taking differently: Take 40 mg by mouth 2 (two) times daily. ) 180 tablet 3  . potassium chloride SA (KLOR-CON) 20 MEQ tablet TAKE 1 TABLET BY MOUTH  DAILY. TAKE ADDITIONAL  TABLET WHEN TAKING EXTRA  LASIX (FUROSEMIDE) (Patient taking differently: Take 70 mEq  by mouth. ) 135 tablet 3  . pravastatin (PRAVACHOL) 40 MG tablet TAKE 1 TABLET BY MOUTH  DAILY (Patient taking differently: Take 40 mg by mouth daily. ) 90 tablet 3  . saccharomyces boulardii (FLORASTOR) 250 MG capsule Take 1 capsule (250 mg total) by mouth 2 (two) times daily for 14 days. 28 capsule 0  . sertraline (ZOLOFT) 100 MG tablet Take 150 mg by mouth daily at 2 PM.     . vitamin C (ASCORBIC ACID) 250 MG tablet Take 250 mg by mouth 2 (two) times daily.    . nitroGLYCERIN (NITROSTAT) 0.4 MG SL tablet Place 1 tablet (0.4 mg total) under the tongue every 5 (five) minutes as needed for chest pain. 25 tablet 11   No facility-administered medications prior to visit.     Allergies:   Keflex [cephalexin]   Social History   Socioeconomic History  . Marital status: Married    Spouse name: Not on file  . Number of children: 2  . Years of education: Not on file  . Highest education level: Not on file  Occupational History  . Occupation: disabled    Employer: RETIRED  Tobacco Use  . Smoking status: Former Smoker    Types: Cigarettes    Quit date: 12/23/1998    Years since quitting: 21.8  . Smokeless tobacco: Never Used  Vaping Use  . Vaping Use: Never used  Substance and Sexual Activity  . Alcohol use: No  . Drug use: No  . Sexual activity: Not on file  Other Topics Concern  . Not on file  Social History Narrative   Lives in [place].  Retired Teacher, English as a foreign language         Social Determinants of Radio broadcast assistant Strain:   . Difficulty of Paying Living Expenses: Not on file  Food Insecurity:   . Worried About Charity fundraiser in the Last Year: Not on file  . Ran Out of Food in the Last Year: Not on file  Transportation Needs:   . Lack of Transportation (Medical): Not on file  . Lack of Transportation (Non-Medical): Not on file  Physical Activity:   . Days of Exercise per Week: Not on file  . Minutes of Exercise per Session: Not on file  Stress:   . Feeling  of Stress : Not  on file  Social Connections:   . Frequency of Communication with Friends and Family: Not on file  . Frequency of Social Gatherings with Friends and Family: Not on file  . Attends Religious Services: Not on file  . Active Member of Clubs or Organizations: Not on file  . Attends Archivist Meetings: Not on file  . Marital Status: Not on file     Family History:  The patient's family history includes Breast cancer in his mother and sister; Depression in his mother; Heart attack in his father.   ROS:   Please see the history of present illness.    ROS All other systems reviewed and are negative.   PHYSICAL EXAM:   VS:  BP (!) 118/58   Pulse 71   Ht 5\' 10"  (1.778 m)   Wt 182 lb (82.6 kg)   SpO2 97%   BMI 26.11 kg/m    GENERAL:  Well appearing  WM in NAD. On oxygen HEENT:  PERRL, EOMI, sclera are clear. Oropharynx is clear. NECK:  No jugular venous distention, carotid upstroke brisk and symmetric, no bruits, no thyromegaly or adenopathy LUNGS:  Clear to auscultation bilaterally CHEST:  Unremarkable HEART:  IRRR,  PMI not displaced or sustained,S1 and S2 within normal limits, no S3, no S4: no clicks, no rubs, no murmurs ABD:  Soft, nontender. BS +, no masses or bruits. No hepatomegaly, no splenomegaly EXT:  2 + pulses throughout, trace right pretibial  edema, no cyanosis no clubbing. SKIN:  Warm and dry.  No rashes NEURO:  Alert and oriented x 3. Cranial nerves II through XII intact. PSYCH:  Cognitively intact   Wt Readings from Last 3 Encounters:  10/08/20 182 lb (82.6 kg)  09/28/20 176 lb (79.8 kg)  08/04/20 213 lb (96.6 kg)      Studies/Labs Reviewed:   EKG:  EKG is not ordered today.     Recent Labs: 08/25/2020: B Natriuretic Peptide 429.0 09/03/2020: ALT 11 09/26/2020: Hemoglobin 11.5; Platelets 385 09/28/2020: BUN 21; Creatinine, Ser 1.56; Magnesium 2.1; Potassium 3.6; Sodium 142   Lipid Panel    Component Value Date/Time   CHOL 112  02/27/2016 0500   TRIG 139 08/26/2020 1200   HDL 32 (L) 02/27/2016 0500   CHOLHDL 3.5 02/27/2016 0500   VLDL 14 02/27/2016 0500   LDLCALC 66 02/27/2016 0500   LDLDIRECT 137.6 01/06/2012 1029   Labs dated 05/01/18: BUN 41, creatinine 1.72. Other chemistries normal. Dig level 0.2. WBC 7.7. Hgb 11.8. Dated 10/08/18: INR 2.3. Hgb 11.8. BUN 32, creatinine 1.48. Other chemistries normal.  Dated 12/20/18: Dig level < 0.4. Dated 04/29/19: Hgb 12.1. BUN 30, creatinine 1.47. other chemistries normal Dated 10/05/20: Hgb 12.2. BUN 19, creatinine 1.47. potassium 4.7. sodium 143.   Additional studies/ records that were reviewed today include:   Echo 10/01/2017 LV EF: 25% -   30%  Study Conclusions  - Left ventricle: The cavity size was normal. Systolic function was   severely reduced. The estimated ejection fraction was in the   range of 25% to 30%. There is akinesis of the apical anterior,   lateral, inferolateral, inferior, and apical myocardium with no   evidence of apical thrombus by definity contrast. The study is   not technically sufficient to allow evaluation of LV diastolic   function. Doppler parameters are consistent with high ventricular   filling pressure. - Mitral valve: There was mild regurgitation. - Left atrium: The atrium was mildly dilated. - Tricuspid valve: There was  mild-moderate regurgitation. - Pulmonic valve: There was trivial regurgitation. - Pulmonary arteries: PA peak pressure: 37 mm Hg (S).  Impressions:  - The right ventricular systolic pressure was increased consistent   with mild pulmonary hypertension.   Echo 09/17/20: IMPRESSIONS    1. There is akinesis of all mid-to-distal septal, mid-to-distal inferior,  mid-to-distal anterior and apical segments. All other segments are  severely hypokinetic.Marland Kitchen Left ventricular ejection fraction, by estimation,  is 25 to 30%. The left ventricle has  severely decreased function. The left ventricular internal cavity  size was  mildly dilated.  2. Right ventricular systolic function is normal. The right ventricular  size is normal. There is mildly elevated pulmonary artery systolic  pressure.  3. The aortic valve is tricuspid. There is mild calcification of the  aortic valve. There is mild thickening of the aortic valve. Aortic valve  regurgitation is not visualized.   Comparison(s): No significant change from prior study. Compared to prior  TTE in 2018, there is no significant change.   ASSESSMENT:    1. Chronic systolic CHF (congestive heart failure) ([place])   2. Permanent atrial fibrillation ([place])   3. Ischemic cardiomyopathy   4. Biventricular implantable cardioverter-defibrillator Medtronic   5. Chronic venous insufficiency      PLAN:  In order of problems listed above:  1. Chronic systolic heart failure: EF 25-30%. weight is down considerably from recent Covid infection. On lower dose of lasix. Appears euvolemic. Will resume losartan at 25 mg daily since renal function is at baseline. Was 1.6 prior to hospital stay. Continue sodium and fluid restriction. Repeat BMET in a couple of weeks. Follow up with me in 2 months.  2. ICM s/p ICD:   Device check in August with  good with BiV pacing.   3. CAD: Denies any anginal symptoms.  4. COVID 19 PNA- improving. Hopefully with more time for recovery and PT will be able to wean oxygen.  5. Persistent atrial fibrillation: He had AV nodal ablation in early 2018.  Continue Eliquis. Rate is controlled. BiV paced.   6. CKD stage 3. Back at baseline. Will monitor with resumption of losartan  7.   Bilateral peroneal vein DVT. Secondary to immobility and COVID 19 infection. Continue Eliquis indefinitely due to Afib. He asked about an IVC filter and there is no indication for this.   8.   Hypercholesterolemia on Pravastatin.    Medication Adjustments/Labs and Tests Ordered: Current medicines are reviewed at length with the patient today.  Concerns  regarding medicines are outlined above.  Medication changes, Labs and Tests ordered today are listed in the Patient Instructions below. Patient Instructions  Resume losartan     Signed, Trexton Escamilla Martinique, MD  10/08/2020 11:04 AM    [facility] [address], [place], [place]  [address] Phone: [phone]; Fax: [phone]

## 2020-10-08 ENCOUNTER — Ambulatory Visit: Payer: Medicare Other | Admitting: Cardiology

## 2020-10-08 ENCOUNTER — Encounter: Payer: Self-pay | Admitting: Cardiology

## 2020-10-08 ENCOUNTER — Other Ambulatory Visit: Payer: Self-pay

## 2020-10-08 VITALS — BP 118/58 | HR 71 | Ht 70.0 in | Wt 182.0 lb

## 2020-10-08 DIAGNOSIS — I255 Ischemic cardiomyopathy: Secondary | ICD-10-CM | POA: Diagnosis not present

## 2020-10-08 DIAGNOSIS — Z95810 Presence of automatic (implantable) cardiac defibrillator: Secondary | ICD-10-CM | POA: Diagnosis not present

## 2020-10-08 DIAGNOSIS — I4821 Permanent atrial fibrillation: Secondary | ICD-10-CM

## 2020-10-08 DIAGNOSIS — I5022 Chronic systolic (congestive) heart failure: Secondary | ICD-10-CM | POA: Diagnosis not present

## 2020-10-08 DIAGNOSIS — I872 Venous insufficiency (chronic) (peripheral): Secondary | ICD-10-CM

## 2020-10-08 MED ORDER — LOSARTAN POTASSIUM 25 MG PO TABS: 25.0000 mg | ORAL_TABLET | Freq: Every day | ORAL | 3 refills | Status: AC

## 2020-10-08 MED ORDER — NITROGLYCERIN 0.4 MG SL SUBL: 0.4000 mg | SUBLINGUAL_TABLET | SUBLINGUAL | 11 refills | Status: AC | PRN

## 2020-10-08 NOTE — Patient Instructions (Addendum)
Resume losartan 25 mg daily  Recheck BMET (kidney function) in 2-3 weeks.  We will follow up in 2 months.

## 2020-10-12 ENCOUNTER — Telehealth: Payer: Self-pay | Admitting: Cardiology

## 2020-10-12 NOTE — Telephone Encounter (Signed)
Pt's wife called and said that she wants to confirm the date or dates for her husband to come. Please call back

## 2020-10-12 NOTE — Telephone Encounter (Signed)
10/19/2020 ICM check canceled and changed to correlate with 10/23/2020 remote transmission check.

## 2020-10-12 NOTE — Telephone Encounter (Signed)
Spoke with patient's wife advised patient has follow up appointment scheduled with Dr.Jordan 11/19/20 at 9:00 am.Stated he is scheduled 2 device checks 11/1 and 11/4.Advised I will send message to Device clinic for advice.

## 2020-10-12 NOTE — Telephone Encounter (Signed)
[name]- Can you check into why he is scheduled for 11/1 and 11/5?  It looks like the 11/1 appt can be cancelled, but I didn't want to do that if it affects you.

## 2020-10-14 ENCOUNTER — Other Ambulatory Visit: Payer: Self-pay | Admitting: Cardiology

## 2020-10-15 ENCOUNTER — Other Ambulatory Visit: Payer: Self-pay | Admitting: Cardiology

## 2020-10-15 MED ORDER — APIXABAN 5 MG PO TABS
5.0000 mg | ORAL_TABLET | Freq: Two times a day (BID) | ORAL | 2 refills | Status: AC
Start: 2020-10-15 — End: ?

## 2020-10-15 NOTE — Telephone Encounter (Signed)
   *  STAT* If patient is at the pharmacy, call can be transferred to refill team.   1. Which medications need to be refilled? (please list name of each medication and dose if known)    apixaban (ELIQUIS) 5 MG TABS tablet    2. Which pharmacy/location (including street and city if local pharmacy) is medication to be sent to? [place], [address], [address]  3. Do they need a 30 day or 90 day supply? 90 days

## 2020-10-15 NOTE — Telephone Encounter (Signed)
Rx has been sent to the pharmacy electronically. ° °

## 2020-10-16 ENCOUNTER — Telehealth: Payer: Self-pay

## 2020-10-16 NOTE — Telephone Encounter (Signed)
Returned patient call as requested by voice mail message regarding remote transmission schedule.  Advised next remote transmission will be on 10/23/2020 and he agreed that would be fine.  No further questions and he is feeling fine today.

## 2020-10-23 ENCOUNTER — Telehealth: Payer: Self-pay | Admitting: Emergency Medicine

## 2020-10-23 ENCOUNTER — Telehealth: Payer: Self-pay

## 2020-10-23 ENCOUNTER — Encounter: Payer: Self-pay | Admitting: Cardiology

## 2020-10-23 ENCOUNTER — Ambulatory Visit (INDEPENDENT_AMBULATORY_CARE_PROVIDER_SITE_OTHER): Payer: Medicare Other

## 2020-10-23 ENCOUNTER — Other Ambulatory Visit: Payer: Self-pay

## 2020-10-23 DIAGNOSIS — Z95810 Presence of automatic (implantable) cardiac defibrillator: Secondary | ICD-10-CM

## 2020-10-23 DIAGNOSIS — I420 Dilated cardiomyopathy: Secondary | ICD-10-CM | POA: Diagnosis not present

## 2020-10-23 DIAGNOSIS — I5022 Chronic systolic (congestive) heart failure: Secondary | ICD-10-CM

## 2020-10-23 LAB — CUP PACEART REMOTE DEVICE CHECK
Battery Remaining Longevity: 85 mo
Battery Voltage: 3.01 V
Brady Statistic AP VP Percent: 0 %
Brady Statistic AP VS Percent: 0 %
Brady Statistic AS VP Percent: 0 %
Brady Statistic AS VS Percent: 0 %
Brady Statistic RA Percent Paced: 0 %
Brady Statistic RV Percent Paced: 95.5 %
Date Time Interrogation Session: 20211105022703
HighPow Impedance: 43 Ohm
HighPow Impedance: 55 Ohm
Implantable Lead Implant Date: 20090226
Implantable Lead Implant Date: 20090226
Implantable Lead Implant Date: 20090226
Implantable Lead Location: 753858
Implantable Lead Location: 753859
Implantable Lead Location: 753860
Implantable Lead Model: 4194
Implantable Lead Model: 5076
Implantable Lead Model: 6947
Implantable Pulse Generator Implant Date: 20210507
Lead Channel Impedance Value: 228 Ohm
Lead Channel Impedance Value: 285 Ohm
Lead Channel Impedance Value: 361 Ohm
Lead Channel Impedance Value: 513 Ohm
Lead Channel Impedance Value: 513 Ohm
Lead Channel Impedance Value: 646 Ohm
Lead Channel Pacing Threshold Amplitude: 1 V
Lead Channel Pacing Threshold Amplitude: 1.125 V
Lead Channel Pacing Threshold Pulse Width: 0.4 ms
Lead Channel Pacing Threshold Pulse Width: 0.6 ms
Lead Channel Sensing Intrinsic Amplitude: 10.375 mV
Lead Channel Sensing Intrinsic Amplitude: 10.375 mV
Lead Channel Setting Pacing Amplitude: 2.25 V
Lead Channel Setting Pacing Amplitude: 2.5 V
Lead Channel Setting Pacing Pulse Width: 0.4 ms
Lead Channel Setting Pacing Pulse Width: 0.6 ms
Lead Channel Setting Sensing Sensitivity: 0.3 mV

## 2020-10-23 NOTE — Progress Notes (Addendum)
EPIC Encounter for ICM Monitoring  [name] is a 74 y.o. male Date: 10/23/2020 Primary Care Physican: [name], MD Primary [place] Electrophysiologist: Vergie Living Pacing: 96.4%  09/28/2020 Hospital discharge weight 176 lbs 11/5/2021Weight:184lbs    Spoke with patient.  Since contracting COVID he is on oxygen full time.  He reports the physicians are monitoring the creatinine and labs due on 11/15.  He has swelling of feet/legs and wearing compression socks. Weight has increased from 176 lbs to 184 pounds since 09/28/2020 hospital discharge.  Optivol thoracic impedancesuggesting possible fluid accumulation since 10/02/2020.  Prescribed:   Furosemide20 mgTake 1 tablet (20 mg ) twice a day with food.      Potassium 20 mEqTAKE 1 TABLET BY MOUTH DAILY. TAKE ADDITIONAL TABLET WHEN TAKING EXTRA LASIX.   Labs: 09/28/2020 Creatinine 1.56, BUN 21, Potassium 3.6, Sodium 142, GFR 44 09/27/2020 Creatinine 1.68, BUN 23, Potassium 3.4, Sodium 140, GFR 40  09/26/2020 Creatinine 1.52, BUN 21, Potassium 3.7, Sodium 142, GFR 45  09/25/2020 Creatinine 1.46, BUN 23, Potassium 3.0, Sodium 140  09/24/2020 Creatinine 1.47, BUN 21, Potassium 3.3, Sodium 142  09/23/2020 Creatinine 1.40, BUN 16, Potassium 3.8, Sodium 141  09/22/2020 Creatinine 1.32, BUN 21, Potassium 3.6, Sodium 142  09/21/2020 Creatinine 1.34, BUN 22, Potassium 3.4, Sodium 142  09/20/2020 Creatinine 1.06, BUN 21, Potassium 3.7, Sodium 141  A complete set of results can be found in Results Review.  Recommendations:Confirmed Furosemide and Potassium dosages today.  He reports limiting salt intake.    Follow-up plan: ICM clinic phone appointment on11/08/2020. 91 day device clinic remote transmission2/03/2021.   EP/Cardiology Office Visits:  11/19/2020 with Dr Martinique    Copy of ICM check sent to Dr. Caryl Comes and phone note sent to Dr Doug Sou office triage for review  and recommendations since Dr Martinique and Dr Caryl Comes are currently out of the office.   3 month ICM trend: 10/23/2020    1 Year ICM trend:       [name], RN 10/23/2020 7:59 AM

## 2020-10-23 NOTE — Telephone Encounter (Signed)
ICM call to patient due to abnormal Optivol remote transmission results.  Since contracting COVID pt reports he is on oxygen full time.  He reports the physicians are monitoring the creatinine and labs due on 11/15.   He has swelling of feet/legs and wearing compression socks.  Weight has increased from 176 lbs to 184 pounds since 09/28/2020 hospital discharge.   Optivol thoracic impedancesuggesting possible fluid accumulation since 10/02/2020.  Prescribed:   Furosemide20 mgTake 1 tablet (20 mg ) twice a day with food.      Potassium 20 mEqTAKE 1 TABLET BY MOUTH DAILY. TAKE ADDITIONAL TABLET WHEN TAKING EXTRA LASIX.   Labs: 09/28/2020 Creatinine 1.56, BUN 21, Potassium 3.6, Sodium 142, GFR 44 09/27/2020 Creatinine 1.68, BUN 23, Potassium 3.4, Sodium 140, GFR 40  09/26/2020 Creatinine 1.52, BUN 21, Potassium 3.7, Sodium 142, GFR 45  09/25/2020 Creatinine 1.46, BUN 23, Potassium 3.0, Sodium 140  09/24/2020 Creatinine 1.47, BUN 21, Potassium 3.3, Sodium 142  09/23/2020 Creatinine 1.40, BUN 16, Potassium 3.8, Sodium 141  09/22/2020 Creatinine 1.32, BUN 21, Potassium 3.6, Sodium 142  09/21/2020 Creatinine 1.34, BUN 22, Potassium 3.4, Sodium 142  09/20/2020 Creatinine 1.06, BUN 21, Potassium 3.7, Sodium 141  A complete set of results can be found in Results Review.  Recommendations:Confirmed Furosemide and Potassium dosages today.  He reports limiting salt intake. Advised will call back with any recommendations given by Dr Doug Sou office or Dr Doug Sou office may call him.   EP/Cardiology Office Visits:  11/19/2020 with Dr Martinique    Copy of ICM check sent to Dr. Caryl Comes and Dr Doug Sou office triage for review and recommendations since Dr Martinique and Dr Caryl Comes are currently out of the office.   3 month ICM trend: 10/23/2020

## 2020-10-23 NOTE — Telephone Encounter (Signed)
Deberah Pelton, NP  [facility] Scheduling 9 minutes ago (11:19 AM)   Please schedule this patient an appointment with APP or Dr. Martinique in the next week. He may be seen in the office on [address] or at [facility]. Thank you.     Deberah Pelton, NP  You 10 minutes ago (11:17 AM)   Salley Scarlet Margarita Grizzle,   I have reviewed Mr. Fleagle message. Given that he has gained 8 pounds since discharge. His prescribed furosemide regimen is not balanced with his fluid intake. We will double his furosemide for 3 days. We will also double his potassium for 3 days. After 3 days he will need to return to his current dosing with furosemide and potassium. Please schedule an appointment with him in the next week and have a BMP drawn in the next week. He needs to weigh himself daily and contact our office with a weight increase of 3 pounds overnight or 5 pounds in a week. He will need to limit his fluid intake to 48-64 ounces daily. He also needs to limit his sodium intake to less than 1800 mg/day. Thank you.

## 2020-10-23 NOTE — Telephone Encounter (Signed)
Error

## 2020-10-23 NOTE — Telephone Encounter (Signed)
Call to patient.  Advised of Chales Abrahams NP recommendations.   1. Double his furosemide for 3 days.  He will take Furosemide 2 tablets twice a day x 3 days 2. Double his potassium for 3 days. He will take Potassium 1 tablet twice a day x 3 days      After 3 days he will need to return to his current dosing with furosemide and potassium.  3. [facility] office scheduler will contact him to schedule an appointment with either Dr Martinique or PA/NP for the next week. 4. BMP drawn in the next week on 10/30/2020.  He reports he will have drawn at either PCP office or [facility].  Advised PCP office may not have access to BMET order.  5. Weigh himself daily and contact our office with a weight increase of 3 pounds overnight or 5 pounds in a week.  6. Limit his fluid intake to 48-64 ounces daily and sodium intake to less than 1800 mg/day.   Patient repeated instructions back correctly and verbalized understanding.    Advised to call back with any questions.

## 2020-10-23 NOTE — Progress Notes (Signed)
Call to patient.  Advised of Chales Abrahams NP recommendations.   1. Double his furosemide for 3 days.  He will take Furosemide 2 tablets twice a day x 3 days 2. Double his potassium for 3 days. He will take Potassium 1 tablet twice a day x 3 days      After 3 days he will need to return to his current dosing with furosemide and potassium.  3. [facility] office scheduler will contact him to schedule an appointment with either Dr Martinique or PA/NP for the next week. 4. BMP drawn in the next week on 10/30/2020.  He reports he will have drawn at either PCP office or [facility].  Advised PCP office may not have access to BMET order.  5. Weigh himself daily and contact our office with a weight increase of 3 pounds overnight or 5 pounds in a week.  6. Limit his fluid intake to 48-64 ounces daily and sodium intake to less than 1800 mg/day.   Patient repeated instructions back correctly and verbalized understanding.    Advised to call back with any questions.

## 2020-10-23 NOTE — Progress Notes (Signed)
Deberah Pelton, NP  C[facility] Scheduling 9 minutes ago (11:19 AM)      Please schedule this patient an appointment with APP or Dr. Martinique in the next week. He may be seen in the office on [employer] or at [employer]. Thank you.     Deberah Pelton, NP  You 10 minutes ago (11:17 AM)    Salley Scarlet Margarita Grizzle,   I have reviewed Mr. Huy message. Given that he has gained 8 pounds since discharge. His prescribed furosemide regimen is not balanced with his fluid intake. We will double his furosemide for 3 days. We will also double his potassium for 3 days. After 3 days he will need to return to his current dosing with furosemide and potassium. Please schedule an appointment with him in the next week and have a BMP drawn in the next week. He needs to weigh himself daily and contact our office with a weight increase of 3 pounds overnight or 5 pounds in a week. He will need to limit his fluid intake to 48-64 ounces daily. He also needs to limit his sodium intake to less than 1800 mg/day. Thank you.

## 2020-10-23 NOTE — Progress Notes (Signed)
Remote ICD transmission.   

## 2020-10-27 ENCOUNTER — Ambulatory Visit (INDEPENDENT_AMBULATORY_CARE_PROVIDER_SITE_OTHER): Payer: Medicare Other

## 2020-10-27 DIAGNOSIS — I5022 Chronic systolic (congestive) heart failure: Secondary | ICD-10-CM | POA: Diagnosis not present

## 2020-10-27 DIAGNOSIS — Z95810 Presence of automatic (implantable) cardiac defibrillator: Secondary | ICD-10-CM

## 2020-10-27 NOTE — Progress Notes (Signed)
EPIC Encounter for ICM Monitoring  Patient Name: KEYRON POKORSKI is a 74 y.o. male Date: 10/27/2020 Primary Care Physican: [name], MD Primary [place] Electrophysiologist: Vergie Living Pacing: 97.6%  09/28/2020 Hospital discharge weight 176 lbs 11/5/2021Weight:184lbs   10/27/2020 Weight 176.4 lbs  Spoke with patient.  He reports breathing has improved and weight returned to baseline after doubling Furosemide and Potassium x 3 days as ordered by Coletta Memos NP.  He has returned to taking prescribed dosage of both.    Optivol thoracic impedancesuggesting significant improvement of fluid levels and returning close to baseline.  Prescribed:   Furosemide20 mgTake 1 tablet (20 mg ) twice a day with food.      Potassium 20 mEqTAKE 1 TABLET BY MOUTH DAILY. TAKE ADDITIONAL TABLET WHEN TAKING EXTRA LASIX.   Labs:  BMET scheduled for 10/30/2020 09/28/2020 Creatinine 1.56, BUN 21, Potassium 3.6, Sodium 142, GFR 44 09/27/2020 Creatinine 1.68, BUN 23, Potassium 3.4, Sodium 140, GFR 40  09/26/2020 Creatinine 1.52, BUN 21, Potassium 3.7, Sodium 142, GFR 45  09/25/2020 Creatinine 1.46, BUN 23, Potassium 3.0, Sodium 140  09/24/2020 Creatinine 1.47, BUN 21, Potassium 3.3, Sodium 142  09/23/2020 Creatinine 1.40, BUN 16, Potassium 3.8, Sodium 141  09/22/2020 Creatinine 1.32, BUN 21, Potassium 3.6, Sodium 142  09/21/2020 Creatinine 1.34, BUN 22, Potassium 3.4, Sodium 142  09/20/2020 Creatinine 1.06, BUN 21, Potassium 3.7, Sodium 141  A complete set of results can be found in Results Review.  Recommendations:Advised to continue to follow Denyse Amass Cleaver's recommendation to limit fluid intake to 48-64 oz daily and salt <1800 mg daily.  Advised to call back if he experiences any return of fluid symptoms.   Follow-up plan: ICM clinic phone appointment on12/12/2019 to recheck fluid levels before 12/2 OV. 91 day device clinic remote  transmission2/03/2021.  EP/Cardiology Office Visits: 11/19/2020 with Dr Martinique   Copy of ICM check sent to [place] and Coletta Memos, NP for [facility] on effectiveness of Furosemide increase x 3 days.   3 month ICM trend: 10/27/2020    1 Year ICM trend:       [name], RN 10/27/2020 11:55 AM

## 2020-10-30 ENCOUNTER — Other Ambulatory Visit: Payer: Self-pay

## 2020-10-30 DIAGNOSIS — I5022 Chronic systolic (congestive) heart failure: Secondary | ICD-10-CM

## 2020-10-30 LAB — BASIC METABOLIC PANEL
BUN/Creatinine Ratio: 18 (ref 10–24)
BUN: 24 mg/dL (ref 8–27)
CO2: 29 mmol/L (ref 20–29)
Calcium: 9.5 mg/dL (ref 8.6–10.2)
Chloride: 101 mmol/L (ref 96–106)
Creatinine, Ser: 1.34 mg/dL — ABNORMAL HIGH (ref 0.76–1.27)
GFR calc Af Amer: 61 mL/min/{1.73_m2} (ref >59)
GFR calc non Af Amer: 53 mL/min/{1.73_m2} — ABNORMAL LOW (ref >59)
Glucose: 90 mg/dL (ref 65–99)
Potassium: 4.3 mmol/L (ref 3.5–5.2)
Sodium: 142 mmol/L (ref 134–144)

## 2020-10-30 NOTE — Telephone Encounter (Signed)
Opened in error

## 2020-11-02 ENCOUNTER — Ambulatory Visit: Payer: Medicare Other | Admitting: Cardiology

## 2020-11-13 NOTE — Progress Notes (Deleted)
Cardiology Office Note    Date:  11/13/2020   ID:  Tin, Engram 21-Feb-1946, MRN [id number]  PCP:  [name], MD  Cardiologist:  Dr. Juanjose Mojica Martinique   No chief complaint on file.   History of Present Illness:  [name] is a 74 y.o. male with PMH of CAD s/p remote anterior MI, ICM s/p CRT-D, atrial fibrillation, and HLD. He had generator change placed in September 2014. He is intolerant of amiodarone. His QT interval was too long for Tikosyn. He previously attempted to increase his carvedilol dose, however developed significant hypotension.   In October 2017, he had volume overloaded indicated by his Optival reading, Lasix was increased to 40 mg twice a day. He was started on Entresto but developed hypotension, he was later transitioned back to losartan. Due to persistent atrial fibrillation with elevated heart rate, he underwent AV nodal ablation in February 2018.  He was admitted to the hospital on 09/30/2017 with renal insufficiency. He reported episode of weakness. He was also treated for SIRS and sepsis with temp 102 and tachypnea. White blood cell count was 16.6. Initial lactic acid 2.14. He was hydrated with IV fluid. Troponin was borderline elevated, this was felt to be related to elevation of creatinine up to 2.71. He was treated with vancomycin, Levaquin and aztreonam. Culture was negative. Repeat echocardiogram obtained on 10/01/2017 continues showed EF 25-30%, mild MR, mild-to-moderate TR, akinesis of the apical anterior, lateral, inferolateral, inferior, apical myocardium with no evidence of apical thrombus by Definity contrast, PA peak pressure 37 mmHg.  He was seen in July 2020 with some fluid accumulation. Lasix increased for 3 days. Last ICD check April 1 showed normal Optivol. He had generator change out in May. In July he was seen by Dr Fletcher Anon for abnormal LE dopplers with occluded SFA. No real symptoms or signs of critical limb ischemia so medical therapy  recommended.   He was admitted from 9/7-10/11/21 with COVID PNA. He was treated with remdisivir and steroids. Required high flow oxygen. Was diuresed aggressively. He had bilateral DVTs- peroneal veins and was switched from coumadin to Eliquis. CT was negative for PE. Losartan was held. He was in AFib with controlled response. Echo was unchanged with EF of 25-30%. Creatinine at discharge was 1.56. DC on lasix 20 mg bid which is significantly lower dose of diuretics than in the past.  On follow up today he is seen with his wife. He is doing better. Oxygen requirement down to 2 L/[place]. No cough. Breathing is improved. Mild swelling in right LE. Weight is down almost 30 lbs from before hospitalization. He is getting PT. Wife is making sure he is getting up every 2 hours. Strict sodium restriction.       Past Medical History:  Diagnosis Date  . AICD (automatic cardioverter/defibrillator) present   . Anemia   . Anxiety   . CHF (congestive heart failure) ([place])   . Chronic renal insufficiency   . Coronary artery disease   . Depression   . Dyslipidemia   . Dysrhythmia    atrial fibrillation  . ED (erectile dysfunction)   . GERD (gastroesophageal reflux disease)   . Incisional hernia, without obstruction or gangrene 04/10/2017  . Ischemic cardiomyopathy    EF 23%  . MI (myocardial infarction) ([place]) 2000   ANTERIOR, s/p PCI  . Other primary cardiomyopathies   . Peripheral vascular disease ([place])    dvt legs 2017  . Persistent atrial fibrillation ([place])  on coumadin  . Presence of permanent cardiac pacemaker   . Shortness of breath dyspnea    on exertion    Past Surgical History:  Procedure Laterality Date  . AV NODE ABLATION N/A 01/25/2017   Procedure: AV Node Ablation;  Surgeon: [name], MD;  Location: [place] CV LAB;  Service: Cardiovascular;  Laterality: N/A;  . AV NODE ABLATION  01/25/2017  . BIV ICD GENERATOR CHANGEOUT N/A 04/24/2020   Procedure: BIV ICD GENERATOR  CHANGEOUT;  Surgeon: [name], MD;  Location: [place] CV LAB;  Service: Cardiovascular;  Laterality: N/A;  . BIV ICD GENERTAOR CHANGE OUT N/A 09/16/2013   Procedure: BIV ICD GENERTAOR CHANGE OUT;  Surgeon: [name], MD;  [facility] CATH LAB;  Service: Cardiovascular;  Laterality: N/A;  . BREAST LUMPECTOMY Left   . CARDIAC CATHETERIZATION  02/05/2008   MODERATE TO SEVERE LEFT VENTRICULAR  DYSFUNCTION WITH EF 25-30%  . CARDIAC DEFIBRILLATOR PLACEMENT  2009   MDT BI-VENTRICULAR ICD by Dr Caryl Comes  . COLONOSCOPY WITH PROPOFOL N/A 08/31/2016   Procedure: COLONOSCOPY WITH PROPOFOL;  Surgeon: Wilford Corner, MD;  Location: [facility] ENDOSCOPY;  Service: Endoscopy;  Laterality: N/A;  . CORONARY ANGIOPLASTY  2000   stent  . CORONARY STENT PLACEMENT     LAD  . CYSTOSCOPY N/A 01/25/2016   Procedure: CYSTOSCOPY;  Surgeon: Carolan Clines, MD;  Location: [facility] ORS;  Service: Urology;  Laterality: N/A;  . ESOPHAGOGASTRODUODENOSCOPY (EGD) WITH PROPOFOL N/A 08/31/2016   Procedure: ESOPHAGOGASTRODUODENOSCOPY (EGD) WITH PROPOFOL;  Surgeon: Wilford Corner, MD;  Location: [facility] ENDOSCOPY;  Service: Endoscopy;  Laterality: N/A;  . FRACTURE SURGERY Right 1972   leg  . IMPLANTABLE CARDIOVERTER DEFIBRILLATOR GENERATOR CHANGE  2014  . INCISIONAL HERNIA REPAIR N/A 04/10/2017   Procedure: OPEN REPAIR INCISIONAL VENTRAL HERNIA;  Surgeon: [name], MD;  Location: [place];  Service: General;  Laterality: N/A;  . INSERT / REPLACE / REMOVE PACEMAKER     ICD  . INSERTION OF MESH N/A 04/10/2017   Procedure: INSERTION OF MESH;  Surgeon: [name], MD;  Location: [place];  Service: General;  Laterality: N/A;  . LAPAROTOMY N/A 02/22/2016   Procedure: EXPLORATORY LAPAROTOMY WITH  PATCH OF DUODENAL ULCER;  Surgeon: Rolm Bookbinder, MD;  Location: [place];  Service: General;  Laterality: N/A;  . MULTIPLE TOOTH EXTRACTIONS    . TRANSTHORACIC ECHOCARDIOGRAM  12/2010   EF 30-35%  . TRANSURETHRAL RESECTION OF PROSTATE N/A  01/25/2016   Procedure: TRANSURETHRAL RESECTION OF THE PROSTATE (TURP);  Surgeon: Carolan Clines, MD;  Location: [facility] ORS;  Service: Urology;  Laterality: N/A;  . WRIST FRACTURE SURGERY Left 1972    Current Medications: Outpatient Medications Prior to Visit  Medication Sig Dispense Refill  . acetaminophen (TYLENOL) 500 MG tablet Take 500 mg by mouth every 8 (eight) hours as needed for mild pain or moderate pain.     Marland Kitchen albuterol (VENTOLIN HFA) 108 (90 Base) MCG/ACT inhaler Inhale 2 puffs into the lungs every 4 (four) hours as needed for wheezing or shortness of breath. 8 g 0  . apixaban (ELIQUIS) 5 MG TABS tablet Take 1 tablet (5 mg total) by mouth 2 (two) times daily. 180 tablet 2  . digoxin (LANOXIN) 0.125 MG tablet Take 1 tablet (0.125 mg total) by mouth daily. (Patient taking differently: Take 0.0625 mg by mouth every other day. ) 90 tablet 3  . fenofibrate 160 MG tablet TAKE 1 TABLET BY MOUTH  DAILY 90 tablet 3  . ferrous  sulfate 325 (65 FE) MG tablet Take 325 mg by mouth 2 (two) times daily.    . folic acid (FOLVITE) 301 MCG tablet Take 800 mcg by mouth every evening.     . furosemide (LASIX) 20 MG tablet Take 1 tablet (20 mg total) by mouth 2 (two) times daily. with food 60 tablet 0  . losartan (COZAAR) 25 MG tablet Take 1 tablet (25 mg total) by mouth daily. 90 tablet 3  . Magnesium 250 MG TABS Take 500 mg by mouth daily.    . metoprolol succinate (TOPROL-XL) 25 MG 24 hr tablet TAKE 1 TABLET BY MOUTH  DAILY AT 12 NOON. 90 tablet 3  . Multiple Vitamins-Minerals (MULTIVITAMIN ADULTS PO) Take 1 tablet by mouth daily. Senior    . nitroGLYCERIN (NITROSTAT) 0.4 MG SL tablet Place 1 tablet (0.4 mg total) under the tongue every 5 (five) minutes as needed for chest pain. 25 tablet 11  . pantoprazole (PROTONIX) 40 MG tablet TAKE 2 TABLETS BY MOUTH  DAILY (Patient taking differently: Take 40 mg by mouth 2 (two) times daily. ) 180 tablet 3  . potassium chloride SA (KLOR-CON) 20 MEQ tablet TAKE 1  TABLET BY MOUTH  DAILY. TAKE ADDITIONAL  TABLET WHEN TAKING EXTRA  LASIX (FUROSEMIDE) (Patient taking differently: Take 70 mEq by mouth. ) 135 tablet 3  . pravastatin (PRAVACHOL) 40 MG tablet TAKE 1 TABLET BY MOUTH  DAILY (Patient taking differently: Take 40 mg by mouth daily. ) 90 tablet 3  . sertraline (ZOLOFT) 100 MG tablet Take 150 mg by mouth daily at 2 PM.     . vitamin C (ASCORBIC ACID) 250 MG tablet Take 250 mg by mouth 2 (two) times daily.     No facility-administered medications prior to visit.     Allergies:   Keflex [cephalexin]   Social History   Socioeconomic History  . Marital status: Married    Spouse name: Not on file  . Number of children: 2  . Years of education: Not on file  . Highest education level: Not on file  Occupational History  . Occupation: disabled    Employer: RETIRED  Tobacco Use  . Smoking status: Former Smoker    Types: Cigarettes    Quit date: 12/23/1998    Years since quitting: 21.9  . Smokeless tobacco: Never Used  Vaping Use  . Vaping Use: Never used  Substance and Sexual Activity  . Alcohol use: No  . Drug use: No  . Sexual activity: Not on file  Other Topics Concern  . Not on file  Social History Narrative   Lives in [place].  Retired Teacher, English as a foreign language         Social Determinants of Radio broadcast assistant Strain:   . Difficulty of Paying Living Expenses: Not on file  Food Insecurity:   . Worried About Charity fundraiser in the Last Year: Not on file  . Ran Out of Food in the Last Year: Not on file  Transportation Needs:   . Lack of Transportation (Medical): Not on file  . Lack of Transportation (Non-Medical): Not on file  Physical Activity:   . Days of Exercise per Week: Not on file  . Minutes of Exercise per Session: Not on file  Stress:   . Feeling of Stress : Not on file  Social Connections:   . Frequency of Communication with Friends and Family: Not on file  . Frequency of Social Gatherings with Friends and  Family: Not on  file  . Attends Religious Services: Not on file  . Active Member of Clubs or Organizations: Not on file  . Attends Archivist Meetings: Not on file  . Marital Status: Not on file     Family History:  The patient's family history includes Breast cancer in his mother and sister; Depression in his mother; Heart attack in his father.   ROS:   Please see the history of present illness.    ROS All other systems reviewed and are negative.   PHYSICAL EXAM:   VS:  There were no vitals taken for this visit.   GENERAL:  Well appearing  WM in NAD. On oxygen HEENT:  PERRL, EOMI, sclera are clear. Oropharynx is clear. NECK:  No jugular venous distention, carotid upstroke brisk and symmetric, no bruits, no thyromegaly or adenopathy LUNGS:  Clear to auscultation bilaterally CHEST:  Unremarkable HEART:  IRRR,  PMI not displaced or sustained,S1 and S2 within normal limits, no S3, no S4: no clicks, no rubs, no murmurs ABD:  Soft, nontender. BS +, no masses or bruits. No hepatomegaly, no splenomegaly EXT:  2 + pulses throughout, trace right pretibial  edema, no cyanosis no clubbing. SKIN:  Warm and dry.  No rashes NEURO:  Alert and oriented x 3. Cranial nerves II through XII intact. PSYCH:  Cognitively intact   Wt Readings from Last 3 Encounters:  10/08/20 182 lb (82.6 kg)  09/28/20 176 lb (79.8 kg)  08/04/20 213 lb (96.6 kg)      Studies/Labs Reviewed:   EKG:  EKG is not ordered today.     Recent Labs: 08/25/2020: B Natriuretic Peptide 429.0 09/03/2020: ALT 11 09/26/2020: Hemoglobin 11.5; Platelets 385 09/28/2020: Magnesium 2.1 10/30/2020: BUN 24; Creatinine, Ser 1.34; Potassium 4.3; Sodium 142   Lipid Panel    Component Value Date/Time   CHOL 112 02/27/2016 0500   TRIG 139 08/26/2020 1200   HDL 32 (L) 02/27/2016 0500   CHOLHDL 3.5 02/27/2016 0500   VLDL 14 02/27/2016 0500   LDLCALC 66 02/27/2016 0500   LDLDIRECT 137.6 01/06/2012 1029   Labs dated 05/01/18:  BUN 41, creatinine 1.72. Other chemistries normal. Dig level 0.2. WBC 7.7. Hgb 11.8. Dated 10/08/18: INR 2.3. Hgb 11.8. BUN 32, creatinine 1.48. Other chemistries normal.  Dated 12/20/18: Dig level < 0.4. Dated 04/29/19: Hgb 12.1. BUN 30, creatinine 1.47. other chemistries normal Dated 10/05/20: Hgb 12.2. BUN 19, creatinine 1.47. potassium 4.7. sodium 143.   Additional studies/ records that were reviewed today include:   Echo 10/01/2017 LV EF: 25% -   30%  Study Conclusions  - Left ventricle: The cavity size was normal. Systolic function was   severely reduced. The estimated ejection fraction was in the   range of 25% to 30%. There is akinesis of the apical anterior,   lateral, inferolateral, inferior, and apical myocardium with no   evidence of apical thrombus by definity contrast. The study is   not technically sufficient to allow evaluation of LV diastolic   function. Doppler parameters are consistent with high ventricular   filling pressure. - Mitral valve: There was mild regurgitation. - Left atrium: The atrium was mildly dilated. - Tricuspid valve: There was mild-moderate regurgitation. - Pulmonic valve: There was trivial regurgitation. - Pulmonary arteries: PA peak pressure: 37 mm Hg (S).  Impressions:  - The right ventricular systolic pressure was increased consistent   with mild pulmonary hypertension.   Echo 09/17/20: IMPRESSIONS    1. There is akinesis of all mid-to-distal  septal, mid-to-distal inferior,  mid-to-distal anterior and apical segments. All other segments are  severely hypokinetic.Marland Kitchen Left ventricular ejection fraction, by estimation,  is 25 to 30%. The left ventricle has  severely decreased function. The left ventricular internal cavity size was  mildly dilated.  2. Right ventricular systolic function is normal. The right ventricular  size is normal. There is mildly elevated pulmonary artery systolic  pressure.  3. The aortic valve is tricuspid.  There is mild calcification of the  aortic valve. There is mild thickening of the aortic valve. Aortic valve  regurgitation is not visualized.   Comparison(s): No significant change from prior study. Compared to prior  TTE in 2018, there is no significant change.   ASSESSMENT:    No diagnosis found.   PLAN:  In order of problems listed above:  1. Chronic systolic heart failure: EF 25-30%. weight is down considerably from recent Covid infection. On lower dose of lasix. Appears euvolemic. Will resume losartan at 25 mg daily since renal function is at baseline. Was 1.6 prior to hospital stay. Continue sodium and fluid restriction. Repeat BMET in a couple of weeks. Follow up with me in 2 months.  2. ICM s/p ICD:   Device check in August with  good with BiV pacing.   3. CAD: Denies any anginal symptoms.  4. COVID 19 PNA- improving. Hopefully with more time for recovery and PT will be able to wean oxygen.  5. Persistent atrial fibrillation: He had AV nodal ablation in early 2018.  Continue Eliquis. Rate is controlled. BiV paced.   6. CKD stage 3. Back at baseline. Will monitor with resumption of losartan  7.   Bilateral peroneal vein DVT. Secondary to immobility and COVID 19 infection. Continue Eliquis indefinitely due to Afib. He asked about an IVC filter and there is no indication for this.   8.   Hypercholesterolemia on Pravastatin.    Medication Adjustments/Labs and Tests Ordered: Current medicines are reviewed at length with the patient today.  Concerns regarding medicines are outlined above.  Medication changes, Labs and Tests ordered today are listed in the Patient Instructions below. There are no Patient Instructions on file for this visit.   Signed, Levone Otten Martinique, MD  11/13/2020 1:09 PM    [facility] [place], [place], [place]  [address] Phone: [phone]; Fax: [phone]

## 2020-11-16 ENCOUNTER — Telehealth: Payer: Self-pay

## 2020-11-16 ENCOUNTER — Telehealth: Payer: Self-pay | Admitting: Cardiovascular Disease

## 2020-11-16 NOTE — Telephone Encounter (Signed)
Called to schedule patient's follow up, was informed that patient is deceased.

## 2020-11-16 NOTE — Telephone Encounter (Signed)
Alert for shock therapy delivered. 1 treated event on 10/24/2020 logged 1 minute 5 seconds w/ rate 230's bpm successfully terminated by a single 30 J shock. Optivol elevated and thoracic impedance below reference line and trending down.   Spoke with pt spouse, she reports pt passed away yesterday.  She requested what to do with remote monitor, advised I will have a return kit mailed out to home address.    Forwarding to MD to notify of pt death.

## 2020-11-18 DEATH — deceased

## 2020-11-19 ENCOUNTER — Ambulatory Visit: Payer: Medicare Other | Admitting: Cardiology
